# Patient Record
Sex: Male | Born: 1945 | Race: White | Hispanic: No | Smoking: Never smoker
Health system: Southern US, Community
[De-identification: ages and names within clinical notes are randomized; demographics above are authoritative.]

## PROBLEM LIST (undated history)

## (undated) DIAGNOSIS — G629 Polyneuropathy, unspecified: Secondary | ICD-10-CM

## (undated) DIAGNOSIS — G20 Parkinson's disease: Secondary | ICD-10-CM

## (undated) DIAGNOSIS — I679 Cerebrovascular disease, unspecified: Secondary | ICD-10-CM

## (undated) DIAGNOSIS — N183 Chronic kidney disease, stage 3 unspecified: Secondary | ICD-10-CM

## (undated) DIAGNOSIS — I482 Chronic atrial fibrillation, unspecified: Secondary | ICD-10-CM

## (undated) DIAGNOSIS — G250 Essential tremor: Secondary | ICD-10-CM

## (undated) DIAGNOSIS — G9341 Metabolic encephalopathy: Secondary | ICD-10-CM

## (undated) DIAGNOSIS — F319 Bipolar disorder, unspecified: Secondary | ICD-10-CM

## (undated) DIAGNOSIS — J9621 Acute and chronic respiratory failure with hypoxia: Secondary | ICD-10-CM

## (undated) DIAGNOSIS — J1282 Pneumonia due to coronavirus disease 2019: Secondary | ICD-10-CM

## (undated) DIAGNOSIS — E119 Type 2 diabetes mellitus without complications: Secondary | ICD-10-CM

## (undated) DIAGNOSIS — E785 Hyperlipidemia, unspecified: Secondary | ICD-10-CM

## (undated) DIAGNOSIS — N400 Enlarged prostate without lower urinary tract symptoms: Secondary | ICD-10-CM

## (undated) DIAGNOSIS — I471 Supraventricular tachycardia: Secondary | ICD-10-CM

## (undated) DIAGNOSIS — U071 COVID-19: Secondary | ICD-10-CM

## (undated) HISTORY — PX: OTHER SURGICAL HISTORY: SHX169

## (undated) HISTORY — PX: APPENDECTOMY: SHX54

---

## 2005-07-03 ENCOUNTER — Emergency Department: Payer: Self-pay | Admitting: Emergency Medicine

## 2010-09-27 ENCOUNTER — Emergency Department: Payer: Self-pay | Admitting: Emergency Medicine

## 2010-11-12 ENCOUNTER — Emergency Department: Payer: Self-pay | Admitting: Emergency Medicine

## 2012-04-24 ENCOUNTER — Emergency Department: Payer: Self-pay | Admitting: Emergency Medicine

## 2012-04-25 LAB — LITHIUM LEVEL: Lithium: <0.2 mmol/L — ABNORMAL LOW

## 2012-04-25 LAB — DRUG SCREEN, URINE
Amphetamines, Ur Screen: NEGATIVE (ref ?–1000)
Barbiturates, Ur Screen: NEGATIVE (ref ?–200)
Cocaine Metabolite,Ur ~~LOC~~: NEGATIVE (ref ?–300)
Opiate, Ur Screen: NEGATIVE (ref ?–300)
Tricyclic, Ur Screen: NEGATIVE (ref ?–1000)

## 2012-04-25 LAB — CBC
HCT: 39.2 % — ABNORMAL LOW (ref 40.0–52.0)
HGB: 13.2 g/dL (ref 13.0–18.0)
MCH: 32.8 pg (ref 26.0–34.0)
MCHC: 33.7 g/dL (ref 32.0–36.0)
RBC: 4.03 10*6/uL — ABNORMAL LOW (ref 4.40–5.90)
RDW: 12.6 % (ref 11.5–14.5)

## 2012-04-25 LAB — COMPREHENSIVE METABOLIC PANEL
Albumin: 3.8 g/dL (ref 3.4–5.0)
BUN: 7 mg/dL (ref 7–18)
Chloride: 103 mmol/L (ref 98–107)
Co2: 21 mmol/L (ref 21–32)
Creatinine: 0.67 mg/dL (ref 0.60–1.30)
EGFR (Non-African Amer.): >60
Glucose: 180 mg/dL — ABNORMAL HIGH (ref 65–99)

## 2012-04-25 LAB — ETHANOL: Ethanol %: 0.073 % (ref 0.000–0.080)

## 2012-04-25 LAB — ACETAMINOPHEN LEVEL: Acetaminophen: <2 ug/mL

## 2012-04-26 LAB — COMPREHENSIVE METABOLIC PANEL
Albumin: 3.5 g/dL (ref 3.4–5.0)
Chloride: 106 mmol/L (ref 98–107)
EGFR (Non-African Amer.): >60
SGOT(AST): 25 U/L (ref 15–37)
SGPT (ALT): 29 U/L

## 2012-04-26 LAB — CBC WITH DIFFERENTIAL/PLATELET
Basophil #: 0.1 10*3/uL (ref 0.0–0.1)
Eosinophil #: 0.6 10*3/uL (ref 0.0–0.7)
Eosinophil %: 5.1 %
HCT: 37.3 % — ABNORMAL LOW (ref 40.0–52.0)
HGB: 12.8 g/dL — ABNORMAL LOW (ref 13.0–18.0)
Lymphocyte #: 2.7 10*3/uL (ref 1.0–3.6)
Lymphocyte %: 25.1 %
MCH: 34.2 pg — ABNORMAL HIGH (ref 26.0–34.0)
MCHC: 34.4 g/dL (ref 32.0–36.0)
MCV: 99 fL (ref 80–100)
Monocyte #: 0.7 x10 3/mm (ref 0.2–1.0)
Monocyte %: 6.1 %
Neutrophil #: 6.9 10*3/uL — ABNORMAL HIGH (ref 1.4–6.5)
RBC: 3.76 10*6/uL — ABNORMAL LOW (ref 4.40–5.90)
WBC: 10.9 10*3/uL — ABNORMAL HIGH (ref 3.8–10.6)

## 2012-04-26 LAB — TROPONIN I
Troponin-I: <0.02 ng/mL
Troponin-I: <0.02 ng/mL

## 2012-04-28 LAB — LITHIUM LEVEL: Lithium: 0.65 mmol/L

## 2012-05-30 ENCOUNTER — Emergency Department: Payer: Self-pay | Admitting: Emergency Medicine

## 2012-05-30 LAB — COMPREHENSIVE METABOLIC PANEL
Alkaline Phosphatase: 75 U/L (ref 50–136)
BUN: 5 mg/dL — ABNORMAL LOW (ref 7–18)
Bilirubin,Total: 0.5 mg/dL (ref 0.2–1.0)
Co2: 23 mmol/L (ref 21–32)
Creatinine: 0.83 mg/dL (ref 0.60–1.30)
EGFR (African American): >60
EGFR (Non-African Amer.): >60
Glucose: 96 mg/dL (ref 65–99)
SGOT(AST): 21 U/L (ref 15–37)
SGPT (ALT): 23 U/L
Total Protein: 7.4 g/dL (ref 6.4–8.2)

## 2012-05-30 LAB — ETHANOL
Ethanol %: 0.1 % — ABNORMAL HIGH (ref 0.000–0.080)
Ethanol: 100 mg/dL

## 2012-05-30 LAB — SALICYLATE LEVEL: Salicylates, Serum: 3.4 mg/dL — ABNORMAL HIGH

## 2012-05-30 LAB — DRUG SCREEN, URINE
Barbiturates, Ur Screen: NEGATIVE (ref ?–200)
Benzodiazepine, Ur Scrn: NEGATIVE (ref ?–200)
Cannabinoid 50 Ng, Ur ~~LOC~~: NEGATIVE (ref ?–50)
Cocaine Metabolite,Ur ~~LOC~~: NEGATIVE (ref ?–300)
Methadone, Ur Screen: NEGATIVE (ref ?–300)
Phencyclidine (PCP) Ur S: NEGATIVE (ref ?–25)
Tricyclic, Ur Screen: NEGATIVE (ref ?–1000)

## 2012-05-30 LAB — CBC
MCHC: 34.3 g/dL (ref 32.0–36.0)
RDW: 12.7 % (ref 11.5–14.5)

## 2012-05-31 LAB — LITHIUM LEVEL: Lithium: 0.41 mmol/L — ABNORMAL LOW

## 2012-06-01 LAB — URINALYSIS, COMPLETE
Blood: NEGATIVE
Nitrite: NEGATIVE
Ph: 6 (ref 4.5–8.0)
Protein: NEGATIVE
WBC UR: 5 /HPF (ref 0–5)

## 2012-06-10 LAB — LITHIUM LEVEL: Lithium: 0.73 mmol/L

## 2012-08-23 ENCOUNTER — Ambulatory Visit: Payer: Self-pay | Admitting: Family Medicine

## 2012-12-24 ENCOUNTER — Emergency Department (HOSPITAL_COMMUNITY)
Admission: EM | Admit: 2012-12-24 | Discharge: 2012-12-24 | Disposition: A | Discharge status: Home / Self Care | Payer: Medicare Other | Attending: Emergency Medicine | Admitting: Emergency Medicine

## 2012-12-24 ENCOUNTER — Encounter (HOSPITAL_COMMUNITY): Payer: Self-pay | Admitting: *Deleted

## 2012-12-24 ENCOUNTER — Emergency Department (HOSPITAL_COMMUNITY): Payer: Medicare Other

## 2012-12-24 DIAGNOSIS — Y939 Activity, unspecified: Secondary | ICD-10-CM | POA: Insufficient documentation

## 2012-12-24 DIAGNOSIS — F17200 Nicotine dependence, unspecified, uncomplicated: Secondary | ICD-10-CM | POA: Insufficient documentation

## 2012-12-24 DIAGNOSIS — R05 Cough: Secondary | ICD-10-CM

## 2012-12-24 DIAGNOSIS — F319 Bipolar disorder, unspecified: Secondary | ICD-10-CM | POA: Insufficient documentation

## 2012-12-24 DIAGNOSIS — IMO0002 Reserved for concepts with insufficient information to code with codable children: Secondary | ICD-10-CM | POA: Insufficient documentation

## 2012-12-24 DIAGNOSIS — Y929 Unspecified place or not applicable: Secondary | ICD-10-CM | POA: Insufficient documentation

## 2012-12-24 DIAGNOSIS — S060X9A Concussion with loss of consciousness of unspecified duration, initial encounter: Secondary | ICD-10-CM | POA: Insufficient documentation

## 2012-12-24 DIAGNOSIS — J3489 Other specified disorders of nose and nasal sinuses: Secondary | ICD-10-CM | POA: Insufficient documentation

## 2012-12-24 DIAGNOSIS — R093 Abnormal sputum: Secondary | ICD-10-CM | POA: Insufficient documentation

## 2012-12-24 DIAGNOSIS — S298XXA Other specified injuries of thorax, initial encounter: Secondary | ICD-10-CM | POA: Insufficient documentation

## 2012-12-24 DIAGNOSIS — R0789 Other chest pain: Secondary | ICD-10-CM

## 2012-12-24 DIAGNOSIS — Z8719 Personal history of other diseases of the digestive system: Secondary | ICD-10-CM | POA: Insufficient documentation

## 2012-12-24 DIAGNOSIS — S0993XA Unspecified injury of face, initial encounter: Secondary | ICD-10-CM | POA: Insufficient documentation

## 2012-12-24 DIAGNOSIS — R059 Cough, unspecified: Secondary | ICD-10-CM | POA: Insufficient documentation

## 2012-12-24 DIAGNOSIS — Z79899 Other long term (current) drug therapy: Secondary | ICD-10-CM | POA: Insufficient documentation

## 2012-12-24 DIAGNOSIS — Z7982 Long term (current) use of aspirin: Secondary | ICD-10-CM | POA: Insufficient documentation

## 2012-12-24 HISTORY — DX: Essential tremor: G25.0

## 2012-12-24 HISTORY — DX: Bipolar disorder, unspecified: F31.9

## 2012-12-24 MED ORDER — AZITHROMYCIN 250 MG PO TABS: 250.0000 mg | ORAL_TABLET | Freq: Every day | ORAL | Status: AC

## 2012-12-24 MED ORDER — HYDROCODONE-HOMATROPINE 5-1.5 MG/5ML PO SYRP: 5.0000 mL | ORAL_SOLUTION | Freq: Four times a day (QID) | ORAL | Status: DC | PRN

## 2012-12-24 MED ORDER — AZITHROMYCIN 250 MG PO TABS
500.0000 mg | ORAL_TABLET | Freq: Once | ORAL | Status: DC
  Filled 2012-12-24 (×2): qty 2
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
## 2012-12-24 NOTE — ED Notes (Signed)
Pt states productive cough x 1 week. States pain to left rib area. ALso states he has been having headaches which is new for him.

## 2014-04-04 ENCOUNTER — Inpatient Hospital Stay: Payer: Self-pay | Admitting: Psychiatry

## 2014-04-04 LAB — ETHANOL: Ethanol %: <0.003 % (ref 0.000–0.080)

## 2014-04-04 LAB — COMPREHENSIVE METABOLIC PANEL
ALBUMIN: 3.4 g/dL (ref 3.4–5.0)
ALK PHOS: 66 U/L
ANION GAP: 5 — AB (ref 7–16)
BUN: 9 mg/dL (ref 7–18)
Bilirubin,Total: 0.2 mg/dL (ref 0.2–1.0)
CALCIUM: 8.9 mg/dL (ref 8.5–10.1)
CHLORIDE: 107 mmol/L (ref 98–107)
CO2: 26 mmol/L (ref 21–32)
CREATININE: 0.9 mg/dL (ref 0.60–1.30)
EGFR (Non-African Amer.): >60
GLUCOSE: 128 mg/dL — AB (ref 65–99)
Osmolality: 276 (ref 275–301)
POTASSIUM: 3.7 mmol/L (ref 3.5–5.1)
SGOT(AST): 32 U/L (ref 15–37)
SGPT (ALT): 28 U/L (ref 12–78)
Sodium: 138 mmol/L (ref 136–145)
Total Protein: 7.2 g/dL (ref 6.4–8.2)

## 2014-04-04 LAB — SALICYLATE LEVEL
SALICYLATES, SERUM: 5.4 mg/dL — AB
Salicylates, Serum: 10.4 mg/dL — ABNORMAL HIGH

## 2014-04-04 LAB — DRUG SCREEN, URINE
Amphetamines, Ur Screen: NEGATIVE (ref ?–1000)
BARBITURATES, UR SCREEN: NEGATIVE (ref ?–200)
BENZODIAZEPINE, UR SCRN: NEGATIVE (ref ?–200)
Cannabinoid 50 Ng, Ur ~~LOC~~: NEGATIVE (ref ?–50)
Cocaine Metabolite,Ur ~~LOC~~: NEGATIVE (ref ?–300)
MDMA (ECSTASY) UR SCREEN: NEGATIVE (ref ?–500)
Methadone, Ur Screen: NEGATIVE (ref ?–300)
Opiate, Ur Screen: NEGATIVE (ref ?–300)
Phencyclidine (PCP) Ur S: NEGATIVE (ref ?–25)
TRICYCLIC, UR SCREEN: NEGATIVE (ref ?–1000)

## 2014-04-04 LAB — ACETAMINOPHEN LEVEL

## 2014-04-04 LAB — TSH: Thyroid Stimulating Horm: 0.45 u[IU]/mL

## 2014-04-04 LAB — CBC
HCT: 37.3 % — ABNORMAL LOW (ref 40.0–52.0)
HGB: 12.4 g/dL — ABNORMAL LOW (ref 13.0–18.0)
MCH: 31.9 pg (ref 26.0–34.0)
MCHC: 33.3 g/dL (ref 32.0–36.0)
MCV: 96 fL (ref 80–100)
Platelet: 278 10*3/uL (ref 150–440)
RBC: 3.9 10*6/uL — ABNORMAL LOW (ref 4.40–5.90)
RDW: 13 % (ref 11.5–14.5)
WBC: 11.9 10*3/uL — ABNORMAL HIGH (ref 3.8–10.6)

## 2014-04-04 LAB — LITHIUM LEVEL: Lithium: 0.79 mmol/L

## 2014-04-07 LAB — LITHIUM LEVEL: Lithium: 0.54 mmol/L — ABNORMAL LOW

## 2014-04-15 LAB — LITHIUM LEVEL: LITHIUM: 1.33 mmol/L — AB

## 2014-04-16 LAB — LITHIUM LEVEL: Lithium: 0.53 mmol/L — ABNORMAL LOW
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
## 2015-03-16 NOTE — Discharge Summary (Signed)
PATIENT NAME:  Kyle [name], Kyle [name] MR#:  [name] DATE OF BIRTH:  [birth year]  DATE OF ADMISSION:  04/04/2014 DATE OF DISCHARGE:  04/18/2014  DATE OF CONSULTATION:  04/18/2014.   HOSPITAL COURSE:  See dictated history and physical for details of admission. This 69 year old man with a history of bipolar disorder was petitioned to the Emergency Room by his family because they reported he had started having more psychotic symptoms and agitation. When first seen in the Emergency Room, the patient had multiple symptoms of mania including grandiosity, hyperverbality, disorganized thinking. He was admitted to the hospital and treated with medication, as well as individual and group psychotherapy. His insight remained poor for the most part, as he did not see that he ever needed to be in the hospital. He persisted in stating that he intended to leave his wife, but gradually showed resolution of his other symptoms. He showed resolution of his grandiosity. Hypersexuality toned down and then disappeared. He is no longer hyperverbal or pressured and not making bizarre statements. He has been compliant with medication, although he was disagreeable about his hospital stay at times. He has not been violent or aggressive. I have tried my best to counsel him about what I have seen in him that suggests a diagnosis of mania. Gradually, I think his insight has improved. At this time, he is still saying that he thinks his relationship with his life is over, but he is willing to go back home to [place] for the time being and will follow up with his outpatient provider, [facility]. We have largely kept him on his lithium and Seroquel that he had been taking on admission. He briefly had an elevated lithium level at 1.3 resulting in backing down off the dose. Now, he is no longer having any side effects of toxicity.   MENTAL STATUS EXAMINATION AT DISCHARGE: Neatly dressed and groomed man, looks his stated age, cooperative  with the interview. Eye contact good. Psychomotor activity calm. Speech normal rate, tone and volume. Affect a little blunted. Mood stated as being fine. Thoughts are normal in rate; not racing, not grandiose. No bizarre thinking. Denies hallucinations. Denies suicidal or homicidal ideation. Alert and oriented x 4. Judgment and insight improved. Short and long-term memory intact.   DISCHARGE MEDICATIONS:  Propranolol 60 mg once a day, Zyrtec 10 mg once a day, lithium 600 mg at night, quetiapine 600 mg at night, docusate 200 mg twice a day, MiraLAX 17 grams once a day.   LABORATORY RESULTS:  The most recent lithium level was on the 25th and was 0.53. That was the day after we had stopped lithium. It has been restarted since then. He had a top lithium level of 1.3 on May 24th.  Admission labs included drug screen negative. Lithium level 0.79. TSH 0.45. Alcohol negative. Chemistry panel: Slightly elevated glucose of 128. Hematology panel: Elevated white count of 11.9, low hemoglobin and hematocrit.   DISPOSITION:  [place] back to his home [place]. Follow up with [facility].   DIAGNOSIS, PRINCIPAL AND PRIMARY:  AXIS I:  Bipolar disorder type I, manic.   SECONDARY DIAGNOSES: AXIS I:  No further.  AXIS II:  No diagnosis.  AXIS III:  Seasonal allergies, constipation, chronic tremor.  AXIS IV:  Moderate to severe from stress within his family.  AXIS V:  Functioning at time of discharge 56.     ____________________________ Audery AmelJohn T. Ginette Bradway, MD jtc:dmm [name]: 04/18/2014 14:59:24 ET T: 04/18/2014 22:00:41 ET JOB#: [id number]  cc: Audery Amel, MD, <Dictator> Audery Amel MD ELECTRONICALLY SIGNED 05/09/2014 13:20
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
## 2015-04-16 ENCOUNTER — Other Ambulatory Visit: Payer: Self-pay

## 2015-04-16 ENCOUNTER — Encounter: Payer: Self-pay | Admitting: Emergency Medicine

## 2015-04-16 ENCOUNTER — Emergency Department: Payer: Medicare Other

## 2015-04-16 ENCOUNTER — Emergency Department: Payer: Medicare Other | Attending: Student

## 2015-04-16 ENCOUNTER — Emergency Department
Admission: EM | Admit: 2015-04-16 | Discharge: 2015-04-18 | Disposition: A | Discharge status: Other Acute Inpatient Hospital | Payer: Self-pay | Attending: Student | Admitting: Student

## 2015-04-16 DIAGNOSIS — F3112 Bipolar disorder, current episode manic without psychotic features, moderate: Secondary | ICD-10-CM | POA: Insufficient documentation

## 2015-04-16 DIAGNOSIS — G8929 Other chronic pain: Secondary | ICD-10-CM | POA: Insufficient documentation

## 2015-04-16 DIAGNOSIS — I10 Essential (primary) hypertension: Secondary | ICD-10-CM

## 2015-04-16 DIAGNOSIS — M549 Dorsalgia, unspecified: Secondary | ICD-10-CM | POA: Insufficient documentation

## 2015-04-16 LAB — CBC WITH DIFFERENTIAL/PLATELET
BASOS ABS: 0 10*3/uL (ref 0–0.1)
Basophils Relative: 0 %
Eosinophils Absolute: 0 10*3/uL (ref 0–0.7)
Eosinophils Relative: 0 %
HCT: 39.5 % — ABNORMAL LOW (ref 40.0–52.0)
HEMOGLOBIN: 13.6 g/dL (ref 13.0–18.0)
LYMPHS ABS: 1.9 10*3/uL (ref 1.0–3.6)
LYMPHS PCT: 13 %
MCH: 32.7 pg (ref 26.0–34.0)
MCHC: 34.4 g/dL (ref 32.0–36.0)
MCV: 95 fL (ref 80.0–100.0)
Monocytes Absolute: 1.3 10*3/uL — ABNORMAL HIGH (ref 0.2–1.0)
Monocytes Relative: 9 %
Neutro Abs: 11.1 10*3/uL — ABNORMAL HIGH (ref 1.4–6.5)
Neutrophils Relative %: 78 %
Platelets: 262 10*3/uL (ref 150–440)
RBC: 4.15 MIL/uL — ABNORMAL LOW (ref 4.40–5.90)
RDW: 12.9 % (ref 11.5–14.5)
WBC: 14.4 10*3/uL — ABNORMAL HIGH (ref 3.8–10.6)

## 2015-04-16 LAB — COMPREHENSIVE METABOLIC PANEL
ALT: 50 U/L (ref 17–63)
AST: 66 U/L — ABNORMAL HIGH (ref 15–41)
Albumin: 4.3 g/dL (ref 3.5–5.0)
Alkaline Phosphatase: 66 U/L (ref 38–126)
Anion gap: 8 (ref 5–15)
BILIRUBIN TOTAL: 0.5 mg/dL (ref 0.3–1.2)
BUN: 12 mg/dL (ref 6–20)
CALCIUM: 9 mg/dL (ref 8.9–10.3)
CO2: 26 mmol/L (ref 22–32)
CREATININE: 0.71 mg/dL (ref 0.61–1.24)
Chloride: 104 mmol/L (ref 101–111)
Glucose, Bld: 119 mg/dL — ABNORMAL HIGH (ref 65–99)
Potassium: 3 mmol/L — ABNORMAL LOW (ref 3.5–5.1)
Sodium: 138 mmol/L (ref 135–145)
Total Protein: 7.5 g/dL (ref 6.5–8.1)

## 2015-04-16 LAB — URINE DRUG SCREEN, QUALITATIVE (ARMC ONLY)
Amphetamines, Ur Screen: NOT DETECTED
BENZODIAZEPINE, UR SCRN: NOT DETECTED
Barbiturates, Ur Screen: NOT DETECTED
CANNABINOID 50 NG, UR ~~LOC~~: NOT DETECTED
Cocaine Metabolite,Ur ~~LOC~~: NOT DETECTED
MDMA (Ecstasy)Ur Screen: NOT DETECTED
Methadone Scn, Ur: NOT DETECTED
OPIATE, UR SCREEN: NOT DETECTED
Phencyclidine (PCP) Ur S: NOT DETECTED
TRICYCLIC, UR SCREEN: NOT DETECTED

## 2015-04-16 LAB — TROPONIN I: Troponin I: <0.03 ng/mL (ref <0.031)

## 2015-04-16 LAB — ETHANOL: Alcohol, Ethyl (B): <5 mg/dL (ref <5)

## 2015-04-16 MED ORDER — DIPHENHYDRAMINE HCL 50 MG PO CAPS
50.0000 mg | ORAL_CAPSULE | Freq: Once | ORAL | Status: AC
  Administered 2015-04-16: 50 mg via ORAL

## 2015-04-16 MED ORDER — ACETAMINOPHEN 325 MG PO TABS
ORAL_TABLET | ORAL | Status: AC
  Administered 2015-04-16: 650 mg via ORAL
  Filled 2015-04-16: qty 2

## 2015-04-16 MED ORDER — POTASSIUM CHLORIDE 20 MEQ PO PACK
20.0000 meq | PACK | Freq: Once | ORAL | Status: AC
  Administered 2015-04-16: 20 meq via ORAL

## 2015-04-16 MED ORDER — ACETAMINOPHEN 325 MG PO TABS
650.0000 mg | ORAL_TABLET | Freq: Once | ORAL | Status: AC
  Administered 2015-04-16: 650 mg via ORAL

## 2015-04-16 MED ORDER — DIPHENHYDRAMINE HCL 50 MG PO CAPS
ORAL_CAPSULE | ORAL | Status: AC
  Administered 2015-04-16: 50 mg via ORAL
  Filled 2015-04-16: qty 1

## 2015-04-16 MED ORDER — POTASSIUM CHLORIDE 20 MEQ PO PACK
PACK | ORAL | Status: AC
  Administered 2015-04-16: 20 meq via ORAL
  Filled 2015-04-16: qty 1
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
## 2015-04-16 NOTE — ED Notes (Signed)
BEHAVIORAL HEALTH ROUNDING Patient sleeping: No. Patient alert and oriented: yes Behavior appropriate: Yes.  ;  Nutrition and fluids offered: Yes  Toileting and hygiene offered: Yes  Sitter present: yes Law enforcement present: Yes  

## 2015-04-16 NOTE — ED Notes (Signed)
BEHAVIORAL HEALTH ROUNDING Patient sleeping: No. Patient alert and oriented: yes Behavior appropriate: Yes.   Nutrition and fluids offered: Yes  Toileting and hygiene offered: Yes  Sitter present: yes Law enforcement present: Yes   ENVIRONMENTAL ASSESSMENT Potentially harmful objects out of patient reach: Yes.   Personal belongings secured: Yes.   Patient dressed in hospital provided attire only: Yes.   Plastic bags out of patient reach: Yes.   Patient care equipment (cords, cables, call bells, lines, and drains) shortened, removed, or accounted for: Yes.   Equipment and supplies removed from bottom of stretcher: Yes.   Potentially toxic materials out of patient reach: Yes.   Sharps container removed or out of patient reach: Yes.     

## 2015-04-16 NOTE — ED Notes (Signed)
Pt given food tray.

## 2015-04-16 NOTE — ED Notes (Signed)

## 2015-04-16 NOTE — ED Notes (Signed)
[facility], care assumed. Pt laying in bed requesting tylenol pm states normally takes it at home for sleep. Sitter doing q 15 min checks will continue to monitor.
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
## 2015-04-17 DIAGNOSIS — I10 Essential (primary) hypertension: Secondary | ICD-10-CM

## 2015-04-17 LAB — URINALYSIS COMPLETE WITH MICROSCOPIC (ARMC ONLY)
BILIRUBIN URINE: NEGATIVE
Glucose, UA: 50 mg/dL — AB
HGB URINE DIPSTICK: NEGATIVE
NITRITE: NEGATIVE
PH: 5 (ref 5.0–8.0)
PROTEIN: 30 mg/dL — AB
Specific Gravity, Urine: 1.028 (ref 1.005–1.030)
Squamous Epithelial / LPF: NONE SEEN

## 2015-04-17 MED ORDER — LITHIUM CARBONATE 300 MG PO CAPS
600.0000 mg | ORAL_CAPSULE | Freq: Every day | ORAL | Status: DC
  Filled 2015-04-17 (×2): qty 2

## 2015-04-17 MED ORDER — QUETIAPINE FUMARATE 25 MG PO TABS
ORAL_TABLET | ORAL | Status: AC
  Administered 2015-04-17: 100 mg via ORAL
  Filled 2015-04-17: qty 4

## 2015-04-17 MED ORDER — LITHIUM CARBONATE ER 300 MG PO TBCR
EXTENDED_RELEASE_TABLET | ORAL | Status: AC
Start: 2015-04-17 — End: 2015-04-17
  Administered 2015-04-17: 600 mg
  Filled 2015-04-17: qty 2

## 2015-04-17 MED ORDER — LORAZEPAM 1 MG PO TABS
ORAL_TABLET | ORAL | Status: AC
Start: 2015-04-17 — End: 2015-04-17
  Administered 2015-04-17: 1 mg via ORAL
  Filled 2015-04-17: qty 1

## 2015-04-17 MED ORDER — LORAZEPAM 1 MG PO TABS
1.0000 mg | ORAL_TABLET | Freq: Once | ORAL | Status: AC
  Administered 2015-04-17: 1 mg via ORAL

## 2015-04-17 MED ORDER — QUETIAPINE FUMARATE 25 MG PO TABS
100.0000 mg | ORAL_TABLET | Freq: Every day | ORAL | Status: DC
  Administered 2015-04-17: 100 mg via ORAL

## 2015-04-17 NOTE — ED Notes (Signed)
Pt in shower.  

## 2015-04-17 NOTE — ED Notes (Signed)

## 2015-04-17 NOTE — ED Notes (Signed)
Breakfast tray given. Pt sitting up eating at this time.

## 2015-04-17 NOTE — ED Notes (Signed)
Patient assigned to appropriate care area. Patient oriented to unit/care area: Informed that, for their safety, care areas are designed for safety and monitored by security cameras at all times; and visiting hours explained to patient. Patient verbalizes understanding, and verbal contract for safety obtained. 

## 2015-04-17 NOTE — ED Notes (Signed)

## 2015-04-17 NOTE — ED Notes (Signed)
Pt given meal tray.

## 2015-04-17 NOTE — ED Notes (Signed)
Pt laying in bed.  

## 2015-04-17 NOTE — ED Notes (Signed)
meds given per md order  

## 2015-04-17 NOTE — ED Notes (Signed)
Pt with Dr. Clapacs at this time  

## 2015-04-17 NOTE — ED Notes (Signed)
Pt walking around the room, states still unable to sleep, will notify MD.

## 2015-04-17 NOTE — ED Notes (Addendum)
[facility]  Pt in room watching tv, co chronic back pain rates it 10/10.  No obvious distress noted at this time pt calm and cooperative.  Rounder doing q15 min rounds.

## 2015-04-17 NOTE — ED Notes (Signed)
BEHAVIORAL HEALTH ROUNDING Patient sleeping: No. Patient alert and oriented: yes Behavior appropriate: Yes.  ; If no, describe:  Nutrition and fluids offered: Yes Toileting and hygiene offered: Yes  Sitter present: yes Law enforcement present: Yes ODS  

## 2015-04-17 NOTE — ED Notes (Signed)

## 2015-04-17 NOTE — ED Notes (Signed)

## 2015-04-17 NOTE — ED Notes (Signed)
BEHAVIORAL HEALTH ROUNDING Patient sleeping: No. Patient alert and oriented: yes Behavior appropriate: Yes.  ;  Nutrition and fluids offered: Yes  Toileting and hygiene offered: Yes  Sitter present: Q15 minute checks and rounding  Law enforcement present: Yes   

## 2015-04-17 NOTE — ED Notes (Signed)
No change in condition will continue to monitor  

## 2015-04-17 NOTE — Consult Note (Signed)
  Due to overcrowding in ER, patient is reconsidered for admission to [facility]. Despite his age, this patient is in good physical condition requiring no special nursing care or observation beyond what his mental health requires. Therefore I have placed admission orders for him rather than waiting for referral to gero.

## 2015-04-17 NOTE — ED Notes (Signed)
BEHAVIORAL HEALTH ROUNDING Patient sleeping: No. Patient alert and oriented: yes Behavior appropriate: Yes.  ;  Nutrition and fluids offered: Yes  Toileting and hygiene offered: Yes  Sitter present:Q15mins checks  Law enforcement present: Yes  

## 2015-04-17 NOTE — ED Notes (Signed)
Pt showered

## 2015-04-17 NOTE — ED Notes (Signed)
Pt  Seen  By  Dr  Toni Amendlapacs  On 04/17/15   Refer  To geriatric psych  All  ivc  Paperwork  On  chart

## 2015-04-17 NOTE — ED Notes (Signed)
Transfer to BHU  

## 2015-04-17 NOTE — ED Notes (Addendum)
BEHAVIORAL HEALTH ROUNDING Patient sleeping: No. Patient alert and oriented: yes Behavior appropriate: Yes.  ;  Nutrition and fluids offered: Yes  Toileting and hygiene offered: Yes  Sitter present:Q15mins checks  Law enforcement present: Yes  

## 2015-04-17 NOTE — ED Notes (Signed)
Pt eating lunch
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
## 2015-04-17 NOTE — ED Notes (Signed)
ED BHU PLACEMENT JUSTIFICATION Is the patient under IVC or is there intent for IVC: Yes.   Is the patient medically cleared: Yes.   Is there vacancy in the ED BHU: Yes.   Is the population mix appropriate for patient: Yes.   Is the patient awaiting placement in inpatient or outpatient setting: Yes.   Has the patient had a psychiatric consult: Yes.   Survey of unit performed for contraband, proper placement and condition of furniture, tampering with fixtures in bathroom, shower, and each patient room: Yes.  ; Findings:  APPEARANCE/BEHAVIOR calm, cooperative and adequate rapport can be established NEURO ASSESSMENT Orientation: time, place and person Hallucinations: No.None noted (Hallucinations) Speech: Normal Gait: normal RESPIRATORY ASSESSMENT Normal expansion.  Clear to auscultation.  No rales, rhonchi, or wheezing. CARDIOVASCULAR ASSESSMENT regular rate and rhythm, S1, S2 normal, no murmur, click, rub or gallop GASTROINTESTINAL ASSESSMENT soft, nontender, BS WNL, no r/g EXTREMITIES normal strength, tone, and muscle mass PLAN OF CARE Provide calm/safe environment. Vital signs assessed twice daily. ED BHU Assessment once each 12-hour shift. Collaborate with intake RN daily or as condition indicates. Assure the ED provider has rounded once each shift. Provide and encourage hygiene. Provide redirection as needed. Assess for escalating behavior; address immediately and inform ED provider.  Assess family dynamic and appropriateness for visitation as needed: Yes.  ; If necessary, describe findings:  Educate the patient/family about BHU procedures/visitation: Yes.  ; If necessary, describe findings:  
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
## 2015-04-18 ENCOUNTER — Inpatient Hospital Stay
Admission: EM | Admit: 2015-04-18 | Discharge: 2015-04-30 | DRG: 885 | Disposition: A | Discharge status: Home / Self Care | Payer: Medicare Other | Source: Intra-hospital | Attending: Psychiatry | Admitting: Psychiatry

## 2015-04-18 DIAGNOSIS — G8929 Other chronic pain: Secondary | ICD-10-CM | POA: Diagnosis present

## 2015-04-18 DIAGNOSIS — G4700 Insomnia, unspecified: Secondary | ICD-10-CM | POA: Diagnosis present

## 2015-04-18 DIAGNOSIS — T43591A Poisoning by other antipsychotics and neuroleptics, accidental (unintentional), initial encounter: Secondary | ICD-10-CM | POA: Diagnosis present

## 2015-04-18 DIAGNOSIS — F419 Anxiety disorder, unspecified: Secondary | ICD-10-CM | POA: Diagnosis present

## 2015-04-18 DIAGNOSIS — I10 Essential (primary) hypertension: Secondary | ICD-10-CM | POA: Diagnosis present

## 2015-04-18 DIAGNOSIS — R451 Restlessness and agitation: Secondary | ICD-10-CM | POA: Diagnosis present

## 2015-04-18 DIAGNOSIS — F3112 Bipolar disorder, current episode manic without psychotic features, moderate: Secondary | ICD-10-CM | POA: Diagnosis present

## 2015-04-18 DIAGNOSIS — M545 Low back pain: Secondary | ICD-10-CM | POA: Diagnosis present

## 2015-04-18 DIAGNOSIS — G250 Essential tremor: Secondary | ICD-10-CM | POA: Diagnosis present

## 2015-04-18 DIAGNOSIS — T56891A Toxic effect of other metals, accidental (unintentional), initial encounter: Secondary | ICD-10-CM

## 2015-04-18 MED ORDER — QUETIAPINE FUMARATE 100 MG PO TABS
150.0000 mg | ORAL_TABLET | Freq: Every day | ORAL | Status: DC
  Administered 2015-04-18: 150 mg via ORAL
  Filled 2015-04-18: qty 2

## 2015-04-18 MED ORDER — QUETIAPINE FUMARATE 100 MG PO TABS: 100.0000 mg | ORAL_TABLET | Freq: Every day | ORAL | Status: DC

## 2015-04-18 MED ORDER — ZOLPIDEM TARTRATE 5 MG PO TABS: 10.0000 mg | ORAL_TABLET | Freq: Every evening | ORAL | Status: DC | PRN

## 2015-04-18 MED ORDER — MAGNESIUM HYDROXIDE 400 MG/5ML PO SUSP: 30.0000 mL | Freq: Every day | ORAL | Status: DC | PRN

## 2015-04-18 MED ORDER — ALUM & MAG HYDROXIDE-SIMETH 200-200-20 MG/5ML PO SUSP: 30.0000 mL | ORAL | Status: DC | PRN

## 2015-04-18 MED ORDER — LORAZEPAM 2 MG PO TABS: 2.0000 mg | ORAL_TABLET | ORAL | Status: DC | PRN

## 2015-04-18 MED ORDER — AMLODIPINE BESYLATE 5 MG PO TABS
5.0000 mg | ORAL_TABLET | Freq: Every day | ORAL | Status: DC
  Administered 2015-04-18: 5 mg via ORAL
  Filled 2015-04-18: qty 1

## 2015-04-18 MED ORDER — LITHIUM CARBONATE 300 MG PO CAPS: 600.0000 mg | ORAL_CAPSULE | Freq: Every day | ORAL | Status: DC

## 2015-04-18 MED ORDER — LITHIUM CARBONATE ER 300 MG PO TBCR
600.0000 mg | EXTENDED_RELEASE_TABLET | Freq: Every day | ORAL | Status: DC
  Administered 2015-04-18 – 2015-04-26 (×9): 600 mg via ORAL
  Filled 2015-04-18 (×12): qty 2

## 2015-04-18 MED ORDER — LORAZEPAM 2 MG PO TABS
2.0000 mg | ORAL_TABLET | Freq: Three times a day (TID) | ORAL | Status: DC | PRN
  Administered 2015-04-21 – 2015-04-27 (×2): 2 mg via ORAL
  Filled 2015-04-18 (×2): qty 1

## 2015-04-18 MED ORDER — ZOLPIDEM TARTRATE 5 MG PO TABS
5.0000 mg | ORAL_TABLET | Freq: Every evening | ORAL | Status: DC | PRN
  Administered 2015-04-21 – 2015-04-22 (×2): 5 mg via ORAL
  Filled 2015-04-18 (×2): qty 1

## 2015-04-18 MED ORDER — ACETAMINOPHEN 325 MG PO TABS
650.0000 mg | ORAL_TABLET | Freq: Four times a day (QID) | ORAL | Status: DC | PRN
  Administered 2015-04-20 – 2015-04-27 (×4): 650 mg via ORAL
  Filled 2015-04-18 (×5): qty 2

## 2015-04-18 MED ORDER — QUETIAPINE FUMARATE 25 MG PO TABS
25.0000 mg | ORAL_TABLET | Freq: Every day | ORAL | Status: DC
  Administered 2015-04-18: 25 mg via ORAL
  Filled 2015-04-18: qty 1

## 2015-04-18 MED ORDER — HYDRALAZINE HCL 25 MG PO TABS: 25.0000 mg | ORAL_TABLET | Freq: Three times a day (TID) | ORAL | Status: DC | PRN

## 2015-04-18 NOTE — Plan of Care (Signed)
Problem: Alteration in mood Goal: STG-Patient is able to discuss feelings and issues (Patient is able to discuss feelings and issues leading to depression)  Outcome: Progressing Denies SI
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
## 2015-04-18 NOTE — ED Notes (Signed)

## 2015-04-18 NOTE — [facility] Group Notes (Signed)
[facility] Group Notes:  (Nursing/MHT/Case Management/Adjunct)  Date:  04/18/2015  Time:  3:36 PM  Type of Therapy:  Group Therapy  Participation Level:  Did Not Attend  Kyle [name] 04/18/2015, 3:36 PM 

## 2015-04-18 NOTE — Tx Team (Signed)
Initial Interdisciplinary Treatment Plan   PATIENT STRESSORS: Financial difficulties Legal issue Medication change or noncompliance   PATIENT STRENGTHS: Active sense of humor Capable of independent living Motivation for treatment/growth   PROBLEM LIST: Problem List/Patient Goals Date to be addressed Date deferred Reason deferred Estimated date of resolution  Depression 04-18-15           Mania 04-18-15                                          DISCHARGE CRITERIA:  Ability to meet basic life and health needs Improved stabilization in mood, thinking, and/or behavior Motivation to continue treatment in a less acute level of care  PRELIMINARY DISCHARGE PLAN: Attend aftercare/continuing care group Outpatient therapy  PATIENT/FAMIILY INVOLVEMENT: This treatment plan has been presented to and reviewed with the patient, Suezanne JacquetMarvin D Sergent, and/or family member.  The patient and family have been given the opportunity to ask questions and make suggestions.  Larey SeatLynda Parham Rhianna Raulerson 04/18/2015, 5:43 AM

## 2015-04-18 NOTE — Progress Notes (Signed)
Presents with sad blunted affect. Easily turned on the unit requiring frequent redirection.  Seclusive to room except for meals and medication administration.

## 2015-04-18 NOTE — ED Notes (Signed)
Patient observed with no unusual behavior or acute distress. Patient with no verbalized needs or c/o at this time.... will continue to monitor and follow up as needed. Security staff monitoring patient on [employer] system.  

## 2015-04-18 NOTE — Progress Notes (Signed)
Recreation Therapy Notes  Date: 05.26.16 Time: 3:00 pm Location: Craft Room  Group Topic: Leisure Education  Goal Area(s) Addresses:  Patient will write at least one healthy leisure activity. Patient will write his/her leisure goal for next year.  Behavioral Response: Arrived late, Attentive, Interactive  Intervention: Leisure AstronomerBucket List  Activity: Patients were given a Leisure Geneticist, molecularBucket List worksheet and instructed to fill out as many healthy leisure activities that they wanted to participate in, but had not had the chance to yet. Patients were instructed to list a leisure goal for next year.  Education: LRT educated patient on leisure and what is needed to participate in leisure.  Education Outcome: Acknowledges education/In group clarification offered   Clinical Observations/Feedback: Patient arrived to group at approximately 3:22 pm. Patient completed worksheet. Patient wrote down a leisure goal for next year. Patient contributed to group discussion by stating some activities he had on his leisure bucket list, and what he needed to participate in leisure, what emotions he got out of participating in leisure, and how he can put leisure back into his schedule.   Jacquelynn CreeGreene,Omarian Jaquith M, LRT/CTRS 04/18/2015 4:24 PM

## 2015-04-18 NOTE — Progress Notes (Signed)
Recreation Therapy Notes  INPATIENT RECREATION THERAPY ASSESSMENT  Patient Details Name: Kyle [name] MRN: [id number] DOB: 01/22/1946 Today's Date: 04/18/2015  Patient Stressors: Other (Comment) (Ex-wife)  Coping Skills:   Exercise, Art/Dance, Music, Sports  Personal Challenges: Stress Management  Leisure Interests (2+):  Individual - Other (Comment) (Laugh, "funny walk")  Awareness of Community Resources:  Yes  Community Resources:  [employer], Other (Comment) Armed forces logistics/support/administrative officer([facility])  Current Use: Yes  If no, Barriers?:    Patient Strengths:  Personality, standing up for all the bad things  Patient Identified Areas of Improvement:  Being happy again, surrogate women and children, antique dolls  Current Recreation Participation:  Talk to buddies and play music  Patient Goal for Hospitalization:  Try to make a difference in someone else's life  [place]  [place]  [place] Residence:  [place]   Current SI (including self-harm):  No  Current HI:  No  Consent to Intern Participation: N/A   Jacquelynn CreeGreene,Hayat Warbington M, LRT/CTRS 04/18/2015, 2:23 PM

## 2015-04-18 NOTE — ED Notes (Signed)
Pt laying in bed no distress noted  

## 2015-04-18 NOTE — [facility] Suicide Risk Assessment (Signed)
[facility]   Nursing information obtained from:  Patient Demographic factors:  Age 69 or older Current Mental Status:  NA Loss Factors:  NA Historical Factors:  Family history of mental illness or substance abuse Risk Reduction Factors:  NA Total Time spent with patient: 1 hour Principal Problem: Bipolar 1 disorder, manic, moderate Diagnosis:   Patient Active Problem List   Diagnosis Date Noted  . Bipolar 1 disorder, manic, moderate [F31.12] 04/17/2015  . Hypertension [I10] 04/17/2015     Continued Clinical Symptoms:  Alcohol Use Disorder Identification Test Final Score (AUDIT): 0 The "Alcohol Use Disorders Identification Test", Guidelines for Use in Primary Care, Second Edition.  World Science writerHealth Organization [facility](WHO). Score between 0-7:  no or low risk or alcohol related problems. Score between 8-15:  moderate risk of alcohol related problems. Score between 16-19:  high risk of alcohol related problems. Score 20 or above:  warrants further diagnostic evaluation for alcohol dependence and treatment.   CLINICAL FACTORS:  Bipolar disorder current episode manic     Psychiatric Specialty Exam: Physical Exam  Review of Systems  Constitutional: Negative.   HENT: Negative.   Eyes: Negative.   Respiratory: Negative.   Cardiovascular: Negative.   Gastrointestinal: Negative.   Genitourinary: Negative.   Musculoskeletal: Positive for back pain.  Skin: Negative.   Neurological: Negative.   Endo/Heme/Allergies: Negative.   Psychiatric/Behavioral: The patient has insomnia.     Blood pressure 155/75, pulse 91, temperature 97.6 F (36.4 C), temperature source Oral, resp. rate 20, height 5\' 9"  (1.753 m), weight 95.255 kg (210 lb).Body mass index is 31 kg/(m^2).                                                         COGNITIVE FEATURES THAT CONTRIBUTE TO RISK:  None    SUICIDE RISK:   Moderate:  Frequent suicidal ideation with  limited intensity, and duration, some specificity in terms of plans, no associated intent, good self-control, limited dysphoria/symptomatology, some risk factors present, and identifiable protective factors, including available and accessible social support.  PLAN OF CARE: Admit to behavioral health  Medical Decision Making:  Established Problem, Worsening (2)  I certify that inpatient services furnished can reasonably be expected to improve the patient's condition.   Jimmy FootmanHernandez-Gonzalez,  Brentley Horrell 04/18/2015, 10:27 AM

## 2015-04-18 NOTE — ED Notes (Signed)
Report called to receiving RN . Patient and family informed of room assignment, and plan for transfer. Patient in stable condition. Preparing for transfer 
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
## 2015-04-19 LAB — LIPID PANEL
Cholesterol: 153 mg/dL (ref 0–200)
HDL: 49 mg/dL
LDL Cholesterol: 93 mg/dL (ref 0–99)
Total CHOL/HDL Ratio: 3.1 ratio
Triglycerides: 55 mg/dL
VLDL: 11 mg/dL (ref 0–40)

## 2015-04-19 LAB — HEMOGLOBIN A1C: Hgb A1c MFr Bld: 5.4 % (ref 4.0–6.0)

## 2015-04-19 LAB — TSH: TSH: 1.143 u[IU]/mL (ref 0.350–4.500)

## 2015-04-19 MED ORDER — AMLODIPINE BESYLATE 10 MG PO TABS
10.0000 mg | ORAL_TABLET | Freq: Every day | ORAL | Status: DC
  Administered 2015-04-19 – 2015-04-30 (×12): 10 mg via ORAL
  Filled 2015-04-19 (×12): qty 1

## 2015-04-19 MED ORDER — QUETIAPINE FUMARATE 200 MG PO TABS
200.0000 mg | ORAL_TABLET | Freq: Every day | ORAL | Status: DC
  Administered 2015-04-19 – 2015-04-22 (×4): 200 mg via ORAL
  Filled 2015-04-19 (×4): qty 1

## 2015-04-19 MED ORDER — QUETIAPINE FUMARATE 25 MG PO TABS
25.0000 mg | ORAL_TABLET | Freq: Two times a day (BID) | ORAL | Status: DC
Start: 2015-04-19 — End: 2015-04-23
  Administered 2015-04-19 – 2015-04-22 (×8): 25 mg via ORAL
  Filled 2015-04-19 (×9): qty 1

## 2015-04-19 NOTE — Progress Notes (Signed)
Recreation Therapy Notes  Date: 05.27.16 Time: 3:00 pm Location: Craft Room  Group Topic: Communication, Problem solving, Teamwork  Goal Area(s) Addresses:  Patient will work in teams towards shared goal. Patient will verbalize skills needed to make activity successful. Patient will verbalize benefit of using skills identify to reach post d/c goals.  Behavioral Response: Attentive, Interactive  Intervention: Landing Pad  Activity: Patients were given 12 straws and approximately 2.5 feet of tape and instructed to build a lading pad to catch a golf ball from 4 feet.   Education: LRT educated patient on communication, problem solving, and teamwork and why these skills are important.   Education Outcome: Acknowledges education/In group clarification offered  Clinical Observations/Feedback: Patient worked with peers to build a landing pad. Patient contributed to group discussion by stating what skills he used in group, why communication, problem solving, and teamwork are important, and how he will use communication, teamwork, and problem solving effectively when he d/c. Patient would get off track and LRT had to bring discussion back to the topic.   Jacquelynn CreeGreene,Miyoshi Ligas M, LRT/CTRS 04/19/2015 4:27 PM
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
## 2015-04-19 NOTE — [facility] Group Notes (Signed)
[facility] Note  04/19/2015 2:51 PM  Participation Quality:  Intrusive, Monopolizing and Redirectable  Affect:  Labile  Cognitive:  Alert and Disorganized  Insight:  Lacking  Engagement in Group:  Monopolizing and Off Topic  Modes of Intervention:  Limit-setting, Socialization and Support  Summary of Progress/Problems: Patient attends group but is off topic and not able to relate to experiences of other group members. Patient at times is distracting but redirectable.   [name], Sultan Pargas T 04/19/2015, 2:51 PM
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
## 2015-04-19 NOTE — [facility] Group Notes (Signed)
[facility] Group Notes:  (Nursing/MHT/Case Management/Adjunct)  Date:  04/19/2015  Time:  12:09 AM  Type of Therapy:  Group Therapy  Participation Level:  Active  Participation Quality:  Appropriate and Attentive  Affect:  Appropriate  Cognitive:  Alert and Appropriate  Insight:  Appropriate  Engagement in Group:  Engaged  Modes of Intervention:  Discussion  Summary of Progress/Problems:  Anjelo Pullman Joy Tityana Pagan 04/19/2015, 12:09 AM

## 2015-04-19 NOTE — [facility] Group Notes (Signed)
[facility] LCSW Group Therapy  04/19/2015 3:17 PM  Type of Therapy:  Group Therapy  Participation Level:  Active  Participation Quality:  Appropriate and Attentive  Affect:  Labile  Cognitive:  Disorganized  Insight:  Lacking  Engagement in Therapy:  Distracting and Engaged  Modes of Intervention:  Limit-setting, Socialization and Support  Summary of Progress/Problems: Patient attended group and participated but was off topic and struggled relating to group discussion.  [name], Kyle [name] 04/19/2015, 3:17 PM

## 2015-04-19 NOTE — Plan of Care (Signed)
Problem: [facility] Participation in Recreation Therapeutic Interventions Goal: STG-Other Recreation Therapy Goal (Specify) STG: Stress Management - Within 7 treatment sessions, patient will demonstrate at least one stress management technique in each of 2 treatment sessions to increase stress management skills.  Outcome: Progressing Treatment Session 1; Completed 0 out of 2: At approximately 2:35 pm, LRT met with patient in hallway. LRT educated and provided patient with handouts on stress management techniques. Patient verbalized understanding. LRT encouraged patient to read over and practice the stress management techniques.  Leonette Monarch, LRT/CTRS 05.27.16 2:42 pm
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
## 2015-04-19 NOTE — [facility] Group Notes (Signed)
[facility] Group Notes:  (Nursing/MHT/Case Management/Adjunct)  Date:  04/19/2015  Time:  12:35 PM  Type of Therapy:  Psychoeducational Skills  Participation Level:  Active  Participation Quality:  Appropriate  Affect:  Appropriate  Cognitive:  Appropriate  Insight:  Appropriate  Engagement in Group:  Engaged  Modes of Intervention:  Activity  Summary of Progress/Problems:  Kyle ChafeJonathan Mark Izzah [name] 04/19/2015, 12:35 PM

## 2015-04-20 NOTE — Progress Notes (Signed)
Nursing care taken over at 0015 patient resting in bed at that time with eyes closed , no issues to report on shift thus far.  

## 2015-04-20 NOTE — Progress Notes (Signed)
Patient visible within the community. He is hypomanic, hyper verbal, pressured. Flirtatious with male staff and patients.Fair response to redirection. He is attending groups and cooperative with medication. He continues to assert that his ex wife is the cause of all his "problem" and was unable to understand some of the symptoms of his illness.
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
## 2015-04-21 NOTE — Progress Notes (Signed)
Patient rates his depression a "10"; denies any SI and contracts for safety; denies feeling anxious or having any hallucinations; has been calm and cooperative throughout the shift; continue to monitor.

## 2015-04-21 NOTE — [facility] Counselor (Signed)
Adult Comprehensive Assessment  Patient ID: Kyle JacquetMarvin D Sirico, male   DOB: [birth year], 69 y.o.   MRN: [id number]  Information Source: Information source: Patient  Current Stressors:  Family Relationships: had recent fight with Ex wife  Living/Environment/Situation:  Living Arrangements: Alone Living conditions (as described by patient or guardian): Pt reports he has 2 houses and 2 trailers. "No issue there" How long has patient lived in current situation?: Years What is atmosphere in current home: Comfortable  Family History:  Does patient have children?: Yes How many children?: 3 How is patient's relationship with their children?: He remains in touch with 2 children the third he has conflict  Childhood History:  By whom was/is the patient raised?: Both parents Additional childhood history information: [place] father completed suicide and his sister died in a car wreck Description of patient's relationship with caregiver when they were a child: good Patient's description of current relationship with people who raised him/her: there deceased now Does patient have siblings?: Yes Number of Siblings: 4 Description of patient's current relationship with siblings: 1 sister dead the other alive and 2 brothers Did patient suffer any verbal/emotional/physical/sexual abuse as a child?: No Did patient suffer from severe childhood neglect?: No Has patient ever been sexually abused/assaulted/raped as an adolescent or adult?: No Was the patient ever a victim of a crime or a disaster?: No Witnessed domestic violence?: No  Education:  Highest grade of school patient has completed: GED Currently a student?: No Learning disability?: No  Employment/Work Situation:   Employment situation: On disability Why is patient on disability: SSDI unknown He states he has enough money to get by but also gets a check How long has patient been on disability: unknown Patient's job has been  impacted by current illness: No What is the longest time patient has a held a job?: na Where was the patient employed at that time?: na Has patient ever been in the [employer] and Companymilitary?: No Has patient ever served in Buyer, retailcombat?: No  Financial Resources:   Financial resources: [employer]icare, Actoreceives SSDI Does patient have a Lawyerrepresentative payee or guardian?: No  Alcohol/Substance Abuse:   What has been your use of drugs/alcohol within the last 12 months?: no If attempted suicide, did drugs/alcohol play a role in this?: No Alcohol/Substance Abuse Treatment Hx: Denies past history Has alcohol/substance abuse ever caused legal problems?: No  Social Support System:   Conservation officer, natureatient's Community Support System: Fair Museum/gallery exhibitions officerDescribe Community Support System: he stays in touch with his daughters and has alot of friends Type of faith/religion: Ephriam KnucklesChristian How does patient's faith help to cope with current illness?: You betcha !! God loves and forgives you  Leisure/Recreation:   Leisure and Hobbies: walking in the [place],animals, I love children and God  Strengths/Needs:   What things does the patient do well?: very caring, no issues supporting or socializing In what areas does patient struggle / problems for patient: He is too friendly and made some inappropriate comments to staff ( women)  Discharge Plan:   Does patient have access to transportation?: Yes Will patient be returning to same living situation after discharge?: Yes Currently receiving community mental health services: Yes (From Whom) (he states he [place]- he hasnt seem him in 18 months though, this will need to be checked) If no, would patient like referral for services when discharged?: Yes (What county?) [place]) Does patient have financial barriers related to discharge medications?: No  Summary/Recommendations:   Summary and  Recommendations (to be completed by the evaluator): This patient is 69 year old white  male with a diagnosis of bipolar-mania and prefers to be called Kyle [name]. He recalls the story of his father taking his life after his sister was killed in a car wreck. Patient remains a little manic and disorganised and at times will go on about GOD. Patient is unable to state who his psychiatrist is but gave me a name Akintayo in [place]  ( this will need to be checked) He reports he has several homes and can live in any one of them he chooses and he is wealthy. While in hospital patient is agreeable to take his medication and attend groups.  Maycel Riffe M. 04/21/2015

## 2015-04-21 NOTE — [facility] Group Notes (Signed)
[facility] LCSW Group Therapy  04/21/2015 1:13 PM  Type of Therapy:  Group Therapy  Participation Level:  Active  Participation Quality:  Redirectable  Affect:  Appropriate  Cognitive:  Confused  Insight:  Developing/Improving  Engagement in Therapy:  Developing/Improving  Modes of Intervention:  Discussion, Education, Exploration and Support  Summary of Progress/Problems: Todays group focus was discussing community resources and ways to remain well in the community vs hospitalization. All patients were handed a Pharmacist, hospitalresource list for [facility] from the [employer]. Each group member was encouraged to discuss resources they access and the benefits. After pts were encouraged to support their peers. Patient remains a little confused and was a bit off topic, when re-asked the question the pt was able to follow and respond approprietly. Johnella MoloneyBandi, Mustapha Colson M 04/21/2015, 1:13 PM

## 2015-04-21 NOTE — [facility] Group Notes (Signed)
[facility] Group Notes:  (Nursing/MHT/Case Management/Adjunct)  Date:  04/20/2015  Time:  2020  Type of Therapy:  Group Therapy  Participation Level:  Did Not Attend  Participation Quality:  Did not attend  Affect:  Did not attend  Cognitive:  Did not attend  Insight:  None  Engagement in Group:  None  Modes of Intervention:  Did not attend  Summary of Progress/Problems:  Eivin Mascio L Ayiana Winslett 04/21/2015, 4:35 AM

## 2015-04-21 NOTE — Tx Team (Signed)
Interdisciplinary Treatment Plan Update (Adult)  Date:  04/21/2015 Time Reviewed:  9:31 AM  Progress in Treatment: Attending groups: Yes. Participating in groups:  Yes. Taking medication as prescribed:  Yes. Tolerating medication:  Yes. Family/Significant othe contact made:  No, will contact:  Patient does not wish family to be contacted Patient understands diagnosis:  Yes. Discussing patient identified problems/goals with staff:  Yes. Medical problems stabilized or resolved:  Yes. Denies suicidal/homicidal ideation: Yes. Issues/concerns per patient self-inventory:  No. Other:  New problem(s) identified: No, Describe:     Discharge Plan or Barriers: Needs to be attached lives in [place]  Reason for Continuation of Hospitalization: Mania  Comments:  Estimated length of stay:5-7 days  New goal(s): To get well, be happy and go home  Review of initial/current patient goals per problem list:   Refer to plan of care  Attendees: Patient:  Kyle JacquetMarvin D Beer  May 26,2016  Family:     Physician:  Dr Ardyth HarpsHernandez  May 26,2016  Nursing:      Case Manager:     Counselor:     Other:  Arrie Senatelaudine Kerri Kovacik LCSW May 26,2016  Other:  Beryl MeagerJason Ingle [facility] May 26,2016  Other:  Princella IonElizabeth Greene LRT May 26,2016  Other:    Other:    Other:    Other:    Other:    Other:    Other:   5/29/20169:31 AM   Scribe for Treatment Team:   Cheron SchaumannBandi, Michall Noffke M, 04/21/2015, 9:31 AM
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
## 2015-04-21 NOTE — Progress Notes (Signed)
D) Patient pleasant and cooperative upon my assessment. Patient completed Self Inventory Assessment and rates depression as  7/10, patient rates hopeless feelings as 1/10.  Patient denies SI/HI, denies A/V hallucinations.  Patient's affect is flat and mood is sad.  Reports that his appetite is good.   A) Patient offered support and encouragement, patient encouraged to discuss feelings/concerns with staff. Patient verbalized understanding. Patient monitored Q15 minutes for safety. Patient met with MD  to discuss today's goals and plan of care.  R) Patient visible in milieu, attending groups and interacting with peers in dayroom.  Patient appropriate with staff and peers.   Patient taking medications as ordered. Will continue to monitor.

## 2015-04-21 NOTE — Plan of Care (Signed)
Problem: Alteration in mood Goal: LTG-Patient's behavior demonstrates decreased manic symptoms (Patient's behavior demonstrates decreased manic symptoms to the point the patient is safe to return home and continue treatment in an outpatient setting)  Outcome: Progressing Patient has been calm and cooperative; no manic episodes observed or reported.
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
## 2015-04-22 LAB — BASIC METABOLIC PANEL
Anion gap: 9 (ref 5–15)
BUN: 10 mg/dL (ref 6–20)
CHLORIDE: 102 mmol/L (ref 101–111)
CO2: 28 mmol/L (ref 22–32)
Calcium: 9.2 mg/dL (ref 8.9–10.3)
Creatinine, Ser: 0.64 mg/dL (ref 0.61–1.24)
GFR calc Af Amer: >60 mL/min (ref >60)
GFR calc non Af Amer: >60 mL/min (ref >60)
GLUCOSE: 101 mg/dL — AB (ref 65–99)
Potassium: 3.9 mmol/L (ref 3.5–5.1)
Sodium: 139 mmol/L (ref 135–145)

## 2015-04-22 LAB — LITHIUM LEVEL: Lithium Lvl: 0.34 mmol/L — ABNORMAL LOW (ref 0.60–1.20)

## 2015-04-22 NOTE — Progress Notes (Signed)
D) Patient pleasant and cooperative upon my assessment. Patient completed Self Inventory Assessment and rates depression as 2 /10, patient rates hopeless feelings as 3 /10.  Patient denies SI/HI, denies A/V hallucinations.  Patient's affect is flat and mood is sad.  Reports that his appetite is good.   A) Patient offered support and encouragement, patient encouraged to discuss feelings/concerns with staff. Patient verbalized understanding. Patient monitored Q15 minutes for safety. Patient met with MD  to discuss today's goals and plan of care.  R) Patient visible in milieu, Patient has been very intrusive with peers and staff. Patient came to the nursing station numerous times asking for someone to shave his peer.  We reminded patient to focus on his treatment groups and allow other patients to speak for themselves.  Patient appropriate with staff and peers.   Patient taking medications as ordered. Will continue to monitor.    

## 2015-04-22 NOTE — Progress Notes (Signed)
Recreation Therapy Notes  Date: 05.30.16 Time: 3:05 pm Location: Craft Room  Group Topic: Self-expression   Goal Area(s) Addresses:  Patient will draw a bottle. Patient will write at least one emotion they are experiencing.  Behavioral Response: Attentive, Interactive, Disruptive/Inappropriate  Intervention: Bottled Up  Activity: Patients were instructed to draw a bottled of how they feel and write emotions they are feeling inside the bottle.   Education:LRT educated patients on different forms of self-expression.   Education Outcome: Acknowledges education/In group clarification offered  Clinical Observations/Feedback: Patient completed activity. Patient contributed to group discussion by stating he felt better getting his emotions out on paper. Patient was having side conversations with peers. LRT redirected patient and patient complied.  Jacquelynn CreeGreene,Madge Therrien M, LRT/CTRS 04/22/2015 4:21 PM

## 2015-04-22 NOTE — [facility] Group Notes (Signed)
[facility] Group Notes:  (Nursing/MHT/Case Management/Adjunct)  Date:  04/22/2015  Time:  1:37 PM  Type of Therapy:  Psychoeducational Skills  Participation Level:  Active  Participation Quality:  Appropriate and Redirectable  Affect:  Appropriate  Cognitive:  Appropriate  Insight:  Appropriate  Engagement in Group:  Engaged  Modes of Intervention:  Discussion and Education  Summary of Progress/Problems:  Kyle SmokeCara Travis [name] 04/22/2015, 1:37 PM
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
## 2015-04-22 NOTE — Progress Notes (Signed)
Pt is awake and active in the milieu this evening. Pt mood is labile and his affect is anxious. Pt is somewhat disruptive in the dayroom, becoming agitate without good reason. Writer redirected pt to the medication room for HS administration and encouraged pt to be more polite to his peers. Pt is receptive to staff input, and went to bed shortly afterwards.

## 2015-04-23 MED ORDER — HALOPERIDOL 0.5 MG PO TABS
1.0000 mg | ORAL_TABLET | Freq: Three times a day (TID) | ORAL | Status: DC
  Administered 2015-04-23 (×2): 1 mg via ORAL
  Filled 2015-04-23 (×2): qty 2

## 2015-04-23 MED ORDER — QUETIAPINE FUMARATE 100 MG PO TABS
100.0000 mg | ORAL_TABLET | Freq: Two times a day (BID) | ORAL | Status: DC
  Administered 2015-04-23 (×2): 100 mg via ORAL
  Filled 2015-04-23 (×2): qty 1

## 2015-04-23 MED ORDER — LITHIUM CARBONATE 300 MG PO CAPS
300.0000 mg | ORAL_CAPSULE | Freq: Every morning | ORAL | Status: DC
  Administered 2015-04-23: 300 mg via ORAL
  Filled 2015-04-23: qty 1

## 2015-04-23 MED ORDER — QUETIAPINE FUMARATE 200 MG PO TABS: 300.0000 mg | ORAL_TABLET | Freq: Every day | ORAL | Status: DC

## 2015-04-23 MED ORDER — LITHIUM CARBONATE ER 300 MG PO TBCR
300.0000 mg | EXTENDED_RELEASE_TABLET | Freq: Every day | ORAL | Status: DC
Start: 2015-04-24 — End: 2015-04-27
  Administered 2015-04-24 – 2015-04-26 (×3): 300 mg via ORAL
  Filled 2015-04-23 (×3): qty 1

## 2015-04-23 MED ORDER — QUETIAPINE FUMARATE 200 MG PO TABS
200.0000 mg | ORAL_TABLET | Freq: Every day | ORAL | Status: DC
  Administered 2015-04-23 – 2015-04-24 (×2): 200 mg via ORAL
  Filled 2015-04-23 (×2): qty 1

## 2015-04-23 NOTE — Plan of Care (Signed)
Problem: Ineffective individual coping Goal: STG: Patient will participate in after care plan Outcome: Progressing Pt participating on treatment plan.

## 2015-04-23 NOTE — Progress Notes (Signed)
Patient has been loud and argumentative today, insisting that he is going on and that someone was in parking lot to pick him up. Thoughts disorganized. He denies SI/HI/AVH. Continue to monitor.
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
## 2015-04-23 NOTE — Progress Notes (Signed)
Pt is awake and active in the milieu this PM. Pt mood is euthymic and his affect is labile. Pt continues to be intrusive and needs frequent redirection. Pt speech is tangential and pressured and content is delusional, although he is ultimately cooperative with staff and taking medications as prescribed.

## 2015-04-23 NOTE — [facility] Group Notes (Signed)
[facility] LCSW Group Therapy  04/23/2015 4:00 PM  Type of Therapy:  Group Therapy  Participation Level:  Active  Participation Quality:  Intrusive and Monopolizing  Affect:  Labile  Cognitive:  Disorganized  Insight:  Lacking  Engagement in Therapy:  Distracting  Modes of Intervention:  Confrontation, Education, Socialization and Support  Summary of Progress/Problems: Patient attended group and was pleasant but off topic in the beginning of group. Patient began giving advice to other group members and offended another group members and was not redirectable and asked to leave group. Several other group members left due to being tired of patient's behavior in group over the past weekend.   [name], Shavonte Zhao T 04/23/2015, 4:00 PM

## 2015-04-23 NOTE — Progress Notes (Signed)
Alert and oriented x 2-3 with periods of confusion to place and situation, reoriented as needed, pt's affect is flat, mood is tense and irritable, thoughts are disorganized, not interacting appropriately with peers, not attending group but compliant with medication. Denies SI/HI, 15 minutes checks maintained, will continue to monitor.

## 2015-04-23 NOTE — [facility] Group Notes (Signed)
[facility] Group Notes:  (Nursing/MHT/Case Management/Adjunct)  Date:  04/23/2015  Time:  1:52 PM  Type of Therapy:  Group Therapy  Participation Level:  Active  Participation Quality:  Intrusive  Affect:  Appropriate  Cognitive:  Appropriate  Insight:  Appropriate  Engagement in Group:  Supportive  Modes of Intervention:  Discussion  Summary of Progress/Problems:  Kyle [name] 04/23/2015, 1:52 PM

## 2015-04-23 NOTE — Progress Notes (Signed)
Recreation Therapy Notes  Date: 05.31.16 Time: 3:00 pm Location: Craft Room  Group Topic: Goal Setting  Goal Area(s) Addresses:  Patient will be able to identify one supportive statement per peer in group. Patient will verbalize benefit of setting goals. Patient will verbalize benefit of supporting peers. Patient will identify benefit of feeling supported.  Behavioral Response: Attentive, Interactive, Disruptive/Inappropriate  Intervention: Step By Step  Activity: Patients were given a worksheet of a foot and instructed to list their goal inside the foot. Patients passed their worksheets around and peers wrote encouraging advice on the outside of the foot.  Education: LRT educated patients on goal setting and why it is important.  Education Outcome: Acknowledges education/In group clarification offered  Clinical Observations/Feedback: Patient wrote goal on h worksheet and encouraging words on peer's papers. LRT had to redirect patient multiple times for calling patients "mama" and "son". Patient complied. Patient contributed to group discussion.  Jacquelynn CreeGreene,Brolin Dambrosia M, LRT/CTRS 04/23/2015 4:08 PM

## 2015-04-23 NOTE — Plan of Care (Signed)
Problem: Ineffective individual coping Goal: STG: Patient will remain free from self harm Outcome: Progressing Patient has labile mood but no evidence of self harm. On q 15 minute checks.

## 2015-04-23 NOTE — Plan of Care (Signed)
Problem: Alteration in mood Goal: STG-Patient does not harm self or others Outcome: Progressing Patient denies SI/HI, towards peers or staff.

## 2015-04-24 MED ORDER — AMANTADINE HCL 100 MG PO CAPS
100.0000 mg | ORAL_CAPSULE | Freq: Two times a day (BID) | ORAL | Status: DC
  Administered 2015-04-24 – 2015-04-30 (×13): 100 mg via ORAL
  Filled 2015-04-24 (×13): qty 1

## 2015-04-24 MED ORDER — HALOPERIDOL 2 MG PO TABS
2.0000 mg | ORAL_TABLET | Freq: Three times a day (TID) | ORAL | Status: DC
  Administered 2015-04-24 – 2015-04-29 (×15): 2 mg via ORAL
  Filled 2015-04-24 (×16): qty 1

## 2015-04-24 NOTE — Progress Notes (Signed)
Recreation Therapy Notes  Date: 06.01.16 Time: 3:00 pm Location: Craft Room  Group Topic: Self-esteem  Goal Area(s) Addresses:  Patient will write at least one positive trait about self. Patient will list one benefit of having a good self-esteem.  Behavioral Response: Attentive, Interactive, Disruptive/Inappropriate  Intervention: I Am  Activity: Patients were given a worksheet with the letter I on it and instructed to list as many positive trait about themselves inside the letter.  Education: LRT educated patient on ways they can increase their self-esteem.  Education Outcome: Acknowledges education/In group clarification offered  Clinical Observations/Feedback: Patient completed activity by filling the letter up with positive traits. LRT had to redirect patient multiple times for making comments about LRT's age and asking if she was the "daughter of a kangaroo". Patient complied.  Jacquelynn CreeGreene,Seraphim Affinito M, LRT/CTRS 04/24/2015 4:28 PM
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
## 2015-04-24 NOTE — Progress Notes (Signed)
Alert and oriented 2-3, patient was noted agitated, bullying other patients on the unit and making sexually inappropriate statement towards male peers, redirected and told patient that was inappropriate behavior on the unit, 15 minutes checks maintained will continue to monitor.

## 2015-04-24 NOTE — [facility] Group Notes (Signed)
[facility] Group Notes:  (Nursing/MHT/Case Management/Adjunct)  Date:  04/24/2015  Time:  11:32 PM  Type of Therapy:  Group Therapy  Participation Level:  Active  Participation Quality:  Appropriate  Affect:  Appropriate  Cognitive:  Appropriate  Insight:  Appropriate  Engagement in Group:  Engaged  Modes of Intervention:  N/A  Summary of Progress/Problems:  Kyle [name] 04/24/2015, 11:32 PM

## 2015-04-24 NOTE — Progress Notes (Signed)
Patient has not been as talkative today and thoughts seem clearer, though they are still disorganized. He has his room arranged in a bizarre fashion with toiletries stacked in various places. Denies SI/HI/AVH. No acute distress.

## 2015-04-24 NOTE — Plan of Care (Signed)
Problem: Ineffective individual coping Goal: STG: Patient will remain free from self harm Outcome: Progressing Patient denies SI/HI. 15 minutes checks maintained.

## 2015-04-25 MED ORDER — ZOLPIDEM TARTRATE 5 MG PO TABS
5.0000 mg | ORAL_TABLET | Freq: Every day | ORAL | Status: DC
  Administered 2015-04-25 – 2015-04-26 (×2): 5 mg via ORAL
  Filled 2015-04-25 (×2): qty 1

## 2015-04-25 NOTE — Progress Notes (Signed)
Alert and oriented  X 4, thoughts are organized, speech is logical and coherent, interacting appropriately with peers and staff, compliant with medication, 15 minutes check maintained will continue to closely monitor.

## 2015-04-25 NOTE — Plan of Care (Signed)
Problem: Ineffective individual coping Goal: STG-Increase in ability to manage activities of daily living Outcome: Progressing Patient has been showering and washing his clothes.

## 2015-04-25 NOTE — Plan of Care (Signed)
Problem: Alteration in mood Goal: LTG-Patient's behavior demonstrates decreased manic symptoms (Patient's behavior demonstrates decreased manic symptoms to the point the patient is safe to return home and continue treatment in an outpatient setting)  Outcome: Progressing Patient's thought are fairly organized no confusion noted,

## 2015-04-25 NOTE — Progress Notes (Signed)
Initial Nutrition Assessment  DOCUMENTATION CODES:     INTERVENTION:   (Meals and Snacks: Cater to patient preferences)  NUTRITION DIAGNOSIS:   (No nutrition concerns at this time)   GOAL:  Patient will meet greater than or equal to 90% of their needs  MONITOR:   (Energy Intake, Anthropometrics)  REASON FOR ASSESSMENT:   (RD Screen- LOS)    ASSESSMENT:  Reason For Admission: Bipolar PMHx:  Past Medical History  Diagnosis Date  . 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 Bipolar 1 disorder     Typical Fluid/ Food Intake: 85-100% of meals recorded per I/O Meal/ Snack Patterns: Unable to assess Supplements: None  Labs:  Electrolyte and Renal Profile:  Recent Labs Lab 04/22/15 0635  BUN 10  CREATININE 0.64  NA 139  K 3.9   Meds: reviewed  Physical Findings: n/a Weight Changes: No significant weight changes noted  Height:  Ht Readings from Last 1 Encounters:  04/18/15 5\' 9"  (1.753 m)    Weight:  Wt Readings from Last 1 Encounters:  04/18/15 210 lb (95.255 kg)    Ideal Body Weight:     Wt Readings from Last 10 Encounters:  04/18/15 210 lb (95.255 kg)  12/24/12 200 lb (90.719 kg)    BMI:  Body mass index is 31 kg/(m^2).  Skin:  Reviewed, no issues  Diet Order:  Diet 2 gram sodium Room service appropriate?: No; Fluid consistency:: Thin  EDUCATION NEEDS:  No education needs identified at this time   Intake/Output Summary (Last 24 hours) at 04/25/15 1500 Last data filed at 04/25/15 0900  Gross per 24 hour  Intake   1320 ml  Output      0 ml  Net   1320 ml    Last BM:  unknown  Joeseph Amorracey L. Gaines, RDN Pager: [phone] Office: [phone]  LOW Care Level
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
## 2015-04-25 NOTE — Progress Notes (Signed)
Recreation Therapy Notes  Date: 06.02.16 Time: 3:30 pm Location: Craft Room  Group Topic: Leisure Education/Coping Skills  Goal Area(s) Addresses: Patient will identify things they are grateful for. Patient will identify why it is important to be grateful.   Behavioral Response: Attentive  Intervention: Grateful Wheel  Activity: Patients were given a "I am Grateful For" worksheet and instructed to list 2-3 things they were grateful for under each category.  Education: LRT educated patients on why it is important to be grateful.   Education Outcome: Acknowledges education/In group clarification offered   Clinical Observations/Feedback: Patient completed worksheet. Patient did not contribute to group discussion.  Arun Herrod M, LRT/CTRS 04/25/2015 4:32 PM 

## 2015-04-25 NOTE — Progress Notes (Signed)
Patient continues to be irritable at times and is saying that he wants "the gray-haired doctor." He complains about the medication making him feel "drunk and needs much encouragement to take his meds. Today he has stayed in his room much of the day. He denies SI/HI/AVH. Continue to monitor.
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
## 2015-04-25 NOTE — [facility] Group Notes (Signed)
[facility] Group Notes:  (Nursing/MHT/Case Management/Adjunct)  Date:  04/25/2015  Time:  2:19 PM  Type of Therapy:  Group Therapy  Participation Level:  None  Participation Quality:  n/a  Affect:  n/a  Cognitive:  n/a  Insight:  None  Engagement in Group:  n/a  Modes of Intervention:  n/a  Summary of Progress/Problems:  Kyle [name] M [name] 04/25/2015, 2:19 PM

## 2015-04-26 MED ORDER — TRAMADOL HCL 50 MG PO TABS
50.0000 mg | ORAL_TABLET | Freq: Two times a day (BID) | ORAL | Status: DC | PRN
  Administered 2015-04-30: 50 mg via ORAL
  Filled 2015-04-26: qty 1

## 2015-04-26 MED ORDER — LIDOCAINE 5 % EX PTCH
1.0000 | MEDICATED_PATCH | CUTANEOUS | Status: DC
  Administered 2015-04-26 – 2015-04-30 (×5): 1 via TRANSDERMAL
  Filled 2015-04-26 (×5): qty 1

## 2015-04-26 NOTE — [facility] Group Notes (Signed)
[facility] Group Notes:  (Nursing/MHT/Case Management/Adjunct)  Date:  04/26/2015  Time:  1:35 AM  Type of Therapy:  Group Therapy  Participation Level:  Active  Participation Quality:  Appropriate  Affect:  Appropriate  Cognitive:  Appropriate  Insight:  Appropriate  Engagement in Group:  Engaged  Modes of Intervention:  n/a  Summary of Progress/Problems:  Kyle [name] 04/26/2015, 1:35 AM

## 2015-04-26 NOTE — [facility] Group Notes (Signed)
[facility]  04/26/2015 11:36 AM  Participation Quality:  Appropriate and Attentive  Affect:  Appropriate  Cognitive:  Alert and Appropriate  Insight:  Improving  Engagement in Group:  Improving  Modes of Intervention:  Socialization and Support  Summary of Progress/Problems: Patient attended group and participated appropriately which is a tremendous improvement as patient has been so intrusive he was not allowed to go to group but managed to stay throughout the entire session as a model group member. Patient reports his SMART goal is to "get out, make changes, try something different, and maybe move somewhere".   Beryl Meagerngle, Armon Orvis T 04/26/2015, 11:36 AM

## 2015-04-26 NOTE — Progress Notes (Signed)
D:  Per pt self inventory pt reports sleeping fair, appetite good, energy level normal, ability to pay attention good, rates depression at a 2 out of 10, hopelessness at a 1 out of 10, anxiety at a 1 out of 10, denies SI/HI/AVH, silly, bright during interaction.    A:  Emotional support provided, Encouraged pt to continue with treatment plan and attend all group activities, q15 min checks maintained for safety.  R:  Pt is going to groups, inappropriate with staff at times--flirtatious, pleasant with staff and other patients on the unit.
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
## 2015-04-26 NOTE — Progress Notes (Signed)
Recreation Therapy Notes  Date: 06.03.16 Time: 3:00 pm Location: Craft Room  Group Topic: Self-expression/Coping skills  Goal Area(s) Addresses:  Patient will effectively use art as a means of self-expression. Patient will recognize positive benefit of self-expression. Patient will be able to identify one emotion experienced during group session. Patient will identify use of art as a coping skill.  Behavioral Response: Attentive, Interactive  Intervention: Two Faces of Me  Activity: Patients were given a blank face worksheet and instructed to draw a line down the middle. On one side of the face, patients were instructed to draw or write how they felt when they were admitted. On the other side of the face, patient were instructed to draw or write how they want to feel when they are d/c.  Education: LRT educated patients on how art is an important coping skill.   Education Outcome: Acknowledges education/In group clarification offered  Clinical Observations/Feedback: Patient completed activity. Patient contributed to group discussion by stating what emotions he experienced while in group.  Jacquelynn CreeGreene,Gavriela Cashin M, LRT/CTRS 04/26/2015 4:26 PM

## 2015-04-26 NOTE — Plan of Care (Signed)
Problem: Alteration in mood Goal: LTG-Patient reports reduction in suicidal thoughts (Patient reports reduction in suicidal thoughts and is able to verbalize a safety plan for whenever patient is feeling suicidal)  Outcome: Progressing Reports no SI  Problem: Ineffective individual coping Goal: LTG: Patient will report a decrease in negative feelings Outcome: Progressing Reports feeling happy, watching the basketball game and looking forward to discharge.  Goal: STG: Patient will remain free from self harm Outcome: Progressing No self-harm

## 2015-04-27 ENCOUNTER — Encounter: Payer: Self-pay | Admitting: Psychiatry

## 2015-04-27 LAB — BASIC METABOLIC PANEL
Anion gap: 11 (ref 5–15)
BUN: 10 mg/dL (ref 6–20)
CO2: 25 mmol/L (ref 22–32)
Calcium: 9.8 mg/dL (ref 8.9–10.3)
Chloride: 103 mmol/L (ref 101–111)
Creatinine, Ser: 0.66 mg/dL (ref 0.61–1.24)
GFR calc Af Amer: >60 mL/min (ref >60)
GFR calc non Af Amer: >60 mL/min (ref >60)
Glucose, Bld: 114 mg/dL — ABNORMAL HIGH (ref 65–99)
Potassium: 4.4 mmol/L (ref 3.5–5.1)
Sodium: 139 mmol/L (ref 135–145)

## 2015-04-27 LAB — LITHIUM LEVEL: Lithium Lvl: 2.35 mmol/L (ref 0.60–1.20)

## 2015-04-27 MED ORDER — ZOLPIDEM TARTRATE 5 MG PO TABS: 10.0000 mg | ORAL_TABLET | Freq: Every day | ORAL | Status: DC

## 2015-04-27 MED ORDER — ZOLPIDEM TARTRATE 5 MG PO TABS
5.0000 mg | ORAL_TABLET | Freq: Every day | ORAL | Status: DC
  Administered 2015-04-27 – 2015-04-29 (×3): 5 mg via ORAL
  Filled 2015-04-27 (×3): qty 1

## 2015-04-27 NOTE — Progress Notes (Signed)
[facility] about Critical Lithium level of 2.35.

## 2015-04-27 NOTE — Progress Notes (Signed)
D) Patient pleasant and cooperative upon my assessment. Patient completed Self Inventory Assessment and rates her anxiety 2/10 and depression 2/10. Patient denies SI/HI, denies A/V hallucinations. Patient's affect is flat and mood is depressed. Reports that his appetite is good.   A) Patient offered support and encouragement, patient encouraged to discuss feelings/concerns with staff. Patient verbalized understanding. Patient monitored Q15 minutes for safety. Patient met with MD to discuss today's goals and plan of care.  R) Patient visible in milieu, Patient continues to be intrusive and demanding at times, however, he is redirectable. Patient appropriate with staff and peers. Patient taking medications as ordered. Will continue to monitor.
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
## 2015-04-27 NOTE — [facility] Group Notes (Signed)
[facility] Group Notes:  (Nursing/MHT/Case Management/Adjunct)  Date:  04/27/2015  Time:  10:45 AM  Type of Therapy:  Community Meeting   Participation Level:  Active  Participation Quality:  Appropriate  Affect:  Appropriate  Cognitive:  Appropriate  Insight:  Good  Engagement in Group:  Engaged  Modes of Intervention:  Discussion  Summary of Progress/Problems:  Kyle [name] Kyle [name] 04/27/2015, 10:45 AM

## 2015-04-27 NOTE — Plan of Care (Signed)
Problem: Alteration in mood Goal: LTG-Patient reports reduction in suicidal thoughts (Patient reports reduction in suicidal thoughts and is able to verbalize a safety plan for whenever patient is feeling suicidal)  Outcome: Progressing Reports no SI.  Problem: Ineffective individual coping Goal: LTG: Patient will report a decrease in negative feelings Outcome: Progressing Reports feeling better, decrease negative thoughts.  Goal: STG: Patient will remain free from self harm Outcome: Progressing No self harm.

## 2015-04-28 DIAGNOSIS — T56891A Toxic effect of other metals, accidental (unintentional), initial encounter: Secondary | ICD-10-CM

## 2015-04-28 LAB — LITHIUM LEVEL: Lithium Lvl: 0.2 mmol/L — ABNORMAL LOW (ref 0.60–1.20)

## 2015-04-28 MED ORDER — LITHIUM CARBONATE 300 MG PO CAPS
600.0000 mg | ORAL_CAPSULE | Freq: Every day | ORAL | Status: DC
  Administered 2015-04-28 – 2015-04-29 (×2): 600 mg via ORAL
  Filled 2015-04-28 (×2): qty 2

## 2015-04-28 NOTE — [facility] Group Notes (Signed)
[facility] LCSW Group Therapy  04/28/2015 3:37 PM  Type of Therapy:  Group Therapy  Participation Level:  Active  Participation Quality:  Attentive and Redirectable  Affect:  Labile  Cognitive:  Alert and Disorganized  Insight:  Lacking  Engagement in Therapy:  Distracting  Modes of Intervention:  Limit-setting, Socialization and Support  Summary of Progress/Problems: Patient attended group and attempted to participate but was off topic. Patient made inappropriate comments during group but was redirectable.   [name], Naviyah Schaffert T 04/28/2015, 3:37 PM

## 2015-04-28 NOTE — Progress Notes (Signed)
D) Patient pleasant and cooperative upon my assessment. Patient completed Self Inventory Assessment and rates her anxiety 2/10 and depression 2/10. Patient denies SI/HI, denies A/V hallucinations. Patient's affect is flat and mood is depressed. Reports that his appetite is good.   A) Patient offered support and encouragement, patient encouraged to discuss feelings/concerns with staff. Patient verbalized understanding. Patient monitored Q15 minutes for safety. Patient met with MD to discuss today's goals and plan of care.  R) Patient visible in milieu, Patient less intrusive this shift. Patient appropriate with staff and peers. Patient taking medications as ordered. Will continue to monitor.

## 2015-04-28 NOTE — Plan of Care (Signed)
Problem: Alteration in mood Goal: LTG-Patient reports reduction in suicidal thoughts (Patient reports reduction in suicidal thoughts and is able to verbalize a safety plan for whenever patient is feeling suicidal)  Outcome: Progressing Denies current SI.
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
## 2015-04-28 NOTE — Progress Notes (Signed)
Pt visible on the unit. In good spirits. States he feels much better and is ready to go home. Pt denies suicidal thoughts. Med compliant. Attended group.

## 2015-04-28 NOTE — Plan of Care (Signed)
Problem: Alteration in mood Goal: LTG-Patient reports reduction in suicidal thoughts (Patient reports reduction in suicidal thoughts and is able to verbalize a safety plan for whenever patient is feeling suicidal)  Outcome: Progressing Pt denies SI     

## 2015-04-29 MED ORDER — LITHIUM CARBONATE ER 300 MG PO TBCR: 600.0000 mg | EXTENDED_RELEASE_TABLET | Freq: Every day | ORAL | Status: DC

## 2015-04-29 MED ORDER — AMLODIPINE BESYLATE 10 MG PO TABS: 10.0000 mg | ORAL_TABLET | Freq: Every day | ORAL | Status: DC

## 2015-04-29 MED ORDER — ATENOLOL 25 MG PO TABS
50.0000 mg | ORAL_TABLET | Freq: Every day | ORAL | Status: DC
  Administered 2015-04-29: 50 mg via ORAL
  Filled 2015-04-29: qty 2

## 2015-04-29 MED ORDER — HALOPERIDOL 2 MG PO TABS: 2.0000 mg | ORAL_TABLET | Freq: Every day | ORAL | Status: DC

## 2015-04-29 MED ORDER — DIPHENHYDRAMINE HCL 25 MG PO CAPS: 25.0000 mg | ORAL_CAPSULE | Freq: Every day | ORAL | Status: DC

## 2015-04-29 MED ORDER — HALOPERIDOL 2 MG PO TABS
2.0000 mg | ORAL_TABLET | Freq: Two times a day (BID) | ORAL | Status: DC
  Administered 2015-04-29 – 2015-04-30 (×2): 2 mg via ORAL
  Filled 2015-04-29 (×2): qty 1

## 2015-04-29 NOTE — Progress Notes (Signed)
Recreation Therapy Notes  Date: 06.06.16 Time: 3:00 pm Location: Craft Room  Group Topic: Wellness  Goal Area(s) Addresses:  Patient will identify at least one item per dimension of health. Patient will examine areas they are deficient in.  Behavioral Response: Attentive, Interactive  Intervention: 6 Dimensions of Health  Activity: Patients were given a worksheet with the 6 dimensions of health on it. Patients were given a worksheet with the 6 dimensions of health on it and instructed to list at least 1 item under each category.   Education:LRT educated patients on each dimension of health and how they can increase each dimension.  Education Outcome: Acknowledges education/In group clarification offered  Clinical Observations/Feedback: Patient completed activity by listing at least one item in each category. Patient contributed to group discussion by stating which category he is giving enough attention to.  Jacquelynn CreeGreene,Jonell Brumbaugh M, LRT/CTRS 04/29/2015 4:23 PM

## 2015-04-29 NOTE — [facility] Group Notes (Signed)
[facility] Group Notes:  (Nursing/MHT/Case Management/Adjunct)  Date:  04/29/2015  Time:  4:02 AM  Type of Therapy:  Group Therapy  Participation Level:  Active  Participation Quality:  Appropriate  Affect:  Appropriate  Cognitive:  Appropriate  Insight:  Appropriate  Engagement in Group:  Engaged  Modes of Intervention:  n/a  Summary of Progress/Problems:  Kyle [name] 04/29/2015, 4:02 AM

## 2015-04-29 NOTE — [facility] Group Notes (Signed)
[facility] LCSW Group Therapy  04/29/2015 5:45 PM  Type of Therapy:  Group Therapy  Participation Level:  Active  Participation Quality:  Appropriate  Affect:  Appropriate  Cognitive:  Appropriate  Insight:  Developing/Improving  Engagement in Therapy:  Engaged  Modes of Intervention:  Discussion, Education, Exploration and Support  Summary of Progress/Problems:Patients group therapy topic was stress and anxiety. Pts were all encouraged to share a stressful and anxious moment and then as a collective group discuss ways to reduce stress and anxiety. This patient was kind and supportive to his peers.   Arrie SenateBandi, Aleiah Mohammed M 04/29/2015, 5:45 PM

## 2015-04-29 NOTE — [facility] Group Notes (Signed)
[facility]  04/29/2015 10:12 AM  Participation Quality:  Attentive  Affect:  Appropriate  Cognitive:  Appropriate  Insight:  Engaged  Engagement in Group:  Engaged  Modes of Intervention:  Discussion, Education and Support  Summary of Progress/Problems:patients goal is to meet with psychiatrist and to be discharged soon  BrooktrailsBandi, Kyle [name] 04/29/2015, 10:12 AM
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
## 2015-04-29 NOTE — Progress Notes (Signed)
Patient stated he slept fair last night . Stated his appetite and concentration is  good , energy level normal . Stated depression 0 hopelessness 0 and anxiety 0 ( low 0-10 high ). Attending unit programming . Thought process clearer. Seen by hospitalist today for blood  Pressure .  Elevated this afternoon at dinner 187/79. Patient aware of possibility of discharge tomorrow .

## 2015-04-29 NOTE — [facility] Group Notes (Signed)
[facility] Group Notes:  (Nursing/MHT/Case Management/Adjunct)  Date:  04/29/2015  Time:  12:18 PM  Type of Therapy:  Group Therapy  Participation Level:  Active  Participation Quality:  Appropriate and Attentive  Affect:  Appropriate  Cognitive:  Appropriate  Insight:  Appropriate  Engagement in Group:  Supportive  Modes of Intervention:  Support  Summary of Progress/Problems:  Kyle [name] 04/29/2015, 12:18 PM
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
## 2015-04-30 LAB — LITHIUM LEVEL: LITHIUM LVL: 0.32 mmol/L — AB (ref 0.60–1.20)

## 2015-04-30 MED ORDER — ATENOLOL 25 MG PO TABS
25.0000 mg | ORAL_TABLET | Freq: Every day | ORAL | Status: DC
  Administered 2015-04-30: 25 mg via ORAL
  Filled 2015-04-30: qty 1

## 2015-04-30 MED ORDER — ATENOLOL 25 MG PO TABS: 25.0000 mg | ORAL_TABLET | Freq: Every day | ORAL | Status: DC

## 2015-04-30 MED ORDER — ATENOLOL 50 MG PO TABS: 50.0000 mg | ORAL_TABLET | Freq: Every day | ORAL | Status: DC

## 2015-04-30 NOTE — Progress Notes (Signed)
  [facility] :  Will you be returning to the same living situation after discharge:  Yes,    At discharge, do you have transportation home?: No.Hospital will provide Cab to home as [facility] unable to transport. Do you have the ability to pay for your medications: Yes,     Release of information consent forms completed and in the chart;  Patient's signature needed at discharge.  Patient to Follow up at: Follow-up Information    Follow up with [facility]. Go in 1 week.   Why:  Hospital Follow up, Outpatient Medication Management, Therapy   Contact information:   [address] [place] [address] [phone]; fax-[phone]      Patient denies SI/HI: Yes,       Safety Planning and Suicide Prevention discussed: Yes,     Have you used any form of tobacco in the last 30 days? (Cigarettes, Smokeless Tobacco, Cigars, and/or Pipes): No  Has patient been referred to the Quitline?: N/A Pt not a smoker  Glennon MacLaws, Basel Defalco P, LCSW 04/30/2015, 2:38 PM
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
## 2015-04-30 NOTE — [facility] Suicide Risk Assessment (Signed)
[facility]   Demographic Factors:  Male, Age 69 or older, Divorced or widowed and Caucasian  Total Time spent with patient: 30 minutes  Musculoskeletal: Strength & Muscle Tone: within normal limits Gait & Station: normal Patient leans: N/A  Psychiatric Specialty Exam: Physical Exam  ROS                                                         Have you used any form of tobacco in the last 30 days? (Cigarettes, Smokeless Tobacco, Cigars, and/or Pipes): No  Has this patient used any form of tobacco in the last 30 days? (Cigarettes, Smokeless Tobacco, Cigars, and/or Pipes) Yes, A prescription for an FDA-approved tobacco cessation medication was offered at discharge and the patient refused  Mental Status Per Nursing Assessment::   On Admission:  NA  Current Mental Status by Physician: Patient denies suicidality, homicidality or having auditory or visual hallucinations. Patient is no longer manic. Mood is euthymic patient is calm and cooperative  Loss Factors: Loss of significant relationship  Historical Factors: Prior suicide attempts  Risk Reduction Factors:   Religious beliefs about death and Positive social support  Continued Clinical Symptoms:  Bipolar disorder most recent episode manic now resolved  Cognitive Features That Contribute To Risk:  None    Suicide Risk:  Minimal: No identifiable suicidal ideation.  Patients presenting with no risk factors but with morbid ruminations; may be classified as minimal risk based on the severity of the depressive symptoms  Principal Problem: Bipolar 1 disorder, manic, moderate Discharge Diagnoses:  Patient Active Problem List   Diagnosis Date Noted  . Bipolar 1 disorder, manic, moderate [F31.12] 04/17/2015  . Hypertension [I10] 04/17/2015      Plan Of Care/Follow-up recommendations:  Other:  Follow-up with outpatient psychiatry and primary care provider for  hypertension  Is patient on multiple antipsychotic therapies at discharge:  No   Has Patient had three or more failed trials of antipsychotic monotherapy by history:  No  Recommended Plan for Multiple Antipsychotic Therapies: NA    Jimmy FootmanHernandez-Gonzalez,  Tyneka Scafidi 04/30/2015, 9:09 AM

## 2015-04-30 NOTE — Progress Notes (Signed)
Recreation Therapy Notes  INPATIENT RECREATION TR PLAN  Patient Details Name: Kyle [name] MRN: [id number] DOB: [birth year] Today's Date: 04/30/2015  Rec Therapy Plan Is patient appropriate for Therapeutic Recreation?: Yes Treatment times per week: At least 3 times a week TR Treatment/Interventions: 1:1 session, Group participation (Comment) (Appropriate participation in daily recreation therapy tx)  Discharge Criteria Pt will be discharged from therapy if:: Discharged Treatment plan/goals/alternatives discussed and agreed upon by:: Patient/family  Discharge Summary Short term goals set: See Care Plan Short term goals met: Adequate for discharge Progress toward goals comments: One-to-one attended Which groups?: Self-esteem, Wellness, Social skills, Communication, Coping skills, Goal setting, Leisure education, Other (Comment) (Self-expression) One-to-one attended: Stress Management Reason goals not met: Patient planning to d/c before goal could be met Therapeutic equipment acquired: None Reason patient discharged from therapy: Discharge from hospital Pt/family agrees with progress & goals achieved: Yes Date patient discharged from therapy: 04/30/15   Leonette Monarch, LRT/CTRS 04/30/2015, 12:23 PM
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
## 2015-04-30 NOTE — Plan of Care (Signed)
Problem: Alteration in mood Goal: LTG-Patient reports reduction in suicidal thoughts (Patient reports reduction in suicidal thoughts and is able to verbalize a safety plan for whenever patient is feeling suicidal)  Outcome: Progressing Denies SI.States ready to be discharged.

## 2015-04-30 NOTE — Progress Notes (Signed)
D:Patient aware of discharge this am shift. Patient returning home . Patient denies suicidal and homicidal ideations . A: Patient instructed on follow up with [facility]. Writer review discharge instruction, verbalize  Understanding . Patient received  All belongings. R: Taxie called for transport to his home in [place] . Patient  Voice no concerns  Escorted to taxi

## 2015-04-30 NOTE — [facility] Suicide Risk Assessment (Signed)
[facility] INPATIENT:  Family/Significant Other Suicide Prevention Education  Suicide Prevention Education:  Patient Refusal for Family/Significant Other Suicide Prevention Education: The patient Kyle [name] has refused to provide written consent for family/significant other to be provided Family/Significant Other Suicide Prevention Education during admission and/or prior to discharge.  Physician notified.  Glennon MacLaws, Katharina Jehle P LCSW 04/30/2015, 2:41 PM

## 2015-04-30 NOTE — Progress Notes (Signed)
[facility] [place] [facility]   PATIENT NAME: Kyle LennoxMarvin [name]    MR#:  [id number]  DATE OF BIRTH:  08/27/1946  SUBJECTIVE:  CHIEF COMPLAINT:  No chief complaint on file. -patient admitted for bipolar disorder. Medical consult requested for hypertension. Blood pressure very well controlled today after adding atenolol. -Possible discharge today.  REVIEW OF SYSTEMS:  Review of Systems  Constitutional: Negative for fever and chills.  Respiratory: Negative for cough, shortness of breath and wheezing.   Cardiovascular: Negative for chest pain and palpitations.  Gastrointestinal: Negative for nausea, vomiting, abdominal pain, diarrhea and constipation.  Genitourinary: Negative for dysuria.  Neurological: Negative for dizziness, seizures and headaches.    DRUG ALLERGIES:  No Known Allergies  VITALS:  Blood pressure 131/75, pulse 64, temperature 98.2 F (36.8 C), temperature source Oral, resp. rate 20, height 5\' 9"  (1.753 m), weight 95.255 kg (210 lb).  PHYSICAL EXAMINATION:  Physical Exam  GENERAL:  69 y.o.-year-old patient lying in the bed with no acute distress.  EYES: Pupils equal, round, reactive to light and accommodation. No scleral icterus. Extraocular muscles intact.  HEENT: Head atraumatic, normocephalic. Oropharynx and nasopharynx clear.  NECK:  Supple, no jugular venous distention. No thyroid enlargement, no tenderness.  LUNGS: Normal breath sounds bilaterally, no wheezing, rales,rhonchi or crepitation. No use of accessory muscles of respiration.  CARDIOVASCULAR: S1, S2 normal. No murmurs, rubs, or gallops.  ABDOMEN: Soft, nontender, nondistended. Bowel sounds present. No organomegaly or mass.  EXTREMITIES: No pedal edema, cyanosis, or clubbing.  NEUROLOGIC: Cranial nerves II through XII are intact. Muscle strength 5/5 in all extremities. Sensation intact. Gait not checked.  PSYCHIATRIC: The patient is alert and oriented x 3. Slow to  respond SKIN: No obvious rash, lesion, or ulcer.    LABORATORY PANEL:   CBC No results for input(s): WBC, HGB, HCT, PLT in the last 168 hours. ------------------------------------------------------------------------------------------------------------------  Chemistries   Recent Labs Lab 04/27/15 0647  NA 139  K 4.4  CL 103  CO2 25  GLUCOSE 114*  BUN 10  CREATININE 0.66  CALCIUM 9.8   ------------------------------------------------------------------------------------------------------------------  Cardiac Enzymes No results for input(s): TROPONINI in the last 168 hours. ------------------------------------------------------------------------------------------------------------------  RADIOLOGY:  No results found.  EKG:   Orders placed or performed during the hospital encounter of 04/16/15  . ED EKG  . ED EKG    ASSESSMENT AND PLAN:   69 year old male with past medical history significant for bipolar disorder and tremor admitted for symptoms of bipolar disorder.  #1 hypertension-currently on Norvasc, atenolol was added too. Blood pressure very well controlled now. Continue both as outpatient.  #2 tremors-on atenolol  #3 bipolar disorder management per psychiatry. On  lithium  #4 back pain-on Lidoderm patch.  Possible discharge today. Blood pressure is stable. We'll sign off. Please call if any questions or concerns.  All the records are reviewed and case discussed with Care Management/Social Workerr. Management plans discussed with the patient, family and they are in agreement.  CODE STATUS: Full code  TOTAL TIME TAKING CARE OF THIS PATIENT: 35 minutes.    Enid BaasKALISETTI,Kishia Shackett M.D on 04/30/2015 at 10:42 AM  Between 7am to 6pm - Pager - [phone]  After 6pm go to [email or web address] - password [email or web address] [facility]  [facility]  Office  [phone]  CC: Primary care physician; No primary care provider on file.

## 2015-04-30 NOTE — [facility] Group Notes (Signed)
[facility] Group Notes:  (Nursing/MHT/Case Management/Adjunct)  Date:  04/30/2015  Time:  2:26 PM  Type of Therapy:  Group Therapy  Participation Level:  Did Not Attend   Marquette Oldmanda Lea Campbell 04/30/2015, 2:26 PM

## 2015-04-30 NOTE — Plan of Care (Signed)
Problem: [facility] Participation in Recreation Therapeutic Interventions Goal: STG-Other Recreation Therapy Goal (Specify) STG: Stress Management - Within 7 treatment sessions, patient will demonstrate at least one stress management technique in each of 2 treatment sessions to increase stress management skills.  Outcome: Adequate for Discharge Treatment Session 2; Completed 1 out of 2: At approximately 9:10 am, LRT met with patient in patient room. Patient reported he practiced some of the stress management techniques. Patient demonstrated one stress management technique. LRT encouraged patient to continue practicing the stress management techniques.  Leonette Monarch, LRT/CTRS 06.07.16 12:21 pm

## 2015-04-30 NOTE — Progress Notes (Signed)
AVS H&P Discharge Summary faxed to [facility] for hospital follow-up °

## 2015-05-04 DIAGNOSIS — F312 Bipolar disorder, current episode manic severe with psychotic features: Secondary | ICD-10-CM
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
## 2015-05-04 NOTE — Behavioral Health Treatment Team (Signed)
Dr. Rachael Darby wants the patient to be on B.H protocol without antipsychotics. He wants the CP-K to be repeated in the am.

## 2015-05-04 NOTE — Behavioral Health Treatment Team (Signed)
Late entry: Patient's skin assessment completed by RN Wyatt Haste and revealed no open wounds. Patient does have a superficial abrasion on forearm bilaterally and a bruise on right arm.

## 2015-05-05 ENCOUNTER — Inpatient Hospital Stay
Admit: 2015-05-05 | Discharge: 2015-05-31 | Disposition: A | Discharge status: Home / Self Care | Payer: MEDICARE | Source: Other Acute Inpatient Hospital | Attending: Psychiatry | Admitting: Psychiatry

## 2015-05-05 LAB — METABOLIC PANEL, COMPREHENSIVE
A-G Ratio: 0.9 — ABNORMAL LOW (ref 1.1–2.2)
ALT (SGPT): 44 U/L (ref 12–78)
AST (SGOT): 32 U/L (ref 15–37)
Albumin: 3.2 g/dL — ABNORMAL LOW (ref 3.5–5.0)
Alk. phosphatase: 78 U/L (ref 45–117)
Anion gap: 8 mmol/L (ref 5–15)
BUN/Creatinine ratio: 11 — ABNORMAL LOW (ref 12–20)
BUN: 8 MG/DL (ref 6–20)
Bilirubin, total: 0.5 MG/DL (ref 0.2–1.0)
CO2: 27 mmol/L (ref 21–32)
Calcium: 8.3 MG/DL — ABNORMAL LOW (ref 8.5–10.1)
Chloride: 106 mmol/L (ref 97–108)
Creatinine: 0.7 MG/DL (ref 0.70–1.30)
GFR est AA: >60 mL/min/{1.73_m2} (ref >60)
GFR est non-AA: >60 mL/min/{1.73_m2} (ref >60)
Globulin: 3.4 g/dL (ref 2.0–4.0)
Glucose: 130 mg/dL — ABNORMAL HIGH (ref 65–100)
Potassium: 3.5 mmol/L (ref 3.5–5.1)
Protein, total: 6.6 g/dL (ref 6.4–8.2)
Sodium: 141 mmol/L (ref 136–145)

## 2015-05-05 LAB — TROPONIN I: Troponin-I, Qt.: <0.04 ng/mL (ref <0.05)

## 2015-05-05 LAB — LIPID PANEL
CHOL/HDL Ratio: 3.8 (ref 0–5.0)
Cholesterol, total: 157 MG/DL (ref <200)
HDL Cholesterol: 41 MG/DL
LDL, calculated: 102.8 MG/DL — ABNORMAL HIGH (ref 0–100)
Triglyceride: 66 MG/DL (ref <150)
VLDL, calculated: 13.2 MG/DL

## 2015-05-05 LAB — CK W/ CKMB & INDEX
CK - MB: 9.9 NG/ML — ABNORMAL HIGH (ref 0.5–3.6)
CK-MB Index: 1.9 (ref 0–2.5)
CK: 528 U/L — ABNORMAL HIGH (ref 39–308)

## 2015-05-05 LAB — TSH 3RD GENERATION: TSH: 0.48 u[IU]/mL (ref 0.36–3.74)

## 2015-05-05 LAB — CK: CK: 535 U/L — ABNORMAL HIGH (ref 39–308)

## 2015-05-05 MED ORDER — ACETAMINOPHEN 325 MG TABLET
325 mg | ORAL | Status: DC | PRN
Start: 2015-05-05 — End: 2015-05-31
  Administered 2015-05-07 – 2015-05-28 (×12): via ORAL

## 2015-05-05 MED ORDER — QUETIAPINE 200 MG TAB
200 mg | Freq: Every evening | ORAL | Status: DC
Start: 2015-05-05 — End: 2015-05-11
  Administered 2015-05-06 – 2015-05-11 (×6): via ORAL

## 2015-05-05 MED ORDER — ZOLPIDEM 5 MG TAB
5 mg | Freq: Every evening | ORAL | Status: DC | PRN
Start: 2015-05-05 — End: 2015-05-31
  Administered 2015-05-25 – 2015-05-28 (×3): via ORAL

## 2015-05-05 MED ORDER — LORAZEPAM 2 MG/ML IJ SOLN
2 mg/mL | INTRAMUSCULAR | Status: DC | PRN
Start: 2015-05-05 — End: 2015-05-31

## 2015-05-05 MED ORDER — MAGNESIUM HYDROXIDE 400 MG/5 ML ORAL SUSP
400 mg/5 mL | Freq: Every day | ORAL | Status: DC | PRN
Start: 2015-05-05 — End: 2015-05-31

## 2015-05-05 MED ORDER — LORAZEPAM 0.5 MG TAB
0.5 mg | ORAL | Status: DC | PRN
Start: 2015-05-05 — End: 2015-05-31

## 2015-05-05 MED ORDER — BENZTROPINE 1 MG/ML IJ SOLN
1 mg/mL | Freq: Two times a day (BID) | INTRAMUSCULAR | Status: DC | PRN
Start: 2015-05-05 — End: 2015-05-31

## 2015-05-05 MED ORDER — NICOTINE 21 MG/24 HR DAILY PATCH
21 mg/24 hr | Freq: Every day | TRANSDERMAL | Status: DC | PRN
Start: 2015-05-05 — End: 2015-05-31

## 2015-05-05 MED ORDER — LITHIUM CARBONATE 300 MG CAP
300 mg | Freq: Every evening | ORAL | Status: DC
Start: 2015-05-05 — End: 2015-05-07
  Administered 2015-05-06 – 2015-05-07 (×2): via ORAL

## 2015-05-05 MED ORDER — BENZTROPINE 1 MG TAB
1 mg | Freq: Two times a day (BID) | ORAL | Status: DC | PRN
Start: 2015-05-05 — End: 2015-05-31

## 2015-05-05 MED ORDER — IBUPROFEN 400 MG TAB
400 mg | Freq: Three times a day (TID) | ORAL | Status: DC | PRN
Start: 2015-05-05 — End: 2015-05-05
  Administered 2015-05-05: 14:00:00 via ORAL

## 2015-05-05 MED FILL — IBUPROFEN 400 MG TAB: 400 mg | ORAL | Qty: 1

## 2015-05-05 NOTE — Progress Notes (Signed)
Problem: Altered Thought Process (Adult/Pediatric)  Goal: *STG: Decreased delusional thinking  Outcome: Progressing Towards Goal  Pt remains manic and delusional. His thoughts are loose and scattered. Pt reported that staff thinks that he is 75 but he is actually 69 years old. He reports that he is doing that for insurance purposes. His insight and his judgement remains poor. Reinforce reality and set firm limits on inappropriate behavior. Pt was medicated with Ibuprofen 400mg  for back pain 7/10. Will continue to monitor closely.

## 2015-05-05 NOTE — Behavioral Health Treatment Team (Signed)
INITIAL PSYCHIATRIC EVALUATION         IDENTIFICATION:    Patient Name  Corey Crawford   Date of Birth [birth year]   CSN [id number]   Medical Record Number  [id number]      Age  69 y.o.   PCP Phys Other, MD   Admit date:  05/04/2015    Room Number  310/02  @ [facility]   Date of Service  05/05/2015            HISTORY         REASON FOR HOSPITALIZATION:  CC: Pt admitted under a temporary detention order (TDO) for severe  severe psychosis and mania proving to be/posing an imminent danger to self and others and an inability to care for self.  HISTORY OF PRESENT ILLNESS:    The patient, Corey Crawford, is a 69 y.o.  WHITE OR CAUCASIAN male with a past psychiatric history significant for chronic mental illness , who presents at this time for a severe exacerbation of the principle diagnosis of Bipolar disorder with psychotic features ([place]). Patient reports/evidences the following emotional symptoms:  agitation, mania and psychosis.  Additional symptomatology include agitation, anger outbursts, difficulty sleeping, increased irritability and problem with medication.  The above symptoms have been present for . These symptoms are of severe in  severity per patient's report. The symptoms are constant  fleeting in nature.  The patient's condition has been precipitated by and psychosocial stressors( multiple losses,death in family,diagnosed with colon cancer ).  Patient's condition made worse by treatment noncompliance. UDS negative, BAL=0.   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     ALLERGIES:  No Known Allergies   MEDICATIONS PRIOR TO ADMISSION:  No prescriptions prior to admission      PAST MEDICAL HISTORY:  No past medical history on file.No past surgical history on file.   SOCIAL HISTORY:    History     Social History   ??? Marital Status: LEGALLY SEPARATED     Spouse Name: N/A   ??? Number of Children: N/A   ??? Years of Education: N/A     Occupational History   ??? Not on file.     Social History Main Topics   ??? Smoking status: Not on file   ??? Smokeless tobacco: Not on file   ??? Alcohol Use: Not on file   ??? Drug Use: Not on file   ??? Sexual Activity: Not on file     Other Topics Concern   ??? Not on file     Social History Narrative   ??? No narrative on file      FAMILY HISTORY: History reviewed. No pertinent family history.   No family history on file.    REVIEW OF SYSTEMS:   Psychological ROS: positive for - irritability and mood swings  Respiratory ROS: no cough, shortness of breath, or wheezing  Cardiovascular ROS: no chest pain or dyspnea on exertion  Pertinent items are noted in the History of Present Illness. All other Systems reviewed  and are considered negative.           MENTAL STATUS EXAM & VITALS       MENTAL STATUS EXAM (MSE):    MSE FINDINGS ARE WITHIN NORMAL LIMITS (WNL) UNLESS OTHERWISE STATED BELOW.   Orientation oriented to time, place and person   Vital Signs (BP,Pulse, Temp) see below (reviewed 05/05/2015); vital signs are WNL if not listed below.   Gait and Station stable/steady, no ataxia   Abnormal Muscular Movements, Tone, and Behavior no EPS, no Tardive Dyskinesia, no abnormal muscular movements; wnl tone   Relations cooperative   General Appearance:  age appropriate, disheveled and overweight   Language no aphasia or dysarthria   Speech:  hyperverbal   Thought Processes illogical, wnl rate of thoughts, good abstract reasoning and computation   Thought Associations circumstantial, disorganized and tangential    Thought Content free of delusions   Suicidal Ideations no plan  and no intention   Homicidal Ideations no plan  and no intention   Mood:  irritable and labile   Affect:  anxious, euphoric, irritable and labile Memory recent  adequate   Memory remote:  adequate   Concentration/Attention:  adequate   Fund of Knowledge average   Insight:  limited   Reliability fair   Judgment:  limited            VITALS:     Patient Vitals for the past 24 hrs:   Temp Pulse Resp BP   05/05/15 0600 97 ??F (36.1 ??C) 65 18 162/66 mmHg   05/04/15 2145 98.8 ??F (37.1 ??C) 61 18 161/74 mmHg     Wt Readings from Last 3 Encounters:   No data found for Wt     Temp Readings from Last 3 Encounters:   05/05/15 97 ??F (36.1 ??C)      BP Readings from Last 3 Encounters:   05/05/15 162/66     Pulse Readings from Last 3 Encounters:   05/05/15 65            DATA       LABORATORY DATA:  Labs Reviewed   METABOLIC PANEL, COMPREHENSIVE - Abnormal; Notable for the following:     Glucose 130 (*)     BUN/Creatinine ratio 11 (*)     Calcium 8.3 (*)     Albumin 3.2 (*)     A-G Ratio 0.9 (*)     All other components within normal limits   LIPID PANEL - Abnormal; Notable for the following:     LDL, calculated 102.8 (*)     All other components within normal limits   CK - Abnormal; Notable for the following:     CK 535 (*)     All other components within normal limits   CK W/ CKMB & INDEX - Abnormal; Notable for the following:     CK 528 (*)     CK - MB 9.9 (*)     All other components within normal limits   TSH 3RD GENERATION     Admission on 05/04/2015   Component Date Value Ref Range Status   ??? Sodium 05/05/2015 141  136 - 145 mmol/L Final   ??? Potassium 05/05/2015 3.5  3.5 - 5.1 mmol/L Final   ??? Chloride 05/05/2015 106  97 - 108 mmol/L Final   ??? CO2 05/05/2015 27  21 - 32 mmol/L Final   ??? Anion gap 05/05/2015 8  5 - 15 mmol/L Final   ??? Glucose 05/05/2015  130* 65 - 100 mg/dL Final   ??? BUN 05/05/2015 8  6 - 20 MG/DL Final    ??? Creatinine 05/05/2015 0.70  0.70 - 1.30 MG/DL Final   ??? BUN/Creatinine ratio 05/05/2015 11* 12 - 20   Final   ??? GFR est AA 05/05/2015 >60  >60 ml/min/1.38m Final   ??? GFR est non-AA 05/05/2015 >60  >60 ml/min/1.724mFinal   ??? Calcium 05/05/2015 8.3* 8.5 - 10.1 MG/DL Final   ??? Bilirubin, total 05/05/2015 0.5  0.2 - 1.0 MG/DL Final   ??? ALT 05/05/2015 44  12 - 78 U/L Final   ??? AST 05/05/2015 32  15 - 37 U/L Final   ??? Alk. phosphatase 05/05/2015 78  45 - 117 U/L Final   ??? Protein, total 05/05/2015 6.6  6.4 - 8.2 g/dL Final   ??? Albumin 05/05/2015 3.2* 3.5 - 5.0 g/dL Final   ??? Globulin 05/05/2015 3.4  2.0 - 4.0 g/dL Final   ??? A-G Ratio 05/05/2015 0.9* 1.1 - 2.2   Final   ??? TSH 05/05/2015 0.48  0.36 - 3.74 uIU/mL Final   ??? LIPID PROFILE 05/05/2015       Final   ??? Cholesterol, total 05/05/2015 157  <200 MG/DL Final   ??? Triglyceride 05/05/2015 66  <150 MG/DL Final   ??? HDL Cholesterol 05/05/2015 41   Final   ??? LDL, calculated 05/05/2015 102.8* 0 - 100 MG/DL Final   ??? VLDL, calculated 05/05/2015 13.2   Final   ??? CHOL/HDL Ratio 05/05/2015 3.8  0 - 5.0   Final   ??? CK 05/05/2015 535* 39 - 308 U/L Final   ??? CK 05/05/2015 528* 39 - 308 U/L Final   ??? CK - MB 05/05/2015 9.9* 0.5 - 3.6 NG/ML Final   ??? CK-MB Index 05/05/2015 1.9  0 - 2.5   Final        RADIOLOGY REPORTS:  No results found for this or any previous visit.No results found.           MEDICATIONS       ALL MEDICATIONS  Current Facility-Administered Medications   Medication Dose Route Frequency   ??? lithium carbonate capsule 600 mg  600 mg Oral QHS   ??? QUEtiapine (SEROquel) tablet 200 mg  200 mg Oral QHS   ??? benztropine (COGENTIN) tablet 1 mg  1 mg Oral BID PRN   ??? benztropine (COGENTIN) injection 1 mg  1 mg IntraMUSCular Q12H PRN   ??? LORazepam (ATIVAN) injection 0.5 mg  0.5 mg IntraMUSCular Q4H PRN   ??? LORazepam (ATIVAN) tablet 0.5 mg  0.5 mg Oral Q4H PRN   ??? zolpidem (AMBIEN) tablet 5 mg  5 mg Oral QHS PRN   ??? acetaminophen (TYLENOL) tablet 650 mg  650 mg Oral Q4H PRN    ??? magnesium hydroxide (MILK OF MAGNESIA) oral suspension 30 mL  30 mL Oral DAILY PRN   ??? nicotine (NICODERM CQ) 21 mg/24 hr patch 1 Patch  1 Patch TransDERmal DAILY PRN      SCHEDULED MEDICATIONS  Current Facility-Administered Medications   Medication Dose Route Frequency   ??? lithium carbonate capsule 600 mg  600 mg Oral QHS   ??? QUEtiapine (SEROquel) tablet 200 mg  200 mg Oral QHS                ASSESSMENT & PLAN        The patient Corey Kowalskis a [age].o.  male who presents at this time for  treatment of the following diagnoses:  Patient Active Hospital Problem List:   Bipolar disorder with psychotic features ([place]) (05/05/2015)    Assessment:   Patient presents with manic symptoms , labile affect,hyperverbal,tangential,circumtantial,psychotic,easily distracted,limited insight,poor judgement.    Plan: Start lithium and Seroquel,Monitor labs and CPK    Coarse tremors (05/05/2015)    Assessment:Patient report history of coarse tremors for many years, on proparnolol    Plan: will refer to medical doctor to restart propranolol patient says he is on 80 mg of propranolol daily    Hypertension (05/05/2015)    Assessment: Continue monitor    Plan: refer to [place]          In summary, Corey Crawford presents with a severe exacerbation of the principal diagnosis, Bipolar disorder with psychotic features ([place]).  While on the unit [facility] will be provided with individual, milieu, occupational, group, and substance abuse therapies to address target symptoms as deemed appropriate for the individual patient.    I will continue to monitor blood levels (lithium,a drug with a narrow therapeutic index= NTI) and associated labs for drug therapy implemented that require intense monitoring for toxicity as deemed appropriate base on current medication side effects and pharmacodynamically determined drug 1/2 lives.       I agree with decision to admit patient. I have spoken to [facility]  psychiatric assessor/ED staff regarding the nature of patients's admission at this time.    A coordinated, multidisplinary treatment team round was conducted with the patient; that includes the nurse, unit pharmcist, Catering manager all present.     The following regarding medications was addressed during rounds with patient:   the risks and benefits of the proposed medication. The patient was given the opportunity to ask questions. Informed consent given to the use of the above medications.     I will continue to adjust psychiatric and non-psychiatric medications (see above "medication" section and orders section for details) as deemed appropriate & based upon diagnoses and response to treatment.     I have reviewed admission (and previous/old) labs and medical tests in the EHR and or transferring hospital documents. I will continue to order blood tests/labs and diagnostic tests as deemed appropriate and review results as they become available (see orders for details).    I have reviewed old psychiatric and medical records available in the EHR. I Will order additional psychiatric records from other institutions to further elucidate the nature of patient's psychopathology and review once available.    I will gather additional collateral information from friends, family and o/p treatment team to further elucidate the nature of patient's psychopathology and baselline level of psychiatric functioning.        ESTIMATED LENGTH OF STAY:       5-7  days       STRENGTHS:  Exercising self-direction/Resourceful, Access to housing/residential stability and Knowledge of medications                      SIGNED:    Francesca Jewett, MD  05/05/2015

## 2015-05-05 NOTE — Progress Notes (Signed)
Problem: Altered Thought Process (Adult/Pediatric)  Goal: *STG: Remains safe in hospital  The patient was observed lying in bed with eyes closed; the patient didn't appear to be in any distress. The patient had unlabored breathing. The patient slept for 5 hours and was safe during the night

## 2015-05-05 NOTE — Progress Notes (Signed)
Gave verbal report of CKP lab results to Dr. Basilia Jumbo.  Assessed pt and he advised that he his back and knee is in pain.  Will administer PRN pain medication per MAR.  Will reassess.

## 2015-05-05 NOTE — Progress Notes (Signed)
Problem: Altered Thought Process (Adult/Pediatric)  Goal: *STG: Complies with medication therapy  Outcome: Progressing Towards Goal  Pt has been visible on the unit.  Mood is manic.  Pt is preoccupied with thoughts of females.  He refers to male staff as wishing they were male and wanting his feet rubbed by a male staff.  His inappropriate comments are made in a non aggressive manner. Pt is easily redirectable. His thoughts remains loose in association.  Pt is meal/med compliant and pt did not receive any prn meds for thoughts or mood.  He denies SI/HI and denies hallucinations.  Staff will continue to reinforce reality and monitor pt's safety, mood, thoughts, and behavior.

## 2015-05-05 NOTE — Behavioral Health Treatment Team (Signed)
GROUP THERAPY PROGRESS NOTE    Steave Latronica is participating in nursing education group.     Group time: 30 minutes    Personal goal for participation: medication indication    Goal orientation: personal    Group therapy participation: minimal    Therapeutic interventions reviewed and discussed: yes    Impression of participation: needs follow up teaching

## 2015-05-05 NOTE — Behavioral Health Treatment Team (Signed)
Pt approached staff to let staff know that he had a vein that was visible in his right hand after drinking fluids.  Staff obtained blood from right hand x 1 attempt and sent the labs down for observation.

## 2015-05-05 NOTE — Behavioral Health Treatment Team (Signed)
Staff was unable to draw blood x 2 attempts.

## 2015-05-05 NOTE — Behavioral Health Treatment Team (Signed)
Unable to draw blood on patient. Will advise dayshift.

## 2015-05-06 MED FILL — LITHIUM CARBONATE 300 MG CAP: 300 mg | ORAL | Qty: 2

## 2015-05-06 MED FILL — SEROQUEL 200 MG TABLET: 200 mg | ORAL | Qty: 1

## 2015-05-06 NOTE — Behavioral Health Treatment Team (Cosign Needed)
GROUP THERAPY PROGRESS NOTE    The patient Corey Crawford a 69 y.o. male is participating in Creative Expression Group.     Group time: 1 hour    Personal goal for participation: To concentrate on selected task    Goal orientation: social    Group therapy participation: active    Therapeutic interventions reviewed and discussed: Crafts, games, music    Impression of participation: The patient was attentive.    BEVERLY S BAKER  05/06/2015  5:23 PM
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
## 2015-05-06 NOTE — Behavioral Health Treatment Team (Signed)
Patient did not attend process group held from 520 to 620 pm

## 2015-05-06 NOTE — Progress Notes (Signed)
Problem: Altered Thought Process (Adult/Pediatric)  Goal: *STG: Remains safe in hospital  Outcome: Progressing Towards Goal  Pt resting quietly in bed with eyes closed,  no sign of any distress at this time. Pt will continue to be monitored Q 15 minute for safety and needs. Pt slept about 7 hours.

## 2015-05-06 NOTE — Behavioral Health Treatment Team (Cosign Needed)
GROUP THERAPY PROGRESS NOTE    The patient Corey Crawford a 69 y.o. male is participating in Coping Skills Group.     Group time: 45 minutes    Personal goal for participation: To participate in relaxation activity    Goal orientation:  relaxation    Group therapy participation: active    Therapeutic interventions reviewed and discussed: favorite ways to relax    Impression of participation:  The patient was attentive.    BEVERLY S BAKER  05/06/2015  1:44 PM

## 2015-05-06 NOTE — Behavioral Health Treatment Team (Cosign Needed)
GROUP THERAPY PROGRESS NOTE    The patient Corey Crawford is participating in Community Group.    Group time: 30 minutes    Personal goal for participation: to orient the patient to the unit.    Goal orientation: successful adoption of unit rules    Group therapy participation: minimal    Therapeutic interventions reviewed and discussed: Yes    Impression of participation:minimal    [name]  05/06/2015 11:44 AM
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
## 2015-05-06 NOTE — Progress Notes (Signed)
Problem: Altered Thought Process (Adult/Pediatric)  Goal: *STG: Remains safe in hospital  Outcome: Progressing Towards Goal  Pt has been isolative to his room most of the time during the shift. Pt was food and med compliant, Pt was appropriate with staff and other pts in the milieu when he was in dayroom to watch basketball. Pt denied any S/I and H/I at this time, and continues to be on q 15 mins safety check. Pt was encouraged to attend all group activities and to discuss any issues or concerns with staff. Staff will also continue to monitor pt with Q -15 checks for safety.

## 2015-05-06 NOTE — Behavioral Health Treatment Team (Signed)
PSYCHIATRIC PROGRESS NOTE         Patient Name  Corey Crawford   Date of Birth 12-01-1945   CSN [id number]   Medical Record Number  [id number]      Age  69 y.o.   PCP Phys Other, MD   Admit date:  05/04/2015    Room Number  310/02  @ [facility]   Date of Service  05/06/2015          PSYCHOTHERAPY SESSION NOTE:  Length of psychotherapy session: 20 minutes    Main condition/diagnosis/issues treated during session today, 05/06/2015 : compliance/ psych medications    I employed  supportive psychotherapy in regards to various ongoing psychosocial stressors, including the following: pre-admission and current problems; housing issues; medical issues; and stress of hospitalization. Interpersonal relationship issues and psychodynamic conflicts explored.  Attempts made to alleviate maladaptive patterns. We, also, worked on issues of denial & effects of substance dependency/use     Overall, patient is not progressing    Treatment Plan Update (reviewed an updated 05/06/2015) :  I will modify psychotherapy tx plan by implementing more stress management strategies, building upon cognitive behavioral techniques, increasing coping skills, as well as shoring up psychological defenses).    An extended energy and skill set was needed to engage pt in psychotherapy due to some of the following: resistiveness, complexity, negativity, confrontational nature, hostile behaviors, and/or severe abnormalities in thought processes/psychosis resulting in the loss of expressive/receptive language communication skills.                            E & M PROGRESS NOTE:         HISTORY       CC:  "I am going to face the wall"  HISTORY OF PRESENT ILLNESS/INTERVAL HISTORY:  (reviewed/updated 05/06/2015).  In summary, Corey Crawford, is a 69 y.o.  male who presents with a severe exacerbation of the principal diagnosis, Bipolar disorder with psychotic features (HCC), which is improving and not stable. Patient requires  continued inpatient hospitalization for further stabilization, safety monitoring and medication management.  I will continue to coordinate the provision of individual, milieu, occupational, group, and substance abuse therapies to address target symptoms/diagnoses as deemed appropriate for the individual patient.  A coordinated, multidisplinary treatment team round was conducted with the patient (this team consists of the nurse, psychiatric unit pharmcist, Administrator).     per initial evaluation: The patient, Corey Crawford, is a 69 y.o.?? WHITE OR CAUCASIAN male with a past psychiatric history significant for chronic mental illness , who presents at this time for a severe exacerbation of the principle diagnosis of Bipolar disorder with psychotic features (HCC). Patient reports/evidences the following emotional symptoms:?? agitation, mania and psychosis.?? Additional symptomatology include agitation, anger outbursts, difficulty sleeping, increased irritability and problem with medication.?? The above symptoms have been present for . These symptoms are of severe in?? severity per patient's report. The symptoms are constant?? fleeting in nature.?? The patient's condition has been precipitated by and psychosocial stressors( multiple losses,death in family,diagnosed with colon cancer ).?? Patient's condition made worse by treatment noncompliance. UDS negative, BAL=0. ??  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     Corey Crawford presents/reports/evidences the following emotional symptoms today, 05/06/2015:  suicidal thoughts/threats, agitation, anxiety, hypomania and psychosis.  The above symptoms have been present for few weeks. These symptoms are of moderate severity. The symptoms are constant /intermittent in nature. Additional symptomatology include difficulty sleeping, increased irritability and poor concentration. Pt was seen with the team. He sat on the chair facing the wall. He was disorganized in his thinking with FOI and LOA. Continues to keep on coming back about his wife sleeping with somebody.       SIDE EFFECTS: (reviewed/updated 05/06/2015)  None reported or admitted to.  No noted toxicity with use of lithium   ALLERGIES:(reviewed/updated 05/06/2015)  No Known Allergies   MEDICATIONS PRIOR TO ADMISSION:(reviewed/updated 05/06/2015)  No prescriptions prior to admission      PAST MEDICAL HISTORY: Past medical history from the initial psychiatric evaluation has been reviewed (reviewed/updated 05/06/2015) with no additional updates (I asked patient and no additional past medical history provided). No past medical history on file.No past surgical history on file.   SOCIAL HISTORY: Social history from the initial psychiatric evaluation has been reviewed (reviewed/updated 05/06/2015) with no additional updates (I asked patient and no additional social history provided). History     Social History   ??? Marital Status: LEGALLY SEPARATED     Spouse Name: N/A   ??? Number of Children: N/A   ??? Years of Education: N/A     Occupational History   ??? Not on file.     Social History Main Topics   ??? Smoking status: Not on file   ??? Smokeless tobacco: Not on file   ??? Alcohol Use: Not on file   ??? Drug Use: Not on file   ??? Sexual Activity: Not on file     Other Topics Concern   ??? Not on file     Social History Narrative   ??? No narrative on file      FAMILY HISTORY: Family history from the initial psychiatric evaluation has  been reviewed (reviewed/updated 05/06/2015) with no additional updates (I asked patient and no additional family history provided). No family history on file.    REVIEW OF SYSTEMS: (reviewed/updated 05/06/2015)  Appetite:good   Sleep: good   All other Review of Systems:      General ROS: positive for  - fatigue and sleep disturbance  Psychological ROS: positive for - anxiety, irritability and sleep disturbances  negative for - suicidal ideation  Respiratory ROS: no cough, shortness of breath, or wheezing  Cardiovascular ROS: no chest pain or dyspnea on exertion         MENTAL STATUS EXAM & VITALS     MENTAL STATUS EXAM (MSE):    MSE FINDINGS ARE WITHIN NORMAL LIMITS (WNL) UNLESS OTHERWISE STATED BELOW. ( ALL OF THE BELOW CATEGORIES OF THE MSE HAVE BEEN REVIEWED (reviewed 05/06/2015) AND UPDATED AS DEEMED APPROPRIATE )  General Presentation age appropriate, casually dressed and older than stated age, unreliable and vague   Orientation oriented to time, place and person   Vital Signs  See below (reviewed 05/06/2015); Vital Signs (BP, Pulse, & Temp) are within normal limits if not listed below.   Gait and Station Stable/steady, no ataxia   Musculoskeletal System No extrapyramidal symptoms (EPS); no abnormal muscular movements or Tardive  Dyskinesia (TD); muscle strength and tone are within normal limits   Language No aphasia or dysarthria   Speech:  normal pitch and normal volume   Thought Processes illogical, wnl rate of thoughts, poor abstract reasoning and computation   Thought Associations goal directed and logical   Thought Content free of delusions and free of hallucinations   Suicidal Ideations none   Homicidal Ideations none   Mood:  euthymic   Affect:  full range   Memory recent  adequate   Memory remote:  adequate   Concentration/Attention:  impaired Fund of Knowledge Below average   Insight:  limited   Reliability poor   Judgment:  limited          VITALS:     Patient Vitals for the past 24 hrs:    Temp Pulse Resp BP   05/05/15 2222 98.2 ??F (36.8 ??C) 70 16 139/63 mmHg   05/05/15 1928 98.6 ??F (37 ??C) 60 18 162/60 mmHg     Wt Readings from Last 3 Encounters:   No data found for Wt     Temp Readings from Last 3 Encounters:   No data found for Temp     BP Readings from Last 3 Encounters:   05/05/15 139/63     Pulse Readings from Last 3 Encounters:   05/05/15 70            DATA     LABORATORY DATA:(reviewed/updated 05/06/2015)  No results found for this or any previous visit (from the past 24 hour(s)).  No results found for: VALF2, VALAC, VALP, VALPR, DS6, CRBAM, CRBAMP, CARB2, XCRBAM  No results found for: LI, LIH, LITHM   RADIOLOGY REPORTS:(reviewed/updated 05/06/2015)  No results found.       MEDICATIONS     ALL MEDICATIONS:   Current Facility-Administered Medications   Medication Dose Route Frequency   ??? lithium carbonate capsule 600 mg  600 mg Oral QHS   ??? QUEtiapine (SEROquel) tablet 200 mg  200 mg Oral QHS   ??? benztropine (COGENTIN) tablet 1 mg  1 mg Oral BID PRN   ??? benztropine (COGENTIN) injection 1 mg  1 mg IntraMUSCular Q12H PRN   ??? LORazepam (ATIVAN) injection 0.5 mg  0.5 mg IntraMUSCular Q4H PRN   ??? LORazepam (ATIVAN) tablet 0.5 mg  0.5 mg Oral Q4H PRN   ??? zolpidem (AMBIEN) tablet 5 mg  5 mg Oral QHS PRN   ??? acetaminophen (TYLENOL) tablet 650 mg  650 mg Oral Q4H PRN   ??? magnesium hydroxide (MILK OF MAGNESIA) oral suspension 30 mL  30 mL Oral DAILY PRN   ??? nicotine (NICODERM CQ) 21 mg/24 hr patch 1 Patch  1 Patch TransDERmal DAILY PRN      SCHEDULED MEDICATIONS:   Current Facility-Administered Medications   Medication Dose Route Frequency   ??? lithium carbonate capsule 600 mg  600 mg Oral QHS   ??? QUEtiapine (SEROquel) tablet 200 mg  200 mg Oral QHS          ASSESSMENT & PLAN     DIAGNOSES REQUIRING ACTIVE TREATMENT AND MONITORING: (reviewed/updated 05/06/2015)  Patient Active Hospital Problem List:   Bipolar disorder with psychotic features (HCC) (05/05/2015)     Assessment: hypomanic, loose with FOI. Poor insight    Plan: continue Li and Seroquel.    Coarse tremors (05/05/2015)    Assessment: stable    Plan: continue to mnoitor   Hypertension (05/05/2015)    Assessment: stable    Plan: monitor. Get  collatera            Complete current electronic health record for patient has been reviewed today including consultant notes, ancillary staff notes, nurses and psychiatric tech notes.    I will continue to monitor blood levels ( lithium---a drug with a narrow therapeutic index= NTI) and associated labs for drug therapy implemented that require intense monitoring for toxicity as deemed appropriate base on current medication side effects and pharmacodynamically determined drug 1/2 lives.    The following regarding medications was addressed during rounds with patient:   the risks and benefits of the proposed medication. The patient was given the opportunity to ask questions. Informed consent given to the use of the above medications. Will continue to adjust psychiatric and non-psychiatric medications (see above "medication" section and orders section for details) as deemed appropriate & based upon diagnoses and response to treatment.     I will continue to order blood tests/labs and diagnostic tests as deemed appropriate and review results as they become available (see orders for details and above listed lab/test results).    I will order psychiatric records from previous psych hospitals to further elucidate the nature of patient's psychopathology and review once available.    I will gather additional collateral information from friends, family and o/p treatment team to further elucidate the nature of patient's psychopathology and baselline level of psychiatric functioning.         I certify that this patient's inpatient psychiatric hospital services furnished since the previous certification were, and continue to be,  required for treatment that could reasonably be expected to improve the patient's condition, or for diagnostic study, and that the patient continues to need, on a daily basis, active treatment furnished directly by or requiring the supervision of inpatient psychiatric facility personnel. In addition, the hospital records show that services furnished were intensive treatment services, admission or related services, or equivalent services.    EXPECTED DISCHARGE DATE/DAY: TBD     DISPOSITION: Home       Signed By:   Adela Lank, MD  05/06/2015

## 2015-05-06 NOTE — Consults (Addendum)
Medical Consult for [facility] Patient    Consult H&P   dictated, see patient [phone]    Impression:    Corey Crawford a 69 y.o. male with past medical history of bipolar with psychosis presents with behavioral health problems of severe psychosis and mania admitted for further psychiatric evaluation and treatment.    Plan:   1. Psychiatry to manage mental health issues.  2. Medically stable at this time, will follow up as needed.  3. No VTE prophylaxis indicated or necessary at this time.     Thank you  Fay Records, MD  05/06/2015, 7:23 AM

## 2015-05-07 MED ORDER — ATENOLOL 50 MG TAB
50 mg | Freq: Every day | ORAL | Status: DC
Start: 2015-05-07 — End: 2015-05-31
  Administered 2015-05-07 – 2015-05-31 (×22): via ORAL

## 2015-05-07 MED ORDER — AMLODIPINE 5 MG TAB
5 mg | Freq: Every day | ORAL | Status: DC
Start: 2015-05-07 — End: 2015-05-31
  Administered 2015-05-07 – 2015-05-31 (×25): via ORAL

## 2015-05-07 MED ORDER — LITHIUM CARBONATE 300 MG CAP
300 mg | Freq: Every evening | ORAL | Status: DC
Start: 2015-05-07 — End: 2015-05-11
  Administered 2015-05-08 – 2015-05-11 (×4): via ORAL

## 2015-05-07 MED FILL — SEROQUEL 200 MG TABLET: 200 mg | ORAL | Qty: 1

## 2015-05-07 MED FILL — LITHIUM CARBONATE 300 MG CAP: 300 mg | ORAL | Qty: 2

## 2015-05-07 MED FILL — AMLODIPINE 5 MG TAB: 5 mg | ORAL | Qty: 2

## 2015-05-07 MED FILL — MAPAP (ACETAMINOPHEN) 325 MG TABLET: 325 mg | ORAL | Qty: 2

## 2015-05-07 MED FILL — ATENOLOL 50 MG TAB: 50 mg | ORAL | Qty: 1

## 2015-05-07 NOTE — Behavioral Health Treatment Team (Signed)
Pt refused MMSE.

## 2015-05-07 NOTE — Behavioral Health Treatment Team (Cosign Needed)
GROUP THERAPY PROGRESS NOTE    Corey Crawford is participating in [place].     Group time: 30 minutes    Personal goal for participation: reality orientation    Goal orientation: social    Group therapy participation: minimal    Therapeutic interventions reviewed and discussed: yes    Impression of participation: no problem

## 2015-05-07 NOTE — Behavioral Health Treatment Team (Cosign Needed)
GROUP THERAPY PROGRESS NOTE    The patient Corey Crawford a 69 y.o. male is participating in [employer].     Group time: 1 hour    Personal goal for participation: To concentrate on selected task    Goal orientation: social    Group therapy participation: active    Therapeutic interventions reviewed and discussed: Crafts, games, music    Impression of participation: The patient was attentive.    BEVERLY S BAKER  05/07/2015  5:22 PM

## 2015-05-07 NOTE — Behavioral Health Treatment Team (Cosign Needed)
This patient continues to  have no insight @ this documentation. Patient orientated X 2 name & place. Thought blockage is evidenced,with thought process being loose in Associations with topic.Pt contracted for safety.Patient appetite has remained adequate.Patient  personal hygiene fair.Patient isolative & preoccupied.   1:1 interaction to assess mood and thought process. Staff offering assistance as needed.   Pt denies S/H ideations.Pt denies hearing voices at this times .Pt attending  most groups.   Staff plan is to assess mood & thought process.Staff will monitor for changes. Q 15 minute checks continue.

## 2015-05-07 NOTE — Behavioral Health Treatment Team (Signed)
PSYCHIATRIC PROGRESS NOTE         Patient Name  Corey Crawford   Date of Birth 07/17/1946   CSN [id number]   Medical Record Number  [id number]      Age  69 y.o.   PCP Phys Other, MD   Admit date:  05/04/2015    Room Number  310/02  @ [facility]   Date of Service  05/07/2015          PSYCHOTHERAPY SESSION NOTE:  Length of psychotherapy session: 20 minutes    Main condition/diagnosis/issues treated during session today, 05/07/2015 : compliance/ psych medications    I employed  supportive psychotherapy in regards to various ongoing psychosocial stressors, including the following: pre-admission and current problems; housing issues; medical issues; and stress of hospitalization. Interpersonal relationship issues and psychodynamic conflicts explored.  Attempts made to alleviate maladaptive patterns. We, also, worked on issues of denial & effects of substance dependency/use     Overall, patient is not progressing    Treatment Plan Update (reviewed an updated 05/07/2015) :  I will modify psychotherapy tx plan by implementing more stress management strategies, building upon cognitive behavioral techniques, increasing coping skills, as well as shoring up psychological defenses).    An extended energy and skill set was needed to engage pt in psychotherapy due to some of the following: resistiveness, complexity, negativity, confrontational nature, hostile behaviors, and/or severe abnormalities in thought processes/psychosis resulting in the loss of expressive/receptive language communication skills.                            E & M PROGRESS NOTE:         HISTORY       CC:  "I am going to face the wall"  HISTORY OF PRESENT ILLNESS/INTERVAL HISTORY:  (reviewed/updated 05/07/2015).  In summary, [name], is a 69 y.o.  male who presents with a severe exacerbation of the principal diagnosis, Bipolar disorder with psychotic features (HCC), which is improving and not stable. Patient requires  continued inpatient hospitalization for further stabilization, safety monitoring and medication management.  I will continue to coordinate the provision of individual, milieu, occupational, group, and substance abuse therapies to address target symptoms/diagnoses as deemed appropriate for the individual patient.  A coordinated, multidisplinary treatment team round was conducted with the patient (this team consists of the nurse, psychiatric unit pharmcist, Administrator).     per initial evaluation: The patient, [name], is a 69 y.o.?? WHITE OR CAUCASIAN male with a past psychiatric history significant for chronic mental illness , who presents at this time for a severe exacerbation of the principle diagnosis of Bipolar disorder with psychotic features (HCC). Patient reports/evidences the following emotional symptoms:?? agitation, mania and psychosis.?? Additional symptomatology include agitation, anger outbursts, difficulty sleeping, increased irritability and problem with medication.?? The above symptoms have been present for . These symptoms are of severe in?? severity per patient's report. The symptoms are constant?? fleeting in nature.?? The patient's condition has been precipitated by and psychosocial stressors( multiple losses,death in family,diagnosed with colon cancer ).?? Patient's condition made worse by treatment noncompliance. UDS negative, BAL=0. ??  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     [name] presents/reports/evidences the following emotional symptoms today, 05/07/2015:  suicidal thoughts/threats, agitation, anxiety, hypomania and psychosis.  The above symptoms have been present for few weeks. These symptoms are of moderate severity. The symptoms are constant /intermittent in nature. Additional symptomatology include difficulty sleeping, increased irritability and poor concentration. Pt was seen with the team. He refused to sit on chair and had variety of physical complaints.  He was disorganized in his thinking with FOI and LOA.Sister told SW that he is separated from his wife and she is suffering from melanoma. He got angry when told that he is not leaving today.        SIDE EFFECTS: (reviewed/updated 05/07/2015)  None reported or admitted to.  No noted toxicity with use of lithium   ALLERGIES:(reviewed/updated 05/07/2015)  No Known Allergies   MEDICATIONS PRIOR TO ADMISSION:(reviewed/updated 05/07/2015)  Prescriptions prior to admission   Medication Sig   ??? lithium carbonate 600 mg capsule Take 600 mg by mouth nightly. Indications: BIPOLAR DISORDER   ??? atenolol (TENORMIN) 25 mg tablet Take 25 mg by mouth daily. Indications: HYPERTENSION   ??? amLODIPine (NORVASC) 10 mg tablet Take 10 mg by mouth daily. Indications: HYPERTENSION   ??? haloperidol (HALDOL) 2 mg tablet Take 2 mg by mouth nightly. Indications: BIIPOLAR DISORDER   ??? diphenhydrAMINE (BENADRYL) 25 mg capsule Take 25 mg by mouth nightly.      PAST MEDICAL HISTORY: Past medical history from the initial psychiatric evaluation has been reviewed (reviewed/updated 05/07/2015) with no additional updates (I asked patient and no additional past medical history provided). No past medical history on file.No past surgical history on file.   SOCIAL HISTORY: Social history from the initial psychiatric evaluation has been reviewed (reviewed/updated 05/07/2015) with no additional updates (I asked patient and no additional social history provided).    History     Social History   ??? Marital Status: LEGALLY SEPARATED     Spouse Name: N/A   ??? Number of Children: N/A   ??? Years of Education: N/A     Occupational History   ??? Not on file.     Social History Main Topics   ??? Smoking status: Not on file   ??? Smokeless tobacco: Not on file   ??? Alcohol Use: Not on file   ??? Drug Use: Not on file   ??? Sexual Activity: Not on file     Other Topics Concern   ??? Not on file     Social History Narrative   ??? No narrative on file      FAMILY HISTORY: Family history from the initial psychiatric evaluation has been reviewed (reviewed/updated 05/07/2015) with no additional updates (I asked patient and no additional family history provided). No family history on file.    REVIEW OF SYSTEMS: (reviewed/updated 05/07/2015)  Appetite:good   Sleep: good   All other Review of Systems:      General ROS: positive for  - fatigue and sleep disturbance  Psychological ROS: positive for - anxiety, irritability and sleep disturbances  negative for - suicidal ideation  Respiratory ROS: no cough, shortness of breath, or wheezing  Cardiovascular ROS: no chest pain or dyspnea on exertion         MENTAL STATUS EXAM & VITALS     MENTAL STATUS  EXAM (MSE):    MSE FINDINGS ARE WITHIN NORMAL LIMITS (WNL) UNLESS OTHERWISE STATED BELOW. ( ALL OF THE BELOW CATEGORIES OF THE MSE HAVE BEEN REVIEWED (reviewed 05/07/2015) AND UPDATED AS DEEMED APPROPRIATE )  General Presentation age appropriate, casually dressed and older than stated age, unreliable and vague   Orientation oriented to time, place and person   Vital Signs  See below (reviewed 05/07/2015); Vital Signs (BP, Pulse, & Temp) are within normal limits if not listed below.   Gait and Station Stable/steady, no ataxia   Musculoskeletal System No extrapyramidal symptoms (EPS); no abnormal muscular movements or Tardive Dyskinesia (TD); muscle strength and tone are within normal limits   Language No aphasia or dysarthria   Speech:  normal pitch and normal volume    Thought Processes illogical, wnl rate of thoughts, poor abstract reasoning and computation   Thought Associations goal directed and logical   Thought Content free of delusions and free of hallucinations   Suicidal Ideations none   Homicidal Ideations none   Mood:  euthymic   Affect:  full range   Memory recent  adequate   Memory remote:  adequate   Concentration/Attention:  impaired   Fund of Knowledge Below average   Insight:  limited   Reliability poor   Judgment:  limited          VITALS:     Patient Vitals for the past 24 hrs:   Temp Pulse Resp BP   05/07/15 1000 - 70 - 146/78 mmHg   05/07/15 0550 96.2 ??F (35.7 ??C) (!) 58 18 130/68 mmHg   05/06/15 1328 - 66 18 159/69 mmHg     Wt Readings from Last 3 Encounters:   No data found for Wt     Temp Readings from Last 3 Encounters:   05/07/15 96.2 ??F (35.7 ??C)      BP Readings from Last 3 Encounters:   05/07/15 146/78     Pulse Readings from Last 3 Encounters:   05/07/15 70            DATA     LABORATORY DATA:(reviewed/updated 05/07/2015)  No results found for this or any previous visit (from the past 24 hour(s)).  No results found for: VALF2, VALAC, VALP, VALPR, DS6, CRBAM, CRBAMP, CARB2, XCRBAM  No results found for: LI, LIH, LITHM   RADIOLOGY REPORTS:(reviewed/updated 05/07/2015)  No results found.       MEDICATIONS     ALL MEDICATIONS:   Current Facility-Administered Medications   Medication Dose Route Frequency   ??? atenolol (TENORMIN) tablet 25 mg  25 mg Oral DAILY   ??? amLODIPine (NORVASC) tablet 10 mg  10 mg Oral DAILY   ??? lithium carbonate capsule 900 mg  900 mg Oral QHS   ??? QUEtiapine (SEROquel) tablet 200 mg  200 mg Oral QHS   ??? benztropine (COGENTIN) tablet 1 mg  1 mg Oral BID PRN   ??? benztropine (COGENTIN) injection 1 mg  1 mg IntraMUSCular Q12H PRN   ??? LORazepam (ATIVAN) injection 0.5 mg  0.5 mg IntraMUSCular Q4H PRN   ??? LORazepam (ATIVAN) tablet 0.5 mg  0.5 mg Oral Q4H PRN   ??? zolpidem (AMBIEN) tablet 5 mg  5 mg Oral QHS PRN    ??? acetaminophen (TYLENOL) tablet 650 mg  650 mg Oral Q4H PRN   ??? magnesium hydroxide (MILK OF MAGNESIA) oral suspension 30 mL  30 mL Oral DAILY PRN   ??? nicotine (NICODERM CQ) 21 mg/24 hr patch 1  Patch  1 Patch TransDERmal DAILY PRN      SCHEDULED MEDICATIONS:   Current Facility-Administered Medications   Medication Dose Route Frequency   ??? atenolol (TENORMIN) tablet 25 mg  25 mg Oral DAILY   ??? amLODIPine (NORVASC) tablet 10 mg  10 mg Oral DAILY   ??? lithium carbonate capsule 900 mg  900 mg Oral QHS   ??? QUEtiapine (SEROquel) tablet 200 mg  200 mg Oral QHS          ASSESSMENT & PLAN     DIAGNOSES REQUIRING ACTIVE TREATMENT AND MONITORING: (reviewed/updated 05/07/2015)  Patient Active Hospital Problem List:   Bipolar disorder with psychotic features (HCC) (05/05/2015)    Assessment: hypomanic, loose with FOI. Poor insight    Plan: continue to titrate Li and Seroquel. Li level on Friday.    Coarse tremors (05/05/2015)    Assessment: stable    Plan: continue to mnoitor   Hypertension (05/05/2015)    Assessment: stable    Plan: monitor. Get collatera            Complete current electronic health record for patient has been reviewed today including consultant notes, ancillary staff notes, nurses and psychiatric tech notes.    I will continue to monitor blood levels ( lithium---a drug with a narrow therapeutic index= NTI) and associated labs for drug therapy implemented that require intense monitoring for toxicity as deemed appropriate base on current medication side effects and pharmacodynamically determined drug 1/2 lives.    The following regarding medications was addressed during rounds with patient:   the risks and benefits of the proposed medication. The patient was given the opportunity to ask questions. Informed consent given to the use of the above medications. Will continue to adjust psychiatric and non-psychiatric medications (see above "medication" section and orders section for  details) as deemed appropriate & based upon diagnoses and response to treatment.     I will continue to order blood tests/labs and diagnostic tests as deemed appropriate and review results as they become available (see orders for details and above listed lab/test results).    I will order psychiatric records from previous psych hospitals to further elucidate the nature of patient's psychopathology and review once available.    I will gather additional collateral information from friends, family and o/p treatment team to further elucidate the nature of patient's psychopathology and baselline level of psychiatric functioning.         I certify that this patient's inpatient psychiatric hospital services furnished since the previous certification were, and continue to be, required for treatment that could reasonably be expected to improve the patient's condition, or for diagnostic study, and that the patient continues to need, on a daily basis, active treatment furnished directly by or requiring the supervision of inpatient psychiatric facility personnel. In addition, the hospital records show that services furnished were intensive treatment services, admission or related services, or equivalent services.    EXPECTED DISCHARGE DATE/DAY: TBD     DISPOSITION: Home       Signed By:   Adela Lank, MD  05/07/2015

## 2015-05-07 NOTE — Progress Notes (Signed)
Problem: Falls - Risk of  Goal: *Absence of falls  Outcome: Progressing Towards Goal  Patient has been resting in their room with their eyes closed and has showed no signs of distress through out the shift. Patient slept for 7 hours. Patient is on every 15 minute checks for safety.

## 2015-05-07 NOTE — Progress Notes (Signed)
Problem: Altered Thought Process (Adult/Pediatric)  Goal: *STG: Remains safe in hospital  Client came to the desk demanding a room change that he was not going to stay in the room with "him".  When questioned he went on to say that he has had relatives killed by them and he is not going to share the room with crack heads. When explained that he would have a roommate, that the next may be worse than who he has now, demanded a new room and no roommate at all.  Explained that all our rooms had roommates and his answer was we will see about that.  Will keep close I on pt and his interactions with others on the unit.  Also stated he felt he was locked up and does not like feeling that way.  Suggested he have that conversation with his tx team in the morning.

## 2015-05-07 NOTE — Progress Notes (Signed)
Laboratory monitoring for mood stabilizer and antipsychotics:    Recommended baseline monitoring has been completed for this patient.Labs from transferring facility; EtOH/UDS was negative, WBC 12.75, PLT 298, Na 139, K 3.4, SCr= 0.82, LFTs are ok. Lithium levels will be taken at steady state.       The patient is currently taking the following medication(s):   Current Facility-Administered Medications   Medication Dose Route Frequency   ??? lithium carbonate capsule 600 mg  600 mg Oral QHS   ??? QUEtiapine (SEROquel) tablet 200 mg  200 mg Oral QHS           Renal Function, Hepatic Function and Chemistry  CrCl cannot be calculated (Unknown ideal weight.).    Lab Results   Component Value Date/Time    SODIUM 141 05/05/2015 06:00 AM    POTASSIUM 3.5 05/05/2015 06:00 AM    CHLORIDE 106 05/05/2015 06:00 AM    CO2 27 05/05/2015 06:00 AM    ANION GAP 8 05/05/2015 06:00 AM    GLUCOSE 130 05/05/2015 06:00 AM    BUN 8 05/05/2015 06:00 AM    CREATININE 0.70 05/05/2015 06:00 AM    BUN/CREATININE RATIO 11 05/05/2015 06:00 AM    GFR EST AA >60 05/05/2015 06:00 AM    GFR EST NON-AA >60 05/05/2015 06:00 AM    CALCIUM 8.3 05/05/2015 06:00 AM    ALT 44 05/05/2015 06:00 AM    AST 32 05/05/2015 06:00 AM    ALK. PHOSPHATASE 78 05/05/2015 06:00 AM    PROTEIN, TOTAL 6.6 05/05/2015 06:00 AM    ALBUMIN 3.2 05/05/2015 06:00 AM    GLOBULIN 3.4 05/05/2015 06:00 AM    A-G RATIO 0.9 05/05/2015 06:00 AM    BILIRUBIN, TOTAL 0.5 05/05/2015 06:00 AM       Lab Results   Component Value Date/Time    GLUCOSE 130 05/05/2015 06:00 AM     Lipids  Lab Results   Component Value Date/Time    CHOLESTEROL, TOTAL 157 05/05/2015 06:00 AM    HDL CHOLESTEROL 41 05/05/2015 06:00 AM    LDL, CALCULATED 102.8 05/05/2015 06:00 AM    TRIGLYCERIDE 66 05/05/2015 06:00 AM    CHOL/HDL RATIO 3.8 05/05/2015 06:00 AM       Thyroid Function    Lab Results   Component Value Date/Time    TSH 0.48 05/05/2015 06:00 AM     Vitals  Visit Vitals   Item Reading   ??? BP 130/68 mmHg    ??? Pulse 58   ??? Temp 96.2 ??F (35.7 ??C)   ??? Resp [place], PharmD, [place] (pharmacy)

## 2015-05-07 NOTE — Behavioral Health Treatment Team (Cosign Needed)
GROUP THERAPY PROGRESS NOTE    The patient Corey Crawford a 69 y.o. male is participating in Coping Skills Group.     Group time: 45 minutes    Personal goal for participation: To participate in coping skills memory game    Goal orientation:  personal    Group therapy participation: active    Therapeutic interventions reviewed and discussed: positive coping skills a-z    Impression of participation:  The patient was attentive.    BEVERLY S BAKER  05/07/2015  2:03 PM

## 2015-05-08 MED FILL — MAPAP (ACETAMINOPHEN) 325 MG TABLET: 325 mg | ORAL | Qty: 2

## 2015-05-08 MED FILL — SEROQUEL 200 MG TABLET: 200 mg | ORAL | Qty: 1

## 2015-05-08 MED FILL — ATENOLOL 50 MG TAB: 50 mg | ORAL | Qty: 1

## 2015-05-08 MED FILL — LITHIUM CARBONATE 300 MG CAP: 300 mg | ORAL | Qty: 3

## 2015-05-08 MED FILL — AMLODIPINE 5 MG TAB: 5 mg | ORAL | Qty: 2

## 2015-05-08 NOTE — Progress Notes (Signed)
Problem: Altered Thought Process (Adult/Pediatric)  Goal: *STG: Remains safe in hospital  clilent has been much quieter this pm.  No fussing and yelling about "those people".  Spent some time in the dayroom, writing and drawing.  Stayed to himself away from other group who were laughing and joking and playing games together.  Meal and med compliant, q 15 min checks for safety continue.

## 2015-05-08 NOTE — Behavioral Health Treatment Team (Cosign Needed)
GROUP THERAPY PROGRESS NOTE    The patient Corey Crawford a 69 y.o. male is participating in Coping Skills Group.     Group time: 45 minutes    Personal goal for participation: To participate in mental health journey game    Goal orientation:  personal    Group therapy participation: active    Therapeutic interventions reviewed and discussed: choices in recovery    Impression of participation:  The patient was attentive.    BEVERLY S BAKER  05/08/2015  1:12 PM

## 2015-05-08 NOTE — Progress Notes (Signed)
Problem: Altered Thought Process (Adult/Pediatric)  Goal: *STG: Complies with medication therapy  Outcome: Resolved/Met Date Met:  05/08/15  Patient is compliant with medication therapy. Medication education done.  Goal: *STG: Demonstrates ability to understand and use improved judgment in daily activities and relationships  Outcome: Progressing Towards Goal  Patient was concerned about blood pressure being up, stating he has not been bothered with HTN before admission. We discussed his present stressors and the need to learn how to use coping skills. Patient has been visible in the milieu. Patient has been interacting with peers and staff. Patient has been actively participating in groups.

## 2015-05-08 NOTE — Behavioral Health Treatment Team (Cosign Needed)
GROUP THERAPY PROGRESS NOTE    The patient Corey Crawford a 69 y.o. male is participating in Creative Expression Group.     Group time: 1 hour    Personal goal for participation: To concentrate on selected task    Goal orientation: social    Group therapy participation: active    Therapeutic interventions reviewed and discussed: Crafts, games, music    Impression of participation: The patient was attentive.    BEVERLY S BAKER  05/08/2015  4:44 PM

## 2015-05-08 NOTE — Behavioral Health Treatment Team (Signed)
Remained in bed resting quietly x 7hrs , monitored q15 minutes

## 2015-05-08 NOTE — Progress Notes (Signed)
Corey Crawford share he like to be called Corey Crawford, he actively participated in Spirituality Group about the Presence of God on Acute Behavioral Health unit  Chaplain Coletta Memos [facility]  Chaplain Paging Service [phone] -PRAY [phone])

## 2015-05-08 NOTE — Behavioral Health Treatment Team (Signed)
PSYCHIATRIC PROGRESS NOTE         Patient Name  Corey Crawford   Date of Birth 04/17/1946   CSN [id number]   Medical Record Number  [id number]      Age  69 y.o.   PCP Phys Other, MD   Admit date:  05/04/2015    Room Number  309/02  @ [facility]   Date of Service  05/08/2015          PSYCHOTHERAPY SESSION NOTE:  Length of psychotherapy session: 20 minutes    Main condition/diagnosis/issues treated during session today, 05/08/2015 : compliance/ psych medications    I employed  supportive psychotherapy in regards to various ongoing psychosocial stressors, including the following: pre-admission and current problems; housing issues; medical issues; and stress of hospitalization. Interpersonal relationship issues and psychodynamic conflicts explored.  Attempts made to alleviate maladaptive patterns. We, also, worked on issues of denial & effects of substance dependency/use     Overall, patient is not progressing    Treatment Plan Update (reviewed an updated 05/08/2015) :  I will modify psychotherapy tx plan by implementing more stress management strategies, building upon cognitive behavioral techniques, increasing coping skills, as well as shoring up psychological defenses).    An extended energy and skill set was needed to engage pt in psychotherapy due to some of the following: resistiveness, complexity, negativity, confrontational nature, hostile behaviors, and/or severe abnormalities in thought processes/psychosis resulting in the loss of expressive/receptive language communication skills.                            E & M PROGRESS NOTE:         HISTORY       CC:  "I am going to face the wall"  HISTORY OF PRESENT ILLNESS/INTERVAL HISTORY:  (reviewed/updated 05/08/2015).  In summary, Corey Crawford, is a 69 y.o.  male who presents with a severe exacerbation of the principal diagnosis, Bipolar disorder with psychotic features (HCC), which is improving and not stable. Patient requires  continued inpatient hospitalization for further stabilization, safety monitoring and medication management.  I will continue to coordinate the provision of individual, milieu, occupational, group, and substance abuse therapies to address target symptoms/diagnoses as deemed appropriate for the individual patient.  A coordinated, multidisplinary treatment team round was conducted with the patient (this team consists of the nurse, psychiatric unit pharmcist, Administrator).     per initial evaluation: The patient, Corey Crawford, is a 69 y.o.?? WHITE OR CAUCASIAN male with a past psychiatric history significant for chronic mental illness , who presents at this time for a severe exacerbation of the principle diagnosis of Bipolar disorder with psychotic features (HCC). Patient reports/evidences the following emotional symptoms:?? agitation, mania and psychosis.?? Additional symptomatology include agitation, anger outbursts, difficulty sleeping, increased irritability and problem with medication.?? The above symptoms have been present for . These symptoms are of severe in?? severity per patient's report. The symptoms are constant?? fleeting in nature.?? The patient's condition has been precipitated by and psychosocial stressors( multiple losses,death in family,diagnosed with colon cancer ).?? Patient's condition made worse by treatment noncompliance. UDS negative, BAL=0. ??  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     Corey Crawford presents/reports/evidences the following emotional symptoms today, 05/08/2015:  suicidal thoughts/threats, agitation, anxiety, hypomania and psychosis.  The above symptoms have been present for few weeks. These symptoms are of moderate severity. The symptoms are constant /intermittent in nature. Additional symptomatology include difficulty sleeping, increased irritability and poor concentration. Pt was seen with the team. He initially refused to sit but later on did. Mildly grandiose. Agreed with d/c plan.        SIDE EFFECTS: (reviewed/updated 05/08/2015)  None reported or admitted to.  No noted toxicity with use of lithium   ALLERGIES:(reviewed/updated 05/08/2015)  No Known Allergies   MEDICATIONS PRIOR TO ADMISSION:(reviewed/updated 05/08/2015)  Prescriptions prior to admission   Medication Sig   ??? lithium carbonate 600 mg capsule Take 600 mg by mouth nightly. Indications: BIPOLAR DISORDER   ??? atenolol (TENORMIN) 25 mg tablet Take 25 mg by mouth daily. Indications: HYPERTENSION   ??? amLODIPine (NORVASC) 10 mg tablet Take 10 mg by mouth daily. Indications: HYPERTENSION   ??? haloperidol (HALDOL) 2 mg tablet Take 2 mg by mouth nightly. Indications: BIIPOLAR DISORDER   ??? diphenhydrAMINE (BENADRYL) 25 mg capsule Take 25 mg by mouth nightly.      PAST MEDICAL HISTORY: Past medical history from the initial psychiatric evaluation has been reviewed (reviewed/updated 05/08/2015) with no additional updates (I asked patient and no additional past medical history provided). No past medical history on file.No past surgical history on file.   SOCIAL HISTORY: Social history from the initial psychiatric evaluation has been reviewed (reviewed/updated 05/08/2015) with no additional updates (I asked patient and no additional social history provided).   History     Social History   ??? Marital Status: LEGALLY SEPARATED     Spouse Name: N/A   ??? Number of Children: N/A   ??? Years of Education: N/A     Occupational History    ??? Not on file.     Social History Main Topics   ??? Smoking status: Not on file   ??? Smokeless tobacco: Not on file   ??? Alcohol Use: Not on file   ??? Drug Use: Not on file   ??? Sexual Activity: Not on file     Other Topics Concern   ??? Not on file     Social History Narrative   ??? No narrative on file      FAMILY HISTORY: Family history from the initial psychiatric evaluation has been reviewed (reviewed/updated 05/08/2015) with no additional updates (I asked patient and no additional family history provided). No family history on file.    REVIEW OF SYSTEMS: (reviewed/updated 05/08/2015)  Appetite:good   Sleep: good   All other Review of Systems:      General ROS: positive for  - fatigue and sleep disturbance  Psychological ROS: positive for - anxiety, irritability and sleep disturbances  negative for - suicidal ideation  Respiratory ROS: no cough, shortness of breath, or wheezing  Cardiovascular ROS: no chest pain or dyspnea on exertion         MENTAL STATUS EXAM & VITALS     MENTAL STATUS EXAM (MSE):    MSE FINDINGS ARE WITHIN NORMAL LIMITS (WNL) UNLESS OTHERWISE STATED BELOW. ( ALL OF THE BELOW CATEGORIES OF THE MSE HAVE BEEN REVIEWED (reviewed 05/08/2015) AND UPDATED AS DEEMED  APPROPRIATE )  General Presentation age appropriate, casually dressed and older than stated age, unreliable and vague   Orientation oriented to time, place and person   Vital Signs  See below (reviewed 05/08/2015); Vital Signs (BP, Pulse, & Temp) are within normal limits if not listed below.   Gait and Station Stable/steady, no ataxia   Musculoskeletal System No extrapyramidal symptoms (EPS); no abnormal muscular movements or Tardive Dyskinesia (TD); muscle strength and tone are within normal limits   Language No aphasia or dysarthria   Speech:  normal pitch and normal volume   Thought Processes illogical, wnl rate of thoughts, poor abstract reasoning and computation   Thought Associations goal directed and logical    Thought Content free of delusions and free of hallucinations   Suicidal Ideations none   Homicidal Ideations none   Mood:  euthymic   Affect:  full range   Memory recent  adequate   Memory remote:  adequate   Concentration/Attention:  impaired   Fund of Knowledge Below average   Insight:  limited   Reliability poor   Judgment:  limited          VITALS:     Patient Vitals for the past 24 hrs:   Temp Pulse Resp BP   05/07/15 1608 98.7 ??F (37.1 ??C) (!) 52 16 121/62 mmHg   05/07/15 1222 - (!) 51 18 126/46 mmHg     Wt Readings from Last 3 Encounters:   No data found for Wt     Temp Readings from Last 3 Encounters:   05/07/15 98.7 ??F (37.1 ??C)      BP Readings from Last 3 Encounters:   05/07/15 121/62     Pulse Readings from Last 3 Encounters:   05/07/15 52            DATA     LABORATORY DATA:(reviewed/updated 05/08/2015)  No results found for this or any previous visit (from the past 24 hour(s)).  No results found for: VALF2, VALAC, VALP, VALPR, DS6, CRBAM, CRBAMP, CARB2, XCRBAM  No results found for: LI, LIH, LITHM   RADIOLOGY REPORTS:(reviewed/updated 05/08/2015)  No results found.       MEDICATIONS     ALL MEDICATIONS:   Current Facility-Administered Medications   Medication Dose Route Frequency   ??? atenolol (TENORMIN) tablet 25 mg  25 mg Oral DAILY   ??? amLODIPine (NORVASC) tablet 10 mg  10 mg Oral DAILY   ??? lithium carbonate capsule 900 mg  900 mg Oral QHS   ??? QUEtiapine (SEROquel) tablet 200 mg  200 mg Oral QHS   ??? benztropine (COGENTIN) tablet 1 mg  1 mg Oral BID PRN   ??? benztropine (COGENTIN) injection 1 mg  1 mg IntraMUSCular Q12H PRN   ??? LORazepam (ATIVAN) injection 0.5 mg  0.5 mg IntraMUSCular Q4H PRN   ??? LORazepam (ATIVAN) tablet 0.5 mg  0.5 mg Oral Q4H PRN   ??? zolpidem (AMBIEN) tablet 5 mg  5 mg Oral QHS PRN   ??? acetaminophen (TYLENOL) tablet 650 mg  650 mg Oral Q4H PRN   ??? magnesium hydroxide (MILK OF MAGNESIA) oral suspension 30 mL  30 mL Oral DAILY PRN    ??? nicotine (NICODERM CQ) 21 mg/24 hr patch 1 Patch  1 Patch TransDERmal DAILY PRN      SCHEDULED MEDICATIONS:   Current Facility-Administered Medications   Medication Dose Route Frequency   ??? atenolol (TENORMIN) tablet 25 mg  25 mg Oral DAILY   ??? amLODIPine (  NORVASC) tablet 10 mg  10 mg Oral DAILY   ??? lithium carbonate capsule 900 mg  900 mg Oral QHS   ??? QUEtiapine (SEROquel) tablet 200 mg  200 mg Oral QHS          ASSESSMENT & PLAN     DIAGNOSES REQUIRING ACTIVE TREATMENT AND MONITORING: (reviewed/updated 05/08/2015)  Patient Active Hospital Problem List:   Bipolar disorder with psychotic features (HCC) (05/05/2015)    Assessment: hypomanic, loose with FOI. Poor insight    Plan: continue to titrate Li and Seroquel. Li level on Friday.    Coarse tremors (05/05/2015)    Assessment: stable    Plan: continue to mnoitor   Hypertension (05/05/2015)    Assessment: stable    Plan: monitor. Get collatera            Complete current electronic health record for patient has been reviewed today including consultant notes, ancillary staff notes, nurses and psychiatric tech notes.    I will continue to monitor blood levels ( lithium---a drug with a narrow therapeutic index= NTI) and associated labs for drug therapy implemented that require intense monitoring for toxicity as deemed appropriate base on current medication side effects and pharmacodynamically determined drug 1/2 lives.    The following regarding medications was addressed during rounds with patient:   the risks and benefits of the proposed medication. The patient was given the opportunity to ask questions. Informed consent given to the use of the above medications. Will continue to adjust psychiatric and non-psychiatric medications (see above "medication" section and orders section for details) as deemed appropriate & based upon diagnoses and response to treatment.     I will continue to order blood tests/labs and diagnostic tests as deemed  appropriate and review results as they become available (see orders for details and above listed lab/test results).    I will order psychiatric records from previous psych hospitals to further elucidate the nature of patient's psychopathology and review once available.    I will gather additional collateral information from friends, family and o/p treatment team to further elucidate the nature of patient's psychopathology and baselline level of psychiatric functioning.         I certify that this patient's inpatient psychiatric hospital services furnished since the previous certification were, and continue to be, required for treatment that could reasonably be expected to improve the patient's condition, or for diagnostic study, and that the patient continues to need, on a daily basis, active treatment furnished directly by or requiring the supervision of inpatient psychiatric facility personnel. In addition, the hospital records show that services furnished were intensive treatment services, admission or related services, or equivalent services.    EXPECTED DISCHARGE DATE/DAY: Monday     DISPOSITION: Home  Psych FU with in [place] ?       Signed By:   Adela Lank, MD  05/08/2015

## 2015-05-09 MED FILL — LITHIUM CARBONATE 300 MG CAP: 300 mg | ORAL | Qty: 3

## 2015-05-09 MED FILL — ATENOLOL 50 MG TAB: 50 mg | ORAL | Qty: 1

## 2015-05-09 MED FILL — AMLODIPINE 5 MG TAB: 5 mg | ORAL | Qty: 2

## 2015-05-09 MED FILL — SEROQUEL 200 MG TABLET: 200 mg | ORAL | Qty: 1

## 2015-05-09 NOTE — Behavioral Health Treatment Team (Cosign Needed)
GROUP THERAPY PROGRESS NOTE    The patient Corey Crawford a 69 y.o. male is participating in Coping Skills Group.     Group time: 45 minutes    Personal goal for participation: To develop a personal plan for success    Goal orientation:  personal    Group therapy participation: active    Therapeutic interventions reviewed and discussed: positive coping strategies    Impression of participation:  The patient was attentive.    BEVERLY S BAKER  05/09/2015  1:34 PM

## 2015-05-09 NOTE — Progress Notes (Signed)
Problem: Altered Thought Process (Adult/Pediatric)  Goal: *STG: Attends activities and groups  Outcome: Progressing Towards Goal  Patient has been calmer,less agitated this evening. Sitting in dayroom coloring and interacting with his peers. Med and meal compliant,attends groups, and takes part in reading magazines and paperback books.Continue safety checks.

## 2015-05-09 NOTE — Progress Notes (Signed)
Problem: Altered Thought Process (Adult/Pediatric)  Goal: *STG: Remains safe in hospital  Outcome: Progressing Towards Goal  Pt participated in treatment team,cooperative.Alert.Pt disorganized.Compliant with meals and medications.Concerned about his discharge plans.Contracts for safety.Visible in the dayroom.Participates in groups and activities.Will continue safety checks and anticipate needs.

## 2015-05-09 NOTE — Behavioral Health Treatment Team (Signed)
PSYCHIATRIC PROGRESS NOTE         Patient Name  Corey Crawford   Date of Birth 27-Nov-1945   CSN [id number]   Medical Record Number  [id number]      Age  69 y.o.   PCP Phys Other, MD   Admit date:  05/04/2015    Room Number  309/02  @ [facility]   Date of Service  05/09/2015          PSYCHOTHERAPY SESSION NOTE:  Length of psychotherapy session: 20 minutes    Main condition/diagnosis/issues treated during session today, 05/09/2015 : compliance/ psych medications    I employed  supportive psychotherapy in regards to various ongoing psychosocial stressors, including the following: pre-admission and current problems; housing issues; medical issues; and stress of hospitalization. Interpersonal relationship issues and psychodynamic conflicts explored.  Attempts made to alleviate maladaptive patterns. We, also, worked on issues of denial & effects of substance dependency/use     Overall, patient is slowly progressing    Treatment Plan Update (reviewed an updated 05/09/2015) :  I will modify psychotherapy tx plan by implementing more stress management strategies, building upon cognitive behavioral techniques, increasing coping skills, as well as shoring up psychological defenses).    An extended energy and skill set was needed to engage pt in psychotherapy due to some of the following: resistiveness, complexity, negativity, confrontational nature, hostile behaviors, and/or severe abnormalities in thought processes/psychosis resulting in the loss of expressive/receptive language communication skills.                            E & M PROGRESS NOTE:         HISTORY       CC:  "I am going to face the wall"  HISTORY OF PRESENT ILLNESS/INTERVAL HISTORY:  (reviewed/updated 05/09/2015).  In summary, Corey Crawford, is a 69 y.o.  male who presents with a severe exacerbation of the principal diagnosis, Bipolar disorder with psychotic features (HCC), which is improving and not stable. Patient requires  continued inpatient hospitalization for further stabilization, safety monitoring and medication management.  I will continue to coordinate the provision of individual, milieu, occupational, group, and substance abuse therapies to address target symptoms/diagnoses as deemed appropriate for the individual patient.  A coordinated, multidisplinary treatment team round was conducted with the patient (this team consists of the nurse, psychiatric unit pharmcist, Administrator).     per initial evaluation: The patient, Corey Crawford, is a 69 y.o.?? WHITE OR CAUCASIAN male with a past psychiatric history significant for chronic mental illness , who presents at this time for a severe exacerbation of the principle diagnosis of Bipolar disorder with psychotic features (HCC). Patient reports/evidences the following emotional symptoms:?? agitation, mania and psychosis.?? Additional symptomatology include agitation, anger outbursts, difficulty sleeping, increased irritability and problem with medication.?? The above symptoms have been present for . These symptoms are of severe in?? severity per patient's report. The symptoms are constant?? fleeting in nature.?? The patient's condition has been precipitated by and psychosocial stressors( multiple losses,death in family,diagnosed with colon cancer ).?? Patient's condition made worse by treatment noncompliance. UDS negative, BAL=0. ??  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     Corey Crawford presents/reports/evidences the following emotional symptoms today, 05/09/2015:  suicidal thoughts/threats, agitation, anxiety, hypomania and psychosis.  The above symptoms have been present for few weeks. These symptoms are of moderate severity. The symptoms are constant /intermittent in nature. Additional symptomatology include difficulty sleeping, increased irritability and poor concentration.   Pt was seen with the team. Mildly grandiose. Mild disorganization of thoughts.  Repeatedly asking to leave earlier than Monday. Compliant with medications.       SIDE EFFECTS: (reviewed/updated 05/09/2015)  None reported or admitted to.  No noted toxicity with use of lithium   ALLERGIES:(reviewed/updated 05/09/2015)  No Known Allergies   MEDICATIONS PRIOR TO ADMISSION:(reviewed/updated 05/09/2015)  Prescriptions prior to admission   Medication Sig   ??? lithium carbonate 600 mg capsule Take 600 mg by mouth nightly. Indications: BIPOLAR DISORDER   ??? atenolol (TENORMIN) 25 mg tablet Take 25 mg by mouth daily. Indications: HYPERTENSION   ??? amLODIPine (NORVASC) 10 mg tablet Take 10 mg by mouth daily. Indications: HYPERTENSION   ??? haloperidol (HALDOL) 2 mg tablet Take 2 mg by mouth nightly. Indications: BIIPOLAR DISORDER   ??? diphenhydrAMINE (BENADRYL) 25 mg capsule Take 25 mg by mouth nightly.      PAST MEDICAL HISTORY: Past medical history from the initial psychiatric evaluation has been reviewed (reviewed/updated 05/09/2015) with no additional updates (I asked patient and no additional past medical history provided). No past medical history on file.No past surgical history on file.   SOCIAL HISTORY: Social history from the initial psychiatric evaluation has been reviewed (reviewed/updated 05/09/2015) with no additional updates (I asked patient and no additional social history provided).   History     Social History   ??? Marital Status: LEGALLY SEPARATED     Spouse Name: N/A   ??? Number of Children: N/A    ??? Years of Education: N/A     Occupational History   ??? Not on file.     Social History Main Topics   ??? Smoking status: Not on file   ??? Smokeless tobacco: Not on file   ??? Alcohol Use: Not on file   ??? Drug Use: Not on file   ??? Sexual Activity: Not on file     Other Topics Concern   ??? Not on file     Social History Narrative   ??? No narrative on file      FAMILY HISTORY: Family history from the initial psychiatric evaluation has been reviewed (reviewed/updated 05/09/2015) with no additional updates (I asked patient and no additional family history provided). No family history on file.    REVIEW OF SYSTEMS: (reviewed/updated 05/09/2015)  Appetite:good   Sleep: good   All other Review of Systems:      General ROS: positive for  - fatigue and sleep disturbance  Psychological ROS: positive for - anxiety, irritability and sleep disturbances  negative for - suicidal ideation  Respiratory ROS: no cough, shortness of breath, or wheezing  Cardiovascular ROS: no chest pain or dyspnea on exertion         MENTAL STATUS EXAM & VITALS     MENTAL STATUS EXAM (MSE):    MSE FINDINGS ARE WITHIN NORMAL LIMITS (WNL) UNLESS OTHERWISE STATED BELOW. ( ALL OF THE BELOW CATEGORIES OF THE MSE HAVE BEEN REVIEWED (reviewed 05/09/2015) AND  UPDATED AS DEEMED APPROPRIATE )  General Presentation age appropriate, casually dressed and older than stated age, unreliable and vague   Orientation oriented to time, place and person   Vital Signs  See below (reviewed 05/09/2015); Vital Signs (BP, Pulse, & Temp) are within normal limits if not listed below.   Gait and Station Stable/steady, no ataxia   Musculoskeletal System No extrapyramidal symptoms (EPS); no abnormal muscular movements or Tardive Dyskinesia (TD); muscle strength and tone are within normal limits   Language No aphasia or dysarthria   Speech:  normal pitch and normal volume   Thought Processes illogical, wnl rate of thoughts, poor abstract reasoning and computation    Thought Associations goal directed and logical   Thought Content free of delusions and free of hallucinations   Suicidal Ideations none   Homicidal Ideations none   Mood:  euthymic   Affect:  full range   Memory recent  adequate   Memory remote:  adequate   Concentration/Attention:  impaired   Fund of Knowledge Below average   Insight:  limited   Reliability poor   Judgment:  limited          VITALS:     Patient Vitals for the past 24 hrs:   Temp Pulse Resp BP SpO2   05/09/15 0659 - 63 18 129/60 mmHg -   05/09/15 0533 98 ??F (36.7 ??C) (!) 57 18 187/77 mmHg -   05/08/15 1231 98 ??F (36.7 ??C) (!) 55 18 133/60 mmHg 98 %     Wt Readings from Last 3 Encounters:   No data found for Wt     Temp Readings from Last 3 Encounters: 05/09/15 98 ??F (36.7 ??C)      BP Readings from Last 3 Encounters:   05/09/15 129/60     Pulse Readings from Last 3 Encounters:   05/09/15 63            DATA     LABORATORY DATA:(reviewed/updated 05/09/2015)  No results found for this or any previous visit (from the past 24 hour(s)).  No results found for: VALF2, VALAC, VALP, VALPR, DS6, CRBAM, CRBAMP, CARB2, XCRBAM  No results found for: LI, LIH, LITHM   RADIOLOGY REPORTS:(reviewed/updated 05/09/2015)  No results found.       MEDICATIONS     ALL MEDICATIONS:   Current Facility-Administered Medications   Medication Dose Route Frequency   ??? atenolol (TENORMIN) tablet 25 mg  25 mg Oral DAILY   ??? amLODIPine (NORVASC) tablet 10 mg  10 mg Oral DAILY   ??? lithium carbonate capsule 900 mg  900 mg Oral QHS   ??? QUEtiapine (SEROquel) tablet 200 mg  200 mg Oral QHS   ??? benztropine (COGENTIN) tablet 1 mg  1 mg Oral BID PRN   ??? benztropine (COGENTIN) injection 1 mg  1 mg IntraMUSCular Q12H PRN   ??? LORazepam (ATIVAN) injection 0.5 mg  0.5 mg IntraMUSCular Q4H PRN   ??? LORazepam (ATIVAN) tablet 0.5 mg  0.5 mg Oral Q4H PRN   ??? zolpidem (AMBIEN) tablet 5 mg  5 mg Oral QHS PRN   ??? acetaminophen (TYLENOL) tablet 650 mg  650 mg Oral Q4H PRN    ??? magnesium hydroxide (MILK OF MAGNESIA) oral suspension 30 mL  30 mL Oral DAILY PRN   ??? nicotine (NICODERM CQ) 21 mg/24 hr patch 1 Patch  1 Patch TransDERmal DAILY PRN      SCHEDULED MEDICATIONS:   Current Facility-Administered Medications   Medication Dose Route  Frequency   ??? atenolol (TENORMIN) tablet 25 mg  25 mg Oral DAILY   ??? amLODIPine (NORVASC) tablet 10 mg  10 mg Oral DAILY   ??? lithium carbonate capsule 900 mg  900 mg Oral QHS   ??? QUEtiapine (SEROquel) tablet 200 mg  200 mg Oral QHS          ASSESSMENT & PLAN     DIAGNOSES REQUIRING ACTIVE TREATMENT AND MONITORING: (reviewed/updated 05/09/2015)  Patient Active Hospital Problem List:   Bipolar disorder with psychotic features (HCC) (05/05/2015)    Assessment: hypomanic, loose with FOI. Poor insight    Plan: continue to titrate Li and Seroquel. Li level on Friday.    Coarse tremors (05/05/2015)    Assessment: stable    Plan: continue to mnoitor   Hypertension (05/05/2015)    Assessment: stable    Plan: monitor. Get collatera            Complete current electronic health record for patient has been reviewed today including consultant notes, ancillary staff notes, nurses and psychiatric tech notes.    I will continue to monitor blood levels ( lithium---a drug with a narrow therapeutic index= NTI) and associated labs for drug therapy implemented that require intense monitoring for toxicity as deemed appropriate base on current medication side effects and pharmacodynamically determined drug 1/2 lives.    The following regarding medications was addressed during rounds with patient:   the risks and benefits of the proposed medication. The patient was given the opportunity to ask questions. Informed consent given to the use of the above medications. Will continue to adjust psychiatric and non-psychiatric medications (see above "medication" section and orders section for details) as deemed appropriate & based upon diagnoses and response to treatment.      I will continue to order blood tests/labs and diagnostic tests as deemed appropriate and review results as they become available (see orders for details and above listed lab/test results).    I will order psychiatric records from previous psych hospitals to further elucidate the nature of patient's psychopathology and review once available.    I will gather additional collateral information from friends, family and o/p treatment team to further elucidate the nature of patient's psychopathology and baselline level of psychiatric functioning.         I certify that this patient's inpatient psychiatric hospital services furnished since the previous certification were, and continue to be, required for treatment that could reasonably be expected to improve the patient's condition, or for diagnostic study, and that the patient continues to need, on a daily basis, active treatment furnished directly by or requiring the supervision of inpatient psychiatric facility personnel. In addition, the hospital records show that services furnished were intensive treatment services, admission or related services, or equivalent services.    EXPECTED DISCHARGE DATE/DAY: Monday     DISPOSITION: Home  Psych FU with in [place] ?       Signed By:   Adela Lank, MD  05/09/2015

## 2015-05-09 NOTE — Behavioral Health Treatment Team (Cosign Needed)
GROUP THERAPY PROGRESS NOTE    The patient Corey Crawford a 69 y.o. male is participating in Creative Expression Group.     Group time: 1 hour    Personal goal for participation: To concentrate on selected task    Goal orientation: social    Group therapy participation: active    Therapeutic interventions reviewed and discussed: Crafts, games, music    Impression of participation: The patient was attentive.    BEVERLY S BAKER  05/09/2015  5:06 PM

## 2015-05-09 NOTE — Progress Notes (Signed)
Problem: Altered Thought Process (Adult/Pediatric)  Goal: *STG: Remains safe in hospital  Outcome: Progressing Towards Goal  Patient has been resting in their room with their eyes closed and has showed no signs of distress through out the shift. Patient slept for 7 hours. Patient is on every 15 minute checks for safety.

## 2015-05-09 NOTE — Progress Notes (Signed)
Please record pt's ht and wt in chart for nutrition assessment.  Thank you.

## 2015-05-09 NOTE — Behavioral Health Treatment Team (Cosign Needed)
GROUP THERAPY PROGRESS NOTE    Corey Crawford is participating in [employer].     Group time: 30 minutes    Personal goal for participation:  Unit orientation    Goal orientation: Community    Group therapy participation: active    Therapeutic interventions reviewed and discussed: Yes    Impression of participation:  Good

## 2015-05-10 LAB — LITHIUM: Lithium level: 0.62 MMOL/L (ref 0.60–1.20)

## 2015-05-10 MED FILL — ATENOLOL 50 MG TAB: 50 mg | ORAL | Qty: 1

## 2015-05-10 MED FILL — LITHIUM CARBONATE 300 MG CAP: 300 mg | ORAL | Qty: 3

## 2015-05-10 MED FILL — AMLODIPINE 5 MG TAB: 5 mg | ORAL | Qty: 2

## 2015-05-10 MED FILL — SEROQUEL 200 MG TABLET: 200 mg | ORAL | Qty: 1

## 2015-05-10 NOTE — Progress Notes (Signed)
Laboratory monitoring for mood stabilizer and antipsychotics:    Lithium = 0.62 mmol/L today at steady state. Same dose was continued.       The patient is currently taking the following medication(s):   Current Facility-Administered Medications   Medication Dose Route Frequency   ??? atenolol (TENORMIN) tablet 25 mg  25 mg Oral DAILY   ??? amLODIPine (NORVASC) tablet 10 mg  10 mg Oral DAILY   ??? lithium carbonate capsule 900 mg  900 mg Oral QHS   ??? QUEtiapine (SEROquel) tablet 200 mg  200 mg Oral QHS       Serum Drug Level(s)      LITHIUM  Recent Labs      05/10/15   0531   LI  0.62         Renal Function, Hepatic Function and Chemistry  CrCl cannot be calculated (Unknown ideal weight.).    Lab Results   Component Value Date/Time    SODIUM 141 05/05/2015 06:00 AM    POTASSIUM 3.5 05/05/2015 06:00 AM    CHLORIDE 106 05/05/2015 06:00 AM    CO2 27 05/05/2015 06:00 AM    ANION GAP 8 05/05/2015 06:00 AM    GLUCOSE 130 05/05/2015 06:00 AM    BUN 8 05/05/2015 06:00 AM    CREATININE 0.70 05/05/2015 06:00 AM    BUN/CREATININE RATIO 11 05/05/2015 06:00 AM    GFR EST AA >60 05/05/2015 06:00 AM    GFR EST NON-AA >60 05/05/2015 06:00 AM    CALCIUM 8.3 05/05/2015 06:00 AM    ALT 44 05/05/2015 06:00 AM    AST 32 05/05/2015 06:00 AM    ALK. PHOSPHATASE 78 05/05/2015 06:00 AM    PROTEIN, TOTAL 6.6 05/05/2015 06:00 AM    ALBUMIN 3.2 05/05/2015 06:00 AM    GLOBULIN 3.4 05/05/2015 06:00 AM    A-G RATIO 0.9 05/05/2015 06:00 AM    BILIRUBIN, TOTAL 0.5 05/05/2015 06:00 AM       Lab Results   Component Value Date/Time    GLUCOSE 130 05/05/2015 06:00 AM       Lipids  Lab Results   Component Value Date/Time    CHOLESTEROL, TOTAL 157 05/05/2015 06:00 AM    HDL CHOLESTEROL 41 05/05/2015 06:00 AM    LDL, CALCULATED 102.8 05/05/2015 06:00 AM    TRIGLYCERIDE 66 05/05/2015 06:00 AM    CHOL/HDL RATIO 3.8 05/05/2015 06:00 AM       Thyroid Function    Lab Results   Component Value Date/Time    TSH 0.48 05/05/2015 06:00 AM       Vitals  Visit Vitals    Item Reading   ??? BP 152/64 mmHg   ??? Pulse 54   ??? Temp 97.7 ??F (36.5 ??C)   ??? Resp 18   ??? SpO2 98%         Donell Beers, PharmD, BCPS  [phone] (pharmacy)

## 2015-05-10 NOTE — Behavioral Health Treatment Team (Cosign Needed)
GROUP THERAPY PROGRESS NOTE    The patient Binnie Living a 69 y.o. male is participating in Coping Skills Group.     Group time: 45 minutes    Personal goal for participation: To identify people and things most grateful for    Goal orientation:  personal    Group therapy participation: active    Therapeutic interventions reviewed and discussed: worksheet    Impression of participation:  The patient was attentive.    BEVERLY S BAKER  05/10/2015  12:52 PM

## 2015-05-10 NOTE — Progress Notes (Signed)
Problem: Altered Thought Process (Adult/Pediatric)  Goal: *STG: Remains safe in hospital  Outcome: Progressing Towards Goal  Patient has been resting in their room with their eyes closed and has showed no signs of distress through out the shift. Patient slept for 7 hours. Patient is on every 15 minute checks for safety.

## 2015-05-10 NOTE — Progress Notes (Signed)
Problem: Altered Thought Process (Adult/Pediatric)  Goal: *STG: Attends activities and groups  Outcome: Progressing Towards Goal  Pt is visible in the milieu. Affect is brighter today, mood calmer. Pt is meds/meals compliant, able to come to groups in the unit. Pt has participated in his treatment team meeting today. Will continue to monitor q 15 for safety, mood and behavior changes.

## 2015-05-10 NOTE — Behavioral Health Treatment Team (Signed)
GROUP THERAPY PROGRESS NOTE    Corey Crawford is participating in Nursing Group.     Group time: 20 minutes    Personal goal for participation: Education    Goal orientation: medication education    Group therapy participation: active    Therapeutic interventions reviewed and discussed: yes    Impression of participation: good

## 2015-05-10 NOTE — Behavioral Health Treatment Team (Signed)
PSYCHIATRIC PROGRESS NOTE         Patient Name  Corey Crawford   Date of Birth January 04, 1946   CSN [id number]   Medical Record Number  [id number]      Age  69 y.o.   PCP Phys Other, MD   Admit date:  05/04/2015    Room Number  309/02  @ [facility]   Date of Service  05/10/2015          PSYCHOTHERAPY SESSION NOTE:  Length of psychotherapy session: 20 minutes    Main condition/diagnosis/issues treated during session today, 05/10/2015 : compliance/ psych medications    I employed  supportive psychotherapy in regards to various ongoing psychosocial stressors, including the following: pre-admission and current problems; housing issues; medical issues; and stress of hospitalization. Interpersonal relationship issues and psychodynamic conflicts explored.  Attempts made to alleviate maladaptive patterns. We, also, worked on issues of denial & effects of substance dependency/use     Overall, patient is slowly progressing    Treatment Plan Update (reviewed an updated 05/10/2015) :  I will modify psychotherapy tx plan by implementing more stress management strategies, building upon cognitive behavioral techniques, increasing coping skills, as well as shoring up psychological defenses).    An extended energy and skill set was needed to engage pt in psychotherapy due to some of the following: resistiveness, complexity, negativity, confrontational nature, hostile behaviors, and/or severe abnormalities in thought processes/psychosis resulting in the loss of expressive/receptive language communication skills.                            E & M PROGRESS NOTE:         HISTORY       CC:  "I am going to face the wall"  HISTORY OF PRESENT ILLNESS/INTERVAL HISTORY:  (reviewed/updated 05/10/2015).  In summary, Corey Crawford, is a 69 y.o.  male who presents with a severe exacerbation of the principal diagnosis, Bipolar disorder with psychotic features (HCC), which is improving and not stable. Patient requires  continued inpatient hospitalization for further stabilization, safety monitoring and medication management.  I will continue to coordinate the provision of individual, milieu, occupational, group, and substance abuse therapies to address target symptoms/diagnoses as deemed appropriate for the individual patient.  A coordinated, multidisplinary treatment team round was conducted with the patient (this team consists of the nurse, psychiatric unit pharmcist, Administrator).     per initial evaluation: The patient, Corey Crawford, is a 69 y.o.?? WHITE OR CAUCASIAN male with a past psychiatric history significant for chronic mental illness , who presents at this time for a severe exacerbation of the principle diagnosis of Bipolar disorder with psychotic features (HCC). Patient reports/evidences the following emotional symptoms:?? agitation, mania and psychosis.?? Additional symptomatology include agitation, anger outbursts, difficulty sleeping, increased irritability and problem with medication.?? The above symptoms have been present for . These symptoms are of severe in?? severity per patient's report. The symptoms are constant?? fleeting in nature.?? The patient's condition has been precipitated by and psychosocial stressors( multiple losses,death in family,diagnosed with colon cancer ).?? Patient's condition made worse by treatment noncompliance. UDS negative, BAL=0. ??  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     Corey Crawford presents/reports/evidences the following emotional symptoms today, 05/10/2015:  suicidal thoughts/threats, agitation, anxiety, hypomania and psychosis.  The above symptoms have been present for few weeks. These symptoms are of moderate severity. The symptoms are constant /intermittent in nature. Additional symptomatology include difficulty sleeping, increased irritability and poor concentration.   Pt was seen with the team. Mildly grandiose. Mild disorganization of thoughts.Agreed with d/c plan for Monday. Compliant with medications.       SIDE EFFECTS: (reviewed/updated 05/10/2015)  None reported or admitted to.  No noted toxicity with use of lithium   ALLERGIES:(reviewed/updated 05/10/2015)  No Known Allergies   MEDICATIONS PRIOR TO ADMISSION:(reviewed/updated 05/10/2015)  Prescriptions prior to admission   Medication Sig   ??? lithium carbonate 600 mg capsule Take 600 mg by mouth nightly. Indications: BIPOLAR DISORDER   ??? atenolol (TENORMIN) 25 mg tablet Take 25 mg by mouth daily. Indications: HYPERTENSION   ??? amLODIPine (NORVASC) 10 mg tablet Take 10 mg by mouth daily. Indications: HYPERTENSION   ??? haloperidol (HALDOL) 2 mg tablet Take 2 mg by mouth nightly. Indications: BIIPOLAR DISORDER   ??? diphenhydrAMINE (BENADRYL) 25 mg capsule Take 25 mg by mouth nightly.      PAST MEDICAL HISTORY: Past medical history from the initial psychiatric evaluation has been reviewed (reviewed/updated 05/10/2015) with no additional updates (I asked patient and no additional past medical history provided). No past medical history on file.No past surgical history on file.   SOCIAL HISTORY: Social history from the initial psychiatric evaluation has been reviewed (reviewed/updated 05/10/2015) with no additional updates (I asked patient and no additional social history provided).   History     Social History   ??? Marital Status: LEGALLY SEPARATED     Spouse Name: N/A   ??? Number of Children: N/A   ??? Years of Education: N/A      Occupational History   ??? Not on file.     Social History Main Topics   ??? Smoking status: Not on file   ??? Smokeless tobacco: Not on file   ??? Alcohol Use: Not on file   ??? Drug Use: Not on file   ??? Sexual Activity: Not on file     Other Topics Concern   ??? Not on file     Social History Narrative   ??? No narrative on file      FAMILY HISTORY: Family history from the initial psychiatric evaluation has been reviewed (reviewed/updated 05/10/2015) with no additional updates (I asked patient and no additional family history provided). No family history on file.    REVIEW OF SYSTEMS: (reviewed/updated 05/10/2015)  Appetite:good   Sleep: good   All other Review of Systems:      General ROS: positive for  - fatigue and sleep disturbance  Psychological ROS: positive for - anxiety, irritability and sleep disturbances  negative for - suicidal ideation  Respiratory ROS: no cough, shortness of breath, or wheezing  Cardiovascular ROS: no chest pain or dyspnea on exertion         MENTAL STATUS EXAM & VITALS     MENTAL STATUS EXAM (MSE):    MSE FINDINGS ARE WITHIN NORMAL LIMITS (WNL) UNLESS OTHERWISE STATED BELOW. ( ALL OF THE BELOW CATEGORIES OF THE MSE HAVE BEEN REVIEWED (reviewed 05/10/2015) AND UPDATED AS DEEMED  APPROPRIATE )  General Presentation age appropriate, casually dressed and older than stated age, unreliable and vague   Orientation oriented to time, place and person   Vital Signs  See below (reviewed 05/10/2015); Vital Signs (BP, Pulse, & Temp) are within normal limits if not listed below.   Gait and Station Stable/steady, no ataxia   Musculoskeletal System No extrapyramidal symptoms (EPS); no abnormal muscular movements or Tardive Dyskinesia (TD); muscle strength and tone are within normal limits   Language No aphasia or dysarthria   Speech:  normal pitch and normal volume   Thought Processes illogical, wnl rate of thoughts, poor abstract reasoning and computation   Thought Associations goal directed and logical    Thought Content free of delusions and free of hallucinations   Suicidal Ideations none   Homicidal Ideations none Mood:  euthymic   Affect:  full range   Memory recent  adequate   Memory remote:  adequate   Concentration/Attention:  impaired   Fund of Knowledge Below average   Insight:  limited   Reliability poor   Judgment:  limited          VITALS:     Patient Vitals for the past 24 hrs:   Temp Pulse Resp BP   05/10/15 0700 97.7 ??F (36.5 ??C) (!) 54 18 152/64 mmHg   05/09/15 1625 99.3 ??F (37.4 ??C) (!) 44 18 124/58 mmHg   05/09/15 1506 - 63 18 129/60 mmHg     Wt Readings from Last 3 Encounters:   No data found for Wt     Temp Readings from Last 3 Encounters:   05/10/15 97.7 ??F (36.5 ??C)      BP Readings from Last 3 Encounters:   05/10/15 152/64     Pulse Readings from Last 3 Encounters:   05/10/15 54            DATA     LABORATORY DATA:(reviewed/updated 05/10/2015)  No results found for this or any previous visit (from the past 24 hour(s)).  No results found for: VALF2, VALAC, VALP, VALPR, DS6, CRBAM, CRBAMP, CARB2, XCRBAM  No results found for: LI, LIH, LITHM   RADIOLOGY REPORTS:(reviewed/updated 05/10/2015)  No results found.       MEDICATIONS     ALL MEDICATIONS:   Current Facility-Administered Medications   Medication Dose Route Frequency   ??? atenolol (TENORMIN) tablet 25 mg  25 mg Oral DAILY   ??? amLODIPine (NORVASC) tablet 10 mg  10 mg Oral DAILY   ??? lithium carbonate capsule 900 mg  900 mg Oral QHS   ??? QUEtiapine (SEROquel) tablet 200 mg  200 mg Oral QHS   ??? benztropine (COGENTIN) tablet 1 mg  1 mg Oral BID PRN   ??? benztropine (COGENTIN) injection 1 mg  1 mg IntraMUSCular Q12H PRN   ??? LORazepam (ATIVAN) injection 0.5 mg  0.5 mg IntraMUSCular Q4H PRN   ??? LORazepam (ATIVAN) tablet 0.5 mg  0.5 mg Oral Q4H PRN   ??? zolpidem (AMBIEN) tablet 5 mg  5 mg Oral QHS PRN   ??? acetaminophen (TYLENOL) tablet 650 mg  650 mg Oral Q4H PRN   ??? magnesium hydroxide (MILK OF MAGNESIA) oral suspension 30 mL  30 mL Oral DAILY PRN    ??? nicotine (NICODERM CQ) 21 mg/24 hr patch 1 Patch  1 Patch TransDERmal DAILY PRN      SCHEDULED MEDICATIONS:   Current Facility-Administered Medications   Medication Dose Route Frequency   ??? atenolol (TENORMIN) tablet 25  mg  25 mg Oral DAILY   ??? amLODIPine (NORVASC) tablet 10 mg  10 mg Oral DAILY   ??? lithium carbonate capsule 900 mg  900 mg Oral QHS   ??? QUEtiapine (SEROquel) tablet 200 mg  200 mg Oral QHS          ASSESSMENT & PLAN     DIAGNOSES REQUIRING ACTIVE TREATMENT AND MONITORING: (reviewed/updated 05/10/2015)  Patient Active Hospital Problem List:   Bipolar disorder with psychotic features (HCC) (05/05/2015)    Assessment: hypomanic, loose with FOI. Poor insight    Plan: continue to titrate Li and Seroquel. Li level on Friday.    Coarse tremors (05/05/2015)    Assessment: stable    Plan: continue to mnoitor   Hypertension (05/05/2015)    Assessment: stable    Plan: monitor. Get collatera            Complete current electronic health record for patient has been reviewed today including consultant notes, ancillary staff notes, nurses and psychiatric tech notes.    I will continue to monitor blood levels ( lithium---a drug with a narrow therapeutic index= NTI) and associated labs for drug therapy implemented that require intense monitoring for toxicity as deemed appropriate base on current medication side effects and pharmacodynamically determined drug 1/2 lives.    The following regarding medications was addressed during rounds with patient:   the risks and benefits of the proposed medication. The patient was given the opportunity to ask questions. Informed consent given to the use of the above medications. Will continue to adjust psychiatric and non-psychiatric medications (see above "medication" section and orders section for details) as deemed appropriate & based upon diagnoses and response to treatment.     I will continue to order blood tests/labs and diagnostic tests as deemed  appropriate and review results as they become available (see orders for details and above listed lab/test results).    I will order psychiatric records from previous psych hospitals to further elucidate the nature of patient's psychopathology and review once available.    I will gather additional collateral information from friends, family and o/p treatment team to further elucidate the nature of patient's psychopathology and baselline level of psychiatric functioning.         I certify that this patient's inpatient psychiatric hospital services furnished since the previous certification were, and continue to be, required for treatment that could reasonably be expected to improve the patient's condition, or for diagnostic study, and that the patient continues to need, on a daily basis, active treatment furnished directly by or requiring the supervision of inpatient psychiatric facility personnel. In addition, the hospital records show that services furnished were intensive treatment services, admission or related services, or equivalent services.    EXPECTED DISCHARGE DATE/DAY: Monday     DISPOSITION: Home  Psych FU with in [place] ?       Signed By:   Adela Lank, MD  05/10/2015

## 2015-05-10 NOTE — Behavioral Health Treatment Team (Signed)
Patient had labs drawn this morning.

## 2015-05-10 NOTE — Behavioral Health Treatment Team (Cosign Needed)
GROUP THERAPY PROGRESS NOTE    The patient Corey Crawford a 69 y.o. male is participating in Creative Expression Group.     Group time: 1 hour    Personal goal for participation: To concentrate on selected task    Goal orientation: social    Group therapy participation: active    Therapeutic interventions reviewed and discussed: Crafts, games, music    Impression of participation: The patient was attentive.    BEVERLY S BAKER  05/10/2015  4:42 PM

## 2015-05-10 NOTE — Progress Notes (Signed)
Problem: Altered Thought Process (Adult/Pediatric)  Goal: *STG: Remains safe in hospital  Outcome: Progressing Towards Goal  Patient remains visible on unit, calm, compliant with medication and meals. Patient attends groups, and reads or colors alone. No behavior issue on unit, follows rules, jokes with staff. Continue safety checks, and encourage patient to set goals for discharge.

## 2015-05-11 MED ORDER — LITHIUM CARBONATE 300 MG CAP
300 mg | Freq: Every evening | ORAL | Status: DC
Start: 2015-05-11 — End: 2015-05-13
  Administered 2015-05-12 – 2015-05-13 (×2): via ORAL

## 2015-05-11 MED ORDER — ARIPIPRAZOLE 5 MG TAB
5 mg | Freq: Every day | ORAL | Status: DC
Start: 2015-05-11 — End: 2015-05-14
  Administered 2015-05-11 – 2015-05-14 (×4): via ORAL

## 2015-05-11 MED FILL — SEROQUEL 200 MG TABLET: 200 mg | ORAL | Qty: 1

## 2015-05-11 MED FILL — MAPAP (ACETAMINOPHEN) 325 MG TABLET: 325 mg | ORAL | Qty: 2

## 2015-05-11 MED FILL — AMLODIPINE 5 MG TAB: 5 mg | ORAL | Qty: 2

## 2015-05-11 MED FILL — ABILIFY 5 MG TABLET: 5 mg | ORAL | Qty: 1

## 2015-05-11 MED FILL — LITHIUM CARBONATE 300 MG CAP: 300 mg | ORAL | Qty: 3

## 2015-05-11 MED FILL — ATENOLOL 50 MG TAB: 50 mg | ORAL | Qty: 1

## 2015-05-11 NOTE — Progress Notes (Signed)
Problem: Altered Thought Process (Adult/Pediatric)  Goal: *STG: Remains safe in hospital  Outcome: Progressing Towards Goal  Patient has been visible on the unit during this shift, reading and sitting quietly in the day room. Pt denies any S/H thought and/or A/V hallucination. Pt has been medication and meal compliant. Pt was encouraged to attend all group activities and to discuss any issues or concerns with staff. Staff will also continue to monitor pt with Q -15 checks for safety.

## 2015-05-11 NOTE — Behavioral Health Treatment Team (Signed)
PSYCHIATRIC PROGRESS NOTE         Patient Name  Corey Crawford   Date of Birth 1946/08/12   CSN [id number]   Medical Record Number  [id number]      Age  69 y.o.   PCP Phys Other, MD   Admit date:  05/04/2015    Room Number  309/02  @ [facility]   Date of Service  05/11/2015          PSYCHOTHERAPY SESSION NOTE:  Length of psychotherapy session: 20 minutes    Main condition/diagnosis/issues treated during session today, 05/11/2015 : compliance/ psych medications    I employed  supportive psychotherapy in regards to various ongoing psychosocial stressors, including the following: pre-admission and current problems; housing issues; medical issues; and stress of hospitalization. Interpersonal relationship issues and psychodynamic conflicts explored.  Attempts made to alleviate maladaptive patterns. We, also, worked on issues of denial & effects of substance dependency/use     Overall, patient is slowly progressing    Treatment Plan Update (reviewed an updated 05/11/2015) :  I will modify psychotherapy tx plan by implementing more stress management strategies, building upon cognitive behavioral techniques, increasing coping skills, as well as shoring up psychological defenses).    An extended energy and skill set was needed to engage pt in psychotherapy due to some of the following: resistiveness, complexity, negativity, confrontational nature, hostile behaviors, and/or severe abnormalities in thought processes/psychosis resulting in the loss of expressive/receptive language communication skills.                            E & M PROGRESS NOTE:         HISTORY       CC:  "I am fine"  HISTORY OF PRESENT ILLNESS/INTERVAL HISTORY:  (reviewed/updated 05/11/2015).  In summary, Corey Crawford, is a 69 y.o.  male who presents with a severe exacerbation of the principal diagnosis, Bipolar disorder with psychotic features (HCC), which is improving and not stable. Patient requires  continued inpatient hospitalization for further stabilization, safety monitoring and medication management.  I will continue to coordinate the provision of individual, milieu, occupational, group, and substance abuse therapies to address target symptoms/diagnoses as deemed appropriate for the individual patient.  A coordinated, multidisplinary treatment team round was conducted with the patient (this team consists of the nurse, psychiatric unit pharmcist, Administrator).     per initial evaluation: The patient, Corey Crawford, is a 69 y.o.?? WHITE OR CAUCASIAN male with a past psychiatric history significant for chronic mental illness , who presents at this time for a severe exacerbation of the principle diagnosis of Bipolar disorder with psychotic features (HCC). Patient reports/evidences the following emotional symptoms:?? agitation, mania and psychosis.?? Additional symptomatology include agitation, anger outbursts, difficulty sleeping, increased irritability and problem with medication.?? The above symptoms have been present for . These symptoms are of severe in?? severity per patient's report. The symptoms are constant?? fleeting in nature.?? The patient's condition has been precipitated by and psychosocial stressors( multiple losses,death in family,diagnosed with colon cancer ).?? Patient's condition made worse by treatment noncompliance. UDS negative, BAL=0. ??  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     Corey Crawford presents/reports/evidences the following emotional symptoms today, 05/11/2015:  manic.  The above symptoms have been present for few weeks. These symptoms are of severe severity. The symptoms are constant in nature. Additional symptomatology include delusions, flight of ideas, grandiose, difficulty sleeping, increased irritability and poor concentration.      SIDE EFFECTS: (reviewed/updated 05/11/2015)  None reported or admitted to.  No noted toxicity with use of lithium   ALLERGIES:(reviewed/updated 05/11/2015)  No Known Allergies   MEDICATIONS PRIOR TO ADMISSION:(reviewed/updated 05/11/2015)  Prescriptions prior to admission   Medication Sig   ??? lithium carbonate 600 mg capsule Take 600 mg by mouth nightly. Indications: BIPOLAR DISORDER   ??? atenolol (TENORMIN) 25 mg tablet Take 25 mg by mouth daily. Indications: HYPERTENSION   ??? amLODIPine (NORVASC) 10 mg tablet Take 10 mg by mouth daily. Indications: HYPERTENSION   ??? haloperidol (HALDOL) 2 mg tablet Take 2 mg by mouth nightly. Indications: BIIPOLAR DISORDER   ??? diphenhydrAMINE (BENADRYL) 25 mg capsule Take 25 mg by mouth nightly.      PAST MEDICAL HISTORY: Past medical history from the initial psychiatric evaluation has been reviewed (reviewed/updated 05/11/2015) with no additional updates (I asked patient and no additional past medical history provided). No past medical history on file.No past surgical history on file.   SOCIAL HISTORY: Social history from the initial psychiatric evaluation has been reviewed (reviewed/updated 05/11/2015) with no additional updates (I asked patient and no additional social history provided).   History     Social History   ??? Marital Status: LEGALLY SEPARATED     Spouse Name: N/A   ??? Number of Children: N/A   ??? Years of Education: N/A     Occupational History   ??? Not on file.     Social History Main Topics   ??? Smoking status: Not on file   ??? Smokeless tobacco: Not on file   ??? Alcohol Use: Not on file    ??? Drug Use: Not on file   ??? Sexual Activity: Not on file     Other Topics Concern   ??? Not on file     Social History Narrative   ??? No narrative on file      FAMILY HISTORY: Family history from the initial psychiatric evaluation has been reviewed (reviewed/updated 05/11/2015) with no additional updates (I asked patient and no additional family history provided). No family history on file.    REVIEW OF SYSTEMS: (reviewed/updated 05/11/2015)  Appetite:good   Sleep: good   All other Review of Systems:      General ROS: positive for  - fatigue and sleep disturbance  negative for - suicidal ideation  Respiratory ROS: no cough, shortness of breath, or wheezing  Cardiovascular ROS: no chest pain or dyspnea on exertion         MENTAL STATUS EXAM & VITALS     MENTAL STATUS EXAM (MSE):    MSE FINDINGS ARE WITHIN NORMAL LIMITS (WNL) UNLESS OTHERWISE STATED BELOW. ( ALL OF THE BELOW CATEGORIES OF THE MSE HAVE BEEN REVIEWED (reviewed 05/11/2015) AND UPDATED AS DEEMED APPROPRIATE )  General Presentation age appropriate, casually dressed and older than stated age, unreliable and vague   Orientation oriented to time, place and person   Vital Signs  See below (reviewed 05/11/2015); Vital Signs (BP, Pulse, &  Temp) are within normal limits if not listed below.   Gait and Station Stable/steady, no ataxia   Musculoskeletal System No extrapyramidal symptoms (EPS); no abnormal muscular movements or Tardive Dyskinesia (TD); muscle strength and tone are within normal limits   Language No aphasia or dysarthria   Speech:  normal pitch and normal volume   Thought Processes illogical, wnl rate of thoughts, poor abstract reasoning and computation   Thought Associations Flight of ideas   Thought Content delusions , no hallucinations   Suicidal Ideations none   Homicidal Ideations none   Mood:  irritable   Affect:  Constricted, labile   Memory recent  adequate   Memory remote:  adequate   Concentration/Attention:  impaired   Fund of Knowledge average    Insight:  limited   Reliability poor   Judgment:  limited          VITALS:     Patient Vitals for the past 24 hrs:   Temp Pulse Resp BP   05/11/15 0538 98 ??F (36.7 ??C) (!) 57 18 131/52 mmHg   Wt Readings from Last 3 Encounters:   No data found for Wt     Temp Readings from Last 3 Encounters: 05/11/15 98 ??F (36.7 ??C)      BP Readings from Last 3 Encounters:   05/11/15 131/52     Pulse Readings from Last 3 Encounters:   05/11/15 57            DATA     LABORATORY DATA:(reviewed/updated 05/11/2015)  No results found for this or any previous visit (from the past 24 hour(s)).  No results found for: VALF2, VALAC, VALP, VALPR, DS6, CRBAM, CRBAMP, CARB2, XCRBAM  Lab Results   Component Value Date/Time    LITHIUM 0.62 05/10/2015 05:31 AM      RADIOLOGY REPORTS:(reviewed/updated 05/11/2015)  No results found.       MEDICATIONS     ALL MEDICATIONS:   Current Facility-Administered Medications   Medication Dose Route Frequency   ??? lithium carbonate capsule 1,200 mg  1,200 mg Oral QHS   ??? ARIPiprazole (ABILIFY) tablet 20 mg  20 mg Oral DAILY   ??? atenolol (TENORMIN) tablet 25 mg  25 mg Oral DAILY   ??? amLODIPine (NORVASC) tablet 10 mg  10 mg Oral DAILY   ??? benztropine (COGENTIN) tablet 1 mg  1 mg Oral BID PRN   ??? benztropine (COGENTIN) injection 1 mg  1 mg IntraMUSCular Q12H PRN   ??? LORazepam (ATIVAN) injection 0.5 mg  0.5 mg IntraMUSCular Q4H PRN   ??? LORazepam (ATIVAN) tablet 0.5 mg  0.5 mg Oral Q4H PRN   ??? zolpidem (AMBIEN) tablet 5 mg  5 mg Oral QHS PRN   ??? acetaminophen (TYLENOL) tablet 650 mg  650 mg Oral Q4H PRN   ??? magnesium hydroxide (MILK OF MAGNESIA) oral suspension 30 mL  30 mL Oral DAILY PRN   ??? nicotine (NICODERM CQ) 21 mg/24 hr patch 1 Patch  1 Patch TransDERmal DAILY PRN      SCHEDULED MEDICATIONS:   Current Facility-Administered Medications   Medication Dose Route Frequency   ??? lithium carbonate capsule 1,200 mg  1,200 mg Oral QHS   ??? ARIPiprazole (ABILIFY) tablet 20 mg  20 mg Oral DAILY    ??? atenolol (TENORMIN) tablet 25 mg  25 mg Oral DAILY   ??? amLODIPine (NORVASC) tablet 10 mg  10 mg Oral DAILY          ASSESSMENT & PLAN  DIAGNOSES REQUIRING ACTIVE TREATMENT AND MONITORING: (reviewed/updated 05/11/2015)  Patient Active Hospital Problem List:     Bipolar disorder with psychotic features (HCC) (05/05/2015)    Assessment: still quite psychotic and manic   Plan: continue to adjust psych meds. Get collateral information.I will continue to monitor blood levels ( lithium---a drug with a narrow therapeutic index= NTI) and associated labs for drug therapy implemented that require intense monitoring for toxicity as deemed appropriate base on current medication side effects and pharmacodynamically determined drug 1/2 lives.      Coarse tremors (05/05/2015)    Assessment: stable    Plan: continue to mnoitor     Hypertension (05/05/2015)    Assessment: stable    Plan: monitor. Cont with bp meds            Complete current electronic health record for patient has been reviewed today including consultant notes, ancillary staff notes, nurses and psychiatric tech notes.      The following regarding medications was addressed during rounds with patient:   the risks and benefits of the proposed medication. The patient was given the opportunity to ask questions. Informed consent given to the use of the above medications. Will continue to adjust psychiatric and non-psychiatric medications (see above "medication" section and orders section for details) as deemed appropriate & based upon diagnoses and response to treatment.     I will continue to order blood tests/labs and diagnostic tests as deemed appropriate and review results as they become available (see orders for details and above listed lab/test results).    I will order psychiatric records from previous psych hospitals to further elucidate the nature of patient's psychopathology and review once available.     I will gather additional collateral information from friends, family and o/p treatment team to further elucidate the nature of patient's psychopathology and baselline level of psychiatric functioning.         I certify that this patient's inpatient psychiatric hospital services furnished since the previous certification were, and continue to be, required for treatment that could reasonably be expected to improve the patient's condition, or for diagnostic study, and that the patient continues to need, on a daily basis, active treatment furnished directly by or requiring the supervision of inpatient psychiatric facility personnel. In addition, the hospital records show that services furnished were intensive treatment services, admission or related services, or equivalent services.    EXPECTED DISCHARGE DATE/DAY: TBD     DISPOSITION: Home  Psych FU with in [place] ?       Signed By:   Mackie Pai, MD  05/11/2015

## 2015-05-11 NOTE — Progress Notes (Signed)
Problem: Altered Thought Process (Adult/Pediatric)  Goal: *STG: Remains safe in hospital  Outcome: Progressing Towards Goal  Pt rested quietly in bed with eyes closed.  No signs/symptoms of distress, agitation, or anxiety.  Will monitor for changes.  Q 15 minute checks continue. PT slept 8 hrs

## 2015-05-11 NOTE — Behavioral Health Treatment Team (Cosign Needed)
Patient has been calm and able to follow the direction from staff, less agitated today, sitting in dayroom coloring and interacting with his peers, med and meal compliant, participating in groups throughout the shift, reading magazines and paperback books, continue safety checks q15

## 2015-05-12 LAB — LITHIUM: Lithium level: 0.91 MMOL/L (ref 0.60–1.20)

## 2015-05-12 MED FILL — AMLODIPINE 5 MG TAB: 5 mg | ORAL | Qty: 2

## 2015-05-12 MED FILL — ABILIFY 5 MG TABLET: 5 mg | ORAL | Qty: 1

## 2015-05-12 MED FILL — ATENOLOL 50 MG TAB: 50 mg | ORAL | Qty: 1

## 2015-05-12 MED FILL — LITHIUM CARBONATE 300 MG CAP: 300 mg | ORAL | Qty: 4

## 2015-05-12 NOTE — Behavioral Health Treatment Team (Signed)
Social Work Process Group    Pt did not attend the Social Work Process Group although encouraged twice to do so.     S. Rysinski, LCSW

## 2015-05-12 NOTE — Behavioral Health Treatment Team (Signed)
PSYCHIATRIC PROGRESS NOTE         Patient Name  Corey Crawford   Date of Birth 1946-03-11   CSN [id number]   Medical Record Number  [id number]      Age  69 y.o.   PCP Phys Other, MD   Admit date:  05/04/2015    Room Number  309/02  @ [facility]   Date of Service  05/12/2015          PSYCHOTHERAPY SESSION NOTE:  Length of psychotherapy session: 20 minutes    Main condition/diagnosis/issues treated during session today, 05/12/2015 :  paranoia, denial    I employed  supportive psychotherapy in regards to various ongoing psychosocial stressors, including the following: pre-admission and current problems; housing issues; medical issues; and stress of hospitalization. Interpersonal relationship issues and psychodynamic conflicts explored.  Attempts made to alleviate maladaptive patterns. We, also, worked on issues of denial & effects of substance dependency/use     Overall, patient is slowly/minimally progressing    Treatment Plan Update (reviewed an updated 05/12/2015) :  I will modify psychotherapy tx plan by implementing more stress management strategies, building upon cognitive behavioral techniques, increasing coping skills, as well as shoring up psychological defenses).    An extended energy and skill set was needed to engage pt in psychotherapy due to some of the following: resistiveness, complexity, negativity, confrontational nature, hostile behaviors, and/or severe abnormalities in thought processes/psychosis resulting in the loss of expressive/receptive language communication skills.                            E & M PROGRESS NOTE:         HISTORY       CC:  "I am fine, i want to go home tomorrow"  HISTORY OF PRESENT ILLNESS/INTERVAL HISTORY:  (reviewed/updated 05/12/2015).  In summary, Corey Crawford, is a 69 y.o.  male who presents with a severe exacerbation of the principal diagnosis, Bipolar disorder with psychotic features (HCC), which is improving and not stable. Patient requires  continued inpatient hospitalization for further stabilization, safety monitoring and medication management.  I will continue to coordinate the provision of individual, milieu, occupational, group, and substance abuse therapies to address target symptoms/diagnoses as deemed appropriate for the individual patient.  A coordinated, multidisplinary treatment team round was conducted with the patient (this team consists of the nurse, psychiatric unit pharmcist, Administrator).     per initial evaluation: The patient, Corey Crawford, is a 69 y.o.?? WHITE OR CAUCASIAN male with a past psychiatric history significant for chronic mental illness , who presents at this time for a severe exacerbation of the principle diagnosis of Bipolar disorder with psychotic features (HCC). Patient reports/evidences the following emotional symptoms:?? agitation, mania and psychosis.?? Additional symptomatology include agitation, anger outbursts, difficulty sleeping, increased irritability and problem with medication.?? The above symptoms have been present for . These symptoms are of severe in?? severity per patient's report. The symptoms are constant?? fleeting in nature.?? The patient's condition has been precipitated by and psychosocial stressors( multiple losses,death in family,diagnosed with colon cancer ).?? Patient's condition made worse by treatment noncompliance. UDS negative, BAL=0. ??  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     Corey Crawford presents/reports/evidences the following emotional symptoms today, 05/12/2015:  manic.  The above symptoms have been present for few weeks. These symptoms are of severe severity. The symptoms are constant in nature. Additional symptomatology include delusions, flight of ideas, grandiose, difficulty sleeping, and irritability.      SIDE EFFECTS: (reviewed/updated 05/12/2015)  None reported or admitted to.  No noted toxicity with use of lithium   ALLERGIES:(reviewed/updated 05/12/2015)  No Known Allergies   MEDICATIONS PRIOR TO ADMISSION:(reviewed/updated 05/12/2015)  Prescriptions prior to admission   Medication Sig   ??? lithium carbonate 600 mg capsule Take 600 mg by mouth nightly. Indications: BIPOLAR DISORDER   ??? atenolol (TENORMIN) 25 mg tablet Take 25 mg by mouth daily. Indications: HYPERTENSION   ??? amLODIPine (NORVASC) 10 mg tablet Take 10 mg by mouth daily. Indications: HYPERTENSION   ??? haloperidol (HALDOL) 2 mg tablet Take 2 mg by mouth nightly. Indications: BIIPOLAR DISORDER   ??? diphenhydrAMINE (BENADRYL) 25 mg capsule Take 25 mg by mouth nightly.      PAST MEDICAL HISTORY: Past medical history from the initial psychiatric evaluation has been reviewed (reviewed/updated 05/12/2015) with no additional updates (I asked patient and no additional past medical history provided). No past medical history on file.No past surgical history on file.   SOCIAL HISTORY: Social history from the initial psychiatric evaluation has been reviewed (reviewed/updated 05/12/2015) with no additional updates (I asked patient and no additional social history provided).   History     Social History   ??? Marital Status: LEGALLY SEPARATED     Spouse Name: N/A   ??? Number of Children: N/A   ??? Years of Education: N/A     Occupational History   ??? Not on file.     Social History Main Topics   ??? Smoking status: Not on file   ??? Smokeless tobacco: Not on file   ??? Alcohol Use: Not on file   ??? Drug Use: Not on file    ??? Sexual Activity: Not on file     Other Topics Concern   ??? Not on file     Social History Narrative   ??? No narrative on file      FAMILY HISTORY: Family history from the initial psychiatric evaluation has been reviewed (reviewed/updated 05/12/2015) with no additional updates (I asked patient and no additional family history provided). No family history on file.    REVIEW OF SYSTEMS: (reviewed/updated 05/12/2015)  Appetite:good   Sleep: good   All other Review of Systems:   negative for - suicidal ideation  Respiratory ROS: no cough, shortness of breath, or wheezing  Cardiovascular ROS: no chest pain or dyspnea on exertion         MENTAL STATUS EXAM & VITALS     MENTAL STATUS EXAM (MSE):    MSE FINDINGS ARE WITHIN NORMAL LIMITS (WNL) UNLESS OTHERWISE STATED BELOW. ( ALL OF THE BELOW CATEGORIES OF THE MSE HAVE BEEN REVIEWED (reviewed 05/12/2015) AND UPDATED AS DEEMED APPROPRIATE )  General Presentation age appropriate, casually dressed and older than stated age, unreliable and vague   Orientation oriented to time, place and person   Vital Signs  See below (reviewed 05/12/2015); Vital Signs (BP, Pulse, & Temp) are within normal limits if not listed below.  Gait and Station Stable/steady, no ataxia   Musculoskeletal System No extrapyramidal symptoms (EPS); no abnormal muscular movements or Tardive Dyskinesia (TD); muscle strength and tone are within normal limits   Language No aphasia or dysarthria   Speech:  soft   Thought Processes illogical, wnl rate of thoughts, poor abstract reasoning and computation   Thought Associations Flight of ideas   Thought Content delusions , no hallucinations   Suicidal Ideations none   Homicidal Ideations none   Mood:  Irritable, labile   Affect:  Constricted, irritable, labile   Memory recent  adequate   Memory remote:  adequate   Concentration/Attention:  impaired   Fund of Knowledge average   Insight:  limited   Reliability poor   Judgment:  limited          VITALS:      Patient Vitals for the past 24 hrs:   Temp Pulse Resp BP   05/12/15 0831 - 70 - -   05/12/15 0547 98.1 ??F (36.7 ??C) (!) 55 16 119/58 mmHg   05/11/15 1235 98.6 ??F (37 ??C) (!) 55 18 131/66 mmHg     Wt Readings from Last 3 Encounters:   No data found for Wt     Temp Readings from Last 3 Encounters:   05/12/15 98.1 ??F (36.7 ??C)      BP Readings from Last 3 Encounters:   05/12/15 119/58     Pulse Readings from Last 3 Encounters:   05/12/15 70            DATA     LABORATORY DATA:(reviewed/updated 05/12/2015)  Recent Results (from the past 24 hour(s))   LITHIUM    Collection Time: 05/12/15  5:53 AM   Result Value Ref Range    Lithium 0.91 0.60 - 1.20 MMOL/L    Reported dose date: UNKNOWN      Reported dose time: UNKNOWN      Reported dose: UNKNOWN UNITS     No results found for: VALF2, VALAC, VALP, VALPR, DS6, CRBAM, CRBAMP, CARB2, XCRBAM  Lab Results   Component Value Date/Time    LITHIUM 0.91 05/12/2015 05:53 AM      RADIOLOGY REPORTS:(reviewed/updated 05/12/2015)  No results found.       MEDICATIONS     ALL MEDICATIONS:   Current Facility-Administered Medications   Medication Dose Route Frequency   ??? lithium carbonate capsule 1,200 mg  1,200 mg Oral QHS   ??? ARIPiprazole (ABILIFY) tablet 20 mg  20 mg Oral DAILY   ??? atenolol (TENORMIN) tablet 25 mg  25 mg Oral DAILY   ??? amLODIPine (NORVASC) tablet 10 mg  10 mg Oral DAILY   ??? benztropine (COGENTIN) tablet 1 mg  1 mg Oral BID PRN   ??? benztropine (COGENTIN) injection 1 mg  1 mg IntraMUSCular Q12H PRN   ??? LORazepam (ATIVAN) injection 0.5 mg  0.5 mg IntraMUSCular Q4H PRN   ??? LORazepam (ATIVAN) tablet 0.5 mg  0.5 mg Oral Q4H PRN   ??? zolpidem (AMBIEN) tablet 5 mg  5 mg Oral QHS PRN   ??? acetaminophen (TYLENOL) tablet 650 mg  650 mg Oral Q4H PRN   ??? magnesium hydroxide (MILK OF MAGNESIA) oral suspension 30 mL  30 mL Oral DAILY PRN   ??? nicotine (NICODERM CQ) 21 mg/24 hr patch 1 Patch  1 Patch TransDERmal DAILY PRN      SCHEDULED MEDICATIONS:    Current Facility-Administered Medications   Medication Dose Route Frequency   ??? lithium carbonate  capsule 1,200 mg  1,200 mg Oral QHS   ??? ARIPiprazole (ABILIFY) tablet 20 mg  20 mg Oral DAILY   ??? atenolol (TENORMIN) tablet 25 mg  25 mg Oral DAILY   ??? amLODIPine (NORVASC) tablet 10 mg  10 mg Oral DAILY          ASSESSMENT & PLAN     DIAGNOSES REQUIRING ACTIVE TREATMENT AND MONITORING: (reviewed/updated 05/12/2015)  Patient Active Hospital Problem List:     Bipolar disorder with psychotic features (HCC) (05/05/2015)    Assessment: still quite psychotic and manic, no sig change of recent.    Plan: continue to adjust psych meds. Get collateral information to verify his baseline.  I will continue to monitor blood levels ( lithium---a drug with a narrow therapeutic index= NTI) and associated labs for drug therapy implemented that require intense monitoring for toxicity as deemed appropriate base on current medication side effects and pharmacodynamically determined drug 1/2 lives. Li level toay =0.91, wnl.      Coarse tremors (05/05/2015)    Assessment: stable, improved    Plan: continue to mnoitor     Hypertension (05/05/2015)    Assessment: stable    Plan: monitor. Cont with bp meds            Complete current electronic health record for patient has been reviewed today including consultant notes, ancillary staff notes, nurses and psychiatric tech notes.      The following regarding medications was addressed during rounds with patient:   the risks and benefits of the proposed medication. The patient was given the opportunity to ask questions. Informed consent given to the use of the above medications. Will continue to adjust psychiatric and non-psychiatric medications (see above "medication" section and orders section for details) as deemed appropriate & based upon diagnoses and response to treatment.     I will continue to order blood tests/labs and diagnostic tests as deemed  appropriate and review results as they become available (see orders for details and above listed lab/test results).    I will order psychiatric records from previous psych hospitals to further elucidate the nature of patient's psychopathology and review once available.    I will gather additional collateral information from friends, family and o/p treatment team to further elucidate the nature of patient's psychopathology and baselline level of psychiatric functioning.         I certify that this patient's inpatient psychiatric hospital services furnished since the previous certification were, and continue to be, required for treatment that could reasonably be expected to improve the patient's condition, or for diagnostic study, and that the patient continues to need, on a daily basis, active treatment furnished directly by or requiring the supervision of inpatient psychiatric facility personnel. In addition, the hospital records show that services furnished were intensive treatment services, admission or related services, or equivalent services.    EXPECTED DISCHARGE DATE/DAY: TBD     DISPOSITION: Home  Psych FU with in [place] ?       Signed By:   Mackie Pai, MD  05/12/2015

## 2015-05-12 NOTE — Progress Notes (Signed)
Problem: Altered Thought Process (Adult/Pediatric)  Goal: *STG: Demonstrates ability to understand and use improved judgment in daily activities and relationships  Outcome: Progressing Towards Goal  Patient is visible on the unit. Calm and cooperative. Medication and meal complaint. Patient denies psychosis. Attending groups. Staff will continue to monitor patient q63m safety checks.

## 2015-05-12 NOTE — Progress Notes (Signed)
Problem: Altered Thought Process (Adult/Pediatric)  Goal: *STG: Remains safe in hospital  Outcome: Progressing Towards Goal  Pt resting quietly in bed with eyes closed,  no sign of any distress at this time. Pt will continue to be monitored Q 15 minute for safety and needs. Pt slept about 7 hours.  Labs drawn this morning.

## 2015-05-12 NOTE — Progress Notes (Signed)
GROUP THERAPY PROGRESS NOTE    Corey Crawford is participating in Coping Skills Group.     Group time: 30 minutes    Goal orientation: social    Group therapy participation: active    Therapeutic interventions reviewed and discussed:

## 2015-05-13 MED ORDER — LITHIUM CARBONATE 300 MG CAP
300 mg | Freq: Every evening | ORAL | Status: DC
Start: 2015-05-13 — End: 2015-05-31
  Administered 2015-05-14 – 2015-05-31 (×18): via ORAL

## 2015-05-13 MED ORDER — DIVALPROEX 500 MG 24 HR TAB
500 mg | Freq: Every evening | ORAL | Status: DC
Start: 2015-05-13 — End: 2015-05-17
  Administered 2015-05-14 – 2015-05-17 (×4): via ORAL

## 2015-05-13 MED ORDER — CLONAZEPAM 0.5 MG TAB
0.5 mg | Freq: Two times a day (BID) | ORAL | Status: DC
Start: 2015-05-13 — End: 2015-05-14
  Administered 2015-05-13 – 2015-05-14 (×3): via ORAL

## 2015-05-13 MED FILL — AMLODIPINE 5 MG TAB: 5 mg | ORAL | Qty: 2

## 2015-05-13 MED FILL — LITHIUM CARBONATE 300 MG CAP: 300 mg | ORAL | Qty: 4

## 2015-05-13 MED FILL — CLONAZEPAM 0.5 MG TAB: 0.5 mg | ORAL | Qty: 1

## 2015-05-13 MED FILL — ABILIFY 5 MG TABLET: 5 mg | ORAL | Qty: 1

## 2015-05-13 MED FILL — ATENOLOL 50 MG TAB: 50 mg | ORAL | Qty: 1

## 2015-05-13 NOTE — Behavioral Health Treatment Team (Signed)
GROUP THERAPY PROGRESS NOTE    Corey Crawford is participating in Process Group.     Group time: 35 minutes, from 535 to 610 pm    Personal goal for participation: identify one thing to go differently upon discharge    Goal orientation: personal    Group therapy participation: disorganized    Therapeutic interventions reviewed and discussed: discuss what patient can do differently upon discharge.    Impression of participation: disorganized

## 2015-05-13 NOTE — Behavioral Health Treatment Team (Signed)
PSYCHIATRIC PROGRESS NOTE         Patient Name  Corey Crawford   Date of Birth 12/07/1945   CSN [id number]   Medical Record Number  [id number]      Age  69 y.o.   PCP Phys Other, MD   Admit date:  05/04/2015    Room Number  324/01  @ [facility]   Date of Service  05/13/2015          PSYCHOTHERAPY SESSION NOTE:  Length of psychotherapy session: 20 minutes    Main condition/diagnosis/issues treated during session today, 05/13/2015 :  paranoia, denial, mania    I employed  supportive psychotherapy in regards to various ongoing psychosocial stressors, including the following: pre-admission and current problems; housing issues; medical issues; and stress of hospitalization. Interpersonal relationship issues and psychodynamic conflicts explored.  Attempts made to alleviate maladaptive patterns. We, also, worked on issues of denial & effects of substance dependency/use     Overall, patient is minimally progressing    Treatment Plan Update (reviewed an updated 05/13/2015) :  I will modify psychotherapy tx plan by implementing more stress management strategies, building upon cognitive behavioral techniques, increasing coping skills, as well as shoring up psychological defenses).    An extended energy and skill set was needed to engage pt in psychotherapy due to some of the following: resistiveness, complexity, negativity, confrontational nature, hostile behaviors, and/or severe abnormalities in thought processes/psychosis resulting in the loss of expressive/receptive language communication skills.                            E & M PROGRESS NOTE:         HISTORY       CC:  "I cant stay here, i want to go home"  HISTORY OF PRESENT ILLNESS/INTERVAL HISTORY:  (reviewed/updated 05/13/2015).  In summary, Corey Crawford, is a 69 y.o.  male who presents with a severe exacerbation of the principal diagnosis, Bipolar disorder with psychotic features (HCC), which is improving and not stable. Patient requires  continued inpatient hospitalization for further stabilization, safety monitoring and medication management.  I will continue to coordinate the provision of individual, milieu, occupational, group, and substance abuse therapies to address target symptoms/diagnoses as deemed appropriate for the individual patient.  A coordinated, multidisplinary treatment team round was conducted with the patient (this team consists of the nurse, psychiatric unit pharmcist, Administrator).     per initial evaluation: The patient, Corey Crawford, is a 69 y.o.?? WHITE OR CAUCASIAN male with a past psychiatric history significant for chronic mental illness , who presents at this time for a severe exacerbation of the principle diagnosis of Bipolar disorder with psychotic features (HCC). Patient reports/evidences the following emotional symptoms:?? agitation, mania and psychosis.?? Additional symptomatology include agitation, anger outbursts, difficulty sleeping, increased irritability and problem with medication.?? The above symptoms have been present for . These symptoms are of severe in?? severity per patient's report. The symptoms are constant?? fleeting in nature.?? The patient's condition has been precipitated by and psychosocial stressors( multiple losses,death in family,diagnosed with colon cancer ).?? Patient's condition made worse by treatment noncompliance. UDS negative, BAL=0. ??  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     Corey Crawford presents/reports/evidences the following emotional symptoms today, 05/13/2015:  manic.  The above symptoms have been present for few weeks. These symptoms are of severe severity. The symptoms are constant in nature. Additional symptomatology include delusions, disorganized thought processes/flight of ideas, grandiose, difficulty sleeping, and irritability.      SIDE EFFECTS: (reviewed/updated 05/13/2015)  None reported or admitted to.  No noted toxicity with use of lithium   ALLERGIES:(reviewed/updated 05/13/2015)  No Known Allergies   MEDICATIONS PRIOR TO ADMISSION:(reviewed/updated 05/13/2015)  Prescriptions prior to admission   Medication Sig   ??? lithium carbonate 600 mg capsule Take 600 mg by mouth nightly. Indications: BIPOLAR DISORDER   ??? atenolol (TENORMIN) 25 mg tablet Take 25 mg by mouth daily. Indications: HYPERTENSION   ??? amLODIPine (NORVASC) 10 mg tablet Take 10 mg by mouth daily. Indications: HYPERTENSION   ??? haloperidol (HALDOL) 2 mg tablet Take 2 mg by mouth nightly. Indications: BIIPOLAR DISORDER   ??? diphenhydrAMINE (BENADRYL) 25 mg capsule Take 25 mg by mouth nightly.      PAST MEDICAL HISTORY: Past medical history from the initial psychiatric evaluation has been reviewed (reviewed/updated 05/13/2015) with no additional updates (I asked patient and no additional past medical history provided). No past medical history on file.No past surgical history on file.   SOCIAL HISTORY: Social history from the initial psychiatric evaluation has been reviewed (reviewed/updated 05/13/2015) with no additional updates (I asked patient and no additional social history provided).   History     Social History   ??? Marital Status: LEGALLY SEPARATED     Spouse Name: N/A   ??? Number of Children: N/A   ??? Years of Education: N/A     Occupational History   ??? Not on file.     Social History Main Topics   ??? Smoking status: Not on file   ??? Smokeless tobacco: Not on file   ??? Alcohol Use: Not on file    ??? Drug Use: Not on file   ??? Sexual Activity: Not on file     Other Topics Concern   ??? Not on file     Social History Narrative   ??? No narrative on file      FAMILY HISTORY: Family history from the initial psychiatric evaluation has been reviewed (reviewed/updated 05/13/2015) with no additional updates (I asked patient and no additional family history provided). No family history on file.    REVIEW OF SYSTEMS: (reviewed/updated 05/13/2015)  Appetite:good   Sleep: good   All other Review of Systems:   negative for - suicidal ideation  Respiratory ROS: no cough, shortness of breath, or wheezing  Cardiovascular ROS: no chest pain or dyspnea on exertion         MENTAL STATUS EXAM & VITALS     MENTAL STATUS EXAM (MSE):    MSE FINDINGS ARE WITHIN NORMAL LIMITS (WNL) UNLESS OTHERWISE STATED BELOW. ( ALL OF THE BELOW CATEGORIES OF THE MSE HAVE BEEN REVIEWED (reviewed 05/13/2015) AND UPDATED AS DEEMED APPROPRIATE )  General Presentation age appropriate, casually dressed and older than stated age, unreliable and vague, very poor eye contact   Orientation oriented to time, place and person   Vital Signs  See below (reviewed 05/13/2015); Vital Signs (BP, Pulse, & Temp) are within normal  limits if not listed below.   Gait and Station Stable/steady, no ataxia   Musculoskeletal System No extrapyramidal symptoms (EPS); no abnormal muscular movements or Tardive Dyskinesia (TD); muscle strength and tone are within normal limits   Language No aphasia or dysarthria   Speech:  soft   Thought Processes illogical, wnl rate of thoughts, poor abstract reasoning and computation   Thought Associations Flight of ideas   Thought Content delusions , no hallucinations   Suicidal Ideations none   Homicidal Ideations none   Mood:  Irritable, labile   Affect:  Constricted, irritable, labile   Memory recent  adequate   Memory remote:  adequate   Concentration/Attention:  impaired   Fund of Knowledge average   Insight:  limited   Reliability poor    Judgment:  limited          VITALS:     Patient Vitals for the past 24 hrs:   Temp Pulse Resp BP   05/13/15 0615 98.1 ??F (36.7 ??C) (!) 49 16 134/59 mmHg     Wt Readings from Last 3 Encounters:   05/13/15 95.255 kg (210 lb)     Temp Readings from Last 3 Encounters:   05/13/15 98.1 ??F (36.7 ??C)      BP Readings from Last 3 Encounters:   05/13/15 134/59     Pulse Readings from Last 3 Encounters:   05/13/15 49            DATA     LABORATORY DATA:(reviewed/updated 05/13/2015)  No results found for this or any previous visit (from the past 24 hour(s)).  No results found for: VALF2, VALAC, VALP, VALPR, DS6, CRBAM, CRBAMP, CARB2, XCRBAM  Lab Results   Component Value Date/Time    LITHIUM 0.91 05/12/2015 05:53 AM      RADIOLOGY REPORTS:(reviewed/updated 05/13/2015)  No results found.       MEDICATIONS     ALL MEDICATIONS:   Current Facility-Administered Medications   Medication Dose Route Frequency   ??? lithium carbonate capsule 900 mg  900 mg Oral QHS   ??? divalproex ER (DEPAKOTE ER) 24 hour tablet 1,750 mg  1,750 mg Oral QHS   ??? clonazePAM (KlonoPIN) tablet 0.25 mg  0.25 mg Oral BID   ??? ARIPiprazole (ABILIFY) tablet 20 mg  20 mg Oral DAILY   ??? atenolol (TENORMIN) tablet 25 mg  25 mg Oral DAILY   ??? amLODIPine (NORVASC) tablet 10 mg  10 mg Oral DAILY   ??? benztropine (COGENTIN) tablet 1 mg  1 mg Oral BID PRN   ??? benztropine (COGENTIN) injection 1 mg  1 mg IntraMUSCular Q12H PRN   ??? LORazepam (ATIVAN) injection 0.5 mg  0.5 mg IntraMUSCular Q4H PRN ??? LORazepam (ATIVAN) tablet 0.5 mg  0.5 mg Oral Q4H PRN   ??? zolpidem (AMBIEN) tablet 5 mg  5 mg Oral QHS PRN   ??? acetaminophen (TYLENOL) tablet 650 mg  650 mg Oral Q4H PRN   ??? magnesium hydroxide (MILK OF MAGNESIA) oral suspension 30 mL  30 mL Oral DAILY PRN   ??? nicotine (NICODERM CQ) 21 mg/24 hr patch 1 Patch  1 Patch TransDERmal DAILY PRN      SCHEDULED MEDICATIONS:   Current Facility-Administered Medications   Medication Dose Route Frequency    ??? lithium carbonate capsule 900 mg  900 mg Oral QHS   ??? divalproex ER (DEPAKOTE ER) 24 hour tablet 1,750 mg  1,750 mg Oral QHS   ??? clonazePAM (KlonoPIN) tablet 0.25 mg  0.25 mg Oral BID   ??? ARIPiprazole (ABILIFY) tablet 20 mg  20 mg Oral DAILY   ??? atenolol (TENORMIN) tablet 25 mg  25 mg Oral DAILY   ??? amLODIPine (NORVASC) tablet 10 mg  10 mg Oral DAILY          ASSESSMENT & PLAN     DIAGNOSES REQUIRING ACTIVE TREATMENT AND MONITORING: (reviewed/updated 05/13/2015)  Patient Active Hospital Problem List:     Bipolar disorder with psychotic features (HCC) (05/05/2015)    Assessment: still quite psychotic and manic, no sig change of recent.    Plan: I will continue to adjust psych meds. Will get collateral information to verify his baseline.  I will continue to monitor blood levels ( lithium---a drug with a narrow therapeutic index= NTI) and associated labs for drug therapy implemented that require intense monitoring for toxicity as deemed appropriate base on current medication side effects and pharmacodynamically determined drug 1/2 lives. Last  Li level =0.91, not quite at steady state though. Given very limited response to tx thus far, will augment with depakote.       Coarse tremors (05/05/2015)    Assessment: stable, improved    Plan: continue to mnoitor     Hypertension (05/05/2015)    Assessment: stable    Plan: monitor. Cont with bp meds            Complete current electronic health record for patient has been reviewed today including consultant notes, ancillary staff notes, nurses and psychiatric tech notes.      The following regarding medications was addressed during rounds with patient:   the risks and benefits of the proposed medication. The patient was given the opportunity to ask questions. Informed consent given to the use of the above medications. Will continue to adjust psychiatric and non-psychiatric medications (see above "medication" section and orders section for  details) as deemed appropriate & based upon diagnoses and response to treatment.     I will continue to order blood tests/labs and diagnostic tests as deemed appropriate and review results as they become available (see orders for details and above listed lab/test results).    I will order psychiatric records from previous psych hospitals to further elucidate the nature of patient's psychopathology and review once available.    I will gather additional collateral information from friends, family and o/p treatment team to further elucidate the nature of patient's psychopathology and baselline level of psychiatric functioning.         I certify that this patient's inpatient psychiatric hospital services furnished since the previous certification were, and continue to be, required for treatment that could reasonably be expected to improve the patient's condition, or for diagnostic study, and that the patient continues to need, on a daily basis, active treatment furnished directly by or requiring the supervision of inpatient psychiatric facility personnel. In addition, the hospital records show that services furnished were intensive treatment services, admission or related services, or equivalent services.    EXPECTED DISCHARGE DATE/DAY: TBD     DISPOSITION: Home  Psych FU with in [place] ?       Signed By:   Mackie Pai, MD  05/13/2015

## 2015-05-13 NOTE — Progress Notes (Signed)
Problem: Altered Thought Process (Adult/Pediatric)  Goal: *STG: Attends activities and groups  Outcome: Progressing Towards Goal  Pt is alert.  He is compliant with mediations and meals.  He denies pain.  He is able to approach staff with wants and needs.  Pt is visible on the unit.  He is participating in groups and interacting with staff.  Pt denies SI/HI.  He also denies A/V hallucinations. Will continue to monitor pt q15 minutes for safety and support.

## 2015-05-13 NOTE — Behavioral Health Treatment Team (Signed)
Patient upset over medication changes,instructed patient to talk with the doctor tomorrow about his medicines. Patient has been on Depakote a long time ago, refused any educational material on his new medications.

## 2015-05-13 NOTE — Behavioral Health Treatment Team (Cosign Needed)
The patient Corey Crawford is participating in Relaxation Group.    Group time: 30 minutes    Personal goal for participation: personal  Goal orientation: To learn to relax    Group therapy participation: Active    Therapeutic interventions reviewed and discussed: Yes    ETHEL BARNETT-JOHNSON  05/13/2015

## 2015-05-13 NOTE — Progress Notes (Signed)
Problem: Altered Thought Process (Adult/Pediatric)  Goal: *STG: Remains safe in hospital  Outcome: Progressing Towards Goal  Patient isolates in room, became irritable when trying to give scheduled medication at 5 pm. Will take at 2100 with other medication. Patient sitting in dayroom at beginning of shift coloring. Watching TV, little verbal interaction with peers or staff. Med and meal compliant, denies A/V hallucinations. Denies S/H ideation. Continue safety checks.

## 2015-05-13 NOTE — Behavioral Health Treatment Team (Signed)
.  TRANSFER - OUT REPORT:    Verbal report given to [place] RN(name) on [employer]  being transferred to general bh(unit) for routine progression of care       Report consisted of patient???s Situation, Background, Assessment and   Recommendations(SBAR).     Information from the following report(s) SBAR was reviewed with the receiving nurse.    Lines:       Opportunity for questions and clarification was provided.      Patient transported with:   Registered Nurse

## 2015-05-13 NOTE — Progress Notes (Signed)
Laboratory monitoring for mood stabilizer and antipsychotics:    Lithium = 0.91 mmol/L @ 0553 taken after one dose of $Remov'1200mg'HCXBpv$  and steady state at $Remove'900mg'ZYtqmqT$ . Dose of lithium was decreased back to $Remov'900mg'ucsJik$  and Depakote $RemoveBefo'1750mg'KusoCUyFoEg$  (18.3 mg/kg) was initiated and levels will be taken at steady state.       The patient is currently taking the following medication(s):   Current Facility-Administered Medications   Medication Dose Route Frequency   ??? lithium carbonate capsule 900 mg  900 mg Oral QHS   ??? divalproex ER (DEPAKOTE ER) 24 hour tablet 1,750 mg  1,750 mg Oral QHS   ??? clonazePAM (KlonoPIN) tablet 0.25 mg  0.25 mg Oral BID   ??? ARIPiprazole (ABILIFY) tablet 20 mg  20 mg Oral DAILY   ??? atenolol (TENORMIN) tablet 25 mg  25 mg Oral DAILY   ??? amLODIPine (NORVASC) tablet 10 mg  10 mg Oral DAILY       Serum Drug Level(s)    LITHIUM  Recent Labs      05/12/15   0553   LI  0.91       V    Renal Function, Hepatic Function and Chemistry  Estimated Creatinine Clearance: 115 mL/min (based on Cr of 0.7).    Lab Results   Component Value Date/Time    SODIUM 141 05/05/2015 06:00 AM    POTASSIUM 3.5 05/05/2015 06:00 AM    CHLORIDE 106 05/05/2015 06:00 AM    CO2 27 05/05/2015 06:00 AM    ANION GAP 8 05/05/2015 06:00 AM    GLUCOSE 130 05/05/2015 06:00 AM    BUN 8 05/05/2015 06:00 AM    CREATININE 0.70 05/05/2015 06:00 AM    BUN/CREATININE RATIO 11 05/05/2015 06:00 AM    GFR EST AA >60 05/05/2015 06:00 AM    GFR EST NON-AA >60 05/05/2015 06:00 AM    CALCIUM 8.3 05/05/2015 06:00 AM    ALT 44 05/05/2015 06:00 AM    AST 32 05/05/2015 06:00 AM    ALK. PHOSPHATASE 78 05/05/2015 06:00 AM    PROTEIN, TOTAL 6.6 05/05/2015 06:00 AM    ALBUMIN 3.2 05/05/2015 06:00 AM    GLOBULIN 3.4 05/05/2015 06:00 AM    A-G RATIO 0.9 05/05/2015 06:00 AM    BILIRUBIN, TOTAL 0.5 05/05/2015 06:00 AM       Lab Results   Component Value Date/Time    GLUCOSE 130 05/05/2015 06:00 AM       No results found for: HBA1C, HGBE8, HBA1CEXT    Hematology   No results found for: WBC, HGBPOC, HGB, HGBP, HCTPOC, HCT, PHCT, RBCH, PLT, MCV, HGBEXT, HCTEXT, PLTEXT    Lipids  Lab Results   Component Value Date/Time    CHOLESTEROL, TOTAL 157 05/05/2015 06:00 AM    HDL CHOLESTEROL 41 05/05/2015 06:00 AM    LDL, CALCULATED 102.8 05/05/2015 06:00 AM    TRIGLYCERIDE 66 05/05/2015 06:00 AM    CHOL/HDL RATIO 3.8 05/05/2015 06:00 AM       Thyroid Function    Lab Results   Component Value Date/Time    TSH 0.48 05/05/2015 06:00 AM       Vitals  Visit Vitals   Item Reading   ??? BP 134/59 mmHg   ??? Pulse 49   ??? Temp 98.1 ??F (36.7 ??C)   ??? Resp 16   ??? Ht 175.3 cm (69")   ??? Wt 95.255 kg (210 lb)   ??? BMI 31.00 kg/m2   ??? SpO2 98%  Donell Beers, PharmD, BCPS  [phone] (pharmacy)

## 2015-05-13 NOTE — Behavioral Health Treatment Team (Cosign Needed)
GROUP THERAPY PROGRESS NOTE    The patient Corey Crawford is participating in Reflection Group.    Group time: 30 minutes    Personal goal for participation: To reflect on today's occurences    Goal orientation: Reflection    Group therapy participation: Active    Therapeutic interventions reviewed and discussed: Yes    ETHEL BARNETT-JOHNSON  05/13/2015

## 2015-05-13 NOTE — Progress Notes (Signed)
Problem: Altered Thought Process (Adult/Pediatric)  Goal: *STG: Remains safe in hospital  Outcome: Progressing Towards Goal  Patient has been resting in their room with their eyes closed and has showed no signs of distress through out the shift. Patient slept for 7 hours. Patient is on every 15 minute checks for safety.

## 2015-05-14 MED ORDER — ARIPIPRAZOLE 15 MG TAB
15 mg | Freq: Every day | ORAL | Status: DC
Start: 2015-05-14 — End: 2015-05-23
  Administered 2015-05-15 – 2015-05-23 (×9): via ORAL

## 2015-05-14 MED ORDER — CLONAZEPAM 0.5 MG TAB
0.5 mg | Freq: Two times a day (BID) | ORAL | Status: DC
Start: 2015-05-14 — End: 2015-05-17
  Administered 2015-05-15 – 2015-05-17 (×6): via ORAL

## 2015-05-14 MED FILL — DEPAKOTE ER 500 MG TABLET,EXTENDED RELEASE: 500 mg | ORAL | Qty: 3

## 2015-05-14 MED FILL — MAPAP (ACETAMINOPHEN) 325 MG TABLET: 325 mg | ORAL | Qty: 2

## 2015-05-14 MED FILL — CLONAZEPAM 0.5 MG TAB: 0.5 mg | ORAL | Qty: 1

## 2015-05-14 MED FILL — AMLODIPINE 5 MG TAB: 5 mg | ORAL | Qty: 2

## 2015-05-14 MED FILL — ABILIFY 15 MG TABLET: 15 mg | ORAL | Qty: 1

## 2015-05-14 MED FILL — ATENOLOL 25 MG TAB: 25 mg | ORAL | Qty: 1

## 2015-05-14 MED FILL — ZOLPIDEM 5 MG TAB: 5 mg | ORAL | Qty: 1

## 2015-05-14 MED FILL — LITHIUM CARBONATE 300 MG CAP: 300 mg | ORAL | Qty: 3

## 2015-05-14 NOTE — Behavioral Health Treatment Team (Cosign Needed)
GROUP THERAPY PROGRESS NOTE    The patient Corey Crawford a 69 y.o. male is participating in Creative Expression Group.     Group time: 1 hour    Personal goal for participation: To concentrate on selected task    Goal orientation: social    Group therapy participation: active    Therapeutic interventions reviewed and discussed: Crafts, games, music    Impression of participation: The patient was attentive.    BEVERLY S BAKER  05/14/2015  5:01 PM

## 2015-05-14 NOTE — Behavioral Health Treatment Team (Signed)
Patient remains isolative today, resting in bed. Came out for snacks,meals, and medications. Less irritable,states he feels better and slept well last night. Patient is happy with medication change and looking forward to going home. Continue safety checks, and encourage patient to attend groups.

## 2015-05-14 NOTE — Behavioral Health Treatment Team (Cosign Needed)
GROUP THERAPY PROGRESS NOTE    Corey Crawford is participating in Reflections.     Group time: 30 minutes    Personal goal for participation: to review daily goal    Goal orientation: personal    Group therapy participation: active    Therapeutic interventions reviewed and discussed: yes    Impression of participation: cooperative

## 2015-05-14 NOTE — Progress Notes (Signed)
Problem: Altered Thought Process (Adult/Pediatric)  Goal: *STG: Remains safe in hospital  Outcome: Resolved/Met Date Met:  05/14/15  Q15 min checks performed for safety. Patient has been visible in the milieu most of the shift.  Goal: *STG: Attends activities and groups  Outcome: Resolved/Met Date Met:  05/14/15  Patient has been attending and actively participating groups.Patient interacts with staff and on a lesser extent, with specific peers. Eye contact is poor during conversations.Patient is desirous to be discharged home. Affect blunted; mood euthymic.  Goal: *STG: Absence of lethality  Outcome: Progressing Towards Goal  Patient denied SI/HI.

## 2015-05-14 NOTE — Progress Notes (Signed)
Problem: Altered Thought Process (Adult/Pediatric)  Goal: *STG: Remains safe in hospital  Outcome: Progressing Towards Goal  Pt resting quietly in bed with eyes closed,  no sign of any distress at this time. Pt will continue to be monitored Q 15 minute for safety and needs. Pt slept about 7 hours.

## 2015-05-14 NOTE — Behavioral Health Treatment Team (Signed)
PSYCHIATRIC PROGRESS NOTE         Patient Name  Corey Crawford   Date of Birth 26-Sep-1946   CSN [id number]   Medical Record Number  [id number]      Age  69 y.o.   PCP Phys Other, MD   Admit date:  05/04/2015    Room Number  324/01  @ [facility]   Date of Service  05/14/2015          PSYCHOTHERAPY SESSION NOTE:  Length of psychotherapy session: 20 minutes    Main condition/diagnosis/issues treated during session today, 05/14/2015 :  paranoia, denial, mania    I employed  supportive psychotherapy in regards to various ongoing psychosocial stressors, including the following: pre-admission and current problems; housing issues; medical issues; and stress of hospitalization. Interpersonal relationship issues and psychodynamic conflicts explored.  Attempts made to alleviate maladaptive patterns. We, also, worked on issues of denial & effects of substance dependency/use     Overall, patient is minimally progressing    Treatment Plan Update (reviewed an updated 05/14/2015) :  I will modify psychotherapy tx plan by implementing more stress management strategies, building upon cognitive behavioral techniques, increasing coping skills, as well as shoring up psychological defenses).    An extended energy and skill set was needed to engage pt in psychotherapy due to some of the following: resistiveness, complexity, negativity, confrontational nature, hostile behaviors, and/or severe abnormalities in thought processes/psychosis resulting in the loss of expressive/receptive language communication skills.                            E & M PROGRESS NOTE:         HISTORY       CC:  "I want to go home"  HISTORY OF PRESENT ILLNESS/INTERVAL HISTORY:  (reviewed/updated 05/14/2015).  In summary, Corey Crawford, is a 69 y.o.  male who presents with a severe exacerbation of the principal diagnosis, Bipolar disorder with psychotic features (HCC), which is improving and not stable. Patient requires  continued inpatient hospitalization for further stabilization, safety monitoring and medication management.  I will continue to coordinate the provision of individual, milieu, occupational, group, and substance abuse therapies to address target symptoms/diagnoses as deemed appropriate for the individual patient.  A coordinated, multidisplinary treatment team round was conducted with the patient (this team consists of the nurse, psychiatric unit pharmcist, Administrator).     per initial evaluation: The patient, Corey Crawford, is a 69 y.o.?? WHITE OR CAUCASIAN male with a past psychiatric history significant for chronic mental illness , who presents at this time for a severe exacerbation of the principle diagnosis of Bipolar disorder with psychotic features (HCC). Patient reports/evidences the following emotional symptoms:?? agitation, mania and psychosis.?? Additional symptomatology include agitation, anger outbursts, difficulty sleeping, increased irritability and problem with medication.?? The above symptoms have been present for . These symptoms are of severe in?? severity per patient's report. The symptoms are constant?? fleeting in nature.?? The patient's condition has been precipitated by and psychosocial stressors( multiple losses,death in family,diagnosed with colon cancer ).?? Patient's condition made worse by treatment noncompliance. UDS negative, BAL=0. ??  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     Corey Crawford presents/reports/evidences the following emotional symptoms today, 05/14/2015:  manic.  The above symptoms have been present for few weeks. These symptoms are of severe severity. The symptoms are constant in nature. Additional symptomatology include delusions, disorganized thought processes/flight of ideas, grandiose, difficulty sleeping, and irritability.      SIDE EFFECTS: (reviewed/updated 05/14/2015)  None reported or admitted to.  No noted toxicity with use of lithium   ALLERGIES:(reviewed/updated 05/14/2015)  No Known Allergies   MEDICATIONS PRIOR TO ADMISSION:(reviewed/updated 05/14/2015)  Prescriptions prior to admission   Medication Sig   ??? lithium carbonate 600 mg capsule Take 600 mg by mouth nightly. Indications: BIPOLAR DISORDER   ??? atenolol (TENORMIN) 25 mg tablet Take 25 mg by mouth daily. Indications: HYPERTENSION   ??? amLODIPine (NORVASC) 10 mg tablet Take 10 mg by mouth daily. Indications: HYPERTENSION   ??? haloperidol (HALDOL) 2 mg tablet Take 2 mg by mouth nightly. Indications: BIIPOLAR DISORDER   ??? diphenhydrAMINE (BENADRYL) 25 mg capsule Take 25 mg by mouth nightly.      PAST MEDICAL HISTORY: Past medical history from the initial psychiatric evaluation has been reviewed (reviewed/updated 05/14/2015) with no additional updates (I asked patient and no additional past medical history provided). No past medical history on file.No past surgical history on file.   SOCIAL HISTORY: Social history from the initial psychiatric evaluation has been reviewed (reviewed/updated 05/14/2015) with no additional updates (I asked patient and no additional social history provided).   History     Social History   ??? Marital Status: LEGALLY SEPARATED     Spouse Name: N/A   ??? Number of Children: N/A   ??? Years of Education: N/A     Occupational History   ??? Not on file.     Social History Main Topics   ??? Smoking status: Not on file   ??? Smokeless tobacco: Not on file   ??? Alcohol Use: Not on file    ??? Drug Use: Not on file   ??? Sexual Activity: Not on file     Other Topics Concern   ??? Not on file     Social History Narrative   ??? No narrative on file      FAMILY HISTORY: Family history from the initial psychiatric evaluation has been reviewed (reviewed/updated 05/14/2015) with no additional updates (I asked patient and no additional family history provided).   Family History   Problem Relation Age of Onset   ??? Bipolar Disorder Father      committed suicide       REVIEW OF SYSTEMS: (reviewed/updated 05/14/2015)  Appetite:good   Sleep: good   All other Review of Systems:   negative for - suicidal ideation  Respiratory ROS: no cough, shortness of breath, or wheezing  Cardiovascular ROS: no chest pain or dyspnea on exertion         MENTAL STATUS EXAM & VITALS     MENTAL STATUS EXAM (MSE):    MSE FINDINGS ARE WITHIN NORMAL LIMITS (WNL) UNLESS OTHERWISE STATED BELOW. ( ALL OF THE BELOW CATEGORIES OF THE MSE HAVE BEEN REVIEWED (reviewed 05/14/2015) AND UPDATED AS DEEMED APPROPRIATE )  General Presentation age appropriate, casually dressed and older than stated age, unreliable and vague, very poor eye contact   Orientation oriented to time, place and person  Vital Signs  See below (reviewed 05/14/2015); Vital Signs (BP, Pulse, & Temp) are within normal limits if not listed below.   Gait and Station Stable/steady, no ataxia   Musculoskeletal System No extrapyramidal symptoms (EPS); no abnormal muscular movements or Tardive Dyskinesia (TD); muscle strength and tone are within normal limits   Language No aphasia or dysarthria   Speech:  soft   Thought Processes illogical, wnl rate of thoughts, poor abstract reasoning and computation   Thought Associations tangential   Thought Content Delusions---poorly circumscribed , no hallucinations   Suicidal Ideations none   Homicidal Ideations none   Mood:  Irritable, labile   Affect:  Constricted, euthymic, irritable, labile   Memory recent  adequate   Memory remote:  adequate    Concentration/Attention:  impaired   Fund of Knowledge average   Insight:  limited   Reliability poor   Judgment:  limited          VITALS:     Patient Vitals for the past 24 hrs:   Temp Pulse Resp BP   05/14/15 0557 98.4 ??F (36.9 ??C) (!) 51 16 142/65 mmHg   05/13/15 2217 - (!) 53 16 156/67 mmHg     Wt Readings from Last 3 Encounters:   05/13/15 95.255 kg (210 lb)     Temp Readings from Last 3 Encounters:   05/14/15 98.4 ??F (36.9 ??C)      BP Readings from Last 3 Encounters:   05/14/15 142/65     Pulse Readings from Last 3 Encounters:   05/14/15 51            DATA     LABORATORY DATA:(reviewed/updated 05/14/2015)  No results found for this or any previous visit (from the past 24 hour(s)).  No results found for: VALF2, VALAC, VALP, VALPR, DS6, CRBAM, CRBAMP, CARB2, XCRBAM  Lab Results   Component Value Date/Time    LITHIUM 0.91 05/12/2015 05:53 AM      RADIOLOGY REPORTS:(reviewed/updated 05/14/2015)  No results found.       MEDICATIONS     ALL MEDICATIONS:   Current Facility-Administered Medications   Medication Dose Route Frequency   ??? [START ON 05/15/2015] ARIPiprazole (ABILIFY) tablet 30 mg  30 mg Oral DAILY   ??? lithium carbonate capsule 900 mg  900 mg Oral QHS   ??? divalproex ER (DEPAKOTE ER) 24 hour tablet 1,750 mg  1,750 mg Oral QHS   ??? clonazePAM (KlonoPIN) tablet 0.25 mg  0.25 mg Oral BID   ??? atenolol (TENORMIN) tablet 25 mg  25 mg Oral DAILY   ??? amLODIPine (NORVASC) tablet 10 mg  10 mg Oral DAILY   ??? benztropine (COGENTIN) tablet 1 mg  1 mg Oral BID PRN   ??? benztropine (COGENTIN) injection 1 mg  1 mg IntraMUSCular Q12H PRN   ??? LORazepam (ATIVAN) injection 0.5 mg  0.5 mg IntraMUSCular Q4H PRN   ??? LORazepam (ATIVAN) tablet 0.5 mg  0.5 mg Oral Q4H PRN   ??? zolpidem (AMBIEN) tablet 5 mg  5 mg Oral QHS PRN   ??? acetaminophen (TYLENOL) tablet 650 mg  650 mg Oral Q4H PRN   ??? magnesium hydroxide (MILK OF MAGNESIA) oral suspension 30 mL  30 mL Oral DAILY PRN    ??? nicotine (NICODERM CQ) 21 mg/24 hr patch 1 Patch  1 Patch TransDERmal DAILY PRN      SCHEDULED MEDICATIONS:   Current Facility-Administered Medications   Medication Dose Route Frequency   ??? [START ON 05/15/2015] ARIPiprazole (ABILIFY)  tablet 30 mg  30 mg Oral DAILY   ??? lithium carbonate capsule 900 mg  900 mg Oral QHS   ??? divalproex ER (DEPAKOTE ER) 24 hour tablet 1,750 mg  1,750 mg Oral QHS   ??? clonazePAM (KlonoPIN) tablet 0.25 mg  0.25 mg Oral BID   ??? atenolol (TENORMIN) tablet 25 mg  25 mg Oral DAILY   ??? amLODIPine (NORVASC) tablet 10 mg  10 mg Oral DAILY          ASSESSMENT & PLAN     DIAGNOSES REQUIRING ACTIVE TREATMENT AND MONITORING: (reviewed/updated 05/14/2015)  Patient Active Hospital Problem List:     Bipolar disorder with psychotic features (HCC) (05/05/2015)    Assessment: still psychotic and manic. Some mild changes noted today. More coherent in nature.    Plan: I will continue to adjust psych meds. Will get collateral information to verify his baseline.  I will continue to monitor blood levels ( lithium---a drug with a narrow therapeutic index= NTI) and associated labs for drug therapy implemented that require intense monitoring for toxicity as deemed appropriate base on current medication side effects and pharmacodynamically determined drug 1/2 lives. Last  Li level =0.91, not quite at steady state though. Given very limited response to tx thus far, will augment with depakote.       Coarse tremors (05/05/2015)    Assessment: stable, improved    Plan: continue to mnoitor     Hypertension (05/05/2015)    Assessment: stable    Plan: monitor. Cont with bp meds            Complete current electronic health record for patient has been reviewed today including consultant notes, ancillary staff notes, nurses and psychiatric tech notes.      The following regarding medications was addressed during rounds with patient:   the risks and benefits of the proposed medication. The patient was given  the opportunity to ask questions. Informed consent given to the use of the above medications. Will continue to adjust psychiatric and non-psychiatric medications (see above "medication" section and orders section for details) as deemed appropriate & based upon diagnoses and response to treatment.     I will continue to order blood tests/labs and diagnostic tests as deemed appropriate and review results as they become available (see orders for details and above listed lab/test results).    I will order psychiatric records from previous psych hospitals to further elucidate the nature of patient's psychopathology and review once available.    I will gather additional collateral information from friends, family and o/p treatment team to further elucidate the nature of patient's psychopathology and baselline level of psychiatric functioning.         I certify that this patient's inpatient psychiatric hospital services furnished since the previous certification were, and continue to be, required for treatment that could reasonably be expected to improve the patient's condition, or for diagnostic study, and that the patient continues to need, on a daily basis, active treatment furnished directly by or requiring the supervision of inpatient psychiatric facility personnel. In addition, the hospital records show that services furnished were intensive treatment services, admission or related services, or equivalent services.    EXPECTED DISCHARGE DATE/DAY: TBD     DISPOSITION: Home  Psych FU with in [place] ?       Signed By:   Mackie Pai, MD  05/14/2015

## 2015-05-15 MED FILL — ABILIFY 15 MG TABLET: 15 mg | ORAL | Qty: 2

## 2015-05-15 MED FILL — MAPAP (ACETAMINOPHEN) 325 MG TABLET: 325 mg | ORAL | Qty: 2

## 2015-05-15 MED FILL — DEPAKOTE ER 500 MG TABLET,EXTENDED RELEASE: 500 mg | ORAL | Qty: 3

## 2015-05-15 MED FILL — ATENOLOL 25 MG TAB: 25 mg | ORAL | Qty: 1

## 2015-05-15 MED FILL — CLONAZEPAM 0.5 MG TAB: 0.5 mg | ORAL | Qty: 1

## 2015-05-15 MED FILL — AMLODIPINE 5 MG TAB: 5 mg | ORAL | Qty: 2

## 2015-05-15 MED FILL — LITHIUM CARBONATE 300 MG CAP: 300 mg | ORAL | Qty: 3

## 2015-05-15 NOTE — Behavioral Health Treatment Team (Cosign Needed)
GROUP THERAPY PROGRESS NOTE    The patient Corey Crawford a 69 y.o. male is participating in [employer].     Group time: 1 hour    Personal goal for participation: To concentrate on selected task    Goal orientation: social    Group therapy participation: minimal    Therapeutic interventions reviewed and discussed: Crafts, games, music    Impression of participation: The patient was present-elected to watch    BEVERLY S BAKER  05/15/2015  5:19 PM

## 2015-05-15 NOTE — Behavioral Health Treatment Team (Signed)
PSYCHIATRIC PROGRESS NOTE         Patient Name  Corey Crawford   Date of Birth 1946/07/20   CSN [id number]   Medical Record Number  [id number]      Age  69 y.o.   PCP Phys Other, MD   Admit date:  05/04/2015    Room Number  324/01  @ [facility]   Date of Service  05/15/2015          PSYCHOTHERAPY SESSION NOTE:  Length of psychotherapy session: 20 minutes    Main condition/diagnosis/issues treated during session today, 05/15/2015 :  paranoia, denial, mania    I employed  supportive psychotherapy in regards to various ongoing psychosocial stressors, including the following: pre-admission and current problems; housing issues; medical issues; and stress of hospitalization. Interpersonal relationship issues and psychodynamic conflicts explored.  Attempts made to alleviate maladaptive patterns. We, also, worked on issues of denial & effects of substance dependency/use     Overall, patient is minimally progressing    Treatment Plan Update (reviewed an updated 05/15/2015) :  I will modify psychotherapy tx plan by implementing more stress management strategies, building upon cognitive behavioral techniques, increasing coping skills, as well as shoring up psychological defenses).    An extended energy and skill set was needed to engage pt in psychotherapy due to some of the following: resistiveness, complexity, negativity, confrontational nature, hostile behaviors, and/or severe abnormalities in thought processes/psychosis resulting in the loss of expressive/receptive language communication skills.                            E & M PROGRESS NOTE:         HISTORY       CC:  "I'm better"  HISTORY OF PRESENT ILLNESS/INTERVAL HISTORY:  (reviewed/updated 05/15/2015).  In summary, Corey Crawford, is a 69 y.o.  male who presents with a severe exacerbation of the principal diagnosis, Bipolar disorder with psychotic features (HCC), which is improving and not stable. Patient requires  continued inpatient hospitalization for further stabilization, safety monitoring and medication management.  I will continue to coordinate the provision of individual, milieu, occupational, group, and substance abuse therapies to address target symptoms/diagnoses as deemed appropriate for the individual patient.  A coordinated, multidisplinary treatment team round was conducted with the patient (this team consists of the nurse, psychiatric unit pharmcist, Administrator).     per initial evaluation: The patient, Corey Crawford, is a 69 y.o.?? WHITE OR CAUCASIAN male with a past psychiatric history significant for chronic mental illness , who presents at this time for a severe exacerbation of the principle diagnosis of Bipolar disorder with psychotic features (HCC). Patient reports/evidences the following emotional symptoms:?? agitation, mania and psychosis.?? Additional symptomatology include agitation, anger outbursts, difficulty sleeping, increased irritability and problem with medication.?? The above symptoms have been present for . These symptoms are of severe in?? severity per patient's report. The symptoms are constant?? fleeting in nature.?? The patient's condition has been precipitated by and psychosocial stressors( multiple losses,death in family,diagnosed with colon cancer ).?? Patient's condition made worse by treatment noncompliance. UDS negative, BAL=0. ??  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     Corey Crawford presents/reports/evidences the following emotional symptoms today, 05/15/2015:  manic.  The above symptoms have been present for few months, was at a dif hosp for 2 weeks just prior to this admission. These symptoms are of severe severity. The symptoms are constant in nature. Additional symptomatology include decreased delusions, decreased disorganized thought processes/flight of ideas, decreased grandiose, improved sleep, and irritability.      SIDE EFFECTS: (reviewed/updated 05/15/2015)  None reported or admitted to.  No noted toxicity with use of lithium or depakote   ALLERGIES:(reviewed/updated 05/15/2015)  No Known Allergies   MEDICATIONS PRIOR TO ADMISSION:(reviewed/updated 05/15/2015)  Prescriptions prior to admission   Medication Sig   ??? lithium carbonate 600 mg capsule Take 600 mg by mouth nightly. Indications: BIPOLAR DISORDER   ??? atenolol (TENORMIN) 25 mg tablet Take 25 mg by mouth daily. Indications: HYPERTENSION   ??? amLODIPine (NORVASC) 10 mg tablet Take 10 mg by mouth daily. Indications: HYPERTENSION   ??? haloperidol (HALDOL) 2 mg tablet Take 2 mg by mouth nightly. Indications: BIIPOLAR DISORDER   ??? diphenhydrAMINE (BENADRYL) 25 mg capsule Take 25 mg by mouth nightly.      PAST MEDICAL HISTORY: Past medical history from the initial psychiatric evaluation has been reviewed (reviewed/updated 05/15/2015) with no additional updates (I asked patient and no additional past medical history provided). No past medical history on file.No past surgical history on file.   SOCIAL HISTORY: Social history from the initial psychiatric evaluation has been reviewed (reviewed/updated 05/15/2015) with no additional updates (I asked patient and no additional social history provided).   History     Social History   ??? Marital Status: LEGALLY SEPARATED     Spouse Name: N/A   ??? Number of Children: N/A ??? Years of Education: N/A     Occupational History   ??? Not on file.     Social History Main Topics    ??? Smoking status: Not on file   ??? Smokeless tobacco: Not on file   ??? Alcohol Use: Not on file   ??? Drug Use: Not on file   ??? Sexual Activity: Not on file     Other Topics Concern   ??? Not on file     Social History Narrative   ??? No narrative on file      FAMILY HISTORY: Family history from the initial psychiatric evaluation has been reviewed (reviewed/updated 05/15/2015) with no additional updates (I asked patient and no additional family history provided).   Family History   Problem Relation Age of Onset   ??? Bipolar Disorder Father      committed suicide       REVIEW OF SYSTEMS: (reviewed/updated 05/15/2015)  Appetite:good   Sleep: good   All other Review of Systems:   negative for - suicidal ideation  Respiratory ROS: no cough, shortness of breath, or wheezing  Cardiovascular ROS: no chest pain or dyspnea on exertion         MENTAL STATUS EXAM & VITALS     MENTAL STATUS EXAM (MSE):    MSE FINDINGS ARE WITHIN NORMAL LIMITS (WNL) UNLESS OTHERWISE STATED BELOW. ( ALL OF THE BELOW CATEGORIES OF THE MSE HAVE BEEN REVIEWED (reviewed 05/15/2015) AND UPDATED AS DEEMED APPROPRIATE )  General Presentation age appropriate, casually dressed and older than stated age, unreliable and vague,  very poor eye contact   Orientation oriented to time, place and person   Vital Signs  See below (reviewed 05/15/2015); Vital Signs (BP, Pulse, & Temp) are within normal limits if not listed below.   Gait and Station Stable/steady, no ataxia   Musculoskeletal System No extrapyramidal symptoms (EPS); no abnormal muscular movements or Tardive Dyskinesia (TD); muscle strength and tone are within normal limits   Language No aphasia or dysarthria   Speech:  soft   Thought Processes logical, wnl rate of thoughts, fair abstract reasoning and computation   Thought Associations Tangential, mostly goal directed today    Thought Content Delusions---poorly circumscribed, no hallucinations   Suicidal Ideations none   Homicidal Ideations none    Mood:  Irritable, labile   Affect:  euthymic, irritable, labile. Better eye contact   Memory recent  adequate   Memory remote:  adequate   Concentration/Attention:  impaired   Fund of Knowledge average   Insight:  limited   Reliability poor   Judgment:  limited          VITALS:     Patient Vitals for the past 24 hrs:   Temp Pulse Resp BP   05/15/15 1653 - 68 - -   05/15/15 0553 98 ??F (36.7 ??C) (!) 55 16 151/69 mmHg   05/14/15 1951 - (!) 49 - 137/66 mmHg     Wt Readings from Last 3 Encounters:   05/13/15 95.255 kg (210 lb)     Temp Readings from Last 3 Encounters:   05/15/15 98 ??F (36.7 ??C)      BP Readings from Last 3 Encounters:   05/15/15 151/69     Pulse Readings from Last 3 Encounters: 05/15/15 68            DATA     LABORATORY DATA:(reviewed/updated 05/15/2015)  No results found for this or any previous visit (from the past 24 hour(s)).  No results found for: VALF2, VALAC, VALP, VALPR, DS6, CRBAM, CRBAMP, CARB2, XCRBAM  Lab Results   Component Value Date/Time    LITHIUM 0.91 05/12/2015 05:53 AM      RADIOLOGY REPORTS:(reviewed/updated 05/15/2015)  No results found.       MEDICATIONS     ALL MEDICATIONS:   Current Facility-Administered Medications   Medication Dose Route Frequency   ??? ARIPiprazole (ABILIFY) tablet 30 mg  30 mg Oral DAILY   ??? clonazePAM (KlonoPIN) tablet 0.25 mg  0.25 mg Oral BID   ??? lithium carbonate capsule 900 mg  900 mg Oral QHS   ??? divalproex ER (DEPAKOTE ER) 24 hour tablet 1,750 mg  1,750 mg Oral QHS   ??? atenolol (TENORMIN) tablet 25 mg  25 mg Oral DAILY   ??? amLODIPine (NORVASC) tablet 10 mg  10 mg Oral DAILY   ??? benztropine (COGENTIN) tablet 1 mg  1 mg Oral BID PRN   ??? benztropine (COGENTIN) injection 1 mg  1 mg IntraMUSCular Q12H PRN   ??? LORazepam (ATIVAN) injection 0.5 mg  0.5 mg IntraMUSCular Q4H PRN   ??? LORazepam (ATIVAN) tablet 0.5 mg  0.5 mg Oral Q4H PRN   ??? zolpidem (AMBIEN) tablet 5 mg  5 mg Oral QHS PRN   ??? acetaminophen (TYLENOL) tablet 650 mg  650 mg Oral Q4H PRN    ??? magnesium hydroxide (MILK OF MAGNESIA) oral suspension 30 mL  30 mL Oral DAILY PRN ??? nicotine (NICODERM CQ) 21 mg/24 hr patch 1 Patch  1 Patch TransDERmal DAILY PRN  SCHEDULED MEDICATIONS:   Current Facility-Administered Medications   Medication Dose Route Frequency   ??? ARIPiprazole (ABILIFY) tablet 30 mg  30 mg Oral DAILY   ??? clonazePAM (KlonoPIN) tablet 0.25 mg  0.25 mg Oral BID   ??? lithium carbonate capsule 900 mg  900 mg Oral QHS   ??? divalproex ER (DEPAKOTE ER) 24 hour tablet 1,750 mg  1,750 mg Oral QHS   ??? atenolol (TENORMIN) tablet 25 mg  25 mg Oral DAILY   ??? amLODIPine (NORVASC) tablet 10 mg  10 mg Oral DAILY          ASSESSMENT & PLAN     DIAGNOSES REQUIRING ACTIVE TREATMENT AND MONITORING: (reviewed/updated 05/15/2015)  Patient Active Hospital Problem List:     Bipolar disorder with psychotic features (HCC) (05/05/2015)    Assessment: much less psychotic and manic over the last couple days since depakote added.     Plan: I will continue to adjust psych meds. Will get collateral information to verify his baseline.  I will continue to monitor blood levels ( lithium---a drug with a narrow therapeutic index= NTI) and associated labs for drug therapy implemented that require intense monitoring for toxicity as deemed appropriate base on current medication side effects and pharmacodynamically determined drug 1/2 lives. Last  Li level =0.91, not quite at steady state though.       Coarse tremors (05/05/2015)    Assessment: stable, improved    Plan: continue to mnoitor     Hypertension (05/05/2015)    Assessment: stable    Plan: monitor. Cont with bp meds            Complete current electronic health record for patient has been reviewed today including consultant notes, ancillary staff notes, nurses and psychiatric tech notes.      The following regarding medications was addressed during rounds with patient:   the risks and benefits of the proposed medication. The patient was given  the opportunity to ask questions. Informed consent given to the use of the above medications. Will continue to adjust psychiatric and non-psychiatric medications (see above "medication" section and orders section for details) as deemed appropriate & based upon diagnoses and response to treatment.     I will continue to order blood tests/labs and diagnostic tests as deemed appropriate and review results as they become available (see orders for details and above listed lab/test results).    I will order psychiatric records from previous psych hospitals to further elucidate the nature of patient's psychopathology and review once available.    I will gather additional collateral information from friends, family and o/p treatment team to further elucidate the nature of patient's psychopathology and baselline level of psychiatric functioning.         I certify that this patient's inpatient psychiatric hospital services furnished since the previous certification were, and continue to be, required for treatment that could reasonably be expected to improve the patient's condition, or for diagnostic study, and that the patient continues to need, on a daily basis, active treatment furnished directly by or requiring the supervision of inpatient psychiatric facility personnel. In addition, the hospital records show that services furnished were intensive treatment services, admission or related services, or equivalent services.    EXPECTED DISCHARGE DATE/DAY: TBD     DISPOSITION: Home  Psych FU with in [place] ?       Signed By:   Mackie Pai, MD  05/15/2015

## 2015-05-15 NOTE — Behavioral Health Treatment Team (Signed)
Patient has been isolative to room except during groups. Patient has made no delusional references today. Patient has been compliant with medication therapy. Patient's affect is blunt.

## 2015-05-15 NOTE — Behavioral Health Treatment Team (Addendum)
Patient has shown improvement, he no longer has pressured speech and is more stable in mood.  He has poor eye contact and little insight to his mania and what brought him into the hospital.  He noted he's sleeping better and his projected discharge is Monday.  Worker spoke with patient's sister who will talk to the patient tonight and inform worker of how she thinks he's doing.     Montel Culver, LCSW

## 2015-05-15 NOTE — Progress Notes (Signed)
Mr. Comstock attended spirituality group.    Rev. Knute Neu Doristine Counter, M.Div, [facility]  Lead Chaplain

## 2015-05-15 NOTE — Progress Notes (Signed)
Diet as tolerated.  PMH unremarkable per nutrition  Ht: 5'9"  Wt: 210 lbs  BMI 31 kg/(m^2) c/w obesity grade I  Est energy needs: 1785 kcal, 70 g protein, 1 mL/kcal fluids  Pt will consume 75% of meals at follow up

## 2015-05-15 NOTE — Behavioral Health Treatment Team (Cosign Needed)
Pt did not attend coping skills group.

## 2015-05-15 NOTE — Progress Notes (Signed)
Problem: Altered Thought Process (Adult/Pediatric)  Goal: *STG: Decreased delusional thinking  Outcome: Progressing Towards Goal  Pt resting quietly in bed with eyes closed,  no sign of any distress at this time. Pt will continue to be monitored Q 15 minute for safety and needs. Pt slept about 7 hours. Tylenol PRN given for c/o of back pain.

## 2015-05-15 NOTE — Behavioral Health Treatment Team (Cosign Needed)
GROUP THERAPY PROGRESS NOTE    Corey Crawford is participating in Reflections.     Group time: 30 minutes    Personal goal for participation: to review daily goal    Goal orientation: personal    Group therapy participation: minimal    Therapeutic interventions reviewed and discussed: yes    Impression of participation: needed prompting

## 2015-05-15 NOTE — Behavioral Health Treatment Team (Signed)
Patient is much better. No complaints, states he is sleeping well and the medication regimen is working. Talked on phone with his daughter tonight,came to day room for groups, meals and snacks. Continue safety checks, and encourage patient to set goals for discharge plans.

## 2015-05-16 MED FILL — CLONAZEPAM 0.5 MG TAB: 0.5 mg | ORAL | Qty: 1

## 2015-05-16 MED FILL — MAPAP (ACETAMINOPHEN) 325 MG TABLET: 325 mg | ORAL | Qty: 2

## 2015-05-16 MED FILL — ABILIFY 15 MG TABLET: 15 mg | ORAL | Qty: 2

## 2015-05-16 MED FILL — ATENOLOL 25 MG TAB: 25 mg | ORAL | Qty: 1

## 2015-05-16 MED FILL — DEPAKOTE ER 500 MG TABLET,EXTENDED RELEASE: 500 mg | ORAL | Qty: 3

## 2015-05-16 MED FILL — LITHIUM CARBONATE 300 MG CAP: 300 mg | ORAL | Qty: 3

## 2015-05-16 MED FILL — AMLODIPINE 5 MG TAB: 5 mg | ORAL | Qty: 2

## 2015-05-16 NOTE — Behavioral Health Treatment Team (Cosign Needed)
Pt visible on the unit for meals and meds, isolates in his room during free time.  Pt attended select evening groups, limited interaction with peers.  Pt offered no new self disclosures, denies S/I H/I A/H.  Pt affect flat, no signs of responding to internal stimuli noted.  Pt remain on  Q 15 min checks for safety, will offer support when needed.

## 2015-05-16 NOTE — Progress Notes (Signed)
Problem: Altered Thought Process (Adult/Pediatric)  Goal: *STG: Decreased hallucinations  Outcome: Progressing Towards Goal  Pt remains isolative and withdrawn to his room. Out of his room with much encouragement. He remains preoccupied and distracted. He does have minimal eye contact. Pt did receive Tylenol 650mg  for back pain. Reinforce reality and encouraged more therapeutic interactions. Pt is compliant with his meals and medications. Will continue to monitor closely.

## 2015-05-16 NOTE — Behavioral Health Treatment Team (Cosign Needed)
GROUP THERAPY PROGRESS NOTE    Corey Crawford is participating in Community.     Group time: 30 minutes    Personal goal for participation: daily orientation    Goal orientation: personal    Group therapy participation: minimal    Therapeutic interventions reviewed and discussed: yes    Impression of participation: needed prompting

## 2015-05-16 NOTE — Behavioral Health Treatment Team (Signed)
Patient has been resting in their room with their eyes closed and has showed no signs of distress through out the shift. Patient slept for 7 hours. Patient is on every 15 minute checks for safety.

## 2015-05-16 NOTE — Behavioral Health Treatment Team (Cosign Needed)
Pt did not attend creative expression group.

## 2015-05-16 NOTE — Behavioral Health Treatment Team (Cosign Needed)
GROUP THERAPY PROGRESS NOTE    The patient Corey Crawford a 69 y.o. male is participating in Coping Skills Group.     Group time: 45 minutes    Personal goal for participation: To develop a personal plan for success    Goal orientation:  personal    Group therapy participation: active    Therapeutic interventions reviewed and discussed: positive coping strategies/goals    Impression of participation:  The patient was attentive.    BEVERLY S BAKER  05/16/2015  2:17 PM

## 2015-05-16 NOTE — Behavioral Health Treatment Team (Signed)
PSYCHIATRIC PROGRESS NOTE         Patient Name  Corey Crawford   Date of Birth 1945/12/25   CSN [id number]   Medical Record Number  [id number]      Age  69 y.o.   PCP Phys Other, MD   Admit date:  05/04/2015    Room Number  324/01  @ -[facility]   Date of Service  05/16/2015          PSYCHOTHERAPY SESSION NOTE:  Length of psychotherapy session: 20 minutes    Main condition/diagnosis/issues treated during session today, 05/16/2015 :  paranoia, denial, mania, tx compliance    I employed  supportive psychotherapy in regards to various ongoing psychosocial stressors, including the following: pre-admission and current problems; housing issues; medical issues; and stress of hospitalization. Interpersonal relationship issues and psychodynamic conflicts explored.  Attempts made to alleviate maladaptive patterns. We, also, worked on issues of denial & effects of substance dependency/use     Overall, patient is progressing    Treatment Plan Update (reviewed an updated 05/16/2015) :  I will modify psychotherapy tx plan by implementing more stress management strategies, building upon cognitive behavioral techniques, increasing coping skills, as well as shoring up psychological defenses).    An extended energy and skill set was needed to engage pt in psychotherapy due to some of the following: resistiveness, complexity, negativity, confrontational nature, hostile behaviors, and/or severe abnormalities in thought processes/psychosis resulting in the loss of expressive/receptive language communication skills.                            E & M PROGRESS NOTE:         HISTORY       CC:  "I'm better"  HISTORY OF PRESENT ILLNESS/INTERVAL HISTORY:  (reviewed/updated 05/16/2015).  In summary, Corey Crawford, is a 69 y.o.  male who presents with a severe exacerbation of the principal diagnosis, Bipolar disorder with psychotic features (HCC), which is improving and not stable. Patient requires  continued inpatient hospitalization for further stabilization, safety monitoring and medication management.  I will continue to coordinate the provision of individual, milieu, occupational, group, and substance abuse therapies to address target symptoms/diagnoses as deemed appropriate for the individual patient.  A coordinated, multidisplinary treatment team round was conducted with the patient (this team consists of the nurse, psychiatric unit pharmcist, Administrator).     per initial evaluation: The patient, Corey Crawford, is a 69 y.o.?? WHITE OR CAUCASIAN male with a past psychiatric history significant for chronic mental illness , who presents at this time for a severe exacerbation of the principle diagnosis of Bipolar disorder with psychotic features (HCC). Patient reports/evidences the following emotional symptoms:?? agitation, mania and psychosis.?? Additional symptomatology include agitation, anger outbursts, difficulty sleeping, increased irritability and problem with medication.?? The above symptoms have been present for . These symptoms are of severe in?? severity per patient's report. The symptoms are constant?? fleeting in nature.?? The patient's condition has been precipitated by and psychosocial stressors( multiple losses,death in family,diagnosed with colon cancer ).?? Patient's condition made worse by treatment noncompliance. UDS negative, BAL=0. ??  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 for treatment.     [name] presents/reports/evidences the following emotional symptoms today, 05/16/2015:  hypomania.  The above symptoms have been present for few months, was at a different hosp for 2 weeks just prior to this admission. These symptoms are of severe severity. The symptoms are constant in nature. Additional symptomatology include decreased delusions, decreased disorganized thought processes/flight of ideas, decreased grandiose, improved sleep, and irritability.      SIDE EFFECTS: (reviewed/updated 05/16/2015)  None reported or admitted to.  No noted toxicity with use of lithium or depakote   ALLERGIES:(reviewed/updated 05/16/2015)  No Known Allergies   MEDICATIONS PRIOR TO ADMISSION:(reviewed/updated 05/16/2015)  Prescriptions prior to admission   Medication Sig   ??? lithium carbonate 600 mg capsule Take 600 mg by mouth nightly. Indications: BIPOLAR DISORDER   ??? atenolol (TENORMIN) 25 mg tablet Take 25 mg by mouth daily. Indications: HYPERTENSION   ??? amLODIPine (NORVASC) 10 mg tablet Take 10 mg by mouth daily. Indications: HYPERTENSION   ??? haloperidol (HALDOL) 2 mg tablet Take 2 mg by mouth nightly. Indications: BIIPOLAR DISORDER   ??? diphenhydrAMINE (BENADRYL) 25 mg capsule Take 25 mg by mouth nightly.      PAST MEDICAL HISTORY: Past medical history from the initial psychiatric evaluation has been reviewed (reviewed/updated 05/16/2015) with no additional updates (I asked patient and no additional past medical history provided). No past medical history on file.No past surgical history on file.   SOCIAL HISTORY: Social history from the initial psychiatric evaluation has been reviewed (reviewed/updated 05/16/2015) with no additional updates (I asked patient and no additional social history provided).   History     Social History   ??? Marital Status: LEGALLY SEPARATED     Spouse Name: N/A   ??? Number of Children: N/A   ??? Years of Education: N/A     Occupational History   ??? Not on file.     Social History Main Topics    ??? Smoking status: Not on file   ??? Smokeless tobacco: Not on file   ??? Alcohol Use: Not on file   ??? Drug Use: Not on file   ??? Sexual Activity: Not on file     Other Topics Concern   ??? Not on file     Social History Narrative   ??? No narrative on file      FAMILY HISTORY: Family history from the initial psychiatric evaluation has been reviewed (reviewed/updated 05/16/2015) with no additional updates (I asked patient and no additional family history provided).   Family History   Problem Relation Age of Onset   ??? Bipolar Disorder Father      committed suicide       REVIEW OF SYSTEMS: (reviewed/updated 05/16/2015)  Appetite:good   Sleep: good   All other Review of Systems:   negative for - suicidal ideation  Respiratory ROS: no cough, shortness of breath, or wheezing  Cardiovascular ROS: no chest pain or dyspnea on exertion         MENTAL STATUS EXAM & VITALS     MENTAL STATUS EXAM (MSE):    MSE FINDINGS ARE WITHIN NORMAL LIMITS (WNL) UNLESS OTHERWISE STATED BELOW. ( ALL OF THE BELOW CATEGORIES OF THE MSE HAVE BEEN REVIEWED (reviewed 05/16/2015) AND UPDATED AS DEEMED APPROPRIATE )  General Presentation age appropriate, casually dressed and older than stated age,  unreliable and vague, very poor eye contact   Orientation oriented to time, place and person   Vital Signs  See below (reviewed 05/16/2015); Vital Signs (BP, Pulse, & Temp) are within normal limits if not listed below.   Gait and Station Stable/steady, no ataxia   Musculoskeletal System No extrapyramidal symptoms (EPS); no abnormal muscular movements or Tardive Dyskinesia (TD); muscle strength and tone are within normal limits   Language No aphasia or dysarthria   Speech:  Soft, under productive   Thought Processes logical, wnl rate of thoughts, fair abstract reasoning and computation   Thought Associations Goal directed     Thought Content Delusions denied, no hallucinations   Suicidal Ideations none   Homicidal Ideations none   Mood:  euthymic, labile    Affect:  euthymic, labile   Memory recent  adequate   Memory remote:  adequate   Concentration/Attention:  impaired   Fund of Knowledge average   Insight:  limited   Reliability poor   Judgment:  limited          VITALS:     Patient Vitals for the past 24 hrs:   Temp Pulse Resp BP SpO2   05/16/15 1729 97.9 ??F (36.6 ??C) (!) 55 18 127/70 mmHg 98 %   05/16/15 1113 - (!) 51 18 141/66 mmHg -   05/16/15 0631 95.8 ??F (35.4 ??C) (!) 54 18 168/73 mmHg -     Wt Readings from Last 3 Encounters:   05/13/15 95.255 kg (210 lb)     Temp Readings from Last 3 Encounters:   05/16/15 97.9 ??F (36.6 ??C)      BP Readings from Last 3 Encounters:   05/16/15 127/70     Pulse Readings from Last 3 Encounters:   05/16/15 55            DATA     LABORATORY DATA:(reviewed/updated 05/16/2015)  No results found for this or any previous visit (from the past 24 hour(s)).  No results found for: VALF2, VALAC, VALP, VALPR, DS6, CRBAM, CRBAMP, CARB2, XCRBAM  Lab Results   Component Value Date/Time    LITHIUM 0.91 05/12/2015 05:53 AM      RADIOLOGY REPORTS:(reviewed/updated 05/16/2015)  No results found.       MEDICATIONS     ALL MEDICATIONS:   Current Facility-Administered Medications   Medication Dose Route Frequency   ??? ARIPiprazole (ABILIFY) tablet 30 mg  30 mg Oral DAILY   ??? clonazePAM (KlonoPIN) tablet 0.25 mg  0.25 mg Oral BID   ??? lithium carbonate capsule 900 mg  900 mg Oral QHS   ??? divalproex ER (DEPAKOTE ER) 24 hour tablet 1,750 mg  1,750 mg Oral QHS   ??? atenolol (TENORMIN) tablet 25 mg  25 mg Oral DAILY   ??? amLODIPine (NORVASC) tablet 10 mg  10 mg Oral DAILY   ??? benztropine (COGENTIN) tablet 1 mg  1 mg Oral BID PRN   ??? benztropine (COGENTIN) injection 1 mg  1 mg IntraMUSCular Q12H PRN   ??? LORazepam (ATIVAN) injection 0.5 mg  0.5 mg IntraMUSCular Q4H PRN   ??? LORazepam (ATIVAN) tablet 0.5 mg  0.5 mg Oral Q4H PRN   ??? zolpidem (AMBIEN) tablet 5 mg  5 mg Oral QHS PRN   ??? acetaminophen (TYLENOL) tablet 650 mg  650 mg Oral Q4H PRN    ??? magnesium hydroxide (MILK OF MAGNESIA) oral suspension 30 mL  30 mL Oral DAILY PRN   ??? nicotine (NICODERM CQ) 21 mg/24 hr  patch 1 Patch  1 Patch TransDERmal DAILY PRN      SCHEDULED MEDICATIONS:   Current Facility-Administered Medications   Medication Dose Route Frequency   ??? ARIPiprazole (ABILIFY) tablet 30 mg  30 mg Oral DAILY   ??? clonazePAM (KlonoPIN) tablet 0.25 mg  0.25 mg Oral BID   ??? lithium carbonate capsule 900 mg  900 mg Oral QHS   ??? divalproex ER (DEPAKOTE ER) 24 hour tablet 1,750 mg  1,750 mg Oral QHS   ??? atenolol (TENORMIN) tablet 25 mg  25 mg Oral DAILY   ??? amLODIPine (NORVASC) tablet 10 mg  10 mg Oral DAILY          ASSESSMENT & PLAN     DIAGNOSES REQUIRING ACTIVE TREATMENT AND MONITORING: (reviewed/updated 05/16/2015)  Patient Active Hospital Problem List:     Bipolar disorder with psychotic features (HCC) (05/05/2015)    Assessment: much less psychotic and manic over the last few days since depakote added.     Plan: I will continue to adjust psych meds.   I will continue to monitor blood levels (depakote and lithium---drugs with a narrow therapeutic index= NTI) and associated labs for drug therapy implemented that require intense monitoring for toxicity as deemed appropriate base on current medication side effects and pharmacodynamically determined drug 1/2 lives. Last  Li level =0.91, not quite at steady state though. Check levels in am.       Coarse tremors (05/05/2015)    Assessment: stable, improved    Plan: continue to mnoitor     Hypertension (05/05/2015)    Assessment: stable    Plan: monitor. Cont to adjust bp meds            Complete current electronic health record for patient has been reviewed today including consultant notes, ancillary staff notes, nurses and psychiatric tech notes.      The following regarding medications was addressed during rounds with patient:   the risks and benefits of the proposed medication. The patient was given  the opportunity to ask questions. Informed consent given to the use of the above medications. Will continue to adjust psychiatric and non-psychiatric medications (see above "medication" section and orders section for details) as deemed appropriate & based upon diagnoses and response to treatment.     I will continue to order blood tests/labs and diagnostic tests as deemed appropriate and review results as they become available (see orders for details and above listed lab/test results).    I will order psychiatric records from previous psych hospitals to further elucidate the nature of patient's psychopathology and review once available.    I will gather additional collateral information from friends, family and o/p treatment team to further elucidate the nature of patient's psychopathology and baselline level of psychiatric functioning.         I certify that this patient's inpatient psychiatric hospital services furnished since the previous certification were, and continue to be, required for treatment that could reasonably be expected to improve the patient's condition, or for diagnostic study, and that the patient continues to need, on a daily basis, active treatment furnished directly by or requiring the supervision of inpatient psychiatric facility personnel. In addition, the hospital records show that services furnished were intensive treatment services, admission or related services, or equivalent services.    EXPECTED DISCHARGE DATE/DAY: Monday      DISPOSITION: Home  Psych FU with in [place] ?       Signed By:   Mackie Pai, MD  05/16/2015

## 2015-05-17 LAB — LITHIUM: Lithium level: 0.73 MMOL/L (ref 0.60–1.20)

## 2015-05-17 LAB — VALPROIC ACID: Valproic acid: 73 ug/ml (ref 50–100)

## 2015-05-17 MED ORDER — DIVALPROEX 500 MG 24 HR TAB
500 mg | Freq: Every evening | ORAL | Status: DC
Start: 2015-05-17 — End: 2015-05-31
  Administered 2015-05-18 – 2015-05-31 (×14): via ORAL

## 2015-05-17 MED FILL — LITHIUM CARBONATE 300 MG CAP: 300 mg | ORAL | Qty: 3

## 2015-05-17 MED FILL — CLONAZEPAM 0.5 MG TAB: 0.5 mg | ORAL | Qty: 1

## 2015-05-17 MED FILL — ATENOLOL 25 MG TAB: 25 mg | ORAL | Qty: 1

## 2015-05-17 MED FILL — DEPAKOTE ER 500 MG TABLET,EXTENDED RELEASE: 500 mg | ORAL | Qty: 3

## 2015-05-17 MED FILL — ABILIFY 15 MG TABLET: 15 mg | ORAL | Qty: 2

## 2015-05-17 MED FILL — AMLODIPINE 5 MG TAB: 5 mg | ORAL | Qty: 2

## 2015-05-17 NOTE — Behavioral Health Treatment Team (Cosign Needed)
Patient has been visible on the unit for periods of time,decrease isolated to his room and only interacting with selected peers.When writer was talking with patient he has minimal eye contact,slow to process.Encouraged patient to verbalize his feelings,he voiced no new issues or concerns.At the present time behavior is appropriate,no acting out,aggressive or disruptive behavior noted.Staff will monitor patient for changes in his behavior and condition.Complaint with med's,meals and remains on Q 15 minnute checks for safety.

## 2015-05-17 NOTE — Behavioral Health Treatment Team (Cosign Needed)
GROUP THERAPY PROGRESS NOTE    The patient Corey Crawford a 69 y.o. male is participating in [employer].     Group time: 1 hour    Personal goal for participation: To concentrate on selected task    Goal orientation: social    Group therapy participation: active    Therapeutic interventions reviewed and discussed: Crafts, games, music    Impression of participation: The patient was attentive.    BEVERLY S BAKER  05/17/2015  5:00 PM

## 2015-05-17 NOTE — Behavioral Health Treatment Team (Signed)
Patient has been resting in their room with their eyes closed and has showed no signs of distress through out the shift. Patient slept for 7 hours. Patient is on every 15 minute checks for safety.

## 2015-05-17 NOTE — Behavioral Health Treatment Team (Cosign Needed)
Patient did not participate.

## 2015-05-17 NOTE — Behavioral Health Treatment Team (Signed)
PSYCHIATRIC PROGRESS NOTE         Patient Name  Corey Crawford   Date of Birth 02/03/1946   CSN [id number]   Medical Record Number  [id number]      Age  69 y.o.   PCP Phys Other, MD   Admit date:  05/04/2015    Room Number  324/01  @ [facility]   Date of Service  05/17/2015          PSYCHOTHERAPY SESSION NOTE:  Length of psychotherapy session: 20 minutes    Main condition/diagnosis/issues treated during session today, 05/17/2015 :  paranoia, denial, mania, tx compliance    I employed  supportive psychotherapy in regards to various ongoing psychosocial stressors, including the following: pre-admission and current problems; housing issues; medical issues; and stress of hospitalization. Interpersonal relationship issues and psychodynamic conflicts explored.  Attempts made to alleviate maladaptive patterns. We, also, worked on issues of denial & effects of substance dependency/use     Overall, patient is regressing today    Treatment Plan Update (reviewed an updated 05/17/2015) :  I will modify psychotherapy tx plan by implementing more stress management strategies, building upon cognitive behavioral techniques, increasing coping skills, as well as shoring up psychological defenses).    An extended energy and skill set was needed to engage pt in psychotherapy due to some of the following: resistiveness, complexity, negativity, confrontational nature, hostile behaviors, and/or severe abnormalities in thought processes/psychosis resulting in the loss of expressive/receptive language communication skills.                            E & M PROGRESS NOTE:         HISTORY       CC:  "I want to go home, been here too long"  HISTORY OF PRESENT ILLNESS/INTERVAL HISTORY:  (reviewed/updated 05/17/2015).  In summary, Corey Crawford, is a 69 y.o.  male who presents with a severe exacerbation of the principal diagnosis, Bipolar disorder with psychotic features (HCC), which is improving and not stable. Patient requires  continued inpatient hospitalization for further stabilization, safety monitoring and medication management.  I will continue to coordinate the provision of individual, milieu, occupational, group, and substance abuse therapies to address target symptoms/diagnoses as deemed appropriate for the individual patient.  A coordinated, multidisplinary treatment team round was conducted with the patient (this team consists of the nurse, psychiatric unit pharmcist, Administrator).     per initial evaluation: The patient, Corey Crawford, is a 69 y.o.?? WHITE OR CAUCASIAN male with a past psychiatric history significant for chronic mental illness , who presents at this time for a severe exacerbation of the principle diagnosis of Bipolar disorder with psychotic features (HCC). Patient reports/evidences the following emotional symptoms:?? agitation, mania and psychosis.?? Additional symptomatology include agitation, anger outbursts, difficulty sleeping, increased irritability and problem with medication.?? The above symptoms have been present for . These symptoms are of severe in?? severity per patient's report. The symptoms are constant?? fleeting in nature.?? The patient's condition has been precipitated by and psychosocial stressors( multiple losses,death in family,diagnosed with colon cancer ).?? Patient's condition made worse by treatment noncompliance. UDS negative, BAL=0. ??  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     Corey Crawford presents/reports/evidences the following emotional symptoms today, 05/17/2015:  Hypomania.  The above symptoms have been present for few months, was at a different hosp for 2 weeks just prior to this admission. These symptoms are of severe severity. The symptoms are constant in nature. Additional symptomatology include decreased delusions,  disorganized thought processes, decreased grandiose, improved sleep, and irritability.      SIDE EFFECTS: (reviewed/updated 05/17/2015)  None reported or admitted to.  No noted toxicity with use of lithium or depakote   ALLERGIES:(reviewed/updated 05/17/2015)  No Known Allergies   MEDICATIONS PRIOR TO ADMISSION:(reviewed/updated 05/17/2015)  Prescriptions prior to admission   Medication Sig   ??? lithium carbonate 600 mg capsule Take 600 mg by mouth nightly. Indications: BIPOLAR DISORDER   ??? atenolol (TENORMIN) 25 mg tablet Take 25 mg by mouth daily. Indications: HYPERTENSION   ??? amLODIPine (NORVASC) 10 mg tablet Take 10 mg by mouth daily. Indications: HYPERTENSION   ??? haloperidol (HALDOL) 2 mg tablet Take 2 mg by mouth nightly. Indications: BIIPOLAR DISORDER ??? diphenhydrAMINE (BENADRYL) 25 mg capsule Take 25 mg by mouth nightly.      PAST MEDICAL HISTORY: Past medical history from the initial psychiatric evaluation has been reviewed (reviewed/updated 05/17/2015) with no additional updates (I asked patient and no additional past medical history provided). No past medical history on file.No past surgical history on file.   SOCIAL HISTORY: Social history from the initial psychiatric evaluation has been reviewed (reviewed/updated 05/17/2015) with no additional updates (I asked patient and no additional social history provided).   History     Social History   ??? Marital Status: LEGALLY SEPARATED     Spouse Name: N/A   ??? Number of Children: N/A   ??? Years of Education: N/A     Occupational History   ??? Not on file.     Social History Main Topics   ??? Smoking status: Not on file    ??? Smokeless tobacco: Not on file   ??? Alcohol Use: Not on file   ??? Drug Use: Not on file   ??? Sexual Activity: Not on file     Other Topics Concern   ??? Not on file     Social History Narrative   ??? No narrative on file    FAMILY HISTORY: Family history from the initial psychiatric evaluation has been reviewed (reviewed/updated 05/17/2015) with no additional updates (I asked patient and no additional family history provided).   Family History   Problem Relation Age of Onset   ??? Bipolar Disorder Father      committed suicide       REVIEW OF SYSTEMS: (reviewed/updated 05/17/2015)  Appetite:good   Sleep: good   All other Review of Systems:   negative for - suicidal ideation  Respiratory ROS: no cough, shortness of breath, or wheezing  Cardiovascular ROS: no chest pain or dyspnea on exertion         MENTAL STATUS EXAM & VITALS     MENTAL STATUS EXAM (MSE):    MSE FINDINGS ARE WITHIN NORMAL LIMITS (WNL) UNLESS OTHERWISE STATED BELOW. ( ALL OF THE BELOW CATEGORIES OF THE MSE HAVE BEEN REVIEWED (reviewed 05/17/2015) AND UPDATED AS DEEMED APPROPRIATE )  General Presentation age appropriate, casually dressed and older than  stated age, unreliable and vague, very poor eye contact   Orientation oriented to time, place and person   Vital Signs  See below (reviewed 05/17/2015); Vital Signs (BP, Pulse, & Temp) are within normal limits if not listed below.   Gait and Station Stable/steady, no ataxia   Musculoskeletal System No extrapyramidal symptoms (EPS); no abnormal muscular movements or Tardive Dyskinesia (TD); muscle strength and tone are within normal limits   Language No aphasia or dysarthria   Speech:  Soft, under productive   Thought Processes logical, wnl rate of thoughts, fair abstract reasoning and computation   Thought Associations Goal directed  , mildly disorganized   Thought Content Delusions denied, no hallucinations   Suicidal Ideations none   Homicidal Ideations none   Mood:  euthymic, labile    Affect:  Euthymic, irritable, odd demeanor, poor eye contact, labile   Memory recent  adequate   Memory remote:  adequate   Concentration/Attention:  impaired   Fund of Knowledge average   Insight:  limited   Reliability poor   Judgment:  limited          VITALS:     Patient Vitals for the past 24 hrs:   Temp Pulse Resp BP SpO2   05/17/15 0655 98.1 ??F (36.7 ??C) (!) 55 16 144/70 mmHg -   05/16/15 1729 97.9 ??F (36.6 ??C) (!) 55 18 127/70 mmHg 98 %     Wt Readings from Last 3 Encounters:   05/13/15 95.255 kg (210 lb)     Temp Readings from Last 3 Encounters:   05/17/15 98.1 ??F (36.7 ??C)      BP Readings from Last 3 Encounters:   05/17/15 144/70     Pulse Readings from Last 3 Encounters:   05/17/15 55            DATA     LABORATORY DATA:(reviewed/updated 05/17/2015)  Recent Results (from the past 24 hour(s))   VALPROIC ACID    Collection Time: 05/17/15  6:09 AM   Result Value Ref Range    Valproic acid 73 50 - 100 ug/ml LITHIUM    Collection Time: 05/17/15  6:09 AM   Result Value Ref Range    Lithium 0.73 0.60 - 1.20 MMOL/L    Reported dose date: UNKNOWN      Reported dose time: UNKNOWN      Reported dose: UNKNOWN UNITS     Lab Results   Component Value Date/Time    VALPROIC ACID 73 05/17/2015 06:09 AM     Lab Results   Component Value Date/Time    LITHIUM 0.73 05/17/2015 06:09 AM      RADIOLOGY REPORTS:(reviewed/updated 05/17/2015)  No results found.       MEDICATIONS     ALL MEDICATIONS:   Current Facility-Administered Medications   Medication Dose Route Frequency   ??? divalproex ER (DEPAKOTE ER) 24 hour tablet 2,000 mg  2,000 mg Oral QHS   ??? ARIPiprazole (ABILIFY) tablet 30 mg  30 mg Oral DAILY   ??? lithium carbonate capsule 900 mg  900 mg Oral QHS   ??? atenolol (TENORMIN) tablet 25 mg  25 mg Oral DAILY   ??? amLODIPine (NORVASC) tablet 10 mg  10 mg Oral DAILY   ??? benztropine (COGENTIN) tablet 1 mg  1 mg Oral BID PRN   ??? benztropine (COGENTIN) injection 1 mg  1 mg IntraMUSCular Q12H PRN    ??? LORazepam (ATIVAN) injection 0.5 mg  0.5 mg IntraMUSCular Q4H PRN   ???  LORazepam (ATIVAN) tablet 0.5 mg  0.5 mg Oral Q4H PRN   ??? zolpidem (AMBIEN) tablet 5 mg  5 mg Oral QHS PRN   ??? acetaminophen (TYLENOL) tablet 650 mg  650 mg Oral Q4H PRN   ??? magnesium hydroxide (MILK OF MAGNESIA) oral suspension 30 mL  30 mL Oral DAILY PRN   ??? nicotine (NICODERM CQ) 21 mg/24 hr patch 1 Patch  1 Patch TransDERmal DAILY PRN      SCHEDULED MEDICATIONS:   Current Facility-Administered Medications   Medication Dose Route Frequency   ??? divalproex ER (DEPAKOTE ER) 24 hour tablet 2,000 mg  2,000 mg Oral QHS   ??? ARIPiprazole (ABILIFY) tablet 30 mg  30 mg Oral DAILY   ??? lithium carbonate capsule 900 mg  900 mg Oral QHS   ??? atenolol (TENORMIN) tablet 25 mg  25 mg Oral DAILY   ??? amLODIPine (NORVASC) tablet 10 mg  10 mg Oral DAILY          ASSESSMENT & PLAN     DIAGNOSES REQUIRING ACTIVE TREATMENT AND MONITORING: (reviewed/updated 05/17/2015)  Patient Active Hospital Problem List:     Bipolar disorder with psychotic features (HCC) (05/05/2015)    Assessment: much less psychotic and manic over the last few days until today, seems like he's regressing sl.   Plan: I will continue to adjust psych meds.   I will continue to monitor blood levels (depakote and lithium---drugs with a narrow therapeutic index= NTI) and associated labs for drug therapy implemented that require intense monitoring for toxicity as deemed appropriate base on current medication side effects and pharmacodynamically determined drug 1/2 lives. Dep and lithium levels wnl today.      Coarse tremors (05/05/2015)    Assessment: stable, improved    Plan: continue to mnoitor     Hypertension (05/05/2015)    Assessment: stable    Plan: monitor. Cont to adjust bp meds            Complete current electronic health record for patient has been reviewed today including consultant notes, ancillary staff notes, nurses and psychiatric tech notes.       The following regarding medications was addressed during rounds with patient:   the risks and benefits of the proposed medication. The patient was given the opportunity to ask questions. Informed consent given to the use of the above medications. Will continue to adjust psychiatric and non-psychiatric medications (see above "medication" section and orders section for details) as deemed appropriate & based upon diagnoses and response to treatment.     I will continue to order blood tests/labs and diagnostic tests as deemed appropriate and review results as they become available (see orders for details and above listed lab/test results).    I will order psychiatric records from previous psych hospitals to further elucidate the nature of patient's psychopathology and review once available.    I will gather additional collateral information from friends, family and o/p treatment team to further elucidate the nature of patient's psychopathology and baselline level of psychiatric functioning.         I certify that this patient's inpatient psychiatric hospital services furnished since the previous certification were, and continue to be, required for treatment that could reasonably be expected to improve the patient's condition, or for diagnostic study, and that the patient continues to need, on a daily basis, active treatment furnished directly by or requiring the supervision of inpatient psychiatric facility personnel. In addition, the hospital records show that services furnished were  intensive treatment services, admission or related services, or equivalent services.    EXPECTED DISCHARGE DATE/DAY: Monday?      DISPOSITION: Home  Psych FU with in [place] ?       Signed By:   Mackie Pai, MD  05/17/2015

## 2015-05-17 NOTE — Behavioral Health Treatment Team (Signed)
Patient has presented in a more stable mood the past few days and had good levels of his lithium and Depakote.  The patient's projected discharge is Monday and has been told this the last 3 days. However, he expressed disappointment and slight agitation with this news.  The patient will follow up with a mental health provider in [place], [place] and his sister will transport him home.     Montel Culver, LCSW

## 2015-05-17 NOTE — Behavioral Health Treatment Team (Cosign Needed)
GROUP THERAPY PROGRESS NOTE    Corey Crawford is participating in [place].     Group time: 30 minutes    Personal goal for participation: daily orientation    Goal orientation: personal    Group therapy participation: minimal    Therapeutic interventions reviewed and discussed: yes    Impression of participation: needed pronpting

## 2015-05-17 NOTE — Progress Notes (Signed)
Problem: Altered Thought Process (Adult/Pediatric)  Goal: *STG: Demonstrates ability to understand and use improved judgment in daily activities and relationships  Outcome: Progressing Towards Goal  Received him sleeping in his room He refused to attend the phone call. He was compliant with his medications. He appeared irritable and uninterested in conversating with the staff.Q67min checks maintained. No insight into his illness.

## 2015-05-17 NOTE — Progress Notes (Signed)
Problem: Altered Thought Process (Adult/Pediatric)  Goal: *STG: Absence of lethality  Outcome: Progressing Towards Goal  Patient resting in bed with eyes closed. He ate dinner and returned back to bed. Very little interaction noted with others on the unit. Patient aware of medication times. Will continue to monitor and assist as needed.

## 2015-05-17 NOTE — Progress Notes (Signed)
Laboratory monitoring for mood stabilizer and antipsychotics:    Lithium = 0.73 mmol/L and Valproic acid = 73 mcg/ml. Doses will remain the same.       The patient is currently taking the following medication(s):   Current Facility-Administered Medications   Medication Dose Route Frequency   ??? ARIPiprazole (ABILIFY) tablet 30 mg  30 mg Oral DAILY   ??? clonazePAM (KlonoPIN) tablet 0.25 mg  0.25 mg Oral BID   ??? lithium carbonate capsule 900 mg  900 mg Oral QHS   ??? divalproex ER (DEPAKOTE ER) 24 hour tablet 1,750 mg  1,750 mg Oral QHS   ??? atenolol (TENORMIN) tablet 25 mg  25 mg Oral DAILY   ??? amLODIPine (NORVASC) tablet 10 mg  10 mg Oral DAILY       Serum Drug Level(s)    LITHIUM  Recent Labs      05/17/15   0609   LI  0.73       VALPROIC ACID  Recent Labs      05/17/15   0609   VALP  73         Renal Function, Hepatic Function and Chemistry  Estimated Creatinine Clearance: 115 mL/min (based on Cr of 0.7).    Lab Results   Component Value Date/Time    SODIUM 141 05/05/2015 06:00 AM    POTASSIUM 3.5 05/05/2015 06:00 AM    CHLORIDE 106 05/05/2015 06:00 AM    CO2 27 05/05/2015 06:00 AM    ANION GAP 8 05/05/2015 06:00 AM    GLUCOSE 130 05/05/2015 06:00 AM    BUN 8 05/05/2015 06:00 AM    CREATININE 0.70 05/05/2015 06:00 AM    BUN/CREATININE RATIO 11 05/05/2015 06:00 AM    GFR EST AA >60 05/05/2015 06:00 AM    GFR EST NON-AA >60 05/05/2015 06:00 AM    CALCIUM 8.3 05/05/2015 06:00 AM    ALT 44 05/05/2015 06:00 AM    AST 32 05/05/2015 06:00 AM    ALK. PHOSPHATASE 78 05/05/2015 06:00 AM    PROTEIN, TOTAL 6.6 05/05/2015 06:00 AM    ALBUMIN 3.2 05/05/2015 06:00 AM    GLOBULIN 3.4 05/05/2015 06:00 AM    A-G RATIO 0.9 05/05/2015 06:00 AM    BILIRUBIN, TOTAL 0.5 05/05/2015 06:00 AM       Lab Results   Component Value Date/Time    GLUCOSE 130 05/05/2015 06:00 AM       Lipids  Lab Results   Component Value Date/Time    CHOLESTEROL, TOTAL 157 05/05/2015 06:00 AM    HDL CHOLESTEROL 41 05/05/2015 06:00 AM     LDL, CALCULATED 102.8 05/05/2015 06:00 AM    TRIGLYCERIDE 66 05/05/2015 06:00 AM    CHOL/HDL RATIO 3.8 05/05/2015 06:00 AM       Thyroid Function    Lab Results   Component Value Date/Time    TSH 0.48 05/05/2015 06:00 AM       Vitals  Visit Vitals   Item Reading   ??? BP 144/70 mmHg   ??? Pulse 55   ??? Temp 98.1 ??F (36.7 ??C)   ??? Resp 16   ??? Ht 175.3 cm (69")   ??? Wt 95.255 kg (210 lb)   ??? BMI 31.00 kg/m2   ??? SpO2 98%         Donell Beers, PharmD, BCPS  [phone] (pharmacy)

## 2015-05-18 MED FILL — AMLODIPINE 5 MG TAB: 5 mg | ORAL | Qty: 2

## 2015-05-18 MED FILL — DEPAKOTE ER 500 MG TABLET,EXTENDED RELEASE: 500 mg | ORAL | Qty: 4

## 2015-05-18 MED FILL — ABILIFY 15 MG TABLET: 15 mg | ORAL | Qty: 2

## 2015-05-18 MED FILL — ATENOLOL 25 MG TAB: 25 mg | ORAL | Qty: 1

## 2015-05-18 MED FILL — LITHIUM CARBONATE 300 MG CAP: 300 mg | ORAL | Qty: 3

## 2015-05-18 NOTE — Behavioral Health Treatment Team (Signed)
GROUP THERAPY PROGRESS NOTE    Corey Crawford is participating in Social Work Process Group. .     Group time: 45 minutes    Personal goal for participation: To gain personal insight into recovery and to learn coping skills.     Goal orientation: personal    Group therapy participation: active    Therapeutic interventions reviewed and discussed: Self-Reflection.     Impression of participation:  Was able to share insight with others.    S. Rysinski, LCSW

## 2015-05-18 NOTE — Behavioral Health Treatment Team (Cosign Needed)
GROUP THERAPY PROGRESS NOTE    Corey Crawford is participating in [place].     Group time: 30 minutes    Personal goal for participation: to review daily goal    Goal orientation: personal    Group therapy participation: minimal    Therapeutic interventions reviewed and discussed: yes    Impression of participation: needec prompting

## 2015-05-18 NOTE — Behavioral Health Treatment Team (Signed)
PSYCHIATRIC PROGRESS NOTE         Patient Name  Corey Crawford   Date of Birth 09-27-1946   CSN [id number]   Medical Record Number  [id number]      Age  69 y.o.   PCP Phys Other, MD   Admit date:  05/04/2015    Room Number  324/01  @ [facility]   Date of Service  05/18/2015          PSYCHOTHERAPY SESSION NOTE:  Length of psychotherapy session: 20 minutes    Main condition/diagnosis/issues treated during session today, 05/18/2015 :  paranoia, denial, mania, tx compliance    I employed  supportive psychotherapy in regards to various ongoing psychosocial stressors, including the following: pre-admission and current problems; housing issues; medical issues; and stress of hospitalization. Interpersonal relationship issues and psychodynamic conflicts explored.  Attempts made to alleviate maladaptive patterns. We, also, worked on issues of denial & effects of substance dependency/use     Overall, patient is regressing today    Treatment Plan Update (reviewed an updated 05/18/2015) :  I will modify psychotherapy tx plan by implementing more stress management strategies, building upon cognitive behavioral techniques, increasing coping skills, as well as shoring up psychological defenses).    An extended energy and skill set was needed to engage pt in psychotherapy due to some of the following: resistiveness, complexity, negativity, confrontational nature, hostile behaviors, and/or severe abnormalities in thought processes/psychosis resulting in the loss of expressive/receptive language communication skills.                            E & M PROGRESS NOTE:         HISTORY       CC:  "I want to go home, been here too long"  HISTORY OF PRESENT ILLNESS/INTERVAL HISTORY:  (reviewed/updated 05/18/2015).  In summary, Corey Crawford, is a 69 y.o.  male who presents with a severe exacerbation of the principal diagnosis, Bipolar disorder with psychotic features (HCC), which is improving and not stable. Patient requires  continued inpatient hospitalization for further stabilization, safety monitoring and medication management.  I will continue to coordinate the provision of individual, milieu, occupational, group, and substance abuse therapies to address target symptoms/diagnoses as deemed appropriate for the individual patient.  A coordinated, multidisplinary treatment team round was conducted with the patient (this team consists of the nurse, psychiatric unit pharmcist, Administrator).     per initial evaluation: The patient, Corey Crawford, is a 69 y.o.?? WHITE OR CAUCASIAN male with a past psychiatric history significant for chronic mental illness , who presents at this time for a severe exacerbation of the principle diagnosis of Bipolar disorder with psychotic features (HCC). Patient reports/evidences the following emotional symptoms:?? agitation, mania and psychosis.?? Additional symptomatology include agitation, anger outbursts, difficulty sleeping, increased irritability and problem with medication.?? The above symptoms have been present for . These symptoms are of severe in?? severity per patient's report. The symptoms are constant?? fleeting in nature.?? The patient's condition has been precipitated by and psychosocial stressors( multiple losses,death in family,diagnosed with colon cancer ).?? Patient's condition made worse by treatment noncompliance. UDS negative, BAL=0. ??  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     Corey Crawford presents/reports/evidences the following emotional symptoms today, 05/18/2015:  Irritable.   The above symptoms have been present for few months, was at a different hosp for 2 weeks just prior to this admission. These symptoms are of severe severity. The symptoms are constant in nature. Additional symptomatology include decreased delusions,  disorganized thought processes, decreased grandiose, improved sleep, and irritability.Unhappy to be in the hospital. Wants to go back to [place].      SIDE EFFECTS: (reviewed/updated 05/18/2015)  None reported or admitted to.  No noted toxicity with use of lithium or depakote   ALLERGIES:(reviewed/updated 05/18/2015)  No Known Allergies   MEDICATIONS PRIOR TO ADMISSION:(reviewed/updated 05/18/2015)  Prescriptions prior to admission   Medication Sig   ??? lithium carbonate 600 mg capsule Take 600 mg by mouth nightly. Indications: BIPOLAR DISORDER   ??? atenolol (TENORMIN) 25 mg tablet Take 25 mg by mouth daily. Indications: HYPERTENSION   ??? amLODIPine (NORVASC) 10 mg tablet Take 10 mg by mouth daily. Indications: HYPERTENSION   ??? haloperidol (HALDOL) 2 mg tablet Take 2 mg by mouth nightly. Indications: BIIPOLAR DISORDER   ??? diphenhydrAMINE (BENADRYL) 25 mg capsule Take 25 mg by mouth nightly.      PAST MEDICAL HISTORY: Past medical history from the initial psychiatric evaluation has been reviewed (reviewed/updated 05/18/2015) with no additional updates (I asked patient and no additional past medical history provided). No past medical history on file.No past surgical history on file.   SOCIAL HISTORY: Social history from the initial psychiatric evaluation has been reviewed (reviewed/updated 05/18/2015) with no additional updates (I asked patient and no additional social history provided).   History     Social History   ??? Marital Status: LEGALLY SEPARATED     Spouse Name: N/A   ??? Number of Children: N/A   ??? Years of Education: N/A     Occupational History    ??? Not on file.     Social History Main Topics   ??? Smoking status: Not on file   ??? Smokeless tobacco: Not on file   ??? Alcohol Use: Not on file   ??? Drug Use: Not on file   ??? Sexual Activity: Not on file     Other Topics Concern   ??? Not on file     Social History Narrative   ??? No narrative on file      FAMILY HISTORY: Family history from the initial psychiatric evaluation has been reviewed (reviewed/updated 05/18/2015) with no additional updates (I asked patient and no additional family history provided).   Family History   Problem Relation Age of Onset   ??? Bipolar Disorder Father      committed suicide       REVIEW OF SYSTEMS: (reviewed/updated 05/18/2015)  Appetite:good   Sleep: good   All other Review of Systems:   negative for - suicidal ideation  Respiratory ROS: no cough, shortness of breath, or wheezing  Cardiovascular ROS: no chest pain or dyspnea on exertion         MENTAL STATUS EXAM & VITALS     MENTAL STATUS EXAM (MSE):    MSE FINDINGS ARE WITHIN NORMAL LIMITS (WNL) UNLESS OTHERWISE STATED BELOW. ( ALL OF THE BELOW CATEGORIES OF THE MSE HAVE BEEN REVIEWED (reviewed  05/18/2015) AND UPDATED AS DEEMED APPROPRIATE )  General Presentation age appropriate, casually dressed and older than stated age, unreliable and vague, very poor eye contact   Orientation oriented to time, place and person   Vital Signs  See below (reviewed 05/18/2015); Vital Signs (BP, Pulse, & Temp) are within normal limits if not listed below.   Gait and Station Stable/steady, no ataxia   Musculoskeletal System No extrapyramidal symptoms (EPS); no abnormal muscular movements or Tardive Dyskinesia (TD); muscle strength and tone are within normal limits   Language No aphasia or dysarthria   Speech:  Soft, under productive   Thought Processes logical, wnl rate of thoughts, fair abstract reasoning and computation   Thought Associations Goal directed  , mildly disorganized   Thought Content Delusions denied, no hallucinations    Suicidal Ideations none   Homicidal Ideations none   Mood:  euthymic, labile   Affect:  Euthymic, irritable, odd demeanor, poor eye contact, labile   Memory recent  adequate   Memory remote:  adequate   Concentration/Attention:  impaired   Fund of Knowledge average   Insight:  limited   Reliability poor   Judgment:  limited          VITALS:     Patient Vitals for the past 24 hrs:   Temp Pulse Resp BP   05/18/15 0556 97.2 ??F (36.2 ??C) (!) 57 18 143/76 mmHg     Wt Readings from Last 3 Encounters:   05/13/15 95.255 kg (210 lb)     Temp Readings from Last 3 Encounters:   05/18/15 97.2 ??F (36.2 ??C)      BP Readings from Last 3 Encounters:   05/18/15 143/76     Pulse Readings from Last 3 Encounters:   05/18/15 57            DATA     LABORATORY DATA:(reviewed/updated 05/18/2015)  No results found for this or any previous visit (from the past 24 hour(s)).  Lab Results   Component Value Date/Time    VALPROIC ACID 73 05/17/2015 06:09 AM     Lab Results   Component Value Date/Time    LITHIUM 0.73 05/17/2015 06:09 AM      RADIOLOGY REPORTS:(reviewed/updated 05/18/2015)  No results found.       MEDICATIONS     ALL MEDICATIONS:   Current Facility-Administered Medications   Medication Dose Route Frequency   ??? divalproex ER (DEPAKOTE ER) 24 hour tablet 2,000 mg  2,000 mg Oral QHS   ??? ARIPiprazole (ABILIFY) tablet 30 mg  30 mg Oral DAILY   ??? lithium carbonate capsule 900 mg  900 mg Oral QHS   ??? atenolol (TENORMIN) tablet 25 mg  25 mg Oral DAILY   ??? amLODIPine (NORVASC) tablet 10 mg  10 mg Oral DAILY   ??? benztropine (COGENTIN) tablet 1 mg  1 mg Oral BID PRN   ??? benztropine (COGENTIN) injection 1 mg  1 mg IntraMUSCular Q12H PRN   ??? LORazepam (ATIVAN) injection 0.5 mg  0.5 mg IntraMUSCular Q4H PRN   ??? LORazepam (ATIVAN) tablet 0.5 mg  0.5 mg Oral Q4H PRN   ??? zolpidem (AMBIEN) tablet 5 mg  5 mg Oral QHS PRN   ??? acetaminophen (TYLENOL) tablet 650 mg  650 mg Oral Q4H PRN    ??? magnesium hydroxide (MILK OF MAGNESIA) oral suspension 30 mL  30 mL Oral DAILY PRN   ??? nicotine (NICODERM CQ) 21 mg/24 hr patch 1 Patch  1 Patch TransDERmal DAILY  PRN      SCHEDULED MEDICATIONS:   Current Facility-Administered Medications   Medication Dose Route Frequency   ??? divalproex ER (DEPAKOTE ER) 24 hour tablet 2,000 mg  2,000 mg Oral QHS   ??? ARIPiprazole (ABILIFY) tablet 30 mg  30 mg Oral DAILY   ??? lithium carbonate capsule 900 mg  900 mg Oral QHS   ??? atenolol (TENORMIN) tablet 25 mg  25 mg Oral DAILY   ??? amLODIPine (NORVASC) tablet 10 mg  10 mg Oral DAILY          ASSESSMENT & PLAN     DIAGNOSES REQUIRING ACTIVE TREATMENT AND MONITORING: (reviewed/updated 05/18/2015)  Patient Active Hospital Problem List:     Bipolar disorder with psychotic features (HCC) (05/05/2015)    Assessment: much less psychotic and manic over the last few days until today, seems like he's regressing sl.   Plan: I will continue to adjust psych meds.   I will continue to monitor blood levels (depakote and lithium---drugs with a narrow therapeutic index= NTI) and associated labs for drug therapy implemented that require intense monitoring for toxicity as deemed appropriate base on current medication side effects and pharmacodynamically determined drug 1/2 lives. Dep and lithium levels wnl today.      Coarse tremors (05/05/2015)    Assessment: stable, improved    Plan: continue to mnoitor     Hypertension (05/05/2015)    Assessment: stable    Plan: monitor. Cont to adjust bp meds            Complete current electronic health record for patient has been reviewed today including consultant notes, ancillary staff notes, nurses and psychiatric tech notes.      The following regarding medications was addressed during rounds with patient:   the risks and benefits of the proposed medication. The patient was given the opportunity to ask questions. Informed consent given to the use of the  above medications. Will continue to adjust psychiatric and non-psychiatric medications (see above "medication" section and orders section for details) as deemed appropriate & based upon diagnoses and response to treatment.     I will continue to order blood tests/labs and diagnostic tests as deemed appropriate and review results as they become available (see orders for details and above listed lab/test results).    I will order psychiatric records from previous psych hospitals to further elucidate the nature of patient's psychopathology and review once available.    I will gather additional collateral information from friends, family and o/p treatment team to further elucidate the nature of patient's psychopathology and baselline level of psychiatric functioning.         I certify that this patient's inpatient psychiatric hospital services furnished since the previous certification were, and continue to be, required for treatment that could reasonably be expected to improve the patient's condition, or for diagnostic study, and that the patient continues to need, on a daily basis, active treatment furnished directly by or requiring the supervision of inpatient psychiatric facility personnel. In addition, the hospital records show that services furnished were intensive treatment services, admission or related services, or equivalent services.    EXPECTED DISCHARGE DATE/DAY: Monday?      DISPOSITION: Home  Psych FU with in [place] ?       Signed By:   Elby Showers, MD  05/18/2015

## 2015-05-18 NOTE — Behavioral Health Treatment Team (Signed)
The patient came out for medication and he ate his breakfast.The patient is isolative and does not particularly like to interact with his peers.The patient has been and continues to be encouraged to interact more with his peers and attend the groups for their help in coping skills and to be able to verbalize.Assess for safety and needs Q 15 minutes.

## 2015-05-18 NOTE — Behavioral Health Treatment Team (Signed)
GROUP THERAPY PROGRESS NOTE    Corey Crawford is participating in [place].     Group time: 30 minutes    Personal goal for participation: daily orientation    Goal orientation: personal    Group therapy participation: minimal    Therapeutic interventions reviewed and discussed: yes    Impression of participation: sat quietly

## 2015-05-18 NOTE — Behavioral Health Treatment Team (Addendum)
Observed to be sleeping throughout without any distress and with even respirations. Woke up in between for drinking water. Slept for 6.5 hours. When he woke up his affect was irritable. He said that he has been here too lojng and wanted to go home. When asked about how the round went yesterday, he evaded the topic and said he wanted the [place]istani dr and did not want the other doctor. He started calling him names and balled up a fist and said he was to hit the doctor, he said all substance abusers are let go and wondered why he was still here. He was paranoid and had poor insight and judgment.She said he had not slept well.

## 2015-05-19 MED FILL — ATENOLOL 25 MG TAB: 25 mg | ORAL | Qty: 1

## 2015-05-19 MED FILL — LITHIUM CARBONATE 300 MG CAP: 300 mg | ORAL | Qty: 3

## 2015-05-19 MED FILL — AMLODIPINE 5 MG TAB: 5 mg | ORAL | Qty: 2

## 2015-05-19 MED FILL — ABILIFY 15 MG TABLET: 15 mg | ORAL | Qty: 2

## 2015-05-19 MED FILL — DEPAKOTE ER 500 MG TABLET,EXTENDED RELEASE: 500 mg | ORAL | Qty: 4

## 2015-05-19 NOTE — Behavioral Health Treatment Team (Signed)
The patient continues with isolative behaviors and he need much encouragement to come out to attend group.He has a guarded mood.The patient does not look you directly in the face when you are talking to him.Even when the patient is out he is very quiet.Q 15 minute safety rounds for needs and any changes in behavior.

## 2015-05-19 NOTE — Progress Notes (Signed)
Problem: Altered Thought Process (Adult/Pediatric)  Goal: Interventions  Outcome: Progressing Towards Goal  Patient remained in his bed the whole shift. He said that this is how the medications were making him fee but did not elaborate on it. Affect and mood was irritable. Q10min checks maintained. He has been compliant to his medications.

## 2015-05-19 NOTE — Behavioral Health Treatment Team (Cosign Needed)
GROUP THERAPY PROGRESS NOTE    Yugan Frantom is participating in [place].     Group time: 30 minutes    Personal goal for participation: daily orientation    Goal orientation: personal    Group therapy participation: minimal    Therapeutic interventions reviewed and discussed: yes    Impression of participation: needed prompting

## 2015-05-19 NOTE — Progress Notes (Signed)
Problem: Altered Thought Process (Adult/Pediatric)  Goal: *STG: Decreased hallucinations  Outcome: Progressing Towards Goal  Pt has been isolated to his room resting quietly most of the time during the shift. Pt was food and med compliant, Pt appropriate with staff and other pts in the mileu. Pt denied any S/I and H/I at this time. Pt encouraged to attend all group activities and to discuss any issues or concerns with staff. Staff will also continue to monitor pt with Q -15 checks for safety.

## 2015-05-19 NOTE — Behavioral Health Treatment Team (Signed)
PSYCHIATRIC PROGRESS NOTE         Patient Name  Corey Crawford   Date of Birth 08/07/1946   CSN [id number]   Medical Record Number  [id number]      Age  69 y.o.   PCP Phys Other, MD   Admit date:  05/04/2015    Room Number  324/01  @ [facility]   Date of Service  05/19/2015          PSYCHOTHERAPY SESSION NOTE:  Length of psychotherapy session: 20 minutes    Main condition/diagnosis/issues treated during session today, 05/19/2015 :  paranoia, denial, mania, tx compliance    I employed  supportive psychotherapy in regards to various ongoing psychosocial stressors, including the following: pre-admission and current problems; housing issues; medical issues; and stress of hospitalization. Interpersonal relationship issues and psychodynamic conflicts explored.  Attempts made to alleviate maladaptive patterns. We, also, worked on issues of denial & effects of substance dependency/use     Overall, patient is regressing today    Treatment Plan Update (reviewed an updated 05/19/2015) :  I will modify psychotherapy tx plan by implementing more stress management strategies, building upon cognitive behavioral techniques, increasing coping skills, as well as shoring up psychological defenses).    An extended energy and skill set was needed to engage pt in psychotherapy due to some of the following: resistiveness, complexity, negativity, confrontational nature, hostile behaviors, and/or severe abnormalities in thought processes/psychosis resulting in the loss of expressive/receptive language communication skills.                            E & M PROGRESS NOTE:         HISTORY       CC:  "I want to go home, been here too long"  HISTORY OF PRESENT ILLNESS/INTERVAL HISTORY:  (reviewed/updated 05/19/2015).  In summary, Corey Crawford, is a 69 y.o.  male who presents with a severe exacerbation of the principal diagnosis, Bipolar disorder with psychotic features (HCC), which is improving and not stable. Patient requires  continued inpatient hospitalization for further stabilization, safety monitoring and medication management.  I will continue to coordinate the provision of individual, milieu, occupational, group, and substance abuse therapies to address target symptoms/diagnoses as deemed appropriate for the individual patient.  A coordinated, multidisplinary treatment team round was conducted with the patient (this team consists of the nurse, psychiatric unit pharmcist, Administrator).     per initial evaluation: The patient, Corey Crawford, is a 70 y.o.?? WHITE OR CAUCASIAN male with a past psychiatric history significant for chronic mental illness , who presents at this time for a severe exacerbation of the principle diagnosis of Bipolar disorder with psychotic features (HCC). Patient reports/evidences the following emotional symptoms:?? agitation, mania and psychosis.?? Additional symptomatology include agitation, anger outbursts, difficulty sleeping, increased irritability and problem with medication.?? The above symptoms have been present for . These symptoms are of severe in?? severity per patient's report. The symptoms are constant?? fleeting in nature.?? The patient's condition has been precipitated by and psychosocial stressors( multiple losses,death in family,diagnosed with colon cancer ).?? Patient's condition made worse by treatment noncompliance. UDS negative, BAL=0. ??  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     Corey Crawford presents/reports/evidences the following emotional symptoms today, 05/19/2015:  Irritable.   The above symptoms have been present for few months, was at a different hosp for 2 weeks just prior to this admission. These symptoms are of severe severity. The symptoms are constant in nature. Additional symptomatology include decreased delusions,  disorganized thought processes, decreased grandiose, improved sleep, and irritability.Unhappy to be in the hospital. Wants to go back to [place]. Li 0.73      SIDE EFFECTS: (reviewed/updated 05/19/2015)  None reported or admitted to.  No noted toxicity with use of lithium or depakote   ALLERGIES:(reviewed/updated 05/19/2015)  No Known Allergies   MEDICATIONS PRIOR TO ADMISSION:(reviewed/updated 05/19/2015)  Prescriptions prior to admission   Medication Sig   ??? lithium carbonate 600 mg capsule Take 600 mg by mouth nightly. Indications: BIPOLAR DISORDER   ??? atenolol (TENORMIN) 25 mg tablet Take 25 mg by mouth daily. Indications: HYPERTENSION   ??? amLODIPine (NORVASC) 10 mg tablet Take 10 mg by mouth daily. Indications: HYPERTENSION   ??? haloperidol (HALDOL) 2 mg tablet Take 2 mg by mouth nightly. Indications: BIIPOLAR DISORDER   ??? diphenhydrAMINE (BENADRYL) 25 mg capsule Take 25 mg by mouth nightly.      PAST MEDICAL HISTORY: Past medical history from the initial psychiatric evaluation has been reviewed (reviewed/updated 05/19/2015) with no additional updates (I asked patient and no additional past medical history provided). No past medical history on file.No past surgical history on file.   SOCIAL HISTORY: Social history from the initial psychiatric evaluation has been reviewed (reviewed/updated 05/19/2015) with no additional updates (I asked patient and no additional social history provided).   History     Social History   ??? Marital Status: LEGALLY SEPARATED     Spouse Name: N/A   ??? Number of Children: N/A   ??? Years of Education: N/A     Occupational History    ??? Not on file.   Social History Main Topics   ??? Smoking status: Not on file   ??? Smokeless tobacco: Not on file   ??? Alcohol Use: Not on file   ??? Drug Use: Not on file   ??? Sexual Activity: Not on file     Other Topics Concern   ??? Not on file     Social History Narrative   ??? No narrative on file      FAMILY HISTORY: Family history from the initial psychiatric evaluation has been reviewed (reviewed/updated 05/19/2015) with no additional updates (I asked patient and no additional family history provided).   Family History   Problem Relation Age of Onset   ??? Bipolar Disorder Father      committed suicide       REVIEW OF SYSTEMS: (reviewed/updated 05/19/2015)  Appetite:good   Sleep: good   All other Review of Systems:   negative for - suicidal ideation  Respiratory ROS: no cough, shortness of breath, or wheezing  Cardiovascular ROS: no chest pain or dyspnea on exertion         MENTAL STATUS EXAM & VITALS     MENTAL STATUS EXAM (MSE):    MSE FINDINGS ARE WITHIN NORMAL LIMITS (WNL) UNLESS OTHERWISE STATED BELOW. ( ALL OF THE BELOW CATEGORIES OF THE MSE HAVE BEEN REVIEWED (reviewed  05/19/2015) AND UPDATED AS DEEMED APPROPRIATE )  General Presentation age appropriate, casually dressed and older than stated age, unreliable and vague, very poor eye contact   Orientation oriented to time, place and person   Vital Signs  See below (reviewed 05/19/2015); Vital Signs (BP, Pulse, & Temp) are within normal limits if not listed below.   Gait and Station Stable/steady, no ataxia   Musculoskeletal System No extrapyramidal symptoms (EPS); no abnormal muscular movements or Tardive Dyskinesia (TD); muscle strength and tone are within normal limits   Language No aphasia or dysarthria   Speech:  Soft, under productive   Thought Processes logical, wnl rate of thoughts, fair abstract reasoning and computation   Thought Associations Goal directed  , mildly disorganized   Thought Content Delusions denied, no hallucinations   Suicidal Ideations none    Homicidal Ideations none   Mood:  euthymic, labile   Affect:  Euthymic, irritable, odd demeanor, poor eye contact, labile   Memory recent  adequate   Memory remote:  adequate   Concentration/Attention:  impaired   Fund of Knowledge average   Insight:  limited   Reliability poor   Judgment:  limited          VITALS:     Patient Vitals for the past 24 hrs:   Temp Pulse Resp BP   05/19/15 0608 96.4 ??F (35.8 ??C) (!) 51 18 139/69 mmHg     Wt Readings from Last 3 Encounters:   05/13/15 95.255 kg (210 lb)     Temp Readings from Last 3 Encounters:   05/19/15 96.4 ??F (35.8 ??C)      BP Readings from Last 3 Encounters:   05/19/15 139/69     Pulse Readings from Last 3 Encounters:   05/19/15 51            DATA     LABORATORY DATA:(reviewed/updated 05/19/2015)  No results found for this or any previous visit (from the past 24 hour(s)).  Lab Results   Component Value Date/Time    VALPROIC ACID 73 05/17/2015 06:09 AM     Lab Results   Component Value Date/Time    LITHIUM 0.73 05/17/2015 06:09 AM      RADIOLOGY REPORTS:(reviewed/updated 05/19/2015)  No results found.       MEDICATIONS     ALL MEDICATIONS:   Current Facility-Administered Medications   Medication Dose Route Frequency   ??? divalproex ER (DEPAKOTE ER) 24 hour tablet 2,000 mg  2,000 mg Oral QHS   ??? ARIPiprazole (ABILIFY) tablet 30 mg  30 mg Oral DAILY   ??? lithium carbonate capsule 900 mg  900 mg Oral QHS   ??? atenolol (TENORMIN) tablet 25 mg  25 mg Oral DAILY   ??? amLODIPine (NORVASC) tablet 10 mg  10 mg Oral DAILY   ??? benztropine (COGENTIN) tablet 1 mg  1 mg Oral BID PRN   ??? benztropine (COGENTIN) injection 1 mg  1 mg IntraMUSCular Q12H PRN   ??? LORazepam (ATIVAN) injection 0.5 mg  0.5 mg IntraMUSCular Q4H PRN   ??? LORazepam (ATIVAN) tablet 0.5 mg  0.5 mg Oral Q4H PRN   ??? zolpidem (AMBIEN) tablet 5 mg  5 mg Oral QHS PRN   ??? acetaminophen (TYLENOL) tablet 650 mg  650 mg Oral Q4H PRN   ??? magnesium hydroxide (MILK OF MAGNESIA) oral suspension 30 mL  30 mL Oral DAILY PRN    ??? nicotine (NICODERM CQ) 21 mg/24 hr patch 1 Patch  1 Patch TransDERmal DAILY  PRN      SCHEDULED MEDICATIONS:   Current Facility-Administered Medications   Medication Dose Route Frequency   ??? divalproex ER (DEPAKOTE ER) 24 hour tablet 2,000 mg  2,000 mg Oral QHS   ??? ARIPiprazole (ABILIFY) tablet 30 mg  30 mg Oral DAILY   ??? lithium carbonate capsule 900 mg  900 mg Oral QHS   ??? atenolol (TENORMIN) tablet 25 mg  25 mg Oral DAILY   ??? amLODIPine (NORVASC) tablet 10 mg  10 mg Oral DAILY          ASSESSMENT & PLAN     DIAGNOSES REQUIRING ACTIVE TREATMENT AND MONITORING: (reviewed/updated 05/19/2015)  Patient Active Hospital Problem List:     Bipolar disorder with psychotic features (HCC) (05/05/2015)    Assessment: much less psychotic and manic over the last few days until today, seems like he's regressing sl.   Plan: I will continue to adjust psych meds.   I will continue to monitor blood levels (depakote and lithium---drugs with a narrow therapeutic index= NTI) and associated labs for drug therapy implemented that require intense monitoring for toxicity as deemed appropriate base on current medication side effects and pharmacodynamically determined drug 1/2 lives. Dep and lithium levels wnl today.      Coarse tremors (05/05/2015)    Assessment: stable, improved    Plan: continue to mnoitor     Hypertension (05/05/2015)    Assessment: stable    Plan: monitor. Cont to adjust bp meds            Complete current electronic health record for patient has been reviewed today including consultant notes, ancillary staff notes, nurses and psychiatric tech notes.      The following regarding medications was addressed during rounds with patient:   the risks and benefits of the proposed medication. The patient was given the opportunity to ask questions. Informed consent given to the use of the above medications. Will continue to adjust psychiatric and non-psychiatric medications (see above "medication" section and orders section for  details) as deemed appropriate & based upon diagnoses and response to treatment.     I will continue to order blood tests/labs and diagnostic tests as deemed appropriate and review results as they become available (see orders for details and above listed lab/test results).    I will order psychiatric records from previous psych hospitals to further elucidate the nature of patient's psychopathology and review once available.    I will gather additional collateral information from friends, family and o/p treatment team to further elucidate the nature of patient's psychopathology and baselline level of psychiatric functioning.         I certify that this patient's inpatient psychiatric hospital services furnished since the previous certification were, and continue to be, required for treatment that could reasonably be expected to improve the patient's condition, or for diagnostic study, and that the patient continues to need, on a daily basis, active treatment furnished directly by or requiring the supervision of inpatient psychiatric facility personnel. In addition, the hospital records show that services furnished were intensive treatment services, admission or related services, or equivalent services.    EXPECTED DISCHARGE DATE/DAY: Monday?      DISPOSITION: Home  Psych FU with in [place] ?       Signed By:   Elby Showers, MD  05/19/2015

## 2015-05-19 NOTE — Behavioral Health Treatment Team (Signed)
GROUP THERAPY PROGRESS NOTE    Corey Crawford is participating in [facility].     Group time: 30 minutes    Personal goal for participation: daily orientation    Goal orientation: personal    Group therapy participation: minimal    Therapeutic interventions reviewed and discussed: yes    Impression of participation: needed prompting

## 2015-05-19 NOTE — Behavioral Health Treatment Team (Signed)
He did not come out for medications inspite of being called 4 times. When the medications were taken to him he said," I thought I took the medications and sleep at 9.30," and he was told the time was 9.30. He was irritable and said that we were making him feel that way and his talk was paranoid. He took his medications grumbling and said, he was glad he was going home tomorrow. He said," I'd rather die that be here." When asked if he was being discharged tomorrow, He replied, " O, I'm going tomorrow dead or standing." He took his medications  Patient was observed to be sleeping for 7 hours. Did not voice any complaints.

## 2015-05-19 NOTE — Behavioral Health Treatment Team (Signed)
Slept for 7 hours without any distress and had even respirations. Q15min checks maintained.

## 2015-05-20 MED FILL — AMLODIPINE 5 MG TAB: 5 mg | ORAL | Qty: 2

## 2015-05-20 MED FILL — ATENOLOL 25 MG TAB: 25 mg | ORAL | Qty: 1

## 2015-05-20 MED FILL — LITHIUM CARBONATE 300 MG CAP: 300 mg | ORAL | Qty: 3

## 2015-05-20 MED FILL — DEPAKOTE ER 500 MG TABLET,EXTENDED RELEASE: 500 mg | ORAL | Qty: 4

## 2015-05-20 MED FILL — ABILIFY 15 MG TABLET: 15 mg | ORAL | Qty: 2

## 2015-05-20 NOTE — Behavioral Health Treatment Team (Signed)
TRANSFER - OUT REPORT:    Verbal report given to Eugenie NorrieJimmy Zhang, RN on [employer]en  being [facility] for change in patient condition(Patient is making threats of possibly harming himself if he is not discharged.)       Report consisted of patient???s Situation, Background, Assessment and   Recommendations(SBAR).     Information from the following report(s) Kardex and MAR was reviewed with the receiving nurse.    Lines:       Opportunity for questions and clarification was provided.      Patient transported with:  Patient was escorted to the acute unit by two staff members.

## 2015-05-20 NOTE — Behavioral Health Treatment Team (Cosign Needed)
GROUP THERAPY PROGRESS NOTE    Corey LennoxMarvin Crawford is participating in [employer].     Group time: 30 minutes    Personal goal for participation: daily orientation    Goal orientation: personal    Group therapy participation: active    Therapeutic interventions reviewed and discussed: yes    Impression of participation: attend    Corey MassedJanice L. Jackson  05/20/2015

## 2015-05-20 NOTE — Progress Notes (Addendum)
Problem: Altered Thought Process (Adult/Pediatric)  Goal: *STG: Absence of lethality  Outcome: Resolved/Met Date Met:  05/20/15  Patient denied SI/HI. Patient was joking with Probation officer. Stated he is feeling better. Affect brighter. Patient smiling this AM. Compliant with medication therapy. Appetite good; ate about 75% of meal. Q15 min checks performed for safety.

## 2015-05-20 NOTE — Behavioral Health Treatment Team (Signed)
PSYCHIATRIC PROGRESS NOTE         Patient Name  Corey Crawford   Date of Birth Nov 02, 1946   CSN [id number]   Medical Record Number  [id number]      Age  69 y.o.   PCP Phys Other, MD   Admit date:  05/04/2015    Room Number  312/02  @ [facility]   Date of Service  05/20/2015          PSYCHOTHERAPY SESSION NOTE:  Length of psychotherapy session: 20 minutes    Main condition/diagnosis/issues treated during session today, 05/20/2015 :  paranoia, denial, mania, tx noncompliance    I employed  supportive psychotherapy in regards to various ongoing psychosocial stressors, including the following: pre-admission and current problems; housing issues; medical issues; and stress of hospitalization. Interpersonal relationship issues and psychodynamic conflicts explored.  Attempts made to alleviate maladaptive patterns. We, also, worked on issues of denial & effects of substance dependency/use     Overall, patient is regressing today    Treatment Plan Update (reviewed an updated 05/20/2015) :  I will modify psychotherapy tx plan by implementing more stress management strategies, building upon cognitive behavioral techniques, increasing coping skills, as well as shoring up psychological defenses).    An extended energy and skill set was needed to engage pt in psychotherapy due to some of the following: resistiveness, complexity, negativity, confrontational nature, hostile behaviors, and/or severe abnormalities in thought processes/psychosis resulting in the loss of expressive/receptive language communication skills.                            E & M PROGRESS NOTE:         HISTORY       CC:  "I need to go today, is my sister on her way"  HISTORY OF PRESENT ILLNESS/INTERVAL HISTORY:  (reviewed/updated 05/20/2015).  In summary, Corey Crawford, is a 69 y.o.  male who presents with a severe exacerbation of the principal diagnosis, Bipolar disorder with psychotic  features (HCC), which is improving and not stable. Patient requires continued inpatient hospitalization for further stabilization, safety monitoring and medication management.  I will continue to coordinate the provision of individual, milieu, occupational, group, and substance abuse therapies to address target symptoms/diagnoses as deemed appropriate for the individual patient.  A coordinated, multidisplinary treatment team round was conducted with the patient (this team consists of the nurse, psychiatric unit pharmcist, Administrator).     per initial evaluation: The patient, Corey Crawford, is a 69 y.o.?? WHITE OR CAUCASIAN male with a past psychiatric history significant for chronic mental illness , who presents at this time for a severe exacerbation of the principle diagnosis of Bipolar disorder with psychotic features (HCC). Patient reports/evidences the following emotional symptoms:?? agitation, mania and psychosis.?? Additional symptomatology include agitation, anger outbursts, difficulty sleeping, increased irritability and problem with medication.?? The above symptoms have been present for . These symptoms are of severe in?? severity per patient's report. The symptoms are constant?? fleeting in nature.?? The patient's condition has been precipitated by and psychosocial stressors( multiple losses,death in family,diagnosed with colon cancer ).?? Patient's condition made worse by treatment noncompliance. UDS negative, BAL=0. ??  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    Corey Crawford presents/reports/evidences the following emotional symptoms today, 05/20/2015:  paranoia, mania.  The above symptoms have been present for few months, was at a different hosp for 2 weeks just prior to this admission.  These sxs were improving the beginning of last week, since has been regressing. These symptoms are of severe severity. The symptoms are constant in nature. Additional symptomatology include disorganized thought processes, decreased grandiose, improved sleep, and irritability.      SIDE EFFECTS: (reviewed/updated 05/20/2015)  None reported or admitted to.  No noted toxicity with use of lithium or depakote   ALLERGIES:(reviewed/updated 05/20/2015)  No Known Allergies   MEDICATIONS PRIOR TO ADMISSION:(reviewed/updated 05/20/2015)  Prescriptions prior to admission   Medication Sig   ??? lithium carbonate 600 mg capsule Take 600 mg by mouth nightly. Indications: BIPOLAR DISORDER   ??? atenolol (TENORMIN) 25 mg tablet Take 25 mg by mouth daily. Indications: HYPERTENSION   ??? amLODIPine (NORVASC) 10 mg tablet Take 10 mg by mouth daily. Indications: HYPERTENSION   ??? haloperidol (HALDOL) 2 mg tablet Take 2 mg by mouth nightly. Indications: BIIPOLAR DISORDER   ??? diphenhydrAMINE (BENADRYL) 25 mg capsule Take 25 mg by mouth nightly.      PAST MEDICAL HISTORY: Past medical history from the initial psychiatric evaluation has been reviewed (reviewed/updated 05/20/2015) with no additional updates (I asked patient and no additional past medical history provided). No past medical history on file.No past surgical history on file.   SOCIAL HISTORY: Social history from the initial psychiatric evaluation has been reviewed (reviewed/updated 05/20/2015) with no additional updates (I asked patient and no additional social history provided).   History     Social History   ??? Marital Status: LEGALLY SEPARATED     Spouse Name: N/A   ??? Number of Children: N/A    ??? Years of Education: N/A     Occupational History   ??? Not on file.     Social History Main Topics   ??? Smoking status: Not on file   ??? Smokeless tobacco: Not on file   ??? Alcohol Use: Not on file   ??? Drug Use: Not on file   ??? Sexual Activity: Not on file     Other Topics Concern   ??? Not on file     Social History Narrative   ??? No narrative on file      FAMILY HISTORY: Family history from the initial psychiatric evaluation has been reviewed (reviewed/updated 05/20/2015) with no additional updates (I asked patient and no additional family history provided).   Family History   Problem Relation Age of Onset   ??? Bipolar Disorder Father      committed suicide       REVIEW OF SYSTEMS: (reviewed/updated 05/20/2015)  Appetite:good   Sleep: good   All other Review of Systems:   negative for - suicidal ideation  Respiratory ROS: no cough, shortness of breath, or wheezing  Cardiovascular ROS: no chest pain or dyspnea on exertion         MENTAL STATUS EXAM & VITALS     MENTAL STATUS EXAM (MSE):    MSE FINDINGS ARE WITHIN NORMAL LIMITS (WNL) UNLESS OTHERWISE STATED BELOW. ( ALL OF THE BELOW CATEGORIES OF THE MSE HAVE BEEN REVIEWED (  reviewed 05/20/2015) AND UPDATED AS DEEMED APPROPRIATE )  General Presentation age appropriate, casually dressed and older than stated age, unreliable and vague, very poor eye contact   Orientation oriented to time, place and person   Vital Signs  See below (reviewed 05/20/2015); Vital Signs (BP, Pulse, & Temp) are within normal limits if not listed below.   Gait and Station Stable/steady, no ataxia   Musculoskeletal System No extrapyramidal symptoms (EPS); no abnormal muscular movements or Tardive Dyskinesia (TD); muscle strength and tone are within normal limits   Language No aphasia or dysarthria   Speech:  Soft, under productive   Thought Processes logical, wnl rate of thoughts, fair abstract reasoning and computation   Thought Associations  disorganized    Thought Content Delusions worse, no hallucinations   Suicidal Ideations none   Homicidal Ideations none   Mood:  irritable, labile   Affect:  irritable, odd demeanor, very poor eye contact even when asked to look at writer during session, labile   Memory recent  adequate   Memory remote:  adequate   Concentration/Attention:  impaired   Fund of Knowledge average   Insight:  limited   Reliability poor   Judgment:  limited          VITALS:     Patient Vitals for the past 24 hrs:   Temp Pulse Resp BP   05/20/15 0700 97 ??F (36.1 ??C) (!) 55 18 140/69 mmHg     Wt Readings from Last 3 Encounters:   05/13/15 95.255 kg (210 lb)     Temp Readings from Last 3 Encounters:   05/20/15 97 ??F (36.1 ??C)      BP Readings from Last 3 Encounters:   05/20/15 140/69     Pulse Readings from Last 3 Encounters:   05/20/15 55            DATA     LABORATORY DATA:(reviewed/updated 05/20/2015)  No results found for this or any previous visit (from the past 24 hour(s)).  Lab Results   Component Value Date/Time    VALPROIC ACID 73 05/17/2015 06:09 AM     Lab Results   Component Value Date/Time    LITHIUM 0.73 05/17/2015 06:09 AM      RADIOLOGY REPORTS:(reviewed/updated 05/20/2015)  No results found.       MEDICATIONS     ALL MEDICATIONS:   Current Facility-Administered Medications   Medication Dose Route Frequency   ??? divalproex ER (DEPAKOTE ER) 24 hour tablet 2,000 mg  2,000 mg Oral QHS   ??? ARIPiprazole (ABILIFY) tablet 30 mg  30 mg Oral DAILY   ??? lithium carbonate capsule 900 mg  900 mg Oral QHS   ??? atenolol (TENORMIN) tablet 25 mg  25 mg Oral DAILY   ??? amLODIPine (NORVASC) tablet 10 mg  10 mg Oral DAILY   ??? benztropine (COGENTIN) tablet 1 mg  1 mg Oral BID PRN   ??? benztropine (COGENTIN) injection 1 mg  1 mg IntraMUSCular Q12H PRN   ??? LORazepam (ATIVAN) injection 0.5 mg  0.5 mg IntraMUSCular Q4H PRN   ??? LORazepam (ATIVAN) tablet 0.5 mg  0.5 mg Oral Q4H PRN   ??? zolpidem (AMBIEN) tablet 5 mg  5 mg Oral QHS PRN    ??? acetaminophen (TYLENOL) tablet 650 mg  650 mg Oral Q4H PRN   ??? magnesium hydroxide (MILK OF MAGNESIA) oral suspension 30 mL  30 mL Oral DAILY PRN   ??? nicotine (NICODERM CQ) 21 mg/24 hr patch 1  Patch  1 Patch TransDERmal DAILY PRN      SCHEDULED MEDICATIONS:   Current Facility-Administered Medications   Medication Dose Route Frequency   ??? divalproex ER (DEPAKOTE ER) 24 hour tablet 2,000 mg  2,000 mg Oral QHS   ??? ARIPiprazole (ABILIFY) tablet 30 mg  30 mg Oral DAILY   ??? lithium carbonate capsule 900 mg  900 mg Oral QHS   ??? atenolol (TENORMIN) tablet 25 mg  25 mg Oral DAILY   ??? amLODIPine (NORVASC) tablet 10 mg  10 mg Oral DAILY          ASSESSMENT & PLAN     DIAGNOSES REQUIRING ACTIVE TREATMENT AND MONITORING: (reviewed/updated 05/20/2015)  Patient Active Hospital Problem List:     Bipolar disorder with psychotic features (HCC) (05/05/2015)    Assessment: still quite psychotic and manic. He still regressing.   Plan: I will continue to adjust psych meds.  Transfer to the acute side again.   I will continue to monitor blood levels (depakote and lithium---drugs with a narrow therapeutic index= NTI) and associated labs for drug therapy implemented that require intense monitoring for toxicity as deemed appropriate base on current medication side effects and pharmacodynamically determined drug 1/2 lives.      Coarse tremors (05/05/2015)    Assessment: stable, improved    Plan: continue to mnoitor     Hypertension (05/05/2015)    Assessment: stable    Plan: monitor. Cont to adjust bp meds    tx noncompliance-- a new dx. Sig decompensations over the last yr.         Complete current electronic health record for patient has been reviewed today including consultant notes, ancillary staff notes, nurses and psychiatric tech notes.      The following regarding medications was addressed during rounds with patient:   the risks and benefits of the proposed medication. The patient was given  the opportunity to ask questions. Informed consent given to the use of the above medications. Will continue to adjust psychiatric and non-psychiatric medications (see above "medication" section and orders section for details) as deemed appropriate & based upon diagnoses and response to treatment.     I will continue to order blood tests/labs and diagnostic tests as deemed appropriate and review results as they become available (see orders for details and above listed lab/test results).    I will order psychiatric records from previous psych hospitals to further elucidate the nature of patient's psychopathology and review once available.    I will gather additional collateral information from friends, family and o/p treatment team to further elucidate the nature of patient's psychopathology and baselline level of psychiatric functioning.         I certify that this patient's inpatient psychiatric hospital services furnished since the previous certification were, and continue to be, required for treatment that could reasonably be expected to improve the patient's condition, or for diagnostic study, and that the patient continues to need, on a daily basis, active treatment furnished directly by or requiring the supervision of inpatient psychiatric facility personnel. In addition, the hospital records show that services furnished were intensive treatment services, admission or related services, or equivalent services.    EXPECTED DISCHARGE DATE/DAY: TBD     DISPOSITION: Home  Psych FU with in [place] ?       Signed By:   Mackie Pai, MD  05/20/2015

## 2015-05-20 NOTE — Progress Notes (Signed)
Problem: Altered Thought Process (Adult/Pediatric)  Goal: *STG: Decreased hallucinations  Outcome: Progressing Towards Goal  Pt has been isolated to his room resting quietly most of the time during the shift. Pt was food and med compliant, Pt was appropriate with staff and other pts in the mileu. Pt denied any S/I and H/I at this time. Pt has concern about the dosage of his lithium, Pt states lithium 900 mg is too much for him, that he takes 600 mg at home. Pt encouraged to follow up with psychiatrist tomorrow morning, attend all group activities and to discuss any issues or concerns with staff. Staff will also continue to monitor pt with Q -15 checks for safety and needs.

## 2015-05-20 NOTE — Behavioral Health Treatment Team (Signed)
Patient exhibits no insight.  He stated today that he wouldn't take any medications once discharged.  He expressed illogical thinking and confusion about what brought him to the hospital.  Due to this decompensation the patient's discharge was postponed. His provider and sister were informed of the change.  Worker will contact provider to re-schedule once discharge is re-set.      Corey CulverGretchen Garber, LCSW

## 2015-05-20 NOTE — Behavioral Health Treatment Team (Cosign Needed)
GROUP THERAPY PROGRESS NOTE    The patient Corey Crawford a 69 y.o. male is participating in Coping Skills Group.     Group time: 45 minutes    Personal goal for participation: To develop a personal plan for success    Goal orientation:  personal    Group therapy participation: active    Therapeutic interventions reviewed and discussed: positive coping strategies/goals    Impression of participation:  The patient was attentive.    BEVERLY S BAKER  05/20/2015  4:44 PM

## 2015-05-21 MED FILL — ABILIFY 15 MG TABLET: 15 mg | ORAL | Qty: 2

## 2015-05-21 MED FILL — LITHIUM CARBONATE 300 MG CAP: 300 mg | ORAL | Qty: 3

## 2015-05-21 MED FILL — DEPAKOTE ER 500 MG TABLET,EXTENDED RELEASE: 500 mg | ORAL | Qty: 4

## 2015-05-21 MED FILL — ATENOLOL 50 MG TAB: 50 mg | ORAL | Qty: 1

## 2015-05-21 MED FILL — AMLODIPINE 5 MG TAB: 5 mg | ORAL | Qty: 2

## 2015-05-21 NOTE — Behavioral Health Treatment Team (Cosign Needed)
Pt did not attend group, encouraged to attend groups.

## 2015-05-21 NOTE — Behavioral Health Treatment Team (Signed)
Client acting very confused and disoriented this pm.  Came to the desk many times stating he could not find his room.  Standing across from Rm 313 and client in 312.  Redirected back to the male end of the hall.  No interaction with others, no coloring or joking as he had been doing earlier.  Meal and med compliant, q 15 min checks for safety continue.

## 2015-05-21 NOTE — Behavioral Health Treatment Team (Cosign Needed)
Pt did not attend coping skills group.

## 2015-05-21 NOTE — Behavioral Health Treatment Team (Cosign Needed)
GROUP THERAPY PROGRESS NOTE    Corey LennoxMarvin Fischl is participating in [employer] Meeting.     Group time: 30 minutes    Personal goal for participation:  Unit orientation    Goal orientation: Community    Group therapy participation: Pt did not attend    Therapeutic interventions reviewed and discussed: Yes    Impression of participation: N/A

## 2015-05-21 NOTE — Behavioral Health Treatment Team (Signed)
PSYCHIATRIC PROGRESS NOTE         Patient Name  Corey Crawford   Date of Birth 1946-03-14   CSN [id number]   Medical Record Number  [id number]      Age  69 y.o.   PCP Phys Other, MD   Admit date:  05/04/2015    Room Number  312/02  @ [facility]   Date of Service  05/21/2015          PSYCHOTHERAPY SESSION NOTE:  Length of psychotherapy session: 20 minutes    Main condition/diagnosis/issues treated during session today, 05/21/2015 :  paranoia, denial, mania, tx noncompliance    I employed  supportive psychotherapy in regards to various ongoing psychosocial stressors, including the following: pre-admission and current problems; housing issues; medical issues; and stress of hospitalization. Interpersonal relationship issues and psychodynamic conflicts explored.  Attempts made to alleviate maladaptive patterns. We, also, worked on issues of denial & effects of substance dependency/use     Overall, patient is slowly improving today    Treatment Plan Update (reviewed an updated 05/21/2015) :  I will modify psychotherapy tx plan by implementing more stress management strategies, building upon cognitive behavioral techniques, increasing coping skills, as well as shoring up psychological defenses).    An extended energy and skill set was needed to engage pt in psychotherapy due to some of the following: resistiveness, complexity, negativity, confrontational nature, hostile behaviors, and/or severe abnormalities in thought processes/psychosis resulting in the loss of expressive/receptive language communication skills.                            E & M PROGRESS NOTE:         HISTORY       CC:  "i feel better, not all squiggly "  HISTORY OF PRESENT ILLNESS/INTERVAL HISTORY:  (reviewed/updated 05/21/2015).  In summary, Corey Crawford, is a 69 y.o.  male who presents with a severe exacerbation of the principal diagnosis, Bipolar disorder with psychotic  features (HCC), which is improving and not stable. Patient requires continued inpatient hospitalization for further stabilization, safety monitoring and medication management.  I will continue to coordinate the provision of individual, milieu, occupational, group, and substance abuse therapies to address target symptoms/diagnoses as deemed appropriate for the individual patient.  A coordinated, multidisplinary treatment team round was conducted with the patient (this team consists of the nurse, psychiatric unit pharmcist, Administrator).     per initial evaluation: The patient, Corey Crawford, is a 69 y.o.?? WHITE OR CAUCASIAN male with a past psychiatric history significant for chronic mental illness , who presents at this time for a severe exacerbation of the principle diagnosis of Bipolar disorder with psychotic features (HCC). Patient reports/evidences the following emotional symptoms:?? agitation, mania and psychosis.?? Additional symptomatology include agitation, anger outbursts, difficulty sleeping, increased irritability and problem with medication.?? The above symptoms have been present for . These symptoms are of severe in?? severity per patient's report. The symptoms are constant?? fleeting in nature.?? The patient's condition has been precipitated by and psychosocial stressors( multiple losses,death in family,diagnosed with colon cancer ).?? Patient's condition made worse by treatment noncompliance. UDS negative, BAL=0. ??  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    Corey Crawford presents/reports/evidences the following emotional symptoms today, 05/21/2015:  paranoia, mania.  The above symptoms have been present for few months, was at a different hosp for 2 weeks just prior to this admission.  These sxs were improving the beginning of last week, since has been regressing. These symptoms are of severe severity. The symptoms are constant in nature. Additional symptomatology include disorganized thought processes, labile moods, odd demeanor, decreased grandiose, improved sleep, and irritability.      SIDE EFFECTS: (reviewed/updated 05/21/2015)  None reported or admitted to.  No noted toxicity with use of lithium or depakote   ALLERGIES:(reviewed/updated 05/21/2015)  No Known Allergies   MEDICATIONS PRIOR TO ADMISSION:(reviewed/updated 05/21/2015)  Prescriptions prior to admission   Medication Sig   ??? lithium carbonate 600 mg capsule Take 600 mg by mouth nightly. Indications: BIPOLAR DISORDER   ??? atenolol (TENORMIN) 25 mg tablet Take 25 mg by mouth daily. Indications: HYPERTENSION   ??? amLODIPine (NORVASC) 10 mg tablet Take 10 mg by mouth daily. Indications: HYPERTENSION   ??? haloperidol (HALDOL) 2 mg tablet Take 2 mg by mouth nightly. Indications: BIIPOLAR DISORDER   ??? diphenhydrAMINE (BENADRYL) 25 mg capsule Take 25 mg by mouth nightly.      PAST MEDICAL HISTORY: Past medical history from the initial psychiatric evaluation has been reviewed (reviewed/updated 05/21/2015) with no additional updates (I asked patient and no additional past medical history provided). No past medical history on file.No past surgical history on file.   SOCIAL HISTORY: Social history from the initial psychiatric evaluation has been reviewed (reviewed/updated 05/21/2015) with no additional updates (I asked patient and no additional social history provided).   History     Social History   ??? Marital Status: LEGALLY SEPARATED     Spouse Name: N/A    ??? Number of Children: N/A   ??? Years of Education: N/A     Occupational History   ??? Not on file.     Social History Main Topics   ??? Smoking status: Not on file   ??? Smokeless tobacco: Not on file   ??? Alcohol Use: Not on file   ??? Drug Use: Not on file   ??? Sexual Activity: Not on file     Other Topics Concern   ??? Not on file     Social History Narrative   ??? No narrative on file      FAMILY HISTORY: Family history from the initial psychiatric evaluation has been reviewed (reviewed/updated 05/21/2015) with no additional updates (I asked patient and no additional family history provided).   Family History   Problem Relation Age of Onset   ??? Bipolar Disorder Father      committed suicide       REVIEW OF SYSTEMS: (reviewed/updated 05/21/2015)  Appetite:good   Sleep: good   All other Review of Systems:   negative for - suicidal ideation  Respiratory ROS: no cough, shortness of breath, or wheezing  Cardiovascular ROS: no chest pain or dyspnea on exertion         MENTAL STATUS EXAM & VITALS     MENTAL STATUS EXAM (MSE):    MSE FINDINGS ARE WITHIN NORMAL LIMITS (WNL) UNLESS OTHERWISE STATED BELOW. ( ALL OF THE BELOW CATEGORIES OF THE MSE HAVE BEEN  REVIEWED (reviewed 05/21/2015) AND UPDATED AS DEEMED APPROPRIATE )  General Presentation age appropriate, casually dressed and older than stated age, unreliable and vague, very poor eye contact   Orientation oriented to time, place and person   Vital Signs  See below (reviewed 05/21/2015); Vital Signs (BP, Pulse, & Temp) are within normal limits if not listed below.   Gait and Station Stable/steady, no ataxia   Musculoskeletal System No extrapyramidal symptoms (EPS); no abnormal muscular movements or Tardive Dyskinesia (TD); muscle strength and tone are within normal limits   Language No aphasia or dysarthria   Speech:  monotone   Thought Processes illogical, wnl rate of thoughts, poor abstract reasoning and computation   Thought Associations disorganized--flight of ideas/tangential,  perseverative   Thought Content Delusions, no hallucinations   Suicidal Ideations none   Homicidal Ideations none   Mood:  irritable, labile   Affect:  Irritable, labile, odd demeanor, very poor eye contact , odd/inapprop smiling   Memory recent  adequate   Memory remote:  adequate   Concentration/Attention:  impaired   Fund of Knowledge average   Insight:  limited   Reliability poor   Judgment:  limited          VITALS:     Patient Vitals for the past 24 hrs:   Temp Pulse Resp BP   05/21/15 0538 98.2 ??F (36.8 ??C) (!) 52 (!) 116 108/61 mmHg     Wt Readings from Last 3 Encounters:   05/13/15 95.255 kg (210 lb)     Temp Readings from Last 3 Encounters:   05/21/15 98.2 ??F (36.8 ??C)      BP Readings from Last 3 Encounters:   05/21/15 108/61     Pulse Readings from Last 3 Encounters:   05/21/15 52            DATA     LABORATORY DATA:(reviewed/updated 05/21/2015)  No results found for this or any previous visit (from the past 24 hour(s)).  Lab Results   Component Value Date/Time    VALPROIC ACID 73 05/17/2015 06:09 AM     Lab Results   Component Value Date/Time    LITHIUM 0.73 05/17/2015 06:09 AM      RADIOLOGY REPORTS:(reviewed/updated 05/21/2015)  No results found.       MEDICATIONS     ALL MEDICATIONS:   Current Facility-Administered Medications   Medication Dose Route Frequency   ??? divalproex ER (DEPAKOTE ER) 24 hour tablet 2,000 mg  2,000 mg Oral QHS   ??? ARIPiprazole (ABILIFY) tablet 30 mg  30 mg Oral DAILY   ??? lithium carbonate capsule 900 mg  900 mg Oral QHS   ??? atenolol (TENORMIN) tablet 25 mg  25 mg Oral DAILY   ??? amLODIPine (NORVASC) tablet 10 mg  10 mg Oral DAILY   ??? benztropine (COGENTIN) tablet 1 mg  1 mg Oral BID PRN   ??? benztropine (COGENTIN) injection 1 mg  1 mg IntraMUSCular Q12H PRN   ??? LORazepam (ATIVAN) injection 0.5 mg  0.5 mg IntraMUSCular Q4H PRN   ??? LORazepam (ATIVAN) tablet 0.5 mg  0.5 mg Oral Q4H PRN   ??? zolpidem (AMBIEN) tablet 5 mg  5 mg Oral QHS PRN    ??? acetaminophen (TYLENOL) tablet 650 mg  650 mg Oral Q4H PRN   ??? magnesium hydroxide (MILK OF MAGNESIA) oral suspension 30 mL  30 mL Oral DAILY PRN   ??? nicotine (NICODERM CQ) 21 mg/24 hr patch 1 Patch  1 Patch TransDERmal  DAILY PRN      SCHEDULED MEDICATIONS:   Current Facility-Administered Medications   Medication Dose Route Frequency   ??? divalproex ER (DEPAKOTE ER) 24 hour tablet 2,000 mg  2,000 mg Oral QHS   ??? ARIPiprazole (ABILIFY) tablet 30 mg  30 mg Oral DAILY   ??? lithium carbonate capsule 900 mg  900 mg Oral QHS   ??? atenolol (TENORMIN) tablet 25 mg  25 mg Oral DAILY   ??? amLODIPine (NORVASC) tablet 10 mg  10 mg Oral DAILY          ASSESSMENT & PLAN     DIAGNOSES REQUIRING ACTIVE TREATMENT AND MONITORING: (reviewed/updated 05/21/2015)  Patient Active Hospital Problem List:     Bipolar disorder with psychotic features vs schizoaffective dis    Assessment: still quite psychotic and manic. He still regressing, no sig change in status over the last few days, still quite odd, psychotic, with labile mood issues other than irritability. Pt has been decompensated for several months PTA since tx noncompliance. Collateral info from family obtained.    Plan: I will continue to adjust psych meds.   I will continue to monitor blood levels (depakote and lithium---drugs with a narrow therapeutic index= NTI) and associated labs for drug therapy implemented that require intense monitoring for toxicity as deemed appropriate base on current medication side effects and pharmacodynamically determined drug 1/2 lives. Will repeat levels in am.      Coarse tremors (05/05/2015)    Assessment: stable, improved    Plan: continue to mnoitor     Hypertension (05/05/2015)    Assessment: stable    Plan: monitor. Cont to adjust bp meds    tx noncompliance-- a new dx. Sig decompensations over the last yr.         Complete current electronic health record for patient has been reviewed  today including consultant notes, ancillary staff notes, nurses and psychiatric tech notes.      The following regarding medications was addressed during rounds with patient:   the risks and benefits of the proposed medication. The patient was given the opportunity to ask questions. Informed consent given to the use of the above medications. Will continue to adjust psychiatric and non-psychiatric medications (see above "medication" section and orders section for details) as deemed appropriate & based upon diagnoses and response to treatment.     I will continue to order blood tests/labs and diagnostic tests as deemed appropriate and review results as they become available (see orders for details and above listed lab/test results).    I will order psychiatric records from previous psych hospitals to further elucidate the nature of patient's psychopathology and review once available.    I will gather additional collateral information from friends, family and o/p treatment team to further elucidate the nature of patient's psychopathology and baselline level of psychiatric functioning.         I certify that this patient's inpatient psychiatric hospital services furnished since the previous certification were, and continue to be, required for treatment that could reasonably be expected to improve the patient's condition, or for diagnostic study, and that the patient continues to need, on a daily basis, active treatment furnished directly by or requiring the supervision of inpatient psychiatric facility personnel. In addition, the hospital records show that services furnished were intensive treatment services, admission or related services, or equivalent services.    EXPECTED DISCHARGE DATE/DAY: TBD     DISPOSITION: Home  Psych FU with in [place] ?  Signed By:   Mackie Pai, MD  05/21/2015

## 2015-05-21 NOTE — Progress Notes (Signed)
Problem: Psychosis  Goal: *STG/LTG: Demonstrates improved social functioning by responding appropriately to staff  Outcome: Progressing Towards Goal  Patient remains isolative. Flat sad affect. Rambling and delusional. Disorganized in conversation.  Medication and meal complaint. Patient encouraged to attend groups and socialize with peers. Denies SI/HI.A/V hallucinations. Contracts for safety. Staff will continue to monitor patient [id number] safety checks.

## 2015-05-21 NOTE — Behavioral Health Treatment Team (Cosign Needed)
GROUP THERAPY PROGRESS NOTE    The patient Corey Crawford a 69 y.o. male is participating in [employer].     Group time: 1 hour    Personal goal for participation: To concentrate on selected task    Goal orientation: social    Group therapy participation: minimal    Therapeutic interventions reviewed and discussed: Crafts, games, music    Impression of participation: The patient was present-left early    BEVERLY S BAKER  05/21/2015  5:19 PM

## 2015-05-21 NOTE — Progress Notes (Signed)
Problem: Falls - Risk of  Goal: *Absence of falls  Outcome: Progressing Towards Goal  Patient has been resting in their room with their eyes closed and has showed no signs of distress through out the shift. Patient slept for 6  hours. Patient is on every 15 minute checks for safety.

## 2015-05-21 NOTE — Behavioral Health Treatment Team (Cosign Needed)
GROUP THERAPY PROGRESS NOTE    Corey LennoxMarvin Crawford is participating in [employer].     Group time: 30 minutes    Personal goal for participation: reality orientation    Goal orientation: social    Group therapy participation: active    Therapeutic interventions reviewed and discussed: yes    Impression of participation: Good

## 2015-05-22 LAB — HEPATIC FUNCTION PANEL
A-G Ratio: 0.8 — ABNORMAL LOW (ref 1.1–2.2)
ALT (SGPT): 22 U/L (ref 12–78)
AST (SGOT): 11 U/L — ABNORMAL LOW (ref 15–37)
Albumin: 3.3 g/dL — ABNORMAL LOW (ref 3.5–5.0)
Alk. phosphatase: 68 U/L (ref 45–117)
Bilirubin, direct: 0.1 MG/DL (ref 0.0–0.2)
Bilirubin, total: 0.4 MG/DL (ref 0.2–1.0)
Globulin: 4.1 g/dL — ABNORMAL HIGH (ref 2.0–4.0)
Protein, total: 7.4 g/dL (ref 6.4–8.2)

## 2015-05-22 LAB — LITHIUM: Lithium level: 0.91 MMOL/L (ref 0.60–1.20)

## 2015-05-22 LAB — VALPROIC ACID: Valproic acid: 96 ug/ml (ref 50–100)

## 2015-05-22 MED FILL — ABILIFY 15 MG TABLET: 15 mg | ORAL | Qty: 2

## 2015-05-22 MED FILL — AMLODIPINE 5 MG TAB: 5 mg | ORAL | Qty: 2

## 2015-05-22 MED FILL — DEPAKOTE ER 500 MG TABLET,EXTENDED RELEASE: 500 mg | ORAL | Qty: 4

## 2015-05-22 MED FILL — LITHIUM CARBONATE 300 MG CAP: 300 mg | ORAL | Qty: 3

## 2015-05-22 MED FILL — MAPAP (ACETAMINOPHEN) 325 MG TABLET: 325 mg | ORAL | Qty: 2

## 2015-05-22 MED FILL — ATENOLOL 50 MG TAB: 50 mg | ORAL | Qty: 1

## 2015-05-22 NOTE — Behavioral Health Treatment Team (Signed)
PSYCHIATRIC PROGRESS NOTE         Patient Name  Corey Crawford   Date of Birth 05-10-1946   CSN [id number]   Medical Record Number  [id number]      Age  69 y.o.   PCP Phys Other, MD   Admit date:  05/04/2015    Room Number  311/02  @ [facility]   Date of Service  05/22/2015          PSYCHOTHERAPY SESSION NOTE:  Length of psychotherapy session: 20 minutes    Main condition/diagnosis/issues treated during session today, 05/22/2015 :  paranoia, denial, mania, disorganization, tx noncompliance    I employed  supportive psychotherapy in regards to various ongoing psychosocial stressors, including the following: pre-admission and current problems; housing issues; medical issues; and stress of hospitalization. Interpersonal relationship issues and psychodynamic conflicts explored.  Attempts made to alleviate maladaptive patterns. We, also, worked on issues of denial & effects of substance dependency/use     Overall, patient is slowly improving today    Treatment Plan Update (reviewed an updated 05/22/2015) :  I will modify psychotherapy tx plan by implementing more stress management strategies, building upon cognitive behavioral techniques, increasing coping skills, as well as shoring up psychological defenses).    An extended energy and skill set was needed to engage pt in psychotherapy due to some of the following: resistiveness, complexity, negativity, confrontational nature, hostile behaviors, and/or severe abnormalities in thought processes/psychosis resulting in the loss of expressive/receptive language communication skills.                            E & M PROGRESS NOTE:         HISTORY       CC:  "i 'm better "  HISTORY OF PRESENT ILLNESS/INTERVAL HISTORY:  (reviewed/updated 05/22/2015).  In summary, Corey Crawford, is a 69 y.o.  male who presents with a severe exacerbation of the principal diagnosis, Bipolar disorder with psychotic features ([place]), which is improving and not stable. Patient requires  continued inpatient hospitalization for further stabilization, safety monitoring and medication management.  I will continue to coordinate the provision of individual, milieu, occupational, group, and substance abuse therapies to address target symptoms/diagnoses as deemed appropriate for the individual patient.  A coordinated, multidisplinary treatment team round was conducted with the patient (this team consists of the nurse, psychiatric unit pharmcist, Catering manager).     per initial evaluation: The patient, Corey Crawford, is a 69 y.o.?? WHITE OR CAUCASIAN male with a past psychiatric history significant for chronic mental illness , who presents at this time for a severe exacerbation of the principle diagnosis of Bipolar disorder with psychotic features ([place]). Patient reports/evidences the following emotional symptoms:?? agitation, mania and psychosis.?? Additional symptomatology include agitation, anger outbursts, difficulty sleeping, increased irritability and problem with medication.?? The above symptoms have been present for . These symptoms are of severe in?? severity per patient's report. The symptoms are constant?? fleeting in nature.?? The patient's condition has been precipitated by and psychosocial stressors( multiple losses,death in family,diagnosed with colon cancer ).?? Patient's condition made worse by treatment noncompliance. UDS negative, BAL=0. ??  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     Corey Crawford presents/reports/evidences the following emotional symptoms today, 05/22/2015:  paranoia, mania.  The above symptoms have been present for few months, was at a different hosp for 2 weeks just prior to this admission.  These sxs were improving the beginning of last week, since has been regressing. These symptoms are of severe severity. The symptoms are constant in nature. Additional symptomatology include disorganized thought processes and behaviors, labile moods, odd demeanor, decreased grandiose, improved sleep, and irritability.      SIDE EFFECTS: (reviewed/updated 05/22/2015)  None reported or admitted to.  No noted toxicity with use of lithium or depakote   ALLERGIES:(reviewed/updated 05/22/2015)  No Known Allergies   MEDICATIONS PRIOR TO ADMISSION:(reviewed/updated 05/22/2015)  Prescriptions prior to admission   Medication Sig   ??? lithium carbonate 600 mg capsule Take 600 mg by mouth nightly. Indications: BIPOLAR DISORDER   ??? atenolol (TENORMIN) 25 mg tablet Take 25 mg by mouth daily. Indications: HYPERTENSION ??? amLODIPine (NORVASC) 10 mg tablet Take 10 mg by mouth daily. Indications: HYPERTENSION   ??? haloperidol (HALDOL) 2 mg tablet Take 2 mg by mouth nightly. Indications: BIIPOLAR DISORDER   ??? diphenhydrAMINE (BENADRYL) 25 mg capsule Take 25 mg by mouth nightly.      PAST MEDICAL HISTORY: Past medical history from the initial psychiatric evaluation has been reviewed (reviewed/updated 05/22/2015) with no additional updates (I asked patient and no additional past medical history provided). No past medical history on file.No past surgical history on file.   SOCIAL HISTORY: Social history from the initial psychiatric evaluation has been reviewed (reviewed/updated 05/22/2015) with no additional updates (I asked patient and no additional social history provided).   History     Social History   ??? Marital Status: LEGALLY SEPARATED     Spouse Name: N/A   ??? Number of Children: N/A   ??? Years of Education: N/A      Occupational History   ??? Not on file.     Social History Main Topics   ??? Smoking status: Not on file   ??? Smokeless tobacco: Not on file   ??? Alcohol Use: Not on file   ??? Drug Use: Not on file   ??? Sexual Activity: Not on file     Other Topics Concern   ??? Not on file     Social History Narrative   ??? No narrative on file      FAMILY HISTORY: Family history from the initial psychiatric evaluation has been reviewed (reviewed/updated 05/22/2015) with no additional updates (I asked patient and no additional family history provided).   Family History   Problem Relation Age of Onset   ??? Bipolar Disorder Father      committed suicide       REVIEW OF SYSTEMS: (reviewed/updated 05/22/2015)  Appetite:good   Sleep: good   All other Review of Systems:   negative for - suicidal ideation  Respiratory ROS: no cough, shortness of breath, or wheezing  Cardiovascular ROS: no chest pain or dyspnea on exertion         [place] (MSE):    MSE FINDINGS ARE WITHIN NORMAL LIMITS (WNL) UNLESS OTHERWISE STATED BELOW. ( ALL OF THE BELOW CATEGORIES OF THE MSE HAVE BEEN REVIEWED (reviewed  05/22/2015) AND UPDATED AS DEEMED APPROPRIATE )  General Presentation age appropriate, casually dressed and older than stated age, unreliable and vague, very poor eye contact   Orientation oriented to time, place and person   Vital Signs  See below (reviewed 05/22/2015); Vital Signs (BP, Pulse, & Temp) are within normal limits if not listed below.   Gait and Station Stable/steady, no ataxia Musculoskeletal System No extrapyramidal symptoms (EPS); no abnormal muscular movements or Tardive Dyskinesia (TD); muscle strength and tone are within normal limits   Language No aphasia or dysarthria   Speech:  monotone   Thought Processes illogical, wnl rate of thoughts, poor abstract reasoning and computation   Thought Associations disorganized--tangential, perseverative   Thought Content Delusions, no hallucinations    Suicidal Ideations none   Homicidal Ideations none   Mood:  irritable, labile   Affect:  odd demeanor, very poor eye contact , odd/inapprop smiling   Memory recent  adequate   Memory remote:  adequate   Concentration/Attention:  impaired   Fund of Knowledge average   Insight:  limited   Reliability poor   Judgment:  limited          VITALS:     Patient Vitals for the past 24 hrs:   Temp Pulse Resp BP SpO2   05/22/15 1745 - - - 142/48 mmHg -   05/22/15 1401 97.9 ??F (36.6 ??C) (!) 57 18 131/75 mmHg 97 %   05/22/15 0600 97.8 ??F (36.6 ??C) (!) 48 18 135/71 mmHg -     Wt Readings from Last 3 Encounters:   05/13/15 95.255 kg (210 lb)     Temp Readings from Last 3 Encounters:   05/22/15 97.9 ??F (36.6 ??C)      BP Readings from Last 3 Encounters:   05/22/15 142/48     Pulse Readings from Last 3 Encounters:   05/22/15 57          DATA     LABORATORY DATA:(reviewed/updated 05/22/2015)  Recent Results (from the past 24 hour(s))   VALPROIC ACID    Collection Time: 05/22/15  5:07 AM   Result Value Ref Range    Valproic acid 96 50 - 100 ug/ml   LITHIUM    Collection Time: 05/22/15  5:07 AM   Result Value Ref Range    Lithium 0.91 0.60 - 1.20 MMOL/L    Reported dose date: UNKNOWN      Reported dose time: UNKNOWN      Reported dose: UNKNOWN UNITS   HEPATIC FUNCTION PANEL    Collection Time: 05/22/15  5:07 AM   Result Value Ref Range    Protein, total 7.4 6.4 - 8.2 g/dL    Albumin 3.3 (L) 3.5 - 5.0 g/dL    Globulin 4.1 (H) 2.0 - 4.0 g/dL    A-G Ratio 0.8 (L) 1.1 - 2.2      Bilirubin, total 0.4 0.2 - 1.0 MG/DL    Bilirubin, direct 0.1 0.0 - 0.2 MG/DL    Alk. phosphatase 68 45 - 117 U/L    AST 11 (L) 15 - 37 U/L    ALT 22 12 - 78 U/L     Lab Results   Component Value Date/Time    VALPROIC ACID 96 05/22/2015 05:07 AM     Lab Results   Component Value Date/Time    LITHIUM 0.91 05/22/2015 05:07 AM      RADIOLOGY REPORTS:(reviewed/updated 05/22/2015)  No results found.       MEDICATIONS  ALL MEDICATIONS:    Current Facility-Administered Medications   Medication Dose Route Frequency   ??? divalproex ER (DEPAKOTE ER) 24 hour tablet 2,000 mg  2,000 mg Oral QHS   ??? ARIPiprazole (ABILIFY) tablet 30 mg  30 mg Oral DAILY   ??? lithium carbonate capsule 900 mg  900 mg Oral QHS   ??? atenolol (TENORMIN) tablet 25 mg  25 mg Oral DAILY   ??? amLODIPine (NORVASC) tablet 10 mg  10 mg Oral DAILY   ??? benztropine (COGENTIN) tablet 1 mg  1 mg Oral BID PRN   ??? benztropine (COGENTIN) injection 1 mg  1 mg IntraMUSCular Q12H PRN   ??? LORazepam (ATIVAN) injection 0.5 mg  0.5 mg IntraMUSCular Q4H PRN   ??? LORazepam (ATIVAN) tablet 0.5 mg  0.5 mg Oral Q4H PRN   ??? zolpidem (AMBIEN) tablet 5 mg  5 mg Oral QHS PRN   ??? acetaminophen (TYLENOL) tablet 650 mg  650 mg Oral Q4H PRN   ??? magnesium hydroxide (MILK OF MAGNESIA) oral suspension 30 mL  30 mL Oral DAILY PRN   ??? nicotine (NICODERM CQ) 21 mg/24 hr patch 1 Patch  1 Patch TransDERmal DAILY PRN      SCHEDULED MEDICATIONS:   Current Facility-Administered Medications   Medication Dose Route Frequency   ??? divalproex ER (DEPAKOTE ER) 24 hour tablet 2,000 mg  2,000 mg Oral QHS   ??? ARIPiprazole (ABILIFY) tablet 30 mg  30 mg Oral DAILY   ??? lithium carbonate capsule 900 mg  900 mg Oral QHS   ??? atenolol (TENORMIN) tablet 25 mg  25 mg Oral DAILY   ??? 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   Pt has been decompensated for several months PTA since tx noncompliance.   Plan: I will continue to adjust psych meds. Some c/o sedation and dizziness, will decreased depakote sl.   I will continue to monitor blood levels (depakote and lithium---drugs with  a narrow therapeutic index= NTI) and associated labs for drug therapy implemented that require intense monitoring for toxicity as deemed appropriate base on current medication side effects and pharmacodynamically determined drug 1/2 lives.  repeat levels wnl.      Coarse tremors (05/05/2015)    Assessment: stable, improved    Plan: continue to mnoitor     Hypertension (05/05/2015)    Assessment: stable    Plan: monitor. Cont to adjust bp meds    tx noncompliance-- a new dx. Sig decompensations over the last yr.         Complete current electronic health record for patient has been reviewed today including consultant notes, ancillary staff notes, nurses and psychiatric tech notes.      The following regarding medications was addressed during rounds with patient:   the risks and benefits of the proposed medication. The patient was given the opportunity to ask questions. Informed consent given to the use of the above medications. Will continue to adjust psychiatric and non-psychiatric medications (see above "medication" section and orders section for details) as deemed appropriate & based upon diagnoses and response to treatment.  I will continue to order blood tests/labs and diagnostic tests as deemed appropriate and review results as they become available (see orders for details and above listed lab/test results).    I will order psychiatric records from previous psych hospitals to further elucidate the nature of patient's psychopathology and review once available.    I will gather additional collateral information from friends, family and o/p treatment team to further elucidate the nature of patient's psychopathology and baselline level of psychiatric functioning.         I certify that this patient's inpatient psychiatric hospital services furnished since the previous certification were, and continue to be, required for treatment that could reasonably be expected to improve the  patient's condition, or for diagnostic study, and that the patient continues to need, on a daily basis, active treatment furnished directly by or requiring the supervision of inpatient psychiatric facility personnel. In addition, the hospital records show that services furnished were intensive treatment services, admission or related services, or equivalent services.    EXPECTED DISCHARGE DATE/DAY: TBD     DISPOSITION: Home  Psych FU with in [place] ?       Signed By:   Leandra Kern, MD  05/22/2015

## 2015-05-22 NOTE — Behavioral Health Treatment Team (Cosign Needed)
Pt was encouraged to attend groups and spend less time in the bed.  Pt was compliant and attended groups and spent less time in the bed.  Pt will continue to be monitored for safety.

## 2015-05-22 NOTE — Behavioral Health Treatment Team (Addendum)
Patient continues to isolate in room, resting in bed most of shift. Blood pressure retaken.Corey Crawford.lying  130/60.Corey Crawford.Sitting 120/60....standing 140/62.Patient states he feels [place]", specifically states, I feel dizzy and weak. Patient mood labile, does not make eye contact. Less irritable,making a joke with medication nurse at dinner. Remains compliant with med's and meals.Continue to monitor BP, and safety.

## 2015-05-22 NOTE — Behavioral Health Treatment Team (Signed)
Blood pressure lying 142/48,  Sitting 160/70 with c/o feeling dizzy and weak, standing 182/80 with same complaints of dizziness and weakness.

## 2015-05-22 NOTE — Behavioral Health Treatment Team (Cosign Needed)
GROUP THERAPY PROGRESS NOTE    The patient Corey Crawford a 69 y.o. male is participating in Creative Expression Group.     Group time: 1 hour    Personal goal for participation: To concentrate on selected task    Goal orientation: social    Group therapy participation: minimal     Therapeutic interventions reviewed and discussed: Crafts, games, music    Impression of participation: The patient was present-elected to watch    BEVERLY S BAKER  05/22/2015  5:01 PM

## 2015-05-22 NOTE — Progress Notes (Signed)
Problem: Psychosis  Goal: *STG: Remains safe in hospital  Outcome: Progressing Towards Goal  Patient has been resting in their room with their eyes closed and has showed no signs of distress through out the shift. Patient slept for 7 hours. Patient is on every 15 minute checks for safety. Patient had labs drawn this morning.

## 2015-05-22 NOTE — Behavioral Health Treatment Team (Cosign Needed)
Pt did not attend coping skills group.

## 2015-05-22 NOTE — Progress Notes (Signed)
Mariana KaufmanMarvin participated in Spirituality Group about Compassion on Acute Behavioral Health unit  Chaplain Coletta MemosMartha J. Pittenger [facility]  Chaplain Paging Service [phone]

## 2015-05-22 NOTE — Behavioral Health Treatment Team (Cosign Needed)
Corey Crawford who was absent from [email or web address]    [name]  05/22/2015  11:53 AM

## 2015-05-23 MED ORDER — QUETIAPINE SR 200 MG 24 HR TAB
200 mg | Freq: Every evening | ORAL | Status: DC
Start: 2015-05-23 — End: 2015-05-24
  Administered 2015-05-23: 21:00:00 via ORAL

## 2015-05-23 MED FILL — ABILIFY 15 MG TABLET: 15 mg | ORAL | Qty: 2

## 2015-05-23 MED FILL — ATENOLOL 50 MG TAB: 50 mg | ORAL | Qty: 1

## 2015-05-23 MED FILL — SEROQUEL XR 200 MG TABLET,EXTENDED RELEASE: 200 mg | ORAL | Qty: 1

## 2015-05-23 MED FILL — MAPAP (ACETAMINOPHEN) 325 MG TABLET: 325 mg | ORAL | Qty: 2

## 2015-05-23 MED FILL — LITHIUM CARBONATE 300 MG CAP: 300 mg | ORAL | Qty: 3

## 2015-05-23 MED FILL — DEPAKOTE ER 500 MG TABLET,EXTENDED RELEASE: 500 mg | ORAL | Qty: 4

## 2015-05-23 MED FILL — AMLODIPINE 5 MG TAB: 5 mg | ORAL | Qty: 2

## 2015-05-23 NOTE — Progress Notes (Signed)
Diet as tolerated.?? PMH unremarkable per nutrition  Ht: 5'9"  Wt: 210 lbs  BMI 31 kg/(m^2) c/w obesity grade I  Est energy needs: 1785 kcal, 70 g protein, 1 mL/kcal fluids  Pt will consume 75% of meals at follow up   ??

## 2015-05-23 NOTE — Progress Notes (Signed)
Problem: Psychosis  Goal: *STG: Remains safe in hospital  Outcome: Progressing Towards Goal  Patient has been resting in their room with their eyes closed and has showed no signs of distress through out the shift. Patient slept for 7 hours. Patient is on every 15 minute checks for safety.

## 2015-05-23 NOTE — Behavioral Health Treatment Team (Signed)
PSYCHIATRIC PROGRESS NOTE         Patient Name  Corey LennoxMarvin Dinovo   Date of Birth 05-Nov-1946   CSN [id number]   Medical Record Number  [id number]      Age  69 y.o.   PCP Phys Other, MD   Admit date:  05/04/2015    Room Number  311/02  @ [facility]   Date of Service  05/23/2015          PSYCHOTHERAPY SESSION NOTE:  Length of psychotherapy session: 20 minutes    Main condition/diagnosis/issues treated during session today, 05/23/2015 :  paranoia, mania, disorganization, tx noncompliance    I employed  supportive psychotherapy in regards to various ongoing psychosocial stressors, including the following: pre-admission and current problems; housing issues; medical issues; and stress of hospitalization. Interpersonal relationship issues and psychodynamic conflicts explored.  Attempts made to alleviate maladaptive patterns. We, also, worked on issues of denial & effects of substance dependency/use     Overall, patient is slowly improving today    Treatment Plan Update (reviewed an updated 05/23/2015) :  I will modify psychotherapy tx plan by implementing more stress management strategies, building upon cognitive behavioral techniques, increasing coping skills, as well as shoring up psychological defenses).    An extended energy and skill set was needed to engage pt in psychotherapy due to some of the following: resistiveness, complexity, negativity, confrontational nature, hostile behaviors, and/or severe abnormalities in thought processes/psychosis resulting in the loss of expressive/receptive language communication skills.                            E & M PROGRESS NOTE:         HISTORY       CC:  "i 'want seroquel "  HISTORY OF PRESENT ILLNESS/INTERVAL HISTORY:  (reviewed/updated 05/23/2015).  In summary, Corey Crawford, is a 69 y.o.  male who presents with a severe exacerbation of the principal diagnosis, Bipolar disorder with psychotic features (HCC), which is improving and not stable. Patient requires  continued inpatient hospitalization for further stabilization, safety monitoring and medication management.  I will continue to coordinate the provision of individual, milieu, occupational, group, and substance abuse therapies to address target symptoms/diagnoses as deemed appropriate for the individual patient.  A coordinated, multidisplinary treatment team round was conducted with the patient (this team consists of the nurse, psychiatric unit pharmcist, Administratorsocial worker and writer).     per initial evaluation: The patient, Corey Crawford, is a 69 y.o.?? WHITE OR CAUCASIAN male with a past psychiatric history significant for chronic mental illness , who presents at this time for a severe exacerbation of the principle diagnosis of Bipolar disorder with psychotic features (HCC). Patient reports/evidences the following emotional symptoms:?? agitation, mania and psychosis.?? Additional symptomatology include agitation, anger outbursts, difficulty sleeping, increased irritability and problem with medication.?? The above symptoms have been present for . These symptoms are of severe in?? severity per patient's report. The symptoms are constant?? fleeting in nature.?? The patient's condition has been precipitated by and psychosocial stressors( multiple losses,death in family,diagnosed with colon cancer ).?? Patient's condition made worse by treatment noncompliance. UDS negative, BAL=0. ??  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     [name] presents/reports/evidences the following emotional symptoms today, 05/23/2015:  paranoia, mania.  The above symptoms have been present for few months, was at a different hosp for 2 weeks just prior to this admission.  These sxs were improving the beginning of last week, since has been regressing. These symptoms are of severe severity. The symptoms are constant in nature. Additional symptomatology include disorganized thought processes and behaviors, labile moods, odd demeanor, decreased grandiose, improved sleep, and less irritability.      SIDE EFFECTS: (reviewed/updated 05/23/2015)  None reported or admitted to.  No noted toxicity with use of lithium or depakote   ALLERGIES:(reviewed/updated 05/23/2015)  No Known Allergies   MEDICATIONS PRIOR TO ADMISSION:(reviewed/updated 05/23/2015)  Prescriptions prior to admission   Medication Sig   ??? lithium carbonate 600 mg capsule Take 600 mg by mouth nightly. Indications: BIPOLAR DISORDER   ??? atenolol (TENORMIN) 25 mg tablet Take 25 mg by mouth daily. Indications: HYPERTENSION ??? amLODIPine (NORVASC) 10 mg tablet Take 10 mg by mouth daily. Indications: HYPERTENSION   ??? haloperidol (HALDOL) 2 mg tablet Take 2 mg by mouth nightly. Indications: BIIPOLAR DISORDER   ??? diphenhydrAMINE (BENADRYL) 25 mg capsule Take 25 mg by mouth nightly.      PAST MEDICAL HISTORY: Past medical history from the initial psychiatric evaluation has been reviewed (reviewed/updated 05/23/2015) with no additional updates (I asked patient and no additional past medical history provided). No past medical history on file.No past surgical history on file.   SOCIAL HISTORY: Social history from the initial psychiatric evaluation has been reviewed (reviewed/updated 05/23/2015) with no additional updates (I asked patient and no additional social history provided).   History     Social History   ??? Marital Status: LEGALLY SEPARATED     Spouse Name: N/A   ??? Number of Children: N/A    ??? Years of Education: N/A     Occupational History   ??? Not on file.     Social History Main Topics   ??? Smoking status: Not on file   ??? Smokeless tobacco: Not on file   ??? Alcohol Use: Not on file   ??? Drug Use: Not on file   ??? Sexual Activity: Not on file     Other Topics Concern   ??? Not on file     Social History Narrative   ??? No narrative on file      FAMILY HISTORY: Family history from the initial psychiatric evaluation has been reviewed (reviewed/updated 05/23/2015) with no additional updates (I asked patient and no additional family history provided).   Family History   Problem Relation Age of Onset   ??? Bipolar Disorder Father      committed suicide       REVIEW OF SYSTEMS: (reviewed/updated 05/23/2015)  Appetite:good   Sleep: good   All other Review of Systems:   negative for - suicidal ideation  Respiratory ROS: no cough, shortness of breath, or wheezing  Cardiovascular ROS: no chest pain or dyspnea on exertion         MENTAL STATUS EXAM & VITALS     MENTAL STATUS EXAM (MSE):    MSE FINDINGS ARE WITHIN NORMAL LIMITS (WNL) UNLESS OTHERWISE STATED BELOW. ( ALL OF THE BELOW CATEGORIES OF THE MSE HAVE BEEN REVIEWED (reviewed  05/23/2015) AND UPDATED AS DEEMED APPROPRIATE )  General Presentation age appropriate, casually dressed and older than stated age, unreliable and vague, very poor eye contact   Orientation oriented to time, place and person   Vital Signs  See below (reviewed 05/23/2015); Vital Signs (BP, Pulse, & Temp) are within normal limits if not listed below.   Gait and Station Stable/steady, no ataxia Musculoskeletal System No extrapyramidal symptoms (EPS); no abnormal muscular movements or Tardive Dyskinesia (TD); muscle strength and tone are within normal limits   Language No aphasia or dysarthria   Speech:  monotone   Thought Processes illogical, wnl rate of thoughts, poor abstract reasoning and computation   Thought Associations disorganized--tangential   Thought Content Delusions, no hallucinations    Suicidal Ideations none   Homicidal Ideations none   Mood:  irritable, euthymic, labile   Affect:  odd demeanor, very poor eye contact , odd/inapprop smiling   Memory recent  adequate   Memory remote:  adequate   Concentration/Attention:  impaired   Fund of Knowledge average   Insight:  limited   Reliability poor   Judgment:  limited          VITALS:     Patient Vitals for the past 24 hrs:   Temp Pulse Resp BP SpO2   05/23/15 1443 - (!) 48 - 124/60 mmHg -   05/23/15 1442 - (!) 47 - 126/58 mmHg -   05/23/15 1440 97 ??F (36.1 ??C) (!) 53 18 139/64 mmHg 98 %   05/23/15 0641 - (!) 59 18 132/72 mmHg -   05/23/15 0639 96.9 ??F (36.1 ??C) (!) 46 18 136/85 mmHg - 05/22/15 1745 - - - 142/48 mmHg -     Wt Readings from Last 3 Encounters:   05/13/15 95.255 kg (210 lb)     Temp Readings from Last 3 Encounters:   05/23/15 97 ??F (36.1 ??C)      BP Readings from Last 3 Encounters:   05/23/15 124/60     Pulse Readings from Last 3 Encounters:   05/23/15 48            DATA     LABORATORY DATA:(reviewed/updated 05/23/2015)  No results found for this or any previous visit (from the past 24 hour(s)).  Lab Results   Component Value Date/Time    VALPROIC ACID 96 05/22/2015 05:07 AM     Lab Results   Component Value Date/Time    LITHIUM 0.91 05/22/2015 05:07 AM      RADIOLOGY REPORTS:(reviewed/updated 05/23/2015)  No results found.       MEDICATIONS     ALL MEDICATIONS:   Current Facility-Administered Medications   Medication Dose Route Frequency   ??? QUEtiapine SR (SEROquel XR) TABLET 200 mg  200 mg Oral QPM   ??? divalproex ER (DEPAKOTE ER) 24 hour tablet 2,000 mg  2,000 mg Oral QHS   ??? lithium carbonate capsule 900 mg  900 mg Oral QHS   ??? atenolol (TENORMIN) tablet 25 mg  25 mg Oral DAILY   ??? amLODIPine (NORVASC) tablet 10 mg  10 mg Oral DAILY   ??? benztropine (COGENTIN) tablet 1 mg  1 mg Oral BID PRN   ??? benztropine (COGENTIN) injection 1 mg  1 mg IntraMUSCular Q12H PRN   ??? LORazepam (ATIVAN) injection 0.5 mg  0.5 mg IntraMUSCular Q4H PRN    ??? LORazepam (ATIVAN) tablet 0.5 mg  0.5 mg Oral Q4H PRN   ??? zolpidem (AMBIEN) tablet 5 mg  5 mg  Oral QHS PRN   ??? acetaminophen (TYLENOL) tablet 650 mg  650 mg Oral Q4H PRN   ??? magnesium hydroxide (MILK OF MAGNESIA) oral suspension 30 mL  30 mL Oral DAILY PRN   ??? nicotine (NICODERM CQ) 21 mg/24 hr patch 1 Patch  1 Patch TransDERmal DAILY PRN      SCHEDULED MEDICATIONS:   Current Facility-Administered Medications   Medication Dose Route Frequency   ??? QUEtiapine SR (SEROquel XR) TABLET 200 mg  200 mg Oral QPM   ??? divalproex ER (DEPAKOTE ER) 24 hour tablet 2,000 mg  2,000 mg Oral QHS   ??? lithium carbonate capsule 900 mg  900 mg Oral QHS   ??? atenolol (TENORMIN) tablet 25 mg  25 mg Oral DAILY   ??? 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   Pt has been decompensated for several months PTA since tx noncompliance.   Plan: I will continue to adjust psych meds. Some c/o sedation and dizziness, will decreased depakote sl.   I will continue to monitor blood levels (depakote and lithium---drugs with a narrow therapeutic index= NTI) and associated labs for drug therapy implemented that require intense monitoring for toxicity as deemed appropriate base on current medication side effects and pharmacodynamically determined drug 1/2 lives.  repeat levels in AM.      Coarse tremors (05/05/2015)    Assessment: stable, improved    Plan: continue to mnoitor     Hypertension (05/05/2015)    Assessment: stable    Plan: monitor. Cont to adjust bp meds    tx noncompliance-- a new dx. Sig decompensations over the last yr.          Complete current electronic health record for patient has been reviewed today including consultant notes, ancillary staff notes, nurses and psychiatric tech notes.      The following regarding medications was addressed during rounds with patient:   the risks and benefits of the proposed medication. The patient was given the opportunity to ask questions. Informed consent given to the use of the above medications. Will continue to adjust psychiatric and non-psychiatric medications (see above "medication" section and orders section for details) as deemed appropriate & based upon diagnoses and response to treatment.     I will continue to order blood tests/labs and diagnostic tests as deemed appropriate and review results as they become available (see orders for details and above listed lab/test results).    I will order psychiatric records from previous psych hospitals to further elucidate the nature of patient's psychopathology and review once available.    I will gather additional collateral information from friends, family and o/p treatment team to further elucidate the nature of patient's psychopathology and baselline level of psychiatric functioning.         I certify that this patient's inpatient psychiatric hospital services furnished since the previous certification were, and continue to be, required for treatment that could reasonably be expected to improve the patient's condition, or for diagnostic study, and that the patient continues to need, on a daily basis, active  treatment furnished directly by or requiring the supervision of inpatient psychiatric facility personnel. In addition, the hospital records show that services furnished were intensive treatment services, admission or related services, or equivalent services.    EXPECTED DISCHARGE DATE/DAY: TBD     DISPOSITION: Home  Psych FU with in [place] ?       Signed By:   Mackie Pai, MD  05/23/2015

## 2015-05-23 NOTE — Progress Notes (Signed)
Spiritual Care Assessment/Progress Notes    Tristyn Demarest [id number]  [id number]    12/06/1945  69 y.o.  male    Patient Telephone Number: [phone] (home)   Religious Affiliation: Other   Language: English   Extended Emergency Contact Information  Primary Emergency Contact: [facility] [place]  Home Phone: [phone]  Mobile Phone: [phone]  Relation: None   Patient Active Problem List    Diagnosis Date Noted   ??? 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 Bipolar disorder with psychotic features (HCC) 05/05/2015   ??? Coarse tremors 05/05/2015   ??? Hypertension 05/05/2015        Date: 05/23/2015       Level of Religious/Spiritual Activity:           Involved in faith tradition/spiritual practice             Not involved in faith tradition/spiritual practice           Spiritually oriented             Claims no spiritual orientation             seeking spiritual identity           Feels alienated from religious practice/tradition           Feels angry about religious practice/tradition           Spirituality/religious tradition  a Theatre stage manager for coping at this time.           Not able to assess due to medical condition    Services Provided Today:           crisis intervention             reading Scriptures           spiritual assessment             prayer           empathic listening/emotional support           rites and rituals (cite in comments)           life review              religious support           theological development            advocacy           ethical dialog              blessing           bereavement support             support to family           anticipatory grief support            help with AMD           spiritual guidance             meditation      Spiritual Care Needs           Emotional Support           Spiritual/Religious Care           Loss/Adjustment           Advocacy/Referral                /Ethics           No needs expressed at  this time            Other: (note in               comments)  Spiritual Care Plan           Follow up visits with               pt/family           Provide materials           Schedule sacraments           Contact Community               Clergy           Follow up as needed           Other: (note in               comments)     Comments: MR. Hughlett is known to me from Spirituality groups.  He shared today his disappointment with having to be her so long.  He hope he is able to go home soon.  Provided gentle listening presence and assurance of prayer.  Visited by: Encarnacion Slateshaplain [facility]  Chaplain Paging Service [phone]

## 2015-05-23 NOTE — Behavioral Health Treatment Team (Cosign Needed)
GROUP THERAPY PROGRESS NOTE    Corey Crawford is participating in [facility].     Group time: 30 minutes    Personal goal for participation: daily orientation    Goal orientation: personal    Group therapy participation: active    Therapeutic interventions reviewed and discussed: yes    Impression of participation: attend    Corey Crawford  05/23/2015

## 2015-05-23 NOTE — Behavioral Health Treatment Team (Cosign Needed)
GROUP THERAPY PROGRESS NOTE    The patient Corey LennoxMarvin Lansberry a 69 y.o. male is participating in Coping Skills Group.     Group time: 45 minutes    Personal goal for participation: To participate in healthy relationships bingo game    Goal orientation:  personal    Group therapy participation: minimal    Therapeutic interventions reviewed and discussed: things pertaining to healthy relationships    Impression of participation:  The patient was present-arrived late    BEVERLY S BAKER  05/23/2015  1:51 PM

## 2015-05-23 NOTE — Progress Notes (Signed)
Problem: Altered Thought Process (Adult/Pediatric)  Goal: *STG: Demonstrates ability to understand and use improved judgment in daily activities and relationships  Client continues to isolate in room this shift.  Did get up for phone call and talked on the phone fairly well, laughing and joking with who ever he was talking to.  No c/o this pm of the meds, but meds were changed this am.  Out for meals and meds, hygiene remains fair, q 15 min checks for safety continue.

## 2015-05-23 NOTE — Progress Notes (Addendum)
Problem: Altered Thought Process (Adult/Pediatric)  Goal: *STG: Decreased hallucinations  Outcome: Progressing Towards Goal  Awake, alert oreinted X3.  No concerns  With B/P today.  Mild mannered/poetic.  Attended treatment team.  Concerned that medications make him "feel drunk".  Abilify discontinued  To start Seroquel lXR this evening.      Change Seroquel IXR to Seroquel XR.

## 2015-05-23 NOTE — Behavioral Health Treatment Team (Cosign Needed)
GROUP THERAPY PROGRESS NOTE    The patient Corey Crawford a 69 y.o. male is participating in Creative Expression Group.     Group time: 1 hour    Personal goal for participation: To concentrate on selected task    Goal orientation: social    Group therapy participation: minimal    Therapeutic interventions reviewed and discussed: Crafts, games, music    Impression of participation: The patient was present-left early    BEVERLY S BAKER  05/23/2015  5:00 PM

## 2015-05-24 MED ORDER — QUETIAPINE SR 300 MG 24 HR TAB
300 mg | Freq: Every evening | ORAL | Status: DC
Start: 2015-05-24 — End: 2015-05-25
  Administered 2015-05-24: 22:00:00 via ORAL

## 2015-05-24 MED FILL — ATENOLOL 50 MG TAB: 50 mg | ORAL | Qty: 1

## 2015-05-24 MED FILL — AMLODIPINE 5 MG TAB: 5 mg | ORAL | Qty: 2

## 2015-05-24 MED FILL — SEROQUEL XR 300 MG TABLET,EXTENDED RELEASE: 300 mg | ORAL | Qty: 1

## 2015-05-24 MED FILL — LITHIUM CARBONATE 300 MG CAP: 300 mg | ORAL | Qty: 3

## 2015-05-24 MED FILL — DEPAKOTE ER 500 MG TABLET,EXTENDED RELEASE: 500 mg | ORAL | Qty: 4

## 2015-05-24 NOTE — Behavioral Health Treatment Team (Cosign Needed)
REFLECTIONS GROUP THERAPY PROGRESS NOTE    The patient Corey Crawford is participating in Reflections Group Therapy.     Group time: 30 minutes    Personal goal for participation: Share with group about feelings and concerns throughout the course of the day.    Goal orientation: personal    Group therapy participation: active    Therapeutic interventions reviewed and discussed: Yes    Impression of participation:   Positive input.    PAULETTE J ALLEN  05/24/2015

## 2015-05-24 NOTE — Behavioral Health Treatment Team (Cosign Needed)
Pt did not attend coping skills group.

## 2015-05-24 NOTE — Progress Notes (Signed)
Problem: Psychosis  Goal: *STG: Remains safe in hospital  Outcome: Progressing Towards Goal  Patient has been resting in their room with their eyes closed and has showed no signs of distress through out the shift. Patient slept for 7 hours. Patient is on every 15 minute checks for safety.

## 2015-05-24 NOTE — Behavioral Health Treatment Team (Cosign Needed)
GROUP THERAPY PROGRESS NOTE    The patient Corey Crawford is participating in Community Group.    Group time: 30 minutes    Personal goal for participation: to orient the patient to the unit.    Goal orientation: successful adoption of unit rules    Group therapy participation: minimal    Therapeutic interventions reviewed and discussed: Yes    Impression of participation: minimal    [name]  05/24/2015 12:47 PM

## 2015-05-24 NOTE — Behavioral Health Treatment Team (Signed)
PSYCHIATRIC PROGRESS NOTE         Patient Name  Corey Crawford   Date of Birth 10-11-1946   CSN [id number]   Medical Record Number  [id number]      Age  69 y.o.   PCP Phys Other, MD   Admit date:  05/04/2015    Room Number  311/02  @ [facility]   Date of Service  05/24/2015          PSYCHOTHERAPY SESSION NOTE:  Length of psychotherapy session: 20 minutes    Main condition/diagnosis/issues treated during session today, 05/24/2015 :  paranoia, mania, disorganization, tx noncompliance    I employed  supportive psychotherapy in regards to various ongoing psychosocial stressors, including the following: pre-admission and current problems; housing issues; medical issues; and stress of hospitalization. Interpersonal relationship issues and psychodynamic conflicts explored.  Attempts made to alleviate maladaptive patterns. We, also, worked on issues of denial & effects of substance dependency/use     Overall, patient is slowly improving today    Treatment Plan Update (reviewed an updated 05/24/2015) :  I will modify psychotherapy tx plan by implementing more stress management strategies, building upon cognitive behavioral techniques, increasing coping skills, as well as shoring up psychological defenses).    An extended energy and skill set was needed to engage pt in psychotherapy due to some of the following: resistiveness, complexity, negativity, confrontational nature, hostile behaviors, and/or severe abnormalities in thought processes/psychosis resulting in the loss of expressive/receptive language communication skills.                            E & M PROGRESS NOTE:         HISTORY       CC:  "can i go home today"  HISTORY OF PRESENT ILLNESS/INTERVAL HISTORY:  (reviewed/updated 05/24/2015).  In summary, Corey Crawford, is a 69 y.o.  male who presents with a severe exacerbation of the principal diagnosis, Bipolar disorder with psychotic features (HCC), which is improving and not stable. Patient requires  continued inpatient hospitalization for further stabilization, safety monitoring and medication management.  I will continue to coordinate the provision of individual, milieu, occupational, group, and substance abuse therapies to address target symptoms/diagnoses as deemed appropriate for the individual patient.  A coordinated, multidisplinary treatment team round was conducted with the patient (this team consists of the nurse, psychiatric unit pharmcist, Administrator).     per initial evaluation: The patient, Corey Crawford, is a 69 y.o.?? WHITE OR CAUCASIAN male with a past psychiatric history significant for chronic mental illness , who presents at this time for a severe exacerbation of the principle diagnosis of Bipolar disorder with psychotic features (HCC). Patient reports/evidences the following emotional symptoms:?? agitation, mania and psychosis.?? Additional symptomatology include agitation, anger outbursts, difficulty sleeping, increased irritability and problem with medication.?? The above symptoms have been present for . These symptoms are of severe in?? severity per patient's report. The symptoms are constant?? fleeting in nature.?? The patient's condition has been precipitated by and psychosocial stressors( multiple losses,death in family,diagnosed with colon cancer ).?? Patient's condition made worse by treatment noncompliance. UDS negative, BAL=0. ??  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     [name] presents/reports/evidences the following emotional symptoms today, 05/24/2015:  paranoia, mania.  The above symptoms have been present for few months, was at a different hosp for 2 weeks just prior to this admission.  These sxs were improving the beginning of last week, since has been regressing. These symptoms are of severe severity. The symptoms are constant in nature. Additional symptomatology include disorganized thought processes and behaviors, labile moods, odd demeanor, decreased grandiose, improved sleep, and no irritability.      SIDE EFFECTS: (reviewed/updated 05/24/2015)  None reported or admitted to.  No noted toxicity with use of lithium or depakote   ALLERGIES:(reviewed/updated 05/24/2015)  No Known Allergies   MEDICATIONS PRIOR TO ADMISSION:(reviewed/updated 05/24/2015)  Prescriptions prior to admission   Medication Sig   ??? lithium carbonate 600 mg capsule Take 600 mg by mouth nightly. Indications: BIPOLAR DISORDER   ??? atenolol (TENORMIN) 25 mg tablet Take 25 mg by mouth daily. Indications: HYPERTENSION   ??? amLODIPine (NORVASC) 10 mg tablet Take 10 mg by mouth daily. Indications: HYPERTENSION   ??? haloperidol (HALDOL) 2 mg tablet Take 2 mg by mouth nightly. Indications: BIIPOLAR DISORDER   ??? diphenhydrAMINE (BENADRYL) 25 mg capsule Take 25 mg by mouth nightly.      PAST MEDICAL HISTORY: Past medical history from the initial psychiatric evaluation has been reviewed (reviewed/updated 05/24/2015) with no additional updates (I asked patient and no additional past medical history provided). No past medical history on file.No past surgical history on file.   SOCIAL HISTORY: Social history from the initial psychiatric evaluation has been reviewed (reviewed/updated 05/24/2015) with no additional updates (I asked patient and no additional social history provided).   History     Social History   ??? Marital Status: LEGALLY SEPARATED     Spouse Name: N/A   ??? Number of Children: N/A   ??? Years of Education: N/A      Occupational History   ??? Not on file.     Social History Main Topics   ??? Smoking status: Not on file   ??? Smokeless tobacco: Not on file   ??? Alcohol Use: Not on file   ??? Drug Use: Not on file   ??? Sexual Activity: Not on file     Other Topics Concern   ??? Not on file     Social History Narrative   ??? No narrative on file      FAMILY HISTORY: Family history from the initial psychiatric evaluation has been reviewed (reviewed/updated 05/24/2015) with no additional updates (I asked patient and no additional family history provided).   Family History   Problem Relation Age of Onset   ??? Bipolar Disorder Father      committed suicide       REVIEW OF SYSTEMS: (reviewed/updated 05/24/2015)  Appetite:good   Sleep: good   All other Review of Systems:   negative for - suicidal ideation  Respiratory ROS: no cough, shortness of breath, or wheezing  Cardiovascular ROS: no chest pain or dyspnea on exertion         MENTAL STATUS EXAM & VITALS     MENTAL STATUS EXAM (MSE):    MSE FINDINGS ARE WITHIN NORMAL LIMITS (WNL) UNLESS OTHERWISE STATED BELOW. ( ALL OF THE BELOW CATEGORIES OF THE MSE HAVE  BEEN REVIEWED (reviewed 05/24/2015) AND UPDATED AS DEEMED APPROPRIATE )  General Presentation age appropriate, casually dressed and older than stated age, unreliable and vague   Orientation oriented to time, place and person   Vital Signs  See below (reviewed 05/24/2015); Vital Signs (BP, Pulse, & Temp) are within normal limits if not listed below.   Gait and Station Stable/steady, no ataxia   Musculoskeletal System No extrapyramidal symptoms (EPS); no abnormal muscular movements or Tardive Dyskinesia (TD); muscle strength and tone are within normal limits   Language No aphasia or dysarthria   Speech:  monotone   Thought Processes illogical, wnl rate of thoughts, poor abstract reasoning and computation   Thought Associations disorganized--tangential   Thought Content Delusions, no hallucinations   Suicidal Ideations none    Homicidal Ideations none Mood:  irritable, euthymic, labile   Affect:  odd demeanor, very poor eye contact , odd/inapprop smiling, occ labile   Memory recent  adequate   Memory remote:  adequate   Concentration/Attention:  impaired   Fund of Knowledge average   Insight:  limited   Reliability poor   Judgment:  limited          VITALS:     Patient Vitals for the past 24 hrs:   Pulse Resp BP   05/24/15 1228 (!) 50 16 128/58 mmHg   05/24/15 0851 60 - 151/73 mmHg     Wt Readings from Last 3 Encounters: 05/13/15 95.255 kg (210 lb)     Temp Readings from Last 3 Encounters:   05/23/15 97 ??F (36.1 ??C)      BP Readings from Last 3 Encounters:   05/24/15 128/58     Pulse Readings from Last 3 Encounters:   05/24/15 50            DATA     LABORATORY DATA:(reviewed/updated 05/24/2015)  No results found for this or any previous visit (from the past 24 hour(s)).  Lab Results   Component Value Date/Time    VALPROIC ACID 96 05/22/2015 05:07 AM     Lab Results   Component Value Date/Time    LITHIUM 0.91 05/22/2015 05:07 AM      RADIOLOGY REPORTS:(reviewed/updated 05/24/2015)  No results found.       MEDICATIONS     ALL MEDICATIONS:   Current Facility-Administered Medications   Medication Dose Route Frequency   ??? QUEtiapine SR (SEROquel XR) tablet 300 mg  300 mg Oral QPM   ??? divalproex ER (DEPAKOTE ER) 24 hour tablet 2,000 mg  2,000 mg Oral QHS   ??? lithium carbonate capsule 900 mg  900 mg Oral QHS   ??? atenolol (TENORMIN) tablet 25 mg  25 mg Oral DAILY   ??? amLODIPine (NORVASC) tablet 10 mg  10 mg Oral DAILY   ??? benztropine (COGENTIN) tablet 1 mg  1 mg Oral BID PRN   ??? benztropine (COGENTIN) injection 1 mg  1 mg IntraMUSCular Q12H PRN   ??? LORazepam (ATIVAN) injection 0.5 mg  0.5 mg IntraMUSCular Q4H PRN   ??? LORazepam (ATIVAN) tablet 0.5 mg  0.5 mg Oral Q4H PRN   ??? zolpidem (AMBIEN) tablet 5 mg  5 mg Oral QHS PRN   ??? acetaminophen (TYLENOL) tablet 650 mg  650 mg Oral Q4H PRN    ??? magnesium hydroxide (MILK OF MAGNESIA) oral suspension 30 mL  30 mL Oral DAILY PRN   ??? nicotine (NICODERM CQ) 21 mg/24 hr patch 1 Patch  1 Patch TransDERmal DAILY PRN  SCHEDULED MEDICATIONS:   Current Facility-Administered Medications   Medication Dose Route Frequency   ??? QUEtiapine SR (SEROquel XR) tablet 300 mg  300 mg Oral QPM   ??? divalproex ER (DEPAKOTE ER) 24 hour tablet 2,000 mg  2,000 mg Oral QHS   ??? lithium carbonate capsule 900 mg  900 mg Oral QHS   ??? atenolol (TENORMIN) tablet 25 mg  25 mg Oral DAILY   ??? 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   Pt has been decompensated for several months PTA since tx noncompliance.   Plan: I will continue to adjust psych meds.   I will continue to monitor blood levels (depakote and lithium---drugs with a narrow therapeutic index= NTI) and associated labs for drug therapy implemented that require intense monitoring for toxicity as deemed appropriate base on current medication side effects and pharmacodynamically determined drug 1/2 lives.  repeat levels in AM.      Coarse tremors (05/05/2015)    Assessment: stable, improved    Plan: continue to mnoitor     Hypertension (05/05/2015)    Assessment: stable    Plan: monitor. Cont to adjust bp meds    tx noncompliance-- a new dx. Sig decompensations over the last yr.         Complete current electronic health record for patient has been reviewed today including consultant notes, ancillary staff notes, nurses and psychiatric tech notes.      The following regarding medications was addressed during rounds with patient:    the risks and benefits of the proposed medication. The patient was given the opportunity to ask questions. Informed consent given to the use of the above medications. Will continue to adjust psychiatric and non-psychiatric medications (see above "medication" section and orders section for details) as deemed appropriate & based upon diagnoses and response to treatment.     I will continue to order blood tests/labs and diagnostic tests as deemed appropriate and review results as they become available (see orders for details and above listed lab/test results).    I will order psychiatric records from previous psych hospitals to further elucidate the nature of patient's psychopathology and review once available.    I will gather additional collateral information from friends, family and o/p treatment team to further elucidate the nature of patient's psychopathology and baselline level of psychiatric functioning.         I certify that this patient's inpatient psychiatric hospital services furnished since the previous certification were, and continue to be, required for treatment that could reasonably be expected to improve the patient's condition, or for diagnostic study, and that the patient continues to need, on a daily basis, active treatment furnished directly by or requiring the supervision of inpatient psychiatric facility personnel. In addition, the hospital records show that services furnished were intensive treatment services, admission or related services, or equivalent services.    EXPECTED DISCHARGE DATE/DAY: TBD     DISPOSITION: Home  Psych FU with in [place] ?       Signed By:   Mackie Pai,  MD  05/24/2015

## 2015-05-24 NOTE — Progress Notes (Signed)
Problem: Altered Thought Process (Adult/Pediatric)  Goal: *STG: Decreased delusional thinking  Outcome: Progressing Towards Goal  Mr. Mcdonagh is  alert, oriented X4; dull affect, labile mood.  He has not expressed any A/V hallucinations; expressed no suicidal or homicidal ideations.  Attended treatment team; shaved and trimmed facial hair.  Cooperative with medication administration and treatment plan.

## 2015-05-24 NOTE — Progress Notes (Signed)
Problem: Psychosis  Goal: *STG: Remains safe in hospital  Client has been in his room most of the pm.  Wanted his meds early this pm and was fussing when he had to waite for them instead of wanting them late.  Still gets fussy at times over nothing.  Isolating in his room, not interacting with any staff or clients.  Meal and med compliant.  Q 15 min checks for safety continue.

## 2015-05-25 MED ORDER — QUETIAPINE SR 200 MG 24 HR TAB
200 mg | Freq: Every evening | ORAL | Status: DC
Start: 2015-05-25 — End: 2015-05-26
  Administered 2015-05-25: 22:00:00 via ORAL

## 2015-05-25 MED FILL — SEROQUEL XR 200 MG TABLET,EXTENDED RELEASE: 200 mg | ORAL | Qty: 2

## 2015-05-25 MED FILL — ZOLPIDEM 5 MG TAB: 5 mg | ORAL | Qty: 1

## 2015-05-25 MED FILL — LITHIUM CARBONATE 300 MG CAP: 300 mg | ORAL | Qty: 3

## 2015-05-25 MED FILL — DEPAKOTE ER 500 MG TABLET,EXTENDED RELEASE: 500 mg | ORAL | Qty: 4

## 2015-05-25 MED FILL — ATENOLOL 50 MG TAB: 50 mg | ORAL | Qty: 1

## 2015-05-25 MED FILL — MAPAP (ACETAMINOPHEN) 325 MG TABLET: 325 mg | ORAL | Qty: 2

## 2015-05-25 MED FILL — AMLODIPINE 5 MG TAB: 5 mg | ORAL | Qty: 2

## 2015-05-25 NOTE — Progress Notes (Signed)
Problem: Psychosis  Goal: *STG: Remains safe in hospital  Outcome: Progressing Towards Goal  The patient was observed lying in bed with eyes closed; the patient didn't appear to be in any distress. The patient had unlabored breathing. The patient slept for 7.5 hours and was safe during the night

## 2015-05-25 NOTE — Behavioral Health Treatment Team (Cosign Needed)
REFLECTIONS GROUP THERAPY PROGRESS NOTE    The patient Corey Crawford is participating in Reflections Group Therapy.     Group time: 30 minutes    Personal goal for participation: Share with group about feelings and concerns throughout the course of the day.    Goal orientation: personal    Group therapy participation: active    Therapeutic interventions reviewed and discussed: Yes    Impression of participation:  Positive input.    PAULETTE J ALLEN  05/25/2015

## 2015-05-25 NOTE — Behavioral Health Treatment Team (Signed)
PSYCHIATRIC PROGRESS NOTE         Patient Name  Corey Crawford   Date of Birth 1946/01/03   CSN [id number]   Medical Record Number  [id number]      Age  69 y.o.   PCP Phys Other, MD   Admit date:  05/04/2015    Room Number  311/02  @ [facility]   Date of Service  05/25/2015          PSYCHOTHERAPY SESSION NOTE:  Length of psychotherapy session: 20 minutes    Main condition/diagnosis/issues treated during session today, 05/25/2015 :  paranoia, mania, disorganization, tx noncompliance    I employed  supportive psychotherapy in regards to various ongoing psychosocial stressors, including the following: pre-admission and current problems; housing issues; medical issues; and stress of hospitalization. Interpersonal relationship issues and psychodynamic conflicts explored.  Attempts made to alleviate maladaptive patterns. We, also, worked on issues of denial & effects of substance dependency/use     Overall, patient is slowly improving     Treatment Plan Update (reviewed an updated 05/25/2015) :  I will modify psychotherapy tx plan by implementing more stress management strategies, building upon cognitive behavioral techniques, increasing coping skills, as well as shoring up psychological defenses).    An extended energy and skill set was needed to engage pt in psychotherapy due to some of the following: resistiveness, complexity, negativity, confrontational nature, hostile behaviors, and/or severe abnormalities in thought processes/psychosis resulting in the loss of expressive/receptive language communication skills.                            E & M PROGRESS NOTE:         HISTORY       CC:  "the meds are working"  HISTORY OF PRESENT ILLNESS/INTERVAL HISTORY:  (reviewed/updated 05/25/2015).  In summary, Corey Crawford, is a 69 y.o.  male who presents with a severe exacerbation of the principal diagnosis, Bipolar disorder with psychotic features (HCC), which is improving and not stable. Patient requires  continued inpatient hospitalization for further stabilization, safety monitoring and medication management.  I will continue to coordinate the provision of individual, milieu, occupational, group, and substance abuse therapies to address target symptoms/diagnoses as deemed appropriate for the individual patient.  A coordinated, multidisplinary treatment team round was conducted with the patient (this team consists of the nurse, psychiatric unit pharmcist, Administratorsocial worker and writer).     per initial evaluation: The patient, Corey Crawford, is a 69 y.o.?? WHITE OR CAUCASIAN male with a past psychiatric history significant for chronic mental illness , who presents at this time for a severe exacerbation of the principle diagnosis of Bipolar disorder with psychotic features (HCC). Patient reports/evidences the following emotional symptoms:?? agitation, mania and psychosis.?? Additional symptomatology include agitation, anger outbursts, difficulty sleeping, increased irritability and problem with medication.?? The above symptoms have been present for . These symptoms are of severe in?? severity per patient's report. The symptoms are constant?? fleeting in nature.?? The patient's condition has been precipitated by and psychosocial stressors( multiple losses,death in family,diagnosed with colon cancer ).?? Patient's condition made worse by treatment noncompliance. UDS negative, BAL=0. ??  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       Corey Crawford presents/reports/evidences the following emotional symptoms today, 05/25/2015:  paranoia, mania.  The above symptoms have been present for few months, was at a different hosp for 2 weeks just prior to this admission.  These sxs were improving the beginning of last week, since has been regressing. These symptoms are of severe severity, sl improved over the last few days. The symptoms are constant in nature. Additional symptomatology include disorganized thought processes and behaviors, labile moods, odd demeanor,  improved sleep, and no irritability.      SIDE EFFECTS: (reviewed/updated 05/25/2015)  None reported or admitted to.  No noted toxicity with use of lithium or depakote   ALLERGIES:(reviewed/updated 05/25/2015)  No Known Allergies   MEDICATIONS PRIOR TO ADMISSION:(reviewed/updated 05/25/2015)  Prescriptions prior to admission   Medication Sig   ??? lithium carbonate 600 mg capsule Take 600 mg by mouth nightly. Indications: BIPOLAR DISORDER   ??? atenolol (TENORMIN) 25 mg tablet Take 25 mg by mouth daily. Indications: HYPERTENSION   ??? amLODIPine (NORVASC) 10 mg tablet Take 10 mg by mouth daily. Indications: HYPERTENSION   ??? haloperidol (HALDOL) 2 mg tablet Take 2 mg by mouth nightly. Indications: BIIPOLAR DISORDER   ??? diphenhydrAMINE (BENADRYL) 25 mg capsule Take 25 mg by mouth nightly.      PAST MEDICAL HISTORY: Past medical history from the initial psychiatric evaluation has been reviewed (reviewed/updated 05/25/2015) with no additional updates (I asked patient and no additional past medical history provided). No past medical history on file.No past surgical history on file.   SOCIAL HISTORY: Social history from the initial psychiatric evaluation has been reviewed (reviewed/updated 05/25/2015) with no additional updates (I asked patient and no additional social history provided).   History     Social History   ??? Marital Status: LEGALLY SEPARATED     Spouse Name: N/A   ??? Number of Children: N/A    ??? Years of Education: N/A     Occupational History   ??? Not on file.     Social History Main Topics   ??? Smoking status: Not on file   ??? Smokeless tobacco: Not on file   ??? Alcohol Use: Not on file   ??? Drug Use: Not on file   ??? Sexual Activity: Not on file     Other Topics Concern   ??? Not on file     Social History Narrative   ??? No narrative on file      FAMILY HISTORY: Family history from the initial psychiatric evaluation has been reviewed (reviewed/updated 05/25/2015) with no additional updates (I asked patient and no additional family history provided).   Family History   Problem Relation Age of Onset   ??? Bipolar Disorder Father      committed suicide       REVIEW OF SYSTEMS: (reviewed/updated 05/25/2015)  Appetite:good   Sleep: good   All other Review of Systems:   Respiratory ROS: no cough, shortness of breath, or wheezing  Cardiovascular ROS: no chest pain or dyspnea on exertion         MENTAL STATUS EXAM & VITALS     MENTAL STATUS EXAM (MSE):    MSE FINDINGS ARE WITHIN NORMAL LIMITS (WNL) UNLESS OTHERWISE STATED BELOW. ( ALL OF THE BELOW CATEGORIES OF THE MSE  HAVE BEEN REVIEWED (reviewed 05/25/2015) AND UPDATED AS DEEMED APPROPRIATE )  General Presentation age appropriate, casually dressed and older than stated age, unreliable and vague   Orientation oriented to time, place and person   Vital Signs  See below (reviewed 05/25/2015); Vital Signs (BP, Pulse, & Temp) are within normal limits if not listed below.   Gait and Station Stable/steady, no ataxia   Musculoskeletal System No extrapyramidal symptoms (EPS); no abnormal muscular movements or Tardive Dyskinesia (TD); muscle strength and tone are within normal limits   Language No aphasia or dysarthria   Speech:  monotone   Thought Processes illogical, wnl rate of thoughts, poor abstract reasoning and computation   Thought Associations disorganized--tangential, very odd in nature   Thought Content Delusions, no hallucinations   Suicidal Ideations none    Homicidal Ideations none   Mood:  irritable, euthymic, labile   Affect:  odd demeanor, poor eye contact , odd/inapprop smiling, occ labile   Memory recent  adequate   Memory remote:  adequate   Concentration/Attention:  impaired   Fund of Knowledge average   Insight:  limited   Reliability poor   Judgment:  limited          VITALS:     Patient Vitals for the past 24 hrs:   Temp Pulse Resp BP   05/25/15 0517 97.8 ??F (36.6 ??C) (!) 52 16 121/44 mmHg     Wt Readings from Last 3 Encounters: 05/13/15 95.255 kg (210 lb)     Temp Readings from Last 3 Encounters:   05/25/15 97.8 ??F (36.6 ??C)      BP Readings from Last 3 Encounters:   05/25/15 121/44     Pulse Readings from Last 3 Encounters:   05/25/15 52            DATA     LABORATORY DATA:(reviewed/updated 05/25/2015)  No results found for this or any previous visit (from the past 24 hour(s)).  Lab Results   Component Value Date/Time    VALPROIC ACID 96 05/22/2015 05:07 AM     Lab Results   Component Value Date/Time    LITHIUM 0.91 05/22/2015 05:07 AM      RADIOLOGY REPORTS:(reviewed/updated 05/25/2015)  No results found.       MEDICATIONS     ALL MEDICATIONS:   Current Facility-Administered Medications   Medication Dose Route Frequency   ??? QUEtiapine SR (SEROquel XR) TABLET 400 mg  400 mg Oral QPM   ??? divalproex ER (DEPAKOTE ER) 24 hour tablet 2,000 mg  2,000 mg Oral QHS   ??? lithium carbonate capsule 900 mg  900 mg Oral QHS   ??? atenolol (TENORMIN) tablet 25 mg  25 mg Oral DAILY   ??? amLODIPine (NORVASC) tablet 10 mg  10 mg Oral DAILY   ??? benztropine (COGENTIN) tablet 1 mg  1 mg Oral BID PRN   ??? benztropine (COGENTIN) injection 1 mg  1 mg IntraMUSCular Q12H PRN   ??? LORazepam (ATIVAN) injection 0.5 mg  0.5 mg IntraMUSCular Q4H PRN   ??? LORazepam (ATIVAN) tablet 0.5 mg  0.5 mg Oral Q4H PRN   ??? zolpidem (AMBIEN) tablet 5 mg  5 mg Oral QHS PRN   ??? acetaminophen (TYLENOL) tablet 650 mg  650 mg Oral Q4H PRN   ??? magnesium hydroxide (MILK OF MAGNESIA) oral suspension 30 mL  30 mL Oral  DAILY PRN   ??? nicotine (NICODERM CQ) 21 mg/24 hr patch 1 Patch  1 Patch TransDERmal DAILY PRN  SCHEDULED MEDICATIONS:   Current Facility-Administered Medications   Medication Dose Route Frequency   ??? QUEtiapine SR (SEROquel XR) TABLET 400 mg  400 mg Oral QPM   ??? divalproex ER (DEPAKOTE ER) 24 hour tablet 2,000 mg  2,000 mg Oral QHS   ??? lithium carbonate capsule 900 mg  900 mg Oral QHS   ??? atenolol (TENORMIN) tablet 25 mg  25 mg Oral DAILY   ??? 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   Pt has been decompensated for several months PTA since tx noncompliance.   Plan: I will continue to adjust psych meds. Now on seroquel xr per pts demands.  I will continue to monitor blood levels (depakote and lithium---drugs with a narrow therapeutic index= NTI) and associated labs for drug therapy implemented that require intense monitoring for toxicity as deemed appropriate base on current medication side effects and pharmacodynamically determined drug 1/2 lives.  repeat levels in AM.      Coarse tremors (05/05/2015)    Assessment: stable, improved    Plan: continue to mnoitor     Hypertension (05/05/2015)    Assessment: stable    Plan: monitor. Cont to adjust bp meds    tx noncompliance-- Sig decompensations over the last yr. Related to such        Complete current electronic health record for patient has been reviewed today including consultant notes, ancillary staff notes, nurses and psychiatric tech notes.      The following regarding medications was addressed during rounds with patient:    the risks and benefits of the proposed medication. The patient was given the opportunity to ask questions. Informed consent given to the use of the above medications. Will continue to adjust psychiatric and non-psychiatric medications (see above "medication" section and orders section for details) as deemed appropriate & based upon diagnoses and response to treatment.     I will continue to order blood tests/labs and diagnostic tests as deemed appropriate and review results as they become available (see orders for details and above listed lab/test results).    I will order psychiatric records from previous psych hospitals to further elucidate the nature of patient's psychopathology and review once available.    I will gather additional collateral information from friends, family and o/p treatment team to further elucidate the nature of patient's psychopathology and baselline level of psychiatric functioning.         I certify that this patient's inpatient psychiatric hospital services furnished since the previous certification were, and continue to be, required for treatment that could reasonably be expected to improve the patient's condition, or for diagnostic study, and that the patient continues to need, on a daily basis, active treatment furnished directly by or requiring the supervision of inpatient psychiatric facility personnel. In addition, the hospital records show that services furnished were intensive treatment services, admission or related services, or equivalent services.    EXPECTED DISCHARGE DATE/DAY: TBD     DISPOSITION: Home  Psych FU with in [place] ?  Signed By:   Mackie Pai, MD  05/25/2015

## 2015-05-25 NOTE — Behavioral Health Treatment Team (Cosign Needed)
Pt isolative out for meals and meds, attended no schedule groups or activities today.  Pt verbalize no self disclosures, no signs of S/I H/I A/H noted or reported.  Pt affect flat and blunt, depress mood, no responding to internal stimuli noted.  Pt remain on Q 15 min checks for safety, will continue to monitor and offer support when needed.

## 2015-05-26 MED ORDER — QUETIAPINE SR 300 MG 24 HR TAB
300 mg | Freq: Every evening | ORAL | Status: DC
Start: 2015-05-26 — End: 2015-05-31
  Administered 2015-05-26 – 2015-05-30 (×5): via ORAL

## 2015-05-26 MED FILL — ZOLPIDEM 5 MG TAB: 5 mg | ORAL | Qty: 1

## 2015-05-26 MED FILL — DEPAKOTE ER 500 MG TABLET,EXTENDED RELEASE: 500 mg | ORAL | Qty: 4

## 2015-05-26 MED FILL — LITHIUM CARBONATE 300 MG CAP: 300 mg | ORAL | Qty: 3

## 2015-05-26 MED FILL — AMLODIPINE 5 MG TAB: 5 mg | ORAL | Qty: 2

## 2015-05-26 MED FILL — ATENOLOL 50 MG TAB: 50 mg | ORAL | Qty: 1

## 2015-05-26 MED FILL — SEROQUEL XR 300 MG TABLET,EXTENDED RELEASE: 300 mg | ORAL | Qty: 2

## 2015-05-26 NOTE — Behavioral Health Treatment Team (Cosign Needed)
GROUP THERAPY PROGRESS NOTE    Corey Crawford is participating in Community.     Group time: 30 minutes    Personal goal for participation: reality orientation    Goal orientation: social    Group therapy participation: passive    Therapeutic interventions reviewed and discussed: yes    Impression of participation: no problem

## 2015-05-26 NOTE — Progress Notes (Signed)
Problem: Psychosis  Goal: *STG: Remains safe in hospital  Outcome: Progressing Towards Goal  Pt rested quietly in bed with eyes closed.  No signs/symptoms of distress, agitation, or anxiety.  Will monitor for changes.  Q 15 minute checks continue. Pt slept 8 hrs

## 2015-05-26 NOTE — Behavioral Health Treatment Team (Signed)
PSYCHIATRIC PROGRESS NOTE         Patient Name  Corey Crawford   Date of Birth February 12, 1946   CSN [id number]   Medical Record Number  [id number]      Age  69 y.o.   PCP Phys Other, MD   Admit date:  05/04/2015    Room Number  311/02  @ [facility]   Date of Service  05/26/2015          PSYCHOTHERAPY SESSION NOTE:  Length of psychotherapy session: 20 minutes    Main condition/diagnosis/issues treated during session today, 05/26/2015 :  paranoia, mania, disorganization, tx noncompliance    I employed  supportive psychotherapy in regards to various ongoing psychosocial stressors, including the following: pre-admission and current problems; housing issues; medical issues; and stress of hospitalization. Interpersonal relationship issues and psychodynamic conflicts explored.  Attempts made to alleviate maladaptive patterns. We, also, worked on issues of denial & effects of substance dependency/use     Overall, patient is slowly improving     Treatment Plan Update (reviewed an updated 05/26/2015) :  I will modify psychotherapy tx plan by implementing more stress management strategies, building upon cognitive behavioral techniques, increasing coping skills, as well as shoring up psychological defenses).    An extended energy and skill set was needed to engage pt in psychotherapy due to some of the following: resistiveness, complexity, negativity, confrontational nature, hostile behaviors, and/or severe abnormalities in thought processes/psychosis resulting in the loss of expressive/receptive language communication skills.                            E & M PROGRESS NOTE:         HISTORY       CC:  "i like the seroquel, slept better"  HISTORY OF PRESENT ILLNESS/INTERVAL HISTORY:  (reviewed/updated 05/26/2015).  In summary, Corey Crawford, is a 69 y.o.  male who presents with a severe exacerbation of the principal diagnosis, Bipolar disorder with psychotic  features (HCC), which is improving and not stable. Patient requires continued inpatient hospitalization for further stabilization, safety monitoring and medication management.  I will continue to coordinate the provision of individual, milieu, occupational, group, and substance abuse therapies to address target symptoms/diagnoses as deemed appropriate for the individual patient.  A coordinated, multidisplinary treatment team round was conducted with the patient (this team consists of the nurse, psychiatric unit pharmcist, Administrator).     per initial evaluation: The patient, Corey Crawford, is a 69 y.o.?? WHITE OR CAUCASIAN male with a past psychiatric history significant for chronic mental illness , who presents at this time for a severe exacerbation of the principle diagnosis of Bipolar disorder with psychotic features (HCC). Patient reports/evidences the following emotional symptoms:?? agitation, mania and psychosis.?? Additional symptomatology include agitation, anger outbursts, difficulty sleeping, increased irritability and problem with medication.?? The above symptoms have been present for . These symptoms are of severe in?? severity per patient's report. The symptoms are constant?? fleeting in nature.?? The patient's condition has been precipitated by and psychosocial stressors( multiple losses,death in family,diagnosed with colon cancer ).?? Patient's condition made worse by treatment noncompliance. UDS negative, BAL=0. ??  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      Corey Crawford presents/reports/evidences the following emotional symptoms today, 05/26/2015:  paranoia, mania, disorganization.  The above symptoms have been present for few months, was at a different hosp for 2 weeks just prior to this admission.  These sxs were improving the beginning of last week, since has been regressing. These symptoms are of severe severity, sl improved over the last week. The symptoms are constant in nature. Additional symptomatology include improved disorganized thought processes and behaviors, labile moods, odd demeanor,  improved sleep, and no irritability.      SIDE EFFECTS: (reviewed/updated 05/26/2015)  None reported or admitted to.  No noted toxicity with use of lithium or depakote   ALLERGIES:(reviewed/updated 05/26/2015)  No Known Allergies   MEDICATIONS PRIOR TO ADMISSION:(reviewed/updated 05/26/2015)  Prescriptions prior to admission Medication Sig   ??? lithium carbonate 600 mg capsule Take 600 mg by mouth nightly. Indications: BIPOLAR DISORDER   ??? atenolol (TENORMIN) 25 mg tablet Take 25 mg by mouth daily. Indications: HYPERTENSION   ??? amLODIPine (NORVASC) 10 mg tablet Take 10 mg by mouth daily. Indications: HYPERTENSION   ??? haloperidol (HALDOL) 2 mg tablet Take 2 mg by mouth nightly. Indications: BIIPOLAR DISORDER   ??? diphenhydrAMINE (BENADRYL) 25 mg capsule Take 25 mg by mouth nightly.    PAST MEDICAL HISTORY: Past medical history from the initial psychiatric evaluation has been reviewed (reviewed/updated 05/26/2015) with no additional updates (I asked patient and no additional past medical history provided). No past medical history on file.No past surgical history on file.   SOCIAL HISTORY: Social history from the initial psychiatric evaluation has been reviewed (reviewed/updated 05/26/2015) with no additional updates (I asked patient and no additional social history provided).   History     Social History    ??? Marital Status: LEGALLY SEPARATED     Spouse Name: N/A   ??? Number of Children: N/A   ??? Years of Education: N/A     Occupational History   ??? Not on file.     Social History Main Topics   ??? Smoking status: Not on file   ??? Smokeless tobacco: Not on file   ??? Alcohol Use: Not on file   ??? Drug Use: Not on file   ??? Sexual Activity: Not on file     Other Topics Concern   ??? Not on file     Social History Narrative   ??? No narrative on file      FAMILY HISTORY: Family history from the initial psychiatric evaluation has been reviewed (reviewed/updated 05/26/2015) with no additional updates (I asked patient and no additional family history provided).   Family History   Problem Relation Age of Onset   ??? Bipolar Disorder Father      committed suicide       REVIEW OF SYSTEMS: (reviewed/updated 05/26/2015)  Appetite:good   Sleep: good   All other Review of Systems:   Respiratory ROS: no cough, shortness of breath, or wheezing  Cardiovascular ROS: no chest pain or dyspnea on exertion         MENTAL STATUS EXAM & VITALS     MENTAL STATUS EXAM (MSE):    MSE FINDINGS ARE WITHIN NORMAL LIMITS (WNL) UNLESS OTHERWISE STATED BELOW. ( ALL OF THE BELOW CATEGORIES OF THE MSE HAVE  BEEN REVIEWED (reviewed 05/26/2015) AND UPDATED AS DEEMED APPROPRIATE )  General Presentation age appropriate, casually dressed and older than stated age, unreliable and vague   Orientation oriented to time, place and person   Vital Signs  See below (reviewed 05/26/2015); Vital Signs (BP, Pulse, & Temp) are within normal limits if not listed below.   Gait and Station Stable/steady, no ataxia   Musculoskeletal System No extrapyramidal symptoms (EPS); no abnormal muscular movements or Tardive Dyskinesia (TD); muscle strength and tone are within normal limits   Language No aphasia or dysarthria   Speech:  monotone   Thought Processes illogical, wnl rate of thoughts, poor abstract reasoning and computation   Thought Associations disorganized--tangential, very odd in nature    Thought Content Delusions, no hallucinations   Suicidal Ideations none   Homicidal Ideations none   Mood:  irritable, euthymic, labile   Affect:  odd demeanor, poor eye contact , euthymic, occ labile   Memory recent  adequate   Memory remote:  adequate   Concentration/Attention:  impaired   Fund of Knowledge average   Insight:  limited   Reliability poor   Judgment:  limited          VITALS:     Patient Vitals for the past 24 hrs:   Temp Pulse Resp BP   05/26/15 0818 - (!) 50 - 145/61 mmHg   05/26/15 0636 98 ??F (36.7 ??C) (!) 52 16 106/63 mmHg     Wt Readings from Last 3 Encounters:   05/13/15 95.255 kg (210 lb)     Temp Readings from Last 3 Encounters:   05/26/15 98 ??F (36.7 ??C)      BP Readings from Last 3 Encounters:   05/26/15 145/61     Pulse Readings from Last 3 Encounters:   05/26/15 50            DATA     LABORATORY DATA:(reviewed/updated 05/26/2015)  No results found for this or any previous visit (from the past 24 hour(s)).  Lab Results   Component Value Date/Time    VALPROIC ACID 96 05/22/2015 05:07 AM     Lab Results   Component Value Date/Time    LITHIUM 0.91 05/22/2015 05:07 AM      RADIOLOGY REPORTS:(reviewed/updated 05/26/2015)  No results found.       MEDICATIONS     ALL MEDICATIONS:   Current Facility-Administered Medications   Medication Dose Route Frequency   ??? QUEtiapine SR (SEROquel XR) tablet 600 mg  600 mg Oral QPM   ??? divalproex ER (DEPAKOTE ER) 24 hour tablet 2,000 mg  2,000 mg Oral QHS   ??? lithium carbonate capsule 900 mg  900 mg Oral QHS   ??? atenolol (TENORMIN) tablet 25 mg  25 mg Oral DAILY   ??? amLODIPine (NORVASC) tablet 10 mg  10 mg Oral DAILY   ??? benztropine (COGENTIN) tablet 1 mg  1 mg Oral BID PRN   ??? benztropine (COGENTIN) injection 1 mg  1 mg IntraMUSCular Q12H PRN   ??? LORazepam (ATIVAN) injection 0.5 mg  0.5 mg IntraMUSCular Q4H PRN   ??? LORazepam (ATIVAN) tablet 0.5 mg  0.5 mg Oral Q4H PRN   ??? zolpidem (AMBIEN) tablet 5 mg  5 mg Oral QHS PRN    ??? acetaminophen (TYLENOL) tablet 650 mg  650 mg Oral Q4H PRN   ??? magnesium hydroxide (MILK OF MAGNESIA) oral suspension 30 mL  30 mL Oral DAILY PRN   ??? nicotine (NICODERM CQ) 21 mg/24  hr patch 1 Patch  1 Patch TransDERmal DAILY PRN      SCHEDULED MEDICATIONS:   Current Facility-Administered Medications   Medication Dose Route Frequency   ??? QUEtiapine SR (SEROquel XR) tablet 600 mg  600 mg Oral QPM   ??? divalproex ER (DEPAKOTE ER) 24 hour tablet 2,000 mg  2,000 mg Oral QHS   ??? lithium carbonate capsule 900 mg  900 mg Oral QHS   ??? atenolol (TENORMIN) tablet 25 mg  25 mg Oral DAILY   ??? amLODIPine (NORVASC) tablet 10 mg  10 mg Oral DAILY          ASSESSMENT & PLAN     DIAGNOSES REQUIRING ACTIVE TREATMENT AND MONITORING: (reviewed/updated 05/26/2015)  Patient Active Hospital Problem List:     Bipolar disorder with psychotic features vs schizoaffective dis, manic type    Assessment:   Mild  change in status over the last week , still quite odd, psychotic, and hypomanic.   Pt has been decompensated for several months PTA since tx noncompliance.   Plan: I will continue to adjust psych meds. Now on seroquel xr per pts demands. titrating doses.   I will continue to monitor blood levels (depakote and lithium---drugs with a narrow therapeutic index= NTI) and associated labs for drug therapy implemented that require intense monitoring for toxicity as deemed appropriate base on current medication side effects and pharmacodynamically determined drug 1/2 lives.  repeat levels in AM.      Coarse tremors (05/05/2015)    Assessment: stable, improved    Plan: continue to mnoitor     Hypertension (05/05/2015)    Assessment: stable    Plan: monitor. Cont to adjust bp meds    tx noncompliance-- Sig decompensations over the last yr. Related to such        Complete current electronic health record for patient has been reviewed today including consultant notes, ancillary staff notes, nurses and psychiatric tech notes.       The following regarding medications was addressed during rounds with patient:   the risks and benefits of the proposed medication. The patient was given the opportunity to ask questions. Informed consent given to the use of the above medications. Will continue to adjust psychiatric and non-psychiatric medications (see above "medication" section and orders section for details) as deemed appropriate & based upon diagnoses and response to treatment.     I will continue to order blood tests/labs and diagnostic tests as deemed appropriate and review results as they become available (see orders for details and above listed lab/test results).    I will order psychiatric records from previous psych hospitals to further elucidate the nature of patient's psychopathology and review once available.    I will gather additional collateral information from friends, family and o/p treatment team to further elucidate the nature of patient's psychopathology and baselline level of psychiatric functioning.         I certify that this patient's inpatient psychiatric hospital services furnished since the previous certification were, and continue to be, required for treatment that could reasonably be expected to improve the patient's condition, or for diagnostic study, and that the patient continues to need, on a daily basis, active treatment furnished directly by or requiring the supervision of inpatient psychiatric facility personnel. In addition, the hospital records show that services furnished were intensive treatment services, admission or related services, or equivalent services.    EXPECTED DISCHARGE DATE/DAY: TBD     DISPOSITION: Home  Psych FU with in  [place] ?       Signed By:   Mackie PaiBruce R Viola Kinnick, MD  05/26/2015

## 2015-05-26 NOTE — Behavioral Health Treatment Team (Cosign Needed)
Pt continue to isolate in his room, compliant with meals and meds.  Pt attended no groups or activities this shift, limited interaction with peers and staff.  Pt confused at times, required redirections and orient to certain things.  Pt affect flat and blunt, depress mood, no S/I H/I A/H noted.  Pt remain on Q 15 min checks for safety, will monitor and offer support when needed.

## 2015-05-27 MED FILL — SEROQUEL XR 300 MG TABLET,EXTENDED RELEASE: 300 mg | ORAL | Qty: 2

## 2015-05-27 MED FILL — LITHIUM CARBONATE 300 MG CAP: 300 mg | ORAL | Qty: 3

## 2015-05-27 MED FILL — MAPAP (ACETAMINOPHEN) 325 MG TABLET: 325 mg | ORAL | Qty: 2

## 2015-05-27 MED FILL — AMLODIPINE 5 MG TAB: 5 mg | ORAL | Qty: 2

## 2015-05-27 MED FILL — DEPAKOTE ER 500 MG TABLET,EXTENDED RELEASE: 500 mg | ORAL | Qty: 4

## 2015-05-27 NOTE — Behavioral Health Treatment Team (Signed)
PSYCHIATRIC PROGRESS NOTE         Patient Name  Corey Crawford   Date of Birth Jun 08, 1946   CSN [id number]   Medical Record Number  [id number]      Age  69 y.o.   PCP Phys Other, MD   Admit date:  05/04/2015    Room Number  311/02  @ [facility]   Date of Service  05/27/2015          PSYCHOTHERAPY SESSION NOTE:  Length of psychotherapy session: 20 minutes    Main condition/diagnosis/issues treated during session today, 05/27/2015 :  paranoia, mania, disorganization, tx noncompliance, home sick    I employed  supportive psychotherapy in regards to various ongoing psychosocial stressors, including the following: pre-admission and current problems; housing issues; medical issues; and stress of hospitalization. Interpersonal relationship issues and psychodynamic conflicts explored.  Attempts made to alleviate maladaptive patterns. We, also, worked on issues of denial & effects of substance dependency/use     Overall, patient is slowly improving     Treatment Plan Update (reviewed an updated 05/27/2015) :  I will modify psychotherapy tx plan by implementing more stress management strategies, building upon cognitive behavioral techniques, increasing coping skills, as well as shoring up psychological defenses).    An extended energy and skill set was needed to engage pt in psychotherapy due to some of the following: resistiveness, complexity, negativity, confrontational nature, hostile behaviors, and/or severe abnormalities in thought processes/psychosis resulting in the loss of expressive/receptive language communication skills.                            E & M PROGRESS NOTE:         HISTORY       CC:  "i like the seroquel, slept better"  HISTORY OF PRESENT ILLNESS/INTERVAL HISTORY:  (reviewed/updated 05/27/2015).  In summary, Corey Crawford, is a 69 y.o.  male who presents with a severe exacerbation of the principal diagnosis, Bipolar disorder with psychotic  features (HCC), which is minimally  improving and not stable. Patient requires continued inpatient hospitalization for further stabilization, safety monitoring and medication management.  I will continue to coordinate the provision of individual, milieu, occupational, group, and substance abuse therapies to address target symptoms/diagnoses as deemed appropriate for the individual patient.  A coordinated, multidisplinary treatment team round was conducted with the patient (this team consists of the nurse, psychiatric unit pharmcist, Administrator).     per initial evaluation: The patient, Corey Crawford, is a 69 y.o.?? WHITE OR CAUCASIAN male with a past psychiatric history significant for chronic mental illness , who presents at this time for a severe exacerbation of the principle diagnosis of Bipolar disorder with psychotic features (HCC). Patient reports/evidences the following emotional symptoms:?? agitation, mania and psychosis.?? Additional symptomatology include agitation, anger outbursts, difficulty sleeping, increased irritability and problem with medication.?? The above symptoms have been present for . These symptoms are of severe in?? severity per patient's report. The symptoms are constant?? fleeting in nature.?? The patient's condition has been precipitated by and psychosocial stressors( multiple losses,death in family,diagnosed with colon cancer ).?? Patient's condition made worse by treatment noncompliance. UDS negative, BAL=0. ??  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      Corey Crawford presents/reports/evidences the following emotional symptoms today, 05/27/2015:  paranoia, disorganization.  The above symptoms have been present for few months, was at a different hosp for 2 weeks just prior to this admission.  These sxs were minimally improving , no sig mood component at this time, mostly just disorganization of thoughts. These symptoms are of severe severity, sl improved over the last week. The symptoms are constant in nature. Additional symptomatology include improved disorganized thought processes and behaviors, labile moods, odd demeanor,  improved sleep, and no irritability.      SIDE EFFECTS: (reviewed/updated 05/27/2015)  None reported or admitted to.  No noted toxicity with use of lithium or depakote   ALLERGIES:(reviewed/updated 05/27/2015)  No Known Allergies   MEDICATIONS PRIOR TO ADMISSION:(reviewed/updated 05/27/2015)  Prescriptions prior to admission   Medication Sig   ??? lithium carbonate 600 mg capsule Take 600 mg by mouth nightly. Indications: BIPOLAR DISORDER   ??? atenolol (TENORMIN) 25 mg tablet Take 25 mg by mouth daily. Indications: HYPERTENSION   ??? amLODIPine (NORVASC) 10 mg tablet Take 10 mg by mouth daily. Indications: HYPERTENSION   ??? haloperidol (HALDOL) 2 mg tablet Take 2 mg by mouth nightly. Indications: BIIPOLAR DISORDER   ??? diphenhydrAMINE (BENADRYL) 25 mg capsule Take 25 mg by mouth nightly.      PAST MEDICAL HISTORY: Past medical history from the initial psychiatric evaluation has been reviewed (reviewed/updated 05/27/2015) with no additional updates (I asked patient and no additional past medical history provided). No past medical history on file.No past surgical history on file.   SOCIAL HISTORY: Social history from the initial psychiatric evaluation has been reviewed (reviewed/updated 05/27/2015) with no additional updates (I asked patient and no additional social history provided).   History      Social History   ??? Marital Status: LEGALLY SEPARATED     Spouse Name: N/A   ??? Number of Children: N/A   ??? Years of Education: N/A     Occupational History   ??? Not on file.     Social History Main Topics   ??? Smoking status: Not on file   ??? Smokeless tobacco: Not on file   ??? Alcohol Use: Not on file   ??? Drug Use: Not on file   ??? Sexual Activity: Not on file     Other Topics Concern   ??? Not on file     Social History Narrative   ??? No narrative on file      FAMILY HISTORY: Family history from the initial psychiatric evaluation has been reviewed (reviewed/updated 05/27/2015) with no additional updates (I asked patient and no additional family history provided).   Family History   Problem Relation Age of Onset   ??? Bipolar Disorder Father      committed suicide       REVIEW OF SYSTEMS: (reviewed/updated 05/27/2015)  Appetite:good   Sleep: good   All other Review of Systems:   Respiratory ROS: no cough, shortness of breath, or wheezing  Cardiovascular ROS: no chest pain or dyspnea on exertion         MENTAL STATUS EXAM & VITALS     MENTAL STATUS EXAM (MSE):    MSE FINDINGS ARE WITHIN NORMAL LIMITS (WNL) UNLESS OTHERWISE  STATED BELOW. ( ALL OF THE BELOW CATEGORIES OF THE MSE HAVE BEEN REVIEWED (reviewed 05/27/2015) AND UPDATED AS DEEMED APPROPRIATE )  General Presentation age appropriate, casually dressed and older than stated age, unreliable and vague   Orientation oriented to time, place and person Vital Signs  See below (reviewed 05/27/2015); Vital Signs (BP, Pulse, & Temp) are within normal limits if not listed below.   Gait and Station Stable/steady, no ataxia   Musculoskeletal System No extrapyramidal symptoms (EPS); no abnormal muscular movements or Tardive Dyskinesia (TD); muscle strength and tone are within normal limits   Language No aphasia or dysarthria   Speech:  monotone   Thought Processes illogical, wnl rate of thoughts, poor abstract reasoning and computation    Thought Associations disorganized--tangential often, very odd in nature   Thought Content Delusions---poorly circumscribed, no hallucinations   Suicidal Ideations none   Homicidal Ideations none   Mood:  irritable, euthymic, labile   Affect:  odd demeanor, poor eye contact , euthymic, occ irritable   Memory recent  adequate   Memory remote:  adequate   Concentration/Attention:  impaired   Fund of Knowledge average   Insight:  limited   Reliability poor   Judgment:  limited          VITALS:     Patient Vitals for the past 24 hrs:   Temp Pulse Resp BP   05/27/15 0606 97.3 ??F (36.3 ??C) (!) 56 16 131/67 mmHg     Wt Readings from Last 3 Encounters:   05/13/15 95.255 kg (210 lb)     Temp Readings from Last 3 Encounters:   05/27/15 97.3 ??F (36.3 ??C)      BP Readings from Last 3 Encounters:   05/27/15 131/67     Pulse Readings from Last 3 Encounters:   05/27/15 56            DATA     LABORATORY DATA:(reviewed/updated 05/27/2015)  No results found for this or any previous visit (from the past 24 hour(s)).  Lab Results   Component Value Date/Time    VALPROIC ACID 96 05/22/2015 05:07 AM     Lab Results   Component Value Date/Time    LITHIUM 0.91 05/22/2015 05:07 AM      RADIOLOGY REPORTS:(reviewed/updated 05/27/2015)  No results found.       MEDICATIONS     ALL MEDICATIONS:   Current Facility-Administered Medications   Medication Dose Route Frequency   ??? QUEtiapine SR (SEROquel XR) tablet 600 mg  600 mg Oral QPM   ??? divalproex ER (DEPAKOTE ER) 24 hour tablet 2,000 mg  2,000 mg Oral QHS   ??? lithium carbonate capsule 900 mg  900 mg Oral QHS   ??? atenolol (TENORMIN) tablet 25 mg  25 mg Oral DAILY   ??? amLODIPine (NORVASC) tablet 10 mg  10 mg Oral DAILY   ??? benztropine (COGENTIN) tablet 1 mg  1 mg Oral BID PRN   ??? benztropine (COGENTIN) injection 1 mg  1 mg IntraMUSCular Q12H PRN   ??? LORazepam (ATIVAN) injection 0.5 mg  0.5 mg IntraMUSCular Q4H PRN   ??? LORazepam (ATIVAN) tablet 0.5 mg  0.5 mg Oral Q4H PRN    ??? zolpidem (AMBIEN) tablet 5 mg  5 mg Oral QHS PRN   ??? acetaminophen (TYLENOL) tablet 650 mg  650 mg Oral Q4H PRN   ??? magnesium hydroxide (MILK OF MAGNESIA) oral suspension 30 mL  30 mL Oral DAILY PRN   ??? nicotine (NICODERM CQ)  21 mg/24 hr patch 1 Patch  1 Patch TransDERmal DAILY PRN      SCHEDULED MEDICATIONS:   Current Facility-Administered Medications   Medication Dose Route Frequency   ??? QUEtiapine SR (SEROquel XR) tablet 600 mg  600 mg Oral QPM ??? divalproex ER (DEPAKOTE ER) 24 hour tablet 2,000 mg  2,000 mg Oral QHS   ??? lithium carbonate capsule 900 mg  900 mg Oral QHS   ??? atenolol (TENORMIN) tablet 25 mg  25 mg Oral DAILY   ??? amLODIPine (NORVASC) tablet 10 mg  10 mg Oral DAILY          ASSESSMENT & PLAN     DIAGNOSES REQUIRING ACTIVE TREATMENT AND MONITORING: (reviewed/updated 05/27/2015)  Patient Active Hospital Problem List:     Bipolar disorder with psychotic features vs schizoaffective dis, manic type    Assessment:   Mild change in status over the last week , still quite odd, psychotic, and hypomanic. Most prominent feature in the last few days has been disorganized thought processes, no dramatic mood lability issues lately  Pt has been decompensated for several months PTA since tx noncompliance.   Plan: I will continue to adjust psych meds. Now on max dose seroquel xr for bipolar per pts demands. titrating doses.   I will continue to monitor blood levels (depakote and lithium---drugs with a narrow therapeutic index= NTI) and associated labs for drug therapy implemented that require intense monitoring for toxicity as deemed appropriate base on current medication side effects and pharmacodynamically determined drug 1/2 lives.  repeat levels in AM.      Coarse tremors (05/05/2015)    Assessment: resolved    Plan: continue to mnoitor     Hypertension (05/05/2015)    Assessment: stable    Plan: monitor. Cont to adjust bp meds    tx noncompliance-- Sig decompensations over the last yr. Related to such         Complete current electronic health record for patient has been reviewed today including consultant notes, ancillary staff notes, nurses and psychiatric tech notes.      The following regarding medications was addressed during rounds with patient:   the risks and benefits of the proposed medication. The patient was given the opportunity to ask questions. Informed consent given to the use of the above medications. Will continue to adjust psychiatric and non-psychiatric medications (see above "medication" section and orders section for details) as deemed appropriate & based upon diagnoses and response to treatment.     I will continue to order blood tests/labs and diagnostic tests as deemed appropriate and review results as they become available (see orders for details and above listed lab/test results).    I will order psychiatric records from previous psych hospitals to further elucidate the nature of patient's psychopathology and review once available.    I will gather additional collateral information from friends, family and o/p treatment team to further elucidate the nature of patient's psychopathology and baselline level of psychiatric functioning.         I certify that this patient's inpatient psychiatric hospital services furnished since the previous certification were, and continue to be, required for treatment that could reasonably be expected to improve the patient's condition, or for diagnostic study, and that the patient continues to need, on a daily basis, active treatment furnished directly by or requiring the supervision of inpatient psychiatric facility personnel. In addition, the hospital records show that services furnished were intensive treatment services, admission or related services, or  equivalent services.    EXPECTED DISCHARGE DATE/DAY: TBD     DISPOSITION: Home  Psych FU with in [place] ?       Signed By:   Mackie Pai, MD  05/27/2015

## 2015-05-27 NOTE — Behavioral Health Treatment Team (Signed)
Patient did not attend process group, held from 3 pm to 330 pm.

## 2015-05-27 NOTE — Progress Notes (Signed)
Problem: Altered Thought Process (Adult/Pediatric)  Goal: *STG: Decreased hallucinations  Outcome: Progressing Towards Goal  Pt resting quietly in bed with eyes closed,  no sign of any distress at this time. Pt will continue to be monitored Q 15 minute for safety and needs. Pt slept about 7 hours.

## 2015-05-27 NOTE — Progress Notes (Signed)
Problem: Psychosis  Goal: *STG: Remains safe in hospital  Outcome: Progressing Towards Goal  Patient remains disorganized. Confused. Medication and meal complaint. Patient denies psychosis. Isolative to self.  Staff will continue to monitor patient q15 saftey checks.

## 2015-05-27 NOTE — Behavioral Health Treatment Team (Cosign Needed)
GROUP THERAPY PROGRESS NOTE    Corey Crawford is participating in [employer].     Group time: 30 minutes    Personal goal for participation:  Unit orientation    Goal orientation: Community    Group therapy participation: active    Therapeutic interventions reviewed and discussed: Yes    Impression of participation: good

## 2015-05-27 NOTE — Progress Notes (Signed)
Problem: Altered Thought Process (Adult/Pediatric)  Goal: *STG: Decreased hallucinations  Outcome: Progressing Towards Goal  Patient is calm, cooperative, visible in the dayroom, interaction with peers. Patient denies AH/VH, denies SI/HI. Compliant to medications. Patient continues to be monitored for safety q 15 minutes checks.

## 2015-05-28 MED FILL — ATENOLOL 50 MG TAB: 50 mg | ORAL | Qty: 1

## 2015-05-28 MED FILL — ZOLPIDEM 5 MG TAB: 5 mg | ORAL | Qty: 1

## 2015-05-28 MED FILL — MAPAP (ACETAMINOPHEN) 325 MG TABLET: 325 mg | ORAL | Qty: 2

## 2015-05-28 MED FILL — SEROQUEL XR 300 MG TABLET,EXTENDED RELEASE: 300 mg | ORAL | Qty: 2

## 2015-05-28 MED FILL — LITHIUM CARBONATE 300 MG CAP: 300 mg | ORAL | Qty: 3

## 2015-05-28 MED FILL — AMLODIPINE 5 MG TAB: 5 mg | ORAL | Qty: 2

## 2015-05-28 MED FILL — DEPAKOTE ER 500 MG TABLET,EXTENDED RELEASE: 500 mg | ORAL | Qty: 4

## 2015-05-28 NOTE — Behavioral Health Treatment Team (Cosign Needed)
GROUP THERAPY PROGRESS NOTE    Corey Crawford is participating in [employer].     Group time: 30 minutes    Personal goal for participation: daily orientation    Goal orientation: personal    Group therapy participation: active    Therapeutic interventions reviewed and discussed: yes    Impression of participation: attend    Corey MassedJanice L. Jackson  05/28/2015

## 2015-05-28 NOTE — Behavioral Health Treatment Team (Cosign Needed)
Pt did not attend coping skills group.

## 2015-05-28 NOTE — Behavioral Health Treatment Team (Signed)
PSYCHIATRIC PROGRESS NOTE         Patient Name  Corey Crawford   Date of Birth December 27, 1945   CSN [id number]   Medical Record Number  [id number]      Age  69 y.o.   PCP Phys Other, MD   Admit date:  05/04/2015    Room Number  311/02  @ [facility]   Date of Service  05/28/2015          PSYCHOTHERAPY SESSION NOTE:  Length of psychotherapy session: 20 minutes    Main condition/diagnosis/issues treated during session today, 05/28/2015 :  paranoia, mania, disorganization, tx noncompliance,  ECT option    I employed  supportive psychotherapy in regards to various ongoing psychosocial stressors, including the following: pre-admission and current problems; housing issues; medical issues; and stress of hospitalization. Interpersonal relationship issues and psychodynamic conflicts explored.  Attempts made to alleviate maladaptive patterns. We, also, worked on issues of denial & effects of substance dependency/use     Overall, patient is slowly/minimally improving     Treatment Plan Update (reviewed an updated 05/28/2015) :  I will modify psychotherapy tx plan by implementing more stress management strategies, building upon cognitive behavioral techniques, increasing coping skills, as well as shoring up psychological defenses).    An extended energy and skill set was needed to engage pt in psychotherapy due to some of the following: resistiveness, complexity, negativity, confrontational nature, hostile behaviors, and/or severe abnormalities in thought processes/psychosis resulting in the loss of expressive/receptive language communication skills.                            E & M PROGRESS NOTE:         HISTORY       CC:  "i've been here 34 days,"  HISTORY OF PRESENT ILLNESS/INTERVAL HISTORY:  (reviewed/updated 05/28/2015).  In summary, Corey Crawford, is a 69 y.o.  male who presents with a severe exacerbation of the principal diagnosis, Bipolar disorder with psychotic  features (HCC), which is minimally  improving and not stable. Patient requires continued inpatient hospitalization for further stabilization, safety monitoring and medication management.  I will continue to coordinate the provision of individual, milieu, occupational, group, and substance abuse therapies to address target symptoms/diagnoses as deemed appropriate for the individual patient.  A coordinated, multidisplinary treatment team round was conducted with the patient (this team consists of the nurse, psychiatric unit pharmcist, Administrator).     per initial evaluation: The patient, Corey Crawford, is a 70 y.o.?? WHITE OR CAUCASIAN male with a past psychiatric history significant for chronic mental illness , who presents at this time for a severe exacerbation of the principle diagnosis of Bipolar disorder with psychotic features (HCC). Patient reports/evidences the following emotional symptoms:?? agitation, mania and psychosis.?? Additional symptomatology include agitation, anger outbursts, difficulty sleeping, increased irritability and problem with medication.?? The abo slept betterve symptoms have been present for . These symptoms are of severe in?? severity per patient's report. The symptoms are constant?? fleeting in nature.?? The patient's condition has been precipitated by and psychosocial stressors( multiple losses,death in family,diagnosed with colon cancer ).?? Patient's condition made worse by treatment noncompliance. UDS negative, BAL=0. ??  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      [name] presents/reports/evidences the following emotional symptoms today, 05/28/2015:  paranoia, disorganization.  The above symptoms have been present for few months, was at a different hosp for 2 weeks just prior to this admission.  These sxs were minimally improving , no sig mood component at this time, mostly just disorganization of thoughts. These symptoms are of severe severity, sl improved over the last week. The symptoms are constant in nature. Additional symptomatology include severely  disorganized thought processes and behaviors, no longer with labile moods, still with odd demeanor, improved sleep, and no irritability.      SIDE EFFECTS: (reviewed/updated 05/28/2015)  None reported or admitted to.  No noted toxicity with use of lithium or depakote   ALLERGIES:(reviewed/updated 05/28/2015)  No Known Allergies   MEDICATIONS PRIOR TO ADMISSION:(reviewed/updated 05/28/2015)  Prescriptions prior to admission   Medication Sig   ??? lithium carbonate 600 mg capsule Take 600 mg by mouth nightly. Indications: BIPOLAR DISORDER   ??? atenolol (TENORMIN) 25 mg tablet Take 25 mg by mouth daily. Indications: HYPERTENSION   ??? amLODIPine (NORVASC) 10 mg tablet Take 10 mg by mouth daily. Indications: HYPERTENSION   ??? haloperidol (HALDOL) 2 mg tablet Take 2 mg by mouth nightly. Indications: BIIPOLAR DISORDER   ??? diphenhydrAMINE (BENADRYL) 25 mg capsule Take 25 mg by mouth nightly.      PAST MEDICAL HISTORY: Past medical history from the initial psychiatric evaluation has been reviewed (reviewed/updated 05/28/2015) with no additional updates (I asked patient and no additional past medical history provided). No past medical history on file.No past surgical history on file.   SOCIAL HISTORY: Social history from the initial psychiatric evaluation has been reviewed (reviewed/updated 05/28/2015) with no additional updates (I  asked patient and no additional social history provided).   History     Social History   ??? Marital Status: LEGALLY SEPARATED     Spouse Name: N/A   ??? Number of Children: N/A   ??? Years of Education: N/A     Occupational History   ??? Not on file.     Social History Main Topics   ??? Smoking status: Not on file   ??? Smokeless tobacco: Not on file   ??? Alcohol Use: Not on file   ??? Drug Use: Not on file   ??? Sexual Activity: Not on file     Other Topics Concern   ??? Not on file     Social History Narrative   ??? No narrative on file      FAMILY HISTORY: Family history from the initial psychiatric evaluation has been reviewed (reviewed/updated 05/28/2015) with no additional updates (I asked patient and no additional family history provided).   Family History   Problem Relation Age of Onset   ??? Bipolar Disorder Father      committed suicide       REVIEW OF SYSTEMS: (reviewed/updated 05/28/2015)  Appetite:good   Sleep: good   All other Review of Systems:   Respiratory ROS: no cough, shortness of breath, or wheezing  Cardiovascular ROS: no chest pain or dyspnea on exertion         MENTAL STATUS EXAM & VITALS     MENTAL STATUS EXAM (MSE):    MSE FINDINGS  ARE WITHIN NORMAL LIMITS (WNL) UNLESS OTHERWISE STATED BELOW. ( ALL OF THE BELOW CATEGORIES OF THE MSE HAVE BEEN REVIEWED (reviewed 05/28/2015) AND UPDATED AS DEEMED APPROPRIATE )  General Presentation age appropriate, casually dressed and older than stated age, unreliable and vague   Orientation oriented to time, place and person   Vital Signs  See below (reviewed 05/28/2015); Vital Signs (BP, Pulse, & Temp) are within normal limits if not listed below.   Gait and Station Stable/steady, no ataxia   Musculoskeletal System No extrapyramidal symptoms (EPS); no abnormal muscular movements or Tardive Dyskinesia (TD); muscle strength and tone are within normal limits   Language No aphasia or dysarthria   Speech:  monotone   Thought Processes illogical, wnl rate of thoughts, poor abstract reasoning  and computation Thought Associations disorganized--tangential often, very odd in nature   Thought Content Delusions---poorly circumscribed, no hallucinations   Suicidal Ideations none   Homicidal Ideations none   Mood:  euthymic, labile   Affect:  odd demeanor, poor eye contact , euthymic   Memory recent  adequate   Memory remote:  adequate   Concentration/Attention:  impaired   Fund of Knowledge average   Insight:  limited   Reliability poor   Judgment:  limited          VITALS:     Patient Vitals for the past 24 hrs:   Temp Pulse Resp BP   05/28/15 0627 97.6 ??F (36.4 ??C) 61 18 129/58 mmHg     Wt Readings from Last 3 Encounters:   05/13/15 95.255 kg (210 lb)     Temp Readings from Last 3 Encounters:   05/28/15 97.6 ??F (36.4 ??C)      BP Readings from Last 3 Encounters:   05/28/15 129/58     Pulse Readings from Last 3 Encounters:   05/28/15 61            DATA     LABORATORY DATA:(reviewed/updated 05/28/2015)  No results found for this or any previous visit (from the past 24 hour(s)).  Lab Results   Component Value Date/Time    VALPROIC ACID 96 05/22/2015 05:07 AM     Lab Results   Component Value Date/Time    LITHIUM 0.91 05/22/2015 05:07 AM      RADIOLOGY REPORTS:(reviewed/updated 05/28/2015)  No results found.       MEDICATIONS     ALL MEDICATIONS:   Current Facility-Administered Medications   Medication Dose Route Frequency   ??? QUEtiapine SR (SEROquel XR) tablet 600 mg  600 mg Oral QPM   ??? divalproex ER (DEPAKOTE ER) 24 hour tablet 2,000 mg  2,000 mg Oral QHS   ??? lithium carbonate capsule 900 mg  900 mg Oral QHS   ??? atenolol (TENORMIN) tablet 25 mg  25 mg Oral DAILY   ??? amLODIPine (NORVASC) tablet 10 mg  10 mg Oral DAILY   ??? benztropine (COGENTIN) tablet 1 mg  1 mg Oral BID PRN   ??? benztropine (COGENTIN) injection 1 mg  1 mg IntraMUSCular Q12H PRN   ??? LORazepam (ATIVAN) injection 0.5 mg  0.5 mg IntraMUSCular Q4H PRN   ??? LORazepam (ATIVAN) tablet 0.5 mg  0.5 mg Oral Q4H PRN    ??? zolpidem (AMBIEN) tablet 5 mg  5 mg Oral QHS PRN   ??? acetaminophen (TYLENOL) tablet 650 mg  650 mg Oral Q4H PRN   ??? magnesium hydroxide (MILK OF MAGNESIA) oral suspension 30 mL  30 mL Oral DAILY PRN   ???  nicotine (NICODERM CQ) 21 mg/24 hr patch 1 Patch  1 Patch TransDERmal DAILY PRN      SCHEDULED MEDICATIONS:   Current Facility-Administered Medications   Medication Dose Route Frequency   ??? QUEtiapine SR (SEROquel XR) tablet 600 mg  600 mg Oral QPM   ??? divalproex ER (DEPAKOTE ER) 24 hour tablet 2,000 mg  2,000 mg Oral QHS   ??? lithium carbonate capsule 900 mg  900 mg Oral QHS   ??? atenolol (TENORMIN) tablet 25 mg  25 mg Oral DAILY   ??? amLODIPine (NORVASC) tablet 10 mg  10 mg Oral DAILY          ASSESSMENT & PLAN     DIAGNOSES REQUIRING ACTIVE TREATMENT AND MONITORING: (reviewed/updated 05/28/2015)  Patient Active Hospital Problem List:     Bipolar disorder with psychotic features vs schizoaffective dis, manic type    Assessment:   Mild change in status over the last week , still quite odd, psychotic, and hypomanic. Most prominent feature in the last few days has been disorganized thought processes, no dramatic mood lability issues lately  Pt has been decompensated for several months PTA since tx noncompliance.   Plan: I will continue to adjust psych meds. Now on max dose seroquel xr for bipolar per pts demands. titrating doses. Pt still refuses to consider ECT tx option.  I will continue to monitor blood levels (depakote and lithium---drugs with a narrow therapeutic index= NTI) and associated labs for drug therapy implemented that require intense monitoring for toxicity as deemed appropriate base on current medication side effects and pharmacodynamically determined drug 1/2 lives.  repeat levels in AM.     Hypertension (05/05/2015)    Assessment: stable    Plan: monitor. Cont to adjust bp meds    tx noncompliance-- Sig decompensations over the last yr. Related to such         Complete current electronic health record for patient has been reviewed today including consultant notes, ancillary staff notes, nurses and psychiatric tech notes.      The following regarding medications was addressed during rounds with patient:   the risks and benefits of the proposed medication. The patient was given the opportunity to ask questions. Informed consent given to the use of the above medications. Will continue to adjust psychiatric and non-psychiatric medications (see above "medication" section and orders section for details) as deemed appropriate & based upon diagnoses and response to treatment.     I will continue to order blood tests/labs and diagnostic tests as deemed appropriate and review results as they become available (see orders for details and above listed lab/test results).    I will order psychiatric records from previous psych hospitals to further elucidate the nature of patient's psychopathology and review once available.    I will gather additional collateral information from friends, family and o/p treatment team to further elucidate the nature of patient's psychopathology and baselline level of psychiatric functioning.         I certify that this patient's inpatient psychiatric hospital services furnished since the previous certification were, and continue to be, required for treatment that could reasonably be expected to improve the patient's condition, or for diagnostic study, and that the patient continues to need, on a daily basis, active treatment furnished directly by or requiring the supervision of inpatient psychiatric facility personnel. In addition, the hospital records show that services furnished were intensive treatment services, admission or related services, or equivalent services.    EXPECTED DISCHARGE DATE/DAY:  TBD     DISPOSITION: ? CSU vs day hospital program  Psych FU with in [place] ?       Signed By:   Mackie Pai, MD  05/28/2015

## 2015-05-28 NOTE — Behavioral Health Treatment Team (Cosign Needed Addendum)
Patient did not attend Community group

## 2015-05-28 NOTE — Progress Notes (Signed)
Problem: Altered Thought Process (Adult/Pediatric)  Goal: *STG: Decreased delusional thinking  Outcome: Progressing Towards Goal  Pt remains visible in the dayroom. He is pleasant upon approach. He remains preoccupied, distracted and disorganized. He continues to remain focused on discharge. His insight and judgement remains impaired. He  remains less manic. Pt did make an inappropriate flirtatious remark to the male nurse but was easily redirected. Pt has been compliant with his meals and medications. Will continue to monitor closely.

## 2015-05-28 NOTE — Behavioral Health Treatment Team (Cosign Needed)
GROUP THERAPY PROGRESS NOTE    The patient Corey LennoxMarvin Cutbirth a 69 y.o. male is participating in Reflections Group    Group time: 30 minutes    Personal goal for participation: To discuss the daily goals    Goal orientation: personal    Group therapy participation: passive    Therapeutic interventions reviewed and discussed:  Yes    Impression of participation: fair    Darrel HooverJOHN E ROBINSON  05/28/2015  10:07 PM

## 2015-05-28 NOTE — Progress Notes (Signed)
Problem: Psychosis  Goal: *STG: Remains safe in hospital  Outcome: Progressing Towards Goal  Pt resting quietly in bed with eyes closed,  no sign of any distress at this time. Pt will continue to be monitored Q 15 minute for safety and needs. Pt slept about 7 hours. Attempted to draw lab this morning but unable to obtain blood.

## 2015-05-28 NOTE — Behavioral Health Treatment Team (Signed)
Patient remains isolative to self mostly. Disorganized and confused. Medication and meal complaint. Patient encouraged to attend groups. Patient denies psychosis. Staff will continue to monitor patient q4317m safety checks.

## 2015-05-29 LAB — VALPROIC ACID: Valproic acid: 112 ug/ml — ABNORMAL HIGH (ref 50–100)

## 2015-05-29 LAB — LITHIUM: Lithium level: 1.02 MMOL/L (ref 0.60–1.20)

## 2015-05-29 MED FILL — SEROQUEL XR 300 MG TABLET,EXTENDED RELEASE: 300 mg | ORAL | Qty: 2

## 2015-05-29 MED FILL — DEPAKOTE ER 500 MG TABLET,EXTENDED RELEASE: 500 mg | ORAL | Qty: 4

## 2015-05-29 MED FILL — LITHIUM CARBONATE 300 MG CAP: 300 mg | ORAL | Qty: 3

## 2015-05-29 MED FILL — AMLODIPINE 5 MG TAB: 5 mg | ORAL | Qty: 2

## 2015-05-29 NOTE — Progress Notes (Signed)
Problem: Altered Thought Process (Adult/Pediatric)  Goal: *STG: Demonstrates ability to understand and use improved judgment in daily activities and relationships  Client continues to isolate in his room.  Would not get up and talk with people who have called, stating he was too tired.  Comes out when needs to otherwise stays to himself.  Meal and med compliant, q 15 min checks for safety continue.

## 2015-05-29 NOTE — Behavioral Health Treatment Team (Signed)
Patient had labs drawn.

## 2015-05-29 NOTE — Behavioral Health Treatment Team (Cosign Needed)
GROUP THERAPY PROGRESS NOTE    The patient Corey Crawford a 69 y.o. male is participating in Reflections Group    Group time: 30 minutes    Personal goal for participation: To discuss the daily goals    Goal orientation: personal    Group therapy participation: passive    Therapeutic interventions reviewed and discussed:  Yes    Impression of participation: poor    Darrel HooverJOHN E ROBINSON  05/29/2015  9:26 PM

## 2015-05-29 NOTE — Behavioral Health Treatment Team (Signed)
PSYCHIATRIC PROGRESS NOTE         Patient Name  Corey Crawford   Date of Birth [birth year]   CSN [id number]   Medical Record Number  [id number]      Age  69 y.o.   PCP Phys Other, MD   Admit date:  05/04/2015    Room Number  311/02  @ [facility]   Date of Service  05/29/2015          PSYCHOTHERAPY SESSION NOTE:  Length of psychotherapy session: 20 minutes    Main condition/diagnosis/issues treated during session today, 05/29/2015 :  disorganization    I employed  supportive psychotherapy in regards to various ongoing psychosocial stressors, including the following: pre-admission and current problems; housing issues; medical issues; and stress of hospitalization. Interpersonal relationship issues and psychodynamic conflicts explored.  Attempts made to alleviate maladaptive patterns. We, also, worked on issues of denial & effects of substance dependency/use     Overall, patient is slowly improving     Treatment Plan Update (reviewed an updated 05/29/2015) :  I will modify psychotherapy tx plan by implementing more stress management strategies, building upon cognitive behavioral techniques, increasing coping skills, as well as shoring up psychological defenses).    An extended energy and skill set was needed to engage pt in psychotherapy due to some of the following: resistiveness, complexity, negativity, confrontational nature, hostile behaviors, and/or severe abnormalities in thought processes/psychosis resulting in the loss of expressive/receptive language communication skills.                            E & M PROGRESS NOTE:         HISTORY       CC:  "i'm ready to go"  HISTORY OF PRESENT ILLNESS/INTERVAL HISTORY:  (reviewed/updated 05/29/2015).  In summary, Corey Crawford, is a 69 y.o.  male who presents with a severe exacerbation of the principal diagnosis, Bipolar disorder with psychotic features (HCC), which is minimally  improving and not stable. Patient  requires continued inpatient hospitalization for further stabilization, safety monitoring and medication management.  I will continue to coordinate the provision of individual, milieu, occupational, group, and substance abuse therapies to address target symptoms/diagnoses as deemed appropriate for the individual patient.  A coordinated, multidisplinary treatment team round was conducted with the patient (this team consists of the nurse, psychiatric unit pharmcist, Administrator).     per initial evaluation: The patient, Corey Crawford, is a 69 y.o.?? WHITE OR CAUCASIAN male with a past psychiatric history significant for chronic mental illness , who presents at this time for a severe exacerbation of the principle diagnosis of Bipolar disorder with psychotic features (HCC). Patient reports/evidences the following emotional symptoms:?? agitation, mania and psychosis.?? Additional symptomatology include agitation, anger outbursts, difficulty sleeping, increased irritability and problem with medication.?? The abo slept betterve symptoms have been present for . These symptoms are of severe in?? severity per patient's report. The symptoms are constant?? fleeting in nature.?? The patient's condition has been precipitated by and psychosocial stressors( multiple losses,death in family,diagnosed with colon cancer ).?? Patient's condition made worse by treatment noncompliance. UDS negative, BAL=0. ??  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       [name] presents/reports/evidences the following emotional symptoms today, 05/29/2015:  Disorganization of thought processes.  The above symptoms have been present for few months, was at a different hosp for 2 weeks just prior to this admission.  These sxs were minimally improving , no sig mood component at this time, mostly just disorganization of thoughts. These symptoms are of severe severity, sl improved over the last week. The symptoms are constant in nature. Additional symptomatology include improvement of paranoia,  labile moods, still with odd demeanor, improved sleep, and no irritability.      SIDE EFFECTS: (reviewed/updated 05/29/2015)  None reported or admitted to.  No noted toxicity with use of lithium or depakote   ALLERGIES:(reviewed/updated 05/29/2015)  No Known Allergies   MEDICATIONS PRIOR TO ADMISSION:(reviewed/updated 05/29/2015)  Prescriptions prior to admission   Medication Sig   ??? lithium carbonate 600 mg capsule Take 600 mg by mouth nightly. Indications: BIPOLAR DISORDER   ??? atenolol (TENORMIN) 25 mg tablet Take 25 mg by mouth daily. Indications: HYPERTENSION   ??? amLODIPine (NORVASC) 10 mg tablet Take 10 mg by mouth daily. Indications: HYPERTENSION   ??? haloperidol (HALDOL) 2 mg tablet Take 2 mg by mouth nightly. Indications: BIIPOLAR DISORDER   ??? diphenhydrAMINE (BENADRYL) 25 mg capsule Take 25 mg by mouth nightly.      PAST MEDICAL HISTORY: Past medical history from the initial psychiatric evaluation has been reviewed (reviewed/updated 05/29/2015) with no additional updates (I asked patient and no additional past medical history provided). No past medical history on file.No past surgical history on file.   SOCIAL HISTORY: Social history from the initial psychiatric evaluation has been reviewed (reviewed/updated 05/29/2015) with no additional updates (I asked patient and no additional social history provided).   History     Social History   ??? Marital Status: LEGALLY SEPARATED     Spouse Name: N/A    ??? Number of Children: N/A   ??? Years of Education: N/A     Occupational History   ??? Not on file.     Social History Main Topics   ??? Smoking status: Not on file   ??? Smokeless tobacco: Not on file   ??? Alcohol Use: Not on file   ??? Drug Use: Not on file   ??? Sexual Activity: Not on file     Other Topics Concern   ??? Not on file     Social History Narrative    [place] Lives by self. On SSDI. No job.      FAMILY HISTORY: Family history from the initial psychiatric evaluation has been reviewed (reviewed/updated 05/29/2015) with no additional updates (I asked patient and no additional family history provided).   Family History   Problem Relation Age of Onset   ??? Bipolar Disorder Father      committed suicide       REVIEW OF SYSTEMS: (reviewed/updated 05/29/2015)  Appetite:good   Sleep: good   All other Review of Systems:   Respiratory ROS: no cough, shortness of breath, or wheezing  Cardiovascular ROS: no chest pain or dyspnea on exertion         MENTAL STATUS EXAM & VITALS     MENTAL STATUS EXAM (MSE):    MSE FINDINGS ARE WITHIN NORMAL LIMITS (WNL) UNLESS  OTHERWISE STATED BELOW. ( ALL OF THE BELOW CATEGORIES OF THE MSE HAVE BEEN REVIEWED (reviewed 05/29/2015) AND UPDATED AS DEEMED APPROPRIATE )  General Presentation age appropriate, casually dressed and older than stated age, unreliable and vague   Orientation oriented to time, place and person   Vital Signs  See below (reviewed 05/29/2015); Vital Signs (BP, Pulse, & Temp) are within normal limits if not listed below.   Gait and Station Stable/steady, no ataxia Musculoskeletal System No extrapyramidal symptoms (EPS); no abnormal muscular movements or Tardive Dyskinesia (TD); muscle strength and tone are within normal limits   Language No aphasia or dysarthria Speech:  monotone   Thought Processes illogical, wnl rate of thoughts, poor abstract reasoning and computation   Thought Associations disorganized--tangential and occ  flight of ideas    Thought Content Delusions---poorly circumscribed, no hallucinations   Suicidal Ideations none   Homicidal Ideations none   Mood:  euthymic, labile   Affect:  odd demeanor, poor eye contact , euthymic   Memory recent  adequate   Memory remote:  adequate   Concentration/Attention:  impaired   Fund of Knowledge average   Insight:  limited   Reliability poor   Judgment:  limited          VITALS:     Patient Vitals for the past 24 hrs:   Temp Pulse Resp BP   05/29/15 0600 97.5 ??F (36.4 ??C) (!) 51 18 123/69 mmHg     Wt Readings from Last 3 Encounters:   05/13/15 95.255 kg (210 lb)     Temp Readings from Last 3 Encounters:   05/29/15 97.5 ??F (36.4 ??C)      BP Readings from Last 3 Encounters:   05/29/15 123/69     Pulse Readings from Last 3 Encounters:   05/29/15 51            DATA     LABORATORY DATA:(reviewed/updated 05/29/2015)  Recent Results (from the past 24 hour(s))   VALPROIC ACID    Collection Time: 05/29/15  5:12 AM   Result Value Ref Range    Valproic acid 112 (H) 50 - 100 ug/ml   LITHIUM    Collection Time: 05/29/15  5:12 AM   Result Value Ref Range    Lithium 1.02 0.60 - 1.20 MMOL/L    Reported dose date: UNKNOWN      Reported dose time: UNKNOWN      Reported dose: UNKNOWN UNITS     Lab Results   Component Value Date/Time    VALPROIC ACID 112 05/29/2015 05:12 AM     Lab Results   Component Value Date/Time    LITHIUM 1.02 05/29/2015 05:12 AM      RADIOLOGY REPORTS:(reviewed/updated 05/29/2015)  No results found.       MEDICATIONS     ALL MEDICATIONS:   Current Facility-Administered Medications   Medication Dose Route Frequency   ??? QUEtiapine SR (SEROquel XR) tablet 600 mg  600 mg Oral QPM   ??? divalproex ER (DEPAKOTE ER) 24 hour tablet 2,000 mg  2,000 mg Oral QHS   ??? lithium carbonate capsule 900 mg  900 mg Oral QHS   ??? atenolol (TENORMIN) tablet 25 mg  25 mg Oral DAILY   ??? amLODIPine (NORVASC) tablet 10 mg  10 mg Oral DAILY   ??? benztropine (COGENTIN) tablet 1 mg  1 mg Oral BID PRN    ??? benztropine (COGENTIN) injection 1 mg  1 mg IntraMUSCular Q12H PRN   ??? LORazepam (ATIVAN)  injection 0.5 mg  0.5 mg IntraMUSCular Q4H PRN   ??? LORazepam (ATIVAN) tablet 0.5 mg  0.5 mg Oral Q4H PRN   ??? zolpidem (AMBIEN) tablet 5 mg  5 mg Oral QHS PRN   ??? acetaminophen (TYLENOL) tablet 650 mg  650 mg Oral Q4H PRN   ??? magnesium hydroxide (MILK OF MAGNESIA) oral suspension 30 mL  30 mL Oral DAILY PRN   ??? nicotine (NICODERM CQ) 21 mg/24 hr patch 1 Patch  1 Patch TransDERmal DAILY PRN      SCHEDULED MEDICATIONS:   Current Facility-Administered Medications   Medication Dose Route Frequency   ??? QUEtiapine SR (SEROquel XR) tablet 600 mg  600 mg Oral QPM   ??? divalproex ER (DEPAKOTE ER) 24 hour tablet 2,000 mg  2,000 mg Oral QHS   ??? lithium carbonate capsule 900 mg  900 mg Oral QHS   ??? atenolol (TENORMIN) tablet 25 mg  25 mg Oral DAILY   ??? amLODIPine (NORVASC) tablet 10 mg  10 mg Oral DAILY          ASSESSMENT & PLAN     DIAGNOSES REQUIRING ACTIVE TREATMENT AND MONITORING: (reviewed/updated 05/29/2015)  Patient Active Hospital Problem List:     Bipolar disorder with psychotic features vs schizoaffective dis, manic type    Assessment:   Mild to moderate changes in status over the last week , still quite odd, psychotic thought processes, but no sig mood component.  Most prominent feature in the last few days has been disorganized thought processes.  Pt has been decompensated for several months PTA since tx noncompliance.   Plan: I will continue to adjust psych meds. Now on max dose seroquel xr for bipolar per pts demands. titrating doses. Pt still refuses to consider ECT tx option.  I will continue to monitor blood levels (depakote and lithium---drugs with a narrow therapeutic index= NTI) and associated labs for drug therapy implemented that require intense monitoring for toxicity as deemed appropriate base on current medication side effects and pharmacodynamically determined drug 1/2 lives.  repeat levels of both wnl today.      Hypertension (05/05/2015)    Assessment: stable    Plan: monitor. Cont to adjust bp meds    tx noncompliance-- Sig decompensations over the last yr. Related to such        Complete current electronic health record for patient has been reviewed today including consultant notes, ancillary staff notes, nurses and psychiatric tech notes.      The following regarding medications was addressed during rounds with patient:   the risks and benefits of the proposed medication. The patient was given the opportunity to ask questions. Informed consent given to the use of the above medications. Will continue to adjust psychiatric and non-psychiatric medications (see above "medication" section and orders section for details) as deemed appropriate & based upon diagnoses and response to treatment.     I will continue to order blood tests/labs and diagnostic tests as deemed appropriate and review results as they become available (see orders for details and above listed lab/test results).    I will order psychiatric records from previous psych hospitals to further elucidate the nature of patient's psychopathology and review once available.    I will gather additional collateral information from friends, family and o/p treatment team to further elucidate the nature of patient's psychopathology and baselline level of psychiatric functioning.         I certify that this patient's inpatient psychiatric hospital services furnished since  the previous certification were, and continue to be, required for treatment that could reasonably be expected to improve the patient's condition, or for diagnostic study, and that the patient continues to need, on a daily basis, active treatment furnished directly by or requiring the supervision of inpatient psychiatric facility personnel. In addition, the hospital records show that services furnished were intensive treatment services, admission or related services, or equivalent services.     EXPECTED DISCHARGE DATE/DAY: Friday      DISPOSITION: ? CSU vs day hospital program  Psych FU with in [place] ?       Signed By:   Mackie Pai, MD  05/29/2015

## 2015-05-29 NOTE — Behavioral Health Treatment Team (Cosign Needed)
Pt did not attend coping skills group.

## 2015-05-29 NOTE — Behavioral Health Treatment Team (Cosign Needed)
GROUP THERAPY PROGRESS NOTE    The patient Corey Crawford a 69 y.o. male is participating in Creative Expression Group.     Group time: 1 hour    Personal goal for participation: To concentrate on selected task    Goal orientation: social    Group therapy participation: active    Therapeutic interventions reviewed and discussed: Crafts, games, music    Impression of participation: The patient was attentive.    BEVERLY S BAKER  05/29/2015  4:50 PM

## 2015-05-29 NOTE — Progress Notes (Signed)
Laboratory monitoring for mood stabilizer and antipsychotics:    Valproic acid = 159mg/ml and lithium = 1.02 mmol/L today (taken at steady state). Doses will remain the same.       The patient is currently taking the following medication(s):   Current Facility-Administered Medications   Medication Dose Route Frequency   ??? QUEtiapine SR (SEROquel XR) tablet 600 mg  600 mg Oral QPM   ??? divalproex ER (DEPAKOTE ER) 24 hour tablet 2,000 mg  2,000 mg Oral QHS   ??? lithium carbonate capsule 900 mg  900 mg Oral QHS   ??? atenolol (TENORMIN) tablet 25 mg  25 mg Oral DAILY   ??? amLODIPine (NORVASC) tablet 10 mg  10 mg Oral DAILY       Serum Drug Level(s)    LITHIUM  Recent Labs      05/29/15   0512   LI  1.02       VALPROIC ACID  Recent Labs      05/29/15   0512   VALP  112*         Renal Function, Hepatic Function and Chemistry  Estimated Creatinine Clearance: 115 mL/min (based on Cr of 0.7).    Lab Results   Component Value Date/Time    SODIUM 141 05/05/2015 06:00 AM    POTASSIUM 3.5 05/05/2015 06:00 AM    CHLORIDE 106 05/05/2015 06:00 AM    CO2 27 05/05/2015 06:00 AM    ANION GAP 8 05/05/2015 06:00 AM    GLUCOSE 130 05/05/2015 06:00 AM    BUN 8 05/05/2015 06:00 AM    CREATININE 0.70 05/05/2015 06:00 AM    BUN/CREATININE RATIO 11 05/05/2015 06:00 AM    GFR EST AA >60 05/05/2015 06:00 AM    GFR EST NON-AA >60 05/05/2015 06:00 AM    CALCIUM 8.3 05/05/2015 06:00 AM    ALT 22 05/22/2015 05:07 AM    AST 11 05/22/2015 05:07 AM    ALK. PHOSPHATASE 68 05/22/2015 05:07 AM    PROTEIN, TOTAL 7.4 05/22/2015 05:07 AM    ALBUMIN 3.3 05/22/2015 05:07 AM    GLOBULIN 4.1 05/22/2015 05:07 AM    A-G RATIO 0.8 05/22/2015 05:07 AM    BILIRUBIN, TOTAL 0.4 05/22/2015 05:07 AM       Lab Results   Component Value Date/Time    GLUCOSE 130 05/05/2015 06:00 AM         Lipids  Lab Results   Component Value Date/Time    CHOLESTEROL, TOTAL 157 05/05/2015 06:00 AM    HDL CHOLESTEROL 41 05/05/2015 06:00 AM    LDL, CALCULATED 102.8 05/05/2015 06:00 AM     TRIGLYCERIDE 66 05/05/2015 06:00 AM    CHOL/HDL RATIO 3.8 05/05/2015 06:00 AM       Thyroid Function    Lab Results   Component Value Date/Time    TSH 0.48 05/05/2015 06:00 AM       Vitals  Visit Vitals   Item Reading   ??? BP 123/69 mmHg   ??? Pulse 51   ??? Temp 97.5 ??F (36.4 ??C)   ??? Resp 18   ??? Ht 175.3 cm (69")   ??? Wt 95.255 kg (210 lb)   ??? BMI 31.00 kg/m2   ??? SpO2 98%         RDonell Beers PharmD, BCPS  [phone](pharmacy)

## 2015-05-29 NOTE — Progress Notes (Signed)
Mariana KaufmanMarvin was relatively quiet during Spirituality Group about Hope on Acute Behavioral Health unit  Chaplain Coletta MemosMartha J. [facility]  Chaplain Paging Service [phone]

## 2015-05-29 NOTE — Progress Notes (Signed)
Problem: Psychosis  Goal: *STG: Remains safe in hospital  Outcome: Progressing Towards Goal  Pt isolative to room this shift.Affect flat.Mood sad.Remains disorganized.Encouraged to participate in groups and socialize.Compliant with meals and medications.No behavior issues.Will continue safety checks and anticipate needs.

## 2015-05-29 NOTE — Behavioral Health Treatment Team (Signed)
GROUP THERAPY PROGRESS NOTE    Corey Crawford is participating in Psychiatric-dealing with schizophrenia    Group time: 25 minutes    Personal goal for participation: Reality Orientation    Goal orientation: personal    Group therapy participation: minimal    Therapeutic interventions reviewed and discussed: yes    Impression of participation: minimal

## 2015-05-29 NOTE — Behavioral Health Treatment Team (Cosign Needed)
Corey Crawford who was absent from [email or web address]    [name]  05/29/2015  10:44 AM

## 2015-05-29 NOTE — Progress Notes (Signed)
Problem: Psychosis  Goal: *STG: Remains safe in hospital  Outcome: Progressing Towards Goal  Patient has been resting in their room with their eyes closed and has showed no signs of distress through out the shift. Patient slept for 7 hours. Patient is on every 15 minute checks for safety.

## 2015-05-30 MED FILL — DEPAKOTE ER 500 MG TABLET,EXTENDED RELEASE: 500 mg | ORAL | Qty: 4

## 2015-05-30 MED FILL — ATENOLOL 50 MG TAB: 50 mg | ORAL | Qty: 1

## 2015-05-30 MED FILL — AMLODIPINE 5 MG TAB: 5 mg | ORAL | Qty: 2

## 2015-05-30 MED FILL — LITHIUM CARBONATE 300 MG CAP: 300 mg | ORAL | Qty: 3

## 2015-05-30 MED FILL — SEROQUEL XR 300 MG TABLET,EXTENDED RELEASE: 300 mg | ORAL | Qty: 2

## 2015-05-30 NOTE — Progress Notes (Signed)
Problem: Altered Thought Process (Adult/Pediatric)  Goal: *STG: Decreased delusional thinking  Outcome: Resolved/Met Date Met:  05/30/15  Patient has been resting in their room with their eyes closed and has showed no signs of distress through out the shift. Patient slept for 7 hours. Patient is on every 15 minute checks for safety.

## 2015-05-30 NOTE — Behavioral Health Treatment Team (Cosign Needed)
Pt did not attend creative expression group.

## 2015-05-30 NOTE — Behavioral Health Treatment Team (Cosign Needed)
GROUP THERAPY PROGRESS NOTE    The patient Corey Crawford a 69 y.o. male is participating in Coping Skills Group.     Group time: 45 minutes    Personal goal for participation: To participate in relaxation activity    Goal orientation:  relaxation    Group therapy participation: active    Therapeutic interventions reviewed and discussed: favorite ways to relax    Impression of participation:  The patient was attentive.    BEVERLY S BAKER  05/30/2015  2:03 PM

## 2015-05-30 NOTE — Behavioral Health Treatment Team (Signed)
PSYCHIATRIC PROGRESS NOTE         Patient Name  Corey Crawford   Date of Birth 29-Aug-1946   CSN [id number]   Medical Record Number  [id number]      Age  69 y.o.   PCP Phys Other, MD   Admit date:  05/04/2015    Room Number  311/02  @ [facility]   Date of Service  05/30/2015          PSYCHOTHERAPY SESSION NOTE:  Length of psychotherapy session: 20 minutes    Main condition/diagnosis/issues treated during session today, 05/30/2015 :  Disorganization, mood    I employed  supportive psychotherapy in regards to various ongoing psychosocial stressors, including the following: pre-admission and current problems; housing issues; medical issues; and stress of hospitalization. Interpersonal relationship issues and psychodynamic conflicts explored.  Attempts made to alleviate maladaptive patterns. We, also, worked on issues of denial & effects of substance dependency/use     Overall, patient is slowly improving     Treatment Plan Update (reviewed an updated 05/30/2015) :  I will modify psychotherapy tx plan by implementing more stress management strategies, building upon cognitive behavioral techniques, increasing coping skills, as well as shoring up psychological defenses).    An extended energy and skill set was needed to engage pt in psychotherapy due to some of the following: resistiveness, complexity, negativity, confrontational nature, hostile behaviors, and/or severe abnormalities in thought processes/psychosis resulting in the loss of expressive/receptive language communication skills.                            E & M PROGRESS NOTE:         HISTORY       CC:  "the meds are fine"  HISTORY OF PRESENT ILLNESS/INTERVAL HISTORY:  (reviewed/updated 05/30/2015).  In summary, Corey Crawford, is a 69 y.o.  male who presents with a severe exacerbation of the principal diagnosis, Bipolar disorder with psychotic features (HCC), which is minimally  improving and not stable. Patient  requires continued inpatient hospitalization for further stabilization, safety monitoring and medication management.  I will continue to coordinate the provision of individual, milieu, occupational, group, and substance abuse therapies to address target symptoms/diagnoses as deemed appropriate for the individual patient.  A coordinated, multidisplinary treatment team round was conducted with the patient (this team consists of the nurse, psychiatric unit pharmcist, Administrator).     per initial evaluation: The patient, Corey Crawford, is a 69 y.o.?? WHITE OR CAUCASIAN male with a past psychiatric history significant for chronic mental illness , who presents at this time for a severe exacerbation of the principle diagnosis of Bipolar disorder with psychotic features (HCC). Patient reports/evidences the following emotional symptoms:?? agitation, mania and psychosis.?? Additional symptomatology include agitation, anger outbursts, difficulty sleeping, increased irritability and problem with medication.?? The abo slept betterve symptoms have been present for . These symptoms are of severe in?? severity per patient's report. The symptoms are constant?? fleeting in nature.?? The patient's condition has been precipitated by and psychosocial stressors( multiple losses,death in family,diagnosed with colon cancer ).?? Patient's condition made worse by treatment noncompliance. UDS negative, BAL=0. ??  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       Corey Crawford presents/reports/evidences the following emotional symptoms today, 05/30/2015:  Disorganization of thought processes.  The above symptoms have been present for few months, was at a different hosp for 2 weeks just prior to this admission.  These sxs were minimally improving , no sig mood component at this time, mostly just disorganization of thoughts. These symptoms are of severe severity, sl improved over the last week. The symptoms are constant in nature. Additional symptomatology include improvement of paranoia, improved labile moods, still with odd demeanor, improved sleep, and no irritability.      SIDE EFFECTS: (reviewed/updated 05/30/2015)  None reported or admitted to.  No noted toxicity with use of lithium or depakote   ALLERGIES:(reviewed/updated 05/30/2015)  No Known Allergies   MEDICATIONS PRIOR TO ADMISSION:(reviewed/updated 05/30/2015)  Prescriptions prior to admission   Medication Sig   ??? lithium carbonate 600 mg capsule Take 600 mg by mouth nightly. Indications: BIPOLAR DISORDER   ??? atenolol (TENORMIN) 25 mg tablet Take 25 mg by mouth daily. Indications: HYPERTENSION   ??? amLODIPine (NORVASC) 10 mg tablet Take 10 mg by mouth daily. Indications: HYPERTENSION   ??? haloperidol (HALDOL) 2 mg tablet Take 2 mg by mouth nightly. Indications: BIIPOLAR DISORDER   ??? diphenhydrAMINE (BENADRYL) 25 mg capsule Take 25 mg by mouth nightly.      PAST MEDICAL HISTORY: Past medical history from the initial psychiatric evaluation has been reviewed (reviewed/updated 05/30/2015) with no additional updates (I asked patient and no additional past medical history provided). No past medical history on file.No past surgical history on file.   SOCIAL HISTORY: Social history from the initial psychiatric evaluation has been reviewed (reviewed/updated 05/30/2015) with no additional updates (I asked patient and no additional social history provided).   History     Social History   ??? Marital Status: LEGALLY SEPARATED      Spouse Name: N/A   ??? Number of Children: N/A   ??? Years of Education: N/A     Occupational History   ??? Not on file.     Social History Main Topics   ??? Smoking status: Not on file   ??? Smokeless tobacco: Not on file   ??? Alcohol Use: Not on file   ??? Drug Use: Not on file   ??? Sexual Activity: Not on file     Other Topics Concern   ??? Not on file     Social History Narrative    [place]ives in [place] Lives by self. On SSDI. No job.      FAMILY HISTORY: Family history from the initial psychiatric evaluation has been reviewed (reviewed/updated 05/30/2015) with no additional updates (I asked patient and no additional family history provided).   Family History   Problem Relation Age of Onset   ??? Bipolar Disorder Father      committed suicide       REVIEW OF SYSTEMS: (reviewed/updated 05/30/2015)  Appetite:good   Sleep: good   All other Review of Systems:   Respiratory ROS: no cough, shortness of breath, or wheezing  Cardiovascular ROS: no chest pain or dyspnea on exertion         MENTAL STATUS EXAM & VITALS     MENTAL STATUS EXAM (MSE):    MSE FINDINGS ARE WITHIN NORMAL LIMITS (WNL)  UNLESS OTHERWISE STATED BELOW. ( ALL OF THE BELOW CATEGORIES OF THE MSE HAVE BEEN REVIEWED (reviewed 05/30/2015) AND UPDATED AS DEEMED APPROPRIATE )  General Presentation age appropriate, casually dressed and older than stated age, unreliable and vague Orientation oriented to time, place and person   Vital Signs  See below (reviewed 05/30/2015); Vital Signs (BP, Pulse, & Temp) are within normal limits if not listed below.   Gait and Station Stable/steady, no ataxia   Musculoskeletal System No extrapyramidal symptoms (EPS); no abnormal muscular movements or Tardive Dyskinesia (TD); muscle strength and tone are within normal limits   Language No aphasia or dysarthria   Speech:  monotone   Thought Processes logical, wnl rate of thoughts, poor abstract reasoning and computation   Thought Associations disorganized--tangential     Thought Content Delusions denied, none expressed,  no hallucinations   Suicidal Ideations none   Homicidal Ideations none   Mood:  Euthymic, much less  labile   Affect:  odd demeanor, poor eye contact , euthymic   Memory recent  adequate   Memory remote:  adequate   Concentration/Attention:  impaired   Fund of Knowledge average   Insight:  limited   Reliability poor   Judgment:  limited          VITALS:     Patient Vitals for the past 24 hrs:   Temp Pulse Resp BP   05/30/15 0803 - 71 - 136/74 mmHg   05/30/15 0624 97.5 ??F (36.4 ??C) 66 16 119/53 mmHg     Wt Readings from Last 3 Encounters:   05/13/15 95.255 kg (210 lb)     Temp Readings from Last 3 Encounters:   05/30/15 97.5 ??F (36.4 ??C)      BP Readings from Last 3 Encounters:   05/30/15 136/74     Pulse Readings from Last 3 Encounters:   05/30/15 71            DATA     LABORATORY DATA:(reviewed/updated 05/30/2015)  No results found for this or any previous visit (from the past 24 hour(s)).  Lab Results   Component Value Date/Time    VALPROIC ACID 112 05/29/2015 05:12 AM     Lab Results   Component Value Date/Time    LITHIUM 1.02 05/29/2015 05:12 AM      RADIOLOGY REPORTS:(reviewed/updated 05/30/2015)  No results found.       MEDICATIONS     ALL MEDICATIONS:   Current Facility-Administered Medications   Medication Dose Route Frequency   ??? QUEtiapine SR (SEROquel XR) tablet 600 mg  600 mg Oral QPM   ??? divalproex ER (DEPAKOTE ER) 24 hour tablet 2,000 mg  2,000 mg Oral QHS   ??? lithium carbonate capsule 900 mg  900 mg Oral QHS   ??? atenolol (TENORMIN) tablet 25 mg  25 mg Oral DAILY   ??? amLODIPine (NORVASC) tablet 10 mg  10 mg Oral DAILY   ??? benztropine (COGENTIN) tablet 1 mg  1 mg Oral BID PRN   ??? benztropine (COGENTIN) injection 1 mg  1 mg IntraMUSCular Q12H PRN   ??? LORazepam (ATIVAN) injection 0.5 mg  0.5 mg IntraMUSCular Q4H PRN   ??? LORazepam (ATIVAN) tablet 0.5 mg  0.5 mg Oral Q4H PRN   ??? zolpidem (AMBIEN) tablet 5 mg  5 mg Oral QHS PRN    ??? acetaminophen (TYLENOL) tablet 650 mg  650 mg Oral Q4H PRN   ??? magnesium hydroxide (MILK OF MAGNESIA) oral suspension 30 mL  30 mL  Oral DAILY PRN   ??? nicotine (NICODERM CQ) 21 mg/24 hr patch 1 Patch  1 Patch TransDERmal DAILY PRN      SCHEDULED MEDICATIONS:   Current Facility-Administered Medications   Medication Dose Route Frequency   ??? QUEtiapine SR (SEROquel XR) tablet 600 mg  600 mg Oral QPM   ??? divalproex ER (DEPAKOTE ER) 24 hour tablet 2,000 mg  2,000 mg Oral QHS   ??? lithium carbonate capsule 900 mg  900 mg Oral QHS   ??? atenolol (TENORMIN) tablet 25 mg  25 mg Oral DAILY   ??? amLODIPine (NORVASC) tablet 10 mg  10 mg Oral DAILY          ASSESSMENT & PLAN     DIAGNOSES REQUIRING ACTIVE TREATMENT AND MONITORING: (reviewed/updated 05/30/2015)  Patient Active Hospital Problem List:     Bipolar disorder with psychotic features vs schizoaffective dis, manic type    Assessment: moderate changes in status over the last few days , still quite odd, psychotic thought processes, but no sig mood component.  Most prominent feature in the last few days has been disorganized thought processes.  Pt has been decompensated for several months PTA since tx noncompliance.   Plan: I will continue to adjust psych meds. Now on max dose seroquel xr for bipolar per pts demands. titrating doses. Pt still refuses to consider ECT tx option.  I will continue to monitor blood levels (depakote and lithium---drugs with a narrow therapeutic index= NTI) and associated labs for drug therapy implemented that require intense monitoring for toxicity as deemed appropriate base on current medication side effects and pharmacodynamically determined drug 1/2 lives.  repeat levels of both wnl today.     Hypertension (05/05/2015)    Assessment: stable    Plan: monitor. Cont to adjust bp meds    tx noncompliance-- Sig decompensations over the last yr. Related to such        Complete current electronic health record for patient has been reviewed  today including consultant notes, ancillary staff notes, nurses and psychiatric tech notes.      The following regarding medications was addressed during rounds with patient:   the risks and benefits of the proposed medication. The patient was given the opportunity to ask questions. Informed consent given to the use of the above medications. Will continue to adjust psychiatric and non-psychiatric medications (see above "medication" section and orders section for details) as deemed appropriate & based upon diagnoses and response to treatment.     I will continue to order blood tests/labs and diagnostic tests as deemed appropriate and review results as they become available (see orders for details and above listed lab/test results).    I will order psychiatric records from previous psych hospitals to further elucidate the nature of patient's psychopathology and review once available.    I will gather additional collateral information from friends, family and o/p treatment team to further elucidate the nature of patient's psychopathology and baselline level of psychiatric functioning.         I certify that this patient's inpatient psychiatric hospital services furnished since the previous certification were, and continue to be, required for treatment that could reasonably be expected to improve the patient's condition, or for diagnostic study, and that the patient continues to need, on a daily basis, active treatment furnished directly by or requiring the supervision of inpatient psychiatric facility personnel. In addition, the hospital records show that services furnished were intensive treatment services, admission or related services, or equivalent  services.    EXPECTED DISCHARGE DATE/DAY: Friday      DISPOSITION: ? CSU vs day hospital program  Psych FU with in [place] ?       Signed By:   Mackie Pai, MD  05/30/2015

## 2015-05-30 NOTE — Progress Notes (Signed)
Problem: Psychosis  Goal: *STG: Decreased hallucinations  Outcome: Progressing Towards Goal  Patient remains disorganized. Mostly isolative to self but visible on the unit at different intervals. Medication and meal complaint. Staff will continue to monitor patient [id number] safety checks.

## 2015-05-30 NOTE — Behavioral Health Treatment Team (Cosign Needed)
GROUP THERAPY PROGRESS NOTE    Corey Crawford is participating in [facility].     Group time: 30 minutes    Personal goal for participation: reality orientation    Goal orientation: social    Group therapy participation: passive    Therapeutic interventions reviewed and discussed: yes    Impression of participation: no problem

## 2015-05-30 NOTE — Progress Notes (Signed)
Problem: Psychosis  Goal: *STG: Decreased hallucinations  Outcome: Progressing Towards Goal  Patient mostly  isolates in room, sleeping between beginning of shift and dinner. Compliant with medications and meals,mood sad,affect flat,little interaction with peers, but periodically comes to dayroom and watches TV. Continue to monitor safety checks, and encourage patient to attend groups.

## 2015-05-31 MED ORDER — QUETIAPINE SR 300 MG 24 HR TAB
300 mg | ORAL_TABLET | Freq: Every evening | ORAL | Status: DC
Start: 2015-05-31 — End: 2017-01-07

## 2015-05-31 MED ORDER — DIVALPROEX 500 MG 24 HR TAB
500 mg | ORAL_TABLET | Freq: Every evening | ORAL | Status: DC
Start: 2015-05-31 — End: 2017-01-07

## 2015-05-31 MED ORDER — AMLODIPINE 10 MG TAB
10 mg | ORAL_TABLET | Freq: Every day | ORAL | Status: AC
Start: 2015-05-31 — End: ?

## 2015-05-31 MED ORDER — ATENOLOL 25 MG TAB
25 mg | ORAL_TABLET | Freq: Every day | ORAL | Status: AC
Start: 2015-05-31 — End: ?

## 2015-05-31 MED ORDER — LITHIUM CARBONATE 300 MG CAP
300 mg | ORAL_CAPSULE | Freq: Every evening | ORAL | Status: DC
Start: 2015-05-31 — End: 2017-01-07

## 2015-05-31 MED FILL — LITHIUM CARBONATE 300 MG CAP: 300 mg | ORAL | Qty: 3

## 2015-05-31 MED FILL — AMLODIPINE 5 MG TAB: 5 mg | ORAL | Qty: 2

## 2015-05-31 MED FILL — ATENOLOL 50 MG TAB: 50 mg | ORAL | Qty: 1

## 2015-05-31 MED FILL — DEPAKOTE ER 500 MG TABLET,EXTENDED RELEASE: 500 mg | ORAL | Qty: 4

## 2015-05-31 NOTE — Behavioral Health Treatment Team (Signed)
Pt is alert and oriented x person, place and time. Pt denies any SI/HI or AV hallucinations. Pt denies any depression. Pt plans to return home with sister. Discharge information/Prescriptions reviewed with patient. Pt verbalizes understanding. Pt's belongings/valuables returned. Pt to be transported home by sister.

## 2015-05-31 NOTE — Behavioral Health Treatment Team (Signed)
Patient is calm, cooperative, has had no behavioral problems, is still fairly tangential in thought, but does not appear to be a harm to himself or others.  The patient exhibits no gross mania or psychosis, however, the patient may have a new baseline, which has some loose thought processes.  The patient was discharged and was transported home by his sister.  He will follow [place] [facility] [place] on Monday 06/03/15 at 9am and continuing care paperwork was faxed.     Montel CulverGretchen Garber, LCSW    [address]  [place], [place] [address]  Ph: [phone]  Fax: [phone]

## 2015-05-31 NOTE — Progress Notes (Signed)
Problem: Psychosis  Goal: *STG: Remains safe in hospital  Outcome: Progressing Towards Goal  Patient has been resting in their room with their eyes closed and has showed no signs of distress through out the shift. Patient slept for 7 hours. Patient is on every 15 minute checks for safety.

## 2015-05-31 NOTE — Discharge Summary (Signed)
PSYCHIATRIC DISCHARGE SUMMARY         IDENTIFICATION:    Patient Name  Corey Crawford   Date of Birth 16-Jan-1946   CSN [id number]   Medical Record Number  [id number]      Age  69 y.o.   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  ICD-9-CM: 298.9         * (Principal)Bipolar disorder with psychotic features ([place]) ICD-10-CM: F31.9  ICD-9-CM: 296.80         Coarse tremors ICD-10-CM: G25.2  ICD-9-CM: 781.0       Hypertension ICD-10-CM: I10  ICD-9-CM: 401.9               Active Hospital Problems    Psychotic disorder      *Bipolar disorder with psychotic features (HCC)      Coarse tremors      Hypertension        DISCHARGE DIAGNOSIS:   Axis I:  SEE ABOVE  Axis II: SEE ABOVE  Axis III: SEE ABOVE  Axis IV:  lack of structure  Axis V:  10 on admission, 55 on discharge      CC & HISTORY OF PRESENT ILLNESS:  "The patient, Corey Crawford, is a 69 y.o.  WHITE OR CAUCASIAN male with a past psychiatric history significant for chronic mental illness , who presents at this time for a severe exacerbation of the principle diagnosis of Bipolar disorder with psychotic features ([place]). Patient reports/evidences the following emotional symptoms:  agitation, mania and psychosis.  Additional symptomatology include agitation, anger outbursts, difficulty sleeping, increased irritability and problem with medication.  The abo slept betterve symptoms have been present for . These symptoms are of severe in  severity per patient's report. The symptoms are constant  fleeting in nature.  The patient's condition has been precipitated by and psychosocial stressors( multiple losses,death in family,diagnosed with  colon cancer ).  Patient's condition made worse by treatment noncompliance. UDS negative, BAL=0.    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 Marital Status: LEGALLY SEPARATED     Spouse Name: N/A   ??? Number of Children: N/A   ??? Years of Education: N/A     Occupational History   ??? Not on file.     Social History Main Topics   ??? Smoking status: Not on file   ???  Smokeless tobacco: Not on file   ??? Alcohol Use: Not on file   ??? Drug Use: Not on file   ??? Sexual Activity: Not on file     Other Topics Concern   ??? Not on file     Social History Narrative    Lives in [place]. Lives by self. On SSDI. No job.      FAMILY HISTORY:   Family History   Problem Relation Age of Onset   ??? Bipolar Disorder Father      committed suicide             HOSPITALIZATION COURSE:    Corey Crawford was admitted to the inpatient psychiatric unit [facility] for acute psychiatric stabilization in regards to symptomatology as described in the HPI above.  While on the unit [facility] was involved in individual, group, occupational and milieu therapy.  Psychiatric medications were adjusted during this hospitalization.   [facility] demonstrated a very slow, but progressive improvement in overall condition.  Please see individual progress notes for more specific details regarding patient's hospitalization course.   At time of discharge, Corey Crawford is without significant problems of  depression or mania, still with abnormal thought processes though. Patient free of suicidal and homicidal ideations (appears to be at very low risk of suicide or homicide) and reports many positive predictive factors in terms of not attempting suicide or homicide. Overall presentation at time of discharge is most consistent with the diagnosis of schizoaffective dis. Patient with request for discharge today. Patient has maximized benefit to be derived from acute inpatient psychiatric treatment.  All members of the treatment team concur with each other in regards to plans for discharge today per patient's request.  Patient and family are aware and in agreement with discharge and discharge plan.    Per my last note:   Bipolar disorder with psychotic features vs schizoaffective dis, manic type    Assessment: moderate changes in status over the last few days , still quite odd, psychotic thought processes, but no sig mood component.  Most prominent feature in the last few days has been disorganized thought processes.  Pt has been decompensated for several months PTA since tx noncompliance.   Plan: I will continue to adjust psych meds. Now on max dose seroquel xr for bipolar per pts demands. titrating doses. Pt still refuses to consider ECT tx option.  I will continue to monitor blood levels (depakote and lithium---drugs with a narrow therapeutic index= NTI) and associated labs for drug therapy implemented that require intense monitoring for toxicity as deemed appropriate base on current medication side effects and pharmacodynamically determined drug 1/2 lives.  repeat levels of both wnl today.    Hypertension (05/05/2015)    Assessment: stable    Plan: monitor. Cont to adjust bp meds    tx noncompliance-- Sig decompensations over the last yr. Related to such         LABS AND IMAGAING:    Labs Reviewed   METABOLIC PANEL, COMPREHENSIVE - Abnormal; Notable for the following:     Glucose 130 (*)      BUN/Creatinine ratio 11 (*)     Calcium 8.3 (*)     Albumin 3.2 (*)     A-G Ratio 0.9 (*)     All other components within normal limits   LIPID PANEL - Abnormal; Notable for the following:     LDL, calculated 102.8 (*)     All other components within normal  limits   CK - Abnormal; Notable for the following:     CK 535 (*)     All other components within normal limits   CK W/ CKMB & INDEX - Abnormal; Notable for the following:     CK 528 (*)     CK - MB 9.9 (*)     All other components within normal limits   HEPATIC FUNCTION PANEL - Abnormal; Notable for the following:     Albumin 3.3 (*)     Globulin 4.1 (*)     A-G Ratio 0.8 (*)     AST 11 (*)     All other components within normal limits   VALPROIC ACID - Abnormal; Notable for the following:     Valproic acid 112 (*)     All other components within normal limits   TSH 3RD GENERATION   TROPONIN I   LITHIUM   LITHIUM   VALPROIC ACID   LITHIUM   VALPROIC ACID   LITHIUM   LITHIUM     Lab Results   Component Value Date/Time    VALPROIC ACID 112 05/29/2015 05:12 AM     Admission on 05/04/2015, Discharged on 05/31/2015   Component Date Value Ref Range Status   ??? Sodium 05/05/2015 141  136 - 145 mmol/L Final   ??? Potassium 05/05/2015 3.5  3.5 - 5.1 mmol/L Final   ??? Chloride 05/05/2015 106  97 - 108 mmol/L Final   ??? CO2 05/05/2015 27  21 - 32 mmol/L Final   ??? Anion gap 05/05/2015 8  5 - 15 mmol/L Final   ??? Glucose 05/05/2015 130* 65 - 100 mg/dL Final   ??? BUN 05/05/2015 8  6 - 20 MG/DL Final   ??? Creatinine 05/05/2015 0.70  0.70 - 1.30 MG/DL Final   ??? BUN/Creatinine ratio 05/05/2015 11* 12 - 20   Final   ??? GFR est AA 05/05/2015 >60  >60 ml/min/1.72m Final   ??? GFR est non-AA 05/05/2015 >60  >60 ml/min/1.731mFinal   ??? Calcium 05/05/2015 8.3* 8.5 - 10.1 MG/DL Final   ??? Bilirubin, total 05/05/2015 0.5  0.2 - 1.0 MG/DL Final   ??? ALT 05/05/2015 44  12 - 78 U/L Final   ??? AST 05/05/2015 32  15 - 37 U/L Final   ??? Alk. phosphatase 05/05/2015 78  45 - 117 U/L Final    ??? Protein, total 05/05/2015 6.6  6.4 - 8.2 g/dL Final   ??? Albumin 05/05/2015 3.2* 3.5 - 5.0 g/dL Final   ??? Globulin 05/05/2015 3.4  2.0 - 4.0 g/dL Final   ??? A-G Ratio 05/05/2015 0.9* 1.1 - 2.2   Final   ??? TSH 05/05/2015 0.48  0.36 - 3.74 uIU/mL Final   ??? LIPID PROFILE 05/05/2015       Final   ??? Cholesterol, total 05/05/2015 157  <200 MG/DL Final   ??? Triglyceride 05/05/2015 66  <150 MG/DL Final   ??? HDL Cholesterol 05/05/2015 41   Final   ??? LDL, calculated 05/05/2015 102.8* 0 - 100 MG/DL Final   ??? VLDL, calculated 05/05/2015 13.2   Final   ??? CHOL/HDL Ratio 05/05/2015 3.8  0 - 5.0   Final   ??? CK 05/05/2015 535* 39 - 308 U/L Final   ??? CK 05/05/2015 528* 39 - 308 U/L Final   ??? CK - MB 05/05/2015 9.9* 0.5 - 3.6 NG/ML Final   ??? CK-MB Index 05/05/2015 1.9  0 - 2.5   Final   ???  Troponin-I, Qt. 05/05/2015 <0.04  <0.05 ng/mL Final   ??? Lithium 05/10/2015 0.62  0.60 - 1.20 MMOL/L Final   ??? Reported dose date: 05/10/2015 UNKNOWN   Final   ??? Reported dose time: 05/10/2015 UNKNOWN   Final   ??? Reported dose: 05/10/2015 UNKNOWN   Final   ??? Lithium 05/12/2015 0.91  0.60 - 1.20 MMOL/L Final   ??? Reported dose date: 05/12/2015 UNKNOWN   Final   ??? Reported dose time: 05/12/2015 UNKNOWN   Final   ??? Reported dose: 05/12/2015 UNKNOWN   Final ??? Valproic acid 05/17/2015 73  50 - 100 ug/ml Final   ??? Lithium 05/17/2015 0.73  0.60 - 1.20 MMOL/L Final   ??? Reported dose date: 05/17/2015 UNKNOWN   Final   ??? Reported dose time: 05/17/2015 UNKNOWN   Final   ??? Reported dose: 05/17/2015 UNKNOWN   Final   ??? Valproic acid 05/22/2015 96  50 - 100 ug/ml Final   ??? Lithium 05/22/2015 0.91  0.60 - 1.20 MMOL/L Final   ??? Reported dose date: 05/22/2015 UNKNOWN   Final   ??? Reported dose time: 05/22/2015 UNKNOWN   Final   ??? Reported dose: 05/22/2015 UNKNOWN   Final   ??? Protein, total 05/22/2015 7.4  6.4 - 8.2 g/dL Final   ??? Albumin 05/22/2015 3.3* 3.5 - 5.0 g/dL Final   ??? Globulin 05/22/2015 4.1* 2.0 - 4.0 g/dL Final   ??? A-G Ratio 05/22/2015 0.8* 1.1 - 2.2   Final    ??? Bilirubin, total 05/22/2015 0.4  0.2 - 1.0 MG/DL Final   ??? Bilirubin, direct 05/22/2015 0.1  0.0 - 0.2 MG/DL Final ??? Alk. phosphatase 05/22/2015 68  45 - 117 U/L Final   ??? AST 05/22/2015 11* 15 - 37 U/L Final   ??? ALT 05/22/2015 22  12 - 78 U/L Final   ??? Valproic acid 05/29/2015 112* 50 - 100 ug/ml Final   ??? Lithium 05/29/2015 1.02  0.60 - 1.20 MMOL/L Final   ??? Reported dose date: 05/29/2015 UNKNOWN   Final   ??? Reported dose time: 05/29/2015 UNKNOWN   Final   ??? Reported dose: 05/29/2015 UNKNOWN   Final     No results found.                DISPOSITION:    Home. Patient to f/u with psychiatric, and psychotherapy appointments. Patient is to f/u with internist as directed.  Patient should have a depakote and lithium level and associated labs checked within the next 1-2 weeks by patient's o/p psychiatrist/internist.               FOLLOW-UP CARE:    Activity as tolerated  Resume previous diet  Wound Care: none needed.  Follow-up Information     Follow up With Details Comments [place]  Please follow up with [facility] on Monday 06/03/15 at [facility] your insurance [place] [address]  Ph: [phone]  Fax: [phone]                 PROGNOSIS:   Guarded / Poor---- based on nature of patient's pathology/ies and treatment compliance issues.  Prognosis is greatly dependent upon patient's ability to follow up with psychiatric/psychotherapy appointments as well as to comply with psychiatric medications as prescribed.            DISCHARGE MEDICATIONS:     Informed consent given for the  use of following psychotropic medications:  Current Discharge Medication List      START taking these medications    Details   divalproex ER (DEPAKOTE ER) 500 mg ER tablet Take 4 Tabs by mouth nightly. Indications: scizoaffective dis  Qty: 56 Tab, Refills: 0      QUEtiapine SR (SEROQUEL XR) 300 mg sr tablet Take 2 Tabs by mouth every  evening. Indications: BIPOLAR DISORDER, SCHIZOPHRENIA  Qty: 28 Tab, Refills: 1         CONTINUE these medications which have CHANGED    Details   amLODIPine (NORVASC) 10 mg tablet Take 1 Tab by mouth daily. Indications: HYPERTENSION  Qty: 14 Tab, Refills: 1      atenolol (TENORMIN) 25 mg tablet Take 1 Tab by mouth daily. Indications: HYPERTENSION  Qty: 14 Tab, Refills: 1      lithium carbonate 300 mg capsule Take 3 Caps by mouth nightly. Indications: BIPOLAR DISORDER  Qty: 42 Cap, Refills: 1         STOP taking these medications       haloperidol (HALDOL) 2 mg tablet Comments:   Reason for Stopping:         diphenhydrAMINE (BENADRYL) 25 mg capsule Comments:   Reason for Stopping:                      A coordinated, multidisplinary treatment team round was conducted with Arna Snipe is done daily here at [facility]. This team consists of the nurse, psychiatric unit pharmcist, Education officer, museum and Probation officer.     I have spent greater than 35 minutes on discharge work.    Signed:  Leandra Kern, MD  05/31/2015

## 2015-07-31 ENCOUNTER — Encounter: Payer: Self-pay | Admitting: Emergency Medicine

## 2015-07-31 ENCOUNTER — Emergency Department: Payer: Medicare Other

## 2015-07-31 ENCOUNTER — Encounter: Payer: Self-pay | Admitting: Behavioral Health

## 2015-07-31 ENCOUNTER — Inpatient Hospital Stay
Admission: EM | Admit: 2015-07-31 | Discharge: 2015-08-19 | DRG: 885 | Disposition: A | Discharge status: Home / Self Care | Payer: Medicare Other | Source: Intra-hospital | Attending: Psychiatry | Admitting: Psychiatry

## 2015-07-31 ENCOUNTER — Emergency Department
Admission: EM | Admit: 2015-07-31 | Discharge: 2015-07-31 | Disposition: A | Discharge status: Other Acute Inpatient Hospital | Payer: Medicare Other | Attending: Emergency Medicine | Admitting: Emergency Medicine

## 2015-07-31 DIAGNOSIS — G8929 Other chronic pain: Secondary | ICD-10-CM | POA: Diagnosis present

## 2015-07-31 DIAGNOSIS — F419 Anxiety disorder, unspecified: Secondary | ICD-10-CM | POA: Diagnosis present

## 2015-07-31 DIAGNOSIS — Z79899 Other long term (current) drug therapy: Secondary | ICD-10-CM | POA: Diagnosis not present

## 2015-07-31 DIAGNOSIS — R45851 Suicidal ideations: Secondary | ICD-10-CM | POA: Diagnosis present

## 2015-07-31 DIAGNOSIS — F315 Bipolar disorder, current episode depressed, severe, with psychotic features: Principal | ICD-10-CM | POA: Diagnosis present

## 2015-07-31 DIAGNOSIS — E785 Hyperlipidemia, unspecified: Secondary | ICD-10-CM | POA: Diagnosis present

## 2015-07-31 DIAGNOSIS — R440 Auditory hallucinations: Secondary | ICD-10-CM | POA: Diagnosis present

## 2015-07-31 DIAGNOSIS — M545 Low back pain: Secondary | ICD-10-CM | POA: Diagnosis present

## 2015-07-31 DIAGNOSIS — G2581 Restless legs syndrome: Secondary | ICD-10-CM | POA: Diagnosis present

## 2015-07-31 DIAGNOSIS — I10 Essential (primary) hypertension: Secondary | ICD-10-CM | POA: Diagnosis present

## 2015-07-31 DIAGNOSIS — F3130 Bipolar disorder, current episode depressed, mild or moderate severity, unspecified: Secondary | ICD-10-CM | POA: Diagnosis present

## 2015-07-31 DIAGNOSIS — G629 Polyneuropathy, unspecified: Secondary | ICD-10-CM | POA: Diagnosis present

## 2015-07-31 DIAGNOSIS — R4182 Altered mental status, unspecified: Secondary | ICD-10-CM | POA: Insufficient documentation

## 2015-07-31 DIAGNOSIS — W19XXXA Unspecified fall, initial encounter: Secondary | ICD-10-CM | POA: Diagnosis present

## 2015-07-31 DIAGNOSIS — E8881 Metabolic syndrome: Secondary | ICD-10-CM | POA: Diagnosis present

## 2015-07-31 DIAGNOSIS — Z820 Family history of epilepsy and other diseases of the nervous system: Secondary | ICD-10-CM

## 2015-07-31 DIAGNOSIS — G20 Parkinson's disease: Secondary | ICD-10-CM | POA: Diagnosis present

## 2015-07-31 DIAGNOSIS — G250 Essential tremor: Secondary | ICD-10-CM | POA: Diagnosis present

## 2015-07-31 DIAGNOSIS — Z9119 Patient's noncompliance with other medical treatment and regimen: Secondary | ICD-10-CM | POA: Diagnosis present

## 2015-07-31 DIAGNOSIS — I639 Cerebral infarction, unspecified: Secondary | ICD-10-CM

## 2015-07-31 DIAGNOSIS — G4700 Insomnia, unspecified: Secondary | ICD-10-CM | POA: Diagnosis present

## 2015-07-31 DIAGNOSIS — I679 Cerebrovascular disease, unspecified: Secondary | ICD-10-CM | POA: Diagnosis present

## 2015-07-31 DIAGNOSIS — Z9049 Acquired absence of other specified parts of digestive tract: Secondary | ICD-10-CM | POA: Diagnosis present

## 2015-07-31 DIAGNOSIS — F319 Bipolar disorder, unspecified: Secondary | ICD-10-CM | POA: Diagnosis present

## 2015-07-31 HISTORY — DX: Polyneuropathy, unspecified: G62.9

## 2015-07-31 HISTORY — DX: Parkinson's disease: G20

## 2015-07-31 HISTORY — DX: Cerebrovascular disease, unspecified: I67.9

## 2015-07-31 LAB — COMPREHENSIVE METABOLIC PANEL
ALBUMIN: 4.2 g/dL (ref 3.5–5.0)
ALK PHOS: 73 U/L (ref 38–126)
ALT: 30 U/L (ref 17–63)
AST: 26 U/L (ref 15–41)
Anion gap: 9 (ref 5–15)
BUN: 10 mg/dL (ref 6–20)
CALCIUM: 9.5 mg/dL (ref 8.9–10.3)
CO2: 25 mmol/L (ref 22–32)
CREATININE: 0.75 mg/dL (ref 0.61–1.24)
Chloride: 107 mmol/L (ref 101–111)
GFR calc Af Amer: >60 mL/min (ref >60)
GFR calc non Af Amer: >60 mL/min (ref >60)
GLUCOSE: 116 mg/dL — AB (ref 65–99)
Potassium: 3.6 mmol/L (ref 3.5–5.1)
SODIUM: 141 mmol/L (ref 135–145)
Total Bilirubin: 0.6 mg/dL (ref 0.3–1.2)
Total Protein: 7.3 g/dL (ref 6.5–8.1)

## 2015-07-31 LAB — CBC WITH DIFFERENTIAL/PLATELET
BASOS PCT: 1 %
Basophils Absolute: 0.1 10*3/uL (ref 0–0.1)
EOS ABS: 0.1 10*3/uL (ref 0–0.7)
Eosinophils Relative: 2 %
HCT: 41.8 % (ref 40.0–52.0)
Hemoglobin: 14.2 g/dL (ref 13.0–18.0)
Lymphocytes Relative: 18 %
Lymphs Abs: 1.5 10*3/uL (ref 1.0–3.6)
MCH: 32.2 pg (ref 26.0–34.0)
MCHC: 34.1 g/dL (ref 32.0–36.0)
MCV: 94.4 fL (ref 80.0–100.0)
MONOS PCT: 8 %
Monocytes Absolute: 0.7 10*3/uL (ref 0.2–1.0)
Neutro Abs: 6.2 10*3/uL (ref 1.4–6.5)
Neutrophils Relative %: 71 %
Platelets: 244 10*3/uL (ref 150–440)
RBC: 4.42 MIL/uL (ref 4.40–5.90)
RDW: 14 % (ref 11.5–14.5)
WBC: 8.7 10*3/uL (ref 3.8–10.6)

## 2015-07-31 LAB — TSH: TSH: 1.396 u[IU]/mL (ref 0.350–4.500)

## 2015-07-31 LAB — URINE DRUG SCREEN, QUALITATIVE (ARMC ONLY)
AMPHETAMINES, UR SCREEN: NOT DETECTED
Barbiturates, Ur Screen: NOT DETECTED
Benzodiazepine, Ur Scrn: NOT DETECTED
CANNABINOID 50 NG, UR ~~LOC~~: NOT DETECTED
COCAINE METABOLITE, UR ~~LOC~~: NOT DETECTED
MDMA (ECSTASY) UR SCREEN: NOT DETECTED
Methadone Scn, Ur: NOT DETECTED
Opiate, Ur Screen: NOT DETECTED
PHENCYCLIDINE (PCP) UR S: NOT DETECTED
Tricyclic, Ur Screen: NOT DETECTED

## 2015-07-31 LAB — LITHIUM LEVEL: Lithium Lvl: <0.06 mmol/L — ABNORMAL LOW (ref 0.60–1.20)

## 2015-07-31 LAB — TROPONIN I

## 2015-07-31 LAB — ETHANOL

## 2015-07-31 MED ORDER — LORAZEPAM 2 MG/ML IJ SOLN
1.0000 mg | Freq: Once | INTRAMUSCULAR | Status: AC
  Administered 2015-07-31: 1 mg via INTRAVENOUS
  Filled 2015-07-31: qty 1

## 2015-07-31 MED ORDER — ALUM & MAG HYDROXIDE-SIMETH 200-200-20 MG/5ML PO SUSP: 30.0000 mL | ORAL | Status: DC | PRN

## 2015-07-31 MED ORDER — AMLODIPINE BESYLATE 10 MG PO TABS
10.0000 mg | ORAL_TABLET | Freq: Every day | ORAL | Status: DC
  Administered 2015-08-01 – 2015-08-19 (×19): 10 mg via ORAL
  Filled 2015-07-31 (×20): qty 1

## 2015-07-31 MED ORDER — LITHIUM CARBONATE ER 300 MG PO TBCR: 600.0000 mg | EXTENDED_RELEASE_TABLET | Freq: Every day | ORAL | Status: DC

## 2015-07-31 MED ORDER — AMLODIPINE BESYLATE 5 MG PO TABS
10.0000 mg | ORAL_TABLET | Freq: Every day | ORAL | Status: DC
  Administered 2015-07-31: 10 mg via ORAL
  Filled 2015-07-31: qty 2

## 2015-07-31 MED ORDER — MAGNESIUM HYDROXIDE 400 MG/5ML PO SUSP: 30.0000 mL | Freq: Every day | ORAL | Status: DC | PRN

## 2015-07-31 MED ORDER — ATENOLOL 25 MG PO TABS
25.0000 mg | ORAL_TABLET | Freq: Every day | ORAL | Status: DC
  Administered 2015-07-31: 25 mg via ORAL
  Filled 2015-07-31: qty 1

## 2015-07-31 MED ORDER — LITHIUM CARBONATE ER 300 MG PO TBCR
600.0000 mg | EXTENDED_RELEASE_TABLET | Freq: Every day | ORAL | Status: DC
  Administered 2015-07-31 – 2015-08-18 (×19): 600 mg via ORAL
  Filled 2015-07-31 (×20): qty 2

## 2015-07-31 MED ORDER — INFLUENZA VAC SPLIT QUAD 0.5 ML IM SUSY
0.5000 mL | PREFILLED_SYRINGE | INTRAMUSCULAR | Status: AC
  Administered 2015-08-01: 0.5 mL via INTRAMUSCULAR

## 2015-07-31 MED ORDER — SODIUM CHLORIDE 0.9 % IV BOLUS (SEPSIS)
500.0000 mL | Freq: Once | INTRAVENOUS | Status: AC
  Administered 2015-07-31: 500 mL via INTRAVENOUS

## 2015-07-31 MED ORDER — ATENOLOL 25 MG PO TABS
25.0000 mg | ORAL_TABLET | Freq: Every day | ORAL | Status: DC
  Administered 2015-08-01: 25 mg via ORAL
  Filled 2015-07-31 (×2): qty 1

## 2015-07-31 MED ORDER — HALOPERIDOL 0.5 MG PO TABS: 2.0000 mg | ORAL_TABLET | Freq: Every day | ORAL | Status: DC

## 2015-07-31 MED ORDER — IBUPROFEN 600 MG PO TABS
600.0000 mg | ORAL_TABLET | Freq: Once | ORAL | Status: AC
  Administered 2015-07-31: 600 mg via ORAL
  Filled 2015-07-31: qty 1

## 2015-07-31 MED ORDER — LORAZEPAM 2 MG PO TABS
2.0000 mg | ORAL_TABLET | ORAL | Status: DC | PRN
Start: 2015-07-31 — End: 2015-08-01
  Administered 2015-07-31: 2 mg via ORAL
  Filled 2015-07-31: qty 1

## 2015-07-31 MED ORDER — HALOPERIDOL 2 MG PO TABS
2.0000 mg | ORAL_TABLET | Freq: Every day | ORAL | Status: DC
  Administered 2015-07-31: 2 mg via ORAL
  Filled 2015-07-31: qty 1

## 2015-07-31 MED ORDER — ACETAMINOPHEN 325 MG PO TABS
650.0000 mg | ORAL_TABLET | Freq: Four times a day (QID) | ORAL | Status: DC | PRN
  Administered 2015-08-04 – 2015-08-18 (×12): 650 mg via ORAL
  Filled 2015-07-31 (×13): qty 2

## 2015-07-31 MED ORDER — QUETIAPINE FUMARATE 100 MG PO TABS
100.0000 mg | ORAL_TABLET | Freq: Once | ORAL | Status: AC
  Administered 2015-07-31: 100 mg via ORAL
  Filled 2015-07-31: qty 1

## 2015-07-31 MED ORDER — PNEUMOCOCCAL VAC POLYVALENT 25 MCG/0.5ML IJ INJ
0.5000 mL | INJECTION | INTRAMUSCULAR | Status: AC
  Administered 2015-08-01: 0.5 mL via INTRAMUSCULAR
  Filled 2015-07-31: qty 0.5
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
## 2015-07-31 NOTE — ED Notes (Signed)
1230- patient to CT scan.  AAOx3.  Skin warm and dry.  NAD

## 2015-07-31 NOTE — ED Notes (Signed)
Patient ate 100% of lunch tray independently

## 2015-07-31 NOTE — ED Notes (Signed)
BEHAVIORAL HEALTH ROUNDING Patient sleeping:  no  Patient alert and oriented: yes  Behavior appropriate:   yes  Nutrition and fluids offered:   Yes  Toileting and hygiene offered:   Yes  Sitter present: na   Law enforcement present: no

## 2015-07-31 NOTE — ED Notes (Signed)
BEHAVIORAL HEALTH ROUNDING Patient sleeping:  No   Patient alert and oriented: yes  Behavior appropriate: yes   Nutrition and fluids offered: Yes  Toileting and hygiene offered: Yes   Sitter present:  NA  Law enforcement present: No   

## 2015-07-31 NOTE — ED Notes (Signed)
Patient dressed out into purple scrubs.  Behavioral health at bedside to evaluate patient.  Patient ambulated to bathroom well independently

## 2015-07-31 NOTE — ED Notes (Signed)
AAOx3/  Skin warm and dry.  NAD. D/C to Behavioral Health I P care.

## 2015-07-31 NOTE — BHH Counselor (Signed)
Pt. is to be admitted to [facility] by Dr. Toni Amend. Attending, Physician will be Dr. Kathrene Bongo. Pt. has been assigned to room 310, by [facility] Charge Nurse [place]. Intake Paper Work has been signed and placed on pt. chart. ER staff [facility] Dr. ; Aundra Millet Patient's Nurse & Bradly Chris. Patient Access) have been made aware of the admission.   07/31/2015 Cheryl Flash, NCC, LPC

## 2015-07-31 NOTE — ED Notes (Signed)
Psychiatry consult called to [facility] x [phone].  Dr. Darlin Drop notified and will evaluate patient.

## 2015-07-31 NOTE — Tx Team (Signed)
Initial Interdisciplinary Treatment Plan   PATIENT STRESSORS: Health problems Medication change or noncompliance   PATIENT STRENGTHS: Average or above average intelligence Capable of independent living Supportive family/friends   PROBLEM LIST: Problem List/Patient Goals Date to be addressed Date deferred Reason deferred Estimated date of resolution  Suicidality 07/31/2015   08/06/2015  Depression 07/31/2015   08/06/2015  Parkinson's Disease 07/31/2015   08/06/2015                                       DISCHARGE CRITERIA:  Improved stabilization in mood, thinking, and/or behavior Medical problems require only outpatient monitoring Need for constant or close observation no longer present Reduction of life-threatening or endangering symptoms to within safe limits Verbal commitment to aftercare and medication compliance  PRELIMINARY DISCHARGE PLAN: Outpatient therapy Return to previous living arrangement  PATIENT/FAMIILY INVOLVEMENT: This treatment plan has been presented to and reviewed with the patient, Kyle [name], and/or family member.  The patient and family have been given the opportunity to ask questions and make suggestions.  [name] Shari Prows 07/31/2015, 11:16 PM

## 2015-07-31 NOTE — Progress Notes (Signed)
This is a 69 year old male admitted with suicidal ideation. Pt reports that he feels helpless and hopeless since being diagnosed with Parkinson's disease in May of this year. He has an essential tremor in both hands and struggles with ADL's at home. Pt has been non-compliant with medications and states that he wants to die, although he did contract for safety on admission. Pt mood is depressed/anxious and his affect is sad. He also complains of insomnia. Pt is dependent on wheelchair, due to generalized weakness, but he can ambulate short distances within his room. Writer instructed pt to utilize call bell for assistance, and pt verbalizes understanding. Pt pulls self to sitting position and stands independently. Writer provided food and 15 minute checks initiated for safety. MD contacted for PRN medications as well. Will continue to monitor.

## 2015-07-31 NOTE — ED Notes (Signed)
BEHAVIORAL HEALTH ROUNDING Patient sleeping:  No  Patient alert and oriented: Yes   Behavior appropriate: Yes   Nutrition and fluids offered: Yes  Toileting and hygiene offered: Yes   Sitter present: NA  Law enforcement present: No

## 2015-07-31 NOTE — ED Notes (Signed)
ENVIRONMENTAL ASSESSMENT Potentially harmful objects out of patient reach: Yes.   Personal belongings secured: Yes.   Patient dressed in hospital provided attire only: Yes.   Plastic bags out of patient reach: yes Patient care equipment (cords, cables, call bells, lines, and drains) shortened, removed, or accounted for: Yes.   Equipment and supplies removed from bottom of stretcher: Yes.   Potentially toxic materials out of patient reach: Yes.   Sharps container removed or out of patient reach: No.

## 2015-07-31 NOTE — ED Notes (Signed)
ED BHU PLACEMENT JUSTIFICATION Is the patient under IVC or is there intent for IVC: No. Is the patient medically cleared: No. Is there vacancy in the ED BHU: No. Is the population mix appropriate for patient: No. Is the patient awaiting placement in inpatient or outpatient setting: No. Has the patient had a psychiatric consult: No. Survey of unit performed for contraband, proper placement and condition of furniture, tampering with fixtures in bathroom, shower, and each patient room: Yes.  ; Findings:  APPEARANCE/BEHAVIOR calm and cooperative NEURO ASSESSMENT Orientation: time, place and person Hallucinations: No.None noted (Hallucinations) Speech: Normal Gait: normal RESPIRATORY ASSESSMENT Normal expansion.  Clear to auscultation.  No rales, rhonchi, or wheezing. CARDIOVASCULAR ASSESSMENT regular rate and rhythm, S1, S2 normal, no murmur, click, rub or gallop GASTROINTESTINAL ASSESSMENT soft, nontender, BS WNL, no r/g EXTREMITIES normal strength, tone, and muscle mass PLAN OF CARE Provide calm/safe environment. Vital signs assessed twice daily. ED BHU Assessment once each 12-hour shift. Collaborate with intake RN daily or as condition indicates. Assure the ED provider has rounded once each shift. Provide and encourage hygiene. Provide redirection as needed. Assess for escalating behavior; address immediately and inform ED provider.  Assess family dynamic and appropriateness for visitation as needed: Yes.  ; If necessary, describe findings:  Educate the patient/family about BHU procedures/visitation: Yes.  ; If necessary, describe findings:
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
## 2015-07-31 NOTE — ED Notes (Signed)
AAOx3.  Skin warm and dry.  Patient anxious but remains cooperative.  Anxious about "getting the strong medicine to help sleep tonight".  Emotional reassurance given, minimally effective.  Continue to monitor.

## 2015-07-31 NOTE — ED Notes (Signed)
BEHAVIORAL HEALTH ROUNDING Patient sleeping: no   Patient alert and oriented: yes   Behavior appropriate:  yes  Nutrition and fluids offered: yes Toileting and hygiene offered: yes   Sitter present: no   Law enforcement present: no

## 2015-07-31 NOTE — ED Notes (Addendum)
Pt to ED via EMS, EMS reports they picked pt up off the side of road, pt reported he walked from his house to find help, states he lives alone and has parkinson's and has having increased weakness and worsening parkinson's symptoms, states he also has been feeling depressed, states "I don't feel like I want to live", denies attempting to harm self at this time, states he has not eat or drink in 3 days, tremors present

## 2015-07-31 NOTE — ED Notes (Addendum)
BEHAVIORAL HEALTH ROUNDING Patient sleeping:  No   Patient alert and oriented: yes  Behavior appropriate: yes   Nutrition and fluids offered: Yes  Toileting and hygiene offered: Yes   Sitter present:  NA  Law enforcement present: No

## 2015-08-01 ENCOUNTER — Inpatient Hospital Stay: Payer: Medicare Other

## 2015-08-01 DIAGNOSIS — G250 Essential tremor: Secondary | ICD-10-CM | POA: Diagnosis present

## 2015-08-01 DIAGNOSIS — F3130 Bipolar disorder, current episode depressed, mild or moderate severity, unspecified: Secondary | ICD-10-CM | POA: Diagnosis present

## 2015-08-01 DIAGNOSIS — F315 Bipolar disorder, current episode depressed, severe, with psychotic features: Secondary | ICD-10-CM | POA: Diagnosis not present

## 2015-08-01 LAB — LIPID PANEL
CHOL/HDL RATIO: 4.1 ratio
CHOLESTEROL: 173 mg/dL (ref 0–200)
HDL: 42 mg/dL (ref >40)
LDL Cholesterol: 113 mg/dL — ABNORMAL HIGH (ref 0–99)
TRIGLYCERIDES: 90 mg/dL (ref <150)
VLDL: 18 mg/dL (ref 0–40)

## 2015-08-01 LAB — TSH: TSH: 0.818 u[IU]/mL (ref 0.350–4.500)

## 2015-08-01 LAB — HEMOGLOBIN A1C: HEMOGLOBIN A1C: 5.4 % (ref 4.0–6.0)

## 2015-08-01 MED ORDER — HYDRALAZINE HCL 25 MG PO TABS: 25.0000 mg | ORAL_TABLET | Freq: Three times a day (TID) | ORAL | Status: DC | PRN

## 2015-08-01 MED ORDER — QUETIAPINE FUMARATE 25 MG PO TABS: 25.0000 mg | ORAL_TABLET | Freq: Every day | ORAL | Status: DC

## 2015-08-01 MED ORDER — QUETIAPINE FUMARATE 200 MG PO TABS
200.0000 mg | ORAL_TABLET | Freq: Every day | ORAL | Status: DC
  Administered 2015-08-01: 200 mg via ORAL
  Filled 2015-08-01: qty 1

## 2015-08-01 MED ORDER — PROPRANOLOL HCL ER 60 MG PO CP24
60.0000 mg | ORAL_CAPSULE | Freq: Every day | ORAL | Status: DC
  Administered 2015-08-02 – 2015-08-04 (×3): 60 mg via ORAL
  Filled 2015-08-01 (×4): qty 1

## 2015-08-01 MED ORDER — QUETIAPINE FUMARATE 25 MG PO TABS
25.0000 mg | ORAL_TABLET | Freq: Every day | ORAL | Status: DC
  Administered 2015-08-01 – 2015-08-02 (×2): 25 mg via ORAL
  Filled 2015-08-01 (×2): qty 1

## 2015-08-01 MED ORDER — LIDOCAINE 5 % EX PTCH
1.0000 | MEDICATED_PATCH | CUTANEOUS | Status: DC
  Administered 2015-08-01 – 2015-08-04 (×3): 1 via TRANSDERMAL
  Filled 2015-08-01 (×5): qty 1

## 2015-08-01 MED ORDER — TRAMADOL HCL 50 MG PO TABS
50.0000 mg | ORAL_TABLET | Freq: Two times a day (BID) | ORAL | Status: DC | PRN
  Administered 2015-08-01: 50 mg via ORAL
  Filled 2015-08-01: qty 1

## 2015-08-01 NOTE — Evaluation (Signed)
Physical Therapy Evaluation Patient Details Name: Kyle [name] MRN: [id number] DOB: Jul 05, 1946 Today's Date: 08/01/2015   History of Present Illness  Kyle [name] is a 69 y.o. male presents with stated issues from a social situation of living by himself and being out of his medications for the last 2-3 weeks. He states he's had nothing to eat or drink over the last 3 days. He states mostly symptoms consistent with his history of depression that he "doesn't want to live anymore ". He states he has no transportation at home and he states a history of Parkinson's syndrome though there is some documentation of more of an essential tremor. He states he could not charges filed and then sought help by walking along the road until EMS arrived to pick the patient up. He denies any fever or head trauma. He denies any chest or abdominal pain. He denies any hallucinations or homicidal thoughts. Pt provides minimal history to therapist today. Continually repeats, "I just don't want to live like this," and "I don't know" when questioned about his living situation, baseline mobility, and equipment. Pt endorses history of multiple falls but is unwilling/unable to provide further details.   Clinical Impression  Pt demonstrates very poor motivation to participate with therapist. He appears well nourished with reasonable muscle bulk but offers poor effort with manual muscle testing. Pt constantly repeats the phrase, "I don't want to go on living like this," and "I don't know to most questions." For this reason PT evaluation is limited. Pt is independent with bed mobility and transfers without assistive device. He stands at EOB without loss of balance and is able to close his eyes without increase sway. Pt takes a few small steps forward with min guard and no assistive device but refuses to walk further or participate more with therapist. Recommend rolling walker for ambulation due to recent history of falls. HH PT may  be beneficial but difficult to assess due to poor motivation. Pt unwilling to answer when asked if he would participate with [facility] PT. Will continue to attempt to work with patient to better assess balance and gait as affect improves. Pt will benefit from skilled PT services to address deficits in strength, balance, and mobility in order to return to full function at home.     Follow Up Recommendations Home health PT (Pt unwilling to answer when asked if he would participate)    Equipment Recommendations  Rolling walker with 5" wheels (History of recent falls)    Recommendations for Other Services       Precautions / Restrictions Precautions Precautions: Fall Restrictions Weight Bearing Restrictions: No      Mobility  Bed Mobility Overal bed mobility: Independent                Transfers Overall transfer level: Independent Equipment used: None             General transfer comment: good speed and sequencing noted  Ambulation/Gait Ambulation/Gait assistance: Min guard Ambulation Distance (Feet): 2 Feet Assistive device: None Gait Pattern/deviations: Decreased step length - right;Decreased step length - left     General Gait Details: Pt takes a few small steps forward/backward at edge of bed but then refuses to go further at this time. Poor motivation noted. Pt is somewhat shaky and tremulous during ambulation but refuses to attempt with rolling walker.  Stairs            Wheelchair Mobility    Modified Rankin (Stroke Patients Only)  Balance Overall balance assessment: No apparent balance deficits (not formally assessed) (Difficult to assess due to poor effort)                                           Pertinent Vitals/Pain Pain Assessment:  (Reports "pain all over" but doesn't rate)    Home Living Family/patient expects to be discharged to:: Private residence Living Arrangements: Alone Available Help at Discharge:  Friend(s);Other (Comment) (Reports friend was helping with meals) Type of Home: Mobile home Home Access: Stairs to enter Entrance Stairs-Rails:  (Unclear, sounds like he has at least one rail) Entrance Stairs-Number of Steps: 2 Home Layout: One level Home Equipment:  (Pt unwilling/unable to answer question)      Prior Function Level of Independence: Independent               Hand Dominance        Extremity/Trunk Assessment   Upper Extremity Assessment: Difficult to assess due to impaired cognition (Poor efffort)           Lower Extremity Assessment: Difficult to assess due to impaired cognition (Poor effort)         Communication   Communication: No difficulties  Cognition Arousal/Alertness: Awake/alert Behavior During Therapy: Agitated;Anxious Overall Cognitive Status: Difficult to assess (Flat affect, limited response to questions)                      General Comments      Exercises        Assessment/Plan    PT Assessment Patient needs continued PT services  PT Diagnosis Difficulty walking   PT Problem List Decreased activity tolerance;Decreased balance;Decreased strength  PT Treatment Interventions Gait training;DME instruction;Therapeutic activities;Therapeutic exercise;Balance training;Neuromuscular re-education   PT Goals (Current goals can be found in the Care Plan section) Acute Rehab PT Goals Patient Stated Goal: "I don't want to live life like this." Unable/unwilling to provide further details    Frequency Min 2X/week   Barriers to discharge        Co-evaluation               End of Session Equipment Utilized During Treatment: Gait belt Activity Tolerance: Other (comment) (Poor motivation) Patient left: in bed Nurse Communication: Mobility status         Time: 8119-1478 PT Time Calculation (min) (ACUTE ONLY): 17 min   Charges:   PT Evaluation $Initial PT Evaluation Tier I: 1 Procedure     PT G Codes:        [name] PT, DPT   [name] 08/01/2015, 4:32 PM

## 2015-08-01 NOTE — [facility] Group Notes (Signed)
[facility] Group Notes:  (Nursing/MHT/Case Management/Adjunct)  Date:  08/01/2015  Time:  2:22 PM  Type of Therapy:  Group Therapy  Participation Level:  Did Not Attend  Participation Quality: Summary of Progress/Problems:  Kyle [name] 08/01/2015, 2:22 PM
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
## 2015-08-01 NOTE — Tx Team (Signed)
Interdisciplinary Treatment Plan Update (Adult)  Date: 08/01/2015 Time Reviewed: 4:48 PM  Progress in Treatment: Attending groups: Yes. Participating in groups: No. Taking medication as prescribed: Yes. Tolerating medication: Yes. Family/Significant othe contact made: No, not yet  Patient understands diagnosis: No. and As evidenced by: Limited insight  Discussing patient identified problems/goals with staff: Yes. Medical problems stabilized or resolved: Yes. Denies suicidal/homicidal ideation: Yes. Issues/concerns per patient self-inventory: No. Other:  New problem(s) identified: No, Describe: NA'  Discharge Plan or Barriers: Pt is unsure of d/c plan. CSW assessing.   Reason for Continuation of Hospitalization: Depression  Medication stabilization Suicidal ideation   Comments:Kyle [name] is a 69 y.o. male presents with stated issues from a social situation of living by himself and being out of his medications for the last 2-3 weeks. He states he's had nothing to eat or drink over the last 3 days. He states mostly symptoms consistent with his history of depression that he "" doesn't want to live anymore "". He states he has no transportation at home and he states a history of Parkinson's syndrome though there is some documentation of more of an essential tremor. He states he could not charges filed and then sought help by walking along the road until EMS arrived to pick the patient up.   69 y/o divorced Caucasian male with history of bipolar disorder type I. The patient was brought into our emergency department by EMS. The patient explains that since May he had developed worsening tremors and inability to care for himself. Patient says he wasn't sleeping well, was not able to feed himself because of the severe tremors and his gait has become unsteady. Patient states he was diagnosed by his primary care provider and by his psychiatrist with Parkinson's disease which  makes him feel like dying. Patient feels like a burden to his family, he does not want to ask any of them for help. He continues to have thoughts of wanting to die as his thyroid living this way, feels that no nobody understand what he is going through, but denies having a plan. Patient states he was recently discharged from a psychiatric hospital in [place] where he was hospitalized for bipolar disorder however he felt that this hospitalization was not helpful for his symptomatology. .   Estimated length of stay: 3 days   New goal(s):  Review of initial/current patient goals per problem list:  1. Goal(s): Patient will participate in aftercare plan  Met:  Target date: at discharge  As evidenced by: Patient will participate within aftercare plan AEB aftercare provider and housing plan at discharge being identified.  2. Goal (s): Patient will exhibit decreased depressive symptoms and suicidal ideations.  Met:  Target date: at discharge  As evidenced by: Patient will utilize self rating of depression at 3 or below and demonstrate decreased signs of depression or be deemed stable for discharge by MD.  3. Goal(s): Patient will demonstrate decreased signs and symptoms of anxiety.  Met:  Target date: at discharge  As evidenced by: Patient will utilize self rating of anxiety at 3 or below and demonstrated decreased signs of anxiety, or be deemed stable for discharge by MD  4. Goal(s): Patient will demonstrate decreased signs of withdrawal due to substance abuse  Met:  Target date:at discharge  As evidenced by: Patient will produce a CIWA/COWS score of 0, have stable vitals signs, and no symptoms of withdrawal.  5. Goal (s): Patient will demonstrate decreased symptoms of psychosis.  Met:No  Target date: at discharge  As evidenced by: Patient will not endorse signs of psychosis or be deemed stable for discharge by MD.    Attendees: Patient:  Kyle [name]  9/8/20164:48 PM  Family:  9/8/20164:48 PM  Physician: Dr. Jerilee Hoh  9/8/20164:48 PM  Nursing: Elige Radon, RN  9/8/20164:48 PM  Clinical Social Worker: [place], [place]  9/8/20164:48 PM  Counselor:  9/8/20164:48 PM  Other: Everitt Amber, [place]  9/8/20164:48 PM  Other:  9/8/20164:48 PM  Other:  9/8/20164:48 PM  Other:  9/8/20164:48 PM  Other:  9/8/20164:48 PM  Other:  9/8/20164:48 PM  Other:  9/8/20164:48 PM  Other:  9/8/20164:48 PM  Other:  9/8/20164:48 PM  Other:  9/8/20164:48 PM   Scribe for Treatment Team:  Wray Kearns MSW, [place] , 08/01/2015, 4:48 PM

## 2015-08-01 NOTE — [facility] Group Notes (Signed)
[facility] LCSW Group Therapy  08/01/2015 4:01 PM  Type of Therapy:  Group Therapy  Participation Level:  Did Not Attend  Modes of Intervention:  Discussion, Education, Socialization and Support  Summary of Progress/Problems:Balance in life: Patients will discuss the concept of balance and how it looks and feels to be unbalanced. Pt will identify areas in their life that is unbalanced and ways to become more balanced.    Kyle [name] L Alaze Garverick MSW, LCSWA  08/01/2015, 4:01 PM  

## 2015-08-01 NOTE — Progress Notes (Signed)
Recreation Therapy Notes  At approximately 10:30 am, LRT attempted assessment. Patient refused stating he was not feeling well.  Jacquelynn Cree, LRT/CTRS 08/01/2015 1:21 PM

## 2015-08-01 NOTE — Progress Notes (Signed)
D: Flat Affect. Grooming appropriate. Patient took shower and brushed teeth this morning. Patient stated he slept good last night with the help of sleep medication. He states he has had to make himself eat. He describes his energy level as low and his concentration as poor. He does verbalize SI. He does not verbalize HI. He stated he did not have a plan to end his life. He had generalized pain at a 10 on a scale of 0-10. MD notified about pain. Physical Therapy consulted.  A: Patient contracted for safety. Patient received education on medications received. R: Patient receptive. Patient verbalized understanding.

## 2015-08-01 NOTE — [facility] Suicide Risk Assessment (Signed)
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 Back pain [M54.9] 08/01/2015  . Essential tremor [G25.0] 08/01/2015  . Hypertension [I10] 04/17/2015     Continued Clinical Symptoms:  Alcohol Use Disorder Identification Test Final Score (AUDIT): 0 The "Alcohol Use Disorders Identification Test", Guidelines for Use in Primary Care, Second Edition.  World Science writer [facility]). Score between 0-7:  no or low risk or alcohol related problems. Score between 8-15:  moderate risk of alcohol related problems. Score between 16-19:  high risk of alcohol related problems. Score 20 or above:  warrants further diagnostic evaluation for alcohol dependence and treatment.   CLINICAL FACTORS:   Severe Anxiety and/or Agitation Bipolar Disorder:   Depressive phase Previous Psychiatric Diagnoses and Treatments    Psychiatric Specialty Exam: Physical Exam  ROS   COGNITIVE FEATURES THAT CONTRIBUTE TO RISK:  Closed-mindedness    SUICIDE RISK:   Moderate:  Frequent suicidal ideation with limited intensity, and duration, some specificity in terms of plans, no associated intent, good self-control, limited dysphoria/symptomatology, some risk factors present, and identifiable protective factors,  including available and accessible social support.  PLAN OF CARE: admit to [facility]  Medical Decision Making:  Established Problem, Worsening (2)  I certify that inpatient services furnished can reasonably be expected to improve the patient's condition.   Kyle [name] 08/01/2015, 12:31 PM

## 2015-08-01 NOTE — Progress Notes (Signed)
Patient returned from MRI around 1700. Staff there reported that he refused to have this procedure. Dr. Ardyth Harps notified.

## 2015-08-01 NOTE — Progress Notes (Signed)
Recreation Therapy Notes  Date: 09.08.16 Time: 4:40 pm Location: Craft Room  Group Topic: Leisure Education  Goal Area(s) Addresses:  Patient will identify activities for each letter of the alphabet. Patient will verbalize ability to integrate positive leisure into life post d/c. Patient will verbalize ability to use leisure as a Associate Professor.  Behavioral Response: Did not attend  Intervention: Leisure Alphabet  Activity: Patients were given a Leisure Information systems manager and instructed to write a healthy leisure activity for each letter of the alphabet.   Education: LRT educated patient on things that are needed to participate in leisure  Education Outcome: Patient did not attend group.  Clinical Observations/Feedback: Patient did not attend group.  Jacquelynn Cree, LRT/CTRS 08/01/2015 4:39 PM

## 2015-08-02 MED ORDER — LORAZEPAM 0.5 MG PO TABS
0.5000 mg | ORAL_TABLET | Freq: Two times a day (BID) | ORAL | Status: DC
  Administered 2015-08-02 – 2015-08-04 (×4): 0.5 mg via ORAL
  Filled 2015-08-02 (×4): qty 1

## 2015-08-02 MED ORDER — AMANTADINE HCL 100 MG PO CAPS
100.0000 mg | ORAL_CAPSULE | Freq: Two times a day (BID) | ORAL | Status: DC
  Administered 2015-08-02 – 2015-08-07 (×11): 100 mg via ORAL
  Filled 2015-08-02 (×11): qty 1

## 2015-08-02 MED ORDER — LORAZEPAM 2 MG PO TABS
2.0000 mg | ORAL_TABLET | Freq: Every day | ORAL | Status: DC
  Administered 2015-08-02 – 2015-08-03 (×2): 2 mg via ORAL
  Filled 2015-08-02 (×2): qty 1

## 2015-08-02 MED ORDER — OLANZAPINE 5 MG PO TBDP
10.0000 mg | ORAL_TABLET | Freq: Every day | ORAL | Status: DC
  Administered 2015-08-02 – 2015-08-05 (×4): 10 mg via ORAL
  Filled 2015-08-02 (×5): qty 2

## 2015-08-02 MED ORDER — TRAMADOL HCL 50 MG PO TABS
50.0000 mg | ORAL_TABLET | Freq: Three times a day (TID) | ORAL | Status: DC
  Administered 2015-08-02 – 2015-08-05 (×9): 50 mg via ORAL
  Filled 2015-08-02 (×9): qty 1

## 2015-08-02 MED ORDER — OLANZAPINE 5 MG PO TBDP
20.0000 mg | ORAL_TABLET | Freq: Every day | ORAL | Status: DC
  Administered 2015-08-02 – 2015-08-04 (×3): 20 mg via ORAL
  Filled 2015-08-02: qty 4
  Filled 2015-08-02: qty 2
  Filled 2015-08-02 (×2): qty 4

## 2015-08-02 NOTE — Progress Notes (Signed)
PT Attempt Note  Patient Details Name: Kyle [name] MRN: [id number] DOB: February 21, 1946   Cancelled Treatment:    Reason Eval/Treat Not Completed: Patient declined, no reason specified Chart reviewed. Attempted to see patient but he refuses to work with therapy. Encouragement provided to ambulate or at least perform bed exercises with patient but he continues to refuse. Pt reports that he is too tired and he didn't sleep well last night. Will attempt treatment on later date/time.   Sharalyn Ink Darsh Vandevoort PT, DPT   Kyle [name] 08/02/2015, 11:48 AM

## 2015-08-02 NOTE — Progress Notes (Signed)
Pt complaints of pain "everywhere". Requests morphine for his pain, reports this is the only thing that helps him. Pt isolates to room and stays in bed all day. Pt then request to get strong medications more often then the other. Educated pt on medication. Encourage and support given. Pt verbalized understanding, reinforcement needed. Will continue to assess and monitor for safety.

## 2015-08-02 NOTE — Progress Notes (Signed)
Recreation Therapy Notes  At approximately 11:20 am, LRT attempted assessment. Patient refused stating he was not feeling well.  Jacquelynn Cree, LRT/CTRS 08/02/2015 2:30 PM

## 2015-08-02 NOTE — Progress Notes (Signed)
Recreation Therapy Notes  Date: 09.09.16 Time: 3:00 pm Location: Craft Room  Group Topic: Coping Skills  Goal Area(s) Addresses:  Patient will participate in coping skill. Patient will verbalize benefit of using art as a coping skill.  Behavioral Response: Did not attend  Intervention: Coloring  Activity: Patients were given coloring sheets and instructed to color and think about the emotions they experienced.  Education: LRT educated patients on healthy coping skills.  Education Outcome: Patient did not attend group.  Clinical Observations/Feedback: Patient did not attend group.  Jacquelynn Cree, LRT/CTRS 08/02/2015 4:19 PM

## 2015-08-02 NOTE — Plan of Care (Signed)
Problem: Ineffective individual coping Goal: LTG: Patient will report a decrease in negative feelings Outcome: Not Progressing Pt continues to voice sadness with passisve SI Goal: STG:Pt. will utilize relaxation techniques to reduce stress STG: Patient will utilize relaxation techniques to reduce stress levels  Outcome: Not Progressing Pt continues to stress and not relax although in bed all day
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
## 2015-08-02 NOTE — Progress Notes (Signed)
D: Patient denies SI/HI/AVH.  Patient affect and mood are anxious and depressed.  Patient did NOT attend evening group. Patient isolative throughout the shift.   No distress noted. A: Support and encouragement offered. Scheduled medications given to pt. Q 15 min checks continued for patient safety. R: Patient receptive. Patient remains safe on the unit.

## 2015-08-02 NOTE — [facility] Group Notes (Signed)
[facility] LCSW Group Therapy  08/02/2015 2:45 PM  Type of Therapy:  Group Therapy  Participation Level:  Did Not Attend    Lulu Riding, MSW, LCSWA 08/02/2015, 2:45 PM

## 2015-08-03 DIAGNOSIS — F3130 Bipolar disorder, current episode depressed, mild or moderate severity, unspecified: Secondary | ICD-10-CM
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
## 2015-08-03 NOTE — [facility] Group Notes (Signed)
[facility] LCSW Aftercare Discharge Planning Group Note (Late note from 08/02/2015)    08/03/2015 9:30 AM  Participation Quality:  Did not attend   [name] L Joram Venson MSW, LCSWA

## 2015-08-03 NOTE — [facility] Group Notes (Signed)
[facility] LCSW Group Therapy  08/03/2015 2:05 PM  Type of Therapy:  Group Therapy  Participation Level:  Did Not Attend  Modes of Intervention:  Discussion, Education, Socialization and Support  Summary of Progress/Problems: Pt will identify unhealthy thoughts and how they impact their emotions and behavior. Pt will be encouraged to discuss these thoughts, emotions and behaviors with the group.   [name] L Katalena Malveaux MSW, LCSWA  08/03/2015, 2:05 PM

## 2015-08-03 NOTE — Progress Notes (Signed)
Patient denies SI/HI. Patient attends groups and is pleasant. Patient has some shaking and verbalizes concerns about MRI regarding Parkinson's. Patient talkative and tells jokes about his life. Continue plan and Q15 min checks for safety. Patient contracts for safety.

## 2015-08-03 NOTE — [facility] Group Notes (Signed)
[facility] Group Notes:  (Nursing/MHT/Case Management/Adjunct)  Date:  08/03/2015  Time:  12:20 PM  Type of Therapy:  Psychoeducational Skills  Participation Level:  Did Not Attend   Marquette Old 08/03/2015, 12:20 PM

## 2015-08-03 NOTE — Plan of Care (Signed)
Problem: Alteration in mood Goal: LTG-Patient reports reduction in suicidal thoughts (Patient reports reduction in suicidal thoughts and is able to verbalize a safety plan for whenever patient is feeling suicidal)  Outcome: Progressing Patient denies SI/ HI and states he does not feel depressed today. The patient states that he has been sleeping better.

## 2015-08-03 NOTE — Plan of Care (Signed)
Problem: Ineffective individual coping Goal: LTG: Patient will report a decrease in negative feelings Outcome: Not Progressing Pt notes no improvement. Goal: STG: Pt will be able to identify effective and ineffective STG: Pt will be able to identify effective and ineffective coping patterns  Outcome: Not Progressing Patient unable to process / discuss coping skills.  Goal: STG: Patient will remain free from self harm Outcome: Progressing No self harm.  Goal: STG:Pt. will utilize relaxation techniques to reduce stress STG: Patient will utilize relaxation techniques to reduce stress levels  Outcome: Progressing Patient medication compliant and willing to participate in assessment process.  Goal: STG-Increase in ability to manage activities of daily living Outcome: Progressing Patient able to complete ADL's independently.
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
## 2015-08-04 ENCOUNTER — Inpatient Hospital Stay: Payer: Medicare Other

## 2015-08-04 LAB — COMPREHENSIVE METABOLIC PANEL
ALK PHOS: 70 U/L (ref 38–126)
ALT: 28 U/L (ref 17–63)
AST: 21 U/L (ref 15–41)
Albumin: 4.3 g/dL (ref 3.5–5.0)
Anion gap: 8 (ref 5–15)
BUN: 22 mg/dL — AB (ref 6–20)
CALCIUM: 9.8 mg/dL (ref 8.9–10.3)
CHLORIDE: 105 mmol/L (ref 101–111)
CO2: 25 mmol/L (ref 22–32)
CREATININE: 0.86 mg/dL (ref 0.61–1.24)
Glucose, Bld: 126 mg/dL — ABNORMAL HIGH (ref 65–99)
Potassium: 4.4 mmol/L (ref 3.5–5.1)
SODIUM: 138 mmol/L (ref 135–145)
Total Bilirubin: 0.6 mg/dL (ref 0.3–1.2)
Total Protein: 7.6 g/dL (ref 6.5–8.1)

## 2015-08-04 LAB — CBC WITH DIFFERENTIAL/PLATELET
BASOS ABS: 0.1 10*3/uL (ref 0–0.1)
BASOS PCT: 1 %
BASOS PCT: 1 %
Basophils Absolute: 0.1 10*3/uL (ref 0–0.1)
EOS ABS: 0.7 10*3/uL (ref 0–0.7)
EOS ABS: 0.4 10*3/uL (ref 0–0.7)
EOS PCT: 6 %
Eosinophils Relative: 5 %
HCT: 39 % — ABNORMAL LOW (ref 40.0–52.0)
HCT: 39.2 % — ABNORMAL LOW (ref 40.0–52.0)
HEMOGLOBIN: 13.5 g/dL (ref 13.0–18.0)
HEMOGLOBIN: 13.3 g/dL (ref 13.0–18.0)
LYMPHS ABS: 2.6 10*3/uL (ref 1.0–3.6)
Lymphocytes Relative: 23 %
Lymphocytes Relative: 21 %
Lymphs Abs: 1.9 10*3/uL (ref 1.0–3.6)
MCH: 32.5 pg (ref 26.0–34.0)
MCH: 32.3 pg (ref 26.0–34.0)
MCHC: 34.6 g/dL (ref 32.0–36.0)
MCHC: 34 g/dL (ref 32.0–36.0)
MCV: 93.9 fL (ref 80.0–100.0)
MCV: 95 fL (ref 80.0–100.0)
MONO ABS: 1.1 10*3/uL — AB (ref 0.2–1.0)
MONOS PCT: 9 %
MONOS PCT: 8 %
Monocytes Absolute: 0.7 10*3/uL (ref 0.2–1.0)
NEUTROS PCT: 61 %
NEUTROS PCT: 65 %
Neutro Abs: 7.1 10*3/uL — ABNORMAL HIGH (ref 1.4–6.5)
Neutro Abs: 5.8 10*3/uL (ref 1.4–6.5)
PLATELETS: 274 10*3/uL (ref 150–440)
Platelets: 268 10*3/uL (ref 150–440)
RBC: 4.16 MIL/uL — ABNORMAL LOW (ref 4.40–5.90)
RBC: 4.13 MIL/uL — AB (ref 4.40–5.90)
RDW: 13.5 % (ref 11.5–14.5)
RDW: 13.6 % (ref 11.5–14.5)
WBC: 11.5 10*3/uL — ABNORMAL HIGH (ref 3.8–10.6)
WBC: 8.9 10*3/uL (ref 3.8–10.6)

## 2015-08-04 LAB — TSH: TSH: 1.107 u[IU]/mL (ref 0.350–4.500)

## 2015-08-04 LAB — LITHIUM LEVEL
LITHIUM LVL: 0.49 mmol/L — AB (ref 0.60–1.20)
Lithium Lvl: 0.3 mmol/L — ABNORMAL LOW (ref 0.60–1.20)

## 2015-08-04 NOTE — Progress Notes (Signed)
Patient alert and oriented x 4. Cooperative with meals, meds and plan of care. Showers and dresses this am with stand by assist and supervision. Patient with intentional tremor noted during med pass. Patient hand tremulous while holding cup to drink water, rt Parkinson disease.  No tremor when cup not in hand. Patient takes prn Tylenol  650 mg for discomfort to hand and feet with good result. Reports feeling really "tired" after adls this am. MD into evaluate and assess. Awaiting any new orders. Nurse to hold afternoon dose of Ativan 0.5 mg po. No SI/HI at this time. Safety maintained.

## 2015-08-04 NOTE — [facility] Group Notes (Signed)
[facility] LCSW Group Therapy  08/04/2015 2:44 PM  Type of Therapy:  Group Therapy  Participation Level:  Did Not Attend  Modes of Intervention:  Discussion, Education, Socialization and Support  Summary of Progress/Problems: Feelings around Relapse. Group members discussed the meaning of relapse and shared personal stories of relapse, how it affected them and others, and how they perceived themselves during this time. Group members were encouraged to identify triggers, warning signs and coping skills used when facing the possibility of relapse. Social supports were discussed and explored in detail.   Emanuel Dowson L Siddhanth Denk MSW, LCSWA  08/04/2015, 2:44 PM  

## 2015-08-04 NOTE — Progress Notes (Signed)
Patient remains on 1:1 with staff at side. Ambulates to dayroom for lunch late in afternoon. Dinner tray saved for patient since he woke late and ate meals late. Safety maintained.
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
## 2015-08-04 NOTE — Progress Notes (Signed)
MD orders head CT and patient escorted by 2 staff via wheelchair for scan. Patient alert and appropriate, slow and shaky during transfers. Patient answer questions appropriately then when on ct scan machine looks up to ceiling and states " I see Jeani Hawking and he is dressed in green".

## 2015-08-04 NOTE — Progress Notes (Signed)
D: Pt denies SI/HI/AVH. Pt is pleasant and cooperative. Pt remained in bed for the duration of the evening. Pt stated everything seemed to go down hill after him and his wife of 5 yrs separated in May 2016.  A: Pt was offered support and encouragement. Pt was given scheduled medications.  Q 15 minute checks were done for safety.  R: 1:1 Continues for safety  Pt is taking medication. Pt has no complaints.Pt receptive to treatment and safety maintained on unit.

## 2015-08-04 NOTE — Progress Notes (Signed)
At about 0100 began to have an acute onset of confusion. Would walk out of room and when asked where he was going would state "looking for my room". Did state he was hungry. Given snack tray with sandwich that he ate. Has worsening tremors and is unsteady on his feet. Dr Mayford Knife paged and orders received for PO Haldol, Ativan, Cogentin. Lab orders for stat Lithium, CMP, and CBC with diff. Sitter order received and sitter placed.

## 2015-08-05 ENCOUNTER — Inpatient Hospital Stay: Payer: Medicare Other

## 2015-08-05 MED ORDER — PROPRANOLOL HCL ER 80 MG PO CP24
80.0000 mg | ORAL_CAPSULE | Freq: Every day | ORAL | Status: DC
  Administered 2015-08-05 – 2015-08-19 (×15): 80 mg via ORAL
  Filled 2015-08-05 (×17): qty 1

## 2015-08-05 MED ORDER — GADOBENATE DIMEGLUMINE 529 MG/ML IV SOLN
20.0000 mL | Freq: Once | INTRAVENOUS | Status: AC | PRN
  Administered 2015-08-05: 18 mL via INTRAVENOUS

## 2015-08-05 MED ORDER — LITHIUM CARBONATE ER 300 MG PO TBCR
300.0000 mg | EXTENDED_RELEASE_TABLET | Freq: Every day | ORAL | Status: DC
  Administered 2015-08-05: 300 mg via ORAL
  Filled 2015-08-05: qty 1

## 2015-08-05 MED ORDER — OLANZAPINE 5 MG PO TBDP
20.0000 mg | ORAL_TABLET | Freq: Every day | ORAL | Status: DC
  Administered 2015-08-05: 20 mg via ORAL
  Filled 2015-08-05: qty 4

## 2015-08-05 MED ORDER — ASPIRIN 81 MG PO CHEW
81.0000 mg | CHEWABLE_TABLET | Freq: Every day | ORAL | Status: DC
Start: 2015-08-05 — End: 2015-08-19
  Administered 2015-08-05 – 2015-08-19 (×15): 81 mg via ORAL
  Filled 2015-08-05 (×15): qty 1

## 2015-08-05 MED ORDER — TRAMADOL HCL 50 MG PO TABS: 50.0000 mg | ORAL_TABLET | Freq: Four times a day (QID) | ORAL | Status: DC | PRN

## 2015-08-05 MED ORDER — ASPIRIN 325 MG PO TABS
325.0000 mg | ORAL_TABLET | Freq: Every day | ORAL | Status: DC
Start: 2015-08-05 — End: 2015-08-05

## 2015-08-05 MED ORDER — TUBERCULIN PPD 5 UNIT/0.1ML ID SOLN
5.0000 [IU] | Freq: Once | INTRADERMAL | Status: AC
  Administered 2015-08-05: 5 [IU] via INTRADERMAL
  Filled 2015-08-05: qty 0.1

## 2015-08-05 MED ORDER — OLANZAPINE 5 MG PO TBDP: 30.0000 mg | ORAL_TABLET | Freq: Every day | ORAL | Status: DC
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
## 2015-08-05 NOTE — Progress Notes (Signed)
Recreation Therapy Notes  Date: 09.12.16 Time: 3:00 pm Location: Craft Room  Group Topic: Self-expression  Goal Area(s) Addresses:  Patient will identify one color per emotion listed on wheel. Patient will verbalize benefit of using art as a means of self-expression. Patient will verbalize one emotion experienced during session. Patient will be educated on other forms of self-expression.  Behavioral Response: Did not attend  Intervention: Emotion Wheel  Activity: Patients were given a worksheet with 7 different emotions and were instructed to pick a color for each emotion.  Education: LRT educated patient on different forms of self-expression.   Education Outcome: Patient did not attend group.  Clinical Observations/Feedback: Patient did not attend group.  Jacquelynn Cree, LRT/CTRS 08/05/2015 4:30 PM

## 2015-08-05 NOTE — Plan of Care (Signed)
Problem: Ineffective individual coping Goal: STG: Patient will remain free from self harm Outcome: Progressing Pt safe on the unit at this time with 1:1 sitter  Problem: Alteration in mood Goal: LTG-Patient reports reduction in suicidal thoughts (Patient reports reduction in suicidal thoughts and is able to verbalize a safety plan for whenever patient is feeling suicidal)  Outcome: Progressing Denies SI at this time
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
## 2015-08-05 NOTE — [facility] Group Notes (Signed)
[facility] Group Notes:  (Nursing/MHT/Case Management/Adjunct)  Date:  08/05/2015  Time:  2:10 PM  Type of Therapy:  Psychoeducational Skills  Participation Level:  Did Not Attend  Marquette Old 08/05/2015, 2:10 PM

## 2015-08-05 NOTE — Plan of Care (Signed)
Problem: Ineffective individual coping Goal: STG: Pt will be able to identify effective and ineffective STG: Pt will be able to identify effective and ineffective coping patterns  Outcome: Progressing Patient states he wants help with his walking and is agreeing to participate with physical therapy.

## 2015-08-05 NOTE — Progress Notes (Signed)
D: Patient remains hopeless and helpless. He has a flat, sad affect. Rates pain as greater than 10 but cannot state where the pain is. He said he wants help with his tremors and walking. He also said he is having trouble sleeping because his room is by the patient telephones.. Denies SI/HI/AVH. A: Given meds. Discussed new medication for tremors that was started today. Encouraged patient to cooperate with treatment as he refused PT and MRI last week. Has 1:1 sitter for fall precautions. On q 15 minute checks. Offered emotional support. R: Agreed and had MRI. Taking meds. Becoming more cooperative with treatment but not attending groups.

## 2015-08-05 NOTE — [facility] Group Notes (Signed)
[facility] LCSW Group Therapy  08/05/2015 5:09 PM  Type of Therapy:  Group Therapy  Participation Level:  Did Not Attend   Lulu Riding, MSW, LCSWA 08/05/2015, 5:09 PM

## 2015-08-06 LAB — GLUCOSE, CAPILLARY: GLUCOSE-CAPILLARY: 156 mg/dL — AB (ref 65–99)

## 2015-08-06 MED ORDER — OLANZAPINE 5 MG PO TBDP
30.0000 mg | ORAL_TABLET | Freq: Every day | ORAL | Status: DC
  Administered 2015-08-06 – 2015-08-10 (×5): 30 mg via ORAL
  Filled 2015-08-06 (×5): qty 6

## 2015-08-06 MED ORDER — CLONAZEPAM 1 MG PO TABS
1.0000 mg | ORAL_TABLET | Freq: Every day | ORAL | Status: DC
  Administered 2015-08-06: 1 mg via ORAL
  Filled 2015-08-06: qty 1

## 2015-08-06 NOTE — Progress Notes (Signed)
PT Cancellation Note  Patient Details Name: Kyle [name] MRN: [id number] DOB: 06-Oct-1946   Cancelled Treatment:    Reason Eval/Treat Not Completed:  (See PT note for further details) PT attempted, however pt was busy eating dinner at the time. Will attempt at later time/date.    Benna Dunks 08/06/2015, 5:09 PM  Benna Dunks, SPT. [phone]

## 2015-08-06 NOTE — Progress Notes (Signed)
D: Patient  Remained  On 1:1 this shift . Remained to have coarse hand tremors  Interactive with peers  And staff . Affect cheerful on approach. Patient denies suicidal  homocidal and hallucinations . Attending unit programing, Appropriate ADL's and personal chores . Appetite good and voice no concerns around sleep.  A: Instructed on medication and patient  Verbalized understanding of information received. Encourage patient to come to staff  for any concerns  R: Patient receptive to information, voice no other concerns

## 2015-08-06 NOTE — Plan of Care (Signed)
Problem: Alteration in mood Goal: LTG-Patient reports reduction in suicidal thoughts (Patient reports reduction in suicidal thoughts and is able to verbalize a safety plan for whenever patient is feeling suicidal)  Outcome: Progressing Patient denies suicidal ideations      

## 2015-08-06 NOTE — Progress Notes (Signed)
Recreation Therapy Notes  Date: 09.13.16 Time: 3:00 pm Location: Craft Room  Group Topic: Goal Setting  Goal Area(s) Addresses:  Patient will write at least one goal. Patient will write at least one obstacle.  Behavioral Response: Did not attend  Intervention: Recovery Goal Chart  Activity: Patients were instructed to write goals, obstacles, the date they started working on their goals, and the date they achieved their goals.  Education: LRT educated patients on healthy ways they can celebrate reaching their goals.   Education Outcome: Patient did not attend group.   Clinical Observations/Feedback: Patient did not attend group.  Jacquelynn Cree, LRT/CTRS 08/06/2015 4:25 PM

## 2015-08-06 NOTE — Progress Notes (Addendum)
D: Patient denies SI/HI/AVH.   Patient affect is sad and his mood is depressed and anxious.  Patient did NOT attend evening group. Patient visible on the milieu. No distress noted. A: Support and encouragement offered. Scheduled medications given to pt. 1:1 safety observation continued for patient safety. R: Patient receptive. Patient remains safe on the unit.

## 2015-08-06 NOTE — [facility] Group Notes (Signed)
[facility] LCSW Group Therapy  08/06/2015 2:10 PM  Type of Therapy:  Group Therapy  Participation Level:  Did Not Attend   Lulu Riding, MSW, LCSWA 08/06/2015, 2:10 PM

## 2015-08-06 NOTE — [facility] Group Notes (Signed)
[facility] Group Notes:  (Nursing/MHT/Case Management/Adjunct)  Date:  08/06/2015  Time:  2:08 PM  Type of Therapy:  Psychoeducational Skills  Participation Level:  Active  Participation Quality:  Appropriate  Affect:  Appropriate  Cognitive:  Appropriate  Insight:  Good  Engagement in Group:  Engaged  Modes of Intervention:  Discussion and Education  Summary of Progress/Problems:  Mickey Farber 08/06/2015, 2:08 PM
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
## 2015-08-07 ENCOUNTER — Encounter: Payer: Self-pay | Admitting: Psychiatry

## 2015-08-07 DIAGNOSIS — I679 Cerebrovascular disease, unspecified: Secondary | ICD-10-CM | POA: Diagnosis present

## 2015-08-07 LAB — FERRITIN: Ferritin: 122 ng/mL (ref 24–336)

## 2015-08-07 LAB — TSH: TSH: 0.893 u[IU]/mL (ref 0.350–4.500)

## 2015-08-07 MED ORDER — ASPIRIN 81 MG PO CHEW: 81.0000 mg | CHEWABLE_TABLET | Freq: Every day | ORAL | Status: DC

## 2015-08-07 MED ORDER — SENNA 8.6 MG PO TABS: 2.0000 | ORAL_TABLET | Freq: Every day | ORAL | Status: DC

## 2015-08-07 MED ORDER — OLANZAPINE 15 MG PO TABS: 30.0000 mg | ORAL_TABLET | Freq: Every day | ORAL | Status: DC

## 2015-08-07 MED ORDER — LORAZEPAM 2 MG PO TABS: 2.0000 mg | ORAL_TABLET | Freq: Three times a day (TID) | ORAL | Status: DC | PRN

## 2015-08-07 MED ORDER — LITHIUM CARBONATE ER 300 MG PO TBCR
600.0000 mg | EXTENDED_RELEASE_TABLET | Freq: Every day | ORAL | Status: DC
Start: 2015-08-07 — End: 2015-09-20

## 2015-08-07 MED ORDER — LORAZEPAM 2 MG PO TABS: 2.0000 mg | ORAL_TABLET | Freq: Every evening | ORAL | Status: DC | PRN

## 2015-08-07 MED ORDER — POLYETHYLENE GLYCOL 3350 17 G PO PACK
17.0000 g | PACK | Freq: Every day | ORAL | Status: DC
  Administered 2015-08-07 – 2015-08-11 (×5): 17 g via ORAL
  Filled 2015-08-07 (×7): qty 1

## 2015-08-07 MED ORDER — LORAZEPAM 2 MG PO TABS
2.0000 mg | ORAL_TABLET | Freq: Every evening | ORAL | Status: DC | PRN
Start: 2015-08-07 — End: 2015-08-08

## 2015-08-07 MED ORDER — PRIMIDONE 50 MG PO TABS
25.0000 mg | ORAL_TABLET | Freq: Every day | ORAL | Status: DC
Start: 2015-08-07 — End: 2015-08-13
  Administered 2015-08-07 – 2015-08-12 (×6): 25 mg via ORAL
  Filled 2015-08-07 (×2): qty 0.5
  Filled 2015-08-07: qty 1
  Filled 2015-08-07: qty 0.5
  Filled 2015-08-07 (×2): qty 1
  Filled 2015-08-07: qty 2
  Filled 2015-08-07: qty 1

## 2015-08-07 MED ORDER — CLONAZEPAM 1 MG PO TABS: 2.0000 mg | ORAL_TABLET | Freq: Every day | ORAL | Status: DC

## 2015-08-07 MED ORDER — SIMVASTATIN 10 MG PO TABS: 10.0000 mg | ORAL_TABLET | Freq: Every day | ORAL | Status: DC

## 2015-08-07 MED ORDER — SIMVASTATIN 20 MG PO TABS
10.0000 mg | ORAL_TABLET | Freq: Every day | ORAL | Status: DC
  Administered 2015-08-07: 10 mg via ORAL
  Filled 2015-08-07: qty 1

## 2015-08-07 MED ORDER — AMLODIPINE BESYLATE 10 MG PO TABS: 10.0000 mg | ORAL_TABLET | Freq: Every day | ORAL | Status: DC

## 2015-08-07 MED ORDER — PROPRANOLOL HCL ER 80 MG PO CP24: 80.0000 mg | ORAL_CAPSULE | Freq: Every day | ORAL | Status: DC

## 2015-08-07 MED ORDER — PRIMIDONE 50 MG PO TABS: 25.0000 mg | ORAL_TABLET | Freq: Every day | ORAL | Status: DC

## 2015-08-07 MED ORDER — GABAPENTIN 300 MG PO CAPS
300.0000 mg | ORAL_CAPSULE | Freq: Two times a day (BID) | ORAL | Status: DC
  Administered 2015-08-07 – 2015-08-08 (×3): 300 mg via ORAL
  Filled 2015-08-07 (×3): qty 1

## 2015-08-07 MED ORDER — SENNA 8.6 MG PO TABS
2.0000 | ORAL_TABLET | Freq: Every day | ORAL | Status: DC
  Administered 2015-08-07 – 2015-08-11 (×5): 17.2 mg via ORAL
  Filled 2015-08-07 (×5): qty 2

## 2015-08-07 MED ORDER — GABAPENTIN 300 MG PO CAPS: 300.0000 mg | ORAL_CAPSULE | Freq: Two times a day (BID) | ORAL | Status: DC
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
## 2015-08-07 NOTE — [facility] Group Notes (Signed)
[facility] Group Notes:  (Nursing/MHT/Case Management/Adjunct)  Date:  08/07/2015  Time:  12:24 PM  Type of Therapy:  Group Therapy  Participation Level:  Active  Participation Quality:  Appropriate  Affect:  Appropriate  Cognitive:  Appropriate  Insight:  Good  Engagement in Group:  Engaged  Modes of Intervention:  Support  Summary of Progress/Problems:  Kyle [name] 08/07/2015, 12:24 PM
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
## 2015-08-07 NOTE — [facility] Group Notes (Signed)
[facility] LCSW Group Therapy  08/07/2015 4:59 PM  Type of Therapy:  Group Therapy  Participation Level:  None   Summary of Progress/Problems: Patient attended group but was called out by staff and did not return.  Lulu Riding, MSW, LCSWA 08/07/2015, 4:59 PM

## 2015-08-07 NOTE — Plan of Care (Signed)
Problem: Diagnosis: Increased Risk For Suicide Attempt Goal: LTG-Patient Will Report Improved Mood and Deny Suicidal LTG (by discharge) Patient will report improved mood and deny suicidal ideation.  Outcome: Progressing Pt states that his mood has improved since he has been here. Denies SI

## 2015-08-07 NOTE — Progress Notes (Signed)
Patient lying in bed with eyes open most of the night. The patient states that he did not sleep. Sitter in place. Q 15 checks maintained for safety during shift. Patient denies SI and states that when he gets his strength up he thinks he will be all right. Will continue to monitor.

## 2015-08-07 NOTE — Plan of Care (Signed)
Problem: Diagnosis: Increased Risk For Suicide Attempt Goal: STG-Patient Will Attend All Groups On The Unit Outcome: Progressing Pt verbalizes an increased desire to attend groups.

## 2015-08-07 NOTE — [facility] Group Notes (Signed)
[facility] LCSW Aftercare Discharge Planning Group Note   08/07/2015 11:31 AM  Participation Quality:  Good  Mood/Affect:  Appropriate  Depression Rating:    Anxiety Rating:    Thoughts of Suicide:  NA Will you contract for safety?   Yes  Current AVH:  No  Plan for Discharge/Comments:  Discharge home with home health referral to ACT or [facility]  Transportation Means: sister  Supports: Sister, home health  Kyle [name], Cleda Daub, MSW, LCSW

## 2015-08-07 NOTE — Progress Notes (Signed)
D: Pt is alert and oriented. Pt complains of lack of sleep the previous night. Pt states that "the phone outside kept ringing and woke me up". Pt notes that his energy has improved since he has been here and reports a desire to participate in group and walk around the unit some more in order to get exercise.  Pt rates depression as a 5 out of 10, hopelessness 0 out of 10, and anxiety 6 out of 10. Denis SI/HI/AVH. No complaints of pain. A tremor is noted. Pt uses a front wheel walker to ambulate. A: Pt is encouraged to go outside this afternoon and continue to walk around the unit.  R: Pt denies other physical problems.  Safety is maintained with q15 minute safety checks and with a 1:1 sitter.

## 2015-08-07 NOTE — Progress Notes (Signed)
Physical Therapy Treatment Patient Details Name: Kyle [name] MRN: [id number] DOB: 11/28/1945 Today's Date: 08/07/2015    History of Present Illness Kyle [name] is a 69 y.o. male presents with stated issues from a social situation of living by himself and being out of his medications for the last 2-3 weeks. He states he's had nothing to eat or drink over the last 3 days. He states mostly symptoms consistent with his history of depression that he "doesn't want to live anymore ". He states he has no transportation at home and he states a history of Parkinson's syndrome though there is some documentation of more of an essential tremor. He states he could not charges filed and then sought help by walking along the road until EMS arrived to pick the patient up. He denies any fever or head trauma. He denies any chest or abdominal pain. He denies any hallucinations or homicidal thoughts. Pt provides minimal history to therapist today. Continually repeats, "I just don't want to live like this," and "I don't know" when questioned about his living situation, baseline mobility, and equipment. Pt endorses history of multiple falls but is unwilling/unable to provide further details.     PT Comments    Pt appearing fairly steady ambulating from bathroom to recliner in room without AD (tremors noted) but pt requesting to use RW to ambulate in hallway d/t reporting feeling more steady with RW.  Pt appearing with improved mood today and was talkative and joking around during session.  Pt appears steady ambulating with RW.   Follow Up Recommendations  Home health PT     Equipment Recommendations  Rolling walker with 5" wheels    Recommendations for Other Services       Precautions / Restrictions Precautions Precautions: Fall Restrictions Weight Bearing Restrictions: No    Mobility  Bed Mobility               General bed mobility comments: Not assessed during session (previously  independent)  Transfers Overall transfer level: Needs assistance Equipment used: Rolling walker (2 wheeled) Transfers: Sit to/from Stand Sit to Stand: Supervision         General transfer comment: no loss of balance noted but vc's for safety required with RW  Ambulation/Gait Ambulation/Gait assistance: Supervision;Min guard Ambulation Distance (Feet): 400 Feet Assistive device: Rolling walker (2 wheeled)       General Gait Details: mild tremoring noted in general but pt steady without loss of balance; mild decreased B step length/foot clearance/heelstrike.  Pt required vc's to stay inside RW with turns.   Stairs            Wheelchair Mobility    Modified Rankin (Stroke Patients Only)       Balance Overall balance assessment: Needs assistance Sitting-balance support: No upper extremity supported;Feet supported Sitting balance-Leahy Scale: Good     Standing balance support: No upper extremity supported Standing balance-Leahy Scale: Fair Standing balance comment: WBOS eyes open and closed SBA; NBOS eyes open and closed CGA to min assist to steady                    Cognition Arousal/Alertness: Awake/alert Behavior During Therapy:  (very talkative and joking around during session) Overall Cognitive Status: No family/caregiver present to determine baseline cognitive functioning                      Exercises      General Comments   Nursing cleared pt  for participation in physical therapy.  Pt agreeable to PT session.      Pertinent Vitals/Pain Pain Assessment: No/denies pain  Vitals stable and WFL throughout treatment session (see flowsheet for details).    Home Living                      Prior Function            PT Goals (current goals can now be found in the care plan section) Acute Rehab PT Goals Patient Stated Goal: to walk more Progress towards PT goals: Progressing toward goals    Frequency  Min 2X/week    PT  Plan Current plan remains appropriate    Co-evaluation             End of Session Equipment Utilized During Treatment: Gait belt Activity Tolerance: Patient tolerated treatment well Patient left: in chair;with nursing/sitter in room     Time: 1410-1433 PT Time Calculation (min) (ACUTE ONLY): 23 min  Charges:  $Gait Training: 8-22 mins $Therapeutic Activity: 8-22 mins                    G CodesHendricks Limes August 09, 2015, 2:57 PM Hendricks Limes, PT [phone]

## 2015-08-07 NOTE — Progress Notes (Signed)
Recreation Therapy Notes  Date: 09.14.16 Time: 3:15 pm Location: Craft Room  Group Topic: Self-esteem, coping skills  Goal Area(s) Addresses:  Patient will identify at least one positive trait about self. Patient will identify at least one healthy coping skill.  Behavioral Response: Did not attend  Intervention: All About Me  Activity: Patients were instructed to make an All About Me pamphlet listing their life's motto, positive traits, healthy coping skills, and their healthy support system.  Education: LRT educated patients on ways they can increase their self-esteem.  Education Outcome: Patient did not attend group.  Clinical Observations/Feedback: Patient did not attend group.  Jacquelynn Cree, LRT/CTRS 08/07/2015 4:39 PM

## 2015-08-07 NOTE — Plan of Care (Signed)
Problem: Ineffective individual coping Goal: STG:Pt. will utilize relaxation techniques to reduce stress STG: Patient will utilize relaxation techniques to reduce stress levels  Outcome: Progressing Patient requests bed adjustment so that he can rest comfortable and relax. H.O.B raised.

## 2015-08-07 NOTE — Progress Notes (Signed)
BRIEF NUTRITION NOTE:   SHAHZAIB AZEVEDO is a 69 y.o. male with Bipolar I disorder depressed with melancholic features. Pt assess due to LOS  PMH:  Past Medical History  Diagnosis Date  . 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 Bipolar 1 disorder   . Parkinson disease   . Parkinson's disease may 2016    Diet Order: Heart Healthy  Current Nutrition: recorded po intake 80-100% of meals  Anthropometrics:  Body mass index is 28.05 kg/(m^2).  Labs and Meds: reviewed  Pt is at no nutrition risk due to diagnosis/current problem, BMI of 28, Heart Healthy diet order, 80-100% po intake. No nutrition intervention warranted at this time. Will sign off. Please re-consult RD if nutritional issues arise.   Romelle Starcher MS, RD, LDN [phone] Pager

## 2015-08-08 ENCOUNTER — Encounter: Payer: Self-pay | Admitting: Psychiatry

## 2015-08-08 DIAGNOSIS — G629 Polyneuropathy, unspecified: Secondary | ICD-10-CM

## 2015-08-08 DIAGNOSIS — G2581 Restless legs syndrome: Secondary | ICD-10-CM | POA: Diagnosis present

## 2015-08-08 LAB — LITHIUM LEVEL: Lithium Lvl: 0.4 mmol/L — ABNORMAL LOW (ref 0.60–1.20)

## 2015-08-08 MED ORDER — GABAPENTIN 300 MG PO CAPS
600.0000 mg | ORAL_CAPSULE | Freq: Every day | ORAL | Status: DC
  Administered 2015-08-09 – 2015-08-10 (×2): 600 mg via ORAL
  Filled 2015-08-08 (×2): qty 2

## 2015-08-08 MED ORDER — ROPINIROLE HCL 0.25 MG PO TABS
0.2500 mg | ORAL_TABLET | Freq: Every day | ORAL | Status: DC
  Administered 2015-08-08 – 2015-08-18 (×11): 0.25 mg via ORAL
  Filled 2015-08-08 (×12): qty 1

## 2015-08-08 MED ORDER — GABAPENTIN 300 MG PO CAPS: 600.0000 mg | ORAL_CAPSULE | Freq: Every day | ORAL | Status: DC

## 2015-08-08 MED ORDER — SIMVASTATIN 20 MG PO TABS
10.0000 mg | ORAL_TABLET | Freq: Every day | ORAL | Status: DC
  Administered 2015-08-08 – 2015-08-18 (×11): 10 mg via ORAL
  Filled 2015-08-08 (×12): qty 1

## 2015-08-08 MED ORDER — LORAZEPAM 1 MG PO TABS
1.0000 mg | ORAL_TABLET | Freq: Every day | ORAL | Status: DC
  Administered 2015-08-08 – 2015-08-10 (×3): 1 mg via ORAL
  Filled 2015-08-08 (×3): qty 1

## 2015-08-08 MED ORDER — LORAZEPAM 1 MG PO TABS: 1.0000 mg | ORAL_TABLET | Freq: Every evening | ORAL | Status: DC | PRN

## 2015-08-08 MED ORDER — ROPINIROLE HCL 0.25 MG PO TABS: 0.2500 mg | ORAL_TABLET | Freq: Every day | ORAL | Status: DC

## 2015-08-08 NOTE — [facility] Group Notes (Signed)
[facility] LCSW Group Therapy  08/08/2015 12:29 PM  Type of Therapy:  Group Therapy  Participation Level:  Did Not Attend  Participation Quality:    Affect:    Cognitive:    Insight:    Engagement in Therapy:    Modes of Intervention:    Summary of Progress/Problems:  Cheron Schaumann 08/08/2015, 12:29 PM

## 2015-08-08 NOTE — Progress Notes (Signed)
LCSW received call from sister he did not keep his appointment to [facility]- Never been seen there  He was previously connected to [facility] in [place]- [phone]   [facility] and he was last seen on April /16 by Primary Doctor: Dr Lonna Duval, new appointment made for Sept 29th at 1:20pm

## 2015-08-08 NOTE — Progress Notes (Signed)
Recreation Therapy Notes  Date: 09.15.16 Time: 3:00 pm Location: Craft Room  Group Topic: Leisure Education, Coping skills  Goal Area(s) Addresses:  Patient will identify things they are grateful. Patient will identify how being grateful can influence decision making.  Behavioral Response: Did not attend  Intervention: Grateful Wheel  Activity: Patients were given a I Am Grateful For worksheet and instructed to write at least one thing they are grateful for under each category.  Education: LRT educated patients on ways they can integrate leisure into their schedules.  Education Outcome: Patient did not attend group.  Clinical Observations/Feedback: Patient did not attend group.  Jacquelynn Cree, LRT/CTRS 08/08/2015 4:17 PM
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
## 2015-08-08 NOTE — Plan of Care (Signed)
Problem: Alteration in mood; excessive anxiety as evidenced by: Goal: STG-Patient can identify triggers for anxiety Outcome: Progressing Stated that he is getting better with his anxiety.

## 2015-08-08 NOTE — Progress Notes (Signed)
Patient went to sleep at 3 am and is currently still resting. The patient denies SI. Q15 minute checks and sitter in place for safety and fall prevention. The patient is pleasant during shift but has bouts of restlessness. Will continue to monitor.

## 2015-08-08 NOTE — Progress Notes (Signed)
Per sitter, patient was talking to self, fidgety, and resting in the nude. Will continue to monitor.

## 2015-08-08 NOTE — Progress Notes (Signed)
LCSW met with patient who has been sleeping for the most part of today. He was unable to tell me who his Primary care physician is. Ms Gwyndolyn Saxon ( sister) [phone] was called she reported he was to attend the [facility] in [place] [phone] and gave him the information

## 2015-08-08 NOTE — Progress Notes (Signed)
Neurology Note  S: Pt states tremor is better, he did not sleep good last night and feels sleepy today  ROS neg x 8 except per HPI  O: 98.9 121/66 57 18 Overweight, NAD Normocephalic, oropharynx clear Supple, no JVD CTA B, no wheezing RRR, no murmur No C/C/E  Alert and oriented x 3, nl speech and language PERRLA, EOMI, face symmetric 5/5 B, minimal tremor noted  A/P: 1. Tremor- improved today 2. Restless leg syndrome- unchanged;  This is probably complicating sleep 3. Probable peripheral neuropathy- moderate, no clear etiology 4. Mild white matter changes- Asymptomatic -  Add Requip 0.25mg  qHS for RLS -  Would also add melatonin  qhs for insomnia - Continue ASA  daily and Zocor - Continue Primidone  qHS for tremor and increase to  qHS after one week - Ok to change Neurotin to  qHS - Will need peripheral neuropathy w/u as outpatient - Will sign off, please call with questions - Needs to f/u with [facility] in 3-4 weeks

## 2015-08-08 NOTE — Tx Team (Signed)
Interdisciplinary Treatment Plan Update (Adult)  Date:  08/08/2015 Time Reviewed:  1:32 PM  Progress in Treatment: Attending groups: No. Participating in groups:  no Taking medication as prescribed:  Yes. Tolerating medication:  Yes. Family/Significant othe contact made:  Yes, individual(s) contacted:  Sister [name] Patient understands diagnosis:  Yes. Discussing patient identified problems/goals with staff:  No. Medical problems stabilized or resolved:  Yes. Denies suicidal/homicidal ideation: Yes. Issues/concerns per patient self-inventory:  No. Other:  New problem(s) identified: None  Discharge Plan or Barriers: [facility] and Home Health care for PT  Reason for Continuation of Hospitalization: Medication stabilization  Comments: Patient will have [facility] for PT and he will follow up with either [facility] or [facility].  Estimated length of stay: 2 days  New goal(s):  Review of initial/current patient goals per problem list:   Refer to plan of care  Attendees: Patient:  Kyle [name] 9/15/20161:32 PM  Family:   9/15/20161:32 PM  Physician:  Dr Ardyth Harps 9/15/20161:32 PM  Nursing:    9/15/20161:32 PM  Case Manager:  Arrie Senate LCSW 9/15/20161:32 PM  Counselor:  Susanne Greenhouse 9/15/20161:32 PM  Other:  Phylis Cobb RN 9/15/20161:32 PM  Other:   9/15/20161:32 PM  Other:   9/15/20161:32 PM  Other:  9/15/20161:32 PM  Other:  9/15/20161:32 PM  Other:  9/15/20161:32 PM  Other:  9/15/20161:32 PM  Other:  9/15/20161:32 PM  Other:  9/15/20161:32 PM  Other:   9/15/20161:32 PM   Scribe for Treatment Team:   Cheron Schaumann, 08/08/2015, 1:32 PM

## 2015-08-08 NOTE — Plan of Care (Signed)
Problem: Alteration in mood Goal: LTG-Patient reports reduction in suicidal thoughts (Patient reports reduction in suicidal thoughts and is able to verbalize a safety plan for whenever patient is feeling suicidal)  Outcome: Progressing Patient denies SI.      

## 2015-08-08 NOTE — Progress Notes (Signed)
Patient took a nap in the morning since he stated that he did not sleep good last night.Denies suicidal & homicidal ideation & A/V hallucination.Ambulated in the hallway.Attended groups.Compliant with medications.Still with sitter for safety.

## 2015-08-09 NOTE — Progress Notes (Signed)
Patient continues to have a 1:1 sitter for safety. Medication compliant, isolates to room, primarily sleeping. Patient woke to be surprised at how much time had passed with his sleeping. He continues to be difficult to understand, speaking quietly and somewhat garbled. He continues to have a tremor. He notes no needs. He denies SI, HI, and AVH.

## 2015-08-09 NOTE — [facility] Group Notes (Signed)
[facility] Group Notes:  (Nursing/MHT/Case Management/Adjunct)  Date:  08/09/2015  Time:  11:51 AM  Type of Therapy:  Psychoeducational Skills  Participation Level:  Did Not Attend  P  Kyle [name] 08/09/2015, 11:51 AM

## 2015-08-09 NOTE — Progress Notes (Signed)
Patient continues to have a 1:1 sitter for safety. Medication compliant, isolates to room, primarily sleeping although he does go to groups.   He continues to be difficult to understand, speaking quietly and somewhat garbled. He continues to have a tremor. He notes no needs. He denies SI, HI, and AVH.
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
## 2015-08-09 NOTE — Plan of Care (Signed)
Problem: Ineffective individual coping Goal: LTG: Patient will report a decrease in negative feelings Outcome: Progressing Pt reports feeling better than he did earlier in the week.  Goal: STG: Pt will be able to identify effective and ineffective STG: Pt will be able to identify effective and ineffective coping patterns  Outcome: Progressing Pt reports understanding of med compliance and attendance of groups.  Goal: STG: Patient will remain free from self harm Outcome: Progressing No self harm.   Problem: Alteration in mood Goal: LTG-Patient reports reduction in suicidal thoughts (Patient reports reduction in suicidal thoughts and is able to verbalize a safety plan for whenever patient is feeling suicidal)  Outcome: Progressing Pt reports no suicidal thoughts.  Goal: LTG-Pt's behavior demonstrates decreased signs of depression (Patient's behavior demonstrates decreased signs of depression to the point the patient is safe to return home and continue treatment in an outpatient setting)  Outcome: Progressing Pt awake and able to talk more clearly, interactive, and oriented.

## 2015-08-09 NOTE — [facility] Group Notes (Signed)
[facility] LCSW Aftercare Discharge Planning Group Note   08/09/2015 1:09 PM  Participation Quality: Did not attend.   Candace L Hyatt, MSW, LCSWA  

## 2015-08-09 NOTE — Plan of Care (Signed)
Problem: Ineffective individual coping Goal: LTG: Patient will report a decrease in negative feelings Outcome: Not Progressing Patient continues to endorse depression and confusion. Goal: STG: Patient will remain free from self harm Outcome: Progressing No self harm. Pt has a 1:1 sitter.

## 2015-08-09 NOTE — Progress Notes (Signed)
Recreation Therapy Notes  Date: 09.16.16 Time: 3:00 pm Location: Craft Room  Group Topic: Coping Skills  Goal Area(s) Addresses:  Patient will participate in healthy coping skill. Patient will verbalize at least one emotion experienced during group.  Behavioral Response: Did not attend  Intervention: Coloring  Activity: Patients were instructed to color either mandalas or other coloring sheets and focus on the emotions they were experiencing.  Education: LRT educated patients on different coping skills.  Education Outcome: Patient did not attend group.  Clinical Observations/Feedback: Patient did not attend group.  Greene,Elizabeth M, LRT/CTRS 08/09/2015 4:44 PM 

## 2015-08-10 NOTE — Progress Notes (Signed)
D: patient denies any SI/HI this shift. Denies distress and AVH.  Patient stated he is still in pain all over.  Patients still on 1:1 for safety.  Patient oriented x 4.  Patients affect is flat and depressed.  Patient is compliant with medications.  Patient went to one group this shift but remained in the bed for most of the shift. A: gave patients medications as prescribed, encouragement and support provided.  Encouraged patient to attend groups when possible.  R: patient receptive of information, speech still remains slurred and activity/energy level still low.

## 2015-08-10 NOTE — [facility] Group Notes (Signed)
[facility] Group Notes:  (Nursing/MHT/Case Management/Adjunct)  Date:  08/10/2015  Time:  9:46 PM  Type of Therapy:  Evening Wrap-up Group  Participation Level:  Did Not Attend  Participation Quality:  N/A  Affect:  N/A  Cognitive:  N/A  Insight:  None  Engagement in Group:  None  Modes of Intervention:  Activity  Summary of Progress/Problems:  Tomasita Morrow 08/10/2015, 9:46 PM

## 2015-08-10 NOTE — Plan of Care (Signed)
Problem: Ineffective individual coping Goal: LTG: Patient will report a decrease in negative feelings Outcome: Progressing Pt reports feeling better than in prior days, but now feeling tired and somewhat bored.  Goal: STG: Pt will be able to identify effective and ineffective STG: Pt will be able to identify effective and ineffective coping patterns  Outcome: Progressing Reports understanding of the need for physical activity as tolerated as well as interaction with peers and participation in the milieu.  Goal: STG: Patient will remain free from self harm Outcome: Progressing No self harm.

## 2015-08-10 NOTE — Progress Notes (Signed)
Patient is much improved, oriented x 4 and able to speak clearly. He is pleasant and med compliant, and his only complaint is that he feels tired. He continues to have a 1:1 sitter for safety. He notes no needs or distress and denies SI, HI, and AVH.
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
## 2015-08-10 NOTE — [facility] Group Notes (Signed)
[facility] LCSW Group Therapy  08/10/2015 3:40 PM  Type of Therapy:  Group Therapy  Participation Level:  Minimal  Participation Quality:  Attentive  Affect:  Depressed  Cognitive:  Alert  Insight:  Improving  Engagement in Therapy:  Improving  Modes of Intervention:  Discussion, Education, Socialization and Support  Summary of Progress/Problems: Balance in life: Patients will discuss the concept of balance and how it looks and feels to be unbalanced. Pt will identify areas in their life that is unbalanced and ways to become more balanced.  Pt attended group and stayed the entire time. Pt sat quietly and listened to other group members.   Candace L Hyatt MSW, LCSWA  08/10/2015, 3:40 PM 

## 2015-08-10 NOTE — Plan of Care (Signed)
Problem: Spiritual Needs Goal: Ability to function at adequate level Outcome: Progressing Patient is moving around in the milieu more with less assistance, however still laying in the bed throughout the day.

## 2015-08-11 MED ORDER — LORAZEPAM 0.5 MG PO TABS
0.5000 mg | ORAL_TABLET | Freq: Every day | ORAL | Status: DC
  Administered 2015-08-11: 0.5 mg via ORAL
  Filled 2015-08-11: qty 1

## 2015-08-11 MED ORDER — OLANZAPINE 5 MG PO TBDP
15.0000 mg | ORAL_TABLET | Freq: Every day | ORAL | Status: DC
  Administered 2015-08-11 – 2015-08-12 (×2): 15 mg via ORAL
  Filled 2015-08-11 (×2): qty 3

## 2015-08-11 MED ORDER — GABAPENTIN 300 MG PO CAPS
300.0000 mg | ORAL_CAPSULE | Freq: Every day | ORAL | Status: DC
  Administered 2015-08-11 – 2015-08-18 (×8): 300 mg via ORAL
  Filled 2015-08-11 (×8): qty 1

## 2015-08-11 NOTE — Plan of Care (Signed)
Problem: Spiritual Needs Goal: Ability to function at adequate level Outcome: Progressing Patient is moving outside of room and bed more.  Patient still experiencing tremors and still using a walker to ambulate.

## 2015-08-11 NOTE — Progress Notes (Signed)
D: patient went to group this afternoon and went to day room for meals.  Patient express less depression and anxiety.  Patients energy is low, patient sitting in the chair and looking out the window for the last part of the day.  Patient compliant with medications. Patient still on 1:1 A: encouragement and support given.  Encouraged patient to attend groups and move about the unit more.  R: patient receptive of information

## 2015-08-11 NOTE — [facility] Group Notes (Signed)
[facility] LCSW Group Therapy  08/11/2015 2:36 PM  Type of Therapy:  Group Therapy  Participation Level:  Did Not Attend  Modes of Intervention:  Discussion, Education, Socialization and Support  Summary of Progress/Problems:Pt will learn how to identify obstacles, self-sabotaging and enabling behaviors, what they are and why we do them. Pt will discuss unhealthy relationships and how to have positive healthy boundaries with those that sabotage and enable. Pt will identify aspects of self sabotage and enabling in themselves and how to limit these behaviors.    Candace L Hyatt MSW, LCSWA  08/11/2015, 2:36 PM 
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
## 2015-08-11 NOTE — Progress Notes (Signed)
Patient notes he is ready to get out of the room a little and will attempt to spend some time in the day room and attend groups. He denies SI, HI, and AVH. He reports the need to make changes in his life and being hopeful he will be able to make the necessary changes although he does acknowledge he does not know what all he needs to do to be more satisfied with his life. He continues to have a 1:1 sitter for safety. He was given Tylenol for back pain during the night and was then able to sleep. No other needs or concerns noted.

## 2015-08-12 MED ORDER — ZOLPIDEM TARTRATE 5 MG PO TABS: 5.0000 mg | ORAL_TABLET | Freq: Every day | ORAL | Status: DC

## 2015-08-12 MED ORDER — LORAZEPAM 0.5 MG PO TABS
0.5000 mg | ORAL_TABLET | Freq: Every day | ORAL | Status: DC
  Administered 2015-08-12: 0.5 mg via ORAL
  Filled 2015-08-12: qty 1
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
## 2015-08-12 NOTE — Progress Notes (Signed)
Recreation Therapy Notes  Date: 09.19.16 Time: 3:00 pm Location: Craft Room  Group Topic: Wellness  Goal Area(s) Addresses:  Patient will identify at least one item per each dimension of health. Patient will examine areas they are deficient in.  Behavioral Response: Did not attend  Intervention: 6 Dimensions of Health  Activity: Patients were given a sheet with the definitions of the 6 dimensions. Patients were given a worksheet with the 6 dimensions and were instructed to list at least one item in each dimension.   Education: LRT educated patients on ways to increase each dimension.  Education Outcome: Patient did not attend group.  Clinical Observations/Feedback: Patient did not attend group.        Jacquelynn Cree 08/12/2015 4:17 PM

## 2015-08-12 NOTE — Progress Notes (Signed)
He denies suicidal or homicidal ideation and A/V hallucination.Attended groups.Compliant with meds.Appropriate with staff & peers.Still with sitter for safety.

## 2015-08-12 NOTE — Plan of Care (Signed)
Problem: Ineffective individual coping Goal: STG: Patient will remain free from self harm Outcome: Progressing Pt safe on the unit at this time     

## 2015-08-12 NOTE — Progress Notes (Signed)
D: Pt denies SI/HI/AVH. Pt is pleasant and cooperative. Pt stayed in room most of the evening, pt stated he didn't feel good all day.   A: Pt was offered support and encouragement. Pt was given scheduled medications. Pt was encourage to attend groups. Q 15 minute checks were done for safety.    R: Pt has no complaints.Pt receptive to treatment and safety maintained on unit.

## 2015-08-12 NOTE — [facility] Group Notes (Signed)
[facility] LCSW Group Therapy  08/12/2015 2:56 PM  Type of Therapy:  Group Therapy  Participation Level:  Did Not Attend   Lulu Riding, MSW, LCSWA 08/12/2015, 2:56 PM

## 2015-08-12 NOTE — [facility] Group Notes (Signed)
[facility] Group Notes:  (Nursing/MHT/Case Management/Adjunct)  Date:  08/12/2015  Time:  2:25 PM  Type of Therapy:  Psychoeducational Skills  Participation Level:  Did Not Attend  [name] [facility] 08/12/2015, 2:25 PM

## 2015-08-12 NOTE — Plan of Care (Signed)
Problem: Ineffective individual coping Goal: STG: Patient will remain free from self harm Calm and cooperative. Flat affect. Speech clear. Med compliant. Denies SI/HI. No AV/H noted. Pt remains on 1:1 for safety.

## 2015-08-13 LAB — LITHIUM LEVEL: Lithium Lvl: 0.23 mmol/L — ABNORMAL LOW (ref 0.60–1.20)

## 2015-08-13 MED ORDER — PRIMIDONE 50 MG PO TABS
50.0000 mg | ORAL_TABLET | Freq: Every day | ORAL | Status: DC
  Administered 2015-08-13 – 2015-08-18 (×6): 50 mg via ORAL
  Filled 2015-08-13 (×6): qty 1

## 2015-08-13 MED ORDER — OLANZAPINE 5 MG PO TBDP
30.0000 mg | ORAL_TABLET | Freq: Every day | ORAL | Status: DC
  Administered 2015-08-13 – 2015-08-18 (×6): 30 mg via ORAL
  Filled 2015-08-13 (×7): qty 6

## 2015-08-13 MED ORDER — ZOLPIDEM TARTRATE 5 MG PO TABS
5.0000 mg | ORAL_TABLET | Freq: Every evening | ORAL | Status: DC | PRN
Start: 2015-08-13 — End: 2015-08-14

## 2015-08-13 MED ORDER — ZOLPIDEM TARTRATE 5 MG PO TABS: 5.0000 mg | ORAL_TABLET | Freq: Every day | ORAL | Status: DC
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
## 2015-08-13 NOTE — [facility] Group Notes (Signed)
[facility] Group Notes:  (Nursing/MHT/Case Management/Adjunct)  Date:  08/13/2015  Time:  1:56 PM  Type of Therapy:  Psychoeducational Skills  Participation Level:  Did Not Attend    Kyle [name] 08/13/2015, 1:56 PM

## 2015-08-13 NOTE — Plan of Care (Signed)
Problem: Alteration in mood Goal: LTG-Patient reports reduction in suicidal thoughts (Patient reports reduction in suicidal thoughts and is able to verbalize a safety plan for whenever patient is feeling suicidal)  Outcome: Progressing Denies suicidal ideation.     

## 2015-08-13 NOTE — Progress Notes (Signed)
Physical Therapy Treatment Patient Details Name: Kyle [name] MRN: [id number] DOB: [birth year] Today's Date: 08/13/2015    History of Present Illness Kyle [name] is a 69 y.o. male presents with stated issues from a social situation of living by himself and being out of his medications for the last 2-3 weeks. He states he's had nothing to eat or drink over the last 3 days. He states mostly symptoms consistent with his history of depression that he "doesn't want to live anymore ". He states he has no transportation at home and he states a history of Parkinson's syndrome though there is some documentation of more of an essential tremor. He states he could not charges filed and then sought help by walking along the road until EMS arrived to pick the patient up. He denies any fever or head trauma. He denies any chest or abdominal pain. He denies any hallucinations or homicidal thoughts. Pt provides minimal history to therapist today. Continually repeats, "I just don't want to live like this," and "I don't know" when questioned about his living situation, baseline mobility, and equipment. Pt endorses history of multiple falls but is unwilling/unable to provide further details.     PT Comments    Pt reports general malaise and aches/pains all over today. Pt is agreeable to PT. Flat affect, but carries a conversation and initiates conversation regarding PT several times. Pt progressing ambulation distance. Pt able to demonstrate shorter distance without assistive device without LOB; however, demonstrates decreased confidence staying close to wall/rail and maintains eyes on floor throughout. rolling walker adjusted greater comfort/fit. Initiated stand exercises for strength and balance. Continue PT progressing strength and higher level balance exercises to promote increased safety with functional mobility.   Follow Up Recommendations  Home health PT     Equipment Recommendations  Rolling walker with  5" wheels    Recommendations for Other Services       Precautions / Restrictions Restrictions Weight Bearing Restrictions: No    Mobility  Bed Mobility               General bed mobility comments: Not tested pt up in the bathroom  Transfers                 General transfer comment: Not tested; pt up in bathroom  Ambulation/Gait Ambulation/Gait assistance: Supervision Ambulation Distance (Feet): 520 Feet (120 ft without assistive device) Assistive device: Rolling walker (2 wheeled) Gait Pattern/deviations: WFL(Within Functional Limits) (without AD more cautious stays close to rail/wall) Gait velocity: decreased Gait velocity interpretation: Below normal speed for age/gender General Gait Details: rw level lowered for improved fit; more cautious without AD hovering close to the wall.    Stairs            Wheelchair Mobility    Modified Rankin (Stroke Patients Only)       Balance                                    Cognition Arousal/Alertness: Awake/alert Behavior During Therapy: Flat affect (carries conversation ) Overall Cognitive Status: No family/caregiver present to determine baseline cognitive functioning                      Exercises General Exercises - Lower Extremity Hip ABduction/ADduction: AROM;Strengthening;Both;15 reps;Standing Straight Leg Raises: Strengthening;AROM;Both;15 reps;Standing Hip Flexion/Marching: AROM;Strengthening;Both;20 reps;Standing Other Exercises Other Exercises: hip extension B 15 reps standing  General Comments        Pertinent Vitals/Pain      Home Living                      Prior Function            PT Goals (current goals can now be found in the care plan section) Progress towards PT goals: Progressing toward goals    Frequency  Min 2X/week    PT Plan Current plan remains appropriate    Co-evaluation             End of Session   Activity  Tolerance: Patient tolerated treatment well Patient left: with nursing/sitter in room (in room at edge of bed)     Time: 2841-3244 PT Time Calculation (min) (ACUTE ONLY): 26 min  Charges:  $Gait Training: 8-22 mins $Therapeutic Exercise: 8-22 mins                    G Codes:      Kristeen Miss 08/13/2015, 4:08 PM

## 2015-08-13 NOTE — Progress Notes (Signed)
D: Patient remains to have a 1:1  With him during shift . Appetite good , voice no no concerns around sleep. Limitedparticipating with unit programing . Continue to use walker as means of moving about on the unit . No ADLs this shift . Patient denies suicidal ideation and auditory hallucinations . Limited interaction with his peers  . Concentration  Improving  A: Instruction given on medication , verbalizing understanding. Encourage patient participation and to come to staff for any concerns . R: Receptive to information received , voice no other concerns

## 2015-08-13 NOTE — Tx Team (Signed)
Interdisciplinary Treatment Plan Update (Adult)  Date:  08/13/2015 Time Reviewed:  4:26 PM  Progress in Treatment: Attending groups: No. Participating in groups:  No. Taking medication as prescribed:  Yes. Tolerating medication:  Yes. Family/Significant othe contact made:  Yes, individual(s) contacted:   [name], Pt's sister Patient understands diagnosis:  Yes. Discussing patient identified problems/goals with staff:  Yes. Medical problems stabilized or resolved:  Yes. Denies suicidal/homicidal ideation: Yes. Issues/concerns per patient self-inventory:  Yes. Other:  New problem(s) identified: No, Describe:     Discharge Plan or Barriers: discharge home with [facility] for PT.  Refer to [facility] or [facility] in [place].  REccomended that Pt apply for Medicaid. [facility] said that she is unable to assist Pt as he lives in [place].  Reason for Continuation of Hospitalization: Depression Medication stabilization  Comments:69 y/o divorced Caucasian male with history of bipolar disorder type I. The patient was brought into our emergency department by EMS. The patient explains that since May he had developed worsening tremors and inability to care for himself. Patient says he wasn't sleeping well, was not able to feed himself because of the severe tremors and his gait has become unsteady. Patient states he was diagnosed by his primary care provider and by his psychiatrist with Parkinson's disease which makes him feel like dying. Patient feels like a burden to his family, he does not want to ask any of them for help. He continues to have thoughts of wanting to die as his thyroid living this way, feels that no nobody understand what he is going through, but denies having a plan. Patient states he was recently discharged from a psychiatric hospital in [place] where he was hospitalized for bipolar disorder however he felt that this  hospitalization was not helpful for his symptomatology.   Estimated length of stay: 3-5 days  New goal(s):  Review of initial/current patient goals per problem list:   See plan of Care Attendees: Patient:  Kyle [name] 9/20/20164:26 PM  Family:   9/20/20164:26 PM  Physician:  Radene Journey 9/20/20164:26 PM  Nursing:   Tinnie Gens RN 9/20/20164:26 PM  Case Manager:   9/20/20164:26 PM  Counselor:  Jake Shark, LCSW 9/20/20164:26 PM  Other:  Hershal Coria LRT 9/20/20164:26 PM  Other:   9/20/20164:26 PM  Other:   9/20/20164:26 PM  Other:  9/20/20164:26 PM  Other:  9/20/20164:26 PM  Other:  9/20/20164:26 PM  Other:  9/20/20164:26 PM  Other:  9/20/20164:26 PM  Other:  9/20/20164:26 PM  Other:   9/20/20164:26 PM   Scribe for Treatment Team:   Glennon Mac, 08/13/2015, 4:26 PM

## 2015-08-13 NOTE — Progress Notes (Signed)
Calm and cooperative. C/o gen' pain. Tylenol 650 mg po given for PRN for pain. Pt remains on 1:1 for safety. Med compliant. No inappropriate behavior noted. Denies SI/HI. No AV/H noted. Will continue to monitor behavior.

## 2015-08-13 NOTE — [facility] Group Notes (Signed)
[facility] Group Notes:  (Nursing/MHT/Case Management/Adjunct)  Date:  08/13/2015  Time:  10:44 PM  Type of Therapy:  Group Therapy  Participation Level:  Active  Participation Quality:  Appropriate  Affect:  Appropriate  Cognitive:  Appropriate  Insight:  Appropriate  Engagement in Group:  Engaged  Modes of Intervention:  Discussion  Summary of Progress/Problems:  Burt Ek 08/13/2015, 10:44 PM

## 2015-08-13 NOTE — Progress Notes (Signed)
Recreation Therapy Notes  Date: 09.20.16 Time: 3:00 pm Location: Craft Room  Group Topic: Self-expression  Goal Area(s) Addresses:  Patient will be able to identify a color that represents each emotion. Patient will verbalize benefit of using art as a means of self-expression.  Behavioral Response: Did not attend  Intervention: The Colors Within Me  Activity: Patients were given a blank face worksheet and instructed to pick a color for each emotion they were experiencing and show how much of that color they were feeling on the face.  Education: LRT educated patients on different forms of self-expression   Education Outcome: Patient did not attend group.   Clinical Observations/Feedback: Patient did not attend group.  Jacquelynn Cree, LRT/CTRS 08/13/2015 4:36 PM

## 2015-08-14 MED ORDER — ZOLPIDEM TARTRATE 5 MG PO TABS
5.0000 mg | ORAL_TABLET | Freq: Every day | ORAL | Status: DC
  Administered 2015-08-14: 5 mg via ORAL
  Filled 2015-08-14: qty 1

## 2015-08-14 NOTE — [facility] Suicide Risk Assessment (Signed)
[facility] INPATIENT:  Family/Significant Other Suicide Prevention Education  Suicide Prevention Education:  Education Completed; [name] [phone]  has been identified by the patient as the family member/significant other with whom the patient will be residing, and identified as the person(s) who will aid the patient in the event of a mental health crisis (suicidal ideations/suicide attempt).  With written consent from the patient, the family member/significant other has been provided the following suicide prevention education, prior to the and/or following the discharge of the patient.  The suicide prevention education provided includes the following:  Suicide risk factors  Suicide prevention and interventions  National Suicide Hotline telephone number  [facility] assessment telephone number  [facility] Emergency Assistance 911  [facility] and/or Residential Mobile Crisis Unit telephone number  Request made of family/significant other to:  Remove weapons (e.g., guns, rifles, knives), all items previously/currently identified as safety concern.    Remove drugs/medications (over-the-counter, prescriptions, illicit drugs), all items previously/currently identified as a safety concern.  The family member/significant other verbalizes understanding of the suicide prevention education information provided.  The family member/significant other agrees to remove the items of safety concern listed above.  Daisy Floro Hyatt MSW, LCSWA  08/14/2015, 4:17 PM

## 2015-08-14 NOTE — Plan of Care (Signed)
Problem: Ineffective individual coping Goal: STG: Patient will remain free from self harm Outcome: Not Progressing No injuries noted. amb with walker. Med compliant. Pt remains on 1:1 for safety. No behavior problems noted.

## 2015-08-14 NOTE — Progress Notes (Addendum)
Patient with appropriate affect and cooperative behavior with meals, meds and plan of care. Quiet speech, fair eye contact. Takes meds as ordered and good relief on chronic back ache from prn pain med. Minimal interaction with peers. Verbalizes needs appropriately when staff initiates interaction. Good appetite and good adls. Ambulates with slow and steady gait, needs reminder to use walker. Tolerates ambulation well, remains on 1:1 with staff at side. Remains on falls risk.  Patient appropriate when staff initiates interaction. Mild confusion noted. Safety maintained.

## 2015-08-14 NOTE — [facility] Group Notes (Signed)
[facility] LCSW Group Therapy  08/14/2015 4:29 PM  Type of Therapy:  Group Therapy  Participation Level:  Minimal  Participation Quality:  Attentive  Affect:  Flat  Cognitive:  Alert  Insight:  Limited  Engagement in Therapy:  Limited  Modes of Intervention:  Discussion, Education, Socialization and Support  Summary of Progress/Problems: Pt will identify unhealthy thoughts and how they impact their emotions and behavior. Pt will be encouraged to discuss these thoughts, emotions and behaviors with the group. Kyle [name] states he is experincing back pain which is causing him to feel terrible. He attended group and stayed the entire time. He sat quietly and listened to other group members.   [employer] MSW, LCSWA  08/14/2015, 4:29 PM

## 2015-08-14 NOTE — [facility] Group Notes (Signed)
[facility] LCSW Aftercare Discharge Planning Group Note   08/14/2015 12:35 PM  Participation Quality:   Patient arrived late to group but was able to introduce himself and share his SMART goal of wanting to "feel better" and will do this by taking medications and working with his psychiatrist.   Mood/Affect:  Depressed   Thoughts of Suicide:  No Will you contract for safety?   NA  Current AVH:  No  Plan for Discharge/Comments:  Patient will have mental health followup and has a place to go at discharge.     Lulu Riding, MSW, LCSWA

## 2015-08-14 NOTE — Progress Notes (Signed)
Physical Therapy Treatment Patient Details Name: Kyle [name] MRN: [id number] DOB: 22-Dec-1945 Today's Date: 08/14/2015    History of Present Illness Kyle [name] is a 69 y.o. male presents with stated issues from a social situation of living by himself and being out of his medications for the last 2-3 weeks. He states he's had nothing to eat or drink over the last 3 days. He states mostly symptoms consistent with his history of depression that he "doesn't want to live anymore ". He states he has no transportation at home and he states a history of Parkinson's syndrome though there is some documentation of more of an essential tremor. He states he could not charges filed and then sought help by walking along the road until EMS arrived to pick the patient up. He denies any fever or head trauma. He denies any chest or abdominal pain. He denies any hallucinations or homicidal thoughts. Pt provides minimal history to therapist today. Continually repeats, "I just don't want to live like this," and "I don't know" when questioned about his living situation, baseline mobility, and equipment. Pt endorses history of multiple falls but is unwilling/unable to provide further details.     PT Comments    Pt agreeable to PT; demonstrates a little greater expression and conversation during treatment today. Increased repetitions with standing exercises; continues to require cues for posture and appropriate LE height with exercises. Initiated static stand balance exercises today with moderate challenge. Continue PT for progression of higher level balance exercise/activity to allow for improved functional mobility with least restrictive assistive device. Initiate stair climbing as able on unit.  Follow Up Recommendations  Home health PT     Equipment Recommendations  Rolling walker with 5" wheels    Recommendations for Other Services       Precautions / Restrictions Restrictions Weight Bearing  Restrictions: No    Mobility  Bed Mobility               General bed mobility comments: Not tested  Transfers                 General transfer comment: Not tested  Ambulation/Gait Ambulation/Gait assistance: Supervision Ambulation Distance (Feet): 350 Feet Assistive device: Rolling walker (2 wheeled) Gait Pattern/deviations: WFL(Within Functional Limits) Gait velocity: decreased       Stairs            Wheelchair Mobility    Modified Rankin (Stroke Patients Only)       Balance           Standing balance support: No upper extremity supported Standing balance-Leahy Scale: Fair                      Cognition Arousal/Alertness: Awake/alert Behavior During Therapy:  [employer] conversation; a little more expression today) Overall Cognitive Status: No family/caregiver present to determine baseline cognitive functioning                      Exercises General Exercises - Lower Extremity Hip ABduction/ADduction: AROM;Both;20 reps;Standing;Strengthening Straight Leg Raises: AROM;Strengthening;Both;20 reps;Standing Hip Flexion/Marching: AROM;Strengthening;Both;20 reps;Standing Other Exercises Other Exercises: hip extension, B 20x standing Other Exercises: Stand balance exercises feet together with eyes open/closed, tandem stance R/L with/without UE support, moderate perturbances    General Comments        Pertinent Vitals/Pain      Home Living  Prior Function            PT Goals (current goals can now be found in the care plan section) Progress towards PT goals: Progressing toward goals    Frequency  Min 2X/week    PT Plan Current plan remains appropriate    Co-evaluation             End of Session   Activity Tolerance: Patient tolerated treatment well Patient left: with nursing/sitter in room (in dining room to eat)     Time: 1610-9604 PT Time Calculation (min) (ACUTE ONLY): 23  min  Charges:  $Gait Training: 8-22 mins $Neuromuscular Re-education: 8-22 mins                    G Codes:      Kristeen Miss 08/14/2015, 12:19 PM

## 2015-08-14 NOTE — Progress Notes (Signed)
Pt slept well throughout the night. Calm and cooperative. Attends group. Limited interaction with peers and staff. Med compliant. Denies SI/HI. No c/o pain/discomfort noted.

## 2015-08-14 NOTE — [facility] Group Notes (Signed)
[facility] Group Notes:  (Nursing/MHT/Case Management/Adjunct)  Date:  08/14/2015  Time:  5:23 PM  Type of Therapy:  Psychoeducational Skills  Participation Level:  Active  Participation Quality:  Appropriate, Attentive and Sharing  Affect:  Appropriate  Cognitive:  Appropriate  Insight:  Appropriate  Engagement in Group:  Engaged  Modes of Intervention:  Discussion, Education and Support  Summary of Progress/Problems:  [name] 08/14/2015, 5:23 PM
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
## 2015-08-14 NOTE — Progress Notes (Signed)
Recreation Therapy Notes  Date: 09.21.16 Time: 3:00 pm Location: Craft Room  Group Topic: Self-esteem  Goal Area(s) Addresses:  Patient will write at least one positive trait. Patient will verbalize benefit of having a good self-esteem.  Behavioral Response: Attentive  Intervention: I Am  Activity: Patients were given a worksheet with the letter I on it and instructed to write as many positive traits about themselves inside the I.  Education: LRT educated patients on ways they can increase their self-esteem.   Education Outcome: In group clarification offered  Clinical Observations/Feedback: Patient completed activity by filling approximately 75% of worksheet with positive traits. Patient did not contribute to group discussion.  Jacquelynn Cree, LRT/CTRS 08/14/2015 4:50 PM

## 2015-08-15 MED ORDER — LORAZEPAM 0.5 MG PO TABS
0.5000 mg | ORAL_TABLET | Freq: Every day | ORAL | Status: DC
  Administered 2015-08-15 – 2015-08-18 (×4): 0.5 mg via ORAL
  Filled 2015-08-15 (×4): qty 1

## 2015-08-15 MED ORDER — METFORMIN HCL 500 MG PO TABS
500.0000 mg | ORAL_TABLET | Freq: Two times a day (BID) | ORAL | Status: DC
  Administered 2015-08-15 – 2015-08-19 (×8): 500 mg via ORAL
  Filled 2015-08-15 (×8): qty 1

## 2015-08-15 MED ORDER — HYDROXYZINE HCL 50 MG PO TABS
50.0000 mg | ORAL_TABLET | Freq: Every evening | ORAL | Status: DC | PRN
  Administered 2015-08-15: 50 mg via ORAL
  Filled 2015-08-15: qty 1

## 2015-08-15 NOTE — Progress Notes (Signed)
Denies depression & suicidal ideation.Tylenol helped with generalized pain.Still with sitter for safety.Appropriate with staff & peers.No distress noted.Appetite good.

## 2015-08-15 NOTE — Progress Notes (Signed)
PT Cancellation Note  Patient Details Name: Kyle [name] MRN: [id number] DOB: [birth year]   Cancelled Treatment:    Reason Eval/Treat Not Completed: Patient at procedure or test/unavailable (Treatment session attempted.  Patient currently on his way to a group session; unavailable for participation with session.  Will re-attempt at later time/date as patient available.)   Kristen H. Manson Passey, PT, DPT, NCS 08/15/2015, 3:24 PM [phone]

## 2015-08-15 NOTE — [facility] Group Notes (Signed)
[facility] LCSW Group Therapy  08/15/2015 4:11 PM  Type of Therapy:  Group Therapy  Participation Level:  Active  Participation Quality:  Appropriate and Attentive  Affect:  Appropriate  Cognitive:  Alert, Appropriate and Oriented  Insight:  Improving  Engagement in Therapy:  Improving  Modes of Intervention:  Socialization and Support  Summary of Progress/Problems: Patient attended and participated in group discussion and was attentive thouughout the entire group. Patient shared that he has a strong faith background and has regrets in his life that cause him to experience depression. Patient received supportive feedback from other group members.   Lulu Riding, MSW, LCSWA 08/15/2015, 4:11 PM

## 2015-08-15 NOTE — Plan of Care (Signed)
Problem: Diagnosis: Increased Risk For Suicide Attempt Goal: LTG-Patient Will Report Improved Mood and Deny Suicidal LTG (by discharge) Patient will report improved mood and deny suicidal ideation.  Outcome: Progressing Denies suicidal ideation.     

## 2015-08-15 NOTE — Plan of Care (Signed)
Problem: Ineffective individual coping Goal: STG: Patient will remain free from self harm Outcome: Not Progressing Pt remains on 1:1 for safety. No injuries noted. Med compliant. No voiced thoughts of hurting himself. No c/o pain/discomfort noted.

## 2015-08-15 NOTE — Progress Notes (Signed)
Pt remains on 1:1 for falls risk and safety. Calm and cooperative. Flat affect. States he  can't sleep. Dr Toni Amend notified. N.O.  Vistaril 50 mg q HS PRN. tol po medication well. Denies SI/HI. No AV/H noted. No further complaints offered. No behavior problems noted.

## 2015-08-15 NOTE — Tx Team (Signed)
Interdisciplinary Treatment Plan Update (Adult)  Date:  08/15/2015 Time Reviewed:  4:42 PM  Progress in Treatment: Attending groups: Yes. Participating in groups:  Yes. Taking medication as prescribed:  Yes. Tolerating medication:  Yes. Family/Significant othe contact made:  Yes, individual(s) contacted:  [name]  Patient understands diagnosis:  No. and As evidenced by:  Limited insight  Discussing patient identified problems/goals with staff:  Yes. Medical problems stabilized or resolved:  Yes. Denies suicidal/homicidal ideation: Yes. Issues/concerns per patient self-inventory:  No. Other:  New problem(s) identified: No, Describe:  NA  Discharge Plan or Barriers: Pt will return home and follow up with outpatient and home health.   Reason for Continuation of Hospitalization: Depression Medication stabilization Suicidal ideation  Comments:Patient still reports feeling very weak and tired, he states he did not sleep well last night. He fell asleep without difficulty but woke up around 1 AM and receive a Vistaril. Even though the patient is still reports feeling bad he has been seeing more often out of his room, he has been attending groups he has been interacting better with peers and staff. He denies side effects from medications. As far as physical complaints he reports improvement on the severity of the tremors. His balance seems much improved; he continues to complain of neuropathic pain on his lower extremities. Patient denies today SI, HI or having auditory or visual hallucinations.  Team discussed with the sister and with the patient The need for placement or supervision if discharged back home. Patient is chronically noncompliant with medications which has led to a multitude of hospitalizations over the last 2-3 years. Sister is states that she has not seen him or touch base with him in several months as he will not answer her phone calls. Patient currently only  receives Medicare which will not pay for placement in a assisted living facility. Social worker will try to start the process of applying for Medicaid here in the hospital. In the meantime it is possible that the patient's ex-wife "stay in the house with him and care for him until he can be placed, hopefully in the near future. Social worker is also attempting to refer the patient for ACT team services however because he does not have Medicaid there is a waiting list for I PRS patients. At this time we have requested home health for PT, medication management and social work assistance. Per nursing: Pt remains on 1:1 for falls risk and safety. Calm and cooperative. Flat affect. States he can't sleep. Dr Weber Cooks notified. N.O. Vistaril 50 mg q HS PRN. tol po medication well. Denies SI/HI. No AV/H noted. No further complaints offered. No behavior problems noted.   Estimated length of stay: Pt will likely leave on Monday   New goal(s): NA  Review of initial/current patient goals per problem list:   1.  Goal(s): Patient will participate in aftercare plan * Met:  * Target date: at discharge * As evidenced by: Patient will participate within aftercare plan AEB aftercare provider and housing plan at discharge being identified.   2.  Goal (s): Patient will exhibit decreased depressive symptoms and suicidal ideations. * Met:  *  Target date: at discharge * As evidenced by: Patient will utilize self rating of depression at 3 or below and demonstrate decreased signs of depression or be deemed stable for discharge by MD.  Attendees: Patient:  Kyle [name] 9/22/20164:42 PM  Family:   9/22/20164:42 PM  Physician:  Dr. Jerilee Hoh  9/22/20164:42 PM  Nursing:  Junita Push, RN  9/22/20164:42 PM  Case Manager:   9/22/20164:42 PM  Counselor:   9/22/20164:42 PM  Other:  Wray Kearns,  [place]  9/22/20164:42 PM  Other:  Everitt Amber, LRT  9/22/20164:42 PM  Other:   9/22/20164:42 PM  Other:  9/22/20164:42 PM   Other:  9/22/20164:42 PM  Other:  9/22/20164:42 PM  Other:  9/22/20164:42 PM  Other:  9/22/20164:42 PM  Other:  9/22/20164:42 PM  Other:   9/22/20164:42 PM   Scribe for Treatment Team:   Wray Kearns MSW, [place] , 08/15/2015, 4:42 PM

## 2015-08-15 NOTE — Progress Notes (Signed)
Recreation Therapy Notes  Date: 09.22.16 Time: 3:00 pm Location: Craft Room  Group Topic: Leisure Education  Goal Area(s) Addresses:  Patient will identify activities for each letter of the alphabet. Patient will verbalize ability to integrate positive leisure into life post d/c. Patient will verbalize ability to use leisure as a Associate Professor.  Behavioral Response: Attentive  Intervention: Leisure Alphabet  Activity: Patients were given a leisure Information systems manager and instructed to list healthy leisure activities for each letter of the alphabet.  Education: LRT educated patients on what they needed to participate in leisure activities.   Education Outcome: In group clarification offered  Clinical Observations/Feedback: Patient completed activity by filling approximately 95% of worksheet. Patient did not contribute to group discussion.  Jacquelynn Cree, LRT/CTRS 08/15/2015 4:57 PM
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
## 2015-08-16 MED ORDER — OLANZAPINE 15 MG PO TABS: 30.0000 mg | ORAL_TABLET | Freq: Every day | ORAL | Status: DC

## 2015-08-16 MED ORDER — METFORMIN HCL 500 MG PO TABS: 500.0000 mg | ORAL_TABLET | Freq: Two times a day (BID) | ORAL | Status: DC

## 2015-08-16 MED ORDER — PRIMIDONE 50 MG PO TABS: 50.0000 mg | ORAL_TABLET | Freq: Every day | ORAL | Status: DC

## 2015-08-16 MED ORDER — LORAZEPAM 0.5 MG PO TABS: 0.5000 mg | ORAL_TABLET | Freq: Every day | ORAL | Status: DC

## 2015-08-16 MED ORDER — LIDOCAINE 5 % EX PTCH: 1.0000 | MEDICATED_PATCH | CUTANEOUS | Status: DC

## 2015-08-16 MED ORDER — LIDOCAINE 5 % EX PTCH
1.0000 | MEDICATED_PATCH | CUTANEOUS | Status: DC
  Administered 2015-08-16 – 2015-08-19 (×4): 1 via TRANSDERMAL
  Filled 2015-08-16 (×4): qty 1

## 2015-08-16 NOTE — Plan of Care (Signed)
Problem: Ineffective individual coping Goal: STG: Patient will remain free from self harm Outcome: Progressing Patient has not harmed himself

## 2015-08-16 NOTE — Progress Notes (Signed)
Recreation Therapy Notes  Date: 09.23.16 Time: 3:00 pm Location: Craft Room  Group Topic: Problem solving, communication, teamwork  Goal Area(s) Addresses:  Patient will effectively work with peer towards shared goal. Patient will identify skills used to make activity successful. Patient will identify benefit of using group skills effectively post d/c.  Behavioral Response: Attentive, Interactive  Intervention: [employer]  Activity: Patients were divided up into two groups. Patients were instructed to build a free standing tower out of 15 pipe cleaners. After about 5 minutes of patients building, patients were instructed to put their dominant hand behind their back. After another 5 minutes, patients were instructed not to talk to each other.  Education: LRT educated patients on the importance of communication, problem solving, and teamwork   Education Outcome: In group clarification offered  Clinical Observations/Feedback: Patient worked with team to build tower. Patient used effective communication, problem solving, and teamwork skills. Patient contributed to group discussion by stating what skills he used during group.  Jacquelynn Cree, LRT/CTRS 08/16/2015 4:57 PM

## 2015-08-16 NOTE — BHH Group Notes (Signed)
[facility] LCSW Aftercare Discharge Planning Group Note   08/16/2015 6:51 PM  Participation Quality:  Minimal   Mood/Affect:  Flat  Depression Rating:  0  Anxiety Rating:  0  Thoughts of Suicide:  No Will you contract for safety?   NA  Current AVH:  NA  Plan for Discharge/Comments:  Pt reports feeling terrible physically. He denies depression and anxiety. However, he is worried about being d/c on Monday. He plans to return home and follow up with outpatient and home health.   Transportation Means: Family    Supports: family   Candace Caryl Comes MSW, [address]

## 2015-08-16 NOTE — Progress Notes (Signed)
This is a 68 y.o. Male, who reports this shift of having some back pain; "my back hurts all of the time." Client remains to have 1:1 sitter for safety/falls risk; to be calm and cooperative; denies s.i./h.i.; took meds without difficulty; continue to monitor client's status; v/s: 98.6; 20; 82; 138/87.
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
## 2015-08-16 NOTE — [facility] Group Notes (Signed)
[facility] Group Notes:  (Nursing/MHT/Case Management/Adjunct)  Date:  08/16/2015  Time:  11:51 AM  Type of Therapy:  Psychoeducational Skills  Participation Level:  Did Not Attend    Mickey Farber 08/16/2015, 11:51 AM

## 2015-08-16 NOTE — Progress Notes (Signed)
Patient is alert and oriented, no signs of acute mental distress, patient endorse pain in his lower back, patient has lidocaine patch ordered. Patient is medication compliant at this time.  Patient denies HI/AVH at this time.  Patient states he does have some passive SI, but no plan and patient agrees not to harm self while he is here. Patient has attended groups to day and interacts minimally with is peers.  Patient did not complete the self assessment form today. Will continue to monitor throughout the remainder of the shift.

## 2015-08-17 MED ORDER — CYCLOBENZAPRINE HCL 10 MG PO TABS
5.0000 mg | ORAL_TABLET | Freq: Every day | ORAL | Status: DC
  Administered 2015-08-17 – 2015-08-18 (×2): 5 mg via ORAL
  Filled 2015-08-17: qty 2
  Filled 2015-08-17: qty 1

## 2015-08-17 NOTE — Progress Notes (Signed)
D: Pt is awake and active in the milieu this evening. Pt mood is depressed and his affect is sad. Pt endorses passive SI but does contract for safety. Pt is ambulating in his room and tolerating it well. Pt has remained in his room throughout the evening.   A: Writer provided emotional support and administered medications as prescribed.  R: Pt behavior is appropriate on the unit, and he went to sleep following medication administration.

## 2015-08-17 NOTE — [facility] Group Notes (Signed)
[facility] Group Notes:  (Nursing/MHT/Case Management/Adjunct)  Date:  08/17/2015  Time:  3:37 AM  Type of Therapy:  Group Therapy  Participation Level:  Did Not Attend    Veva Holes 08/17/2015, 3:37 AM

## 2015-08-17 NOTE — [facility] Group Notes (Signed)
[facility] LCSW Group Therapy  08/17/2015 3:56 PM  Type of Therapy:  Group Therapy  Participation Level:  Minimal  Participation Quality:  Attentive  Affect:  Flat   Cognitive:  Alert  Insight:  Limited  Engagement in Therapy:  Limited  Modes of Intervention:  Discussion, Education, Socialization and Support  Summary of Progress/Problems: Balance in life: Patients will discuss the concept of balance and how it looks and feels to be unbalanced. Pt will identify areas in their life that is unbalanced and ways to become more balanced. Pt attended group and stayed the entire time. He sat quietly and listened to other group members.   Kyle [name] L Zondra Lawlor MSW, LCSWA  08/17/2015, 3:56 PM  

## 2015-08-17 NOTE — Progress Notes (Signed)
D: Patient denies SI/HI/AVH. Patient affect and mood is sad and depressed Patient did attend some groups today. Patient interaction with staff and other patients is appropriate. He is medication complaint.  He has lidocaine patch on his lower back.  Patient currently endorse 5/10 pain in his back and generalized.  Patient visible on the milieu. No distress noted. A: Support and encouragement offered. He remains on 1:1 for safety.  R: Patient receptive. Patient remains safe on the unit.
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
## 2015-08-17 NOTE — [facility] Group Notes (Signed)
[facility] Group Notes:  (Nursing/MHT/Case Management/Adjunct)  Date:  08/17/2015  Time:  9:36 PM  Type of Therapy:  Evening Wrap-up Group  Participation Level:  Did Not Attend  Participation Quality:  N/A  Affect:  N/A  Cognitive:  N/A  Insight:  None  Engagement in Group:  Did Not Attend  Modes of Intervention:  Discussion  Summary of Progress/Problems:  Tomasita Morrow 08/17/2015, 9:36 PM

## 2015-08-18 NOTE — Plan of Care (Signed)
Problem: Alteration in mood; excessive anxiety as evidenced by: Goal: LTG-Patient's behavior demonstrates decreased anxiety (Patient's behavior demonstrates anxiety and he/she is utilizing learned coping skills to deal with anxiety-producing situations)  Outcome: Progressing Pt mood is less anxious. He is calm and no longer trembling.

## 2015-08-18 NOTE — Progress Notes (Signed)
Patient is alert and oriented. No signs of acute distress noted. Patient spent most of his day in bed.  He endorses 8/10 pain in his lower back. Patient has lidocaine patch applied. Patient has 1:1 sitter for safety.  He does attend group and goes to the dayroom for meals. Patient is medication complaint at this time.  He denies SI/HI/AVH at this time.  Patients affect remain flat and his mood is depressed. Patient is scheduled to be discharged tomorrow. Will continue to monitor throughout the remainder of the shift.

## 2015-08-18 NOTE — [facility] Group Notes (Signed)
[facility] LCSW Group Therapy  08/18/2015 3:06 PM  Type of Therapy:  Group Therapy  Participation Level:  Minimal  Participation Quality:  Attentive  Affect:  Flat  Cognitive:  Alert  Insight:  Limited  Engagement in Therapy:  Limited  Modes of Intervention:  Discussion, Education, Socialization and Support  Summary of Progress/Problems: Communications: Patients identify how individuals communicate with one another appropriately and inappropriately. Patients will be guided to discuss their thoughts, feelings, and behaviors related to barriers when communicating. The group will process together ways to execute positive and appropriate communications. Pt attended group and stayed the entire time. He sat quietly and listened to other group members.   Candace L Hyatt  MSW, LCSWA   08/18/2015, 3:06 PM 

## 2015-08-18 NOTE — Plan of Care (Signed)
Problem: Alteration in mood Goal: LTG-Patient reports reduction in suicidal thoughts (Patient reports reduction in suicidal thoughts and is able to verbalize a safety plan for whenever patient is feeling suicidal)  Outcome: Progressing Patient denies SI at this time.      

## 2015-08-19 NOTE — Progress Notes (Signed)
Recreation Therapy Notes  Date: 09.26.16 Time: 3:00 pm Location: Craft Room  Group Topic: Self-expression  Goal Area(s) Addresses:  Patient will identify one color per emotion listed on wheel. Patient will verbalize benefit of using art as a means of self-expression. Patient will verbalize one emotion experienced during session. Patient will be educated on other forms of self-expression.  Behavioral Response: Attentive, Interactive  Intervention: Emotion Wheel  Activity: Patients were given a worksheet with 7 different emotions and instructed to pick a color for each emotion.  Education: LRT educated patients on other forms of self-expression.  Education Outcome: In group clarification offered  Clinical Observations/Feedback: Patient completed activity by picking a color for each emotion. Patient contributed to group discussion by stating what colors he picked for certain emotions.  Jacquelynn Cree, LRT/CTRS 08/19/2015 4:51 PM
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
## 2015-08-19 NOTE — [facility] Group Notes (Signed)
[facility] Group Notes:  (Nursing/MHT/Case Management/Adjunct)  Date:  08/19/2015  Time:  2:58 PM  Type of Therapy:  Psychoeducational Skills  Participation Level:  Active  Participation Quality:  Appropriate, Attentive and Sharing  Affect:  Appropriate  Cognitive:  Appropriate  Insight:  Appropriate  Engagement in Group:  Engaged  Modes of Intervention:  Discussion, Education and Support  Summary of Progress/Problems:  Kyle [name] [facility] 08/19/2015, 2:58 PM

## 2015-08-19 NOTE — Progress Notes (Signed)
Patient discharged home with sister. Instructions reviewed and patient verbalized understanding. Denies SI/HI/AVH. Belongings returned.

## 2015-08-19 NOTE — Progress Notes (Signed)
D: Pt is awake in bed this evening. Pt mood is depressed and his affect is sad/flat. Pt denies SI/HI and AVH at this time. Pt has minimal interaction, but is pleasant and cooperative with staff.   A: Writer provided emotional support and administered medications as prescribed. Writer informed pt that 1:1 observation order for fall risk would be discontinued, but pt should request assistance if needed.   R: Provider cancelled 1:1 order as requested. Pt has remained in his room throughout the evening. Pt went to sleep following medication administration.

## 2015-08-19 NOTE — [facility] Suicide Risk Assessment (Signed)
[facility] Discharge Suicide Risk Assessment   Demographic Factors:  Male, Age 69 or older, Divorced or widowed, Caucasian, Low socioeconomic status and Living alone  Total Time spent with patient: 30 minutes    Psychiatric Specialty Exam: Physical Exam  ROS                                                         Have you used any form of tobacco in the last 30 days? (Cigarettes, Smokeless Tobacco, Cigars, and/or Pipes): No  Has this patient used any form of tobacco in the last 30 days? (Cigarettes, Smokeless Tobacco, Cigars, and/or Pipes) No  Mental Status Per Nursing Assessment::   On Admission:  Suicidal ideation indicated by patient  Current Mental Status by Physician: denies hopelessness, helplessness, and denies SI, HI, auditory or visual hallucinations, problems with sleep or anxiety. There is no evidence of agitation, or aggression. No mania, hypomania, or psychosis. Has a supportive family.  Loss Factors: Decline in physical health  Historical Factors: Prior suicide attempts and Family history of mental illness or substance abuse  Risk Reduction Factors:   Sense of responsibility to family and Positive social support  Continued Clinical Symptoms:  Bipolar Disorder:   Depressive phase Previous Psychiatric Diagnoses and Treatments Medical Diagnoses and Treatments/Surgeries  Cognitive Features That Contribute To Risk:  None    Suicide Risk:  Minimal: No identifiable suicidal ideation.  Patients presenting with no risk factors but with morbid ruminations; may be classified as minimal risk based on the severity of the depressive symptoms  Principal Problem: Bipolar I disorder depressed with melancholic features Discharge Diagnoses:  Patient Active Problem List   Diagnosis Date Noted  . RLS (restless legs syndrome) [G25.81] 08/08/2015  . Peripheral neuropathy [G62.9] 08/08/2015  . Cerebrovascular disease [I67.9] 08/07/2015  . Bipolar I disorder  depressed with melancholic features [F31.30] 08/01/2015  . Essential tremor [G25.0] 08/01/2015  . Hypertension [I10] 04/17/2015    Follow-up Information    Follow up with [facility] on 08/22/2015.   Contact information:   [address] [place] [address] [phone]       Follow up with [facility] On 08/22/2015.   Why:  1:20 pm   Contact information:   [address] [place] [address] [phone]        Is patient on multiple antipsychotic therapies at discharge:  No   Has Patient had three or more failed trials of antipsychotic monotherapy by history:  No  Recommended Plan for Multiple Antipsychotic Therapies: NA    Kyle [name] 08/19/2015, 9:18 AM
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
## 2015-08-19 NOTE — Progress Notes (Signed)
  [facility] Adult Case Management Discharge Plan :  Will you be returning to the same living situation after discharge:  Yes,    At discharge, do you have transportation home?: Yes,   sister to pick up Do you have the ability to pay for your medications: Yes,     Release of information consent forms completed and in the chart;  Patient's signature needed at discharge.  Patient to Follow up at: Follow-up Information    Follow up with [facility] On 08/22/2015.   Why:  1:20 pm   Contact information:   [address] [place] [address] [phone]       Follow up with [facility] on 09/25/2015.   Why:  9:30 am,  Neurology will see Dr. Wilnette Kales information:   [address] Physical Therapy [place] [address] [phone]       Follow up with [facility] on 08/21/2015.   Why:  Hospital Follow up, Outpatient Medication Management, Therapy, Please take your photo ID, Insurance card, and Social Security Card,    Contact information:   [phone], [place], [place] [address] Phone: [phone]      Patient denies SI/HI: Yes,       Safety Planning and Suicide Prevention discussed: Yes,     Have you used any form of tobacco in the last 30 days? (Cigarettes, Smokeless Tobacco, Cigars, and/or Pipes): No  Has patient been referred to the Quitline?: N/A patient is not a smoker  Jake Shark P,MSW, LCSW 08/19/2015, 3:09 PM

## 2015-09-05 ENCOUNTER — Emergency Department
Admission: EM | Admit: 2015-09-05 | Discharge: 2015-09-06 | Disposition: A | Discharge status: Other Acute Inpatient Hospital | Payer: Medicare Other | Attending: Emergency Medicine | Admitting: Emergency Medicine

## 2015-09-05 DIAGNOSIS — F3131 Bipolar disorder, current episode depressed, mild: Secondary | ICD-10-CM | POA: Insufficient documentation

## 2015-09-05 DIAGNOSIS — Z7982 Long term (current) use of aspirin: Secondary | ICD-10-CM | POA: Insufficient documentation

## 2015-09-05 DIAGNOSIS — Z79899 Other long term (current) drug therapy: Secondary | ICD-10-CM | POA: Insufficient documentation

## 2015-09-05 DIAGNOSIS — M549 Dorsalgia, unspecified: Secondary | ICD-10-CM | POA: Insufficient documentation

## 2015-09-05 DIAGNOSIS — I679 Cerebrovascular disease, unspecified: Secondary | ICD-10-CM | POA: Diagnosis present

## 2015-09-05 DIAGNOSIS — I10 Essential (primary) hypertension: Secondary | ICD-10-CM | POA: Diagnosis present

## 2015-09-05 DIAGNOSIS — F3130 Bipolar disorder, current episode depressed, mild or moderate severity, unspecified: Secondary | ICD-10-CM

## 2015-09-05 DIAGNOSIS — G250 Essential tremor: Secondary | ICD-10-CM | POA: Diagnosis present

## 2015-09-05 DIAGNOSIS — G8929 Other chronic pain: Secondary | ICD-10-CM | POA: Insufficient documentation

## 2015-09-05 LAB — COMPREHENSIVE METABOLIC PANEL
ALK PHOS: 65 U/L (ref 38–126)
ALT: 23 U/L (ref 17–63)
ANION GAP: 8 (ref 5–15)
AST: 26 U/L (ref 15–41)
Albumin: 3.9 g/dL (ref 3.5–5.0)
BUN: 8 mg/dL (ref 6–20)
CALCIUM: 9.1 mg/dL (ref 8.9–10.3)
CO2: 25 mmol/L (ref 22–32)
Chloride: 103 mmol/L (ref 101–111)
Creatinine, Ser: 0.67 mg/dL (ref 0.61–1.24)
Glucose, Bld: 106 mg/dL — ABNORMAL HIGH (ref 65–99)
Potassium: 3.4 mmol/L — ABNORMAL LOW (ref 3.5–5.1)
SODIUM: 136 mmol/L (ref 135–145)
TOTAL PROTEIN: 7.3 g/dL (ref 6.5–8.1)
Total Bilirubin: 0.7 mg/dL (ref 0.3–1.2)

## 2015-09-05 LAB — URINALYSIS COMPLETE WITH MICROSCOPIC (ARMC ONLY)
BACTERIA UA: NONE SEEN
BILIRUBIN URINE: NEGATIVE
GLUCOSE, UA: NEGATIVE mg/dL
Ketones, ur: NEGATIVE mg/dL
NITRITE: NEGATIVE
Protein, ur: NEGATIVE mg/dL
Specific Gravity, Urine: 1.003 — ABNORMAL LOW (ref 1.005–1.030)
Squamous Epithelial / LPF: NONE SEEN
pH: 7 (ref 5.0–8.0)

## 2015-09-05 LAB — URINE DRUG SCREEN, QUALITATIVE (ARMC ONLY)
Amphetamines, Ur Screen: NOT DETECTED
BARBITURATES, UR SCREEN: NOT DETECTED
Benzodiazepine, Ur Scrn: NOT DETECTED
CANNABINOID 50 NG, UR ~~LOC~~: NOT DETECTED
Cocaine Metabolite,Ur ~~LOC~~: NOT DETECTED
MDMA (ECSTASY) UR SCREEN: NOT DETECTED
Methadone Scn, Ur: NOT DETECTED
Opiate, Ur Screen: NOT DETECTED
PHENCYCLIDINE (PCP) UR S: NOT DETECTED
TRICYCLIC, UR SCREEN: NOT DETECTED

## 2015-09-05 LAB — LITHIUM LEVEL

## 2015-09-05 LAB — CBC WITH DIFFERENTIAL/PLATELET
BASOS PCT: 0 %
Basophils Absolute: 0 10*3/uL (ref 0–0.1)
EOS ABS: 0.2 10*3/uL (ref 0–0.7)
Eosinophils Relative: 1 %
HCT: 38 % — ABNORMAL LOW (ref 40.0–52.0)
HEMOGLOBIN: 13.2 g/dL (ref 13.0–18.0)
Lymphocytes Relative: 24 %
Lymphs Abs: 2.5 10*3/uL (ref 1.0–3.6)
MCH: 32.6 pg (ref 26.0–34.0)
MCHC: 34.8 g/dL (ref 32.0–36.0)
MCV: 93.8 fL (ref 80.0–100.0)
Monocytes Absolute: 0.9 10*3/uL (ref 0.2–1.0)
Monocytes Relative: 8 %
NEUTROS PCT: 67 %
Neutro Abs: 7 10*3/uL — ABNORMAL HIGH (ref 1.4–6.5)
PLATELETS: 263 10*3/uL (ref 150–440)
RBC: 4.06 MIL/uL — AB (ref 4.40–5.90)
RDW: 13 % (ref 11.5–14.5)
WBC: 10.6 10*3/uL (ref 3.8–10.6)

## 2015-09-05 LAB — ETHANOL: Alcohol, Ethyl (B): <5 mg/dL (ref <5)

## 2015-09-05 MED ORDER — ASPIRIN 81 MG PO CHEW
81.0000 mg | CHEWABLE_TABLET | Freq: Every day | ORAL | Status: DC
  Filled 2015-09-05: qty 1

## 2015-09-05 MED ORDER — LITHIUM CARBONATE ER 300 MG PO TBCR
600.0000 mg | EXTENDED_RELEASE_TABLET | Freq: Every day | ORAL | Status: DC
  Administered 2015-09-06: 600 mg via ORAL
  Filled 2015-09-05: qty 2

## 2015-09-05 MED ORDER — PRIMIDONE 50 MG PO TABS
50.0000 mg | ORAL_TABLET | Freq: Every day | ORAL | Status: DC
  Administered 2015-09-06: 50 mg via ORAL
  Filled 2015-09-05 (×2): qty 1

## 2015-09-05 MED ORDER — ROPINIROLE HCL 0.25 MG PO TABS
0.2500 mg | ORAL_TABLET | Freq: Every day | ORAL | Status: DC
Start: 2015-09-05 — End: 2015-09-06
  Administered 2015-09-06: 0.25 mg via ORAL
  Filled 2015-09-05 (×2): qty 1

## 2015-09-05 MED ORDER — METFORMIN HCL 500 MG PO TABS
500.0000 mg | ORAL_TABLET | Freq: Two times a day (BID) | ORAL | Status: DC
  Administered 2015-09-06: 500 mg via ORAL
  Filled 2015-09-05: qty 1

## 2015-09-05 MED ORDER — SIMVASTATIN 10 MG PO TABS: 10.0000 mg | ORAL_TABLET | Freq: Every day | ORAL | Status: DC

## 2015-09-05 MED ORDER — LORAZEPAM 0.5 MG PO TABS
0.5000 mg | ORAL_TABLET | Freq: Every day | ORAL | Status: DC
  Administered 2015-09-06: 0.5 mg via ORAL
  Filled 2015-09-05: qty 1

## 2015-09-05 MED ORDER — PROPRANOLOL HCL ER 80 MG PO CP24
80.0000 mg | ORAL_CAPSULE | Freq: Every day | ORAL | Status: DC
  Administered 2015-09-06: 80 mg via ORAL
  Filled 2015-09-05: qty 1

## 2015-09-05 MED ORDER — GABAPENTIN 300 MG PO CAPS
600.0000 mg | ORAL_CAPSULE | Freq: Every day | ORAL | Status: DC
  Administered 2015-09-06: 600 mg via ORAL
  Filled 2015-09-05: qty 2

## 2015-09-05 MED ORDER — AMLODIPINE BESYLATE 5 MG PO TABS
10.0000 mg | ORAL_TABLET | Freq: Every day | ORAL | Status: DC
  Administered 2015-09-06: 10 mg via ORAL
  Filled 2015-09-05: qty 2

## 2015-09-05 MED ORDER — OLANZAPINE 10 MG PO TABS
30.0000 mg | ORAL_TABLET | Freq: Every day | ORAL | Status: DC
  Administered 2015-09-06: 30 mg via ORAL
  Filled 2015-09-05: qty 3

## 2015-09-05 MED ORDER — LIDOCAINE 5 % EX PTCH
1.0000 | MEDICATED_PATCH | CUTANEOUS | Status: DC
  Administered 2015-09-06: 1 via TRANSDERMAL
  Filled 2015-09-05 (×3): qty 1
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
## 2015-09-05 NOTE — ED Notes (Signed)
BEHAVIORAL HEALTH ROUNDING Patient sleeping: No. Patient alert and oriented: yes Behavior appropriate: Yes.  ; If no, describe:  Nutrition and fluids offered: Yes  Toileting and hygiene offered: Yes  Sitter present: yes Law enforcement present: Yes  

## 2015-09-05 NOTE — ED Notes (Signed)
BEHAVIORAL HEALTH ROUNDING Patient sleeping: Yes.   Patient alert and oriented: no Behavior appropriate: Yes.  ; If no, describe:  Nutrition and fluids offered: No Toileting and hygiene offered: No Sitter present: yes Law enforcement present: Yes  

## 2015-09-05 NOTE — ED Notes (Signed)
Report received from Nellie RN. Patient care assumed. Patient/RN introduction complete. Will continue to monitor.  

## 2015-09-05 NOTE — ED Notes (Signed)
Pt. transfered to BHU without incident after report from. Placed in room and oriented to unit. Pt. informed that for their safety all care areas are designed for safety and monitored by security cameras at all times; and visiting hours explained to patient. Patient verbalizes understanding, and verbal contract for safety obtained.   

## 2015-09-05 NOTE — ED Notes (Signed)
Pt bib EMS via EMS w/ c/o not being able to walk.  [facility]  Pt able to ambulate to EMS truck.  Pt recently dx w/ depression.  Pt sts he has thoughts of hurting himself but no plan.

## 2015-09-05 NOTE — ED Notes (Signed)
BEHAVIORAL HEALTH ROUNDING Patient sleeping: No. Patient alert and oriented: yes Behavior appropriate: Yes.  ; If no, describe:  Nutrition and fluids offered: Yes  Toileting and hygiene offered: Yes  Sitter present: yes Law enforcement present: Yes   ENVIRONMENTAL ASSESSMENT Potentially harmful objects out of patient reach: Yes.   Personal belongings secured: Yes.   Patient dressed in hospital provided attire only: Yes.   Plastic bags out of patient reach: Yes.   Patient care equipment (cords, cables, call bells, lines, and drains) shortened, removed, or accounted for: Yes.   Equipment and supplies removed from bottom of stretcher: Yes.   Potentially toxic materials out of patient reach: Yes.     Sharps container removed or out of patient reach: Yes.   

## 2015-09-06 ENCOUNTER — Inpatient Hospital Stay
Admission: EM | Admit: 2015-09-06 | Discharge: 2015-09-20 | DRG: 885 | Disposition: A | Discharge status: Home / Self Care | Payer: Medicare Other | Source: Intra-hospital | Attending: Psychiatry | Admitting: Psychiatry

## 2015-09-06 DIAGNOSIS — E8881 Metabolic syndrome: Secondary | ICD-10-CM | POA: Diagnosis present

## 2015-09-06 DIAGNOSIS — Z820 Family history of epilepsy and other diseases of the nervous system: Secondary | ICD-10-CM | POA: Diagnosis not present

## 2015-09-06 DIAGNOSIS — F319 Bipolar disorder, unspecified: Secondary | ICD-10-CM | POA: Diagnosis present

## 2015-09-06 DIAGNOSIS — G2581 Restless legs syndrome: Secondary | ICD-10-CM | POA: Diagnosis present

## 2015-09-06 DIAGNOSIS — Z9114 Patient's other noncompliance with medication regimen: Secondary | ICD-10-CM

## 2015-09-06 DIAGNOSIS — R45851 Suicidal ideations: Secondary | ICD-10-CM | POA: Diagnosis present

## 2015-09-06 DIAGNOSIS — Z7982 Long term (current) use of aspirin: Secondary | ICD-10-CM | POA: Diagnosis not present

## 2015-09-06 DIAGNOSIS — Z9049 Acquired absence of other specified parts of digestive tract: Secondary | ICD-10-CM | POA: Diagnosis not present

## 2015-09-06 DIAGNOSIS — G629 Polyneuropathy, unspecified: Secondary | ICD-10-CM

## 2015-09-06 DIAGNOSIS — F3130 Bipolar disorder, current episode depressed, mild or moderate severity, unspecified: Secondary | ICD-10-CM

## 2015-09-06 DIAGNOSIS — E785 Hyperlipidemia, unspecified: Secondary | ICD-10-CM | POA: Diagnosis present

## 2015-09-06 DIAGNOSIS — N390 Urinary tract infection, site not specified: Secondary | ICD-10-CM | POA: Diagnosis present

## 2015-09-06 DIAGNOSIS — I10 Essential (primary) hypertension: Secondary | ICD-10-CM | POA: Diagnosis present

## 2015-09-06 DIAGNOSIS — G250 Essential tremor: Secondary | ICD-10-CM | POA: Diagnosis present

## 2015-09-06 DIAGNOSIS — Z79899 Other long term (current) drug therapy: Secondary | ICD-10-CM

## 2015-09-06 DIAGNOSIS — I679 Cerebrovascular disease, unspecified: Secondary | ICD-10-CM | POA: Diagnosis present

## 2015-09-06 DIAGNOSIS — G20 Parkinson's disease: Secondary | ICD-10-CM | POA: Diagnosis present

## 2015-09-06 DIAGNOSIS — G4700 Insomnia, unspecified: Secondary | ICD-10-CM | POA: Diagnosis present

## 2015-09-06 LAB — LIPID PANEL
Cholesterol: 163 mg/dL (ref 0–200)
HDL: 48 mg/dL (ref >40)
LDL Cholesterol: 103 mg/dL — ABNORMAL HIGH (ref 0–99)
Total CHOL/HDL Ratio: 3.4 RATIO
Triglycerides: 62 mg/dL (ref <150)
VLDL: 12 mg/dL (ref 0–40)

## 2015-09-06 LAB — TSH: TSH: 1.151 u[IU]/mL (ref 0.350–4.500)

## 2015-09-06 MED ORDER — ASPIRIN EC 81 MG PO TBEC
DELAYED_RELEASE_TABLET | ORAL | Status: AC
Start: 2015-09-06 — End: 2015-09-06
  Administered 2015-09-06: 81 mg
  Filled 2015-09-06: qty 1

## 2015-09-06 MED ORDER — ASPIRIN 81 MG PO CHEW
81.0000 mg | CHEWABLE_TABLET | Freq: Every day | ORAL | Status: DC
  Administered 2015-09-07 – 2015-09-20 (×14): 81 mg via ORAL
  Filled 2015-09-06 (×14): qty 1

## 2015-09-06 MED ORDER — ROPINIROLE HCL 0.25 MG PO TABS
0.2500 mg | ORAL_TABLET | Freq: Every day | ORAL | Status: DC
  Administered 2015-09-06 – 2015-09-19 (×14): 0.25 mg via ORAL
  Filled 2015-09-06 (×14): qty 1

## 2015-09-06 MED ORDER — GABAPENTIN 300 MG PO CAPS
600.0000 mg | ORAL_CAPSULE | Freq: Every day | ORAL | Status: DC
  Administered 2015-09-06: 600 mg via ORAL
  Filled 2015-09-06: qty 2

## 2015-09-06 MED ORDER — LITHIUM CARBONATE ER 300 MG PO TBCR
600.0000 mg | EXTENDED_RELEASE_TABLET | Freq: Once | ORAL | Status: AC
  Administered 2015-09-06: 600 mg via ORAL
  Filled 2015-09-06: qty 2

## 2015-09-06 MED ORDER — LITHIUM CARBONATE ER 300 MG PO TBCR: 600.0000 mg | EXTENDED_RELEASE_TABLET | Freq: Every day | ORAL | Status: DC

## 2015-09-06 MED ORDER — PRIMIDONE 50 MG PO TABS
50.0000 mg | ORAL_TABLET | Freq: Every day | ORAL | Status: DC
  Administered 2015-09-06 – 2015-09-19 (×14): 50 mg via ORAL
  Filled 2015-09-06 (×14): qty 1

## 2015-09-06 MED ORDER — LIDOCAINE 5 % EX PTCH
1.0000 | MEDICATED_PATCH | CUTANEOUS | Status: DC
  Administered 2015-09-07 – 2015-09-20 (×14): 1 via TRANSDERMAL
  Filled 2015-09-06 (×16): qty 1

## 2015-09-06 MED ORDER — OLANZAPINE 10 MG PO TABS
30.0000 mg | ORAL_TABLET | Freq: Every day | ORAL | Status: DC
  Administered 2015-09-06 – 2015-09-19 (×14): 30 mg via ORAL
  Filled 2015-09-06 (×14): qty 3

## 2015-09-06 MED ORDER — SIMVASTATIN 10 MG PO TABS
10.0000 mg | ORAL_TABLET | Freq: Every day | ORAL | Status: DC
  Administered 2015-09-06 – 2015-09-19 (×14): 10 mg via ORAL
  Filled 2015-09-06 (×14): qty 1

## 2015-09-06 MED ORDER — METFORMIN HCL 500 MG PO TABS
500.0000 mg | ORAL_TABLET | Freq: Two times a day (BID) | ORAL | Status: DC
  Administered 2015-09-06 – 2015-09-20 (×28): 500 mg via ORAL
  Filled 2015-09-06 (×28): qty 1

## 2015-09-06 MED ORDER — LORAZEPAM 0.5 MG PO TABS
0.5000 mg | ORAL_TABLET | Freq: Every day | ORAL | Status: DC
  Administered 2015-09-06 – 2015-09-07 (×2): 0.5 mg via ORAL
  Filled 2015-09-06 (×2): qty 1

## 2015-09-06 MED ORDER — LEVOFLOXACIN 500 MG PO TABS
500.0000 mg | ORAL_TABLET | Freq: Every day | ORAL | Status: DC
  Administered 2015-09-06 – 2015-09-20 (×15): 500 mg via ORAL
  Filled 2015-09-06 (×15): qty 1

## 2015-09-06 MED ORDER — LITHIUM CARBONATE 300 MG PO CAPS
300.0000 mg | ORAL_CAPSULE | Freq: Three times a day (TID) | ORAL | Status: DC
  Administered 2015-09-07 (×2): 300 mg via ORAL
  Filled 2015-09-06 (×2): qty 1

## 2015-09-06 MED ORDER — AMLODIPINE BESYLATE 10 MG PO TABS
10.0000 mg | ORAL_TABLET | Freq: Every day | ORAL | Status: DC
  Administered 2015-09-07 – 2015-09-20 (×13): 10 mg via ORAL
  Filled 2015-09-06 (×14): qty 1

## 2015-09-06 MED ORDER — ACETAMINOPHEN 325 MG PO TABS
650.0000 mg | ORAL_TABLET | Freq: Four times a day (QID) | ORAL | Status: DC | PRN
  Administered 2015-09-09 – 2015-09-20 (×4): 650 mg via ORAL
  Filled 2015-09-06 (×4): qty 2

## 2015-09-06 MED ORDER — MAGNESIUM HYDROXIDE 400 MG/5ML PO SUSP: 30.0000 mL | Freq: Every day | ORAL | Status: DC | PRN

## 2015-09-06 MED ORDER — PROPRANOLOL HCL ER 80 MG PO CP24
80.0000 mg | ORAL_CAPSULE | Freq: Every day | ORAL | Status: DC
  Administered 2015-09-07 – 2015-09-20 (×13): 80 mg via ORAL
  Filled 2015-09-06 (×15): qty 1

## 2015-09-06 MED ORDER — ALUM & MAG HYDROXIDE-SIMETH 200-200-20 MG/5ML PO SUSP: 30.0000 mL | ORAL | Status: DC | PRN

## 2015-09-06 NOTE — ED Notes (Signed)
Patient resting comfortably in room. No complaints or concerns voiced. No distress or abnormal behavior noted. Will continue to monitor with security cameras. Q 15 minute rounds continue. 

## 2015-09-06 NOTE — ED Notes (Signed)

## 2015-09-06 NOTE — Progress Notes (Signed)
69 yrs old male admitted with depression and feeling of hopelessness.He denies suicidal ideation.Cooperative with admission assessment.Skin assessment & contraband search done.No contraband found.

## 2015-09-06 NOTE — ED Notes (Signed)
ED BHU PLACEMENT JUSTIFICATION Is the patient under IVC or is there intent for IVC: Yes.   Is the patient medically cleared: Yes.   Is there vacancy in the ED BHU: Yes.   Is the population mix appropriate for patient: Yes.   Is the patient awaiting placement in inpatient or outpatient setting: Yes.   Has the patient had a psychiatric consult: Yes.   Survey of unit performed for contraband, proper placement and condition of furniture, tampering with fixtures in bathroom, shower, and each patient room: Yes.  ; Findings:  APPEARANCE/BEHAVIOR calm, cooperative and adequate rapport can be established NEURO ASSESSMENT Orientation: time, place and person Hallucinations: Yes.  None noted (Hallucinations) Speech: Normal Gait: normal RESPIRATORY ASSESSMENT Normal expansion.  Clear to auscultation.  No rales, rhonchi, or wheezing. CARDIOVASCULAR ASSESSMENT regular rate and rhythm, S1, S2 normal, no murmur, click, rub or gallop GASTROINTESTINAL ASSESSMENT soft, nontender, BS WNL, no r/g EXTREMITIES normal strength, tone, and muscle mass PLAN OF CARE Provide calm/safe environment. Vital signs assessed twice daily. ED BHU Assessment once each 12-hour shift. Collaborate with intake RN daily or as condition indicates. Assure the ED provider has rounded once each shift. Provide and encourage hygiene. Provide redirection as needed. Assess for escalating behavior; address immediately and inform ED provider.  Assess family dynamic and appropriateness for visitation as needed: Yes.  ; If necessary, describe findings:  Educate the patient/family about BHU procedures/visitation: Yes.  ; If necessary, describe findings:  

## 2015-09-06 NOTE — ED Notes (Signed)
Pt with co of feeling bad all over, unable to specify discomfort.  Calm and cooperative at this time, will continue to monitor.

## 2015-09-06 NOTE — ED Notes (Signed)
Meal given.  Patient in NAD. Continue all safety precautions.

## 2015-09-06 NOTE — ED Notes (Signed)

## 2015-09-06 NOTE — ED Notes (Signed)
Patient to be [facility] for inpatient treatment. Report called to floor.  All patient belongings sent with patient

## 2015-09-06 NOTE — ED Provider Notes (Signed)
-----------------------------------------   7:19 AM on 09/06/2015 -----------------------------------------   Blood pressure 155/87, pulse 61, temperature 97.1 F (36.2 C), temperature source Oral, resp. rate 14, SpO2 98 %.  The patient had no acute events since last update.  Calm and cooperative at this time.  Disposition is pending per Psychiatry/Behavioral Medicine team recommendations.     Irean HongJade J Sung, MD 09/06/15 [phone]
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
## 2015-09-06 NOTE — ED Notes (Signed)
Patient resting quietly in room. No noted distress or abnormal behaviors noted. Will continue 15 minute checks and observation by security camera for safety. 

## 2015-09-06 NOTE — ED Notes (Signed)
Patient cooperative with taking all morning medications. Mood remains depressed.  Continue with all safety precautions.

## 2015-09-06 NOTE — Plan of Care (Signed)
Problem: Ineffective individual coping Goal: STG: Patient will remain free from self harm Outcome: Progressing Denies suicidal ideation.     

## 2015-09-06 NOTE — Care Management Utilization Note (Signed)
   Per State Regulation 482.30  This chart was reviewed for necessity with respect to the patient's Admission/ Duration of stay.  Next review date: 09/10/15  Nicolasa Duckingrystal Morrison RN, BSN

## 2015-09-06 NOTE — ED Notes (Signed)

## 2015-09-06 NOTE — ED Notes (Signed)

## 2015-09-06 NOTE — [facility] Counselor (Signed)
Pt. is to be [facility] by Dr. Toni Amendlapacs. Attending Physician will be Dr. Jennet MaduroPucilowska.  Pt. has [facility] MaryhillPhyllis.  Intake Paper Work has been signed and placed on pt. chart. ER staff Valeda Malm(Luane, ER Sect.; Dr. Fanny BienQuale, ER MD; Amy H. Patient's Nurse & Christy Patient Access) have been made aware of the admission.

## 2015-09-06 NOTE — ED Notes (Addendum)
Patient quiet, cooperative. He states he needs to hospitalized for his depression. He is waiting to see the psychiatrist for an evaluation. Maintained on 15 minute checks and observation by security camera for safety.

## 2015-09-07 DIAGNOSIS — F3130 Bipolar disorder, current episode depressed, mild or moderate severity, unspecified: Secondary | ICD-10-CM

## 2015-09-07 LAB — GLUCOSE, CAPILLARY
GLUCOSE-CAPILLARY: 99 mg/dL (ref 65–99)
Glucose-Capillary: 96 mg/dL (ref 65–99)
Glucose-Capillary: 87 mg/dL (ref 65–99)
Glucose-Capillary: 79 mg/dL (ref 65–99)

## 2015-09-07 LAB — HEMOGLOBIN A1C: Hgb A1c MFr Bld: 5.3 % (ref 4.0–6.0)

## 2015-09-07 MED ORDER — GABAPENTIN 100 MG PO CAPS
400.0000 mg | ORAL_CAPSULE | Freq: Two times a day (BID) | ORAL | Status: DC
Start: 2015-09-07 — End: 2015-09-09
  Administered 2015-09-07 – 2015-09-09 (×4): 400 mg via ORAL
  Filled 2015-09-07 (×4): qty 1

## 2015-09-07 MED ORDER — LITHIUM CARBONATE ER 300 MG PO TBCR
600.0000 mg | EXTENDED_RELEASE_TABLET | Freq: Every day | ORAL | Status: DC
  Administered 2015-09-07 – 2015-09-08 (×2): 600 mg via ORAL
  Filled 2015-09-07 (×2): qty 2
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
## 2015-09-07 NOTE — Plan of Care (Signed)
Problem: Alteration in mood Goal: LTG-Pt's behavior demonstrates decreased signs of depression (Patient's behavior demonstrates decreased signs of depression to the point the patient is safe to return home and continue treatment in an outpatient setting)  Outcome: Not Progressing Presents with depressed affect.

## 2015-09-07 NOTE — [facility] Group Notes (Signed)
[facility] Group Notes:  (Nursing/MHT/Case Management/Adjunct)  Date:  09/07/2015  Time:  6:40 PM  Type of Therapy:  Group Therapy  Participation Level:  Did Not Attend  Participation Quality:  n/a  Affect:  n/a  Cognitive:  n/a  Insight:  None  Engagement in Group:  None  Modes of Intervention:  n/a  Summary of Progress/Problems:  Jocelyn LamerShakira Deona Ketty Bitton 09/07/2015, 6:40 PM

## 2015-09-07 NOTE — Evaluation (Signed)
Physical Therapy Evaluation Patient Details Name: Kyle [name] MRN: [id number] DOB: [birth year] Today's Date: 09/07/2015   History of Present Illness  Pt is a 69 y/o male that presents with major depressive episode and noted to have Bipolar Disroder. He reports he has had difficulty with ambulating recently, though he apparently was able to walk to EMS vehicle. He has had reports of SI in his chart. Noted to have peripheral neuropathy as well.   Clinical Impression  Patient demonstrates very flat affect and depressed disposition in this session ending with PT asking if he could provide anything for patient with a response of "a new life". Patient claims he cannot walk as well as he could when he was younger and states he has an achiness in his LEs and that his walking is poor. It appears that his mobility today is at his baseline recently, he does not show any loss of balance with ambulation without RW, though RW did allow for improved gait speed. Patient does demonstrate deficits in higher level balance, though it is difficult to assess if this is from poor effort or true deficit. Patient would benefit from continued balance work as he states he has fallen x 3 times in the past week.     Follow Up Recommendations  (HHPT vs. Outpatient based on transportation. [facility] may be appropriate)    Equipment Recommendations  Rolling walker with 5" wheels    Recommendations for Other Services       Precautions / Restrictions Precautions Precautions: Fall Restrictions Weight Bearing Restrictions: No      Mobility  Bed Mobility Overal bed mobility: Independent                Transfers Overall transfer level: Independent   Transfers: Sit to/from Stand           General transfer comment: Patient able to complete sit to stand with no cuing.   Ambulation/Gait Ambulation/Gait assistance: Supervision Ambulation Distance (Feet): 150 Feet (100' with RW) Assistive device:  Rolling walker (2 wheeled) Gait Pattern/deviations: WFL(Within Functional Limits)   Gait velocity interpretation: <1.8 ft/sec, indicative of risk for recurrent falls General Gait Details: Patient demonstrates decreased step length with and without RW. Unclear if this is due to weakness or lethargy.   Stairs            Wheelchair Mobility    Modified Rankin (Stroke Patients Only)       Balance Overall balance assessment: Needs assistance Sitting-balance support: Feet supported Sitting balance-Leahy Scale: Good Sitting balance - Comments: No balance deficits.    Standing balance support: No upper extremity supported Standing balance-Leahy Scale: Fair Standing balance comment: No loss of balance in standing with eyes open, mild loss of balance eyes closed. Unable to achieve tandem stance, SLS x 5 seconds bilaterally. Able to ambulate backwards with miminal step length and forwards with eyes closed and minimal drifting.                              Pertinent Vitals/Pain Pain Assessment:  (Reports some muscle "achiness" in his LEs)    Home Living Family/patient expects to be discharged to:: Private residence Living Arrangements: Alone Available Help at Discharge: Friend(s);Other (Comment) (His sister checks on him per chart?) Type of Home: Mobile home Home Access: Stairs to enter   Entrance Stairs-Number of Steps: 2 Home Layout: One level   Additional Comments: Patient did not provide any further information.  Prior Function                 Hand Dominance        Extremity/Trunk Assessment   Upper Extremity Assessment: Overall WFL for tasks assessed           Lower Extremity Assessment: Overall WFL for tasks assessed         Communication   Communication: No difficulties  Cognition Arousal/Alertness: Awake/alert Behavior During Therapy: Flat affect (Pt performs tasks asked for, poor affect throughout this session.) Overall  Cognitive Status: Difficult to assess                      General Comments General comments (skin integrity, edema, etc.): Modified DGI of 11/12, decreased gait speed     Exercises Other Exercises Other Exercises: Tandem stance x 3 repetitions with HHA from RW Other Exercises: Single leg stance x 5 repetitions with HHA from RW       Assessment/Plan    PT Assessment Patient needs continued PT services  PT Diagnosis Difficulty walking   PT Problem List Decreased activity tolerance;Decreased balance;Decreased strength  PT Treatment Interventions Gait training;DME instruction;Therapeutic activities;Therapeutic exercise;Balance training;Neuromuscular re-education   PT Goals (Current goals can be found in the Care Plan section) Acute Rehab PT Goals Patient Stated Goal: "A new life"  PT Goal Formulation: With patient Time For Goal Achievement: 09/21/15 Potential to Achieve Goals: Good Additional Goals Additional Goal #1:  (Pt will demonstrate low falls risk on balance outcome measur)    Frequency Min 2X/week   Barriers to discharge Decreased caregiver support His barriers seem more cognitive/behavioral than mobility safety.     Co-evaluation               End of Session Equipment Utilized During Treatment: Gait belt Activity Tolerance: Patient tolerated treatment well   Nurse Communication: Mobility status         Time: 1415-1436 PT Time Calculation (min) (ACUTE ONLY): 21 min   Charges:   PT Evaluation $Initial PT Evaluation Tier I: 1 Procedure     PT G Codes:       Kerin RansomPatrick A Reinette Cuneo, PT, DPT    09/07/2015, 4:31 PM

## 2015-09-07 NOTE — Progress Notes (Signed)
D: presents with sad flat affect.  Denies SI at this time but endorses SI.  Stays in his room to himself except for med pass and meals.  Reluctant to talk with staff and no interaction with peers when out of the room.  A:  Encouragement and support offered.  Encouraged group attendance.  Medication given per orders.  Safety maintained.  R:  No group attendance.  Medication compliant.

## 2015-09-07 NOTE — [facility] Suicide Risk Assessment (Signed)
[facility]   Nursing information obtained from:    Demographic factors:    Current Mental Status:    Loss Factors:    Historical Factors:    Risk Reduction Factors:    Total Time spent with patient: 1 hour Principal Problem: Bipolar I disorder depressed with melancholic features [facility](HCC) Diagnosis:   Patient Active Problem List   Diagnosis Date Noted  . Diabetes (HCC) [E11.9] 09/06/2015  . UTI (urinary tract infection) [N39.0] 09/06/2015  . RLS (restless legs syndrome) [G25.81] 08/08/2015  . Peripheral neuropathy (HCC) [G62.9] 08/08/2015  . Cerebrovascular disease [I67.9] 08/07/2015  . 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 Essential tremor [G25.0] 08/01/2015  . Hypertension [I10] 04/17/2015     Continued Clinical Symptoms:  Alcohol Use Disorder Identification Test Final Score (AUDIT): 0 The "Alcohol Use Disorders Identification Test", Guidelines for Use in Primary Care, Second Edition.  World Science [employer] [facility](WHO). Score between 0-7:  no or low risk or alcohol related problems. Score between 8-15:  moderate risk of alcohol related problems. Score between 16-19:  high risk of alcohol related problems. Score 20 or above:  warrants further diagnostic evaluation for alcohol dependence and treatment.   CLINICAL FACTORS:   Bipolar Disorder:   Depressive phase Previous Psychiatric Diagnoses and Treatments Medical Diagnoses and Treatments/Surgeries    Psychiatric Specialty Exam: Physical Exam  ROS    COGNITIVE FEATURES THAT CONTRIBUTE TO RISK:  None    SUICIDE RISK:   Moderate:  Frequent suicidal ideation with limited intensity, and duration, some specificity in terms of plans, no associated intent, good self-control, limited dysphoria/symptomatology, some risk factors present, and identifiable protective factors, including available and accessible social support.  [facility] C[facility]  Medical Decision Making:  Established Problem, Worsening (2)  I certify that inpatient services furnished can reasonably be expected to improve the patient's condition.   Jimmy FootmanHernandez-Gonzalez,  Carmeline Kowal 09/07/2015, 12:37 PM

## 2015-09-07 NOTE — [facility] Group Notes (Signed)
[facility] LCSW Group Therapy  09/07/2015 11:08 AM  Type of Therapy:  Group Therapy  Participation Level:  Did Not Attend  Modes of Intervention:  Discussion, Education, Socialization and Support  Summary of Progress/Problems: Balance in life: Patients will discuss the concept of balance and how it looks and feels to be unbalanced. Pt will identify areas in their life that is unbalanced and ways to become more balanced.    Kendalynn Wideman L Gillian Meeuwsen MSW, LCSWA  09/07/2015, 11:08 AM  

## 2015-09-08 LAB — GLUCOSE, CAPILLARY
GLUCOSE-CAPILLARY: 71 mg/dL (ref 65–99)
Glucose-Capillary: 75 mg/dL (ref 65–99)
Glucose-Capillary: 80 mg/dL (ref 65–99)
Glucose-Capillary: 89 mg/dL (ref 65–99)

## 2015-09-08 MED ORDER — LORAZEPAM 1 MG PO TABS
1.0000 mg | ORAL_TABLET | Freq: Every day | ORAL | Status: DC
  Administered 2015-09-08 – 2015-09-19 (×12): 1 mg via ORAL
  Filled 2015-09-08 (×12): qty 1

## 2015-09-08 NOTE — Progress Notes (Signed)
Client has c/o feeling weak and has stayed in his room; has accepted medications; CBG: 79; denies s.i./h.i.; will continue to monitor.
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
## 2015-09-08 NOTE — [facility] Group Notes (Signed)
[facility] LCSW Group Therapy  09/08/2015 11:44 AM  Type of Therapy:  Group Therapy  Participation Level:  Did Not Attend   Modes of Intervention:  Discussion, Education, Socialization and Support  Summary of Progress/Problems: Pt will identify unhealthy thoughts and how they impact their emotions and behavior. Pt will be encouraged to discuss these thoughts, emotions and behaviors with the group.   Shanece Cochrane L Hurshell Dino MSW, LCSWA  09/08/2015, 11:44 AM

## 2015-09-08 NOTE — Progress Notes (Signed)
D: Patient has been in bed all day except to get up for meals. States his depression is improving. Denies SI. Affect is flat. A: On q 15 minute checks. Given meds. Offered emotional support. R: Isolative. Mood improving.

## 2015-09-09 LAB — GLUCOSE, CAPILLARY
GLUCOSE-CAPILLARY: 74 mg/dL (ref 65–99)
GLUCOSE-CAPILLARY: 77 mg/dL (ref 65–99)
GLUCOSE-CAPILLARY: 90 mg/dL (ref 65–99)
GLUCOSE-CAPILLARY: 94 mg/dL (ref 65–99)

## 2015-09-09 MED ORDER — GABAPENTIN 300 MG PO CAPS
400.0000 mg | ORAL_CAPSULE | Freq: Three times a day (TID) | ORAL | Status: DC
  Administered 2015-09-09 – 2015-09-20 (×33): 400 mg via ORAL
  Filled 2015-09-09 (×33): qty 1

## 2015-09-09 MED ORDER — LITHIUM CARBONATE ER 300 MG PO TBCR
300.0000 mg | EXTENDED_RELEASE_TABLET | Freq: Two times a day (BID) | ORAL | Status: DC
  Administered 2015-09-09 – 2015-09-10 (×4): 300 mg via ORAL
  Filled 2015-09-09 (×5): qty 1

## 2015-09-09 NOTE — [facility] Group Notes (Signed)
[facility]   09/09/2015 4:21 PM  Participation Quality:  Did not attend group   Lulu RidingIngle, Mateus Rewerts T, MSW, LCSWA

## 2015-09-09 NOTE — Plan of Care (Signed)
Problem: Ineffective individual coping Goal: STG-Increase in ability to manage activities of daily living Outcome: Not Progressing Patient stated that he does not want to get up and move around that he feels unsteady to his feet even with the walker.  Patient has been up but only with staff.

## 2015-09-09 NOTE — Progress Notes (Addendum)
D: Patient denies SI/HI/AVH. Patient affect and mood are depressed and anxious.  Patient did NOT attend evening group. Patient remained in his room throughout the shift.  No distress noted. A: Support and encouragement offered. Scheduled medications given to pt. Q 15 min checks continued for patient safety. R: Patient receptive. Patient remains safe on the unit.

## 2015-09-09 NOTE — [facility] Group Notes (Signed)
[facility] Group Notes:  (Nursing/MHT/Case Management/Adjunct)  Date:  09/09/2015  Time:  2:14 PM  Type of Therapy:  Psychoeducational Skills  Participation Level:  Did Not Attend   Summary of Progress/Problems:  Darrow BussingMarly S Jathan Balling 09/09/2015, 2:14 PM

## 2015-09-09 NOTE — Plan of Care (Signed)
Problem: Ineffective individual coping Goal: STG:Pt. will utilize relaxation techniques to reduce stress STG: Patient will utilize relaxation techniques to reduce stress levels  Outcome: Progressing Pt able to identify some relaxations techniques to use for stress.

## 2015-09-09 NOTE — Progress Notes (Signed)
Recreation Therapy Notes  Date: 10.17.16 Time: 3:00 pm Location: Craft Room  Group Topic: Self-expression  Goal Area(s) Addresses:  Patient will be able to identify a color that represents each emotion. Patient will verbalize benefit of using art as a means of self-expression. Patient will verbalize one positive emotion experienced while participating in activity.  Behavioral Response: Did not attend  Intervention: The Colors Within Me  Activity: Patients were given blank face worksheets and instructed to pick a color for each emotion they were experiencing and show how much of the emotion they were experiencing on the face.  Education: LRT educated patients on different forms of self-expression.  Education Outcome: Patient did not attend group.   Clinical Observations/Feedback: Patient did not attend group.  Jacquelynn CreeGreene,Peterson Mathey M, LRT/CTRS 09/09/2015 4:22 PM
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
## 2015-09-09 NOTE — Progress Notes (Signed)
D: patient observed in room for most of the shift.  Patient had little interaction with anyone this shift.  Patient denies any SI or HI at this time.  Patient states that he is still depressed and has little energy to do anything and feel weak.  Patients affect is flat.  Compliant with medications.  Patient in no distress at this time. A: support and encouragement provided.  Encouraged patient to get up and move around.  Medications given as prescribed and Q 15 min checks done  R: patient receptive of information and care given

## 2015-09-09 NOTE — Progress Notes (Signed)
Recreation Therapy Notes  At approximately 2:15 pm, LRT attempted assessment. Patient refused.  Jacquelynn CreeGreene,Jozelynn Danielson M, LRT/CTRS 09/09/2015 5:08 PM

## 2015-09-10 LAB — GLUCOSE, CAPILLARY
GLUCOSE-CAPILLARY: 62 mg/dL — AB (ref 65–99)
Glucose-Capillary: 85 mg/dL (ref 65–99)

## 2015-09-10 NOTE — Progress Notes (Signed)
He was hypoactive and stayed in room most of the time.Did not attend groups.States " no groups today."Verbelized depression and passive suicidal ideation at times.Uses walker for ambulation.
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
## 2015-09-10 NOTE — [facility] Group Notes (Signed)
[facility] LCSW Group Therapy  09/10/2015 3:02 PM  Type of Therapy:  Group Therapy  Participation Level:  Did Not Attend   Serria Sloma T, MSW, LCSWA 09/10/2015, 3:02 PM  

## 2015-09-10 NOTE — Progress Notes (Signed)
Lab called stating that they mistakenly removed the previous Lithium Level order.  Writer entered new order for morning lab draws.

## 2015-09-10 NOTE — Progress Notes (Signed)
Inpatient Diabetes Program Recommendations  AACE/ADA: New Consensus Statement on Inpatient Glycemic Control (2015)  Target Ranges:  Prepandial:   less than 140 mg/dL      Peak postprandial:   less than 180 mg/dL (1-2 hours)      Critically ill patients:  140 - 180 mg/dL  Results for Suezanne JacquetHAMLETT, Harshith D (MRN [id number]) as of 09/10/2015 11:05  Ref. Range 09/09/2015 06:46 09/09/2015 12:19 09/09/2015 17:08 09/09/2015 21:06 09/10/2015 06:35  Glucose-Capillary Latest Ref Range: 65-99 mg/dL 74 77 90 94 62 (L)   Review of Glycemic Control  Diabetes history: DM2 Outpatient Diabetes medications: Metformin 500 mg BID Current orders for Inpatient glycemic control: Metformin 500 mg BID  Inpatient Diabetes Program Recommendations: Oral Agents: Noted fasting glucose 62 mg/dl this morning. May want to consider discontinuing Metformin at this time. If so, recommend continuing to check CBGs ACHS.  Thanks, Orlando PennerMarie Woodroe Vogan, RN, MSN, CCRN, CDE Diabetes Coordinator Inpatient Diabetes Program [phone] (Team Pager from 8am to 5pm) [phone] ([facility] office) [phone] [facility] office) [phone] [facility] office)

## 2015-09-10 NOTE — Progress Notes (Signed)
Recreation Therapy Notes  Date: 10.18.16 Time: 3:00 pm Location: Craft Room  Group Topic: Goal Setting  Goal Area(s) Addresses:  Patient will write one goal. Patient will verbalize benefit of setting goals.  Behavioral Response: Did not attend  Intervention: Step By Step  Activity: Patients were given a worksheet with a foot on it. Patients were instructed to write a goal on the inside of the foot and write positive words on the outside of the foot.  Education: LRT educated patients on ways the can keep themselves focused on their goals.  Education Outcome: Patient did not attend group.   Clinical Observations/Feedback: Patient did not attend group.  Lujean Ebright M, LRT/CTRS 09/10/2015 4:02 PM 

## 2015-09-10 NOTE — Tx Team (Signed)
Interdisciplinary Treatment Plan Update (Adult)  Date:  09/10/2015 Time Reviewed:  5:04 PM  Progress in Treatment: Attending groups: No. Participating in groups:  No. Taking medication as prescribed:  Yes. Tolerating medication:  Yes. Family/Significant othe contact made:  No, will contact:  when given permission Patient understands diagnosis:  Yes. Discussing patient identified problems/goals with staff:  Yes. Medical problems stabilized or resolved:  Yes. Denies suicidal/homicidal ideation: No. Issues/concerns per patient self-inventory:  Yes. Other:  New problem(s) identified: No  Discharge Plan or Barriers: Pt requesting  Discharge to ALF, although he does not have proper funding for this.  Pt will likely return home with referral to psych home health.  Reason for Continuation of Hospitalization: Depression Suicidal ideation  Comments: [name] reports no improvement. He still very depressed, despondent, suicidal. He hardly get out of bed. He has no energy. He is not able to participate in discharge planning. He was restarted on his medications and tolerates them well. He reports adequate sleep and appetite. There is marginal participation in programming.  Estimated length of stay:up to 5 days  New goal(s):  Review of initial/current patient goals per problem list:   See plan of care  Attendees: Patient:  Kyle [name] 10/18/20165:04 PM  Family:   10/18/20165:04 PM  Physician:  Kristine LineaJolanta Pucilowska 10/18/20165:04 PM  Nursing:   Hulan AmatoGwen Farrish, RN 10/18/20165:04 PM  Case Manager:   10/18/20165:04 PM  Counselor:  Jake SharkSara Gaylin Bulthuis, LCSW 10/18/20165:04 PM  Other:  Hershal CoriaBeth Greene, LRT 10/18/20165:04 PM  Other:  Beryl MeagerJason Ingle, LCSWA 10/18/20165:04 PM  Other:   10/18/20165:04 PM  Other:  10/18/20165:04 PM  Other:  10/18/20165:04 PM  Other:  10/18/20165:04 PM  Other:  10/18/20165:04 PM  Other:  10/18/20165:04 PM  Other:  10/18/20165:04 PM  Other:   10/18/20165:04 PM   Scribe  for Treatment Team:   Glennon MacLaws, Arianne Klinge P, 09/10/2015, 5:04 PM, MSW, LCSW

## 2015-09-10 NOTE — Progress Notes (Signed)
Pt spent all evening in his room, pt denies any SI/HI/VAH, reports of chronic generalized pain, PRN given to pt for this complain, no further concerns voiced, safety maintained.

## 2015-09-10 NOTE — Plan of Care (Signed)
Problem: Alteration in mood Goal: LTG-Pt's behavior demonstrates decreased signs of depression (Patient's behavior demonstrates decreased signs of depression to the point the patient is safe to return home and continue treatment in an outpatient setting)  Outcome: Not Progressing Verbalized depression and passive suicidal ideation.

## 2015-09-10 NOTE — [facility] Group Notes (Signed)
[facility] Group Notes:  (Nursing/MHT/Case Management/Adjunct)  Date:  09/10/2015  Time:  2:06 PM  Type of Therapy:  Psychoeducational Skills  Participation Level:  Did Not Attend    [name] 09/10/2015, 2:06 PM

## 2015-09-11 LAB — LITHIUM LEVEL: LITHIUM LVL: 0.46 mmol/L — AB (ref 0.60–1.20)

## 2015-09-11 LAB — GLUCOSE, CAPILLARY: Glucose-Capillary: 82 mg/dL (ref 65–99)

## 2015-09-11 MED ORDER — LITHIUM CARBONATE ER 300 MG PO TBCR
300.0000 mg | EXTENDED_RELEASE_TABLET | Freq: Three times a day (TID) | ORAL | Status: DC
  Administered 2015-09-11 – 2015-09-20 (×28): 300 mg via ORAL
  Filled 2015-09-11 (×28): qty 1
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
## 2015-09-11 NOTE — Progress Notes (Signed)
Pt spent most evening in his room, he neither attended groups nor socialized, presents in a depressed mood and flat affect but denied being suicidal. Pt compliant with meds and meals, will continue to monitor for safety.

## 2015-09-11 NOTE — Progress Notes (Signed)
PT Cancellation Note  Patient Details Name: Kyle JacquetMarvin D Warchol MRN: [id number] DOB: [birth year]   Cancelled Treatment:    Reason Eval/Treat Not Completed: Patient declined, no reason specified. Arrived to find pt asleep in bed. Pt easily awakened, but does not maintain eyes open for prolonged periods. Pt speaking with a very low level volume, mostly mumbling and difficult to understand. Pt not making meaningful gestures to any attempts to engage in treatment or physical activity. This is a significant change in disposition from previous session with PT. Will attempt at later date/time.      Kindle Strohmeier C 09/11/2015, 12:11 PM  12:15 PM  Rosamaria LintsAllan C Jeziel Hoffmann, PT, DPT [place] License # [id number]

## 2015-09-11 NOTE — BHH Group Notes (Signed)
BHH Group Notes:  (Nursing/MHT/Case Management/Adjunct)  Date:  09/11/2015  Time:  2:53 PM  Type of Therapy:  Psychoeducational Skills  Participation Level:  Did Not Attend   [name] Madoni 09/11/2015, 2:53 PM 

## 2015-09-11 NOTE — Plan of Care (Signed)
Problem: Ineffective individual coping Goal: LTG: Patient will report a decrease in negative feelings Outcome: Progressing Pt able to report decrease in negative feelings and identified coping skills to use when needed.

## 2015-09-11 NOTE — [facility] Group Notes (Signed)
[facility]   09/11/2015 1:18 PM  Patient did not attend.  Jenel LucksJasmine Lewis, Clinical Social Work Intern 09/11/15  Beryl MeagerJason Sumiko Ceasar, MSW, Theresia MajorsLCSWA 09/12/15

## 2015-09-11 NOTE — Progress Notes (Signed)
Recreation Therapy Notes  Date: 10.19.16 Time: 3:00 pm Location: Craft Room  Group Topic: Self-esteem  Goal Area(s) Addresses:  Patient will identify positive attributes about self. Patient will identify at least one healthy coping skill.  Behavioral Response: Did not attend  Intervention: All About Me  Activity: Patients were instructed to make an All About Me pamphlet listing their life's motto, positive traits, healthy coping skills, and their healthy support system.  Education: LRT educated patient on ways they can increase their self-esteem   Education Outcome: Patient did not attend group.   Clinical Observations/Feedback: Patient did not attend group.  Alrick Cubbage M, LRT/CTRS 09/11/2015 4:26 PM 

## 2015-09-11 NOTE — Plan of Care (Signed)
Problem: Consults Goal: BHH General Treatment Patient Education Outcome: Progressing No SI/HI at this time.      
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
## 2015-09-11 NOTE — Progress Notes (Signed)
Patient with depressed affect, withdrawn behavior. Sleepy in bed this am. Quiet speech, poor eye contact when nurse initiates interaction. No SI/HI at this time. Fair adls, good appetite. Ambulates with slow and steady gait with walker, safety maintained.

## 2015-09-12 NOTE — Plan of Care (Signed)
Problem: Ineffective individual coping Goal: LTG: Patient will report a decrease in negative feelings Outcome: Not Progressing Pt stated he felt down, bad today Goal: STG: Patient will remain free from self harm Outcome: Progressing Pt denied SI at this time

## 2015-09-12 NOTE — Plan of Care (Signed)
Problem: Ineffective individual coping Goal: STG: Patient will remain free from self harm Outcome: Progressing No self harm reported or observed     

## 2015-09-12 NOTE — BHH Group Notes (Signed)
[facility] Group Notes:  (Nursing/MHT/Case Management/Adjunct)  Date:  09/12/2015  Time:  3:51 PM  Type of Therapy:  Psychoeducational Skills  Participation Level:  Did Not Attend   Lynelle SmokeCara Travis [facility] CenterMadoni 09/12/2015, 3:51 PM
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
## 2015-09-12 NOTE — Progress Notes (Signed)
D: Pt denies SI/HI/AVH. Pt is pleasant and cooperative. Pt stated he was having a rough day, but did not have any specific reason for feeling down. Pt observed in room most of the evening, pt encouraged to get out and sit in the dayroom and try to interact with peers .   A: Pt was offered support and encouragement. Pt was given scheduled medications. Pt was encourage to attend groups. Q 15 minute checks were done for safety.   R:Pt attends groups and interacts well with peers and staff. Pt is taking medication. Pt has no complaints at this time .Pt receptive to treatment and safety maintained on unit.

## 2015-09-12 NOTE — [facility] Group Notes (Signed)
[facility] LCSW Group Therapy  09/12/2015 1:37 PM   Type of Therapy:  Group Therapy  Participation Level:  Did Not Attend  Modes of Intervention:  Discussion, Education, Socialization and Support  Summary of Progress/Problems: Balance in life: Patients will discuss the concept of balance and how it looks and feels to be unbalanced. Pt will identify areas in their life that is unbalanced and ways to become more balanced.   Corena Tilson L Virgina Deakins MSW, LCSWA  09/12/2015, 1:37 PM  

## 2015-09-12 NOTE — Progress Notes (Signed)
Recreation Therapy Notes  Date: 10.20.16 Time: 3:00 pm Location: Craft Room  Group Topic: Leisure Education  Goal Area(s) Addresses:  Patient will write at least one leisure activity. Patient will write a leisure goal.  Behavioral Response: Did not attend   Intervention: Leisure Bucket List  Activity: Patients were given a leisure bucket list worksheet and instructed to list different leisure activity they had not tried, but wanted to.  Education: LRT educated patients on what is needed to participate in leisure.  Education Outcome: Patient did not attend group.  Clinical Observations/Feedback: Patient did not attend group.  Equilla Que M, LRT/CTRS 09/12/2015 4:14 PM 

## 2015-09-12 NOTE — Progress Notes (Signed)
Pt isolates to self and room. No interaction with staff or peers. Stays in bed all of shift. Does not get up for meals or group. Encouraged patient to attend group and to go to meals. Pt refused. Educated pt on the importance of attending group and eating. Pt verbalized understanding and continues to lay in bed. Will continue to assess and monitor for safety.

## 2015-09-12 NOTE — Progress Notes (Signed)
Pt remains sad and depressed. Flat affect.  Isolative and guarded. Med compliant. Refused bedtime snacks. No voiced thoughts of hurting himself. Denies AV/H noted. Will continue to monitor for safety and behavior.

## 2015-09-13 LAB — GLUCOSE, CAPILLARY
GLUCOSE-CAPILLARY: 61 mg/dL — AB (ref 65–99)
Glucose-Capillary: 75 mg/dL (ref 65–99)
Glucose-Capillary: 100 mg/dL — ABNORMAL HIGH (ref 65–99)

## 2015-09-13 MED ORDER — VENLAFAXINE HCL ER 75 MG PO CP24
75.0000 mg | ORAL_CAPSULE | Freq: Every day | ORAL | Status: DC
Start: 2015-09-14 — End: 2015-09-19
  Administered 2015-09-14 – 2015-09-19 (×6): 75 mg via ORAL
  Filled 2015-09-13 (×6): qty 1

## 2015-09-13 NOTE — [facility] Group Notes (Signed)
[facility] Group Notes:  (Nursing/MHT/Case Management/Adjunct)  Date:  09/13/2015  Time:  2:06 PM  Type of Therapy:  Psychoeducational Skills  Participation Level:  Did Not Attend   Lynelle SmokeCara Travis [facility] Hospital[facility] 09/13/2015, 2:06 PM

## 2015-09-13 NOTE — Progress Notes (Signed)
Inpatient Diabetes Program Recommendations  AACE/ADA: New Consensus Statement on Inpatient Glycemic Control (2015)  Target Ranges:  Prepandial:   less than 140 mg/dL      Peak postprandial:   less than 180 mg/dL (1-2 hours)      Critically ill patients:  140 - 180 mg/dL   Review of Glycemic Control  Diabetes history: DM2 Outpatient Diabetes medications: Metformin 500 mg BID Current orders for Inpatient glycemic control: Metformin 500 mg BID  Inpatient Diabetes Program Recommendations: Oral Agents: Noted finger stick blood sugar 61mg /dl this am- may want to consider discontinuing Metformin at this time. Recommend checking CBGs tid and hs.  Susette RacerJulie Rika Daughdrill, RN, BA, MHA, CDE Diabetes Coordinator Inpatient Diabetes Program  [phone] (Team Pager) [phone] [facility] Office) 09/13/2015 8:19 AM

## 2015-09-13 NOTE — Progress Notes (Signed)
09/13/15 1106  PT Visit Information  Last PT Received On 09/13/15  Assistance Needed +1  History of Present Illness Pt is a 69 y/o male that presents with major depressive episode and noted to have Bipolar Disroder. He reports he has had difficulty with ambulating recently, though he apparently was able to walk to EMS vehicle. He has had reports of SI in his chart. Noted to have peripheral neuropathy as well.   PT Time Calculation  PT Start Time (ACUTE ONLY) 1545  PT Stop Time (ACUTE ONLY) 1610  PT Time Calculation (min) (ACUTE ONLY) 25 min  Subjective Data  Subjective "I guess we could do something"  Patient Stated Goal "A new life"   Precautions  Precautions Fall  Restrictions  Weight Bearing Restrictions No  Pain Assessment  Pain Assessment (Pt reports chronic back pain but does not rate)  Cognition  Arousal/Alertness Awake/alert  Behavior During Therapy Flat affect  Overall Cognitive Status Difficult to assess  Bed Mobility  Overal bed mobility Independent  Transfers  Overall transfer level Independent  Equipment used None  Transfers Sit to/from Stand  Sit to Stand Independent  General transfer comment Good speed and sequencing. Does not need UE support  Ambulation/Gait  Ambulation/Gait assistance Min guard  Ambulation Distance (Feet) 300 Feet  Assistive device Rolling walker (2 wheeled) (Progressing to no assistive device)  Gait Pattern/deviations Decreased step length - right;Decreased step length - left  General Gait Details Performed ambulation with patient progressing from rolling walker ot no assistive device. Performed horizontal and vertical head turning with patient without lateral gait deviations noted. Pt able to demonstrate appreciable change in gait speed. Modified DGI: 12/12  Gait velocity de  Gait velocity interpretation <1.8 ft/sec, indicative of risk for recurrent falls  Balance  Overall balance assessment Needs assistance  Sitting balance-Leahy Scale  Good  Standing balance-Leahy Scale Good  Single Leg Stance - Right Leg 4  Single Leg Stance - Left Leg 4  Rhomberg - Eyes Opened 30  Rhomberg - Eyes Closed 30  High Level Balance Comments Performed bouts of Rhomberg and modified tandem balance with eyes open/closed as well as horizontal and vertical head turns. Alternated LE forward. Completed BERG with patient  Standardized Balance Assessment  Standardized Balance Assessment  Berg Balance Test  Berg Balance Test  Sit to Stand 4  Standing Unsupported 4  Sitting with Back Unsupported but Feet Supported on Floor or Stool 4  Stand to Sit 4  Transfers 4  Standing Unsupported with Eyes Closed 4  Standing Ubsupported with Feet Together 4  From Standing, Reach Forward with Outstretched Arm 4  From Standing Position, Pick up Object from Floor 4  From Standing Position, Turn to Look Behind Over each Shoulder 4  Turn 360 Degrees 2  Standing Unsupported, Alternately Place Feet on Step/Stool 4  Standing Unsupported, One Foot in Front 3  Standing on One Leg 2  Total Score 51  PT - End of Session  Equipment Utilized During Treatment Gait belt  Activity Tolerance Patient tolerated treatment well  Patient left in bed  Nurse Communication Mobility status  PT - Assessment/Plan  PT Plan Discharge plan needs to be updated;Frequency needs to be updated  Follow Up Recommendations Outpatient PT  PT equipment Rolling walker with 5" wheels  PT Goal Progression  Progress towards PT goals Goals met and updated - see care plan  Acute Rehab PT Goals  PT Goal Formulation With patient  Time For Goal Achievement 09/21/15  Potential to Achieve Goals Good  PT General Charges  $$ ACUTE PT VISIT 1 Procedure  PT Treatments  $Neuromuscular Re-education 23-37 mins   Pt has no further need for PT services at this time while admitted and order will be completed. Marland Kitchen He demonstrate appropriate and safe balance to return home. Pt does have some higher level balance  deficits which are likely chronic and unrelated to this admission. Pt would benefit from OP PT if he is willing to participate for higher level balance program. Overall pt is mostly self-limiting.   This note is a late entry note for visit which occurred at 1545 on 09/13/15.  Lyndel Safe Huprich PT, DPT   11:15 AM 09/13/2015
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
## 2015-09-13 NOTE — Progress Notes (Signed)
Patient ID: Kyle [name], male   DOB: [birth year], 69 y.o.   MRN: [id number]   CSW attempted to make a referral to [facility] for a care coordinator. However, they would not accept the referral due to patient only having Medicare.   Daisy FloroCandace L Caasi Giglia MSW, LCSWA  09/13/2015 12:51 PM

## 2015-09-13 NOTE — Progress Notes (Signed)
NUTRITION NOTE:   Kyle [name] is a 69 y.o. 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 Bipolar 1 disorder (HCC)   . HTN   . Cerebrovascular disease   . Neuropathy (HCC)     Diet Order:  2 gm Na  Current Nutrition: eating on average 79% of meals  Anthropometrics:  Body mass index is 31.03 kg/(m^2).   Pt is at no nutrition risk due to diagnosis/current problem of bipolar, severe depression, 2 gm Na diet order, 79% po intake. No nutrition intervention warranted at this time. Will sign off. Please re-consult RD if nutritional issues arise.   Misael Mcgaha B. Freida BusmanAllen, RD, LDN [phone] (pager)

## 2015-09-13 NOTE — Progress Notes (Signed)
Recreation Therapy Notes  Date: 10.21.16 Time: 3:00 pm Location: Craft Room  Group Topic: Coping Skills  Goal Area(s) Addresses:  Patient will participate in coloring activity. Patient will verbalize benefit of art as a coping skills  Behavioral Response: Did not attend  Intervention: Coloring  Activity: Patients were given different coloring sheets and instructed to think about what emotions they were feeling and what they were focused on while they were coloring.  Education: LRT educated patients on healthy coping skills and why they are important.  Education Outcome: Patient did not attend group.  Clinical Observations/Feedback: Patient did not attend group.  Kyle [name] M, LRT/CTRS 09/13/2015 4:15 PM 

## 2015-09-13 NOTE — Progress Notes (Signed)
Continues to present with sad flat affect.  Verbalized that he is feeling better and rates depression as a 2.  Continues to isolate to his room.  No interaction noted with peers.  Safety maintained.

## 2015-09-13 NOTE — [facility] Group Notes (Signed)
[facility] Group Notes:  (Nursing/MHT/Case Management/Adjunct)  Date:  09/13/2015  Time:  5:54 AM  Type of Therapy:  Group Therapy  Participation Level:  Did Not Attend    Kyle [name] [name] 09/13/2015, 5:54 AM

## 2015-09-13 NOTE — Plan of Care (Signed)
Problem: Alteration in mood Goal: LTG-Patient reports reduction in suicidal thoughts (Patient reports reduction in suicidal thoughts and is able to verbalize a safety plan for whenever patient is feeling suicidal)  Outcome: Progressing Denies SI     

## 2015-09-13 NOTE — Progress Notes (Signed)
Hypoglycemic Event  CBG: 61  Treatment: 15 GM carbohydrate snack  Symptoms: None  Follow-up CBG: Time:0719 CBG Result:75  Possible Reasons for Event: Inadequate meal intake  Comments/MD notified: Called Dr , no call back.    Kyle [name], Kyle [name]

## 2015-09-13 NOTE — [facility] Group Notes (Signed)
[facility] LCSW Group Therapy  09/13/2015 3:18 PM  Patient did not attend group.  Jenel LucksJasmine Lewis, Clinical Social Work Intern 09/13/2015, 3:18 PM

## 2015-09-14 LAB — GLUCOSE, CAPILLARY
GLUCOSE-CAPILLARY: 97 mg/dL (ref 65–99)
GLUCOSE-CAPILLARY: 77 mg/dL (ref 65–99)
GLUCOSE-CAPILLARY: 106 mg/dL — AB (ref 65–99)
Glucose-Capillary: 72 mg/dL (ref 65–99)
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
## 2015-09-14 NOTE — [facility] Group Notes (Signed)
[facility] LCSW Group Therapy  09/14/2015 2:16 PM  Type of Therapy:  Group Therapy  Participation Level:  Did Not Attend  Modes of Intervention:  Discussion, Education, Socialization and Support  Summary of Progress/Problems: Pt will identify unhealthy thoughts and how they impact their emotions and behavior. Pt will be encouraged to discuss these thoughts, emotions and behaviors with the group.   Jabriel Vanduyne L Shadavia Dampier MSW, LCSWA  09/14/2015, 2:16 PM 

## 2015-09-14 NOTE — [facility] Group Notes (Signed)
[facility] Group Notes:  (Nursing/MHT/Case Management/Adjunct)  Date:  09/14/2015  Time:  9:03 AM  Type of Therapy:  Goals   Participation Level:  Did Not Attend  Kyle [name] 09/14/2015, 9:03 AM

## 2015-09-14 NOTE — Plan of Care (Signed)
Problem: Ineffective individual coping Goal: LTG: Patient will report a decrease in negative feelings Outcome: Progressing States his mood is improving.

## 2015-09-14 NOTE — Progress Notes (Signed)
D: Patient using walker to help ambulate to dayroom for meals. He said he does not go to groups because he doesn't like to hear others arguing and his voice is soft, so people have to ask him to repeat himself. Rates depression 2/10. Denies SI. Affect is flat. States he wants to get better so he can "get out of here." A: On 15 minute checks for safety. Given medications. Offered emotional support. Encouraged interaction in milieu. R: Isolative. Guarded.

## 2015-09-14 NOTE — Plan of Care (Signed)
Problem: Alteration in mood Goal: LTG-Patient reports reduction in suicidal thoughts (Patient reports reduction in suicidal thoughts and is able to verbalize a safety plan for whenever patient is feeling suicidal)  Outcome: Progressing Patient denies SI/HI.      

## 2015-09-14 NOTE — Progress Notes (Signed)
D: Pt denies SI/HI/AVH. Pt is pleasant and cooperative. Patient affect is flat and sad but brightens upon approach, no distress noted, he appears less anxious and he is interacting with peers and staff appropriately.  A: Pt was offered support and encouragement. Pt was given scheduled medications. Pt was encouraged to attend groups. Q 15 minute checks were done for safety.  R:Pt did not attend evening group.  Pt is compliant with medication. Pt has no complaints.Pt receptive to treatment and safety maintained on unit.

## 2015-09-15 LAB — GLUCOSE, CAPILLARY
GLUCOSE-CAPILLARY: 80 mg/dL (ref 65–99)
GLUCOSE-CAPILLARY: 84 mg/dL (ref 65–99)
Glucose-Capillary: 94 mg/dL (ref 65–99)
Glucose-Capillary: 91 mg/dL (ref 65–99)

## 2015-09-15 NOTE — [facility] Group Notes (Signed)
[facility] Group Notes:  (Nursing/MHT/Case Management/Adjunct)  Date:  09/15/2015  Time:  8:56 AM  Type of Therapy:  Goals   Participation Level:  Did Not Attend  Kyle [name] De'Chelle Kimi Kroft 09/15/2015, 8:56 AM

## 2015-09-15 NOTE — [facility] Group Notes (Signed)
[facility] LCSW Group Therapy  09/15/2015 2:22 PM  Type of Therapy:  Group Therapy  Participation Level:  Did Not Attend  Modes of Intervention:  Discussion, Education, Socialization and Support  Summary of Progress/Problems: Todays topic: Grudges  Patients will be encouraged to discuss their thoughts, feelings, and behaviors as to why one holds on to grudges and reasons why people have grudges. Patients will process the impact of grudges on their daily lives and identify thoughts and feelings related to holding grudges. Patients will identify feelings and thoughts related to what life would look like without grudges.   Jonh Mcqueary L Makayleigh Poliquin MSW, LCSWA  09/15/2015, 2:22 PM 

## 2015-09-15 NOTE — Progress Notes (Signed)
   D: After the introduction writer asked the pt if he had any questions or concerns. Pt stated, "they give me too many pills. Every night I get about 15 or 16, it's just too many". 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 However, Clinical research associatewriter informed pt that she would make his concerns known.   A:  Support and encouragement was offered. 15 min checks continued for safety.  R: Pt remains safe.
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
## 2015-09-15 NOTE — Plan of Care (Signed)
Problem: Ineffective individual coping Goal: STG: Patient will remain free from self harm Outcome: Progressing Patient remained free from self harm this shift.  Patient stated that he is not experiencing any SI at this time.

## 2015-09-15 NOTE — Progress Notes (Signed)
D: patient observed in room for most of the day.  Patient stayed very isolated to room.  Patient denies any SI at this time.  Patient compliant with medications.  Patient presenting with a flat affect.  Patient in no distress at this time.  A: support and encouragement given.  Medications given as prescribed.  q 15 min check completed  R: patient receptive of information and care given

## 2015-09-16 LAB — GLUCOSE, CAPILLARY
GLUCOSE-CAPILLARY: 74 mg/dL (ref 65–99)
Glucose-Capillary: 80 mg/dL (ref 65–99)
Glucose-Capillary: 92 mg/dL (ref 65–99)
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
## 2015-09-16 NOTE — Progress Notes (Signed)
D; patient observed in his room for most of the shift.  Patient remains very isolative to his room except for meal times. Patient denies SI or HI at this time.  Patient irritable and presenting with a flat affect.  Patient states that his depression has decreased.  Patient compliant with medications. Patient in no distress at this time  A support and encouragement provided.  Medications given as prescribed. q 15 min checks completed  R: patient receptive of information given

## 2015-09-16 NOTE — [facility] Group Notes (Signed)
[facility] Group Notes:  (Nursing/MHT/Case Management/Adjunct)  Date:  09/16/2015  Time:  12:58 PM  Type of Therapy:  Psychoeducational Skills  Participation Level:  Did Not Attend    Kyle [name] 09/16/2015, 12:58 PM

## 2015-09-16 NOTE — Progress Notes (Signed)
Recreation Therapy Notes  Date: 10.24.16 Time: 3:00 pm Location: Craft Room  Group Topic: Self-expression  Goal Area(s) Addresses:  Patient will effectively use art as a mean's of self-expression. Patient will recognize positive benefit of self-expression. Patient will be able to identify one emotion experienced during group session. Patient will identify use of art/self-expression as a coping skill.  Behavioral Response: Did not attend  Intervention: Two Faces of Me  Activity: Patients were given a blank face worksheet and instructed to draw or write how they felt when they were admitted to the hospital on one side and draw or write how they want to feel when they are discharged from the hospital on the other side.  Education: LRT educated patients on other forms of self-expression.  Education Outcome: Patient did not attend group.   Clinical Observations/Feedback: Patient did not attend group.  Kyle [name] M, LRT/CTRS 09/16/2015 4:39 PM 

## 2015-09-16 NOTE — Plan of Care (Signed)
Problem: Ineffective individual coping Goal: STG: Patient will remain free from self harm Outcome: Progressing Patient has remained free from self harm for this shift.  Patient denies any SI at this time.

## 2015-09-17 LAB — GLUCOSE, CAPILLARY
GLUCOSE-CAPILLARY: 78 mg/dL (ref 65–99)
GLUCOSE-CAPILLARY: 83 mg/dL (ref 65–99)

## 2015-09-17 NOTE — Plan of Care (Signed)
Problem: Alteration in mood Goal: LTG-Patient reports reduction in suicidal thoughts (Patient reports reduction in suicidal thoughts and is able to verbalize a safety plan for whenever patient is feeling suicidal)  Outcome: Progressing Patient denies any suicidal ideation during the shift. Goal: LTG-Pt's behavior demonstrates decreased signs of depression (Patient's behavior demonstrates decreased signs of depression to the point the patient is safe to return home and continue treatment in an outpatient setting)  Outcome: Progressing Patient engaged with peers in the milieu.

## 2015-09-17 NOTE — Plan of Care (Signed)
Problem: Ineffective individual coping Goal: STG: Patient will remain free from self harm Outcome: Not Met (add Reason) Calm and cooperative. amb w/ walker. No injuries noted. No voiced thoughts of hurting himself. q 15 min checks maintained for safety.

## 2015-09-17 NOTE — Progress Notes (Signed)
Patient alert oriented x3, calm and cooperative during the shift. Patient denies any SI.HI/AH during the shift. Patient was medication compliant during the shift. Patient interacted with peers and staff appropriately during the shift.

## 2015-09-17 NOTE — Progress Notes (Signed)
Calm and cooperative. Flat affect. Isolates in room except for medications and snacks. amb with walker. Med compliant. No voiced thoughts of hurting himself. No behavior problems noted. Will continue to monitor for safety and behavior.
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
## 2015-09-17 NOTE — Progress Notes (Signed)
Recreation Therapy Notes  Date: 10.25.16 Time: 3:00 pm Location: Craft Room  Group Topic: Goal Setting  Goal Area(s) Addresses:  Patient will write at least one goal. Patient will write at least one obstacle.  Behavioral Response: Did not attend  Intervention: Recovery Goal Chart  Activity: Patients were instructed to make a goal chart including goals towards their recovery, obstacles, the date they started working on their goal, and the date they achieved their goal.  Education: LRT educated patients on healthy ways they can celebrate reaching their goals.  Education Outcome: Patient did not attend group.   Clinical Observations/Feedback: Patient did not attend group.  Kanyia Heaslip M, LRT/CTRS 09/17/2015 4:27 PM 

## 2015-09-18 LAB — GLUCOSE, CAPILLARY: GLUCOSE-CAPILLARY: 93 mg/dL (ref 65–99)

## 2015-09-18 LAB — LITHIUM LEVEL: Lithium Lvl: 0.7 mmol/L (ref 0.60–1.20)

## 2015-09-18 NOTE — [facility] Group Notes (Signed)
[facility]  09/18/2015 2:47 PM  Type of Therapy:  Group Therapy  Participation Level:  Did Not Attend   Lulu Ridingngle, Mariana Wiederholt T, MSW, LCSWA 09/18/2015, 2:47 PM

## 2015-09-18 NOTE — Tx Team (Signed)
Interdisciplinary Treatment Plan Update (Adult)  Date:  09/18/2015 Time Reviewed:  5:47 PM  Progress in Treatment: Attending groups: No. Participating in groups:  No. Taking medication as prescribed:  Yes. Tolerating medication:  Yes., but complaining of side effects Family/Significant othe contact made:  Yes, individual(s) contacted:  Pts sister Kyle [name] Patient understands diagnosis:  Yes. Discussing patient identified problems/goals with staff:  Yes. Medical problems stabilized or resolved:  Yes. Denies suicidal/homicidal ideation: Yes. Issues/concerns per patient self-inventory:  No. Other:  New problem(s) identified: No, Describe:     Discharge Plan or Barriers:  Would benefit from ALF placement, but unable to do at this time unless PT able to pay privately.  Reason for Continuation of Hospitalization: Depression Medication stabilization Other; describe Coordinating after care plan  Comments:Mr. phone and still feels depressed and despondent. He hardly get out of bed. He does not participate in programming. He still complains he is on too much medication. He completed Levaquin course for urinary tract infection. He has no somatic complaints unfortunately we learned today that the patient will not be able to be placed in assisted living facility as due to lack of resources. We will apply for Medicaid for him so when he comes back to the hospital which is almost certain and will hopefully be able to place him then.   Estimated length of stay: up to 2 days  New goal(s):  Review of initial/current patient goals per problem list:   See plan of Care  Attendees: Patient:  Kyle [name] 10/26/20165:47 PM  Family:   10/26/20165:47 PM  Physician:  Kristine LineaJolanta Pucilowska 10/26/20165:47 PM  Nursing:    10/26/20165:47 PM  Case Manager:   10/26/20165:47 PM  Counselor:  Jake SharkSara Bruna Dills, LCSW 10/26/20165:47 PM  Other:  Hershal CoriaBeth Greene, LRT 10/26/20165:47 PM  Other:   10/26/20165:47 PM   Other:   10/26/20165:47 PM  Other:  10/26/20165:47 PM  Other:  10/26/20165:47 PM  Other:  10/26/20165:47 PM  Other:  10/26/20165:47 PM  Other:  10/26/20165:47 PM  Other:  10/26/20165:47 PM  Other:   10/26/20165:47 PM   Scribe for Treatment Team:   Glennon MacLaws, Peyton Rossner P, 09/18/2015, 5:47 PM, MSW, LCSW

## 2015-09-18 NOTE — [facility] Group Notes (Signed)
[facility] After[facility] Discharge Planning Group Note   09/18/2015 11:34 AM  Patient did not attend.  Jenel LucksJasmine Lewis, Clinical Social Work Intern 09/18/15  Beryl MeagerJason Terion Hedman, MSW,  Theresia MajorsLCSWA  09/18/15
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
## 2015-09-18 NOTE — Progress Notes (Signed)
Recreation Therapy Notes  Date: 10.26.16 Time: 3:00 pm Location: Craft Room  Group Topic: Self-esteem  Goal Area(s) Addresses:  Patient will write at least one positive trait. Patient will verbalize benefit of having a healthy self-esteem.  Behavioral Response: Did not attend  Intervention: I Am  Activity: Patients were given a worksheet with the letter I on it and instructed to fill the letter with as many positive traits about themselves as they could.  Education:LRT educated patients on ways they can increase their self-esteem.   Education Outcome: Patient did not attend group.   Clinical Observations/Feedback: Patient did not attend group.  Mehki Klumpp M, LRT/CTRS 09/18/2015 4:35 PM 

## 2015-09-18 NOTE — Progress Notes (Signed)
D: Patient denies SI/AVH.  Rates depression as a 2 although presents with flat, depressed affect.  Seclusive to room except for meals and medication pass.  Tremors noted to hands.  Utilizes a walker for ambulation without any difficulty.  A:  Medication given per orders.  Encouraged group attendance.  Encouragement and active listening provided.   R:  Medication compliant.  Safety maintained.

## 2015-09-18 NOTE — Progress Notes (Signed)
Patient ID: Suezanne JacquetMarvin D Ellefson, male   DOB: 1946/06/03, 69 y.o.   MRN: [id number] PER STATE REGULATIONS 482.30  THIS CHART WAS REVIEWED FOR MEDICAL NECESSITY WITH RESPECT TO THE PATIENT'S ADMISSION/ DURATION OF STAY.  NEXT REVIEW DATE: 09/22/2015  Willa RoughJENNIFER JONES Aidan Caloca, RN, BSN CASE MANAGER

## 2015-09-18 NOTE — Plan of Care (Signed)
Problem: Ineffective individual coping Goal: LTG: Patient will report a decrease in negative feelings Outcome: Progressing Rates depression as a 2 although affect does not portray decreased signs of depression.

## 2015-09-18 NOTE — Progress Notes (Signed)
Calm and cooperative. Flat affect. amb with walker. Med compliant. Isolates in room for medications. Refused po bedtime snacks. Denies SI/HI/AV/H noted. q 15 min checks maintained for safety. Slept 7.30 hours.

## 2015-09-18 NOTE — Plan of Care (Signed)
Problem: Ineffective individual coping Goal: STG: Patient will remain free from self harm Outcome: Not Met (add Reason) Calm and cooperative. Flat affect. Med compliant. No voiced thoughts of hurting himself. q 15 min checks maintained for safety.

## 2015-09-18 NOTE — [facility] Group Notes (Signed)
[facility] Group Notes:  (Nursing/MHT/Case Management/Adjunct)  Date:  09/18/2015  Time:  2:12 PM  Type of Therapy:  Psychoeducational Skills  Participation Level:  Did Not Attend   Lynelle SmokeCara Travis [facility] 09/18/2015, 2:12 PM

## 2015-09-19 LAB — GLUCOSE, CAPILLARY
GLUCOSE-CAPILLARY: 110 mg/dL — AB (ref 65–99)
Glucose-Capillary: 86 mg/dL (ref 65–99)

## 2015-09-19 MED ORDER — VENLAFAXINE HCL ER 150 MG PO CP24: 150.0000 mg | ORAL_CAPSULE | Freq: Every day | ORAL | Status: DC

## 2015-09-19 MED ORDER — VENLAFAXINE HCL ER 75 MG PO CP24
150.0000 mg | ORAL_CAPSULE | Freq: Every day | ORAL | Status: DC
  Administered 2015-09-20: 150 mg via ORAL
  Filled 2015-09-19: qty 2

## 2015-09-19 MED ORDER — OLANZAPINE 15 MG PO TABS: 30.0000 mg | ORAL_TABLET | Freq: Every day | ORAL | Status: DC

## 2015-09-19 MED ORDER — LITHIUM CARBONATE ER 300 MG PO TBCR: 300.0000 mg | EXTENDED_RELEASE_TABLET | Freq: Three times a day (TID) | ORAL | Status: DC

## 2015-09-19 MED ORDER — TUBERCULIN PPD 5 UNIT/0.1ML ID SOLN
5.0000 [IU] | Freq: Once | INTRADERMAL | Status: DC
  Filled 2015-09-19: qty 0.1

## 2015-09-19 MED ORDER — GABAPENTIN 400 MG PO CAPS: 400.0000 mg | ORAL_CAPSULE | Freq: Three times a day (TID) | ORAL | Status: DC

## 2015-09-19 MED ORDER — LORAZEPAM 1 MG PO TABS: 1.0000 mg | ORAL_TABLET | Freq: Every day | ORAL | Status: DC

## 2015-09-19 NOTE — Progress Notes (Signed)
Pt out for meals. Spends rest of day in bed.  No group attendance. Pleasant. Encouraged pt to attend group and socialize with peers. Pt verbalizes understanding but continues to stay in room. Will continue to assess and monitor for safety.

## 2015-09-19 NOTE — [facility] Group Notes (Signed)
[facility] Group Notes:  (Nursing/MHT/Case Management/Adjunct)  Date:  09/19/2015  Time:  2:18 PM  Type of Therapy:  Psychoeducational Skills  Participation Level:  Did Not Attend   Lynelle SmokeCara [facility] 09/19/2015, 2:18 PM

## 2015-09-19 NOTE — Progress Notes (Signed)
Recreation Therapy Notes  Date: 10.27.16 Time: 3:15 pm Location: Craft Room  Group Topic: Leisure Education  Goal Area(s) Addresses:  Patient will identify activities for each letter of the alphabet. Patient will verbalize ability to integrate positive leisure into life post d/c. Patient will verbalize ability to use leisure as a Associate Professorcoping skill.  Behavioral Response: Attentive, Interactive  Intervention: Leisure Alphabet  Activity: Patients were given a Leisure Information systems managerAlphabet worksheet and instructed to think of healthy leisure activities for each letter of the alphabet.  Education: LRT educated patients on what they need to participate in leisure.  Education Outcome: In group clarification offered  Clinical Observations/Feedback: Patient completed activity by writing healthy leisure activities down. Patient contributed to group discussion by stating some of the healthy leisure activities he wrote down.  Jacquelynn CreeGreene,Rylah Fukuda M, LRT/CTRS 09/19/2015 4:27 PM
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
## 2015-09-19 NOTE — Plan of Care (Signed)
Problem: Ineffective individual coping Goal: STG: Patient will remain free from self harm Outcome: Progressing No self harm reported or observed     

## 2015-09-19 NOTE — Plan of Care (Signed)
Problem: Alteration in mood Goal: LTG-Patient reports reduction in suicidal thoughts (Patient reports reduction in suicidal thoughts and is able to verbalize a safety plan for whenever patient is feeling suicidal)  Outcome: Progressing Patient denies SI/HI.      

## 2015-09-19 NOTE — Progress Notes (Signed)
D: Pt denies SI/HI/AVH. Pt is pleasant and cooperative, affect is flat and sad but brightens upon approach.Patient appears less anxious and he is interacting with peers and staff appropriately.  A: Pt was offered support and encouragement. Pt was given scheduled medications. Pt was encouraged to attend groups. Q 15 minute checks were done for safety.  R: Patient did not attend evening group, Pt is compliant with medication. Pt has no complaints.Pt receptive to treatment and safety maintained on unit.
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
## 2015-09-20 LAB — GLUCOSE, CAPILLARY: GLUCOSE-CAPILLARY: 87 mg/dL (ref 65–99)

## 2015-09-20 NOTE — Plan of Care (Signed)
Problem: Alteration in mood Goal: LTG-Patient reports reduction in suicidal thoughts (Patient reports reduction in suicidal thoughts and is able to verbalize a safety plan for whenever patient is feeling suicidal)  Outcome: Progressing Patient denies SI/HI.      

## 2015-09-20 NOTE — Progress Notes (Addendum)
Patient with discharge instructions, He is appropriate to be discharged, no suicidal ideations, states "My sister will pick me up" Patient does voice understanding of discharge instructions, prescriptions given to patient. Patient sister here and transported home. Patient escorted out per staff.

## 2015-09-20 NOTE — Progress Notes (Signed)
D: Pt denies SI/HI/AVH. Pt is pleasant and cooperative, affect flat and sad but brightens upon approach, he appears less anxious and he is interacting with peers and staff appropriately.  A: Pt was offered support and encouragement. Pt was given scheduled medications. Pt was encouraged to attend groups. Q 15 minute checks were done for safety.  R:Pt did not attend evening groups and interacts well with peers and staff. Pt is compliant with medication. Pt has no complaints.Pt receptive to treatment and safety maintained on unit.

## 2015-09-20 NOTE — Tx Team (Signed)
Interdisciplinary Treatment Plan Update (Adult)  Date:  09/20/2015 Time Reviewed:  9:33 AM  Progress in Treatment: Attending groups: No. Participating in groups:  No. Taking medication as prescribed:  Yes. Tolerating medication:  Yes. Family/Significant othe contact made:  Yes, individual(s) contacted:  Sister  Patient understands diagnosis:  No. and As evidenced by:  Limited insight Discussing patient identified problems/goals with staff:  Yes. Medical problems stabilized or resolved:  Yes. Denies suicidal/homicidal ideation: Yes. Issues/concerns per patient self-inventory:  Yes. Other:  New problem(s) identified: No, Describe:  NA  Discharge Plan or Barriers: Pt plans to return home and follow up with outpatient.    Reason for Continuation of Hospitalization: Depression Medication stabilization Suicidal ideation  Comments:Mr. Sadowski is much improved. He is out of bed and out of his room. He interacts with peers and staff appropriately. He went to a few groups. He tolerates medications well although bitterly complains about multiple pills he has to take. It is unlikely that he would be compliant with his medications at home. The patient will be discharged tomorrow to home with home health. We were unable to place him as he has no Medicaid. Medicaid application was completed and submitted yesterday. He has no somatic complaints except for tremor. He is a diagnosis of familial tremor that is addressed with medications.   Estimated length of stay: Pt will likely d/c today.   New goal(s):  NA  Review of initial/current patient goals per problem list:   1.  Goal(s): Patient will participate in aftercare plan * Met:  * Target date: at discharge * As evidenced by: Patient will participate within aftercare plan AEB aftercare provider and housing plan at discharge being identified.   2.  Goal (s): Patient will exhibit decreased depressive symptoms and suicidal ideations. * Met:   *  Target date: at discharge * As evidenced by: Patient will utilize self rating of depression at 3 or below and demonstrate decreased signs of depression or be deemed stable for discharge by MD.   3.  Goal(s): Patient will demonstrate decreased signs and symptoms of anxiety. * Met:  * Target date: at discharge * As evidenced by: Patient will utilize self rating of anxiety at 3 or below and demonstrated decreased signs of anxiety, or be deemed stable for discharge by MD  Attendees: Patient:  Kyle [name] 10/28/20169:33 AM  Family:   10/28/20169:33 AM  Physician:   Dr. Bary Leriche  10/28/20169:33 AM  Nursing:   Marcie Bal, RN  10/28/20169:33 AM  Case Manager:   10/28/20169:33 AM  Counselor:   10/28/20169:33 AM  Other:  Wray Kearns, [place]  10/28/20169:33 AM  Other:  Everitt Amber, LRT  10/28/20169:33 AM  Other:   10/28/20169:33 AM  Other:  10/28/20169:33 AM  Other:  10/28/20169:33 AM  Other:  10/28/20169:33 AM  Other:  10/28/20169:33 AM  Other:  10/28/20169:33 AM  Other:  10/28/20169:33 AM  Other:   10/28/20169:33 AM   Scribe for Treatment Team:   Wray Kearns MSW, [facility]  09/20/2015, 9:33 AM

## 2015-09-20 NOTE — [facility] Suicide Risk Assessment (Signed)
[facility] INPATIENT:  Family/Significant Other Suicide Prevention Education  Suicide Prevention Education:  Education Completed;Margaret [phone] has been identified by the patient as the family member/significant other with whom the patient will be residing, and identified as the person(s) who will aid the patient in the event of a mental health crisis (suicidal ideations/suicide attempt).  With written consent from the patient, the family member/significant other has been provided the following suicide prevention education, prior to the and/or following the discharge of the patient.  The suicide prevention education provided includes the following:  Suicide risk factors  Suicide prevention and interventions  National Suicide Hotline telephone number  [facility] assessment telephone number  [facility] Emergency Assistance 911  [facility] and/or Residential Mobile Crisis Unit telephone number  Request made of family/significant other to:  Remove weapons (e.g., guns, rifles, knives), all items previously/currently identified as safety concern.    Remove drugs/medications (over-the-counter, prescriptions, illicit drugs), all items previously/currently identified as a safety concern.  The family member/significant other verbalizes understanding of the suicide prevention education information provided.  The family member/significant other agrees to remove the items of safety concern listed above.  [employer] EnergyCandace L Luismanuel Corman MSW, LCSWA  09/20/2015, 9:33 AM

## 2015-09-25 ENCOUNTER — Encounter: Payer: Self-pay | Admitting: Emergency Medicine

## 2015-09-25 ENCOUNTER — Inpatient Hospital Stay
Admission: EM | Admit: 2015-09-25 | Discharge: 2015-10-15 | DRG: 309 | Disposition: A | Discharge status: Other Acute Inpatient Hospital | Payer: Medicare Other | Attending: Specialist | Admitting: Specialist

## 2015-09-25 ENCOUNTER — Inpatient Hospital Stay
Admit: 2015-09-25 | Discharge: 2015-09-25 | Disposition: A | Discharge status: Home / Self Care | Payer: Medicare Other | Attending: Internal Medicine | Admitting: Internal Medicine

## 2015-09-25 ENCOUNTER — Emergency Department: Payer: Medicare Other

## 2015-09-25 DIAGNOSIS — N401 Enlarged prostate with lower urinary tract symptoms: Secondary | ICD-10-CM | POA: Diagnosis present

## 2015-09-25 DIAGNOSIS — Z9119 Patient's noncompliance with other medical treatment and regimen: Secondary | ICD-10-CM | POA: Diagnosis not present

## 2015-09-25 DIAGNOSIS — E11649 Type 2 diabetes mellitus with hypoglycemia without coma: Secondary | ICD-10-CM | POA: Diagnosis present

## 2015-09-25 DIAGNOSIS — Z7984 Long term (current) use of oral hypoglycemic drugs: Secondary | ICD-10-CM | POA: Diagnosis not present

## 2015-09-25 DIAGNOSIS — Z7982 Long term (current) use of aspirin: Secondary | ICD-10-CM | POA: Diagnosis not present

## 2015-09-25 DIAGNOSIS — I471 Supraventricular tachycardia: Secondary | ICD-10-CM | POA: Diagnosis present

## 2015-09-25 DIAGNOSIS — R45851 Suicidal ideations: Secondary | ICD-10-CM | POA: Diagnosis present

## 2015-09-25 DIAGNOSIS — I10 Essential (primary) hypertension: Secondary | ICD-10-CM | POA: Diagnosis present

## 2015-09-25 DIAGNOSIS — Z794 Long term (current) use of insulin: Secondary | ICD-10-CM

## 2015-09-25 DIAGNOSIS — Z79899 Other long term (current) drug therapy: Secondary | ICD-10-CM | POA: Diagnosis not present

## 2015-09-25 DIAGNOSIS — I679 Cerebrovascular disease, unspecified: Secondary | ICD-10-CM | POA: Diagnosis present

## 2015-09-25 DIAGNOSIS — R338 Other retention of urine: Secondary | ICD-10-CM | POA: Diagnosis present

## 2015-09-25 DIAGNOSIS — I472 Ventricular tachycardia, unspecified: Secondary | ICD-10-CM

## 2015-09-25 DIAGNOSIS — G250 Essential tremor: Secondary | ICD-10-CM | POA: Diagnosis present

## 2015-09-25 DIAGNOSIS — Z8673 Personal history of transient ischemic attack (TIA), and cerebral infarction without residual deficits: Secondary | ICD-10-CM

## 2015-09-25 DIAGNOSIS — E785 Hyperlipidemia, unspecified: Secondary | ICD-10-CM | POA: Diagnosis present

## 2015-09-25 DIAGNOSIS — G2581 Restless legs syndrome: Secondary | ICD-10-CM | POA: Diagnosis present

## 2015-09-25 DIAGNOSIS — F319 Bipolar disorder, unspecified: Secondary | ICD-10-CM | POA: Diagnosis present

## 2015-09-25 DIAGNOSIS — F3130 Bipolar disorder, current episode depressed, mild or moderate severity, unspecified: Secondary | ICD-10-CM | POA: Diagnosis present

## 2015-09-25 DIAGNOSIS — Z91199 Patient's noncompliance with other medical treatment and regimen due to unspecified reason: Secondary | ICD-10-CM

## 2015-09-25 HISTORY — DX: Supraventricular tachycardia: I47.1

## 2015-09-25 LAB — URINE DRUG SCREEN, QUALITATIVE (ARMC ONLY)
Amphetamines, Ur Screen: NOT DETECTED
BENZODIAZEPINE, UR SCRN: NOT DETECTED
Barbiturates, Ur Screen: NOT DETECTED
CANNABINOID 50 NG, UR ~~LOC~~: NOT DETECTED
Cocaine Metabolite,Ur ~~LOC~~: NOT DETECTED
MDMA (Ecstasy)Ur Screen: NOT DETECTED
Methadone Scn, Ur: NOT DETECTED
OPIATE, UR SCREEN: NOT DETECTED
PHENCYCLIDINE (PCP) UR S: NOT DETECTED
Tricyclic, Ur Screen: NOT DETECTED

## 2015-09-25 LAB — CBC WITH DIFFERENTIAL/PLATELET
BASOS ABS: 0.1 10*3/uL (ref 0–0.1)
BASOS PCT: 0 %
EOS ABS: 0 10*3/uL (ref 0–0.7)
Eosinophils Relative: 0 %
HCT: 44 % (ref 40.0–52.0)
HEMOGLOBIN: 14.9 g/dL (ref 13.0–18.0)
Lymphocytes Relative: 9 %
Lymphs Abs: 2.2 10*3/uL (ref 1.0–3.6)
MCH: 31.7 pg (ref 26.0–34.0)
MCHC: 33.9 g/dL (ref 32.0–36.0)
MCV: 93.7 fL (ref 80.0–100.0)
MONOS PCT: 8 %
Monocytes Absolute: 2 10*3/uL — ABNORMAL HIGH (ref 0.2–1.0)
NEUTROS PCT: 83 %
Neutro Abs: 20.1 10*3/uL — ABNORMAL HIGH (ref 1.4–6.5)
Platelets: 325 10*3/uL (ref 150–440)
RBC: 4.7 MIL/uL (ref 4.40–5.90)
RDW: 13 % (ref 11.5–14.5)
WBC: 24.4 10*3/uL — ABNORMAL HIGH (ref 3.8–10.6)

## 2015-09-25 LAB — URINALYSIS COMPLETE WITH MICROSCOPIC (ARMC ONLY)
BACTERIA UA: NONE SEEN
Bilirubin Urine: NEGATIVE
Glucose, UA: NEGATIVE mg/dL
HGB URINE DIPSTICK: NEGATIVE
Leukocytes, UA: NEGATIVE
NITRITE: NEGATIVE
PH: 6 (ref 5.0–8.0)
PROTEIN: 100 mg/dL — AB
SPECIFIC GRAVITY, URINE: 1.021 (ref 1.005–1.030)

## 2015-09-25 LAB — COMPREHENSIVE METABOLIC PANEL
ALBUMIN: 4.1 g/dL (ref 3.5–5.0)
ALK PHOS: 80 U/L (ref 38–126)
ALT: 27 U/L (ref 17–63)
ANION GAP: 10 (ref 5–15)
AST: 51 U/L — ABNORMAL HIGH (ref 15–41)
BILIRUBIN TOTAL: 2.3 mg/dL — AB (ref 0.3–1.2)
BUN: 19 mg/dL (ref 6–20)
CALCIUM: 9.3 mg/dL (ref 8.9–10.3)
CO2: 23 mmol/L (ref 22–32)
Chloride: 104 mmol/L (ref 101–111)
Creatinine, Ser: 0.89 mg/dL (ref 0.61–1.24)
GLUCOSE: 133 mg/dL — AB (ref 65–99)
POTASSIUM: 3.6 mmol/L (ref 3.5–5.1)
Sodium: 137 mmol/L (ref 135–145)
TOTAL PROTEIN: 7.5 g/dL (ref 6.5–8.1)

## 2015-09-25 LAB — TROPONIN I
TROPONIN I: 0.07 ng/mL — AB (ref <0.031)
TROPONIN I: 0.08 ng/mL — AB (ref <0.031)

## 2015-09-25 LAB — ACETAMINOPHEN LEVEL

## 2015-09-25 LAB — MAGNESIUM: MAGNESIUM: 2.5 mg/dL — AB (ref 1.7–2.4)

## 2015-09-25 LAB — ETHANOL

## 2015-09-25 LAB — TSH: TSH: 0.512 u[IU]/mL (ref 0.350–4.500)

## 2015-09-25 LAB — SALICYLATE LEVEL

## 2015-09-25 LAB — LITHIUM LEVEL

## 2015-09-25 MED ORDER — MORPHINE SULFATE (PF) 2 MG/ML IV SOLN
1.0000 mg | INTRAVENOUS | Status: DC | PRN
  Administered 2015-10-08: 1 mg via INTRAVENOUS
  Filled 2015-09-25: qty 1

## 2015-09-25 MED ORDER — SIMVASTATIN 20 MG PO TABS
10.0000 mg | ORAL_TABLET | Freq: Every day | ORAL | Status: DC
  Administered 2015-09-25 – 2015-10-04 (×10): 10 mg via ORAL
  Filled 2015-09-25 (×5): qty 1
  Filled 2015-09-25: qty 2
  Filled 2015-09-25 (×5): qty 1

## 2015-09-25 MED ORDER — OLANZAPINE 10 MG PO TABS
20.0000 mg | ORAL_TABLET | Freq: Every day | ORAL | Status: DC
  Administered 2015-09-25 – 2015-10-14 (×20): 20 mg via ORAL
  Filled 2015-09-25 (×21): qty 2

## 2015-09-25 MED ORDER — ADENOSINE 6 MG/2ML IV SOLN
6.0000 mg | Freq: Once | INTRAVENOUS | Status: AC
  Administered 2015-09-25: 6 mg via INTRAVENOUS

## 2015-09-25 MED ORDER — ONDANSETRON HCL 4 MG/2ML IJ SOLN: 4.0000 mg | Freq: Four times a day (QID) | INTRAMUSCULAR | Status: DC | PRN

## 2015-09-25 MED ORDER — OLANZAPINE 10 MG PO TABS
10.0000 mg | ORAL_TABLET | Freq: Every day | ORAL | Status: DC
  Administered 2015-09-25 – 2015-10-06 (×12): 10 mg via ORAL
  Filled 2015-09-25 (×12): qty 1

## 2015-09-25 MED ORDER — ADENOSINE 6 MG/2ML IV SOLN
INTRAVENOUS | Status: AC
  Administered 2015-09-25: 6 mg via INTRAVENOUS
  Filled 2015-09-25: qty 2

## 2015-09-25 MED ORDER — METFORMIN HCL 500 MG PO TABS
500.0000 mg | ORAL_TABLET | Freq: Two times a day (BID) | ORAL | Status: DC
  Administered 2015-09-25 – 2015-10-13 (×31): 500 mg via ORAL
  Filled 2015-09-25 (×32): qty 1

## 2015-09-25 MED ORDER — AMLODIPINE BESYLATE 10 MG PO TABS
10.0000 mg | ORAL_TABLET | Freq: Every day | ORAL | Status: DC
  Administered 2015-09-25: 10 mg via ORAL
  Filled 2015-09-25: qty 1

## 2015-09-25 MED ORDER — AMIODARONE HCL IN DEXTROSE 360-4.14 MG/200ML-% IV SOLN
60.0000 mg/h | INTRAVENOUS | Status: DC
  Administered 2015-09-25: 60 mg/h via INTRAVENOUS
  Filled 2015-09-25: qty 200

## 2015-09-25 MED ORDER — VENLAFAXINE HCL ER 75 MG PO CP24
150.0000 mg | ORAL_CAPSULE | Freq: Every day | ORAL | Status: DC
  Administered 2015-09-26 – 2015-10-08 (×13): 150 mg via ORAL
  Filled 2015-09-25: qty 1
  Filled 2015-09-25 (×7): qty 2
  Filled 2015-09-25: qty 1
  Filled 2015-09-25 (×3): qty 2
  Filled 2015-09-25: qty 1
  Filled 2015-09-25 (×2): qty 2

## 2015-09-25 MED ORDER — SODIUM CHLORIDE 0.9 % IV SOLN
INTRAVENOUS | Status: DC
  Administered 2015-09-25 – 2015-09-26 (×3): via INTRAVENOUS

## 2015-09-25 MED ORDER — LORAZEPAM 1 MG PO TABS
1.0000 mg | ORAL_TABLET | Freq: Every day | ORAL | Status: DC
Start: 2015-09-25 — End: 2015-10-07
  Administered 2015-09-25 – 2015-10-06 (×12): 1 mg via ORAL
  Filled 2015-09-25 (×12): qty 1

## 2015-09-25 MED ORDER — LITHIUM CARBONATE ER 300 MG PO TBCR
600.0000 mg | EXTENDED_RELEASE_TABLET | Freq: Every day | ORAL | Status: DC
  Administered 2015-09-25 – 2015-09-29 (×5): 600 mg via ORAL
  Filled 2015-09-25 (×7): qty 2

## 2015-09-25 MED ORDER — ADENOSINE 12 MG/4ML IV SOLN
INTRAVENOUS | Status: AC
  Filled 2015-09-25: qty 4

## 2015-09-25 MED ORDER — PRIMIDONE 50 MG PO TABS
50.0000 mg | ORAL_TABLET | Freq: Every day | ORAL | Status: DC
  Administered 2015-09-25 – 2015-10-10 (×15): 50 mg via ORAL
  Filled 2015-09-25 (×15): qty 1

## 2015-09-25 MED ORDER — GABAPENTIN 300 MG PO CAPS
300.0000 mg | ORAL_CAPSULE | Freq: Two times a day (BID) | ORAL | Status: DC
  Administered 2015-09-25 – 2015-10-14 (×38): 300 mg via ORAL
  Filled 2015-09-25 (×38): qty 1

## 2015-09-25 MED ORDER — AMIODARONE HCL IN DEXTROSE 360-4.14 MG/200ML-% IV SOLN
30.0000 mg/h | INTRAVENOUS | Status: DC
Start: 2015-09-25 — End: 2015-09-27
  Administered 2015-09-25 – 2015-09-26 (×3): 30 mg/h via INTRAVENOUS
  Filled 2015-09-25 (×7): qty 200

## 2015-09-25 MED ORDER — AMIODARONE LOAD VIA INFUSION
150.0000 mg | Freq: Once | INTRAVENOUS | Status: AC
  Administered 2015-09-25: 150 mg via INTRAVENOUS
  Filled 2015-09-25: qty 83.34

## 2015-09-25 MED ORDER — ACETAMINOPHEN 325 MG PO TABS
650.0000 mg | ORAL_TABLET | Freq: Four times a day (QID) | ORAL | Status: DC | PRN
  Administered 2015-09-29: 650 mg via ORAL
  Filled 2015-09-25: qty 2

## 2015-09-25 MED ORDER — DIVALPROEX SODIUM ER 500 MG PO TB24
2000.0000 mg | ORAL_TABLET | Freq: Every day | ORAL | Status: DC
  Administered 2015-09-25: 2000 mg via ORAL
  Filled 2015-09-25 (×2): qty 4

## 2015-09-25 MED ORDER — SODIUM CHLORIDE 0.9 % IJ SOLN
3.0000 mL | Freq: Two times a day (BID) | INTRAMUSCULAR | Status: DC
  Administered 2015-09-27 – 2015-10-13 (×27): 3 mL via INTRAVENOUS

## 2015-09-25 MED ORDER — HYDROCODONE-ACETAMINOPHEN 5-325 MG PO TABS
1.0000 | ORAL_TABLET | ORAL | Status: DC | PRN
  Administered 2015-10-01 – 2015-10-04 (×6): 1 via ORAL
  Administered 2015-10-05: 2 via ORAL
  Administered 2015-10-05 – 2015-10-11 (×7): 1 via ORAL
  Filled 2015-09-25: qty 1
  Filled 2015-09-25: qty 2
  Filled 2015-09-25 (×13): qty 1

## 2015-09-25 MED ORDER — ACETAMINOPHEN 650 MG RE SUPP: 650.0000 mg | Freq: Four times a day (QID) | RECTAL | Status: DC | PRN

## 2015-09-25 MED ORDER — ASPIRIN EC 325 MG PO TBEC
325.0000 mg | DELAYED_RELEASE_TABLET | Freq: Every day | ORAL | Status: DC
  Administered 2015-09-25 – 2015-09-26 (×2): 325 mg via ORAL
  Filled 2015-09-25 (×2): qty 1

## 2015-09-25 MED ORDER — SODIUM CHLORIDE 0.9 % IV BOLUS (SEPSIS)
1000.0000 mL | Freq: Once | INTRAVENOUS | Status: AC
  Administered 2015-09-25: 1000 mL via INTRAVENOUS

## 2015-09-25 MED ORDER — METOPROLOL TARTRATE 25 MG PO TABS
25.0000 mg | ORAL_TABLET | Freq: Two times a day (BID) | ORAL | Status: DC
  Administered 2015-09-25 – 2015-10-14 (×29): 25 mg via ORAL
  Filled 2015-09-25 (×36): qty 1

## 2015-09-25 MED ORDER — ONDANSETRON HCL 4 MG PO TABS: 4.0000 mg | ORAL_TABLET | Freq: Four times a day (QID) | ORAL | Status: DC | PRN

## 2015-09-25 NOTE — ED Notes (Addendum)
Pt brought in via EMS; original call placed by sister because pt stated he wants to die.  While in route via EMS, pt complaining of chest pain and goes in SVT on monitor.  Pt in SVT upon arrival, alert to self and location with delayed responses.  MD at bedside; pt connected to zoll.  Pt eyes blood shot; denies taking any pills, alcohol intake.  Pt with dried emesis (dark brown/green) around mouth; denies getting sick.

## 2015-09-25 NOTE — ED Notes (Signed)
BEHAVIORAL HEALTH ROUNDING Patient sleeping: No. Patient alert and oriented: yes Behavior appropriate: Yes.   Nutrition and fluids offered: Yes  Toileting and hygiene offered: Yes  Sitter present: yes Law enforcement present: Yes   ENVIRONMENTAL ASSESSMENT Potentially harmful objects out of patient reach: Yes.   Personal belongings secured: Yes.   Patient dressed in hospital provided attire only: Yes.   Plastic bags out of patient reach: Yes.   Patient care equipment (cords, cables, call bells, lines, and drains) shortened, removed, or accounted for: Yes.   Equipment and supplies removed from bottom of stretcher: Yes.   Potentially toxic materials out of patient reach: Yes.   Sharps container removed or out of patient reach: Yes.     
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
## 2015-09-25 NOTE — ED Notes (Signed)
Critical troponin 0.07 called via lab. MD made aware.

## 2015-09-25 NOTE — ED Notes (Signed)
Attempted to call report; RN with another pt at this time.
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
## 2015-09-25 NOTE — ED Notes (Signed)
BEHAVIORAL HEALTH ROUNDING Patient sleeping: No. Patient alert and oriented: yes Behavior appropriate: Yes.  ;  Nutrition and fluids offered: Yes  Toileting and hygiene offered: Yes  Sitter present: yes Law enforcement present: Yes  

## 2015-09-25 NOTE — ED Notes (Signed)
Pharmacy given bag of home medications to stored.

## 2015-09-25 NOTE — ED Notes (Signed)
15 beat run Vtach at 1442. Pt resting at this time, vital signs WDL, no signs of distress.  Strip printed and MD made aware.

## 2015-09-26 DIAGNOSIS — Z9119 Patient's noncompliance with other medical treatment and regimen: Secondary | ICD-10-CM

## 2015-09-26 DIAGNOSIS — F3130 Bipolar disorder, current episode depressed, mild or moderate severity, unspecified: Secondary | ICD-10-CM

## 2015-09-26 DIAGNOSIS — Z91199 Patient's noncompliance with other medical treatment and regimen due to unspecified reason: Secondary | ICD-10-CM

## 2015-09-26 LAB — BASIC METABOLIC PANEL
Anion gap: 5 (ref 5–15)
BUN: 13 mg/dL (ref 6–20)
CHLORIDE: 106 mmol/L (ref 101–111)
CO2: 28 mmol/L (ref 22–32)
CREATININE: 0.79 mg/dL (ref 0.61–1.24)
Calcium: 8.4 mg/dL — ABNORMAL LOW (ref 8.9–10.3)
GFR calc non Af Amer: >60 mL/min (ref >60)
Glucose, Bld: 103 mg/dL — ABNORMAL HIGH (ref 65–99)
Potassium: 3.1 mmol/L — ABNORMAL LOW (ref 3.5–5.1)
Sodium: 139 mmol/L (ref 135–145)

## 2015-09-26 LAB — GLUCOSE, CAPILLARY
Glucose-Capillary: 105 mg/dL — ABNORMAL HIGH (ref 65–99)
Glucose-Capillary: 96 mg/dL (ref 65–99)

## 2015-09-26 LAB — URINALYSIS COMPLETE WITH MICROSCOPIC (ARMC ONLY)
BILIRUBIN URINE: NEGATIVE
Bacteria, UA: NONE SEEN
GLUCOSE, UA: NEGATIVE mg/dL
HGB URINE DIPSTICK: NEGATIVE
KETONES UR: NEGATIVE mg/dL
Leukocytes, UA: NEGATIVE
NITRITE: NEGATIVE
Protein, ur: NEGATIVE mg/dL
SPECIFIC GRAVITY, URINE: 1.008 (ref 1.005–1.030)
Squamous Epithelial / LPF: NONE SEEN
pH: 6 (ref 5.0–8.0)

## 2015-09-26 LAB — CBC
HEMATOCRIT: 36.3 % — AB (ref 40.0–52.0)
HEMOGLOBIN: 12.3 g/dL — AB (ref 13.0–18.0)
MCH: 31.8 pg (ref 26.0–34.0)
MCHC: 33.9 g/dL (ref 32.0–36.0)
MCV: 94 fL (ref 80.0–100.0)
Platelets: 247 10*3/uL (ref 150–440)
RBC: 3.86 MIL/uL — ABNORMAL LOW (ref 4.40–5.90)
RDW: 13 % (ref 11.5–14.5)
WBC: 12.9 10*3/uL — ABNORMAL HIGH (ref 3.8–10.6)

## 2015-09-26 LAB — TROPONIN I: Troponin I: <0.03 ng/mL (ref <0.031)

## 2015-09-26 LAB — HEMOGLOBIN A1C: Hgb A1c MFr Bld: 5.4 % (ref 4.0–6.0)

## 2015-09-26 MED ORDER — INSULIN ASPART 100 UNIT/ML ~~LOC~~ SOLN
0.0000 [IU] | Freq: Three times a day (TID) | SUBCUTANEOUS | Status: DC
  Administered 2015-09-27 – 2015-09-29 (×2): 1 [IU] via SUBCUTANEOUS
  Administered 2015-09-30 (×2): 2 [IU] via SUBCUTANEOUS
  Administered 2015-10-07 – 2015-10-14 (×3): 1 [IU] via SUBCUTANEOUS
  Filled 2015-09-26 (×4): qty 1
  Filled 2015-09-26: qty 2
  Filled 2015-09-26: qty 1

## 2015-09-26 MED ORDER — ENOXAPARIN SODIUM 40 MG/0.4ML ~~LOC~~ SOLN
40.0000 mg | SUBCUTANEOUS | Status: DC
  Administered 2015-09-26 – 2015-10-14 (×18): 40 mg via SUBCUTANEOUS
  Filled 2015-09-26 (×18): qty 0.4

## 2015-09-26 MED ORDER — ASPIRIN 81 MG PO CHEW
81.0000 mg | CHEWABLE_TABLET | Freq: Every day | ORAL | Status: DC
Start: 2015-09-26 — End: 2015-10-15
  Administered 2015-09-26 – 2015-10-13 (×17): 81 mg via ORAL
  Filled 2015-09-26 (×17): qty 1

## 2015-09-26 MED ORDER — POTASSIUM CHLORIDE 20 MEQ PO PACK
20.0000 meq | PACK | Freq: Two times a day (BID) | ORAL | Status: DC
  Administered 2015-09-26 – 2015-10-01 (×11): 20 meq via ORAL
  Filled 2015-09-26 (×11): qty 1

## 2015-09-26 MED ORDER — INSULIN ASPART 100 UNIT/ML ~~LOC~~ SOLN: 0.0000 [IU] | Freq: Every day | SUBCUTANEOUS | Status: DC
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
## 2015-09-26 NOTE — Progress Notes (Signed)
09/26/15 0955  Clinical Encounter Type  Visited With Patient  Visit Type Initial;Spiritual support;Psychological support;Behavioral Health  Referral From Nurse  Consult/Referral To Chaplain  Spiritual Encounters  Spiritual Needs Emotional  Stress Factors  Patient Stress Factors Exhausted;Loss of control  Met w/patient who was unresponsive. Patient was eating breakfast w/aid of medical staff. Advised patient & staff sitter that he can contact chaplain any time and I will follow up with him in the interim during rounds. Chap. Melisa Donofrio G. [place]
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
## 2015-09-26 NOTE — Progress Notes (Signed)
Patient still having 1:1 sitter at bedside , with amiodarone drip infusing , in no acute distress, care ongoing.
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
## 2015-09-26 NOTE — Care Management (Signed)
[facility].  He is under IVC for suicide ideations.  There are multiple CM and CSW referrals entered.  Patient stated to CM this day does not want to talk to anyone right now.  he currently has 1:1 sitter.  Will reattempt assessment.  UPdated CSW

## 2015-09-26 NOTE — Care Management (Signed)
Spoke with Dr Toni Amendlapacs.  He states that a long range plan for this patient includes recognition that he will not be ab le to live on his own.   His sister is his primary contact.  He does not have medicaid that might cover living in a supervised environment.  Unsure at present if this payor source has been investigated.  Short range [facility] when he is medically stable

## 2015-09-26 NOTE — Progress Notes (Signed)
Pt had not void on shift, IVF infusing at 100 ml/hr. Patient holding 578 ml of urine per bladder scan. MD, Diamon notified, order received to put in a foley catheter. Patient tolerated well. Will continue to monitor.

## 2015-09-27 LAB — BASIC METABOLIC PANEL
ANION GAP: 4 — AB (ref 5–15)
BUN: 6 mg/dL (ref 6–20)
CALCIUM: 8.6 mg/dL — AB (ref 8.9–10.3)
CO2: 28 mmol/L (ref 22–32)
Chloride: 112 mmol/L — ABNORMAL HIGH (ref 101–111)
Creatinine, Ser: 0.66 mg/dL (ref 0.61–1.24)
GFR calc non Af Amer: >60 mL/min (ref >60)
Glucose, Bld: 91 mg/dL (ref 65–99)
Potassium: 3.7 mmol/L (ref 3.5–5.1)
SODIUM: 144 mmol/L (ref 135–145)

## 2015-09-27 LAB — GLUCOSE, CAPILLARY
GLUCOSE-CAPILLARY: 90 mg/dL (ref 65–99)
GLUCOSE-CAPILLARY: 124 mg/dL — AB (ref 65–99)
Glucose-Capillary: 74 mg/dL (ref 65–99)
Glucose-Capillary: 82 mg/dL (ref 65–99)

## 2015-09-27 MED ORDER — AMIODARONE HCL 200 MG PO TABS
400.0000 mg | ORAL_TABLET | Freq: Every day | ORAL | Status: DC
  Administered 2015-09-27 – 2015-10-14 (×18): 400 mg via ORAL
  Filled 2015-09-27 (×18): qty 2

## 2015-09-27 MED ORDER — AMIODARONE HCL 400 MG PO TABS: 400.0000 mg | ORAL_TABLET | Freq: Every day | ORAL | Status: DC

## 2015-09-27 MED ORDER — METOPROLOL TARTRATE 25 MG PO TABS: 25.0000 mg | ORAL_TABLET | Freq: Two times a day (BID) | ORAL | Status: DC

## 2015-09-27 MED ORDER — TAMSULOSIN HCL 0.4 MG PO CAPS
0.4000 mg | ORAL_CAPSULE | Freq: Every day | ORAL | Status: DC
  Administered 2015-09-27 – 2015-10-14 (×18): 0.4 mg via ORAL
  Filled 2015-09-27 (×19): qty 1

## 2015-09-27 NOTE — Progress Notes (Signed)
Patient still has not voided, bladder scan showed 749ml. Dr. Juliene PinaMody notified, foley and flomax ordered.  Trudee KusterBrandi R [place]

## 2015-09-27 NOTE — Progress Notes (Signed)
Patient family member called to notify that patient's wife passed away last night. Dr. Toni Amendlapacs, Dr. Juliene PinaMody, and Chaplain notified to be prepared when family member comes later today to talk with patient about passing. Currently patient is up in chair, flat affect. Ate breakfast this morning with assistance. Patient very weak when trying to get out of bed to chair, Dr. Juliene PinaMody notified and PT consulted. Fluids and foley d/c'd. Oxygen removed and sats stable at 100% on RA. Trudee KusterBrandi R [place]
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
## 2015-09-27 NOTE — Progress Notes (Signed)
   09/27/15 1300  Clinical Encounter Type  Visited With Family;Health care provider  Visit Type Spiritual support;Social support  Referral From Nurse  Consult/Referral To Chaplain  Spiritual Encounters  Spiritual Needs Emotional;Other (Comment)  Stress Factors  Family Stress Factors Loss;Other (Comment)  Chaplain was paged to support family as they discussed the passing of the patient's wife with patient. Met with family prior to their discussion with patient. Tried to provide support and consolation. I stood outside the room along with security and and police as we served as a non anxious and compassionate presence. Patient appeared to take the news considerably well. Nurse came out and retrieved the family member's purse that I was holding and said that they felt comfortable with the way he handled the news and that they would contact us if they needed additional spiritual support. Chaplain Gean Larose A. Male Iafrate Ext. [phone]

## 2015-09-27 NOTE — Evaluation (Signed)
Physical Therapy Evaluation Patient Details Name: Kyle JacquetMarvin D Tokar MRN: [id number] DOB: 05/23/1946 Today's Date: 09/27/2015   History of Present Illness  Patient admitted with a depressive episode secondary to Bipolar disorder. He reports feeling his legs feeling weak and heavy "for three weeks". He has been seen by PT department several times in the last month and ambulated >200 feet each occassion.   Clinical Impression  This evaluation was significantly limited given patient's disinterest in mobility and therapy session in general. Patient displays much different demeanor than previous encounters between this therapist and patient. Patient had been ambulating 300 feet as of 2 weeks ago independently with PT staff at this facility, today however he displays gross generalized weakness. Unclear etiology if this is due to demeanor/cognitive status or truly a neuromuscular impairment. Patient is able to sit at the edge of the bed independently but requires max A x 2 assistance for all transfers. Given his significant decline further evaluation of these impairments advised. Patient cannot return home in this state and would benefit from short term rehab to increase his mobility independence.     Follow Up Recommendations SNF    Equipment Recommendations  Rolling walker with 5" wheels    Recommendations for Other Services       Precautions / Restrictions Precautions Precautions: Fall Precaution Comments: Sitter in room.  Restrictions Weight Bearing Restrictions: No      Mobility  Bed Mobility Overal bed mobility: Needs Assistance;+2 for physical assistance Bed Mobility: Sit to Supine       Sit to supine: +2 for physical assistance;Max assist   General bed mobility comments: Patient provided little effort with transfer.   Transfers Overall transfer level: Needs assistance Equipment used: Rolling walker (2 wheeled) Transfers: Sit to/from Stand Sit to Stand: +2 physical  assistance;Max assist         General transfer comment: Patient displays poor sequencing, unable to find RW rails even with significant cuing.   Ambulation/Gait Ambulation/Gait assistance: Mod assist;Max assist;+2 physical assistance Ambulation Distance (Feet): 3 Feet Assistive device: Rolling walker (2 wheeled)     Gait velocity interpretation: <1.8 ft/sec, indicative of risk for recurrent falls General Gait Details: Patient has near buckling episodes in gait, very minimal step lengths and requires significant cuing for directions and foot placement.   Stairs            Wheelchair Mobility    Modified Rankin (Stroke Patients Only)       Balance Overall balance assessment: Needs assistance Sitting-balance support: Feet supported Sitting balance-Leahy Scale: Good Sitting balance - Comments: No balance deficits.    Standing balance support: Bilateral upper extremity supported Standing balance-Leahy Scale: Poor Standing balance comment: Patient requires heavy assistance with standing and ambulation secondary to LE weakness.                              Pertinent Vitals/Pain Pain Assessment:  (Does not mention pain, just leg heaviness)    Home Living Family/patient expects to be discharged to:: Private residence Living Arrangements: Alone Available Help at Discharge: Friend(s);Other (Comment) Type of Home: Mobile home Home Access: Stairs to enter Entrance Stairs-Rails:  (Unclear?) Entrance Stairs-Number of Steps: 2 Home Layout: One level   Additional Comments: All information gathered from previous encounters. Patient did not respond to PT consistently during this session.     Prior Function  Hand Dominance        Extremity/Trunk Assessment   Upper Extremity Assessment: Generalized weakness;Difficult to assess due to impaired cognition           Lower Extremity Assessment: Generalized weakness;Difficult to assess due  to impaired cognition         Communication   Communication:  (Very soft spoken, did not reply to many questions or introduction)  Cognition Arousal/Alertness: Awake/alert Behavior During Therapy: Flat affect Overall Cognitive Status: Difficult to assess                      General Comments      Exercises        Assessment/Plan    PT Assessment Patient needs continued PT services  PT Diagnosis Difficulty walking   PT Problem List Decreased activity tolerance;Decreased balance;Decreased strength;Decreased mobility;Decreased cognition;Decreased knowledge of use of DME;Decreased safety awareness  PT Treatment Interventions Gait training;DME instruction;Therapeutic activities;Therapeutic exercise;Balance training;Neuromuscular re-education   PT Goals (Current goals can be found in the Care Plan section) Acute Rehab PT Goals PT Goal Formulation: Patient unable to participate in goal setting Time For Goal Achievement: 10/11/15 Potential to Achieve Goals: Good (Based on recent history)    Frequency Min 2X/week   Barriers to discharge Decreased caregiver support Given his current status cognitively and physically patient cannot return to independent living.     Co-evaluation               End of Session Equipment Utilized During Treatment: Gait belt Activity Tolerance: Patient limited by lethargy Patient left: in bed;with bed alarm set;with nursing/sitter in room Nurse Communication: Mobility status         Time: 4098-1191 PT Time Calculation (min) (ACUTE ONLY): 13 min   Charges:   PT Evaluation $Initial PT Evaluation Tier I: 1 Procedure     PT G Codes:       Kerin Ransom, PT, DPT    09/27/2015, 12:07 PM

## 2015-09-27 NOTE — Progress Notes (Signed)
CSW will continue to follow patient for assessment of disposition needs.  PT is recommending STR at this time however he was able to ambulate 300 feet a week ago.  Psych is also following patient.  CSW will return to complete an assessment of ongoing and disposition needs.  Sammuel Hineseborah Recardo Linn. Theresia MajorsLCSWA, MSW Clinical Social Work Department [phone] 5:46 PM

## 2015-09-27 NOTE — Progress Notes (Signed)
   SUBJECTIVE: Pt sleeping comfortably with sitter at bedside.    Filed Vitals:   09/26/15 1203 09/26/15 1602 09/26/15 2048 09/27/15 0448  BP: 112/45 128/52 137/50 124/44  Pulse: 58 64 66 57  Temp: 99.1 F (37.3 C) 99.5 F (37.5 C) 97.5 F (36.4 C) 98.1 F (36.7 C)  TempSrc: Axillary Oral Oral   Resp: 23 23 18 20   Height:      Weight:      SpO2: 100% 100% 98% 98%    Intake/Output Summary (Last 24 hours) at 09/27/15 0906 Last data filed at 09/27/15 0743  Gross per 24 hour  Intake 1902.07 ml  Output   3100 ml  Net -1197.93 ml    LABS: Basic Metabolic Panel:  Recent Labs  16/08/9610/02/16 1317 09/26/15 0524 09/27/15 0624  NA 137 139 144  K 3.6 3.1* 3.7  CL 104 106 112*  CO2 23 28 28   GLUCOSE 133* 103* 91  BUN 19 13 6   CREATININE 0.89 0.79 0.66  CALCIUM 9.3 8.4* 8.6*  MG 2.5*  --   --    Liver Function Tests:  Recent Labs  09/25/15 1317  AST 51*  ALT 27  ALKPHOS 80  BILITOT 2.3*  PROT 7.5  ALBUMIN 4.1   No results for input(s): LIPASE, AMYLASE in the last 72 hours. CBC:  Recent Labs  09/25/15 1317 09/26/15 0524  WBC 24.4* 12.9*  NEUTROABS 20.1*  --   HGB 14.9 12.3*  HCT 44.0 36.3*  MCV 93.7 94.0  PLT 325 247   Cardiac Enzymes:  Recent Labs  09/25/15 1806 09/25/15 2313 09/26/15 0524  TROPONINI 0.08* <0.03 <0.03   BNP: Invalid input(s): POCBNP D-Dimer: No results for input(s): DDIMER in the last 72 hours. Hemoglobin A1C:  Recent Labs  09/26/15 0524  HGBA1C 5.4   Fasting Lipid Panel: No results for input(s): CHOL, HDL, LDLCALC, TRIG, CHOLHDL, LDLDIRECT in the last 72 hours. Thyroid Function Tests:  Recent Labs  09/25/15 1806  TSH 0.512   Anemia Panel: No results for input(s): VITAMINB12, FOLATE, FERRITIN, TIBC, IRON, RETICCTPCT in the last 72 hours.   PHYSICAL EXAM General: Well developed, well nourished, in no acute distress HEENT:  Normocephalic and atramatic Neck:  No JVD.  Lungs: Clear bilaterally to auscultation and  percussion. Heart: HRRR . Normal S1 and S2 without gallops or murmurs.  Abdomen: Bowel sounds are positive, abdomen soft and non-tender  Msk:  Back normal, normal gait. Normal strength and tone for age. Extremities: No clubbing, cyanosis or edema.    TELEMETRY: Reviewed telemetry pt in NSR, no further episodes of SVT or VT  ASSESSMENT AND PLAN:  1. NS VT: advise changing amio gtt to PO 400mg  daily. Pt will likely need NST as outpatient to evaluate cause of VT, although it is likely metabolic.    Patient and plan discussed with supervising provider, Dr. Adrian BlackwaterShaukat Khan, who agrees with above findings.   Alinda SierrasEileen A. Margarito CourserRomano, PA-C [facility]  09/27/2015 9:06 AM

## 2015-09-27 NOTE — Progress Notes (Signed)
Patient calm and cooperative at this time, does not express any suicidal ideation at this time. No complaints. Patient still has not voided since cath removal. Will bladder scan at 1800 if no output. SB on telemetry.  Trudee KusterBrandi R Mansfield

## 2015-09-28 LAB — GLUCOSE, CAPILLARY
GLUCOSE-CAPILLARY: 104 mg/dL — AB (ref 65–99)
GLUCOSE-CAPILLARY: 116 mg/dL — AB (ref 65–99)
Glucose-Capillary: 118 mg/dL — ABNORMAL HIGH (ref 65–99)
Glucose-Capillary: 99 mg/dL (ref 65–99)

## 2015-09-28 NOTE — Progress Notes (Signed)
   SUBJECTIVE: Pt sitting comfortably in chair. When asked about his heart, he states "it's broken, and I need a new one". Denies CP, SOB or palpitations. No further SVT or VT on tele.    Filed Vitals:   09/27/15 1200 09/27/15 2005 09/27/15 2147 09/28/15 0451  BP:  128/52  141/51  Pulse: 62 58 112 58  Temp:  98.8 F (37.1 C)  97.8 F (36.6 C)  TempSrc:  Oral    Resp:  18  18  Height:      Weight:      SpO2: 90% 98%  97%    Intake/Output Summary (Last 24 hours) at 09/28/15 1000 Last data filed at 09/28/15 0834  Gross per 24 hour  Intake   1960 ml  Output   4534 ml  Net  -2574 ml    LABS: Basic Metabolic Panel:  Recent Labs  95/28/4110/01/08 1317 09/26/15 0524 09/27/15 0624  NA 137 139 144  K 3.6 3.1* 3.7  CL 104 106 112*  CO2 23 28 28   GLUCOSE 133* 103* 91  BUN 19 13 6   CREATININE 0.89 0.79 0.66  CALCIUM 9.3 8.4* 8.6*  MG 2.5*  --   --    Liver Function Tests:  Recent Labs  09/25/15 1317  AST 51*  ALT 27  ALKPHOS 80  BILITOT 2.3*  PROT 7.5  ALBUMIN 4.1   No results for input(s): LIPASE, AMYLASE in the last 72 hours. CBC:  Recent Labs  09/25/15 1317 09/26/15 0524  WBC 24.4* 12.9*  NEUTROABS 20.1*  --   HGB 14.9 12.3*  HCT 44.0 36.3*  MCV 93.7 94.0  PLT 325 247   Cardiac Enzymes:  Recent Labs  09/25/15 1806 09/25/15 2313 09/26/15 0524  TROPONINI 0.08* <0.03 <0.03   BNP: Invalid input(s): POCBNP D-Dimer: No results for input(s): DDIMER in the last 72 hours. Hemoglobin A1C:  Recent Labs  09/26/15 0524  HGBA1C 5.4   Fasting Lipid Panel: No results for input(s): CHOL, HDL, LDLCALC, TRIG, CHOLHDL, LDLDIRECT in the last 72 hours. Thyroid Function Tests:  Recent Labs  09/25/15 1806  TSH 0.512   Anemia Panel: No results for input(s): VITAMINB12, FOLATE, FERRITIN, TIBC, IRON, RETICCTPCT in the last 72 hours.   PHYSICAL EXAM General: Well developed, well nourished, in no acute distress HEENT: Normocephalic and atramatic Neck: No  JVD.  Lungs: Clear bilaterally to auscultation and percussion. Heart: HRRR . Normal S1 and S2 without gallops or murmurs.  Abdomen: Bowel sounds are positive, abdomen soft and non-tender  Msk: Back normal, normal gait. Normal strength and tone for age. Extremities: No clubbing, cyanosis or edema.   TELEMETRY: Reviewed telemetry pt in NSR, no further episodes of SVT or VT  ASSESSMENT AND PLAN:  1. NS VT: Continue amiodarone PO 400mg  daily. Pt will likely need NST as outpatient to evaluate cause of VT, although it is likely metabolic. Pt given outpatient f/u 11/22 at 2pm.    Patient and plan discussed with supervising provider, Dr. Adrian BlackwaterShaukat Khan, who agrees with above findings.   Alinda SierrasEileen A. Margarito CourserRomano, PA-C [facility]  09/28/2015 10:00 AM

## 2015-09-28 NOTE — Progress Notes (Signed)
[facility] [place] at [facility]   PATIENT NAME: Kyle [name]    MR#:  [id number]  DATE OF BIRTH:  1946-10-23  SUBJECTIVE:  Patient sitting in chair not want to talk appears depressed   REVIEW OF SYSTEMS:    Review of Systems  Constitutional: Negative for fever, chills and malaise/fatigue.  HENT: Negative for sore throat.   Eyes: Negative for blurred vision.  Respiratory: Negative for cough, hemoptysis, shortness of breath and wheezing.   Cardiovascular: Negative for chest pain, palpitations and leg swelling.  Gastrointestinal: Negative for nausea, vomiting, abdominal pain, diarrhea and blood in stool.  Genitourinary: Negative for dysuria.  Musculoskeletal: Negative for back pain.  Neurological: Negative for dizziness, tremors and headaches.  Endo/Heme/Allergies: Does not bruise/bleed easily.  Psychiatric/Behavioral: Positive for depression. Negative for suicidal ideas.    Tolerating Diet: Yes      DRUG ALLERGIES:  No Known Allergies  VITALS:  Blood pressure 150/62, pulse 63, temperature 98.6 F (37 C), temperature source Oral, resp. rate 18, height  (1.727 m), weight 99.791 kg (220 lb), SpO2 99 %.  PHYSICAL EXAMINATION:   Physical Exam  Constitutional: [name], place, and time and well-developed, well-nourished, and in no distress. No distress.  HENT:  Head: Normocephalic.  Eyes: No scleral icterus.  Neck: Normal range of motion. Neck supple. No JVD present. No tracheal deviation present.  Cardiovascular: Normal rate, regular rhythm and normal heart sounds.  Exam reveals no gallop and no friction rub.   No murmur heard. Pulmonary/Chest: Effort normal and breath sounds normal. No respiratory distress. He has no wheezes. He has no rales. He exhibits no tenderness.  Abdominal: Soft. Bowel sounds are normal. He exhibits no distension and no mass. There is no tenderness. There is no rebound and no guarding.   Musculoskeletal: Normal range of motion. He exhibits no edema.  Neurological: He is alert and oriented to person, place, and time.  Skin: Skin is warm. No rash noted. No erythema.  Psychiatric:  depressed      LABORATORY PANEL:   CBC  Recent Labs Lab 09/26/15 0524  WBC 12.9*  HGB 12.3*  HCT 36.3*  PLT 247   ------------------------------------------------------------------------------------------------------------------  Chemistries   Recent Labs Lab 09/25/15 1317  09/27/15 0624  NA 137  < > 144  K 3.6  < > 3.7  CL 104  < > 112*  CO2 23  < > 28  GLUCOSE 133*  < > 91  BUN 19  < > 6  CREATININE 0.89  < > 0.66  CALCIUM 9.3  < > 8.6*  MG 2.5*  --   --   AST 51*  --   --   ALT 27  --   --   ALKPHOS 80  --   --   BILITOT 2.3*  --   --   < > = values in this interval not displayed. ------------------------------------------------------------------------------------------------------------------  Cardiac Enzymes  Recent Labs Lab 09/25/15 1806 09/25/15 2313 09/26/15 0524  TROPONINI 0.08* <0.03 <0.03   ------------------------------------------------------------------------------------------------------------------  RADIOLOGY:  No results found.   ASSESSMENT AND PLAN:   69 year old male who presented with suicidal ideation and found to have SVT with nonsustained V. tach.   1. Nonsustained V. tach after 2 episodes of SVT: Patient is currently in normal sinus rhythm. This may be due to metabolic etiology. His troponin on admission was very slightly elevated however next 2 are normal. Appreciate cardiology consultation. Patient has been transitioned from  IV amiodarone to POamiodarone.  2. Suicidal ideation: Patient needs ongoing psychiatric evaluation for his extreme depression and suicidal ideation. Physical therapy is recommending skilled nursing facility and therefore patient cannot be transferred to behavioral health.  Continue sitter  3. Hyperlipidemia:  Continue Zocor  4. Essential hypertension: Continue metoprolol  5. Depression/psych issues: Continue Effexor, Zyprexa and lithium. Lithium level was within normal limits.  6. Diabetes: Continue metformin and sliding scale insulin  7. Urinary retention: Flomax started. Try voiding trial to see if patient is able to urinate on his own.   CODE STATUS: FULL  TOTAL TIME TAKING CARE OF THIS PATIENT: 20 minutes.  Physical therapy is recommended skilled nursing facility, however patient needs inpatient psychiatric care.  Patient is medically stable for discharge whenever bed available in psychiatry unit and meets criteria for inpatient psychiatry unit.   Marland Kitchen.   Luann Aspinwall M.D on 09/28/2015 at 12:00 PM  Between 7am to 6pm - Pager - [phone] After 6pm go to [email or web address] - password [email or web address] [facility]  [facility]  Office  [phone]  CC: Primary care physician; No PCP Per Patient  Note: This dictation was prepared with Dragon dictation along with smaller phrase technology. Any transcriptional errors that result from this process are unintentional.

## 2015-09-28 NOTE — Progress Notes (Signed)
Foley removed at this time per order. Trial voided started, sitter updated and instructed to let RN know when patient voids. Patient sat up in chair at this time. Patient still weak, but showing improvement from yesterday, still requires assistance from staff, gait belt, and walker to stand. Patient more alert and verbal today, oriented x4. Appetite increased, will cont to assess.  Trudee KusterBrandi R Mansfield

## 2015-09-28 NOTE — Clinical Social Work Note (Signed)
Clinical Social Work Assessment  Patient Details  Name: Kyle JacquetMarvin D Mucci MRN: [id number] Date of Birth: 1946-06-01  Date of referral:  09/28/15               Reason for consult:  Facility Placement, Suicide Risk/Attempt                Permission sought to share information with:  Family Supports Permission granted to share information::  Yes, Verbal Permission Granted  Name::      (Sister Clide CliffMargaret Williams [phone]  or [phone])  Agency::     Relationship::     Contact Information:     Housing/Transportation Living arrangements for the past 2 months:  Mobile Home Source of Information:  Patient, Medical Team, Psychiatric Consultation Patient Interpreter Needed:  None Criminal Activity/Legal Involvement Pertinent to Current Situation/Hospitalization:   (none reported) Significant Relationships:  Adult Children, Siblings Lives with:  Self Do you feel safe going back to the place where you live?    Need for family participation in patient care:  Yes (Comment) (patient wants his sister involved)  Care giving concerns:  Patient unable to walk at this time.     Social Worker assessment / plan:  Visual merchandiserClinical Social Worker (CSW) consult for SNF placement, PT is recommending STR to assist with getting patient back to previous level of functioning.   Patient was minimally engaged in conversation with CSW.   CSW introduced self and explained role of CSW department  Patient currently lives alone in a trailer.  He is separated from his wife who recently passed away this week.  Patient's support system is his sister, he also has three children.   Patient seen by psych MD with recommendation for inpatient treatment however patient is unable to walk so he cannot go to the behavioral healht unit.   CSW did not explained that PT is recommending rehab. Due to patient;s current state.   Patient remains under IVC with a sitter in his room.  Patient presented as flat and depressed. "I feel lousy".  Patient states he uses a walker at home and ok for CSW to talk with his sister if needed.  Per patient's sitter he is not eating very much and unable to void.   CSW will continue to follow patient to assist with the disposition to SNF or to inpatient psych.  Employment status:  Disabled (Comment on whether or not currently receiving Disability) Insurance information:  Managed Medicare PT Recommendations:  Skilled Nursing Facility Information / Referral to community resources:  Inpatient Psychiatric Care (Comment Required) (recommendation from psych MD)  Patient/Family's Response to care:  Patient provided limited response, but thanked CSW for coming in.   Patient/Family's Understanding of and Emotional Response to Diagnosis, Current Treatment, and Prognosis:  Patient is lethargic will follow up with patient understanding.   Emotional Assessment Appearance:  Appears stated age Attitude/Demeanor/Rapport:  Lethargic Affect (typically observed):  Depressed, Flat, Quiet Orientation:  Oriented to Self, Oriented to Place, Oriented to Situation Alcohol / Substance use:    Psych involvement (Current and /or in the community):  Yes (Comment)  Discharge Needs  Concerns to be addressed:  Discharge Planning Concerns, Coping/Stress Concerns, Mental Health Concerns Readmission within the last 30 days:  Yes Current discharge risk:  Chronically ill, Psychiatric Illness, Lives alone, Dependent with Mobility Barriers to Discharge:  Continued Medical Work up, Requiring sitter/restraints   Soundra PilonMoore, Ephram Kornegay H, LCSW 09/28/2015, 5:24 PM

## 2015-09-28 NOTE — Progress Notes (Signed)
Notified Dr. Juliene PinaMody that patient still has not voided since this morning when foley removed. Bladder scan showed 440 mL in bladder. Ordered for in and out cath q8hr. Trudee KusterBrandi R Mansfield

## 2015-09-28 NOTE — Progress Notes (Signed)
In and out cath done, patient tolerated well. Denied pain. No acute distress noted. Sitter at bedside.

## 2015-09-29 LAB — GLUCOSE, CAPILLARY
GLUCOSE-CAPILLARY: 132 mg/dL — AB (ref 65–99)
GLUCOSE-CAPILLARY: 92 mg/dL (ref 65–99)
Glucose-Capillary: 93 mg/dL (ref 65–99)

## 2015-09-29 MED ORDER — BISACODYL 10 MG RE SUPP
10.0000 mg | Freq: Every day | RECTAL | Status: DC
  Administered 2015-09-29 – 2015-10-01 (×3): 10 mg via RECTAL
  Filled 2015-09-29 (×4): qty 1

## 2015-09-29 MED ORDER — POLYETHYLENE GLYCOL 3350 17 G PO PACK
17.0000 g | PACK | Freq: Every day | ORAL | Status: DC
  Administered 2015-09-29 – 2015-09-30 (×2): 17 g via ORAL
  Filled 2015-09-29 (×3): qty 1

## 2015-09-29 NOTE — Progress Notes (Signed)
Foley reinserted at this time per order. Spoke with Dr. Juliene PinaMody about 3 in and out caths having to be done. Patient tolerated well, urine return noted. No complaints at this time.  Trudee KusterBrandi R Mansfield

## 2015-09-29 NOTE — Progress Notes (Signed)
[facility] [place] [facility]   PATIENT NAME: Kyle [name]    MR#:  [id number]  DATE OF BIRTH:  [birth year]  SUBJECTIVE:  Patient is lying in his bed without any acute issues.   REVIEW OF SYSTEMS:    Review of Systems  Constitutional: Negative for fever, chills and malaise/fatigue.  HENT: Negative for sore throat.   Eyes: Negative for blurred vision.  Respiratory: Negative for cough, hemoptysis, shortness of breath and wheezing.   Cardiovascular: Negative for chest pain, palpitations and leg swelling.  Gastrointestinal: Negative for nausea, vomiting, abdominal pain, diarrhea and blood in stool.  Genitourinary: Negative for dysuria.  Musculoskeletal: Negative for back pain.  Neurological: Negative for dizziness, tremors and headaches.  Endo/Heme/Allergies: Does not bruise/bleed easily.  Psychiatric/Behavioral: Positive for depression. Negative for suicidal ideas.    Tolerating Diet: Yes      DRUG ALLERGIES:  No Known Allergies  VITALS:  Blood pressure 138/60, pulse 73, temperature 98.8 F (37.1 C), temperature source Oral, resp. rate 18, height  (1.727 m), weight 99.791 kg (220 lb), SpO2 95 %.  PHYSICAL EXAMINATION:   Physical Exam  Constitutional: [name], place, and time and well-developed, well-nourished, and in no distress. No distress.  HENT:  Head: Normocephalic.  Eyes: No scleral icterus.  Neck: Normal range of motion. Neck supple. No JVD present. No tracheal deviation present.  Cardiovascular: Normal rate, regular rhythm and normal heart sounds.  Exam reveals no gallop and no friction rub.   No murmur heard. Pulmonary/Chest: Effort normal and breath sounds normal. No respiratory distress. He has no wheezes. He has no rales. He exhibits no tenderness.  Abdominal: Soft. Bowel sounds are normal. He exhibits no distension and no mass. There is no tenderness. There is no rebound and no guarding.   Musculoskeletal: Normal range of motion. He exhibits no edema.  Neurological: He is alert and oriented to person, place, and time.  Skin: Skin is warm. No rash noted. No erythema.  Psychiatric:  depressed      LABORATORY PANEL:   CBC  Recent Labs Lab 09/26/15 0524  WBC 12.9*  HGB 12.3*  HCT 36.3*  PLT 247   ------------------------------------------------------------------------------------------------------------------  Chemistries   Recent Labs Lab 09/25/15 1317  09/27/15 0624  NA 137  < > 144  K 3.6  < > 3.7  CL 104  < > 112*  CO2 23  < > 28  GLUCOSE 133*  < > 91  BUN 19  < > 6  CREATININE 0.89  < > 0.66  CALCIUM 9.3  < > 8.6*  MG 2.5*  --   --   AST 51*  --   --   ALT 27  --   --   ALKPHOS 80  --   --   BILITOT 2.3*  --   --   < > = values in this interval not displayed. ------------------------------------------------------------------------------------------------------------------  Cardiac Enzymes  Recent Labs Lab 09/25/15 1806 09/25/15 2313 09/26/15 0524  TROPONINI 0.08* <0.03 <0.03   ------------------------------------------------------------------------------------------------------------------  RADIOLOGY:  No results found.   ASSESSMENT AND PLAN:   69 year old male who presented with suicidal ideation and found to have SVT with nonsustained V. tach.   1. Nonsustained V. tach after 2 episodes of SVT: Patient is currently in normal sinus rhythm. This may be due to metabolic etiology. His troponin on admission was very slightly elevated however next 2 are normal. Appreciate cardiology consultation. Patient has been transitioned from  IV amiodarone to PO amiodarone. He has a follow-up appointment with cardiology on November 22 at 2 PM. He will need outpatient stress test.  2. Suicidal ideation: Patient needs ongoing psychiatric evaluation for his extreme depression and suicidal ideation. Physical therapy is recommending skilled nursing  facility and therefore patient cannot be transferred to behavioral health.  Continue sitter  3. Hyperlipidemia: Continue Zocor  4. Essential hypertension: Continue metoprolol  5. Depression/psych issues: Continue Effexor, Zyprexa and lithium. Lithium level was within normal limits.  6. Diabetes: Continue metformin and sliding scale insulin  7. Urinary retention: Flomax started. Patient still has large amount of residual and therefore Foley was replaced.   CODE STATUS: FULL  TOTAL TIME TAKING CARE OF THIS PATIENT: 20 minutes.  Physical therapy is recommended skilled nursing facility, however patient needs inpatient psychiatric care, but is too weak to go to inpatient psychiatric unit. His psychiatric needs need to be managed while on the medicine floor and once stable by psychiatry for discharge she will need skilled nursing facility.  Patient is medically stable for discharge but not psychiatrically. Marland Kitchen.   Elbridge Magowan M.D on 09/29/2015 at 11:55 AM  Between 7am to 6pm - Pager - [phone] After 6pm go to [email or web address] - password [email or web address] [facility]  [facility]  Office  [phone]  CC: Primary care physician; No PCP Per Patient  Note: This dictation was prepared with Dragon dictation along with smaller phrase technology. Any transcriptional errors that result from this process are unintentional.

## 2015-09-29 NOTE — Progress Notes (Signed)
   SUBJECTIVE: Pt is in bed this morning, speech barely coherent. But does deny CP or SOB.    Filed Vitals:   09/28/15 1055 09/28/15 2047 09/29/15 0644 09/29/15 1019  BP: 150/62 131/50 132/57 138/60  Pulse: 63 60 71 73  Temp: 98.6 F (37 C) 98.4 F (36.9 C) 98.8 F (37.1 C)   TempSrc: Oral Oral Oral   Resp:  18 18   Height:      Weight:      SpO2: 99% 95% 95%     Intake/Output Summary (Last 24 hours) at 09/29/15 1111 Last data filed at 09/29/15 0700  Gross per 24 hour  Intake    705 ml  Output   2940 ml  Net  -2235 ml    LABS: Basic Metabolic Panel:  Recent Labs  14/78/2910/03/08 0624  NA 144  K 3.7  CL 112*  CO2 28  GLUCOSE 91  BUN 6  CREATININE 0.66  CALCIUM 8.6*   Liver Function Tests: No results for input(s): AST, ALT, ALKPHOS, BILITOT, PROT, ALBUMIN in the last 72 hours. No results for input(s): LIPASE, AMYLASE in the last 72 hours. CBC: No results for input(s): WBC, NEUTROABS, HGB, HCT, MCV, PLT in the last 72 hours. Cardiac Enzymes: No results for input(s): CKTOTAL, CKMB, CKMBINDEX, TROPONINI in the last 72 hours. BNP: Invalid input(s): POCBNP D-Dimer: No results for input(s): DDIMER in the last 72 hours. Hemoglobin A1C: No results for input(s): HGBA1C in the last 72 hours. Fasting Lipid Panel: No results for input(s): CHOL, HDL, LDLCALC, TRIG, CHOLHDL, LDLDIRECT in the last 72 hours. Thyroid Function Tests: No results for input(s): TSH, T4TOTAL, T3FREE, THYROIDAB in the last 72 hours.  Invalid input(s): FREET3 Anemia Panel: No results for input(s): VITAMINB12, FOLATE, FERRITIN, TIBC, IRON, RETICCTPCT in the last 72 hours.   PHYSICAL EXAM General: Well developed, well nourished, in no acute distress HEENT: Normocephalic and atramatic Neck: No JVD.  Lungs: Clear bilaterally to auscultation and percussion. Heart: HRRR . Normal S1 and S2 without gallops or murmurs.  Abdomen: Bowel sounds are positive, abdomen soft and non-tender  Extremities:  No clubbing, cyanosis or edema.   TELEMETRY: Reviewed telemetry pt in NSR, no further episodes of SVT or VT  ASSESSMENT AND PLAN:  1. NS VT: Continue amiodarone PO 400mg  daily. Pt will likely need NST as outpatient to evaluate cause of VT, although it is likely metabolic. Pt given outpatient f/u 11/22 at 2pm.    Patient and plan discussed with supervising provider, Dr. Adrian BlackwaterShaukat Khan, who agrees with above findings.   Alinda SierrasEileen A. Margarito CourserRomano, PA-C [facility]  09/29/2015 11:11 AM

## 2015-09-29 NOTE — Plan of Care (Signed)
Problem: Spiritual Needs Goal: Ability to function at adequate level Outcome: Not Progressing Patient is weak and depend on staff for ADL.

## 2015-09-30 LAB — CULTURE, BLOOD (ROUTINE X 2)
CULTURE: NO GROWTH
Culture: NO GROWTH

## 2015-09-30 LAB — AMMONIA: AMMONIA: 21 umol/L (ref 9–35)

## 2015-09-30 LAB — GLUCOSE, CAPILLARY
Glucose-Capillary: 113 mg/dL — ABNORMAL HIGH (ref 65–99)
Glucose-Capillary: 169 mg/dL — ABNORMAL HIGH (ref 65–99)
Glucose-Capillary: 154 mg/dL — ABNORMAL HIGH (ref 65–99)
Glucose-Capillary: 139 mg/dL — ABNORMAL HIGH (ref 65–99)

## 2015-09-30 LAB — LITHIUM LEVEL: LITHIUM LVL: 0.5 mmol/L — AB (ref 0.60–1.20)

## 2015-09-30 LAB — CREATININE, SERUM: Creatinine, Ser: 0.66 mg/dL (ref 0.61–1.24)

## 2015-09-30 LAB — TSH: TSH: 1.906 u[IU]/mL (ref 0.350–4.500)

## 2015-09-30 MED ORDER — LITHIUM CARBONATE ER 300 MG PO TBCR
900.0000 mg | EXTENDED_RELEASE_TABLET | Freq: Every day | ORAL | Status: DC
  Administered 2015-09-30 – 2015-10-08 (×9): 900 mg via ORAL
  Filled 2015-09-30 (×9): qty 3

## 2015-09-30 NOTE — Care Management (Signed)
Patient remains under IVC and does not engage with CM.  Does not respond verbally and does not make eye contact.  He keeps his eyes closed most of the time.  Physical therapy recommends skilled nursing.  wonder if this patient would be more appropriate for geri psych unit.  He is severely depressed and had suicide ideations.  His cardiac status is stable.    Entered CSW referral

## 2015-09-30 NOTE — Progress Notes (Signed)
   SUBJECTIVE: Pt in bed, states he feels terrible. But denies CP or SOB.    Filed Vitals:   09/29/15 1019 09/29/15 1159 09/29/15 1954 09/30/15 0429  BP: 138/60 125/93 137/48 142/49  Pulse: 73 76 66 65  Temp:  98.6 F (37 C) 98 F (36.7 C) 99.1 F (37.3 C)  TempSrc:  Oral Oral Oral  Resp:   20 18  Height:      Weight:      SpO2:  95% 93% 94%    Intake/Output Summary (Last 24 hours) at 09/30/15 0856 Last data filed at 09/30/15 0617  Gross per 24 hour  Intake    240 ml  Output   3450 ml  Net  -3210 ml    LABS: Basic Metabolic Panel:  Recent Labs  09/81/1910/06/07 0235  CREATININE 0.66   Liver Function Tests: No results for input(s): AST, ALT, ALKPHOS, BILITOT, PROT, ALBUMIN in the last 72 hours. No results for input(s): LIPASE, AMYLASE in the last 72 hours. CBC: No results for input(s): WBC, NEUTROABS, HGB, HCT, MCV, PLT in the last 72 hours. Cardiac Enzymes: No results for input(s): CKTOTAL, CKMB, CKMBINDEX, TROPONINI in the last 72 hours. BNP: Invalid input(s): POCBNP D-Dimer: No results for input(s): DDIMER in the last 72 hours. Hemoglobin A1C: No results for input(s): HGBA1C in the last 72 hours. Fasting Lipid Panel: No results for input(s): CHOL, HDL, LDLCALC, TRIG, CHOLHDL, LDLDIRECT in the last 72 hours. Thyroid Function Tests: No results for input(s): TSH, T4TOTAL, T3FREE, THYROIDAB in the last 72 hours.  Invalid input(s): FREET3 Anemia Panel: No results for input(s): VITAMINB12, FOLATE, FERRITIN, TIBC, IRON, RETICCTPCT in the last 72 hours.   PHYSICAL EXAM General: Well developed, well nourished, in no acute distress HEENT: Normocephalic and atramatic Neck: No JVD.  Lungs: Clear bilaterally to auscultation and percussion. Heart: HRRR . Normal S1 and S2 without gallops or murmurs.  Abdomen: Bowel sounds are positive, abdomen soft and non-tender  Extremities: No clubbing, cyanosis or edema.   TELEMETRY: Reviewed telemetry pt in NSR, no further  episodes of SVT or VT  ASSESSMENT AND PLAN:  1. NS VT: Continue amiodarone PO 400mg  daily. Pt will likely need NST as outpatient to evaluate cause of VT, although it is likely metabolic. Pt given outpatient f/u 11/22 at 2pm.   Patient and plan discussed with supervising provider, Dr. Adrian BlackwaterShaukat Khan, who agrees with above findings.   Alinda SierrasEileen A. Margarito CourserRomano, PA-C [facility]  09/30/2015 8:56 AM

## 2015-09-30 NOTE — Progress Notes (Signed)
Report called to Dava, RN on 1A

## 2015-09-30 NOTE — Progress Notes (Signed)
Pt lying in bed, very flat affect, hard to get verbal interaction with pt.  Opens eyes minimally, states "dont feel good", pt will not elaborate on this.  Sitter at bedside.  Pt denies need at this time.  CB in reach, SR up x 2.

## 2015-09-30 NOTE — Progress Notes (Signed)
Spoke with Dr. Juliene PinaMody, orders received to discontinue cardiac monitor

## 2015-09-30 NOTE — Progress Notes (Signed)
Pt received earlier from 2A.  IV in left hand swollen-discontinued/removed

## 2015-09-30 NOTE — Progress Notes (Signed)
Physical Therapy Treatment Patient Details Name: Kyle [name] MRN: [id number] DOB: 02-Nov-1946 Today's Date: 09/30/2015    History of Present Illness Patient admitted with a depressive episode secondary to Bipolar disorder. He reports feeling his legs feeling weak and heavy "for three weeks". He has been seen by PT department several times in the last month and ambulated >200 feet each occassion.     PT Comments    Patient continues to demonstrate significant weakness compared to recent weeks and noted to have tremors in UEs with finger-nose-finger testing. Patient would not relax his LEs to allow testing for clonus or Babinski. Patient noted to have improved sit to stand performance today, however poor sitting balance noted as he "hurt too much to sit up". Patient with very withdrawn affect and provides less than optimal effort throughout this session.   Follow Up Recommendations  SNF     Equipment Recommendations  Rolling walker with 5" wheels    Recommendations for Other Services       Precautions / Restrictions Precautions Precautions: Fall Precaution Comments: Sitter in room.  Restrictions Weight Bearing Restrictions: No    Mobility  Bed Mobility Overal bed mobility: Needs Assistance Bed Mobility: Sit to Supine;Supine to Sit     Supine to sit: Max assist Sit to supine: Min assist;+2 for physical assistance;Mod assist   General bed mobility comments: Patient provided little effort with supine to sit transfer, he did however provide improved performance with sit to supine as he wanted to lay down throughout session.   Transfers Overall transfer level: Needs assistance Equipment used: Rolling walker (2 wheeled) Transfers: Sit to/from Stand Sit to Stand: +2 physical assistance;Mod assist;Min assist         General transfer comment: patient continues to display poor technique and hand placement, improved effort through LEs during transfer.   Ambulation/Gait              General Gait Details: Patient declined any ambulation   Stairs            Wheelchair Mobility    Modified Rankin (Stroke Patients Only)       Balance Overall balance assessment: Needs assistance Sitting-balance support: Feet supported Sitting balance-Leahy Scale: Fair Sitting balance - Comments: Patient leaned left throughout this session, seemingly because he did not want to participate and wanted to return to bed.  Postural control: Left lateral lean Standing balance support: Bilateral upper extremity supported Standing balance-Leahy Scale: Poor Standing balance comment: No buckling noted during standing, maintained flexed neck.                     Cognition Arousal/Alertness: Lethargic Behavior During Therapy: Flat affect Overall Cognitive Status: Difficult to assess                      Exercises Other Exercises Other Exercises: Finger nose finger assessed, noted intention tremors in UEs.     General Comments        Pertinent Vitals/Pain Pain Assessment:  (Patient reports he is in pain everywhere while sitting up.)    Home Living                      Prior Function            PT Goals (current goals can now be found in the care plan section) Acute Rehab PT Goals PT Goal Formulation: Patient unable to participate in goal setting Time For Goal Achievement:  10/11/15 Potential to Achieve Goals: Fair Progress towards PT goals: Not progressing toward goals - comment (Patient continues to self limit and provide little effort)    Frequency  Min 2X/week    PT Plan Current plan remains appropriate    Co-evaluation             End of Session Equipment Utilized During Treatment: Gait belt Activity Tolerance: Patient limited by lethargy Patient left: in bed;with bed alarm set;with nursing/sitter in room     Time: 1610-9604 PT Time Calculation (min) (ACUTE ONLY): 20 min  Charges:  $Therapeutic Activity: 8-22  mins                     G Codes:      Kerin Ransom, PT, DPT    09/30/2015, 12:45 PM
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
## 2015-09-30 NOTE — Plan of Care (Signed)
Problem: Education: Goal: Knowledge of [facility] Education information/materials will improve Outcome: Not Progressing Pt continues to be lethargic

## 2015-09-30 NOTE — Plan of Care (Signed)
Problem: Spiritual Needs Goal: Ability to function at adequate level Outcome: Not Progressing Pt remains weak at this time, Physical therapy involved with pt.

## 2015-09-30 NOTE — Progress Notes (Addendum)
[facility] - [place] at [facility]   PATIENT NAME: Kyle [name]    MR#:  [id number]  DATE OF BIRTH:  February 08, 1946  SUBJECTIVE:  Patient is sitting in his bed. He opens his eyes but is not really talking. Sitter reports that he did eat his breakfast this morning. His hospitalization here has been unchanged as far as his affect.  REVIEW OF SYSTEMS:    Review of Systems  Unable to perform ROS   Tolerating Diet: Yes      DRUG ALLERGIES:  No Known Allergies  VITALS:  Blood pressure 124/52, pulse 67, temperature 99.1 F (37.3 C), temperature source Oral, resp. rate 20, height  (1.727 m), weight 99.791 kg (220 lb), SpO2 95 %.  PHYSICAL EXAMINATION:   Physical Exam  Constitutional: He is well-developed, well-nourished, and in no distress. No distress.  HENT:  Head: Normocephalic.  Eyes: No scleral icterus.  Neck: No JVD present. No tracheal deviation present.  Cardiovascular: Normal rate, regular rhythm and normal heart sounds.  Exam reveals no gallop and no friction rub.   No murmur heard. Pulmonary/Chest: Effort normal and breath sounds normal. No respiratory distress. He has no wheezes. He has no rales. He exhibits no tenderness.  Abdominal: Soft. Bowel sounds are normal. He exhibits no distension and no mass. There is no tenderness. There is no rebound and no guarding.  Musculoskeletal: Normal range of motion. He exhibits no edema.  Neurological:  Lethargic depressed  Skin: Skin is warm. No rash noted. No erythema.  Psychiatric:  depressed      LABORATORY PANEL:   CBC  Recent Labs Lab 09/26/15 0524  WBC 12.9*  HGB 12.3*  HCT 36.3*  PLT 247   ------------------------------------------------------------------------------------------------------------------  Chemistries   Recent Labs Lab 09/25/15 1317  09/27/15 0624 09/30/15 0235  NA 137  < > 144  --   K 3.6  < > 3.7  --   CL 104  < > 112*  --   CO2 23  < > 28  --    GLUCOSE 133*  < > 91  --   BUN 19  < > 6  --   CREATININE 0.89  < > 0.66 0.66  CALCIUM 9.3  < > 8.6*  --   MG 2.5*  --   --   --   AST 51*  --   --   --   ALT 27  --   --   --   ALKPHOS 80  --   --   --   BILITOT 2.3*  --   --   --   < > = values in this interval not displayed. ------------------------------------------------------------------------------------------------------------------  Cardiac Enzymes  Recent Labs Lab 09/25/15 1806 09/25/15 2313 09/26/15 0524  TROPONINI 0.08* <0.03 <0.03   ------------------------------------------------------------------------------------------------------------------  RADIOLOGY:  No results found.   ASSESSMENT AND PLAN:   69 year old male who presented with suicidal ideation and found to have SVT with nonsustained V. tach.   1. Nonsustained V. tach after 2 episodes of SVT: Patient is currently in normal sinus rhythm. This may be due to metabolic etiology. His troponin on admission was very slightly elevated however next 2 are normal. Appreciate cardiology consultation. Patient has been transitioned from IV amiodarone to PO amiodarone. He has a follow-up appointment with cardiology on November 22 at 2 PM. He will need outpatient stress test.  2. Suicidal ideation: [facility] and suicidal ideation. Physical therapy  is recommending skilled nursing facility and therefore patient cannot be transferred to behavioral health.  Corporate investment bankerContinue sitter. Follow-up on psychiatry evaluation today.  3. Hyperlipidemia: Continue Zocor  4. Essential hypertension: Continue metoprolol  5. Depression/psych issues: Continue Effexor, Zyprexa and lithium. Lithium level was within normal limits. I'm going to recheck B12, TSH and ammonia level. 6. Diabetes: Continue metformin and sliding scale insulin  7. Urinary retention: Flomax started. Patient still has large amount of residual and therefore Foley  was replaced.   CODE STATUS: FULL  TOTAL TIME TAKING CARE OF THIS PATIENT: 20 minutes.  Physical therapy is recommended skilled nursing facility, however patient needs inpatient psychiatric care, but is too weak to go to inpatient psychiatric unit. His psychiatric needs need to be managed while on the medicine floor and once stable by psychiatry for discharge she will need skilled nursing facility.  Patient is medically stable for discharge but not psychiatrically. Okay to discontinue telemetry monitoring. Marland Kitchen.   Mead Slane M.D on 09/30/2015 at 12:32 PM  Between 7am to 6pm - Pager - [phone] After 6pm go to [email or web address] - password [email or web address] [facility]  [facility]  Office  [phone]  CC: Primary care physician; No PCP Per Patient  Note: This dictation was prepared with Dragon dictation along with smaller phrase technology. Any transcriptional errors that result from this process are unintentional.

## 2015-10-01 LAB — BASIC METABOLIC PANEL WITH GFR
Anion gap: 5 (ref 5–15)
BUN: 8 mg/dL (ref 6–20)
CO2: 29 mmol/L (ref 22–32)
Calcium: 8.9 mg/dL (ref 8.9–10.3)
Chloride: 102 mmol/L (ref 101–111)
Creatinine, Ser: 0.66 mg/dL (ref 0.61–1.24)
GFR calc Af Amer: >60 mL/min
GFR calc non Af Amer: >60 mL/min
Glucose, Bld: 98 mg/dL (ref 65–99)
Potassium: 4.3 mmol/L (ref 3.5–5.1)
Sodium: 136 mmol/L (ref 135–145)

## 2015-10-01 LAB — GLUCOSE, CAPILLARY
GLUCOSE-CAPILLARY: 108 mg/dL — AB (ref 65–99)
GLUCOSE-CAPILLARY: 124 mg/dL — AB (ref 65–99)
Glucose-Capillary: 95 mg/dL (ref 65–99)
Glucose-Capillary: 119 mg/dL — ABNORMAL HIGH (ref 65–99)
Glucose-Capillary: 92 mg/dL (ref 65–99)

## 2015-10-01 LAB — VITAMIN B12: Vitamin B-12: 388 pg/mL (ref 180–914)

## 2015-10-01 MED ORDER — BISACODYL 10 MG RE SUPP
10.0000 mg | Freq: Every day | RECTAL | Status: DC | PRN
  Administered 2015-10-07 – 2015-10-10 (×2): 10 mg via RECTAL
  Filled 2015-10-01 (×2): qty 1

## 2015-10-01 MED ORDER — POLYETHYLENE GLYCOL 3350 17 G PO PACK
17.0000 g | PACK | Freq: Every day | ORAL | Status: DC | PRN
  Administered 2015-10-05 – 2015-10-10 (×5): 17 g via ORAL
  Filled 2015-10-01 (×6): qty 1

## 2015-10-01 NOTE — Progress Notes (Signed)
[facility] [place] [facility]   PATIENT NAME: Kyle LennoxMarvin [name]    MR#:  [id number]  DATE OF BIRTH:  1945/12/28  SUBJECTIVE:  Patient lying in chair. Sitter at bedside c/o hurting all over denies SI REVIEW OF SYSTEMS:    Review of Systems  Constitutional: Positive for malaise/fatigue. Negative for fever and chills.  HENT: Negative for sore throat.   Eyes: Negative for blurred vision and pain.  Respiratory: Negative for cough, hemoptysis, shortness of breath and wheezing.   Cardiovascular: Negative for chest pain, palpitations and leg swelling.  Gastrointestinal: Negative for nausea, vomiting, abdominal pain, diarrhea and blood in stool.  Genitourinary: Negative for dysuria.  Musculoskeletal: Positive for myalgias, back pain, joint pain and neck pain.  Neurological: Positive for weakness. Negative for dizziness, tingling, tremors, sensory change, speech change and headaches.  Endo/Heme/Allergies: Does not bruise/bleed easily.  Psychiatric/Behavioral: Positive for depression. Negative for suicidal ideas and hallucinations. The patient is not nervous/anxious and does not have insomnia.     Tolerating Diet: Yes      DRUG ALLERGIES:  No Known Allergies  VITALS:  Blood pressure 132/57, pulse 53, temperature 97.4 F (36.3 C), temperature source Axillary, resp. rate 16, height 5\' 8"  (1.727 m), weight 99.791 kg (220 lb), SpO2 99 %.  PHYSICAL EXAMINATION:   Physical Exam  Constitutional: He is well-developed, well-nourished, and in no distress. No distress.  HENT:  Head: Normocephalic.  Eyes: No scleral icterus.  Neck: No JVD present. No tracheal deviation present.  Cardiovascular: Normal rate, regular rhythm and normal heart sounds.  Exam reveals no gallop and no friction rub.   No murmur heard. Pulmonary/Chest: Effort normal and breath sounds normal. No respiratory distress. He has no wheezes. He has no rales. He exhibits no tenderness.  Abdominal:  Soft. Bowel sounds are normal. He exhibits no distension and no mass. There is no tenderness. There is no rebound and no guarding.  Musculoskeletal: He exhibits no edema.  Skin: Skin is warm. No rash noted. No erythema.  Psychiatric:  depressed      LABORATORY PANEL:   CBC  Recent Labs Lab 09/26/15 0524  WBC 12.9*  HGB 12.3*  HCT 36.3*  PLT 247   ------------------------------------------------------------------------------------------------------------------  Chemistries   Recent Labs Lab 09/25/15 1317  10/01/15 0504  NA 137  < > 136  K 3.6  < > 4.3  CL 104  < > 102  CO2 23  < > 29  GLUCOSE 133*  < > 98  BUN 19  < > 8  CREATININE 0.89  < > 0.66  CALCIUM 9.3  < > 8.9  MG 2.5*  --   --   AST 51*  --   --   ALT 27  --   --   ALKPHOS 80  --   --   BILITOT 2.3*  --   --   < > = values in this interval not displayed. ------------------------------------------------------------------------------------------------------------------  Cardiac Enzymes  Recent Labs Lab 09/25/15 1806 09/25/15 2313 09/26/15 0524  TROPONINI 0.08* <0.03 <0.03   ------------------------------------------------------------------------------------------------------------------  RADIOLOGY:  No results found.   ASSESSMENT AND PLAN:   69 year old male who presented with suicidal ideation and found to have SVT with nonsustained V. tach.   1. Nonsustained V. tach after 2 episodes of SVT: Patient is currently in normal sinus rhythm. This may be due to metabolic etiology. His troponin on admission was very slightly elevated however next 2 are normal. Appreciate cardiology consultation. Patient has  been transitioned from IV amiodarone to PO amiodarone. He has a follow-up appointment with cardiology on November 22 at 2 PM. He will need outpatient stress test.  2. Suicidal ideation: Patient needs ongoing psychiatric evaluation for his extreme depression and suicidal ideation. Physical therapy  is recommending skilled nursing facility and therefore patient cannot be transferred to behavioral health. Patient would  benefit from ECT, this will be discussed today by Dr Toni Amend. Corporate investment banker. Follow-up on psychiatry evaluation today.  3. Hyperlipidemia: Continue Zocor  4. Essential hypertension: Continue metoprolol  5. Depression/psych issues: Continue Effexor, Zyprexa and lithium.TSH wnl Needs ECT. His all over body pain is somatization from depression.   6. Diabetes: Continue metformin and sliding scale insulin  7. Urinary retention: Flomax started. Patient still has large amount of residual and therefore Foley was replaced.   CODE STATUS: FULL  TOTAL TIME TAKING CARE OF THIS PATIENT: 25 minutes .  Physical therapy is recommended skilled nursing facility. Dr Toni Amend will discuss with patient need for ECT. If he refuses then he Dr Toni Amend may d/c IVC and patient may go to SNF.    Chrystie Hagwood M.D on 10/01/2015 at 2:45 PM  Between 7am to 6pm - Pager - [phone] After 6pm go to [email or web address] - password [email or web address] [facility]  [place]  Office  [phone]  CC: Primary care physician; No PCP Per Patient  Note: This dictation was prepared with Dragon dictation along with smaller phrase technology. Any transcriptional errors that result from this process are unintentional.

## 2015-10-01 NOTE — Progress Notes (Signed)
Physical Therapy Treatment Patient Details Name: Kyle [name] MRN: [id number] DOB: 12/02/1945 Today's Date: 10/01/2015    History of Present Illness Patient admitted with a depressive episode secondary to Bipolar disorder. He reports feeling his legs feeling weak and heavy "for three weeks". He has been seen by PT department several times in the last month and ambulated >200 feet each occassion.     PT Comments    Pt demonstrates poor motivation and effort during therapy on this date. He requires heavy encouragement as well as stern direction about completing exercises and transfers. Pt is very withdrawn making poor eye contact with therapist throughout session. He continues to request return to bed but eventually agrees to transfer to recliner with encouragement from therapist and sitter. Pt demonstrates poor standing balance and pt appears to be able to self correct with stern correction by therapist. Pt is known to therapist from prior hospital admissions and presents much in the same fashion as prior. During previous admissions pt would alternate between days stating he couldn't more at all and other days where he would ambulate >200' with supervision only and no assistance. Very difficulty to make discharge plans as it is unclear how much of patient's presentation currently is related to true physiological impairment vs motivation/effort. Pt will benefit from skilled PT services to address deficits in strength, balance, and mobility in order to return to full function at home.    Follow Up Recommendations  SNF (May be more appropriate for geri-psych unit per CSW input)     Equipment Recommendations  Rolling walker with 5" wheels    Recommendations for Other Services       Precautions / Restrictions Precautions Precautions: Fall Precaution Comments: Sitter in room.  Restrictions Weight Bearing Restrictions: No    Mobility  Bed Mobility Overal bed mobility: Needs  Assistance Bed Mobility: Supine to Sit     Supine to sit: Min assist     General bed mobility comments: Pt provides poor effort and requires considerable encouragement and some restraint from therapist to provide additional assistance. Eventually pt is able to come up to sitting. He continues to lean to the R and rests his R foream on the bed rail. Pt is able to self correct sitting posture intermittently when he desires.  Transfers Overall transfer level: Needs assistance Equipment used: Rolling walker (2 wheeled) Transfers: Sit to/from Stand Sit to Stand: Min assist;+2 physical assistance         General transfer comment: Pt requires heavy cues for hand placement and anterior weight shifting during transfer. Pt with poor sequencing and poor effort. Once upright pt with intermittent posterior leaning but with refusal by therapist to provide additional assistance pt self corrects. Pt does exhibit an uncontrolled descent onto bed. Pt refuses to stand up any more and states he just wants to lie down because "everything is terrible." However eventually agrees to transfer to recliner  Ambulation/Gait Ambulation/Gait assistance: Mod assist;+2 physical assistance Ambulation Distance (Feet): 3 Feet Assistive device: Rolling walker (2 wheeled) Gait Pattern/deviations: Decreased step length - right;Decreased step length - left;Shuffle     General Gait Details: Pt transfers from bed to recliner. Requires cues and assist for sequencing. Pt with heavy leaning posterior and to the R. Therapist provides stern encouragement to correct and he initially corrects his posture and balance and then falls to the R and posterior onto recliner. Poor effort provided by patient.    Stairs  Wheelchair Mobility    Modified Rankin (Stroke Patients Only)       Balance Overall balance assessment: Needs assistance Sitting-balance support: Feet supported Sitting balance-Leahy Scale:  Fair Sitting balance - Comments: Pt leaning to the R during session complaining of wanting to lay down and not participate with therapy. Postural control: Right lateral lean Standing balance support: Bilateral upper extremity supported Standing balance-Leahy Scale: Poor                      Cognition Arousal/Alertness: Lethargic Behavior During Therapy: Flat affect;Agitated;Anxious;Impulsive Overall Cognitive Status: No family/caregiver present to determine baseline cognitive functioning                      Exercises General Exercises - Lower Extremity Long Arc Quad: Strengthening;Both;5 reps;Seated (Refuses further exercises)    General Comments        Pertinent Vitals/Pain Pain Assessment: No/denies pain    Home Living                      Prior Function            PT Goals (current goals can now be found in the care plan section) Acute Rehab PT Goals Patient Stated Goal: "A new life"  PT Goal Formulation: Patient unable to participate in goal setting Time For Goal Achievement: 10/11/15 Potential to Achieve Goals: Fair Progress towards PT goals: Not progressing toward goals - comment (Poor effort and motivation by patient)    Frequency  Min 2X/week    PT Plan Current plan remains appropriate    Co-evaluation             End of Session Equipment Utilized During Treatment: Gait belt Activity Tolerance: Treatment limited secondary to agitation Patient left: in chair;with call bell/phone within reach;with nursing/sitter in room     Time: 1110-1135 PT Time Calculation (min) (ACUTE ONLY): 25 min  Charges:  $Therapeutic Activity: 23-37 mins                    G Codes:      Sharalyn InkJason D Huprich PT, DPT   Huprich,Jason 10/01/2015, 12:01 PM

## 2015-10-01 NOTE — Clinical Social Work Placement (Signed)
   CLINICAL SOCIAL WORK PLACEMENT  NOTE  Date:  10/01/2015  Patient Details  Name: Kyle [name] MRN: [id number] Date of Birth: [birth year]  Clinical Social Work is seeking post-discharge placement for this patient at the Skilled  Nursing Facility level of care (*CSW will initial, date and re-position this form in  chart as items are completed):  Yes   Patient/family provided with [facility] Clinical Social Work Department's list of facilities offering this level of care within the geographic area requested by the patient (or if unable, by the patient's family).  Yes   Patient/family informed of their freedom to choose among providers that offer the needed level of care, that participate in Medicare, Medicaid or managed care program needed by the patient, have an available bed and are willing to accept the patient.  Yes   Patient/family informed of [facility] ownership interest [facility] and [facility], as well as of the fact that they are under no obligation to receive care at these facilities.  PASRR submitted to EDS on 10/01/15     PASRR number received on       Existing PASRR number confirmed on       FL2 transmitted to all facilities in geographic area requested by pt/family on 10/01/15     FL2 transmitted to all facilities within larger geographic area on       Patient informed that his/her managed care company has contracts with or will negotiate with certain facilities, including the following:            Patient/family informed of bed offers received.  Patient chooses bed at       Physician recommends and patient chooses bed at      Patient to be transferred to   on  .  Patient to be transferred to facility by       Patient family notified on   of transfer.  Name of family member notified:        PHYSICIAN Please sign FL2     Additional Comment:    _______________________________________________ Haig ProphetMorgan, Madden Piazza G, LCSW 10/01/2015, 4:58  PM

## 2015-10-01 NOTE — [place] FL2 (Signed)
[place] MEDICAID FL2 LEVEL OF CARE SCREENING TOOL     IDENTIFICATION  Patient Name: Kyle [name] Birthdate: 02-25-1946 Sex: male Admission Date (Current Location): 09/25/2015  [place]  [facility] [facility]   Facility and Address:  [facility], [address], [place], [place] [address]      Provider Number: [id number] [phone]  Attending Physician Name and Address:  Delfino LovettVipul Shah, MD  Relative Name and Phone Number:       Current Level of Care: Hospital Recommended Level of Care: Skilled Nursing Facility Prior Approval Number:    Date Approved/Denied:   PASRR Number:    Discharge Plan: SNF    Current Diagnoses: Patient Active Problem List   Diagnosis Date Noted  . Noncompliance 09/26/2015  . Ventricular tachycardia (HCC) 09/25/2015  . UTI (urinary tract infection) 09/06/2015  . RLS (restless legs syndrome) 08/08/2015  . Peripheral neuropathy (HCC) 08/08/2015  . Cerebrovascular disease 08/07/2015  . Bipolar I disorder depressed with melancholic features (HCC) 08/01/2015  . Essential tremor 08/01/2015  . Hypertension 04/17/2015    Orientation ACTIVITIES/SOCIAL BLADDER RESPIRATION    Self, Time, Situation, Place  Active Continent Normal  BEHAVIORAL SYMPTOMS/MOOD NEUROLOGICAL BOWEL NUTRITION STATUS   (none )  (none ) Continent Diet (Diet: 2 Grams Sodium. )  PHYSICIAN VISITS COMMUNICATION OF NEEDS Height & Weight Skin  30 days Verbally 5\' 8"  (172.7 cm) 220 lbs. Skin abrasions (Skin Tear on chest and back. )          AMBULATORY STATUS RESPIRATION    Assist extensive Normal      Personal Care Assistance Level of Assistance  Bathing, Feeding, Dressing Bathing Assistance: Limited assistance Feeding assistance: Independent Dressing Assistance: Limited assistance      Functional Limitations Info  Sight, Hearing, Speech Sight Info: Adequate Hearing Info: Adequate Speech Info: Adequate       SPECIAL CARE  FACTORS FREQUENCY  PT (By licensed PT)     PT Frequency:  (5)             Additional Factors Info  Code Status, Psychotropic, Insulin Sliding Scale Code Status Info:  (Full Code. )   Psychotropic Info:  (Lithium ) Insulin Sliding Scale Info:  (at bed time. )       Current Medications (10/01/2015): Current Facility-Administered Medications  Medication Dose Route Frequency Provider Last Rate Last Dose  . acetaminophen (TYLENOL) tablet 650 mg  650 mg Oral Q6H PRN Auburn BilberryShreyang Patel, MD   650 mg at 09/29/15 1859   Or  . acetaminophen (TYLENOL) suppository 650 mg  650 mg Rectal Q6H PRN Auburn BilberryShreyang Patel, MD      . amiodarone (PACERONE) tablet 400 mg  400 mg Oral Daily Rise MuEileen A Romano, PA-C   400 mg at 10/01/15 0845  . aspirin chewable tablet 81 mg  81 mg Oral Daily Adrian SaranSital Mody, MD   81 mg at 10/01/15 0846  . bisacodyl (DULCOLAX) suppository 10 mg  10 mg Rectal Daily Adrian SaranSital Mody, MD   10 mg at 10/01/15 1448  . enoxaparin (LOVENOX) injection 40 mg  40 mg Subcutaneous Q24H Adrian SaranSital Mody, MD   40 mg at 09/30/15 2233  . gabapentin (NEURONTIN) capsule 300 mg  300 mg Oral BID Auburn BilberryShreyang Patel, MD   300 mg at 10/01/15 0845  . HYDROcodone-acetaminophen (NORCO/VICODIN) 5-325 MG per tablet 1-2 tablet  1-2 tablet Oral Q4H PRN Auburn BilberryShreyang Patel, MD   1 tablet at 10/01/15 1449  . insulin aspart (novoLOG) injection 0-5 Units  0-5 Units Subcutaneous QHS Adrian Saran, MD   0 Units at 09/26/15 2138  . insulin aspart (novoLOG) injection 0-9 Units  0-9 Units Subcutaneous TID WC Adrian Saran, MD   2 Units at 09/30/15 1841  . lithium carbonate (LITHOBID) CR tablet 900 mg  900 mg Oral QHS Audery Amel, MD   900 mg at 09/30/15 2235  . LORazepam (ATIVAN) tablet 1 mg  1 mg Oral QHS Auburn Bilberry, MD   1 mg at 09/30/15 2235  . metFORMIN (GLUCOPHAGE) tablet 500 mg  500 mg Oral BID WC Auburn Bilberry, MD   500 mg at 10/01/15 0846  . metoprolol tartrate (LOPRESSOR) tablet 25 mg  25 mg Oral BID Auburn Bilberry, MD   25 mg at 10/01/15  0846  . morphine 2 MG/ML injection 1 mg  1 mg Intravenous Q3H PRN Auburn Bilberry, MD      . OLANZapine (ZYPREXA) tablet 10 mg  10 mg Oral QHS Auburn Bilberry, MD   10 mg at 09/30/15 2234  . OLANZapine (ZYPREXA) tablet 20 mg  20 mg Oral QHS Auburn Bilberry, MD   20 mg at 09/30/15 2235  . ondansetron (ZOFRAN) tablet 4 mg  4 mg Oral Q6H PRN Auburn Bilberry, MD       Or  . ondansetron (ZOFRAN) injection 4 mg  4 mg Intravenous Q6H PRN Auburn Bilberry, MD      . polyethylene glycol (MIRALAX / GLYCOLAX) packet 17 g  17 g Oral Daily Adrian Saran, MD   17 g at 09/30/15 0918  . potassium chloride (KLOR-CON) packet 20 mEq  20 mEq Oral BID Adrian Saran, MD   20 mEq at 10/01/15 0846  . primidone (MYSOLINE) tablet 50 mg  50 mg Oral QHS Auburn Bilberry, MD   50 mg at 09/30/15 2235  . simvastatin (ZOCOR) tablet 10 mg  10 mg Oral q1800 Auburn Bilberry, MD   10 mg at 09/30/15 1842  . sodium chloride 0.9 % injection 3 mL  3 mL Intravenous Q12H Auburn Bilberry, MD   3 mL at 09/30/15 2236  . tamsulosin (FLOMAX) capsule 0.4 mg  0.4 mg Oral Daily Adrian Saran, MD   0.4 mg at 09/30/15 1842  . venlafaxine XR (EFFEXOR-XR) 24 hr capsule 150 mg  150 mg Oral Q breakfast Auburn Bilberry, MD   150 mg at 10/01/15 0845   Do not use this list as official medication orders. Please verify with discharge summary.  Discharge Medications:   Medication List    STOP taking these medications        divalproex 500 MG 24 hr tablet  Commonly known as:  DEPAKOTE ER     propranolol ER 80 MG 24 hr capsule  Commonly known as:  INDERAL LA      TAKE these medications        amiodarone 400 MG tablet  Commonly known as:  PACERONE  Take 1 tablet (400 mg total) by mouth daily.     amLODipine 10 MG tablet  Commonly known as:  NORVASC  Take 1 tablet (10 mg total) by mouth daily.     aspirin 81 MG chewable tablet  Chew 1 tablet (81 mg total) by mouth daily.     gabapentin 400 MG capsule  Commonly known as:  NEURONTIN  Take 1 capsule (400 mg  total) by mouth 3 (three) times daily.     lidocaine 5 %  Commonly known as:  LIDODERM  Place 1 patch onto the skin daily. Remove &  Discard patch within 12 hours or as directed by MD     lithium carbonate 300 MG CR tablet  Commonly known as:  LITHOBID  Take 600 mg by mouth at bedtime.     LORazepam 1 MG tablet  Commonly known as:  ATIVAN  Take 1 tablet (1 mg total) by mouth at bedtime.     metFORMIN 500 MG tablet  Commonly known as:  GLUCOPHAGE  Take 1 tablet (500 mg total) by mouth 2 (two) times daily with a meal.     metoprolol tartrate 25 MG tablet  Commonly known as:  LOPRESSOR  Take 1 tablet (25 mg total) by mouth 2 (two) times daily.     OLANZapine 20 MG tablet  Commonly known as:  ZYPREXA  Take 20 mg by mouth at bedtime. Pt takes with a  tablet.     OLANZapine 10 MG tablet  Commonly known as:  ZYPREXA  Take 10 mg by mouth at bedtime. Pt takes with a  tablet.     primidone 50 MG tablet  Commonly known as:  MYSOLINE  Take 1 tablet (50 mg total) by mouth at bedtime.     rOPINIRole 0.25 MG tablet  Commonly known as:  REQUIP  Take 1 tablet (0.25 mg total) by mouth at bedtime.     simvastatin 10 MG tablet  Commonly known as:  ZOCOR  Take 1 tablet (10 mg total) by mouth daily at 6 PM.     venlafaxine XR 150 MG 24 hr capsule  Commonly known as:  EFFEXOR-XR  Take 1 capsule (150 mg total) by mouth daily with breakfast.        Relevant Imaging Results:  Relevant Lab Results:  Recent Labs    Additional Information  (SSN: [id number])  Haig Prophet, LCSW

## 2015-10-01 NOTE — Progress Notes (Signed)
Pt refuses scheduled laxatives. rn spoke with dr mody,dulolax supp qd prn ,miralax qd prn . Pt no longer needs potassium . Med d/c

## 2015-10-01 NOTE — Progress Notes (Signed)
   SUBJECTIVE: Pt in bed.    Filed Vitals:   09/30/15 1536 10/01/15 0141 10/01/15 0514 10/01/15 0723  BP:  105/89 120/54 132/57  Pulse:  53 50 53  Temp: 98 F (36.7 C) 97.3 F (36.3 C) 97.4 F (36.3 C) 97.4 F (36.3 C)  TempSrc: Oral Oral Oral Axillary  Resp:  20 18 16   Height:      Weight:      SpO2:  97% 96% 99%    Intake/Output Summary (Last 24 hours) at 10/01/15 0903 Last data filed at 10/01/15 0155  Gross per 24 hour  Intake   1163 ml  Output   3525 ml  Net  -2362 ml    LABS: Basic Metabolic Panel:  Recent Labs  16/08/9610/07/16 0235 10/01/15 0504  NA  --  136  K  --  4.3  CL  --  102  CO2  --  29  GLUCOSE  --  98  BUN  --  8  CREATININE 0.66 0.66  CALCIUM  --  8.9   Liver Function Tests: No results for input(s): AST, ALT, ALKPHOS, BILITOT, PROT, ALBUMIN in the last 72 hours. No results for input(s): LIPASE, AMYLASE in the last 72 hours. CBC: No results for input(s): WBC, NEUTROABS, HGB, HCT, MCV, PLT in the last 72 hours. Cardiac Enzymes: No results for input(s): CKTOTAL, CKMB, CKMBINDEX, TROPONINI in the last 72 hours. BNP: Invalid input(s): POCBNP D-Dimer: No results for input(s): DDIMER in the last 72 hours. Hemoglobin A1C: No results for input(s): HGBA1C in the last 72 hours. Fasting Lipid Panel: No results for input(s): CHOL, HDL, LDLCALC, TRIG, CHOLHDL, LDLDIRECT in the last 72 hours. Thyroid Function Tests:  Recent Labs  09/30/15 1310  TSH 1.906   Anemia Panel: No results for input(s): VITAMINB12, FOLATE, FERRITIN, TIBC, IRON, RETICCTPCT in the last 72 hours.   PHYSICAL EXAM General: Well developed, well nourished, in no acute distress HEENT: Normocephalic and atramatic Neck: No JVD.  Lungs: Clear bilaterally to auscultation and percussion. Heart: HRRR . Normal S1 and S2 without gallops or murmurs.  Abdomen: Bowel sounds are positive, abdomen soft and non-tender  Extremities: No clubbing, cyanosis or edema.   TELEMETRY:  Reviewed telemetry pt in NSR, no further episodes of SVT or VT  ASSESSMENT AND PLAN:  1. NS VT: Continue amiodarone PO 400mg  daily. Pt will likely need NST as outpatient to evaluate cause of VT, although it is likely metabolic. Pt given outpatient f/u 11/22 at 2pm.    Patient and plan discussed with supervising provider, Dr. Adrian BlackwaterShaukat Khan, who agrees with above findings.   Alinda SierrasEileen A. Margarito CourserRomano, PA-C [facility]  10/01/2015 9:03 AM

## 2015-10-01 NOTE — Progress Notes (Signed)
oob to chair for 2.5 hours . Required 1+ assistance. Experiencing  Generalized pain relieved with norco. Ivc. Sitter at bedside. Verbalizes when questions. Calm.ate large breakfast
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
## 2015-10-01 NOTE — Care Management Important Message (Signed)
Important Message  Patient Details  Name: Kyle JacquetMarvin D Jann MRN: [id number] Date of Birth: 01-Aug-1946   Medicare Important Message Given:  Yes-second notification given    Collie Siadngela Johnson, RN 10/01/2015, 8:35 AM

## 2015-10-01 NOTE — Progress Notes (Signed)
Clinical Social Worker (CSW) met with patient to discuss D/C plan. Patient reported that she lives in [place] alone and his sister is his only support. Patient reported that he prefers to go home but is open to SNF search. Patient prefers [facility] in [place]. FL2 complete and bed search started. PASARR is pending.  Blima Rich, [place] [phone]

## 2015-10-02 ENCOUNTER — Other Ambulatory Visit: Payer: Self-pay

## 2015-10-02 LAB — CBC
HCT: 34.6 % — ABNORMAL LOW (ref 40.0–52.0)
Hemoglobin: 11.7 g/dL — ABNORMAL LOW (ref 13.0–18.0)
MCH: 32.1 pg (ref 26.0–34.0)
MCHC: 33.8 g/dL (ref 32.0–36.0)
MCV: 94.9 fL (ref 80.0–100.0)
Platelets: 307 10*3/uL (ref 150–440)
RBC: 3.65 MIL/uL — ABNORMAL LOW (ref 4.40–5.90)
RDW: 12.3 % (ref 11.5–14.5)
WBC: 11 10*3/uL — ABNORMAL HIGH (ref 3.8–10.6)

## 2015-10-02 LAB — GLUCOSE, CAPILLARY
GLUCOSE-CAPILLARY: 102 mg/dL — AB (ref 65–99)
GLUCOSE-CAPILLARY: 78 mg/dL (ref 65–99)
Glucose-Capillary: 103 mg/dL — ABNORMAL HIGH (ref 65–99)

## 2015-10-02 NOTE — Progress Notes (Signed)
   SUBJECTIVE: Pt in bed, states he is feeling sad today. No CP or palpitations.    Filed Vitals:   10/01/15 1545 10/01/15 1940 10/02/15 0342 10/02/15 0737  BP: 126/59 140/62 134/58 134/55  Pulse: 57 58 59 61  Temp: 98.3 F (36.8 C) 97.6 F (36.4 C) 97.3 F (36.3 C) 98 F (36.7 C)  TempSrc: Oral Oral Oral Oral  Resp: 18 18 18 18   Height:      Weight:      SpO2: 99% 97% 96% 97%    Intake/Output Summary (Last 24 hours) at 10/02/15 1119 Last data filed at 10/02/15 1100  Gross per 24 hour  Intake   3850 ml  Output   6401 ml  Net  -2551 ml    LABS: Basic Metabolic Panel:  Recent Labs  56/21/3010/06/07 0235 10/01/15 0504  NA  --  136  K  --  4.3  CL  --  102  CO2  --  29  GLUCOSE  --  98  BUN  --  8  CREATININE 0.66 0.66  CALCIUM  --  8.9   Liver Function Tests: No results for input(s): AST, ALT, ALKPHOS, BILITOT, PROT, ALBUMIN in the last 72 hours. No results for input(s): LIPASE, AMYLASE in the last 72 hours. CBC: No results for input(s): WBC, NEUTROABS, HGB, HCT, MCV, PLT in the last 72 hours. Cardiac Enzymes: No results for input(s): CKTOTAL, CKMB, CKMBINDEX, TROPONINI in the last 72 hours. BNP: Invalid input(s): POCBNP D-Dimer: No results for input(s): DDIMER in the last 72 hours. Hemoglobin A1C: No results for input(s): HGBA1C in the last 72 hours. Fasting Lipid Panel: No results for input(s): CHOL, HDL, LDLCALC, TRIG, CHOLHDL, LDLDIRECT in the last 72 hours. Thyroid Function Tests:  Recent Labs  09/30/15 1310  TSH 1.906   Anemia Panel:  Recent Labs  09/30/15 1310  VITAMINB12 388     PHYSICAL EXAM General: Well developed, well nourished, in no acute distress HEENT: Normocephalic and atramatic Neck: No JVD.  Lungs: Clear bilaterally to auscultation and percussion. Heart: HRRR . Normal S1 and S2 without gallops or murmurs.  Abdomen: Bowel sounds are positive, abdomen soft and non-tender  Extremities: No clubbing, cyanosis or edema.    ASSESSMENT AND PLAN:  1. NS VT: Continue amiodarone PO 400mg  daily. Pt will likely need NST as outpatient to evaluate cause of VT, although it is likely metabolic. Pt given outpatient f/u 11/22 at 2pm.   Patient and plan discussed with supervising provider, Dr. Adrian BlackwaterShaukat Khan, who agrees with above findings.   Alinda SierrasEileen A. Margarito CourserRomano, PA-C [facility]  10/02/2015 11:19 AM

## 2015-10-02 NOTE — Consult Note (Signed)
  Psychiatry: Follow-up on this 69 year old man with bipolar disorder. He says today that he feels "about the same". Still very limited in his activity. Affect flat. Speech slow. Not making much effort.  Bipolar disorder with severe depression with melancholic features. I have recommended to him that we use ECT. This is based on the severity of his illness as well as his limited response to prior medicine. Patient is agreeable. Risks and benefits discussed. ECT team is instructed to come by and talk with him and get him to sign the consent. I will get an EKG and a CBC done today. Follow-up tomorrow. Probably start ECT on Friday. No change to medicine.

## 2015-10-02 NOTE — Progress Notes (Signed)
[facility] - [place] at [facility]   PATIENT NAME: Kyle [name]    MR#:  [id number]  DATE OF BIRTH:  01-08-1946  SUBJECTIVE:  Patient lying in bed. Sitter at bedside. No new c/o REVIEW OF SYSTEMS:    Review of Systems  Constitutional: Positive for malaise/fatigue. Negative for fever and chills.  HENT: Negative for sore throat.   Eyes: Negative for blurred vision and pain.  Respiratory: Negative for cough, hemoptysis, shortness of breath and wheezing.   Cardiovascular: Negative for chest pain, palpitations and leg swelling.  Gastrointestinal: Negative for nausea, vomiting, abdominal pain, diarrhea and blood in stool.  Genitourinary: Negative for dysuria.  Musculoskeletal: Positive for myalgias, back pain, joint pain and neck pain.  Neurological: Positive for weakness. Negative for dizziness, tingling, tremors, sensory change, speech change and headaches.  Endo/Heme/Allergies: Does not bruise/bleed easily.  Psychiatric/Behavioral: Positive for depression. Negative for suicidal ideas and hallucinations. The patient is not nervous/anxious and does not have insomnia.     Tolerating Diet: Yes DRUG ALLERGIES:  No Known Allergies VITALS:  Blood pressure 142/54, pulse 56, temperature 98.3 F (36.8 C), temperature source Oral, resp. rate 16, height  (1.727 m), weight 99.791 kg (220 lb), SpO2 93 %. PHYSICAL EXAMINATION:   Physical Exam  Constitutional: He is well-developed, well-nourished, and in no distress. No distress.  HENT:  Head: Normocephalic.  Eyes: No scleral icterus.  Neck: No JVD present. No tracheal deviation present.  Cardiovascular: Normal rate, regular rhythm and normal heart sounds.  Exam reveals no gallop and no friction rub.   No murmur heard. Pulmonary/Chest: Effort normal and breath sounds normal. No respiratory distress. He has no wheezes. He has no rales. He exhibits no tenderness.  Abdominal: Soft. Bowel sounds are normal. He  exhibits no distension and no mass. There is no tenderness. There is no rebound and no guarding.  Musculoskeletal: He exhibits no edema.  Skin: Skin is warm. No rash noted. No erythema.  Psychiatric:  depressed   LABORATORY PANEL:   CBC  Recent Labs Lab 10/02/15 1314  WBC 11.0*  HGB 11.7*  HCT 34.6*  PLT 307   ------------------------------------------------------------------------------------------------------------------  Chemistries   Recent Labs Lab 10/01/15 0504  NA 136  K 4.3  CL 102  CO2 29  GLUCOSE 98  BUN 8  CREATININE 0.66  CALCIUM 8.9   ------------------------------------------------------------------------------------------------------------------  Cardiac Enzymes  Recent Labs Lab 09/25/15 1806 09/25/15 2313 09/26/15 0524  TROPONINI 0.08* <0.03 <0.03   ------------------------------------------------------------------------------------------------------------------  RADIOLOGY:  No results found.   ASSESSMENT AND PLAN:   69 year old male who presented with suicidal ideation and found to have SVT with nonsustained V. tach.   1. Nonsustained V. tach after 2 episodes of SVT: Patient is currently in normal sinus rhythm. This may be due to metabolic etiology. His troponin on admission was very slightly elevated however next 2 are normal. Appreciate cardiology consultation. Patient has been transitioned from IV amiodarone to PO amiodarone. He has a follow-up appointment with cardiology on November 22 at 2 PM. He will need outpatient stress test.  2. Suicidal ideation: Patient needs ongoing psychiatric evaluation for his extreme depression and suicidal ideation. Physical therapy is recommending skilled nursing facility and therefore patient cannot be transferred to behavioral health. Patient would  benefit from ECT, which is planned for this Friday. D/w dr clapacs  3. Hyperlipidemia: Continue Zocor  4. Essential hypertension: Continue  metoprolol  5. Depression/psych issues: Continue Effexor, Zyprexa and lithium.TSH wnl Needs ECT. His all over  body pain is somatization from depression.  6. Diabetes: Continue metformin and sliding scale insulin  7. Urinary retention: Flomax started. Patient still has large amount of residual and therefore Foley was inserted y'day. Will remove and have voiding trial.  CODE STATUS: FULL  TOTAL TIME TAKING CARE OF THIS PATIENT: 25 minutes .  Physical therapy is recommended skilled nursing facility. Dr Toni Amendlapacs will discuss with patient need for ECT. D/w dr clapacs who is planning for ECT but for that he needs foley out (which I have ordered) and him to be able to walk to the bathroom (per RN he is 2 person assist rightnow). - will await PT eval tomorrow before psych transfer   [facility], Thanya Cegielski M.D on 10/02/2015 at 5:43 PM  Between 7am to 6pm - Pager - [phone] After 6pm go to [email or web address] - password [email or web address] [facility]  [facility]  Office  [phone]  CC: Primary care physician; No PCP Per Patient  Note: This dictation was prepared with Dragon dictation along with smaller phrase technology. Any transcriptional errors that result from this process are unintentional.

## 2015-10-02 NOTE — Progress Notes (Signed)
Physical Therapy Treatment Patient Details Name: Kyle JacquetMarvin D [name] MRN: [id number] DOB: Aug 03, 1946 Today's Date: 10/02/2015    History of Present Illness Patient admitted with a depressive episode secondary to Bipolar disorder. He reports feeling his legs feeling weak and heavy "for three weeks". He has been seen by PT department several times in the last month and ambulated >200 feet each occassion.     PT Comments    Pt demonstrates improved interaction with therapist on this date. Spent the majority of session engaging pt to offer distraction during functional mobility. 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 Pt able to perform sit to stand and able to improve stability with cues and feedback. Pt still with posterior leaning in standing and poor balance/safety awareness. Attempted forward/backward stepping with patient but he is only able to take one small step and declines further ambulation. Unclear if this is motivation/effort or true weakness on the part of the patient. Nevertheless participation is improved on this date. Pt will benefit from skilled PT services to address deficits in strength, balance, and mobility in order to return to full function at home.    Follow Up Recommendations  SNF (May be more appropriate for geri-psych unit per CSW input)     Equipment Recommendations  Rolling walker with 5" wheels    Recommendations for Other Services       Precautions / Restrictions Precautions Precautions: Fall Precaution Comments: Sitter in room.  Restrictions Weight Bearing Restrictions: No    Mobility  Bed Mobility Overal bed mobility: Needs Assistance Bed Mobility: Supine to Sit     Supine to sit: Min guard Sit to supine: Min guard   General bed mobility comments: Pt demonstrates improved sequencing today. Distracted pt with conversation and pt able to come upright to sitting. Pt stable in sitting and no further leaning to the R observed  Transfers Overall  transfer level: Needs assistance Equipment used: Rolling walker (2 wheeled) Transfers: Sit to/from Stand Sit to Stand: Min assist         General transfer comment: Pt provided heavy cues and instruction for sit to stand. Performed x 4 with patient. Pt has poor anterior weight shifting but is able to correct with cues. In standing pt reports he has pain on the bottom of his feet and has to sit down. With distraction pt agrees to continue attempts. Pt performs standing marching and one small step forward/backward. Poor safety awareness and poor balance. Unable/unwilling to ambulate further at this time.  Ambulation/Gait                 Stairs            Wheelchair Mobility    Modified Rankin (Stroke Patients Only)       Balance Overall balance assessment: Needs assistance   Sitting balance-Leahy Scale: Fair       Standing balance-Leahy Scale: Poor                      Cognition Arousal/Alertness: Lethargic Behavior During Therapy: Flat affect;Agitated;Anxious;Impulsive Overall Cognitive Status: No family/caregiver present to determine baseline cognitive functioning                      Exercises General Exercises - Lower Extremity Ankle Circles/Pumps: Strengthening;15 reps;Supine;Both Long Arc Quad: Strengthening;Both;Seated;10 reps Hip Flexion/Marching: Strengthening;Both;10 reps;Seated    General Comments        Pertinent Vitals/Pain Pain Assessment: 0-10 Pain Score:  (Unable to  rate) Pain Location: Pt states that he "hurts all over" Pain Intervention(s): Monitored during session    Home Living                      Prior Function            PT Goals (current goals can now be found in the care plan section) Acute Rehab PT Goals Patient Stated Goal: "A new life"  PT Goal Formulation: Patient unable to participate in goal setting Time For Goal Achievement: 10/11/15 Potential to Achieve Goals: Fair Progress towards PT  goals: Progressing toward goals    Frequency  Min 2X/week    PT Plan Current plan remains appropriate    Co-evaluation             End of Session Equipment Utilized During Treatment: Gait belt Activity Tolerance: Treatment limited secondary to agitation Patient left: with call bell/phone within reach;with nursing/sitter in room;in bed;with bed alarm set     Time: 1478-2956 PT Time Calculation (min) (ACUTE ONLY): 16 min  Charges:  $Therapeutic Activity: 8-22 mins                    G Codes:      Sharalyn Ink Tearah Saulsbury PT, DPT   Campbell Agramonte 10/02/2015, 1:45 PM

## 2015-10-02 NOTE — Progress Notes (Signed)
Chris admissions coordinator [facility] the [facility] came to visit patient today. Patient did give permission for Thayer OhmChris to visit. [facility] offered patient a bed. Patient accepted bed offer. [facility] is pending. CSW faxed 30 day note to [facility] today. Case discussed with Dr. Toni Amendlapacs. Per Dr. Toni Amendlapacs if patient gives consent ECT treatment will start and patient can [facility]. Clinical Social Worker (CSW) will continue to follow and assist as needed.   Jetta LoutBailey Morgan, LCSWA [phone]

## 2015-10-02 NOTE — Progress Notes (Signed)
Spoke with Dr. Sherryll BurgerShah about potential discharge to behavioral medicine. Patient with foley catheter in place. Order received to discontinue foley.

## 2015-10-02 NOTE — Care Management Note (Signed)
Case Management Note  Patient Details  Name: Kyle [name] MRN: [id number] Date of Birth: Jan 29, 1946  Subjective/Objective:                  Met with patient who is now sitting in chair and states it is very uncomfortable. He originally refused to get up with PT (today and yesterday). I encouraged him to try to sit for as long as he could- he agreed. I handed him a home health agency list and he seemed to struggle with taking the paper from me- I laid the list in his lap. Dr. Algie Coffer suggests that this is "psycosomatic" as patient was able to walk and manage himself- plan for transfer to behavioral unit when Foley is removed. Patient is still under IVC for suicide ideation and 1:1. I understand that his ex-wife recently died last Jan 09, 2023 which was not brought up during my assessment. ECT has been delayed until Friday.  Action/Plan: List of home health agencies left with patient. He is not ready for discharge to home based on his inability to do ADLs per this RN's assessment- related to his safety.  RNCM will continue to follow.   Expected Discharge Date:                  Expected Discharge Plan:     In-House Referral:  Clinical Social Work  Discharge planning Services  CM Consult  Post Acute Care Choice:  Home Health, Durable Medical Equipment Choice offered to:  Patient  DME Arranged:    DME Agency:     HH Arranged:    [place] Agency:     Status of Service:  In process, will continue to follow  Medicare Important Message Given:  Yes-second notification given Date Medicare IM Given:    Medicare IM give by:    Date Additional Medicare IM Given:    Additional Medicare Important Message give by:     If discussed at [place] of Stay Meetings, dates discussed:    Additional Comments:  Marshell Garfinkel, RN 10/02/2015, 12:19 PM

## 2015-10-03 ENCOUNTER — Other Ambulatory Visit: Payer: Self-pay

## 2015-10-03 LAB — GLUCOSE, CAPILLARY
GLUCOSE-CAPILLARY: 83 mg/dL (ref 65–99)
GLUCOSE-CAPILLARY: 83 mg/dL (ref 65–99)
GLUCOSE-CAPILLARY: 76 mg/dL (ref 65–99)
Glucose-Capillary: 78 mg/dL (ref 65–99)

## 2015-10-03 MED ORDER — ENSURE ENLIVE PO LIQD
237.0000 mL | Freq: Two times a day (BID) | ORAL | Status: DC
  Administered 2015-10-03 – 2015-10-04 (×2): 237 mL via ORAL

## 2015-10-03 NOTE — Plan of Care (Signed)
Problem: Spiritual Needs Goal: Ability to function at adequate level Outcome: Progressing Patient more alert and able to express discomfort

## 2015-10-03 NOTE — Progress Notes (Signed)
Initial Nutrition Assessment      INTERVENTION:  Meals and snacks: Recommend liberalizing diet to regular with chopped meats.   Nutrition Supplement Therapy: Recommend Ensure Enlive po BID, each supplement provides 350 kcal and 20 grams of protein    NUTRITION DIAGNOSIS:   Inadequate oral intake related to acute illness as evidenced by per patient/family report, meal completion < 50%.    GOAL:   Patient will meet greater than or equal to 90% of their needs    MONITOR:    (Energy intake)  REASON FOR ASSESSMENT:   LOS    ASSESSMENT:      Pt admitted with v tach, suicidal ideations, planning ECT on Friday  Past Medical History  Diagnosis Date  . 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 Bipolar 1 disorder (HCC)   . HTN   . Cerebrovascular disease   . Neuropathy (HCC)   . SVT (supraventricular tachycardia) (HCC)     Current Nutrition: pt eating 37% of meals since admission for the past 8 days.  Pt reports food is bland and no real appetite  Food/Nutrition-Related History: pt reports intake has been decreased prior to admission   Scheduled Medications:  . amiodarone  400 mg Oral Daily  . aspirin  81 mg Oral Daily  . enoxaparin (LOVENOX) injection  40 mg Subcutaneous Q24H  . feeding supplement (ENSURE ENLIVE)  237 mL Oral BID BM  . gabapentin  300 mg Oral BID  . insulin aspart  0-5 Units Subcutaneous QHS  . insulin aspart  0-9 Units Subcutaneous TID WC  . lithium carbonate  900 mg Oral QHS  . LORazepam  1 mg Oral QHS  . metFORMIN  500 mg Oral BID WC  . metoprolol tartrate  25 mg Oral BID  . OLANZapine  10 mg Oral QHS  . OLANZapine  20 mg Oral QHS  . primidone  50 mg Oral QHS  . simvastatin  10 mg Oral q1800  . sodium chloride  3 mL Intravenous Q12H  . tamsulosin  0.4 mg Oral Daily  . venlafaxine XR  150 mg Oral Q breakfast       Electrolyte/Renal Profile and Glucose Profile:   Recent Labs Lab 09/27/15 0624 09/30/15 0235 10/01/15 0504  NA  144  --  136  K 3.7  --  4.3  CL 112*  --  102  CO2 28  --  29  BUN 6  --  8  CREATININE 0.66 0.66 0.66  CALCIUM 8.6*  --  8.9  GLUCOSE 91  --  98    Gastrointestinal Profile: Last BM:11/8   Nutrition-Focused Physical Exam Findings: Nutrition-Focused physical exam completed. Findings are WDL for fat depletion, muscle depletion, and edema.     Weight Change: noted wt gain prior to admission, pt was reporting weight loss    Diet Order:  Diet NPO time specified Diet NPO time specified Diet regular Room service appropriate?: Yes; Fluid consistency:: Thin  Skin:   reviewed   Height:   Ht Readings from Last 1 Encounters:  09/25/15 5\' 8"  (1.727 m)    Weight:   Wt Readings from Last 1 Encounters:  09/25/15 220 lb (99.791 kg)     BMI:  Body mass index is 33.46 kg/(m^2).  EDUCATION NEEDS:   No education needs identified at this time  LOW Care Level  Edie Vallandingham B. Freida BusmanAllen, RD, LDN [phone] (pager)

## 2015-10-03 NOTE — Progress Notes (Signed)
   SUBJECTIVE: Pt in bed eating breakfast, sitter at bedside, no CP or SOB.    Filed Vitals:   10/02/15 1541 10/02/15 1940 10/03/15 0426 10/03/15 0726  BP: 142/54 137/58 139/53 140/55  Pulse: 56 53 51 54  Temp: 98.3 F (36.8 C) 97.5 F (36.4 C) 98.9 F (37.2 C) 98.1 F (36.7 C)  TempSrc: Oral Oral Axillary Oral  Resp: 16 18 18 18   Height:      Weight:      SpO2: 93% 97% 95% 92%    Intake/Output Summary (Last 24 hours) at 10/03/15 0858 Last data filed at 10/03/15 0704  Gross per 24 hour  Intake   3120 ml  Output   4900 ml  Net  -1780 ml    LABS: Basic Metabolic Panel:  Recent Labs  78/29/5610/07/08 0504  NA 136  K 4.3  CL 102  CO2 29  GLUCOSE 98  BUN 8  CREATININE 0.66  CALCIUM 8.9   Liver Function Tests: No results for input(s): AST, ALT, ALKPHOS, BILITOT, PROT, ALBUMIN in the last 72 hours. No results for input(s): LIPASE, AMYLASE in the last 72 hours. CBC:  Recent Labs  10/02/15 1314  WBC 11.0*  HGB 11.7*  HCT 34.6*  MCV 94.9  PLT 307   Cardiac Enzymes: No results for input(s): CKTOTAL, CKMB, CKMBINDEX, TROPONINI in the last 72 hours. BNP: Invalid input(s): POCBNP D-Dimer: No results for input(s): DDIMER in the last 72 hours. Hemoglobin A1C: No results for input(s): HGBA1C in the last 72 hours. Fasting Lipid Panel: No results for input(s): CHOL, HDL, LDLCALC, TRIG, CHOLHDL, LDLDIRECT in the last 72 hours. Thyroid Function Tests:  Recent Labs  09/30/15 1310  TSH 1.906   Anemia Panel:  Recent Labs  09/30/15 1310  VITAMINB12 388     PHYSICAL EXAM General: Well developed, well nourished, in no acute distress HEENT: Normocephalic and atramatic Neck: No JVD.  Lungs: Clear bilaterally to auscultation and percussion. Heart: HRRR . Normal S1 and S2 without gallops or murmurs.  Abdomen: Bowel sounds are positive, abdomen soft and non-tender  Extremities: No clubbing, cyanosis or edema.   ASSESSMENT AND PLAN:  1. NS VT: Continue  amiodarone PO 400mg  daily. Pt will likely need NST as outpatient to evaluate cause of VT, although it is likely metabolic. Pt given outpatient f/u 11/22 at 2pm.   Patient and plan discussed with supervising provider, Dr. Adrian BlackwaterShaukat Khan, who agrees with above findings.   Alinda SierrasEileen A. Margarito CourserRomano, PA-C [facility]  10/03/2015 8:58 AM

## 2015-10-03 NOTE — Progress Notes (Signed)
[facility] [place] [facility]   PATIENT NAME: Kyle [name]    MR#:  [id number]  DATE OF BIRTH:  Oct 29, 1946  SUBJECTIVE:  Patient lying in bed. In no acute distress. Sitter at bedside.  Complains of pain all over.  Condom catheter placed due to incontinence. Urinary retention has resolved.  REVIEW OF SYSTEMS:    Review of Systems  Constitutional: Positive for malaise/fatigue. Negative for fever and chills.  HENT: Negative for sore throat.   Eyes: Negative for blurred vision and pain.  Respiratory: Negative for cough, hemoptysis, shortness of breath and wheezing.   Cardiovascular: Negative for chest pain, palpitations and leg swelling.  Gastrointestinal: Negative for nausea, vomiting, abdominal pain, diarrhea and blood in stool.  Genitourinary: Negative for dysuria.  Musculoskeletal: Positive for myalgias, back pain, joint pain and neck pain.  Neurological: Positive for weakness. Negative for dizziness, tingling, tremors, sensory change, speech change and headaches.  Endo/Heme/Allergies: Does not bruise/bleed easily.  Psychiatric/Behavioral: Positive for depression. Negative for suicidal ideas and hallucinations. The patient is not nervous/anxious and does not have insomnia.     Tolerating Diet: Yes DRUG ALLERGIES:  No Known Allergies VITALS:  Blood pressure 140/55, pulse 54, temperature 98.1 F (36.7 C), temperature source Oral, resp. rate 18, height 5\' 8"  (1.727 m), weight 99.791 kg (220 lb), SpO2 92 %. PHYSICAL EXAMINATION:   Physical Exam  Constitutional: He is well-developed, well-nourished, and in no distress. No distress.  HENT:  Head: Normocephalic.  Eyes: No scleral icterus.  Neck: No JVD present. No tracheal deviation present.  Cardiovascular: Normal rate, regular rhythm and normal heart sounds.  Exam reveals no gallop and no friction rub.   No murmur heard. Pulmonary/Chest: Effort normal and breath sounds normal. No respiratory  distress. He has no wheezes. He has no rales. He exhibits no tenderness.  Abdominal: Soft. Bowel sounds are normal. He exhibits no distension and no mass. There is no tenderness. There is no rebound and no guarding.  Musculoskeletal: He exhibits no edema.  Skin: Skin is warm. No rash noted. No erythema.  Psychiatric:  depressed   LABORATORY PANEL:   CBC  Recent Labs Lab 10/02/15 1314  WBC 11.0*  HGB 11.7*  HCT 34.6*  PLT 307   ------------------------------------------------------------------------------------------------------------------  Chemistries   Recent Labs Lab 10/01/15 0504  NA 136  K 4.3  CL 102  CO2 29  GLUCOSE 98  BUN 8  CREATININE 0.66  CALCIUM 8.9   ------------------------------------------------------------------------------------------------------------------  Cardiac Enzymes No results for input(s): TROPONINI in the last 168 hours. ------------------------------------------------------------------------------------------------------------------  RADIOLOGY:  No results found.   ASSESSMENT AND PLAN:   69 year old male who presented with suicidal ideation and found to have SVT with nonsustained V. tach.   1. Nonsustained V. tach after 2 episodes of SVT: Patient is currently in normal sinus rhythm. This may be due to metabolic etiology.  No significant elevation in troponin. Now on oral amiodarone. Outpatient follow-up with cardiology Dr. Welton FlakesKhan. Appreciate cardiology input.  2. Suicidal ideation ECT scheduled for Friday. Will need transfer to behavioral health unit.  3. Hyperlipidemia: Continue Zocor  4. Essential hypertension: Continue metoprolol  5. Depression/psych issues: Continue Effexor, Zyprexa and lithium.TSH wnl Needs ECT. His all over body pain is somatization from depression.  6. Diabetes: Continue metformin and sliding scale insulin  7. Urinary retention: Flomax started.  Resolved  CODE STATUS: FULL  TOTAL TIME TAKING  CARE OF THIS PATIENT: 25 minutes .  Physical therapy is recommended skilled nursing facility.  But patient needs transfer to behavioral health unit.   Milagros Loll R M.D on 10/03/2015 at 10:15 AM  Between 7am to 6pm - Pager - [phone] After 6pm go to [email or web address] - password [email or web address] [facility]  [facility]  Office  [phone]  CC: Primary care physician; No PCP Per Patient  Note: This dictation was prepared with Dragon dictation along with smaller phrase technology. Any transcriptional errors that result from this process are unintentional.
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
## 2015-10-03 NOTE — Progress Notes (Signed)
Patient complaining of pain and pressure in lower abdomen. Patient bladder scanned. Found over 600cc's in bladder. MD notified. Orders received to do in and out catheter.

## 2015-10-04 ENCOUNTER — Inpatient Hospital Stay: Payer: Medicare Other

## 2015-10-04 ENCOUNTER — Inpatient Hospital Stay: Payer: Medicare Other | Admitting: Certified Registered Nurse Anesthetist

## 2015-10-04 ENCOUNTER — Encounter: Payer: Self-pay | Admitting: Anesthesiology

## 2015-10-04 ENCOUNTER — Other Ambulatory Visit: Payer: Self-pay | Admitting: *Deleted

## 2015-10-04 LAB — GLUCOSE, CAPILLARY
GLUCOSE-CAPILLARY: 103 mg/dL — AB (ref 65–99)
GLUCOSE-CAPILLARY: 96 mg/dL (ref 65–99)
Glucose-Capillary: 93 mg/dL (ref 65–99)
Glucose-Capillary: 114 mg/dL — ABNORMAL HIGH (ref 65–99)

## 2015-10-04 MED ORDER — SUCCINYLCHOLINE CHLORIDE 20 MG/ML IJ SOLN
120.0000 mg | Freq: Once | INTRAMUSCULAR | Status: AC
  Administered 2015-10-04: 120 mg via INTRAVENOUS
  Filled 2015-10-04: qty 6

## 2015-10-04 MED ORDER — SODIUM CHLORIDE 0.9 % IV SOLN
INTRAVENOUS | Status: AC
Start: 2015-10-04 — End: 2015-10-04

## 2015-10-04 MED ORDER — METHOHEXITAL SODIUM 100 MG/10ML IV SOSY
100.0000 mg | PREFILLED_SYRINGE | Freq: Once | INTRAVENOUS | Status: AC
  Administered 2015-10-04: 100 mg via INTRAVENOUS

## 2015-10-04 MED ORDER — ONDANSETRON HCL 4 MG/2ML IJ SOLN: 4.0000 mg | Freq: Once | INTRAMUSCULAR | Status: DC | PRN

## 2015-10-04 MED ORDER — FENTANYL CITRATE (PF) 100 MCG/2ML IJ SOLN: 25.0000 ug | INTRAMUSCULAR | Status: DC | PRN

## 2015-10-04 MED ORDER — DEXTROSE 5 % IV SOLN: INTRAVENOUS | Status: DC | PRN

## 2015-10-04 MED ORDER — SODIUM CHLORIDE 0.9 % IV SOLN
250.0000 mL | INTRAVENOUS | Status: DC
  Administered 2015-10-04: 10:00:00 via INTRAVENOUS
  Administered 2015-10-04: 250 mL via INTRAVENOUS

## 2015-10-04 MED ORDER — LIDOCAINE HCL (CARDIAC) 20 MG/ML IV SOLN
4.0000 mg | Freq: Once | INTRAVENOUS | Status: AC
  Administered 2015-10-04: 4 mg via INTRAVENOUS
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
## 2015-10-04 NOTE — Progress Notes (Signed)
Patient continues ECT with psych , next treatment is on Monday.   [facility]  PASRR # received [id number]  will expire 11/01/2015.  CSW will continue to follow patient for ongoing and discharge needs.  Sammuel Hineseborah Moore. Theresia MajorsLCSWA, MSW Clinical Social Work Department [phone] 1:41 PM

## 2015-10-04 NOTE — Anesthesia Preprocedure Evaluation (Addendum)
Anesthesia Evaluation  Patient identified by MRN, date of birth, ID band Patient awake    Reviewed: Allergy & Precautions, NPO status , Patient's Chart, lab work & pertinent test results  Airway Mallampati: III      Comment: beard Dental  (+) Poor Dentition   Pulmonary neg pulmonary ROS,    Pulmonary exam normal breath sounds clear to auscultation       Cardiovascular hypertension, Pt. on medications Normal cardiovascular exam+ dysrhythmias Supra Ventricular Tachycardia      Neuro/Psych Depression Bipolar Disorder Patient states that he cannot walk.Kyle [name].Kyle [name].peripheral neuropathy  Neuromuscular disease    GI/Hepatic negative GI ROS, Neg liver ROS,   Endo/Other    Renal/GU negative Renal ROS     Musculoskeletal negative musculoskeletal ROS (+)   Abdominal Normal abdominal exam  (+)   Peds  Hematology negative hematology ROS (+)   Anesthesia Other Findings Nonsustained Vtach after two episodes of SVT.Kyle [name]. Patient in NSR now.. Patient on amiodarone  Reproductive/Obstetrics                           Anesthesia Physical Anesthesia Plan  ASA: III  Anesthesia Plan: General   Post-op Pain Management:    Induction: Intravenous  Airway Management Planned: Mask  Additional Equipment:   Intra-op Plan:   Post-operative Plan:   Informed Consent: I have reviewed the patients History and Physical, chart, labs and discussed the procedure including the risks, benefits and alternatives for the proposed anesthesia with the patient or authorized representative who has indicated his/her understanding and acceptance.   Dental advisory given  Plan Discussed with: CRNA and Surgeon  Anesthesia Plan Comments:         Anesthesia Quick Evaluation

## 2015-10-04 NOTE — Anesthesia Procedure Notes (Signed)
Performed by: Malva CoganBEANE, Rayonna Heldman Pre-anesthesia Checklist: Patient identified, Patient being monitored, Timeout performed, Emergency Drugs available and Suction available Patient Re-evaluated:Patient Re-evaluated prior to inductionOxygen Delivery Method: Circle system utilized Preoxygenation: Pre-oxygenation with 100% oxygen Ventilation: Mask ventilation with difficulty, Oral airway inserted - appropriate to patient size and Two handed mask ventilation required Airway Equipment and Method: Bite block Dental Injury: Teeth and Oropharynx as per pre-operative assessment

## 2015-10-04 NOTE — Progress Notes (Signed)
   SUBJECTIVE: Pt in bed, ready to go for ECT.    Filed Vitals:   10/03/15 1601 10/03/15 1959 10/04/15 0410 10/04/15 0723  BP: 141/59 138/57 148/59 138/57  Pulse: 59 51 50 54  Temp: 98.1 F (36.7 C) 98.5 F (36.9 C) 97.6 F (36.4 C) 97.6 F (36.4 C)  TempSrc: Oral Oral Oral Oral  Resp:  18 18 16   Height:      Weight:      SpO2: 97% 98% 98% 97%    Intake/Output Summary (Last 24 hours) at 10/04/15 0853 Last data filed at 10/04/15 0646  Gross per 24 hour  Intake   5113 ml  Output   4400 ml  Net    713 ml    LABS: Basic Metabolic Panel: No results for input(s): NA, K, CL, CO2, GLUCOSE, BUN, CREATININE, CALCIUM, MG, PHOS in the last 72 hours. Liver Function Tests: No results for input(s): AST, ALT, ALKPHOS, BILITOT, PROT, ALBUMIN in the last 72 hours. No results for input(s): LIPASE, AMYLASE in the last 72 hours. CBC:  Recent Labs  10/02/15 1314  WBC 11.0*  HGB 11.7*  HCT 34.6*  MCV 94.9  PLT 307   Cardiac Enzymes: No results for input(s): CKTOTAL, CKMB, CKMBINDEX, TROPONINI in the last 72 hours. BNP: Invalid input(s): POCBNP D-Dimer: No results for input(s): DDIMER in the last 72 hours. Hemoglobin A1C: No results for input(s): HGBA1C in the last 72 hours. Fasting Lipid Panel: No results for input(s): CHOL, HDL, LDLCALC, TRIG, CHOLHDL, LDLDIRECT in the last 72 hours. Thyroid Function Tests: No results for input(s): TSH, T4TOTAL, T3FREE, THYROIDAB in the last 72 hours.  Invalid input(s): FREET3 Anemia Panel: No results for input(s): VITAMINB12, FOLATE, FERRITIN, TIBC, IRON, RETICCTPCT in the last 72 hours.   PHYSICAL EXAM General: Well developed, well nourished, in no acute distress HEENT: Normocephalic and atramatic Neck: No JVD.  Lungs: Clear bilaterally to auscultation and percussion. Heart: HRRR . Normal S1 and S2 without gallops or murmurs.  Abdomen: Bowel sounds are positive, abdomen soft and non-tender  Extremities: No clubbing, cyanosis or  edema.   ASSESSMENT AND PLAN:  1. NS VT: Continue amiodarone PO 400mg  daily. Pt will likely need NST as outpatient to evaluate cause of VT, although it is likely metabolic. Pt given outpatient f/u 11/22 at 2pm.   Patient and plan discussed with supervising provider, Dr. Adrian BlackwaterShaukat Khan, who agrees with above findings.   Alinda SierrasEileen A. Margarito CourserRomano, PA-C [facility]  10/04/2015 8:53 AM

## 2015-10-04 NOTE — Progress Notes (Signed)
Spoke with Dr. Toni Amendlapacs again informing him about that the IVC papers expired on 10/02/15. Dr. Toni Amendlapacs to address. Order received to discontinue order for involuntary commitment and Dr. Toni Amendlapacs states that he will handle the papers.

## 2015-10-04 NOTE — Procedures (Signed)
ECT SERVICES Physician's Interval Evaluation & Treatment Note  Patient Identification: Kyle [name] MRN:  [id number] Date of Evaluation:  10/04/2015 TX #: 1  MADRS: 30  MMSE: 26  P.E. Findings:  Patient is currently unable to walk and is using his hands poorly for what is presumed to be psychiatric reasons. Eating minimally only when encouraged. Lungs clear heart regular rate and rhythm no other specific findings.  Psychiatric Interval Note:  Patient is severely depressed with passive suicidal ideation and extreme lethargy and mental withdrawal but is oriented and able to give consent  Subjective:  Patient is a 69 y.o. male seen for evaluation for Electroconvulsive Therapy. No specific complaint  Treatment Summary:   []   Right Unilateral             [x]  Bilateral   % Energy : 1.0 ms, 60%   Impedance: 590 ohms  Seizure Energy Index: 2709 V squared  Postictal Suppression Index: 92%  Seizure Concordance Index: 71%  Medications  Pre Shock: Xylocaine 4 mg, Brevital 100 mg, succinylcholine 120 mg  Post Shock: None  Seizure Duration: 14 seconds by EMG, 18 seconds by EEG   Comments: Seizure length was inadequate. Next treatment anticipate decreasing Brevital dose to 80 mg while increasing energy load to 100%. Next treatment Monday   Lungs:  [x]   Clear to auscultation               []  Other:   Heart:    [x]   Regular rhythm             []  irregular rhythm    [x]   Previous H&P reviewed, patient examined and there are NO CHANGES                 []   Previous H&P reviewed, patient examined and there are changes noted.   Kyle RasmussenJohn Wyett Narine, MD 11/11/201610:48 AM

## 2015-10-04 NOTE — Progress Notes (Signed)
[facility] [place] [facility]   PATIENT NAME: Kyle LennoxMarvin [name]    MR#:  [id number]  DATE OF BIRTH:  02/19/1946  SUBJECTIVE:  Patient lying in bed. In no acute distress. Sitter at bedside.  Complains of pain all over.  Condom catheter placed due to incontinence. Urinary retention has resolved.  Had ECT earlier today.  REVIEW OF SYSTEMS:    Review of Systems  Constitutional: Positive for malaise/fatigue. Negative for fever and chills.  HENT: Negative for sore throat.   Eyes: Negative for blurred vision and pain.  Respiratory: Negative for cough, hemoptysis, shortness of breath and wheezing.   Cardiovascular: Negative for chest pain, palpitations and leg swelling.  Gastrointestinal: Negative for nausea, vomiting, abdominal pain, diarrhea and blood in stool.  Genitourinary: Negative for dysuria.  Musculoskeletal: Positive for myalgias, back pain, joint pain and neck pain.  Neurological: Positive for weakness. Negative for dizziness, tingling, tremors, sensory change, speech change and headaches.  Endo/Heme/Allergies: Does not bruise/bleed easily.  Psychiatric/Behavioral: Positive for depression. Negative for suicidal ideas and hallucinations. The patient is not nervous/anxious and does not have insomnia.     Tolerating Diet: Yes DRUG ALLERGIES:  No Known Allergies VITALS:  Blood pressure 135/62, pulse 58, temperature 97.4 F (36.3 C), temperature source Oral, resp. rate 18, height 5\' 8"  (1.727 m), weight 99.791 kg (220 lb), SpO2 95 %. PHYSICAL EXAMINATION:   Physical Exam  Constitutional: He is well-developed, well-nourished, and in no distress. No distress.  HENT:  Head: Normocephalic.  Eyes: No scleral icterus.  Neck: No JVD present. No tracheal deviation present.  Cardiovascular: Normal rate, regular rhythm and normal heart sounds.  Exam reveals no gallop and no friction rub.   No murmur heard. Pulmonary/Chest: Effort normal and breath  sounds normal. No respiratory distress. He has no wheezes. He has no rales. He exhibits no tenderness.  Abdominal: Soft. Bowel sounds are normal. He exhibits no distension and no mass. There is no tenderness. There is no rebound and no guarding.  Musculoskeletal: He exhibits no edema.  Skin: Skin is warm. No rash noted. No erythema.  Psychiatric:  depressed   LABORATORY PANEL:   CBC  Recent Labs Lab 10/02/15 1314  WBC 11.0*  HGB 11.7*  HCT 34.6*  PLT 307   ------------------------------------------------------------------------------------------------------------------  Chemistries   Recent Labs Lab 10/01/15 0504  NA 136  K 4.3  CL 102  CO2 29  GLUCOSE 98  BUN 8  CREATININE 0.66  CALCIUM 8.9   ------------------------------------------------------------------------------------------------------------------  Cardiac Enzymes No results for input(s): TROPONINI in the last 168 hours. ------------------------------------------------------------------------------------------------------------------  RADIOLOGY:  No results found.   ASSESSMENT AND PLAN:   69 year old male who presented with suicidal ideation and found to have SVT with nonsustained V. tach.   # Nonsustained V. tach after 2 episodes of SVT: Patient is currently in normal sinus rhythm. This may be due to metabolic etiology.  No significant elevation in troponin. Now on oral amiodarone. Outpatient follow-up with cardiology Dr. Welton FlakesKhan. Appreciate cardiology input.  # Suicidal ideation with severe depression ECT done today. Will need transfer to behavioral health unit eventually. Continue Effexor, Zyprexa and lithium.TSH wnl  # Hyperlipidemia: Continue Zocor  # Essential hypertension: Continue metoprolol  # Diabetes: Continue metformin and sliding scale insulin  # Urinary retention: Flomax started.  Resolved  CODE STATUS: FULL  TOTAL TIME TAKING CARE OF THIS PATIENT: 25 minutes .  Physical  therapy is recommended skilled nursing facility. But patient needs transfer to behavioral health  unit which is difficult if he cannot walk.   Milagros Loll R M.D on 10/04/2015 at 1:48 PM  Between 7am to 6pm - Pager - [phone] After 6pm go to [email or web address] - password [email or web address] [facility]  [place]  [facility]  Office  [phone]  CC: Primary care physician; No PCP Per Patient  Note: This dictation was prepared with Dragon dictation along with smaller phrase technology. Any transcriptional errors that result from this process are unintentional.

## 2015-10-04 NOTE — Progress Notes (Signed)
PT TO BE NPO AFTER MIDNIGHT FOR ECT ON MONDAY

## 2015-10-04 NOTE — Transfer of Care (Signed)
Immediate Anesthesia Transfer of Care Note  Patient: Kyle [name]  Procedure(s) Performed: * No procedures listed *  Patient Location: PACU  Anesthesia Type:General  Level of Consciousness: awake, alert  and oriented  Airway & Oxygen Therapy: Patient Spontanous Breathing and Patient connected to face mask oxygen  Post-op Assessment: Report given to RN and Post -op Vital signs reviewed and stable  Post vital signs: Reviewed and stable  Last Vitals:  Filed Vitals:   10/04/15 0849  BP: 131/61  Pulse: 57  Temp: 35.3 C  Resp: 8    Complications: No apparent anesthesia complications

## 2015-10-05 LAB — GLUCOSE, CAPILLARY
GLUCOSE-CAPILLARY: 103 mg/dL — AB (ref 65–99)
Glucose-Capillary: 98 mg/dL (ref 65–99)
Glucose-Capillary: 103 mg/dL — ABNORMAL HIGH (ref 65–99)
Glucose-Capillary: 103 mg/dL — ABNORMAL HIGH (ref 65–99)
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
## 2015-10-05 NOTE — Progress Notes (Signed)
Patient ID: Kyle [name], male   DOB: 1946/04/08, 69 y.o.   MRN: [id number] [facility] PROGRESS NOTE  PCP: No PCP Per Patient  HPI/Subjective: Patient complains of weakness and joint pains all over. He states his appetite is little bit better. He states his mood is a little bit better.  Objective: Filed Vitals:   10/05/15 0736  BP: 148/65  Pulse: 51  Temp: 98.5 F (36.9 C)  Resp: 18    Filed Weights   09/25/15 1334 10/04/15 0849  Weight: 99.791 kg (220 lb) 99.791 kg (220 lb)    ROS: ROS Exam: Physical Exam  HENT:  Nose: No mucosal edema.  Mouth/Throat: No oropharyngeal exudate or posterior oropharyngeal edema.  Eyes: Conjunctivae, EOM and lids are normal. Pupils are equal, round, and reactive to light.  Neck: No JVD present. Carotid bruit is not present. No edema present. No thyroid mass and no thyromegaly present.  Cardiovascular: S1 normal and S2 normal.  Exam reveals no gallop.   No murmur heard. Pulses:      Dorsalis pedis pulses are 2+ on the right side, and 2+ on the left side.  Respiratory: No respiratory distress. He has no wheezes. He has no rhonchi. He has no rales.  GI: Soft. Bowel sounds are normal. There is no tenderness.  Musculoskeletal:       Right knee: He exhibits decreased range of motion.       Left knee: He exhibits decreased range of motion.       Right ankle: He exhibits decreased range of motion and swelling.       Left ankle: He exhibits decreased range of motion and swelling.  When moving around his joints on his lower extremity he is very stiff.  Lymphadenopathy:    He has no cervical adenopathy.  Neurological: He is alert. No cranial nerve deficit.  Patient able to straight leg raise.  Skin: Skin is warm. No rash noted. Nails show no clubbing.  Psychiatric: He has a normal mood and affect.    Data Reviewed: Basic Metabolic Panel:  Recent Labs Lab 09/30/15 0235 10/01/15 0504  NA  --  136  K  --  4.3  CL  --  102   CO2  --  29  GLUCOSE  --  98  BUN  --  8  CREATININE 0.66 0.66  CALCIUM  --  8.9    Recent Labs Lab 09/30/15 1310  AMMONIA 21   CBC:  Recent Labs Lab 10/02/15 1314  WBC 11.0*  HGB 11.7*  HCT 34.6*  MCV 94.9  PLT 307    CBG:  Recent Labs Lab 10/04/15 1140 10/04/15 1612 10/04/15 1955 10/05/15 0738 10/05/15 1112  GLUCAP 103* 96 114* 98 103*    Recent Results (from the past 240 hour(s))  Culture, blood (routine x 2)     Status: None   Collection Time: 09/25/15  4:21 PM  Result Value Ref Range Status   Specimen Description BLOOD RIGHT ARM  Final   Special Requests BOTTLES DRAWN AEROBIC AND ANAEROBIC 5CC  Final   Culture NO GROWTH 5 DAYS  Final   Report Status 09/30/2015 FINAL  Final  Culture, blood (routine x 2)     Status: None   Collection Time: 09/25/15  4:25 PM  Result Value Ref Range Status   Specimen Description BLOOD LEFT HAND  Final   Special Requests BOTTLES DRAWN AEROBIC AND ANAEROBIC 5CC  Final   Culture NO GROWTH 5 DAYS  Final   Report Status 09/30/2015 FINAL  Final      Scheduled Meds: . amiodarone  400 mg Oral Daily  . aspirin  81 mg Oral Daily  . enoxaparin (LOVENOX) injection  40 mg Subcutaneous Q24H  . feeding supplement (ENSURE ENLIVE)  237 mL Oral BID BM  . gabapentin  300 mg Oral BID  . insulin aspart  0-5 Units Subcutaneous QHS  . insulin aspart  0-9 Units Subcutaneous TID WC  . lithium carbonate  900 mg Oral QHS  . LORazepam  1 mg Oral QHS  . metFORMIN  500 mg Oral BID WC  . metoprolol tartrate  25 mg Oral BID  . OLANZapine  10 mg Oral QHS  . OLANZapine  20 mg Oral QHS  . primidone  50 mg Oral QHS  . sodium chloride  3 mL Intravenous Q12H  . tamsulosin  0.4 mg Oral Daily  . venlafaxine XR  150 mg Oral Q breakfast   Continuous Infusions: . sodium chloride 250 mL (10/04/15 0945)    Assessment/Plan:  1. Weakness and joint pains all over- I will discontinue simvastatin. If it secondary to statin myopathy this should get  better in a few days. 2. Severe depression with suicidal ideation- psychiatry doing ECT treatments first treatment was Friday. Continue psychiatric medications as per psychiatry team. 3. Ventricular tachycardia- on amiodarone by cardiology 4. Essential hypertension on metoprolol 5. BPH on Flomax 6. Essential tremor on primidone  Code Status:     Code Status Orders        Start     Ordered   09/25/15 1729  Full code   Continuous     09/25/15 1729     Disposition Plan: To be determined  Consultants:  Cardiology  Psychiatry  Procedures:  ECT treatments  Time spent: 20 minutes  Alford HighlandWIETING, Ashawnti Tangen  [facility]

## 2015-10-05 NOTE — Progress Notes (Signed)
Physical Therapy Treatment Patient Details Name: Kyle [name] MRN: [id number] DOB: 11/06/1946 Today's Date: 10/05/2015    History of Present Illness Patient admitted with a depressive episode secondary to Bipolar disorder. He reports feeling his legs feeling weak and heavy "for three weeks". He has been seen by PT department several times in the last month and ambulated >200 feet each occassion.     PT Comments    Pt is able to try 3 separate standing efforts and on 2 of them was able to take a few very unsafe short steps with heavily leaning backwards and inability to follow instructions on top of already being frustrated and feeling weak.  Pt is not at all safe to walk (aka go down to behavioral med unit).  Follow Up Recommendations  SNF (vs geri-psych?)     Equipment Recommendations  Rolling walker with 5" wheels    Recommendations for Other Services       Precautions / Restrictions Precautions Precautions: Fall Precaution Comments: Sitter in room.  Restrictions Weight Bearing Restrictions: No    Mobility  Bed Mobility Overal bed mobility: Needs Assistance Bed Mobility: Supine to Sit     Supine to sit: Min assist Sit to supine: Mod assist   General bed mobility comments: VCs for rails & assist to shift LEs off bed & raise torso and to get LEs back into bed after standing  Transfers Overall transfer level: Needs assistance Equipment used: Rolling walker (2 wheeled) Transfers: Sit to/from Stand Sit to Stand: Min assist;Mod assist         General transfer comment: Pt leaning backwards and generally showing weakness and lack of motivation.  Pt able to rise on 3 seperate attempts, but is not steady or safe with any of them despite heavy cuing.   Ambulation/Gait Ambulation/Gait assistance: Max assist Ambulation Distance (Feet): 4 Feet Assistive device: Rolling walker (2 wheeled)       General Gait Details: 2 different ambulation efforts, on both pt was  unable to keep his weight forward w/o max assist.  He cannot get his hips forward over his feet and pulls up on the walker despite heavy assist to try to get him to lean weight on UEs.   Stairs            Wheelchair Mobility    Modified Rankin (Stroke Patients Only)       Balance                                    Cognition Arousal/Alertness: Lethargic Behavior During Therapy: Agitated;Impulsive;Restless                        Exercises General Exercises - Lower Extremity Ankle Circles/Pumps: AROM;10 reps Heel Slides: Strengthening;10 reps Hip ABduction/ADduction: Strengthening;10 reps Straight Leg Raises: AROM;10 reps    General Comments        Pertinent Vitals/Pain Pain Score:  (c/o vague back pain during some bed exercsies)    Home Living                      Prior Function            PT Goals (current goals can now be found in the care plan section) Progress towards PT goals: Progressing toward goals    Frequency  Min 2X/week    PT Plan Current plan remains appropriate  Co-evaluation             End of Session Equipment Utilized During Treatment: Gait belt Activity Tolerance:  (pt shows some effort but generally is frustrated) Patient left: with nursing/sitter in room;in bed     Time: 1204-1228 PT Time Calculation (min) (ACUTE ONLY): 24 min  Charges:  $Gait Training: 8-22 mins $Therapeutic Exercise: 8-22 mins                    G Codes:     Loran SentersGalen Harce Volden, PT, DPT [phone]  Malachi ProGalen R Abdurrahman Petersheim 10/05/2015, 1:28 PM

## 2015-10-05 NOTE — Anesthesia Postprocedure Evaluation (Signed)
  Anesthesia Post-op Note  Patient: Kyle [name]  Procedure(s) Performed: * No procedures listed *  Anesthesia type:General  Patient location: PACU  Post pain: Pain level controlled  Post assessment: Post-op Vital signs reviewed, Patient's Cardiovascular Status Stable, Respiratory Function Stable, Patent Airway and No signs of Nausea or vomiting  Post vital signs: Reviewed and stable  Last Vitals:  Filed Vitals:   10/05/15 0736  BP: 148/65  Pulse: 51  Temp: 36.9 C  Resp: 18    Level of consciousness: awake, alert  and patient cooperative  Complications: No apparent anesthesia complications

## 2015-10-05 NOTE — Care Management Important Message (Signed)
Important Message  Patient Details  Name: Kyle [name] MRN: [id number] Date of Birth: October 06, 1946   Medicare Important Message Given:  Yes    Naseer Hearn A, RN 10/05/2015, 1:06 PM

## 2015-10-05 NOTE — Progress Notes (Signed)
   SUBJECTIVE: Pt in bed, states whole body hurts.   Filed Vitals:   10/04/15 1613 10/04/15 2015 10/05/15 0414 10/05/15 0736  BP: 137/60 135/59 125/57 148/65  Pulse: 50 50 51 51  Temp: 98.4 F (36.9 C) 97.9 F (36.6 C) 98.4 F (36.9 C) 98.5 F (36.9 C)  TempSrc: Oral Oral Oral Oral  Resp: 18  18 18   Height:      Weight:      SpO2: 97% 99% 95% 95%    Intake/Output Summary (Last 24 hours) at 10/05/15 1028 Last data filed at 10/05/15 0800  Gross per 24 hour  Intake   1583 ml  Output   3600 ml  Net  -2017 ml    LABS: Basic Metabolic Panel: No results for input(s): NA, K, CL, CO2, GLUCOSE, BUN, CREATININE, CALCIUM, MG, PHOS in the last 72 hours. Liver Function Tests: No results for input(s): AST, ALT, ALKPHOS, BILITOT, PROT, ALBUMIN in the last 72 hours. No results for input(s): LIPASE, AMYLASE in the last 72 hours. CBC:  Recent Labs  10/02/15 1314  WBC 11.0*  HGB 11.7*  HCT 34.6*  MCV 94.9  PLT 307   Cardiac Enzymes: No results for input(s): CKTOTAL, CKMB, CKMBINDEX, TROPONINI in the last 72 hours. BNP: Invalid input(s): POCBNP D-Dimer: No results for input(s): DDIMER in the last 72 hours. Hemoglobin A1C: No results for input(s): HGBA1C in the last 72 hours. Fasting Lipid Panel: No results for input(s): CHOL, HDL, LDLCALC, TRIG, CHOLHDL, LDLDIRECT in the last 72 hours. Thyroid Function Tests: No results for input(s): TSH, T4TOTAL, T3FREE, THYROIDAB in the last 72 hours.  Invalid input(s): FREET3 Anemia Panel: No results for input(s): VITAMINB12, FOLATE, FERRITIN, TIBC, IRON, RETICCTPCT in the last 72 hours.   PHYSICAL EXAM General: Well developed, well nourished, in no acute distress HEENT: Normocephalic and atramatic Neck: No JVD.  Lungs: Clear bilaterally to auscultation and percussion. Heart: HRRR . Normal S1 and S2 without gallops or murmurs.  Abdomen: Bowel sounds are positive, abdomen soft and non-tender  Extremities: No clubbing, cyanosis  or edema.   ASSESSMENT AND PLAN:  1. NS VT: Continue amiodarone PO 400mg  daily. Pt will likely need NST as outpatient to evaluate cause of VT, although it is likely metabolic. Pt given outpatient f/u 11/22 at 2pm.    Patient and plan discussed with supervising provider, Dr. Adrian BlackwaterShaukat Khan, who agrees with above findings.   Alinda SierrasEileen A. Margarito CourserRomano, PA-C [facility]  10/05/2015 10:28 AM

## 2015-10-06 LAB — GLUCOSE, CAPILLARY
GLUCOSE-CAPILLARY: 108 mg/dL — AB (ref 65–99)
GLUCOSE-CAPILLARY: 95 mg/dL (ref 65–99)
Glucose-Capillary: 115 mg/dL — ABNORMAL HIGH (ref 65–99)

## 2015-10-06 NOTE — Progress Notes (Signed)
Patient ID: Kyle [name], male   DOB: 07/24/1946, 69 y.o.   MRN: [id number] [facility] PRO[facility]  PCP: No PCP Per Patient  HPI/Subjective: Patient feels okay. Still complains of weakness in his legs and joint pains.  Objective: Filed Vitals:   10/06/15 1129  BP: 129/53  Pulse: 58  Temp: 98.2 F (36.8 C)  Resp: 16    Filed Weights   09/25/15 1334 10/04/15 0849  Weight: 99.791 kg (220 lb) 99.791 kg (220 lb)    ROS: Review of Systems  Constitutional: Positive for malaise/fatigue. Negative for fever and chills.  Eyes: Negative for blurred vision.  Respiratory: Negative for cough and shortness of breath.   Cardiovascular: Negative for chest pain.  Gastrointestinal: Negative for nausea, vomiting, abdominal pain, diarrhea and constipation.  Genitourinary: Negative for dysuria.  Musculoskeletal: Positive for joint pain.  Neurological: Negative for dizziness and headaches.   Exam: Physical Exam  HENT:  Nose: No mucosal edema.  Mouth/Throat: No oropharyngeal exudate or posterior oropharyngeal edema.  Eyes: Conjunctivae, EOM and lids are normal. Pupils are equal, round, and reactive to light.  Neck: No JVD present. Carotid bruit is not present. No edema present. No thyroid mass and no thyromegaly present.  Cardiovascular: S1 normal and S2 normal.  Exam reveals no gallop.   No murmur heard. Pulses:      Dorsalis pedis pulses are 2+ on the right side, and 2+ on the left side.  Respiratory: No respiratory distress. He has no wheezes. He has no rhonchi. He has no rales.  GI: Soft. Bowel sounds are normal. There is no tenderness.  Musculoskeletal:       Right knee: He exhibits decreased range of motion.       Left knee: He exhibits decreased range of motion.       Right ankle: He exhibits decreased range of motion and swelling.       Left ankle: He exhibits decreased range of motion and swelling.  I was able to move around his joints a little bit easier  today. He is a little less stiff.  Lymphadenopathy:    He has no cervical adenopathy.  Neurological: He is alert. No cranial nerve deficit.  Patient able to straight leg raise.  Skin: Skin is warm. No rash noted. Nails show no clubbing.  Psychiatric: He has a normal mood and affect.    Data Reviewed: Basic Metabolic Panel:  Recent Labs Lab 09/30/15 0235 10/01/15 0504  NA  --  136  K  --  4.3  CL  --  102  CO2  --  29  GLUCOSE  --  98  BUN  --  8  CREATININE 0.66 0.66  CALCIUM  --  8.9    Recent Labs Lab 09/30/15 1310  AMMONIA 21   CBC:  Recent Labs Lab 10/02/15 1314  WBC 11.0*  HGB 11.7*  HCT 34.6*  MCV 94.9  PLT 307    CBG:  Recent Labs Lab 10/05/15 1112 10/05/15 1603 10/05/15 2118 10/06/15 0729 10/06/15 1131  GLUCAP 103* 103* 103* 108* 115*   Scheduled Meds: . amiodarone  400 mg Oral Daily  . aspirin  81 mg Oral Daily  . enoxaparin (LOVENOX) injection  40 mg Subcutaneous Q24H  . feeding supplement (ENSURE ENLIVE)  237 mL Oral BID BM  . gabapentin  300 mg Oral BID  . insulin aspart  0-5 Units Subcutaneous QHS  . insulin aspart  0-9 Units Subcutaneous TID WC  . lithium  carbonate  900 mg Oral QHS  . LORazepam  1 mg Oral QHS  . metFORMIN  500 mg Oral BID WC  . metoprolol tartrate  25 mg Oral BID  . OLANZapine  10 mg Oral QHS  . OLANZapine  20 mg Oral QHS  . primidone  50 mg Oral QHS  . sodium chloride  3 mL Intravenous Q12H  . tamsulosin  0.4 mg Oral Daily  . venlafaxine XR  150 mg Oral Q breakfast   Continuous Infusions: . sodium chloride 250 mL (10/04/15 0945)    Assessment/Plan:  1. Weakness and joint pains all over- I stopped simvastatin yesterday. 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 2. Severe depression with suicidal ideation- psychiatry doing ECT treatments first treatment was Friday. Continue psychiatric medications as per psychiatry team. 3. Ventricular tachycardia- on amiodarone by  cardiology 4. Essential hypertension on metoprolol 5. BPH on Flomax 6. Essential tremor on primidone  Code Status:     Code Status Orders        Start     Ordered   09/25/15 1729  Full code   Continuous     09/25/15 1729     Disposition Plan: To be determined  Consultants:  Cardiology  Psychiatry  Procedures:  ECT treatments  Time spent: 20 minutes  Alford Highland  [facility]

## 2015-10-06 NOTE — Progress Notes (Signed)
   SUBJECTIVE: Pt states he is "pretty good" today and states he thinks he is getting stronger with therapy.    Filed Vitals:   10/05/15 1602 10/05/15 2004 10/06/15 0450 10/06/15 0735  BP: 137/56 139/53 143/55 154/72  Pulse: 55 54 56 68  Temp: 97.8 F (36.6 C) 97.5 F (36.4 C) 98.4 F (36.9 C) 98.7 F (37.1 C)  TempSrc: Oral Oral Oral Oral  Resp: 18 18 18 18   Height:      Weight:      SpO2: 97% 100% 95% 96%    Intake/Output Summary (Last 24 hours) at 10/06/15 0946 Last data filed at 10/06/15 0905  Gross per 24 hour  Intake   1200 ml  Output   6025 ml  Net  -4825 ml    LABS: Basic Metabolic Panel: No results for input(s): NA, K, CL, CO2, GLUCOSE, BUN, CREATININE, CALCIUM, MG, PHOS in the last 72 hours. Liver Function Tests: No results for input(s): AST, ALT, ALKPHOS, BILITOT, PROT, ALBUMIN in the last 72 hours. No results for input(s): LIPASE, AMYLASE in the last 72 hours. CBC: No results for input(s): WBC, NEUTROABS, HGB, HCT, MCV, PLT in the last 72 hours. Cardiac Enzymes: No results for input(s): CKTOTAL, CKMB, CKMBINDEX, TROPONINI in the last 72 hours. BNP: Invalid input(s): POCBNP D-Dimer: No results for input(s): DDIMER in the last 72 hours. Hemoglobin A1C: No results for input(s): HGBA1C in the last 72 hours. Fasting Lipid Panel: No results for input(s): CHOL, HDL, LDLCALC, TRIG, CHOLHDL, LDLDIRECT in the last 72 hours. Thyroid Function Tests: No results for input(s): TSH, T4TOTAL, T3FREE, THYROIDAB in the last 72 hours.  Invalid input(s): FREET3 Anemia Panel: No results for input(s): VITAMINB12, FOLATE, FERRITIN, TIBC, IRON, RETICCTPCT in the last 72 hours.   PHYSICAL EXAM General: Well developed, well nourished, in no acute distress HEENT: Normocephalic and atramatic Neck: No JVD.  Lungs: Clear bilaterally to auscultation and percussion. Heart: HRRR . Normal S1 and S2 without gallops or murmurs.  Abdomen: Bowel sounds are positive, abdomen soft  and non-tender  Extremities: No clubbing, cyanosis or edema.   ASSESSMENT AND PLAN:  1. NS VT: Continue amiodarone PO 400mg  daily. Pt will likely need NST as outpatient to evaluate cause of VT, although it is likely metabolic. Pt given outpatient f/u 11/22 at 2pm.   Patient and plan discussed with supervising provider, Dr. Adrian BlackwaterShaukat Khan, who agrees with above findings.   Alinda SierrasEileen A. Margarito CourserRomano, PA-C [facility]  10/06/2015 9:46 AM

## 2015-10-06 NOTE — Progress Notes (Signed)
Problems with dining services not delivering patients Ensure. [place] Patient did get his 10am Ensure. It was delivered around 1pm and he still has not received his 3pm.

## 2015-10-06 NOTE — Consult Note (Signed)
  Psychiatry: Follow-up for this 69 year old man with bipolar disorder depressed. Receiving ECT. On interview today the patient says that he feels he is coming along a little bit. He has been able to stand up briefly. His affect and mood are still blunted. He seems slightly optimistic. Denies acute suicidal ideation.  Tolerating ECT well. He expresses optimism. Plan from my standpoint is to continue with ECT with next treatment scheduled for tomorrow morning. No other change to medicine for the time being. Physical therapy to continue working with him as soon as we can get him stable on his feet we can consider transfer downstairs. 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

## 2015-10-07 ENCOUNTER — Other Ambulatory Visit: Payer: Self-pay

## 2015-10-07 ENCOUNTER — Inpatient Hospital Stay: Payer: Medicare Other | Admitting: Anesthesiology

## 2015-10-07 ENCOUNTER — Inpatient Hospital Stay: Payer: Medicare Other

## 2015-10-07 LAB — GLUCOSE, CAPILLARY
GLUCOSE-CAPILLARY: 135 mg/dL — AB (ref 65–99)
GLUCOSE-CAPILLARY: 108 mg/dL — AB (ref 65–99)
Glucose-Capillary: 108 mg/dL — ABNORMAL HIGH (ref 65–99)
Glucose-Capillary: 102 mg/dL — ABNORMAL HIGH (ref 65–99)

## 2015-10-07 MED ORDER — SUCCINYLCHOLINE CHLORIDE 20 MG/ML IJ SOLN
120.0000 mg | Freq: Once | INTRAMUSCULAR | Status: AC
  Administered 2015-10-07: 120 mg via INTRAVENOUS

## 2015-10-07 MED ORDER — METHOHEXITAL SODIUM 100 MG/10ML IV SOSY
80.0000 mg | PREFILLED_SYRINGE | Freq: Once | INTRAVENOUS | Status: AC
  Administered 2015-10-07: 80 mg via INTRAVENOUS

## 2015-10-07 MED ORDER — FENTANYL CITRATE (PF) 100 MCG/2ML IJ SOLN: 25.0000 ug | INTRAMUSCULAR | Status: DC | PRN

## 2015-10-07 MED ORDER — LIDOCAINE HCL (CARDIAC) 20 MG/ML IV SOLN
4.0000 mg | Freq: Once | INTRAVENOUS | Status: AC
  Administered 2015-10-07: 4 mg via INTRAVENOUS

## 2015-10-07 MED ORDER — FLEET ENEMA 7-19 GM/118ML RE ENEM
1.0000 | ENEMA | Freq: Once | RECTAL | Status: AC
  Administered 2015-10-07: 1 via RECTAL

## 2015-10-07 MED ORDER — ONDANSETRON HCL 4 MG/2ML IJ SOLN: 4.0000 mg | Freq: Once | INTRAMUSCULAR | Status: DC | PRN

## 2015-10-07 MED ORDER — SODIUM CHLORIDE 0.9 % IV SOLN
250.0000 mL | Freq: Once | INTRAVENOUS | Status: AC
  Administered 2015-10-07: 250 mL via INTRAVENOUS

## 2015-10-07 MED ORDER — DOCUSATE SODIUM 100 MG PO CAPS
100.0000 mg | ORAL_CAPSULE | Freq: Two times a day (BID) | ORAL | Status: DC
  Administered 2015-10-07 – 2015-10-14 (×14): 100 mg via ORAL
  Filled 2015-10-07 (×15): qty 1

## 2015-10-07 NOTE — Progress Notes (Signed)
Patient ID: Kyle [name], male   DOB: 22-Oct-1946, 69 y.o.   MRN: [id number] [facility] PROGRESS NOTE  PCP: No PCP Per Patient  HPI/Subjective: Patient still feeling weakness in his legs and some joint pain. He answers all questions appropriately. States he has not had a bowel movement in a few days but the nurse just told him that.  Objective: Filed Vitals:   10/07/15 1216  BP: 139/72  Pulse: 74  Temp: 98.2 F (36.8 C)  Resp: 18    Filed Weights   09/25/15 1334 10/04/15 0849 10/07/15 0855  Weight: 99.791 kg (220 lb) 99.791 kg (220 lb) 93.804 kg (206 lb 12.8 oz)    ROS: Review of Systems  Constitutional: Positive for malaise/fatigue. Negative for fever and chills.  Eyes: Negative for blurred vision.  Respiratory: Negative for cough and shortness of breath.   Cardiovascular: Negative for chest pain.  Gastrointestinal: Positive for constipation. Negative for nausea, vomiting, abdominal pain and diarrhea.  Genitourinary: Negative for dysuria.  Musculoskeletal: Positive for joint pain.  Neurological: Negative for dizziness and headaches.   Exam: Physical Exam  HENT:  Nose: No mucosal edema.  Mouth/Throat: No oropharyngeal exudate or posterior oropharyngeal edema.  Eyes: Conjunctivae, EOM and lids are normal. Pupils are equal, round, and reactive to light.  Neck: No JVD present. Carotid bruit is not present. No edema present. No thyroid mass and no thyromegaly present.  Cardiovascular: S1 normal and S2 normal.  Exam reveals no gallop.   No murmur heard. Pulses:      Dorsalis pedis pulses are 2+ on the right side, and 2+ on the left side.  Respiratory: No respiratory distress. He has no wheezes. He has no rhonchi. He has no rales.  GI: Soft. Bowel sounds are normal. There is no tenderness.  Musculoskeletal:       Right knee: He exhibits decreased range of motion.       Left knee: He exhibits decreased range of motion.       Right ankle: He exhibits  decreased range of motion and swelling.       Left ankle: He exhibits decreased range of motion and swelling.  I was able to move around his joints a little bit easier today. He is a little less stiff.  Lymphadenopathy:    He has no cervical adenopathy.  Neurological: He is alert. No cranial nerve deficit.  Patient able to straight leg raise.  Skin: Skin is warm. No rash noted. Nails show no clubbing.  Psychiatric: He has a normal mood and affect.    Data Reviewed: Basic Metabolic Panel:  Recent Labs Lab 10/01/15 0504  NA 136  K 4.3  CL 102  CO2 29  GLUCOSE 98  BUN 8  CREATININE 0.66  CALCIUM 8.9   CBC:  Recent Labs Lab 10/02/15 1314  WBC 11.0*  HGB 11.7*  HCT 34.6*  MCV 94.9  PLT 307    CBG:  Recent Labs Lab 10/06/15 0729 10/06/15 1131 10/06/15 2100 10/07/15 0749 10/07/15 1226  GLUCAP 108* 115* 95 108* 135*   Scheduled Meds: . amiodarone  400 mg Oral Daily  . aspirin  81 mg Oral Daily  . docusate sodium  100 mg Oral BID  . enoxaparin (LOVENOX) injection  40 mg Subcutaneous Q24H  . gabapentin  300 mg Oral BID  . insulin aspart  0-5 Units Subcutaneous QHS  . insulin aspart  0-9 Units Subcutaneous TID WC  . lithium carbonate  900 mg Oral QHS  .  metFORMIN  500 mg Oral BID WC  . metoprolol tartrate  25 mg Oral BID  . OLANZapine  20 mg Oral QHS  . primidone  50 mg Oral QHS  . sodium chloride  3 mL Intravenous Q12H  . tamsulosin  0.4 mg Oral Daily  . venlafaxine XR  150 mg Oral Q breakfast    Assessment/Plan:  1. Weakness and joint pains all over- I stopped simvastatin on Saturday. 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 2. Severe depression with suicidal ideation- psychiatry doing ECT treatments. Second treatment was today. Continue psychiatric medications as per psychiatry team. 3. Ventricular tachycardia- on amiodarone by cardiology 4. Essential hypertension on metoprolol 5. BPH on Flomax 6. Essential tremor on  primidone  Code Status:     Code Status Orders        Start     Ordered   09/25/15 1729  Full code   Continuous     09/25/15 1729     Disposition Plan: To be determined  Consultants:  Cardiology  Psychiatry  Procedures:  ECT treatments  Time spent: 20 minutes  Kyle [name], Kyle [name]  [facility]

## 2015-10-07 NOTE — Progress Notes (Signed)
Pt did have ECT today.  He is moving his leg and arms.  Tremors when raising arms or holding food and dring. Sitter at bedside
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
## 2015-10-07 NOTE — Progress Notes (Signed)
lovenox sq 

## 2015-10-07 NOTE — Procedures (Signed)
ECT SERVICES Physician's Interval Evaluation & Treatment Note  Patient Identification: Kyle JacquetMarvin D Styron MRN:  [id number] Date of Evaluation:  10/07/2015 TX #: 2  MADRS:   MMSE:   P.E. Findings:  Physically starting to get more ability back in hands and feet  Psychiatric Interval Note:  No real interval change  Subjective:  Patient is a 69 y.o. male seen for evaluation for Electroconvulsive Therapy. No specific new complaints  Treatment Summary:   []   Right Unilateral             [x]  Bilateral   % Energy : 1.0 ms, 100%   Impedance: 610 ohms  Seizure Energy Index: 3430 V squared  Postictal Suppression Index: Less than 10%  Seizure Concordance Index: 96%  Medications  Pre Shock: Xylocaine 4 mg, Brevital 80 mg, and neck teen 120 mg  Post Shock: None  Seizure Duration: 12 seconds by EMG, 20 seconds by EEG   Comments: Patient will be switched to ketamine 100 mg his primary and aesthetic for next treatment.   Lungs:  [x]   Clear to auscultation               []  Other:   Heart:    [x]   Regular rhythm             []  irregular rhythm    [x]   Previous H&P reviewed, patient examined and there are NO CHANGES                 []   Previous H&P reviewed, patient examined and there are changes noted.   Mordecai RasmussenJohn Clapacs, MD 11/14/201610:59 AM

## 2015-10-07 NOTE — Progress Notes (Signed)
Pt still has not had a BM.  He is requesting something to get his bowels moving. MD Eliane DecreeS Patel ordered a fleets enema

## 2015-10-07 NOTE — Progress Notes (Signed)
Pt just left for ECT.  Transported by orderly and sitter

## 2015-10-07 NOTE — Progress Notes (Signed)
Physical Therapy Treatment Patient Details Name: Kyle [name] MRN: [id number] DOB: 11-05-1946 Today's Date: 10/07/2015    History of Present Illness Patient admitted with a depressive episode secondary to Bipolar disorder. He reports feeling his legs feeling weak and heavy "for three weeks". He has been seen by PT department several times in the last month and ambulated >200 feet each occassion.     PT Comments    Patient demonstrates significant improvement in independence with bed mobility and sit to stand transfers during this session. Once in standing he is self limiting secondary to pain in his "back, legs, and feet". Of note he has bilateral UE tremors in standing leading him to use the RW poorly and minimal safety noted. Patient continues to limit himself with ambulation and is unable to demonstrate safe ambulation at this time.   Follow Up Recommendations  SNF (vs. geri-psych?)     Equipment Recommendations  Rolling walker with 5" wheels    Recommendations for Other Services       Precautions / Restrictions Precautions Precautions: Fall Precaution Comments: Sitter in room.  Restrictions Weight Bearing Restrictions: No    Mobility  Bed Mobility Overal bed mobility: Needs Assistance Bed Mobility: Supine to Sit;Sit to Supine     Supine to sit: Min assist Sit to supine: Min guard   General bed mobility comments: Patient demonstrates improvement in bed mobility requiring assistance to bring his trunk upright, he is now able to transfer sit to supine without assistance.   Transfers Overall transfer level: Needs assistance Equipment used: Rolling walker (2 wheeled) Transfers: Sit to/from Stand Sit to Stand: Min assist         General transfer comment: Sit to stand attempted 2x, patien tbegan complaining of pain and weakness in his back and legs and expressed desire to return to supine. He stated he would complete 2 attempts and no more.    Ambulation/Gait Ambulation/Gait assistance: Min assist Ambulation Distance (Feet): 2 Feet Assistive device: Rolling walker (2 wheeled)     Gait velocity interpretation: Below normal speed for age/gender General Gait Details: 2 lateral steps to his right were performed with heavy encouragement from PT. He begins to have tremors in his UEs and is unable to provide support through them during ambulation through RW. He is very unsteady on his feet currently.    Stairs            Wheelchair Mobility    Modified Rankin (Stroke Patients Only)       Balance Overall balance assessment: Needs assistance Sitting-balance support: Feet supported;Bilateral upper extremity supported Sitting balance-Leahy Scale: Fair Sitting balance - Comments: Pt leaning to the R during session complaining of wanting to lay down and not participate with therapy. Able to sit upright after being convinced by PT this would help in his return home.  Postural control: Right lateral lean Standing balance support: Bilateral upper extremity supported Standing balance-Leahy Scale: Poor Standing balance comment: No buckling noted, though tremors through his UEs noted and therefore was unable to adequately provide support through RW.                     Cognition Arousal/Alertness: Awake/alert Behavior During Therapy: Flat affect;Agitated Overall Cognitive Status: No family/caregiver present to determine baseline cognitive functioning                      Exercises General Exercises - Lower Extremity Gluteal Sets: AROM;Both;10 reps Heel Slides: AROM;5 reps;Both Hip  ABduction/ADduction: AROM;Both;5 reps Straight Leg Raises: AROM;Both;5 reps    General Comments        Pertinent Vitals/Pain Pain Assessment:  (Patient begins complaining of pain from his back through his hips down to his legs during sitting and standing, only relieved when laying in bed. ) Pain Intervention(s): Limited  activity within patient's tolerance;Monitored during session    Home Living                      Prior Function            PT Goals (current goals can now be found in the care plan section) Acute Rehab PT Goals PT Goal Formulation: Patient unable to participate in goal setting Time For Goal Achievement: 10/11/15 Potential to Achieve Goals: Fair Progress towards PT goals: Progressing toward goals    Frequency  Min 2X/week    PT Plan Current plan remains appropriate    Co-evaluation             End of Session Equipment Utilized During Treatment: Gait belt Activity Tolerance:  (Patient appears to be self limiting secondary to pain/decreased motivation to work with PT) Patient left: with nursing/sitter in room;in bed     Time: 1556-1610 PT Time Calculation (min) (ACUTE ONLY): 14 min  Charges:  $Therapeutic Exercise: 8-22 mins                    G Codes:      Kerin RansomPatrick A Maddelyn Rocca, PT, DPT    10/07/2015, 4:31 PM

## 2015-10-07 NOTE — Transfer of Care (Signed)
Immediate Anesthesia Transfer of Care Note  Patient: Kyle [name]  Procedure(s) Performed: * No procedures listed *  Patient Location: PACU  Anesthesia Type:General  Level of Consciousness: awake and patient cooperative  Airway & Oxygen Therapy: Patient Spontanous Breathing and Patient connected to face mask oxygen  Post-op Assessment: Report given to RN, Post -op Vital signs reviewed and stable and Patient moving all extremities X 4  Post vital signs: Reviewed and stable  Last Vitals:  Filed Vitals:   10/07/15 1118  BP: 143/75  Pulse: 75  Temp: 36.9 C  Resp: 16    Complications: No apparent anesthesia complications

## 2015-10-07 NOTE — OR Nursing (Signed)
Incontinent large amount of urine, cleansed.  Returned to floor via bed.

## 2015-10-07 NOTE — Consult Note (Signed)
  Psychiatry: Follow-up for this 69 year old man with bipolar disorder depressed. Patient says he is feeling a little better today. He seems a little bit more energetic although still pretty lethargic and still not getting out of bed. He had ECT today which went fine without complications although he has very short seizures.  Anticipate continuing his current medicine and continuing ECT with next treatment scheduled for Wednesday. I am optimistic that we may be only get his energy better so that he can come downstairs. Encourage his work with physical therapy. No change to diagnosis.

## 2015-10-07 NOTE — Care Management Important Message (Signed)
Important Message  Patient Details  Name: Kyle [name] MRN: [id number] Date of Birth: 10-11-1946   Medicare Important Message Given:  Yes    Haelee Bolen A, RN 10/07/2015, 10:04 AM

## 2015-10-07 NOTE — Care Management Note (Signed)
Case Management Note  Patient Details  Name: Suezanne JacquetMarvin D Lessig MRN: [id number] Date of Birth: 1946-08-27  Subjective/Objective:      Received ECT today. Tentative discharge [facility] after [name] is able to resume walking. PT consult. Dr Toni Amendlapacs is on the unit seeing Mr Jenson post-ECT.               Action/Plan:   Expected Discharge Date:                  Expected Discharge Plan:     In-House Referral:  Clinical Social Work  Discharge planning Services  CM Consult  Post Acute Care Choice:  Home Health, Durable Medical Equipment Choice offered to:  Patient  DME Arranged:    DME Agency:     HH Arranged:    HH Agency:     Status of Service:  In process, will continue to follow  Medicare Important Message Given:  Yes Date Medicare IM Given:    Medicare IM give by:    Date Additional Medicare IM Given:    Additional Medicare Important Message give by:     If discussed at Long Length of Stay Meetings, dates discussed:    Additional Comments:  Keyontae Huckeby A, RN 10/07/2015, 3:34 PM

## 2015-10-07 NOTE — Anesthesia Postprocedure Evaluation (Signed)
  Anesthesia Post-op Note  Patient: Kyle [name]  Procedure(s) Performed: * No procedures listed *  Anesthesia type:General  Patient location: PACU  Post pain: Pain level controlled  Post assessment: Post-op Vital signs reviewed, Patient's Cardiovascular Status Stable, Respiratory Function Stable, Patent Airway and No signs of Nausea or vomiting  Post vital signs: Reviewed and stable  Last Vitals:  Filed Vitals:   10/07/15 1216  BP: 139/72  Pulse: 74  Temp: 36.8 C  Resp: 18    Level of consciousness: awake, alert  and patient cooperative  Complications: No apparent anesthesia complications

## 2015-10-07 NOTE — Anesthesia Preprocedure Evaluation (Addendum)
Anesthesia Evaluation  Patient identified by MRN, date of birth, ID band Patient awake    Reviewed: Allergy & Precautions, NPO status , Patient's Chart, lab work & pertinent test results  Airway Mallampati: III      Comment: beard Dental  (+) Poor Dentition   Pulmonary neg pulmonary ROS,    Pulmonary exam normal breath sounds clear to auscultation       Cardiovascular hypertension, Pt. on medications Normal cardiovascular exam+ dysrhythmias Supra Ventricular Tachycardia      Neuro/Psych PSYCHIATRIC DISORDERS Depression Bipolar Disorder Patient states that he cannot walk.Marland Kitchen.Marland Kitchen.peripheral neuropathy  Neuromuscular disease    GI/Hepatic negative GI ROS, Neg liver ROS,   Endo/Other    Renal/GU negative Renal ROS     Musculoskeletal negative musculoskeletal ROS (+)   Abdominal Normal abdominal exam  (+)   Peds  Hematology negative hematology ROS (+)   Anesthesia Other Findings Nonsustained Vtach after two episodes of SVT.Marland Kitchen. Patient in NSR now.. Patient on amiodarone  Reproductive/Obstetrics                            Anesthesia Physical Anesthesia Plan  ASA: III  Anesthesia Plan: General   Post-op Pain Management:    Induction: Intravenous  Airway Management Planned: Mask  Additional Equipment:   Intra-op Plan:   Post-operative Plan:   Informed Consent: I have reviewed the patients History and Physical, chart, labs and discussed the procedure including the risks, benefits and alternatives for the proposed anesthesia with the patient or authorized representative who has indicated his/her understanding and acceptance.   Dental advisory given  Plan Discussed with: CRNA and Surgeon  Anesthesia Plan Comments:         Anesthesia Quick Evaluation

## 2015-10-07 NOTE — Progress Notes (Signed)
   SUBJECTIVE: Pt feeling okay, no chest pain   Filed Vitals:   10/07/15 1147 10/07/15 1216 10/07/15 1546 10/07/15 1550  BP: 134/74 139/72 143/58   Pulse: 68 74 57 59  Temp:  98.2 F (36.8 C) 98.8 F (37.1 C)   TempSrc:  Oral Oral   Resp: 16 18 20    Height:      Weight:      SpO2:  93%      Intake/Output Summary (Last 24 hours) at 10/07/15 1637 Last data filed at 10/07/15 1543  Gross per 24 hour  Intake   2130 ml  Output   5625 ml  Net  -3495 ml    LABS: Basic Metabolic Panel: No results for input(s): NA, K, CL, CO2, GLUCOSE, BUN, CREATININE, CALCIUM, MG, PHOS in the last 72 hours. Liver Function Tests: No results for input(s): AST, ALT, ALKPHOS, BILITOT, PROT, ALBUMIN in the last 72 hours. No results for input(s): LIPASE, AMYLASE in the last 72 hours. CBC: No results for input(s): WBC, NEUTROABS, HGB, HCT, MCV, PLT in the last 72 hours. Cardiac Enzymes: No results for input(s): CKTOTAL, CKMB, CKMBINDEX, TROPONINI in the last 72 hours. BNP: Invalid input(s): POCBNP D-Dimer: No results for input(s): DDIMER in the last 72 hours. Hemoglobin A1C: No results for input(s): HGBA1C in the last 72 hours. Fasting Lipid Panel: No results for input(s): CHOL, HDL, LDLCALC, TRIG, CHOLHDL, LDLDIRECT in the last 72 hours. Thyroid Function Tests: No results for input(s): TSH, T4TOTAL, T3FREE, THYROIDAB in the last 72 hours.  Invalid input(s): FREET3 Anemia Panel: No results for input(s): VITAMINB12, FOLATE, FERRITIN, TIBC, IRON, RETICCTPCT in the last 72 hours.  PHYSICAL EXAM General: Well developed, well nourished, in no acute distress HEENT: Normocephalic and atramatic Neck: No JVD.  Lungs: Clear bilaterally to auscultation and percussion. Heart: HRRR . Normal S1 and S2 without gallops or murmurs.  Abdomen: Bowel sounds are positive, abdomen soft and non-tender  Extremities: No clubbing, cyanosis or edema.   ASSESSMENT AND PLAN:  1. NS VT: Continue amiodarone PO  400mg  daily. Pt will likely need NST as outpatient to evaluate cause of VT, although it is likely metabolic. Pt given outpatient f/u 11/22 at 2pm.    Patient and plan discussed with supervising provider, Dr. Adrian BlackwaterShaukat Khan, who agrees with above findings.   Alinda SierrasEileen A. Margarito CourserRomano, PA-C [facility]  10/07/2015 4:37 PM

## 2015-10-07 NOTE — Progress Notes (Signed)
Case discussed with Dr. Toni Amendlapacs. ECT treatment will continue. [facility] [place]. Clinical Child psychotherapistocial Worker (CSW) contacted [employer] admissions coordinator at [facility] and updated him on patient's status. CSW will continue to follow and assist as needed.   Jetta LoutBailey Morgan, LCSWA [phone]

## 2015-10-08 LAB — GLUCOSE, CAPILLARY
GLUCOSE-CAPILLARY: 103 mg/dL — AB (ref 65–99)
Glucose-Capillary: 91 mg/dL (ref 65–99)
Glucose-Capillary: 139 mg/dL — ABNORMAL HIGH (ref 65–99)
Glucose-Capillary: 101 mg/dL — ABNORMAL HIGH (ref 65–99)

## 2015-10-08 MED ORDER — VENLAFAXINE HCL ER 75 MG PO CP24
300.0000 mg | ORAL_CAPSULE | Freq: Every day | ORAL | Status: DC
  Administered 2015-10-09 – 2015-10-13 (×5): 300 mg via ORAL
  Filled 2015-10-08 (×5): qty 4

## 2015-10-08 MED ORDER — CYANOCOBALAMIN 1000 MCG/ML IJ SOLN
1000.0000 ug | Freq: Once | INTRAMUSCULAR | Status: AC
  Administered 2015-10-08: 1000 ug via INTRAMUSCULAR
  Filled 2015-10-08: qty 1
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
## 2015-10-08 NOTE — Progress Notes (Signed)
Patient ID: Kyle JacquetMarvin D [name], male   DOB: February 27, 1946, 69 y.o.   MRN: [id number] [facility] PRO[facility]ESS [facility]  PCP: No PCP Per Patient  HPI/Subjective: Patient feeling about the same. His legs are still weak and joint still hurt.  Objective: Filed Vitals:   10/08/15 1210  BP: 136/54  Pulse: 52  Temp: 97.7 F (36.5 C)  Resp: 17    Filed Weights   09/25/15 1334 10/04/15 0849 10/07/15 0855  Weight: 99.791 kg (220 lb) 99.791 kg (220 lb) 93.804 kg (206 lb 12.8 oz)    ROS: Review of Systems  Constitutional: Positive for malaise/fatigue. Negative for fever and chills.  Eyes: Negative for blurred vision.  Respiratory: Negative for cough and shortness of breath.   Cardiovascular: Negative for chest pain.  Gastrointestinal: Negative for nausea, vomiting, abdominal pain, diarrhea and constipation.  Genitourinary: Negative for dysuria.  Musculoskeletal: Positive for joint pain.  Neurological: Negative for dizziness and headaches.   Exam: Physical Exam  HENT:  Nose: No mucosal edema.  Mouth/Throat: No oropharyngeal exudate or posterior oropharyngeal edema.  Eyes: Conjunctivae, EOM and lids are normal. Pupils are equal, round, and reactive to light.  Neck: No JVD present. Carotid bruit is not present. No edema present. No thyroid mass and no thyromegaly present.  Cardiovascular: S1 normal and S2 normal.  Exam reveals no gallop.   No murmur heard. Pulses:      Dorsalis pedis pulses are 2+ on the right side, and 2+ on the left side.  Respiratory: No respiratory distress. He has no wheezes. He has no rhonchi. He has no rales.  GI: Soft. Bowel sounds are normal. There is no tenderness.  Musculoskeletal:       Right knee: He exhibits decreased range of motion.       Left knee: He exhibits decreased range of motion.       Right ankle: He exhibits decreased range of motion and swelling.       Left ankle: He exhibits decreased range of motion and swelling.  I was able to move  around his lower extremity joints with better range of motion today. He still has resistance in his legs and does not allow me to move it fully.  Lymphadenopathy:    He has no cervical adenopathy.  Neurological: He is alert. No cranial nerve deficit.  Patient able to straight leg raise.  Skin: Skin is warm. No rash noted. Nails show no clubbing.  Psychiatric: He has a normal mood and affect.    Data Reviewed: CBC:  Recent Labs Lab 10/02/15 1314  WBC 11.0*  HGB 11.7*  HCT 34.6*  MCV 94.9  PLT 307    CBG:  Recent Labs Lab 10/07/15 1226 10/07/15 1611 10/07/15 2045 10/08/15 0715 10/08/15 1155  GLUCAP 135* 102* 108* 103* 91   Scheduled Meds: . amiodarone  400 mg Oral Daily  . aspirin  81 mg Oral Daily  . cyanocobalamin  1,000 mcg Intramuscular Once  . docusate sodium  100 mg Oral BID  . enoxaparin (LOVENOX) injection  40 mg Subcutaneous Q24H  . gabapentin  300 mg Oral BID  . insulin aspart  0-5 Units Subcutaneous QHS  . insulin aspart  0-9 Units Subcutaneous TID WC  . lithium carbonate  900 mg Oral QHS  . metFORMIN  500 mg Oral BID WC  . metoprolol tartrate  25 mg Oral BID  . OLANZapine  20 mg Oral QHS  . primidone  50 mg Oral QHS  . sodium  chloride  3 mL Intravenous Q12H  . tamsulosin  0.4 mg Oral Daily  . venlafaxine XR  150 mg Oral Q breakfast    Assessment/Plan:  1. Weakness and joint pains all over- I stopped simvastatin on Saturday. This could also be psychosomatic with his mood. With him not being able to walk he cannot go to the psychiatric floor. 2. Severe depression with suicidal ideation- psychiatry doing ECT treatments. Second treatment was on Monday. Continue psychiatric medications as per psychiatry team. 3. Ventricular tachycardia- on amiodarone by cardiology. As per cardiology, stress test as outpatient. 4. Essential hypertension on metoprolol 5. BPH on Flomax 6. Essential tremor on primidone  Code Status:     Code Status Orders         Start     Ordered   09/25/15 1729  Full code   Continuous     09/25/15 1729     Disposition Plan: To be determined  Consultants:  Cardiology  Psychiatry  Procedures:  ECT treatments  Time spent: 20 minutes  Alford Highland  [facility]

## 2015-10-08 NOTE — Care Management (Signed)
Met briefly with patient who remains 1:1 followed by Dr. Weber Cooks. I have encouraged him to ambulate- he again says "in a few days". He seemed to become irritated as I encouraged him to try saying "I can't". Staff said he is able to ambulate to chair. Patient is currently in bed. RNCM will continue to follow.

## 2015-10-08 NOTE — Progress Notes (Signed)
SUBJECTIVE: Patient had a restless night and is sleeping right now   Filed Vitals:   10/07/15 1941 10/07/15 1958 10/08/15 0549 10/08/15 0752  BP: 138/56 116/78 135/58 134/49  Pulse: 58 88 51 50  Temp: 98.5 F (36.9 C) 98.4 F (36.9 C) 98.4 F (36.9 C) 98.4 F (36.9 C)  TempSrc: Oral Oral Oral Oral  Resp: 18 20 18 17   Height:      Weight:      SpO2: 96% 100% 95% 95%    Intake/Output Summary (Last 24 hours) at 10/08/15 0914 Last data filed at 10/08/15 [id number]  Gross per 24 hour  Intake   1630 ml  Output   4715 ml  Net  -3085 ml    LABS: Basic Metabolic Panel: No results for input(s): NA, K, CL, CO2, GLUCOSE, BUN, CREATININE, CALCIUM, MG, PHOS in the last 72 hours. Liver Function Tests: No results for input(s): AST, ALT, ALKPHOS, BILITOT, PROT, ALBUMIN in the last 72 hours. No results for input(s): LIPASE, AMYLASE in the last 72 hours. CBC: No results for input(s): WBC, NEUTROABS, HGB, HCT, MCV, PLT in the last 72 hours. Cardiac Enzymes: No results for input(s): CKTOTAL, CKMB, CKMBINDEX, TROPONINI in the last 72 hours. BNP: Invalid input(s): POCBNP D-Dimer: No results for input(s): DDIMER in the last 72 hours. Hemoglobin A1C: No results for input(s): HGBA1C in the last 72 hours. Fasting Lipid Panel: No results for input(s): CHOL, HDL, LDLCALC, TRIG, CHOLHDL, LDLDIRECT in the last 72 hours. Thyroid Function Tests: No results for input(s): TSH, T4TOTAL, T3FREE, THYROIDAB in the last 72 hours.  Invalid input(s): FREET3 Anemia Panel: No results for input(s): VITAMINB12, FOLATE, FERRITIN, TIBC, IRON, RETICCTPCT in the last 72 hours.   PHYSICAL EXAM General: Well developed, well nourished, in no acute distress HEENT:  Normocephalic and atramatic Neck:  No JVD.  Lungs: Clear bilaterally to auscultation and percussion. Heart: HRRR . Normal S1 and S2 without gallops or murmurs.  Abdomen: Bowel sounds are positive, abdomen soft and non-tender  Msk:  Back normal, normal  gait. Normal strength and tone for age. Extremities: No clubbing, cyanosis or edema.   Neuro: Alert and oriented X 3. Psych:  Good affect, responds appropriately  TELEMETRY: Sinus rhythm  ASSESSMENT AND PLAN: Nonsustained ventricular tachycardia, on 400 by mouth amiodarone. No further episode of nonsustained ventricular tachycardia. [facility].  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

## 2015-10-08 NOTE — Progress Notes (Signed)
Per RN patient has VRE in urine. Clinical Child psychotherapistocial Worker (CSW) contacted [facility] and made him aware of above. Per Thayer Ohmhris they can put patient in isolation room if needed. CSW will continue to follow and assist as needed.   Jetta LoutBailey Morgan, LCSWA [phone]

## 2015-10-09 ENCOUNTER — Inpatient Hospital Stay: Payer: Medicare Other | Admitting: Anesthesiology

## 2015-10-09 ENCOUNTER — Other Ambulatory Visit: Payer: Self-pay

## 2015-10-09 ENCOUNTER — Inpatient Hospital Stay: Payer: Medicare Other

## 2015-10-09 LAB — CBC
HEMATOCRIT: 33.9 % — AB (ref 40.0–52.0)
HEMOGLOBIN: 11.8 g/dL — AB (ref 13.0–18.0)
MCH: 33.2 pg (ref 26.0–34.0)
MCHC: 34.7 g/dL (ref 32.0–36.0)
MCV: 95.6 fL (ref 80.0–100.0)
Platelets: 335 10*3/uL (ref 150–440)
RBC: 3.55 MIL/uL — AB (ref 4.40–5.90)
RDW: 12.7 % (ref 11.5–14.5)
WBC: 15.8 10*3/uL — ABNORMAL HIGH (ref 3.8–10.6)

## 2015-10-09 LAB — BASIC METABOLIC PANEL
ANION GAP: 6 (ref 5–15)
BUN: 10 mg/dL (ref 6–20)
CHLORIDE: 104 mmol/L (ref 101–111)
CO2: 28 mmol/L (ref 22–32)
Calcium: 9.4 mg/dL (ref 8.9–10.3)
Creatinine, Ser: 0.63 mg/dL (ref 0.61–1.24)
GFR calc non Af Amer: >60 mL/min (ref >60)
Glucose, Bld: 107 mg/dL — ABNORMAL HIGH (ref 65–99)
POTASSIUM: 3.9 mmol/L (ref 3.5–5.1)
SODIUM: 138 mmol/L (ref 135–145)

## 2015-10-09 LAB — GLUCOSE, CAPILLARY
GLUCOSE-CAPILLARY: 99 mg/dL (ref 65–99)
Glucose-Capillary: 105 mg/dL — ABNORMAL HIGH (ref 65–99)
Glucose-Capillary: 119 mg/dL — ABNORMAL HIGH (ref 65–99)
Glucose-Capillary: 81 mg/dL (ref 65–99)

## 2015-10-09 LAB — LITHIUM LEVEL: LITHIUM LVL: 0.68 mmol/L (ref 0.60–1.20)

## 2015-10-09 MED ORDER — SUCCINYLCHOLINE CHLORIDE 20 MG/ML IJ SOLN
120.0000 mg | Freq: Once | INTRAMUSCULAR | Status: AC
  Administered 2015-10-09: 120 mg via INTRAVENOUS

## 2015-10-09 MED ORDER — ONDANSETRON HCL 4 MG/2ML IJ SOLN: 4.0000 mg | Freq: Once | INTRAMUSCULAR | Status: DC | PRN

## 2015-10-09 MED ORDER — KETAMINE HCL 10 MG/ML IJ SOLN
100.0000 mg | Freq: Once | INTRAMUSCULAR | Status: AC
  Administered 2015-10-09: 100 mg via INTRAVENOUS

## 2015-10-09 MED ORDER — SODIUM CHLORIDE 0.9 % IV SOLN: 250.0000 mL | Freq: Once | INTRAVENOUS | Status: DC

## 2015-10-09 MED ORDER — FENTANYL CITRATE (PF) 100 MCG/2ML IJ SOLN: 25.0000 ug | INTRAMUSCULAR | Status: DC | PRN

## 2015-10-09 MED ORDER — LIDOCAINE HCL (CARDIAC) 20 MG/ML IV SOLN
4.0000 mg | Freq: Once | INTRAVENOUS | Status: AC
  Administered 2015-10-09: 4 mg via INTRAVENOUS

## 2015-10-09 MED ORDER — LITHIUM CARBONATE ER 300 MG PO TBCR
1200.0000 mg | EXTENDED_RELEASE_TABLET | Freq: Every day | ORAL | Status: DC
  Administered 2015-10-09 – 2015-10-14 (×6): 1200 mg via ORAL
  Filled 2015-10-09 (×7): qty 4

## 2015-10-09 NOTE — Progress Notes (Signed)
PT Cancellation Note  Patient Details Name: Kyle [name] MRN: [id number] DOB: [birth year]   Cancelled Treatment:    Reason Eval/Treat Not Completed: Patient at procedure or test/unavailable  Pt was not in room, will try back at a later date.   Malachi ProGalen R Anadelia Kintz 10/09/2015, 5:12 PM

## 2015-10-09 NOTE — Transfer of Care (Signed)
Immediate Anesthesia Transfer of Care Note  Patient: Kyle [name]  Procedure(s) Performed: * No procedures listed *  Patient Location: PACU  Anesthesia Type:General  Level of Consciousness: sedated  Airway & Oxygen Therapy: Patient Spontanous Breathing and Patient connected to face mask oxygen  Post-op Assessment: Report given to RN and Post -op Vital signs reviewed and stable  Post vital signs: Reviewed and stable  Last Vitals:  Filed Vitals:   10/09/15 1234  BP: 164/73  Pulse: 69  Temp: 36.6 C  Resp: 19    Complications: No apparent anesthesia complications

## 2015-10-09 NOTE — Progress Notes (Signed)
   SUBJECTIVE: pt just returned from ECT. States he feels ok, no CP or SOB.    Filed Vitals:   10/09/15 1234 10/09/15 1245 10/09/15 1255 10/09/15 1303  BP: 164/73 130/63 138/53 115/68  Pulse: 69 62 62 62  Temp: 97.8 F (36.6 C)   99.2 F (37.3 C)  TempSrc: Tympanic   Tympanic  Resp: 19 16 16 16   Height:      Weight:      SpO2: 98% 98% 92% 92%    Intake/Output Summary (Last 24 hours) at 10/09/15 1319 Last data filed at 10/09/15 1306  Gross per 24 hour  Intake   5443 ml  Output   5340 ml  Net    103 ml    LABS: Basic Metabolic Panel:  Recent Labs  04/54/0911/16/16 0648  NA 138  K 3.9  CL 104  CO2 28  GLUCOSE 107*  BUN 10  CREATININE 0.63  CALCIUM 9.4   Liver Function Tests: No results for input(s): AST, ALT, ALKPHOS, BILITOT, PROT, ALBUMIN in the last 72 hours. No results for input(s): LIPASE, AMYLASE in the last 72 hours. CBC:  Recent Labs  10/09/15 0648  WBC 15.8*  HGB 11.8*  HCT 33.9*  MCV 95.6  PLT 335   Cardiac Enzymes: No results for input(s): CKTOTAL, CKMB, CKMBINDEX, TROPONINI in the last 72 hours. BNP: Invalid input(s): POCBNP D-Dimer: No results for input(s): DDIMER in the last 72 hours. Hemoglobin A1C: No results for input(s): HGBA1C in the last 72 hours. Fasting Lipid Panel: No results for input(s): CHOL, HDL, LDLCALC, TRIG, CHOLHDL, LDLDIRECT in the last 72 hours. Thyroid Function Tests: No results for input(s): TSH, T4TOTAL, T3FREE, THYROIDAB in the last 72 hours.  Invalid input(s): FREET3 Anemia Panel: No results for input(s): VITAMINB12, FOLATE, FERRITIN, TIBC, IRON, RETICCTPCT in the last 72 hours.   PHYSICAL EXAM General: Well developed, well nourished, in no acute distress HEENT: Normocephalic and atramatic Neck: No JVD.  Lungs: Clear bilaterally to auscultation and percussion. Heart: HRRR . Normal S1 and S2 without gallops or murmurs.  Abdomen: Bowel sounds are positive, abdomen soft and non-tender  Extremities: No  clubbing, cyanosis or edema.   ASSESSMENT AND PLAN:  1. NS VT: Continue amiodarone PO 400mg  daily. Pt will likely need NST as outpatient to evaluate cause of VT, although it is likely metabolic. Pt given outpatient f/u 11/22 at 2pm.   Patient and plan discussed with supervising provider, Dr. Adrian BlackwaterShaukat Khan, who agrees with above findings.   Alinda SierrasEileen A. Margarito CourserRomano, PA-C [facility]  10/09/2015 1:19 PM

## 2015-10-09 NOTE — Anesthesia Preprocedure Evaluation (Signed)
Anesthesia Evaluation  Patient identified by MRN, date of birth, ID band Patient awake    Reviewed: Allergy & Precautions, NPO status , Patient's Chart, lab work & pertinent test results  Airway Mallampati: III      Comment: beard Dental  (+) Poor Dentition   Pulmonary neg pulmonary ROS,    Pulmonary exam normal breath sounds clear to auscultation       Cardiovascular hypertension, Pt. on medications Normal cardiovascular exam+ dysrhythmias Supra Ventricular Tachycardia      Neuro/Psych PSYCHIATRIC DISORDERS Depression Bipolar Disorder Patient states that he cannot walk...peripheral neuropathy  Neuromuscular disease    GI/Hepatic negative GI ROS, Neg liver ROS,   Endo/Other    Renal/GU negative Renal ROS     Musculoskeletal negative musculoskeletal ROS (+)   Abdominal Normal abdominal exam  (+)   Peds  Hematology negative hematology ROS (+)   Anesthesia Other Findings Nonsustained Vtach after two episodes of SVT.. Patient in NSR now.. Patient on amiodarone  Reproductive/Obstetrics                            Anesthesia Physical Anesthesia Plan  ASA: III  Anesthesia Plan: General   Post-op Pain Management:    Induction: Intravenous  Airway Management Planned: Mask  Additional Equipment:   Intra-op Plan:   Post-operative Plan:   Informed Consent: I have reviewed the patients History and Physical, chart, labs and discussed the procedure including the risks, benefits and alternatives for the proposed anesthesia with the patient or authorized representative who has indicated his/her understanding and acceptance.   Dental advisory given  Plan Discussed with: CRNA and Surgeon  Anesthesia Plan Comments:         Anesthesia Quick Evaluation  

## 2015-10-09 NOTE — Progress Notes (Signed)
Per RN patient does not have VRE in urine and is not on isolation. [facility] [place]. Clinical Social Worker (CSW) will continue to follow and assist as needed.   Jetta LoutBailey Morgan, LCSWA [phone]
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
## 2015-10-09 NOTE — Anesthesia Postprocedure Evaluation (Signed)
  Anesthesia Post-op Note  Patient: Kyle [name]  Procedure(s) Performed: * No procedures listed *  Anesthesia type:General  Patient location: PACU  Post pain: Pain level controlled  Post assessment: Post-op Vital signs reviewed, Patient's Cardiovascular Status Stable, Respiratory Function Stable, Patent Airway and No signs of Nausea or vomiting  Post vital signs: Reviewed and stable  Last Vitals:  Filed Vitals:   10/09/15 1557  BP: 153/54  Pulse: 57  Temp: 36.4 C  Resp: 18    Level of consciousness: awake, alert  and patient cooperative  Complications: No apparent anesthesia complications

## 2015-10-09 NOTE — Progress Notes (Signed)
Patient ID: Suezanne JacquetMarvin D Spiller, male   DOB: November 27, 1945, 69 y.o.   MRN: [id number] [facility] PROGRESS NOTE  PCP: No PCP Per Patient  HPI/Subjective: Patient had his third ECT treatment today. Patient feels okay and answers all questions appropriately. He thinks his legs feel a little bit better. Not complaining of pain there.  Objective: Filed Vitals:   10/09/15 1557  BP: 153/54  Pulse: 57  Temp: 97.6 F (36.4 C)  Resp: 18    Filed Weights   09/25/15 1334 10/04/15 0849 10/07/15 0855  Weight: 99.791 kg (220 lb) 99.791 kg (220 lb) 93.804 kg (206 lb 12.8 oz)    ROS: Review of Systems  Constitutional: Positive for malaise/fatigue. Negative for fever and chills.  Eyes: Negative for blurred vision.  Respiratory: Negative for cough and shortness of breath.   Cardiovascular: Negative for chest pain.  Gastrointestinal: Negative for nausea, vomiting, abdominal pain, diarrhea and constipation.  Genitourinary: Negative for dysuria.  Musculoskeletal: Negative for joint pain.  Neurological: Negative for dizziness and headaches.   Exam: Physical Exam  HENT:  Nose: No mucosal edema.  Mouth/Throat: No oropharyngeal exudate or posterior oropharyngeal edema.  Eyes: Conjunctivae, EOM and lids are normal. Pupils are equal, round, and reactive to light.  Neck: No JVD present. Carotid bruit is not present. No edema present. No thyroid mass and no thyromegaly present.  Cardiovascular: S1 normal and S2 normal.  Exam reveals no gallop.   No murmur heard. Pulses:      Dorsalis pedis pulses are 2+ on the right side, and 2+ on the left side.  Respiratory: No respiratory distress. He has no wheezes. He has no rhonchi. He has no rales.  GI: Soft. Bowel sounds are normal. There is no tenderness.  Musculoskeletal:       Right ankle: He exhibits swelling.       Left ankle: He exhibits swelling.  Better range of motion in his joints on his lower extremity with passive range of motion   Lymphadenopathy:    He has no cervical adenopathy.  Neurological: He is alert. No cranial nerve deficit.  Patient able to straight leg raise.  Skin: Skin is warm. No rash noted. Nails show no clubbing.  Psychiatric: He has a normal mood and affect.    Data Reviewed: CBC:  Recent Labs Lab 10/09/15 0648  WBC 15.8*  HGB 11.8*  HCT 33.9*  MCV 95.6  PLT 335    CBG:  Recent Labs Lab 10/08/15 1621 10/08/15 2050 10/09/15 0730 10/09/15 1325 10/09/15 1618  GLUCAP 139* 101* 99 105* 119*   Scheduled Meds: . sodium chloride  250 mL Intravenous Once  . amiodarone  400 mg Oral Daily  . aspirin  81 mg Oral Daily  . docusate sodium  100 mg Oral BID  . enoxaparin (LOVENOX) injection  40 mg Subcutaneous Q24H  . gabapentin  300 mg Oral BID  . insulin aspart  0-5 Units Subcutaneous QHS  . insulin aspart  0-9 Units Subcutaneous TID WC  . lithium carbonate  900 mg Oral QHS  . metFORMIN  500 mg Oral BID WC  . metoprolol tartrate  25 mg Oral BID  . OLANZapine  20 mg Oral QHS  . primidone  50 mg Oral QHS  . sodium chloride  3 mL Intravenous Q12H  . tamsulosin  0.4 mg Oral Daily  . venlafaxine XR  300 mg Oral Q breakfast    Assessment/Plan:  1. Weakness and joint pains all over- I stopped  simvastatin on Saturday. This could also be psychosomatic with his mood. With him not being able to walk he cannot go to the psychiatric floor. I have not seen the physical therapy note in a couple days I asked the social worker and care manager to have physical therapy see the patient. 2. Severe depression with suicidal ideation- psychiatry doing ECT treatments. Third treatment today of ECT. Continue psychiatric medications as per psychiatry team. 3. Ventricular tachycardia- on amiodarone by cardiology. As per cardiology, stress test as outpatient. 4. Essential hypertension on metoprolol 5. BPH on Flomax 6. Essential tremor on primidone  Code Status:     Code Status Orders        Start      Ordered   09/25/15 1729  Full code   Continuous     09/25/15 1729     Disposition Plan: To be determined  Consultants:  Cardiology  Psychiatry  Procedures:  ECT treatments  Time spent: 20 minutes  Alford Highland  [facility]
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
## 2015-10-09 NOTE — Care Management Important Message (Signed)
Important Message  Patient Details  Name: Kyle [name] MRN: [id number] Date of Birth: 03/09/1946   Medicare Important Message Given:  Yes    [name], RN 10/09/2015, 8:32 AM

## 2015-10-09 NOTE — Procedures (Signed)
ECT SERVICES Physician's Interval Evaluation & Treatment Note  Patient Identification: Kyle [name] MRN:  [id number] Date of Evaluation:  10/09/2015 TX #: 3  MADRS:   MMSE:   P.E. Findings:  No change to physical findings except that he may be getting a little strong  Psychiatric Interval Note:  Continue down and withdrawn on interactive dysphoric  Subjective:  Patient is a 69 y.o. male seen for evaluation for Electroconvulsive Therapy. No specific new complaints  Treatment Summary:   []   Right Unilateral             [x]  Bilateral   % Energy : 1.0 ms 100%   Impedance: 620 ohms  Seizure Energy Index: 14,857 V squared  Postictal Suppression Index: 94%  Seizure Concordance Index: 17%  Medications  Pre Shock: Xylocaine 4 mg ketamine 100 mg succinylcholine 80 mg  Post Shock: None  Seizure Duration: 10 seconds by EMG, 17 seconds by EEG   Comments: Unclear why he is still having such poor quality and length of seizures. Will review medication. May need to abandon ECT if it can't be improved.   Lungs:  [x]   Clear to auscultation               []  Other:   Heart:    [x]   Regular rhythm             []  irregular rhythm    [x]   Previous H&P reviewed, patient examined and there are NO CHANGES                 []   Previous H&P reviewed, patient examined and there are changes noted.   Kyle RasmussenJohn Keven Soucy, MD 11/16/201612:15 PM

## 2015-10-10 LAB — GLUCOSE, CAPILLARY
GLUCOSE-CAPILLARY: 91 mg/dL (ref 65–99)
GLUCOSE-CAPILLARY: 97 mg/dL (ref 65–99)
Glucose-Capillary: 98 mg/dL (ref 65–99)
Glucose-Capillary: 72 mg/dL (ref 65–99)

## 2015-10-10 NOTE — Progress Notes (Signed)
Regular diet per Dr.Clapacs.

## 2015-10-10 NOTE — Progress Notes (Signed)
[facility] [place] at [facility]   PATIENT NAME: Kyle LennoxMarvin [name]    MR#:  [id number]  DATE OF BIRTH:  [birth year]  SUBJECTIVE:  CHIEF COMPLAINT:   Chief Complaint  Patient presents with  . Suicidal  . Chest Pain   -Patient being treated for depression with ECT treatments at this time. On medical service as he is unable to ambulate due to weakness. -Appears more alert and interactive today. Appetite has improved. Sister at bedside. -Also has a Comptrollersitter at bedside  REVIEW OF SYSTEMS:  Review of Systems  Constitutional: Positive for malaise/fatigue. Negative for fever and chills.  HENT: Negative for ear discharge and nosebleeds.   Eyes: Negative for blurred vision and double vision.  Respiratory: Negative for cough, shortness of breath and wheezing.   Cardiovascular: Negative for chest pain, palpitations and leg swelling.  Gastrointestinal: Negative for nausea, vomiting, abdominal pain, diarrhea and constipation.  Genitourinary: Negative for dysuria.  Musculoskeletal: Positive for myalgias and joint pain.  Neurological: Negative for dizziness, speech change, focal weakness, seizures and headaches.  Psychiatric/Behavioral: Positive for depression and suicidal ideas.    DRUG ALLERGIES:  No Known Allergies  VITALS:  Blood pressure 122/48, pulse 54, temperature 98.7 F (37.1 C), temperature source Oral, resp. rate 18, height 5\' 8"  (1.727 m), weight 93.804 kg (206 lb 12.8 oz), SpO2 97 %.  PHYSICAL EXAMINATION:  Physical Exam  GENERAL:  69 y.o.-year-old patient sitting in the bed with no acute distress.  EYES: Pupils equal, round, reactive to light and accommodation. No scleral icterus. Extraocular muscles intact.  HEENT: Head atraumatic, normocephalic. Oropharynx and nasopharynx clear.  NECK:  Supple, no jugular venous distention. No thyroid enlargement, no tenderness.  LUNGS: Normal breath sounds bilaterally, no wheezing, rales,rhonchi or crepitation. No  use of accessory muscles of respiration.  CARDIOVASCULAR: S1, S2 normal. No murmurs, rubs, or gallops.  ABDOMEN: Soft, nontender, nondistended. Bowel sounds present. No organomegaly or mass.  EXTREMITIES: No pedal edema, cyanosis, or clubbing.  NEUROLOGIC: Cranial nerves II through XII are intact. Muscle strength 5/5 in all extremities, his lower extremity strength is good when he is in the bed.. Sensation intact. Gait not checked.  Bilateral upper extremity tremors noted at rest. PSYCHIATRIC: The patient is alert and oriented x 3.  SKIN: No obvious rash, lesion, or ulcer.    LABORATORY PANEL:   CBC  Recent Labs Lab 10/09/15 0648  WBC 15.8*  HGB 11.8*  HCT 33.9*  PLT 335   ------------------------------------------------------------------------------------------------------------------  Chemistries   Recent Labs Lab 10/09/15 0648  NA 138  K 3.9  CL 104  CO2 28  GLUCOSE 107*  BUN 10  CREATININE 0.63  CALCIUM 9.4   ------------------------------------------------------------------------------------------------------------------  Cardiac Enzymes No results for input(s): TROPONINI in the last 168 hours. ------------------------------------------------------------------------------------------------------------------  RADIOLOGY:  No results found.  EKG:   Orders placed or performed during the hospital encounter of 09/25/15  . EKG 12-Lead  . EKG 12-Lead    ASSESSMENT AND PLAN:   69 year old male with past medical history significant for major depression with prior behavioral medicine admissions and also admission [facility], hypertension and BPH admitted to the hospital secondary to weakness and noted to be depressed.  #1 generalized weakness-statin has been stopped. Likely reason being his depression. -Continue physical therapy. They have recommended rehabilitation at this time. -Encourage ambulation. -MRI of the brain done with and without  contrast does not show any acute findings  #2 nonsustained ventricular tachycardia-appreciate cardiology consult. No further arrhythmias  noted. -Recent echocardiogram with normal ejection fraction EF of 60%, diastolic dysfunction noted. No valvular abnormalities noted. -Patient further cardiac workup for ventricular tachycardia device. -Patient is on amiodarone orally now -Also on oral metoprolol.  #3 major depression with suicidal ideation-multiple admissions to behavioral medicine unit recently. -Triggered by wife's recent death. -Continue sitter at bedside. Appreciate psych consult. -Continue ECT treatments as clinically showing some improvement. -Continue Effexor, Zyprexa, lithium.  #4 benign essential tremors. On primidone  Continue physical therapy   All the records are reviewed and case discussed with Care Management/Social Workerr. Management plans discussed with the patient, family and they are in agreement.  CODE STATUS: Full code  TOTAL TIME TAKING CARE OF THIS PATIENT: 30 minutes.   POSSIBLE D/C IN 5 DAYS, DEPENDING ON CLINICAL CONDITION.   Enid Baas M.D on 10/10/2015 at 3:40 PM  Between 7am to 6pm - Pager - [phone]  After 6pm go to [email or web address] - password [email or web address] [facility]  [place] [facility]  Office  [phone]  CC: Primary care physician; No PCP Per Patient

## 2015-10-10 NOTE — Progress Notes (Signed)
   SUBJECTIVE: Pt states he is feeling "so-so" today. No CP or palpitations.    Filed Vitals:   10/09/15 1557 10/09/15 2152 10/10/15 0608 10/10/15 0731  BP: 153/54 138/64 138/56 148/65  Pulse: 57 56 57 60  Temp: 97.6 F (36.4 C) 98.8 F (37.1 C)  98.6 F (37 C)  TempSrc: Oral Oral  Oral  Resp: 18   18  Height:      Weight:      SpO2: 96% 90% 96% 96%    Intake/Output Summary (Last 24 hours) at 10/10/15 0920 Last data filed at 10/09/15 2202  Gross per 24 hour  Intake   1763 ml  Output   2325 ml  Net   -562 ml    LABS: Basic Metabolic Panel:  Recent Labs  21/30/8611/16/16 0648  NA 138  K 3.9  CL 104  CO2 28  GLUCOSE 107*  BUN 10  CREATININE 0.63  CALCIUM 9.4   Liver Function Tests: No results for input(s): AST, ALT, ALKPHOS, BILITOT, PROT, ALBUMIN in the last 72 hours. No results for input(s): LIPASE, AMYLASE in the last 72 hours. CBC:  Recent Labs  10/09/15 0648  WBC 15.8*  HGB 11.8*  HCT 33.9*  MCV 95.6  PLT 335   Cardiac Enzymes: No results for input(s): CKTOTAL, CKMB, CKMBINDEX, TROPONINI in the last 72 hours. BNP: Invalid input(s): POCBNP D-Dimer: No results for input(s): DDIMER in the last 72 hours. Hemoglobin A1C: No results for input(s): HGBA1C in the last 72 hours. Fasting Lipid Panel: No results for input(s): CHOL, HDL, LDLCALC, TRIG, CHOLHDL, LDLDIRECT in the last 72 hours. Thyroid Function Tests: No results for input(s): TSH, T4TOTAL, T3FREE, THYROIDAB in the last 72 hours.  Invalid input(s): FREET3 Anemia Panel: No results for input(s): VITAMINB12, FOLATE, FERRITIN, TIBC, IRON, RETICCTPCT in the last 72 hours.   PHYSICAL EXAM General: Well developed, well nourished, in no acute distress HEENT: Normocephalic and atramatic Neck: No JVD.  Lungs: Clear bilaterally to auscultation and percussion. Heart: HRRR . Normal S1 and S2 without gallops or murmurs.  Abdomen: Bowel sounds are positive, abdomen soft and non-tender  Extremities: No  clubbing, cyanosis or edema.   ASSESSMENT AND PLAN:  1. NS VT: Continue amiodarone PO 400mg  daily. Pt will likely need NST as outpatient to evaluate cause of VT, although it is likely metabolic. Pt given outpatient f/u 11/22 at 2pm.    Patient and plan discussed with supervising provider, Dr. Adrian BlackwaterShaukat Khan, who agrees with above findings.   Alinda SierrasEileen A. Margarito CourserRomano, PA-C [facility]  10/10/2015 9:20 AM

## 2015-10-10 NOTE — Progress Notes (Signed)
Nutrition Follow-up   INTERVENTION:   Meals and Snacks: Cater to patient preferences; sitter reports pt requiring assistance at meals secondary to hand tremors Medical Food Supplement Therapy: RD notes Ensure has been d/c. Can recommend an alternate supplement if intake poor. Coordination of Care: RD will signoff as pt eating >75% of meals as recorded meeting nutritional needs at this time. Please reconsult RD if change in nutritional status.    NUTRITION DIAGNOSIS:   Inadequate oral intake related to acute illness as evidenced by per patient/family report, meal completion < 50%, improving  GOAL:   Patient will meet greater than or equal to 90% of their needs;ongoing  MONITOR:    (Energy intake)  REASON FOR ASSESSMENT:   LOS    ASSESSMENT:    Pt continues on ECT treatments. Pt with sitter at bedside.   Diet Order:  Diet regular Room service appropriate?: Yes; Fluid consistency:: Thin    Current Nutrition: Pt eating well per sitter today. Pt reports eating 75-100% of meals with a good appetite. Recorded po intake >60% of meals including some outside foods. Pt does continue to be NPO after midnight on days of ECT treatments. Sitter reports pt drinking fluids/water very well.   Gastrointestinal Profile: Last BM: 10/07/2015   Scheduled Medications:  . sodium chloride  250 mL Intravenous Once  . amiodarone  400 mg Oral Daily  . aspirin  81 mg Oral Daily  . docusate sodium  100 mg Oral BID  . enoxaparin (LOVENOX) injection  40 mg Subcutaneous Q24H  . gabapentin  300 mg Oral BID  . insulin aspart  0-5 Units Subcutaneous QHS  . insulin aspart  0-9 Units Subcutaneous TID WC  . lithium carbonate  1,200 mg Oral QHS  . metFORMIN  500 mg Oral BID WC  . metoprolol tartrate  25 mg Oral BID  . OLANZapine  20 mg Oral QHS  . primidone  50 mg Oral QHS  . sodium chloride  3 mL Intravenous Q12H  . tamsulosin  0.4 mg Oral Daily  . venlafaxine XR  300 mg Oral Q breakfast     Electrolyte/Renal Profile and Glucose Profile:   Recent Labs Lab 10/09/15 0648  NA 138  K 3.9  CL 104  CO2 28  BUN 10  CREATININE 0.63  CALCIUM 9.4  GLUCOSE 107*   Protein Profile: No results for input(s): ALBUMIN in the last 168 hours.    Weight Trend since Admission: Filed Weights   09/25/15 1334 10/04/15 0849 10/07/15 0855  Weight: 220 lb (99.791 kg) 220 lb (99.791 kg) 206 lb 12.8 oz (93.804 kg)     BMI:  Body mass index is 31.45 kg/(m^2).   EDUCATION NEEDS:   No education needs identified at this time    Leda QuailAllyson Kaelei Wheeler, RD, LDN Pager [phone]

## 2015-10-10 NOTE — Progress Notes (Signed)
Clinical Education officer, museum (CSW) met with patient to provide support. Patient gave CSW permission to call his sister Joycelyn Schmid. CSW contacted patient's sister and gave her an update. Sister is in agreement with patient going to [facility] in [place] or going to [facility]. Sister reported that patient can walk,"he just doesn't want to". CSW will continue to follow and assist as needed.   Blima Rich, [place] [phone]

## 2015-10-10 NOTE — Progress Notes (Signed)
Physical Therapy Treatment Patient Details Name: Kyle [name] MRN: [id number] DOB: [birth year] Today's Date: 10/10/2015    History of Present Illness Patient admitted with a depressive episode secondary to Bipolar disorder. He reports feeling his legs feeling weak and heavy "for three weeks". He has been seen by PT department several times in the last month and ambulated >200 feet each occassion.     PT Comments    Pt with noted tremors in UE and LE, pt able to perform supine to sit trf with minA for sequencing.  Pt performed sit to stand trf  And took several small steps to [facility].  He had no c/o dizziness but fatigued.  Pt able to perform seated LE therex then requrested return to supine.  He performed supine therex as noted with good tolerance.  He would con't to benefit from skilled PT to increase LE strength and improve functional activity tolerance.    Follow Up Recommendations  SNF     Equipment Recommendations  Rolling walker with 5" wheels    Recommendations for Other Services       Precautions / Restrictions Restrictions Weight Bearing Restrictions: No    Mobility  Bed Mobility Overal bed mobility: Needs Assistance Bed Mobility: Supine to Sit;Sit to Supine     Supine to sit: Min assist Sit to supine: Min assist   General bed mobility comments: VC's for sequencing  Transfers Overall transfer level: Needs assistance Equipment used: Rolling walker (2 wheeled) Transfers: Sit to/from Stand Sit to Stand: Min assist         General transfer comment: Pt with cues for hand placement and safety with RW  Ambulation/Gait Ambulation/Gait assistance: Min assist Ambulation Distance (Feet): 3 Feet Assistive device: Rolling walker (2 wheeled)       General Gait Details: Transfer to [facility] with RW   Stairs            Wheelchair Mobility    Modified Rankin (Stroke Patients Only)       Balance Overall balance assessment: Needs assistance Sitting-balance  support: Feet supported Sitting balance-Leahy Scale: Fair     Standing balance support: Bilateral upper extremity supported Standing balance-Leahy Scale: Poor Standing balance comment: Pt with UE and LE tremors increased upon standing                    Cognition Arousal/Alertness: Awake/alert Behavior During Therapy: Agitated Overall Cognitive Status: No family/caregiver present to determine baseline cognitive functioning                      Exercises Other Exercises Other Exercises: Pt performed seated therex, LAQ, hip flexion, hip abd.  Supine therex, SLR, heel slides, QS    General Comments        Pertinent Vitals/Pain Pain Assessment: No/denies pain    Home Living                      Prior Function            PT Goals (current goals can now be found in the care plan section) Acute Rehab PT Goals PT Goal Formulation: Patient unable to participate in goal setting Time For Goal Achievement: 10/11/15 Potential to Achieve Goals: Fair Progress towards PT goals: Progressing toward goals    Frequency  Min 2X/week    PT Plan Current plan remains appropriate    Co-evaluation             End of  Session Equipment Utilized During Treatment: Gait belt Activity Tolerance: Patient tolerated treatment well Patient left: in bed;with nursing/sitter in room     Time: 1130-1155 PT Time Calculation (min) (ACUTE ONLY): 25 min  Charges:  $Therapeutic Exercise: 8-22 mins $Therapeutic Activity: 8-22 mins                    G Codes:      Salem Mastrogiovanni 10/10/2015, 1:19 PM Daffney Greenly, PTA

## 2015-10-11 ENCOUNTER — Inpatient Hospital Stay: Payer: Medicare Other

## 2015-10-11 ENCOUNTER — Other Ambulatory Visit: Payer: Self-pay

## 2015-10-11 ENCOUNTER — Encounter: Payer: Self-pay | Admitting: *Deleted

## 2015-10-11 ENCOUNTER — Inpatient Hospital Stay: Payer: Medicare Other | Admitting: Certified Registered"

## 2015-10-11 LAB — BASIC METABOLIC PANEL
ANION GAP: 7 (ref 5–15)
BUN: 9 mg/dL (ref 6–20)
CHLORIDE: 106 mmol/L (ref 101–111)
CO2: 26 mmol/L (ref 22–32)
Calcium: 9.2 mg/dL (ref 8.9–10.3)
Creatinine, Ser: 0.6 mg/dL — ABNORMAL LOW (ref 0.61–1.24)
Glucose, Bld: 99 mg/dL (ref 65–99)
POTASSIUM: 3.7 mmol/L (ref 3.5–5.1)
SODIUM: 139 mmol/L (ref 135–145)

## 2015-10-11 LAB — CBC
HEMATOCRIT: 34 % — AB (ref 40.0–52.0)
HEMOGLOBIN: 11.3 g/dL — AB (ref 13.0–18.0)
MCH: 31.7 pg (ref 26.0–34.0)
MCHC: 33.2 g/dL (ref 32.0–36.0)
MCV: 95.5 fL (ref 80.0–100.0)
Platelets: 368 10*3/uL (ref 150–440)
RBC: 3.56 MIL/uL — AB (ref 4.40–5.90)
RDW: 13 % (ref 11.5–14.5)
WBC: 12.2 10*3/uL — AB (ref 3.8–10.6)

## 2015-10-11 LAB — GLUCOSE, CAPILLARY
GLUCOSE-CAPILLARY: 91 mg/dL (ref 65–99)
GLUCOSE-CAPILLARY: 114 mg/dL — AB (ref 65–99)
Glucose-Capillary: 93 mg/dL (ref 65–99)

## 2015-10-11 MED ORDER — SODIUM CHLORIDE 0.9 % IV SOLN
INTRAVENOUS | Status: DC | PRN
Start: 2015-10-11 — End: 2015-10-11
  Administered 2015-10-11: 12:00:00 via INTRAVENOUS

## 2015-10-11 MED ORDER — SODIUM CHLORIDE 0.9 % IV SOLN
250.0000 mL | Freq: Once | INTRAVENOUS | Status: AC
  Administered 2015-10-11: 250 mL via INTRAVENOUS

## 2015-10-11 MED ORDER — LIDOCAINE HCL (CARDIAC) 20 MG/ML IV SOLN
4.0000 mg | Freq: Once | INTRAVENOUS | Status: AC
  Administered 2015-10-11: 4 mg via INTRAVENOUS

## 2015-10-11 MED ORDER — SUCCINYLCHOLINE CHLORIDE 20 MG/ML IJ SOLN
120.0000 mg | Freq: Once | INTRAMUSCULAR | Status: AC
  Administered 2015-10-11: 120 mg via INTRAVENOUS

## 2015-10-11 MED ORDER — KETAMINE HCL 10 MG/ML IJ SOLN
100.0000 mg | Freq: Once | INTRAMUSCULAR | Status: AC
  Administered 2015-10-11: 100 mg via INTRAVENOUS
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
## 2015-10-11 NOTE — Plan of Care (Signed)
Problem: Spiritual Needs Goal: Ability to function at adequate level Outcome: Progressing Pt more responsive today  Problem: Education: Goal: Knowledge of [facility] Education information/materials will improve Outcome: Completed/Met Date Met:  10/11/15 Pt alert and oriented. Verbalizes understanding of unit procedure  Problem: Activity: Goal: Risk for activity intolerance will decrease Outcome: Progressing Up with assist. No s/s of injury  Problem: Nutrition: Goal: Adequate nutrition will be maintained Outcome: Progressing Tolerating po today. At 50% of meals

## 2015-10-11 NOTE — Anesthesia Procedure Notes (Signed)
Performed by: Mathews ArgyleLOGAN, Aijah Lattner Pre-anesthesia Checklist: Patient identified, Timeout performed, Emergency Drugs available, Suction available and Patient being monitored Patient Re-evaluated:Patient Re-evaluated prior to inductionOxygen Delivery Method: Circle system utilized Preoxygenation: Pre-oxygenation with 100% oxygen Intubation Type: IV induction Ventilation: Two handed mask ventilation required, Mask ventilation with difficulty, Oral airway inserted - appropriate to patient size and Nasal airway inserted- appropriate to patient size Difficulty Due To: Difficulty was anticipated

## 2015-10-11 NOTE — Progress Notes (Signed)
   SUBJECTIVE: No CP or palpitations   Filed Vitals:   10/10/15 1536 10/10/15 2031 10/11/15 0356 10/11/15 0754  BP: 122/48 134/62 136/55 146/62  Pulse: 54 53 58 59  Temp: 98.7 F (37.1 C) 97.7 F (36.5 C) 98.5 F (36.9 C) 98.1 F (36.7 C)  TempSrc: Oral Oral Oral Oral  Resp:  20 18 18   Height:      Weight:      SpO2: 97% 98% 95% 96%    Intake/Output Summary (Last 24 hours) at 10/11/15 0844 Last data filed at 10/11/15 0352  Gross per 24 hour  Intake   1800 ml  Output   8452 ml  Net  -6652 ml    LABS: Basic Metabolic Panel:  Recent Labs  16/08/9610/16/16 0648 10/11/15 0512  NA 138 139  K 3.9 3.7  CL 104 106  CO2 28 26  GLUCOSE 107* 99  BUN 10 9  CREATININE 0.63 0.60*  CALCIUM 9.4 9.2   Liver Function Tests: No results for input(s): AST, ALT, ALKPHOS, BILITOT, PROT, ALBUMIN in the last 72 hours. No results for input(s): LIPASE, AMYLASE in the last 72 hours. CBC:  Recent Labs  10/09/15 0648 10/11/15 0512  WBC 15.8* 12.2*  HGB 11.8* 11.3*  HCT 33.9* 34.0*  MCV 95.6 95.5  PLT 335 368   Cardiac Enzymes: No results for input(s): CKTOTAL, CKMB, CKMBINDEX, TROPONINI in the last 72 hours. BNP: Invalid input(s): POCBNP D-Dimer: No results for input(s): DDIMER in the last 72 hours. Hemoglobin A1C: No results for input(s): HGBA1C in the last 72 hours. Fasting Lipid Panel: No results for input(s): CHOL, HDL, LDLCALC, TRIG, CHOLHDL, LDLDIRECT in the last 72 hours. Thyroid Function Tests: No results for input(s): TSH, T4TOTAL, T3FREE, THYROIDAB in the last 72 hours.  Invalid input(s): FREET3 Anemia Panel: No results for input(s): VITAMINB12, FOLATE, FERRITIN, TIBC, IRON, RETICCTPCT in the last 72 hours.   PHYSICAL EXAM General: Well developed, well nourished, in no acute distress HEENT: Normocephalic and atramatic Neck: No JVD.  Lungs: Clear bilaterally to auscultation and percussion. Heart: HRRR . Normal S1 and S2 without gallops or murmurs.  Abdomen:  Bowel sounds are positive, abdomen soft and non-tender  Extremities: No clubbing, cyanosis or edema.   ASSESSMENT AND PLAN:  1. NS VT: Continue amiodarone PO 400mg  daily. Pt will likely need NST as outpatient to evaluate cause of VT, although it is likely metabolic. Pt given outpatient f/u 11/22 at 2pm.    Patient and plan discussed with supervising provider, Dr. Adrian BlackwaterShaukat Khan, who agrees with above findings.   Alinda SierrasEileen A. Margarito CourserRomano, PA-C [facility]  10/11/2015 8:44 AM

## 2015-10-11 NOTE — Anesthesia Preprocedure Evaluation (Signed)
Anesthesia Evaluation  Patient identified by MRN, date of birth, ID band Patient awake    Reviewed: Allergy & Precautions, NPO status , Patient's Chart, lab work & pertinent test results  Airway Mallampati: III      Comment: beard Dental  (+) Poor Dentition   Pulmonary neg pulmonary ROS,    Pulmonary exam normal breath sounds clear to auscultation       Cardiovascular hypertension, Pt. on medications Normal cardiovascular exam+ dysrhythmias Supra Ventricular Tachycardia      Neuro/Psych PSYCHIATRIC DISORDERS Depression Bipolar Disorder Patient states that he cannot walk.Marland Kitchen.Marland Kitchen.peripheral neuropathy...cerebrovascular disease  Neuromuscular disease    GI/Hepatic negative GI ROS, Neg liver ROS,   Endo/Other  negative endocrine ROS  Renal/GU negative Renal ROS     Musculoskeletal negative musculoskeletal ROS (+)   Abdominal Normal abdominal exam  (+)   Peds  Hematology negative hematology ROS (+)   Anesthesia Other Findings Nonsustained Vtach after two episodes of SVT.Marland Kitchen. Patient in NSR now.. Patient on amiodarone  Reproductive/Obstetrics                             Anesthesia Physical Anesthesia Plan  ASA: III  Anesthesia Plan: General   Post-op Pain Management:    Induction: Intravenous  Airway Management Planned: Mask  Additional Equipment:   Intra-op Plan:   Post-operative Plan:   Informed Consent:   Dental advisory given  Plan Discussed with: CRNA and Surgeon  Anesthesia Plan Comments:         Anesthesia Quick Evaluation                                  Anesthesia Evaluation  Patient identified by MRN, date of birth, ID band Patient awake    Reviewed: Allergy & Precautions, NPO status , Patient's Chart, lab work & pertinent test results  Airway Mallampati: III      Comment: beard Dental  (+) Poor Dentition   Pulmonary neg pulmonary ROS,    Pulmonary  exam normal breath sounds clear to auscultation       Cardiovascular hypertension, Pt. on medications Normal cardiovascular exam+ dysrhythmias Supra Ventricular Tachycardia      Neuro/Psych PSYCHIATRIC DISORDERS Depression Bipolar Disorder Patient states that he cannot walk.Marland Kitchen.Marland Kitchen.peripheral neuropathy  Neuromuscular disease    GI/Hepatic negative GI ROS, Neg liver ROS,   Endo/Other    Renal/GU negative Renal ROS     Musculoskeletal negative musculoskeletal ROS (+)   Abdominal Normal abdominal exam  (+)   Peds  Hematology negative hematology ROS (+)   Anesthesia Other Findings Nonsustained Vtach after two episodes of SVT.Marland Kitchen. Patient in NSR now.. Patient on amiodarone  Reproductive/Obstetrics                             Anesthesia Physical Anesthesia Plan  ASA: III  Anesthesia Plan: General   Post-op Pain Management:    Induction: Intravenous  Airway Management Planned: Mask  Additional Equipment:   Intra-op Plan:   Post-operative Plan:   Informed Consent: I have reviewed the patients History and Physical, chart, labs and discussed the procedure including the risks, benefits and alternatives for the proposed anesthesia with the patient or authorized representative who has indicated his/her understanding and acceptance.   Dental advisory given  Plan Discussed with: CRNA and Surgeon  Anesthesia Plan Comments:  Anesthesia Quick Evaluation

## 2015-10-11 NOTE — Progress Notes (Signed)
[facility] [place] [facility] if medically stable. Patient has to be 24 hours without a sitter before going to SNF. Clinical Social Worker (CSW) will continue to follow and assist as needed.   Jetta LoutBailey Morgan, LCSWA [phone]

## 2015-10-11 NOTE — Progress Notes (Signed)
[facility] [place] [facility]   PATIENT NAME: Kyle LennoxMarvin [name]    MR#:  [id number]  DATE OF BIRTH:  05-24-1946  SUBJECTIVE:   -Patient being treated for depression with ECT treatments at this time. On medical service as he is unable to ambulate due to weakness.  Improving and was able to walk with on person assist today.  No other complaints.   REVIEW OF SYSTEMS:  Review of Systems  Constitutional: Negative for fever and chills.  HENT: Negative for ear discharge and nosebleeds.   Eyes: Negative for blurred vision and double vision.  Respiratory: Negative for cough, shortness of breath and wheezing.   Cardiovascular: Negative for chest pain, palpitations and leg swelling.  Gastrointestinal: Negative for nausea, vomiting, abdominal pain, diarrhea and constipation.  Genitourinary: Negative for dysuria.  Neurological: Positive for weakness (Generalized). Negative for dizziness, speech change, focal weakness, seizures and headaches.  Psychiatric/Behavioral: Positive for depression. Negative for suicidal ideas.    DRUG ALLERGIES:  No Known Allergies  VITALS:  Blood pressure 123/65, pulse 61, temperature 98.7 F (37.1 C), temperature source Oral, resp. rate 13, height 5\' 8"  (1.727 m), weight 93.441 kg (206 lb), SpO2 93 %.  PHYSICAL EXAMINATION:  Physical Exam  GENERAL:  69 y.o.-year-old patient sitting up in chair in no acute distress.  EYES: Pupils equal, round, reactive to light and accommodation. No scleral icterus. Extraocular muscles intact.  HEENT: Head atraumatic, normocephalic. Oropharynx and nasopharynx clear.  NECK:  Supple, no jugular venous distention. No thyroid enlargement, no tenderness.  LUNGS: Normal breath sounds bilaterally, no wheezing, rales,rhonchi or crepitation. No use of accessory muscles of respiration.  CARDIOVASCULAR: S1, S2 normal. No murmurs, rubs, or gallops.  ABDOMEN: Soft, nontender, nondistended. Bowel sounds present.  No organomegaly or mass.  EXTREMITIES: No pedal edema, cyanosis, or clubbing.  NEUROLOGIC: Cranial nerves II through XII are intact. No focal motor or sensory deficits appreciated bilaterally.  Able To ambulate with 1 person assist  Bilateral upper extremity tremors noted at rest. PSYCHIATRIC: The patient is alert and oriented x 3. Flat affect.  SKIN: No obvious rash, lesion, or ulcer.    LABORATORY PANEL:   CBC  Recent Labs Lab 10/11/15 0512  WBC 12.2*  HGB 11.3*  HCT 34.0*  PLT 368   ------------------------------------------------------------------------------------------------------------------  Chemistries   Recent Labs Lab 10/11/15 0512  NA 139  K 3.7  CL 106  CO2 26  GLUCOSE 99  BUN 9  CREATININE 0.60*  CALCIUM 9.2   ------------------------------------------------------------------------------------------------------------------  Cardiac Enzymes No results for input(s): TROPONINI in the last 168 hours. ------------------------------------------------------------------------------------------------------------------  RADIOLOGY:  No results found.   ASSESSMENT AND PLAN:   69 year old male with past medical history significant for major depression with prior behavioral medicine admissions and [facility], hypertension and BPH admitted to the hospital secondary to weakness and noted to be depressed.  #1 generalized weakness-Likely reason being his depression. -Patient was able to ambulate with the help of one person assist today. She does not need skilled nursing/services as this is not a rehabilitation need an likely psychosomatic in nature. -Continue to Encourage ambulation. -MRI of the brain done with and without contrast does not show any acute findings  #2 nonsustained ventricular tachycardia-appreciate cardiology consult. No further arrhythmias noted. -Recent echocardiogram with normal ejection fraction EF of 60%,  diastolic dysfunction noted. No valvular abnormalities noted. -cont. Amiodarone, Metoprolol.   #3 major depression with suicidal ideation-multiple admissions to behavioral medicine in the past.  -  Triggered by wife's recent death. -Continue sitter at bedside. Appreciate psych consult. -Continue ECT treatments as per psych -Continue Effexor, Zyprexa, lithium.  #4 benign essential tremors - cont. Primidone  #5 BPH - cont. Flomax.   #6 DM - cont. Metformin.    Discussed with Dr. Toni Amend over the phone and notified him that the patient was able to ambulate with 1 person assist and likely is stable from medical standpoint to be discharged to behavioral medicine.  He will come evaluate patient for possible transfer.    All the records are reviewed and case discussed with Care Management/Social Workerr. Management plans discussed with the patient, family and they are in agreement.  CODE STATUS: Full code  TOTAL TIME TAKING CARE OF THIS PATIENT: 35 minutes.   POSSIBLE D/C IN 1-2 DAYS to behavioral medicine,  DEPENDING ON CLINICAL CONDITION.  Greater than 50% time spent in coordination of care and discussion with Psychiatry.    Houston Siren M.D on 10/11/2015 at 3:02 PM  Between 7am to 6pm - Pager - [phone]  After 6pm go to [email or web address] - password [email or web address] [facility]  [facility]  Office  [phone]  CC: Primary care physician; No PCP Per Patient

## 2015-10-11 NOTE — Progress Notes (Signed)
Patient walked out to nurses station with nurse aide. Plan is for patient to transfer to BMU. Clinical Social Worker (CSW) will continue to follow and assist as needed.   Jetta LoutBailey Morgan, LCSWA [phone]

## 2015-10-11 NOTE — Transfer of Care (Signed)
Immediate Anesthesia Transfer of Care Note  Patient: Kyle [name]  Procedure(s) Performed: ect  Patient Location: PACU  Anesthesia Type:General  Level of Consciousness: awake  Airway & Oxygen Therapy: Patient Spontanous Breathing and Patient connected to face mask oxygen  Post-op Assessment: Report given to RN  Post vital signs: Reviewed  Last Vitals:  Filed Vitals:   10/11/15 1207  BP: 139/79  Pulse: 60  Temp: 37.1 C  Resp: 16    Complications: No apparent anesthesia complications

## 2015-10-11 NOTE — Procedures (Signed)
ECT SERVICES Physician's Interval Evaluation & Treatment Note  Patient Identification: Suezanne JacquetMarvin D Glidden MRN:  [id number] Date of Evaluation:  10/11/2015 TX #: 4  MADRS: 21  MMSE: 28  P.E. Findings:  Still not walking but gradually getting more strength back  Psychiatric Interval Note:  Mood is stated as being okay affect still flat  Subjective:  Patient is a 69 y.o. male seen for evaluation for Electroconvulsive Therapy. No specific new complaints  Treatment Summary:   []   Right Unilateral             [x]  Bilateral   % Energy : 1.0 ms 100%   Impedance: 560 ohms  Seizure Energy Index: 7599 V squared  Postictal Suppression Index: 92%  Seizure Concordance Index: 87%  Medications  Pre Shock: Xylocaine 4 mg ketamine 100 mg succinylcholine 120 mg  Post Shock: None  Seizure Duration: 12 seconds by EMG 17 seconds by EEG   Comments: Inadequate length again despite hyperventilating. We will try discontinuing his primidone. If she can't have a more adequate seizure I will probably recommend that he be discharged to rehabilitation instead.   Lungs:  [x]   Clear to auscultation               []  Other:   Heart:    [x]   Regular rhythm             []  irregular rhythm    [x]   Previous H&P reviewed, patient examined and there are NO CHANGES                 []   Previous H&P reviewed, patient examined and there are changes noted.   Mordecai RasmussenJohn Clapacs, MD 11/18/201611:47 AM
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
## 2015-10-12 LAB — GLUCOSE, CAPILLARY
GLUCOSE-CAPILLARY: 72 mg/dL (ref 65–99)
GLUCOSE-CAPILLARY: 86 mg/dL (ref 65–99)
Glucose-Capillary: 102 mg/dL — ABNORMAL HIGH (ref 65–99)
Glucose-Capillary: 75 mg/dL (ref 65–99)

## 2015-10-12 NOTE — Progress Notes (Signed)
SUBJECTIVE: Feeling much better no new complaints   Filed Vitals:   10/11/15 1226 10/11/15 2041 10/12/15 0414 10/12/15 0729  BP: 123/65 146/78 141/80 133/59  Pulse: 61 58 60 54  Temp:  98.2 F (36.8 C) 98.3 F (36.8 C) 97.5 F (36.4 C)  TempSrc:  Oral  Oral  Resp: 13 19 20 18   Height:      Weight:      SpO2: 93% 97% 99% 99%    Intake/Output Summary (Last 24 hours) at 10/12/15 0957 Last data filed at 10/12/15 0915  Gross per 24 hour  Intake   3140 ml  Output    450 ml  Net   2690 ml    LABS: Basic Metabolic Panel:  Recent Labs  16/08/9610/18/16 0512  NA 139  K 3.7  CL 106  CO2 26  GLUCOSE 99  BUN 9  CREATININE 0.60*  CALCIUM 9.2   Liver Function Tests: No results for input(s): AST, ALT, ALKPHOS, BILITOT, PROT, ALBUMIN in the last 72 hours. No results for input(s): LIPASE, AMYLASE in the last 72 hours. CBC:  Recent Labs  10/11/15 0512  WBC 12.2*  HGB 11.3*  HCT 34.0*  MCV 95.5  PLT 368   Cardiac Enzymes: No results for input(s): CKTOTAL, CKMB, CKMBINDEX, TROPONINI in the last 72 hours. BNP: Invalid input(s): POCBNP D-Dimer: No results for input(s): DDIMER in the last 72 hours. Hemoglobin A1C: No results for input(s): HGBA1C in the last 72 hours. Fasting Lipid Panel: No results for input(s): CHOL, HDL, LDLCALC, TRIG, CHOLHDL, LDLDIRECT in the last 72 hours. Thyroid Function Tests: No results for input(s): TSH, T4TOTAL, T3FREE, THYROIDAB in the last 72 hours.  Invalid input(s): FREET3 Anemia Panel: No results for input(s): VITAMINB12, FOLATE, FERRITIN, TIBC, IRON, RETICCTPCT in the last 72 hours.   PHYSICAL EXAM General: Well developed, well nourished, in no acute distress HEENT:  Normocephalic and atramatic Neck:  No JVD.  Lungs: Clear bilaterally to auscultation and percussion. Heart: HRRR . Normal S1 and S2 without gallops or murmurs.  Abdomen: Bowel sounds are positive, abdomen soft and non-tender  Msk:  Back normal, normal gait. Normal strength  and tone for age. Extremities: No clubbing, cyanosis or edema.   Neuro: Alert and oriented X 3. 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

## 2015-10-12 NOTE — Progress Notes (Signed)
[facility]  at [facility]   PATIENT NAME: Kyle LennoxMarvin [name]    MR#:  [id number]  DATE OF BIRTH:  1946/09/26  SUBJECTIVE:   Patient being treated for depression with ECT treatments at this time. On medical service as he is unable to ambulate due to weakness.  Weakness improving and he is able to ambulate with 1 person assist.  Taking PO well.   REVIEW OF SYSTEMS:  Review of Systems  Constitutional: Negative for fever and chills.  HENT: Negative for ear discharge and nosebleeds.   Eyes: Negative for blurred vision and double vision.  Respiratory: Negative for cough, shortness of breath and wheezing.   Cardiovascular: Negative for chest pain, palpitations and leg swelling.  Gastrointestinal: Negative for nausea, vomiting, abdominal pain, diarrhea and constipation.  Genitourinary: Negative for dysuria.  Neurological: Positive for weakness (Generalized). Negative for dizziness, speech change, focal weakness, seizures and headaches.  Psychiatric/Behavioral: Positive for depression. Negative for suicidal ideas.    DRUG ALLERGIES:  No Known Allergies  VITALS:  Blood pressure 133/59, pulse 54, temperature 97.5 F (36.4 C), temperature source Oral, resp. rate 18, height 5\' 8"  (1.727 m), weight 93.441 kg (206 lb), SpO2 99 %.  PHYSICAL EXAMINATION:  Physical Exam  GENERAL:  69 y.o.-year-old patient sitting up in chair in no acute distress.  EYES: Pupils equal, round, reactive to light and accommodation. No scleral icterus. Extraocular muscles intact.  HEENT: Head atraumatic, normocephalic. Oropharynx and nasopharynx clear.  NECK:  Supple, no jugular venous distention. No thyroid enlargement, no tenderness.  LUNGS: Normal breath sounds bilaterally, no wheezing, rales, rhonchi. No use of accessory muscles of respiration.  CARDIOVASCULAR: S1, S2 normal. No murmurs, rubs, or gallops.  ABDOMEN: Soft, nontender, nondistended. Bowel sounds present. No  organomegaly or mass.  EXTREMITIES: No pedal edema, cyanosis, or clubbing.  NEUROLOGIC: Cranial nerves II through XII are intact. No focal motor or sensory deficits appreciated bilaterally.  Able To ambulate with 1 person assist  Bilateral upper extremity tremors noted at rest. PSYCHIATRIC: The patient is alert and oriented x 3. Flat affect.  SKIN: No obvious rash, lesion, or ulcer.    LABORATORY PANEL:   CBC  Recent Labs Lab 10/11/15 0512  WBC 12.2*  HGB 11.3*  HCT 34.0*  PLT 368   ------------------------------------------------------------------------------------------------------------------  Chemistries   Recent Labs Lab 10/11/15 0512  NA 139  K 3.7  CL 106  CO2 26  GLUCOSE 99  BUN 9  CREATININE 0.60*  CALCIUM 9.2   ------------------------------------------------------------------------------------------------------------------  Cardiac Enzymes No results for input(s): TROPONINI in the last 168 hours. ------------------------------------------------------------------------------------------------------------------  RADIOLOGY:  No results found.   ASSESSMENT AND PLAN:   69 year old male with past medical history significant for major depression with prior behavioral medicine admissions and [facility], hypertension and BPH admitted to the hospital secondary to weakness and noted to be depressed.  #1 generalized weakness-Likely reason being his depression. -Patient was able to ambulate with the help of one person assist yesterday. She does not need skilled nursing/services as this is not a rehabilitation need and likely psychosomatic in nature. -Continue to Encourage ambulation.  -MRI of the brain done with and without contrast does not show any acute findings  #2 nonsustained ventricular tachycardia-appreciate cardiology consult. No further arrhythmias noted. -Recent echocardiogram with normal ejection fraction EF of 60%,  diastolic dysfunction noted. No valvular abnormalities noted. -cont. Amiodarone, Metoprolol.   #3 major depression with suicidal ideation-multiple admissions to behavioral medicine in the past.  -  Triggered by wife's recent death. -Continue sitter at bedside. Appreciate psych consult. -Continue ECT treatments as per psych -Continue Effexor, Zyprexa, lithium.  #4 benign essential tremors - cont. Primidone  #5 BPH - cont. Flomax.   #6 DM - cont. Metformin.    Apparently cannot be discharged to behavioral medicine with 1 person assist and they don't have staffing for that in behavioral medicine.  Will discuss with psych and if they can d/c ECT treatments and d/c sitter then likely can be discharged to SNF.    All the records are reviewed and case discussed with Care Management/Social Workerr. Management plans discussed with the patient, family and they are in agreement.  CODE STATUS: Full code  TOTAL TIME TAKING CARE OF THIS PATIENT: 25 minutes.   POSSIBLE D/C IN 2-3 DAYS to behavioral medicine,  DEPENDING ON CLINICAL CONDITION.     Houston Siren M.D on 10/12/2015 at 2:06 PM  Between 7am to 6pm - Pager - [phone]  After 6pm go to [email or web address] - password [email or web address] [facility]  [facility]  Office  [phone]  CC: Primary care physician; No PCP Per Patient

## 2015-10-13 LAB — GLUCOSE, CAPILLARY
Glucose-Capillary: 61 mg/dL — ABNORMAL LOW (ref 65–99)
Glucose-Capillary: 139 mg/dL — ABNORMAL HIGH (ref 65–99)
Glucose-Capillary: 64 mg/dL — ABNORMAL LOW (ref 65–99)
Glucose-Capillary: 138 mg/dL — ABNORMAL HIGH (ref 65–99)
Glucose-Capillary: 99 mg/dL (ref 65–99)

## 2015-10-13 LAB — BASIC METABOLIC PANEL
Anion gap: 6 (ref 5–15)
BUN: 8 mg/dL (ref 6–20)
CHLORIDE: 106 mmol/L (ref 101–111)
CO2: 27 mmol/L (ref 22–32)
CREATININE: 0.61 mg/dL (ref 0.61–1.24)
Calcium: 9.4 mg/dL (ref 8.9–10.3)
GFR calc Af Amer: >60 mL/min (ref >60)
GFR calc non Af Amer: >60 mL/min (ref >60)
GLUCOSE: 90 mg/dL (ref 65–99)
POTASSIUM: 3.5 mmol/L (ref 3.5–5.1)
Sodium: 139 mmol/L (ref 135–145)

## 2015-10-13 NOTE — Care Management Important Message (Signed)
Important Message  Patient Details  Name: Suezanne JacquetMarvin D Porrata MRN: [id number] Date of Birth: Jul 09, 1946   Medicare Important Message Given:  Yes    Saahas Hidrogo A, RN 10/13/2015, 9:44 AM

## 2015-10-13 NOTE — Progress Notes (Signed)
SUBJECTIVE: Feeling much better   Filed Vitals:   10/12/15 1521 10/12/15 1946 10/13/15 0356 10/13/15 0908  BP: 130/65 139/63 109/45 132/56  Pulse: 55 53 45 56  Temp: 97.9 F (36.6 C) 98.2 F (36.8 C) 98 F (36.7 C) 98.1 F (36.7 C)  TempSrc: Oral Oral Axillary Axillary  Resp: 18 20 18 20   Height:      Weight:      SpO2: 98% 98% 94% 98%    Intake/Output Summary (Last 24 hours) at 10/13/15 1219 Last data filed at 10/13/15 1206  Gross per 24 hour  Intake   4571 ml  Output      0 ml  Net   4571 ml    LABS: Basic Metabolic Panel:  Recent Labs  16/08/9610/18/16 0512 10/13/15 0346  NA 139 139  K 3.7 3.5  CL 106 106  CO2 26 27  GLUCOSE 99 90  BUN 9 8  CREATININE 0.60* 0.61  CALCIUM 9.2 9.4   Liver Function Tests: No results for input(s): AST, ALT, ALKPHOS, BILITOT, PROT, ALBUMIN in the last 72 hours. No results for input(s): LIPASE, AMYLASE in the last 72 hours. CBC:  Recent Labs  10/11/15 0512  WBC 12.2*  HGB 11.3*  HCT 34.0*  MCV 95.5  PLT 368   Cardiac Enzymes: No results for input(s): CKTOTAL, CKMB, CKMBINDEX, TROPONINI in the last 72 hours. BNP: Invalid input(s): POCBNP D-Dimer: No results for input(s): DDIMER in the last 72 hours. Hemoglobin A1C: No results for input(s): HGBA1C in the last 72 hours. Fasting Lipid Panel: No results for input(s): CHOL, HDL, LDLCALC, TRIG, CHOLHDL, LDLDIRECT in the last 72 hours. Thyroid Function Tests: No results for input(s): TSH, T4TOTAL, T3FREE, THYROIDAB in the last 72 hours.  Invalid input(s): FREET3 Anemia Panel: No results for input(s): VITAMINB12, FOLATE, FERRITIN, TIBC, IRON, RETICCTPCT in the last 72 hours.   PHYSICAL EXAM General: Well developed, well nourished, in no acute distress HEENT:  Normocephalic and atramatic Neck:  No JVD.  Lungs: Clear bilaterally to auscultation and percussion. Heart: HRRR . Normal S1 and S2 without gallops or murmurs.  Abdomen: Bowel sounds are positive, abdomen soft and  non-tender  Msk:  Back normal, normal gait. Normal strength and tone for age. Extremities: No clubbing, cyanosis or edema.   Neuro: Alert and oriented X 3. Psych:  Good affect, responds appropriately  TELEMETRY: Sinus rhythm  ASSESSMENT AND PLAN: Nonsustained ventricular tachycardia, continue amiodarone may go home with follow-up in the office and will do outpatient Lexiscan Myoview to rule out coronary artery disease. Will see the patient this Friday at 10 AM.  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

## 2015-10-13 NOTE — Progress Notes (Signed)
MD Sainani notified that pt walked without walker around nurses station with the sitter standby.

## 2015-10-13 NOTE — Progress Notes (Signed)
[facility] [place] [facility]   PATIENT NAME: Kyle LennoxMarvin [name]    MR#:  [id number]  DATE OF BIRTH:  12/13/1945  SUBJECTIVE:   Patient being treated for depression with ECT treatments at this time. On medical service as he was  unable to ambulate due to weakness.  Much improved now and ambulating without assistance. Taking PO well. No acute issues overnight.   REVIEW OF SYSTEMS:  Review of Systems  Constitutional: Negative for fever and chills.  HENT: Negative for ear discharge and nosebleeds.   Eyes: Negative for blurred vision and double vision.  Respiratory: Negative for cough, shortness of breath and wheezing.   Cardiovascular: Negative for chest pain, palpitations and leg swelling.  Gastrointestinal: Negative for nausea, vomiting, abdominal pain, diarrhea and constipation.  Genitourinary: Negative for dysuria.  Neurological: Positive for weakness (Generalized). Negative for dizziness, speech change, focal weakness, seizures and headaches.  Psychiatric/Behavioral: Positive for depression. Negative for suicidal ideas.    DRUG ALLERGIES:  No Known Allergies  VITALS:  Blood pressure 132/56, pulse 56, temperature 98.1 F (36.7 C), temperature source Axillary, resp. rate 20, height 5\' 8"  (1.727 m), weight 93.441 kg (206 lb), SpO2 98 %.  PHYSICAL EXAMINATION:  Physical Exam  GENERAL:  69 y.o.-year-old patient sitting up in bed in no acute distress.  EYES: Pupils equal, round, reactive to light and accommodation. No scleral icterus. Extraocular muscles intact.  HEENT: Head atraumatic, normocephalic. Oropharynx and nasopharynx clear.  NECK:  Supple, no jugular venous distention. No thyroid enlargement, no tenderness.  LUNGS: Normal breath sounds bilaterally, no wheezing, rales, rhonchi. No use of accessory muscles of respiration.  CARDIOVASCULAR: S1, S2 normal. No murmurs, rubs, or gallops.  ABDOMEN: Soft, nontender, nondistended. Bowel sounds  present. No organomegaly or mass.  EXTREMITIES: No pedal edema, cyanosis, or clubbing.  NEUROLOGIC: Cranial nerves II through XII are intact. No focal motor or sensory deficits appreciated bilaterally.  Able To ambulate independently now.  Bilateral upper extremity tremors noted at rest. PSYCHIATRIC: The patient is alert and oriented x 3. Flat affect.  SKIN: No obvious rash, lesion, or ulcer.    LABORATORY PANEL:   CBC  Recent Labs Lab 10/11/15 0512  WBC 12.2*  HGB 11.3*  HCT 34.0*  PLT 368   ------------------------------------------------------------------------------------------------------------------  Chemistries   Recent Labs Lab 10/13/15 0346  NA 139  K 3.5  CL 106  CO2 27  GLUCOSE 90  BUN 8  CREATININE 0.61  CALCIUM 9.4   ------------------------------------------------------------------------------------------------------------------  Cardiac Enzymes No results for input(s): TROPONINI in the last 168 hours. ------------------------------------------------------------------------------------------------------------------  RADIOLOGY:  No results found.   ASSESSMENT AND PLAN:   69 year old male with past medical history significant for major depression with prior behavioral medicine admissions and [facility], hypertension and BPH admitted to the hospital secondary to weakness and noted to be depressed.  #1 generalized weakness- due to his depression. - now able to ambulate without assistance and independently.   -MRI of the brain done with and without contrast does not show any acute findings  #2 nonsustained ventricular tachycardia-appreciate cardiology consult. No further arrhythmias noted. -Recent echocardiogram with normal ejection fraction EF of 60%, diastolic dysfunction noted. No valvular abnormalities noted. -cont. Amiodarone, Metoprolol.   #3 major depression with suicidal ideation-multiple admissions to  behavioral medicine in the past.  -Triggered by wife's recent death. -Continue sitter at bedside. Appreciate psych consult. -Continue ECT treatments as per psych -Continue Effexor, Zyprexa, lithium.  #4 benign essential tremors -  cont. Primidone  #5 BPH - cont. Flomax.   #6 DM - cont. Metformin.    Will discuss w/ Psych tomorrow and can likely be discharged to behavioral medicine tomorrow since ambulating independently now.    All the records are reviewed and case discussed with Care Management/Social Workerr. Management plans discussed with the patient, family and they are in agreement.  CODE STATUS: Full code  TOTAL TIME TAKING CARE OF THIS PATIENT: 25 minutes.   POSSIBLE D/C IN 1-2 DAYS to behavioral medicine,  DEPENDING ON CLINICAL CONDITION.     Houston Siren M.D on 10/13/2015 at 1:17 PM  Between 7am to 6pm - Pager - [phone]  After 6pm go to [email or web address] - password [email or web address] [facility]  [place] [facility]  Office  [phone]  CC: Primary care physician; No PCP Per Patient

## 2015-10-13 NOTE — Progress Notes (Addendum)
Pt medically stable  To transfer to behavoral med. rn spoke with chg nurse cliff who stated they have beds but not appropriate staffing to transfer pt down to unit today

## 2015-10-13 NOTE — Progress Notes (Signed)
Pt walked without walker with stand by assist with the sitter around the nurses station. Will continue to monitor.

## 2015-10-13 NOTE — Care Management Note (Signed)
Case Management Note  Patient Details  Name: Kyle [name] MRN: [id number] Date of Birth: Apr 09, 1946  Subjective/Objective:     Now ambulating well without assistance. Next ECT scheduled for tomorrow. [facility]                Action/Plan:   Expected Discharge Date:                  Expected Discharge Plan:     In-House Referral:  Clinical Social Work  Discharge planning Services  CM Consult  Post Acute Care Choice:  Home Health, Durable Medical Equipment Choice offered to:  Patient  DME Arranged:    DME Agency:     HH Arranged:    HH Agency:     Status of Service:  In process, will continue to follow  Medicare Important Message Given:  Yes Date Medicare IM Given:    Medicare IM give by:    Date Additional Medicare IM Given:    Additional Medicare Important Message give by:     If discussed at Long Length of Stay Meetings, dates discussed:    Additional Comments:  Shaneal Barasch A, RN 10/13/2015, 4:48 PM

## 2015-10-13 NOTE — Progress Notes (Signed)
Pts BG 64 this evening, MD Sainani notified, per MD d/c Metformin. Will continue to monitor.

## 2015-10-14 ENCOUNTER — Inpatient Hospital Stay: Payer: Medicare Other

## 2015-10-14 ENCOUNTER — Encounter: Payer: Self-pay | Admitting: *Deleted

## 2015-10-14 ENCOUNTER — Inpatient Hospital Stay: Payer: Medicare Other | Admitting: *Deleted

## 2015-10-14 LAB — GLUCOSE, CAPILLARY
GLUCOSE-CAPILLARY: 101 mg/dL — AB (ref 65–99)
GLUCOSE-CAPILLARY: 114 mg/dL — AB (ref 65–99)
Glucose-Capillary: 145 mg/dL — ABNORMAL HIGH (ref 65–99)
Glucose-Capillary: 87 mg/dL (ref 65–99)

## 2015-10-14 MED ORDER — KETAMINE HCL 10 MG/ML IJ SOLN
100.0000 mg | Freq: Once | INTRAMUSCULAR | Status: AC
Start: 2015-10-14 — End: 2015-10-14
  Administered 2015-10-14: 100 mg via INTRAVENOUS

## 2015-10-14 MED ORDER — SUCCINYLCHOLINE CHLORIDE 20 MG/ML IJ SOLN
120.0000 mg | Freq: Once | INTRAMUSCULAR | Status: AC
  Administered 2015-10-14: 120 mg via INTRAVENOUS
  Filled 2015-10-14: qty 6

## 2015-10-14 MED ORDER — SODIUM CHLORIDE 0.9 % IV SOLN
250.0000 mL | Freq: Once | INTRAVENOUS | Status: AC
  Administered 2015-10-14: 250 mL via INTRAVENOUS

## 2015-10-14 MED ORDER — LIDOCAINE HCL (CARDIAC) 20 MG/ML IV SOLN
4.0000 mg | Freq: Once | INTRAVENOUS | Status: AC
  Administered 2015-10-14: 4 mg via INTRAVENOUS
  Filled 2015-10-14: qty 0.2

## 2015-10-14 MED ORDER — DEXTROSE 5 % IV SOLN
INTRAVENOUS | Status: DC | PRN
  Administered 2015-10-14: 11:00:00 via INTRAVENOUS

## 2015-10-14 MED ORDER — TAMSULOSIN HCL 0.4 MG PO CAPS
0.4000 mg | ORAL_CAPSULE | Freq: Every day | ORAL | Status: DC
Start: 2015-10-14 — End: 2015-10-22

## 2015-10-14 NOTE — Anesthesia Preprocedure Evaluation (Signed)
Anesthesia Evaluation  Patient identified by MRN, date of birth, ID band Patient awake    Reviewed: Allergy & Precautions, NPO status , Patient's Chart, lab work & pertinent test results  Airway Mallampati: III  TM Distance: <3 FB Neck ROM: Limited  Mouth opening: Limited Mouth Opening  Dental  (+) Teeth Intact   Pulmonary    Pulmonary exam normal        Cardiovascular Exercise Tolerance: Poor hypertension, Pt. on medications and Pt. on home beta blockers Normal cardiovascular exam     Neuro/Psych Depression Bipolar Disorder    GI/Hepatic   Endo/Other  diabetes, Type 2BG 101.  Renal/GU      Musculoskeletal   Abdominal (+) + obese,   Peds  Hematology   Anesthesia Other Findings   Reproductive/Obstetrics                             Anesthesia Physical Anesthesia Plan  ASA: III  Anesthesia Plan: General   Post-op Pain Management:    Induction: Intravenous  Airway Management Planned: Mask  Additional Equipment:   Intra-op Plan:   Post-operative Plan:   Informed Consent: I have reviewed the patients History and Physical, chart, labs and discussed the procedure including the risks, benefits and alternatives for the proposed anesthesia with the patient or authorized representative who has indicated his/her understanding and acceptance.     Plan Discussed with: CRNA  Anesthesia Plan Comments: (Looks like he could be difficult to mask.)        Anesthesia Quick Evaluation

## 2015-10-14 NOTE — Progress Notes (Signed)
   SUBJECTIVE: Pt states he is "feeling well" today, no CP or SOB.   Filed Vitals:   10/13/15 1913 10/14/15 0608 10/14/15 0752 10/14/15 0810  BP: 147/59 140/61 133/48 138/50  Pulse: 53 58 62 60  Temp: 97.7 F (36.5 C) 98.4 F (36.9 C) 98.2 F (36.8 C)   TempSrc: Oral Oral Oral   Resp: 18 18 18    Height:      Weight:      SpO2: 98% 97% 94%     Intake/Output Summary (Last 24 hours) at 10/14/15 0829 Last data filed at 10/14/15 0108  Gross per 24 hour  Intake   4000 ml  Output    700 ml  Net   3300 ml    LABS: Basic Metabolic Panel:  Recent Labs  16/08/9610/20/16 0346  NA 139  K 3.5  CL 106  CO2 27  GLUCOSE 90  BUN 8  CREATININE 0.61  CALCIUM 9.4   Liver Function Tests: No results for input(s): AST, ALT, ALKPHOS, BILITOT, PROT, ALBUMIN in the last 72 hours. No results for input(s): LIPASE, AMYLASE in the last 72 hours. CBC: No results for input(s): WBC, NEUTROABS, HGB, HCT, MCV, PLT in the last 72 hours. Cardiac Enzymes: No results for input(s): CKTOTAL, CKMB, CKMBINDEX, TROPONINI in the last 72 hours. BNP: Invalid input(s): POCBNP D-Dimer: No results for input(s): DDIMER in the last 72 hours. Hemoglobin A1C: No results for input(s): HGBA1C in the last 72 hours. Fasting Lipid Panel: No results for input(s): CHOL, HDL, LDLCALC, TRIG, CHOLHDL, LDLDIRECT in the last 72 hours. Thyroid Function Tests: No results for input(s): TSH, T4TOTAL, T3FREE, THYROIDAB in the last 72 hours.  Invalid input(s): FREET3 Anemia Panel: No results for input(s): VITAMINB12, FOLATE, FERRITIN, TIBC, IRON, RETICCTPCT in the last 72 hours.   PHYSICAL EXAM General: Well developed, well nourished, in no acute distress HEENT: Normocephalic and atramatic Neck: No JVD.  Lungs: Clear bilaterally to auscultation and percussion. Heart: HRRR . Normal S1 and S2 without gallops or murmurs.  Abdomen: Bowel sounds are positive, abdomen soft and non-tender  Extremities: No clubbing, cyanosis or  edema.   ASSESSMENT AND PLAN:  1. NS VT: Continue amiodarone PO 400mg  daily. Pt will likely need NST as outpatient to evaluate cause of VT, although it is likely metabolic. Pt given outpatient f/u 11/22 at 2pm.    Patient and plan discussed with supervising provider, Dr. Adrian BlackwaterShaukat Khan, who agrees with above findings.   Alinda SierrasEileen A. Margarito CourserRomano, PA-C [facility]  10/14/2015 8:29 AM

## 2015-10-14 NOTE — Progress Notes (Signed)
Dr Toni Amendlapacs aware of pt garbled speech and c/o chest pain. Dr Toni Amendlapacs will see pt this afternoon.

## 2015-10-14 NOTE — Progress Notes (Signed)
PT Cancellation Note  Patient Details Name: [name] MRN: [id number] DOB: 01-Feb-1946   Cancelled Treatment:    Reason Eval/Treat Not Completed: Patient at procedure or test/unavailable. Chart reviewed and RN consulted. Attempted to see patient but he is currently off unit for ECT. Will attempt at later time/date as patient is appropriate.  Sharalyn InkJason D Loyda Costin PT, DPT   Narciso Stoutenburg 10/14/2015, 11:46 AM

## 2015-10-14 NOTE — Progress Notes (Signed)
Bilateral hand/arm tremors noted.

## 2015-10-14 NOTE — Anesthesia Postprocedure Evaluation (Signed)
Anesthesia Post Note  Patient: Kyle [name]  Procedure(s) Performed: * No procedures listed *  Patient location during evaluation: PACU Anesthesia Type: General Level of consciousness: awake, awake and alert and oriented Pain management: pain level controlled Vital Signs Assessment: post-procedure vital signs reviewed and stable Respiratory status: spontaneous breathing Cardiovascular status: blood pressure returned to baseline Anesthetic complications: no    Last Vitals:  Filed Vitals:   10/14/15 0810 10/14/15 0922  BP: 138/50 140/63  Pulse: 60 61  Temp:  37.1 C  Resp:      Last Pain:  Filed Vitals:   10/14/15 0926  PainSc: 0-No pain                 Lameisha Schuenemann

## 2015-10-14 NOTE — [facility] Suicide Risk Assessment (Signed)
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 Noncompliance [Z91.19] 09/26/2015  . Ventricular tachycardia (HCC) [I47.2] 09/25/2015  . UTI (urinary tract infection) [N39.0] 09/06/2015  . RLS (restless legs syndrome) [G25.81] 08/08/2015  . Peripheral neuropathy (HCC) [G62.9] 08/08/2015  . Cerebrovascular disease [I67.9] 08/07/2015  . 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 Essential tremor [G25.0] 08/01/2015  . Hypertension [I10] 04/17/2015     Continued Clinical Symptoms:    The "Alcohol Use Disorders Identification Test", Guidelines for Use in Primary Care, Second Edition.  World Science writerHealth Organization [facility](WHO). Score between 0-7:  no or low risk or alcohol related problems. Score between 8-15:  moderate risk of alcohol related problems. Score between 16-19:  high risk of alcohol related problems. Score 20 or above:  warrants further diagnostic evaluation for alcohol dependence and treatment.   CLINICAL FACTORS:   Bipolar Disorder:   Depressive phase Depression:   Anhedonia Hopelessness   Musculoskeletal: Strength & Muscle Tone: decreased Gait & Station: broad based Patient leans: N/A  Psychiatric Specialty Exam: Physical Exam  ROS  Blood pressure 148/54, pulse 72, temperature 98.4 F (36.9 C), temperature source Oral, resp. rate 18, height 5\' 8"  (1.727 m), weight 93.441 kg (206 lb), SpO2 96 %.Body mass index is 31.33 kg/(m^2).  General Appearance: Disheveled  Eye SolicitorContact::  Fair  Speech:  Slow  Volume:  Decreased  Mood:  Depressed  Affect:  Flat  Thought Process:  Linear  Orientation:   Full (Time, Place, and Person)  Thought Content:  Negative  Suicidal Thoughts:  No  Homicidal Thoughts:  No  Memory:  Immediate;   Good Recent;   Fair Remote;   Fair  Judgement:  Fair  Insight:  Fair  Psychomotor Activity:  Decreased  Concentration:  Fair  Recall:  FiservFair  Fund of Knowledge:Fair  Language: Fair  Akathisia:  No  Handed:  Right  AIMS (if indicated):     Assets:  Desire for Improvement Housing Resilience  Sleep:     Cognition: Impaired,  Mild  ADL's:  Intact     COGNITIVE FEATURES THAT CONTRIBUTE TO RISK:  None    SUICIDE RISK:   Mild:  Suicidal ideation of limited frequency, intensity, duration, and specificity.  There are no identifiable plans, no associated intent, mild dysphoria and related symptoms, good self-control (both objective and subjective assessment), few other risk factors, and identifiable protective factors, including available and accessible social support.  PLAN OF CARE: Patient is being transferred from the medical service to the psychiatry service. Plan is to continue current ECT treatment and involve him in group and individual therapy. Continue current medicine. We need to start working aggressively on appropriate discharge planning.  Medical Decision Making:  Established Problem, Stable/Improving (1), Review and summation of old records (2), Review of Last Therapy Session (1), Review or order medicine tests (1) and Review of Medication Regimen & Side Effects (2)  I certify that inpatient services furnished can reasonably be expected to improve the patient's condition.   Kyle [name] 10/14/2015, 4:10 PM

## 2015-10-14 NOTE — Progress Notes (Signed)
Dr Dimple Caseyice aware of pt c/o chest pain. Orders for O2 [place].
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
## 2015-10-14 NOTE — Progress Notes (Signed)
Dr. Cherlynn KaiserSainani notified of BP 138/50 HR 60 and patient due 400 mg oral Amiodorone. Ok to give per Dr. Cherlynn KaiserSainani

## 2015-10-14 NOTE — Anesthesia Postprocedure Evaluation (Signed)
Anesthesia Post Note  Patient: Kyle [name]  Procedure(s) Performed: * No procedures listed *  Patient location during evaluation: PACU Anesthesia Type: General Level of consciousness: awake and alert Pain management: pain level controlled Vital Signs Assessment: post-procedure vital signs reviewed and stable Respiratory status: spontaneous breathing Cardiovascular status: blood pressure returned to baseline Postop Assessment: No headache Anesthetic complications: no    Last Vitals:  Filed Vitals:   10/14/15 1217 10/14/15 1228  BP: 134/89 143/74  Pulse: 86 80  Temp: 37.4 C   Resp: 18 18    Last Pain:  Filed Vitals:   10/14/15 1231  PainSc: 0-No pain                 Raylene Carmickle M

## 2015-10-14 NOTE — Transfer of Care (Signed)
Immediate Anesthesia Transfer of Care Note  Patient: Kyle [name]  Procedure(s) Performed: * No procedures listed *  Patient Location: PACU  Anesthesia Type:General  Level of Consciousness: sedated  Airway & Oxygen Therapy: Patient Spontanous Breathing and Patient connected to face mask oxygen  Post-op Assessment: Report given to RN and Post -op Vital signs reviewed and stable  Post vital signs: Reviewed and stable  Last Vitals: 98% sat 86 hr 21 resp 134/89 Filed Vitals:   10/14/15 0810 10/14/15 0922  BP: 138/50 140/63  Pulse: 60 61  Temp:  37.1 C  Resp:      Complications: No apparent anesthesia complications

## 2015-10-14 NOTE — Progress Notes (Signed)
Pts BP 138/50. PA Marjean DonnaEileen at nurses station, per PA ok to give Amiodarone. Will continue to monitor.

## 2015-10-14 NOTE — Discharge Summary (Signed)
[facility] [place] at [facility]   PATIENT NAME: Kyle LennoxMarvin [name]    MR#:  [id number]  DATE OF BIRTH:  12-30-1945  DATE OF ADMISSION:  09/25/2015 ADMITTING PHYSICIAN: Auburn BilberryShreyang Patel, MD  DATE OF DISCHARGE: 10/14/2015  PRIMARY CARE PHYSICIAN: No PCP Per Patient    ADMISSION DIAGNOSIS:  SVT  DISCHARGE DIAGNOSIS:  Principal Problem:   Bipolar I disorder depressed with melancholic features (HCC) Active Problems:   Ventricular tachycardia (HCC)   Noncompliance   SECONDARY DIAGNOSIS:   Past Medical History  Diagnosis Date  . 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 Bipolar 1 disorder (HCC)   . HTN   . Cerebrovascular disease   . Neuropathy (HCC)   . SVT (supraventricular tachycardia) [facility](HCC)     HOSPITAL COURSE:   69 year old male with past medical history significant for major depression with prior behavioral medicine admissions and also admission [facility], hypertension and BPH admitted to the hospital secondary to weakness and noted to be depressed.  #1 generalized weakness- this was likely secondary to depression. Patient had no evidence of acute focal medical reason for his worsening weakness. -Since he could not ambulate he was admitted to the medical floor. He was receiving care from psychiatry and getting ECT treatments. - Patient had MRI of the brain done with and without contrast did not show any acute findings -Patient's clinical symptoms have significantly improved and pt. Is ambulating now without any assistance and therefore is being discharged to behavioral medicine.  #2 nonsustained ventricular tachycardia- patient was seen by cardiology who did not recommend any acute intervention. They would do a outpatient functional study once his psychiatric issues are resolved. -Clinically presently patient is asymptomatic with no acute chest pain, palpitations. -He will continue Amiodarone, Metoprolol.  -patient had  echocardiogram which showed normal ejection fraction at 60%.  #3 major depression with suicidal ideation- patient has had multiple admissions to behavioral medicine in the past.  - this was Triggered by wife's recent death. -Patient was seen by psychiatry and placed on involuntary commitment. He had a bedside sitter while in the hospital. He received ECT treatments as per psychiatry. He has clinically improved and is ambulating without assistance now and therefore being discharged to behavioral medicine.  - patient will Continue Effexor, Zyprexa, lithium.  #4 benign essential tremors - this is chronic for the patient and continue follow-up as an outpatient.   #5 BPH -  no evidence of urinary retention and patient will continue Flomax.    #6 DM -  patient had one episode of hypoglycemia but he was asymptomatic. Blood sugars have since then remained stable and he will continue his metformin.   #7 restless leg syndrome-patient will continue his Requip   Patient is now being discharged to behavioral medicine for further care for his underlying depression.      CONSULTS OBTAINED:  Treatment Team:  Laurier NancyShaukat A Khan, MD Audery AmelJohn T Clapacs, MD  DRUG ALLERGIES:  No Known Allergies  DISCHARGE MEDICATIONS:   Current Discharge Medication List    START taking these medications   Details  amiodarone (PACERONE) 400 MG tablet Take 1 tablet (400 mg total) by mouth daily. Qty: 30 tablet, Refills: 0    metoprolol tartrate (LOPRESSOR) 25 MG tablet Take 1 tablet (25 mg total) by mouth 2 (two) times daily. Qty: 60 tablet, Refills: 0    tamsulosin (FLOMAX) 0.4 MG CAPS capsule Take 1 capsule (0.4 mg total) by mouth  daily. Qty: 30 capsule      CONTINUE these medications which have NOT CHANGED   Details  amLODipine (NORVASC) 10 MG tablet Take 1 tablet (10 mg total) by mouth daily. Qty: 30 tablet, Refills: 0    aspirin 81 MG chewable tablet Chew 1 tablet (81 mg total) by mouth daily. Qty: 30  tablet, Refills: 0    gabapentin (NEURONTIN) 400 MG capsule Take 1 capsule (400 mg total) by mouth 3 (three) times daily. Qty: 90 capsule, Refills: 0    lithium carbonate (LITHOBID) 300 MG CR tablet Take 600 mg by mouth at bedtime.    LORazepam (ATIVAN) 1 MG tablet Take 1 tablet (1 mg total) by mouth at bedtime. Qty: 30 tablet, Refills: 0    metFORMIN (GLUCOPHAGE) 500 MG tablet Take 1 tablet (500 mg total) by mouth 2 (two) times daily with a meal. Qty: 60 tablet, Refills: 0    !! OLANZapine (ZYPREXA) 10 MG tablet Take 10 mg by mouth at bedtime. Pt takes with a  tablet.    !! OLANZapine (ZYPREXA) 20 MG tablet Take 20 mg by mouth at bedtime. Pt takes with a  tablet.    primidone (MYSOLINE) 50 MG tablet Take 1 tablet (50 mg total) by mouth at bedtime. Qty: 30 tablet, Refills: 0    simvastatin (ZOCOR) 10 MG tablet Take 1 tablet (10 mg total) by mouth daily at 6 PM. Qty: 30 tablet, Refills: 0    venlafaxine XR (EFFEXOR-XR) 150 MG 24 hr capsule Take 1 capsule (150 mg total) by mouth daily with breakfast. Qty: 30 capsule, Refills: 0    lidocaine (LIDODERM) 5 % Place 1 patch onto the skin daily. Remove & Discard patch within 12 hours or as directed by MD Qty: 30 patch, Refills: 0    rOPINIRole (REQUIP) 0.25 MG tablet Take 1 tablet (0.25 mg total) by mouth at bedtime. Qty: 30 tablet, Refills: 0     !! - Potential duplicate medications found. Please discuss with provider.    STOP taking these medications     divalproex (DEPAKOTE ER) 500 MG 24 hr tablet      propranolol ER (INDERAL LA) 80 MG 24 hr capsule          DISCHARGE INSTRUCTIONS:   DIET:  Cardiac diet and Diabetic diet  DISCHARGE CONDITION:  Stable  ACTIVITY:  Activity as tolerated  OXYGEN:  Home Oxygen: No.   Oxygen Delivery: room air  DISCHARGE LOCATION:  Behavioral medicine    If you experience worsening of your admission symptoms, develop shortness of breath, life threatening emergency, suicidal  or homicidal thoughts you must seek medical attention immediately by calling 911 or calling your MD immediately  if symptoms less severe.  You Must read complete instructions/literature along with all the possible adverse reactions/side effects for all the Medicines you take and that have been prescribed to you. Take any new Medicines after you have completely understood and accpet all the possible adverse reactions/side effects.   Please note  You were cared for by a hospitalist during your hospital stay. If you have any questions about your discharge medications or the care you received while you were in the hospital after you are discharged, you can call the unit and asked to speak with the hospitalist on call if the hospitalist that took care of you is not available. Once you are discharged, your primary care physician will handle any further medical issues. Please note that NO REFILLS for any discharge medications will be  authorized once you are discharged, as it is imperative that you return to your primary care physician (or establish a relationship with a primary care physician if you do not have one) for your aftercare needs so that they can reassess your need for medications and monitor your lab values.     Today    walking independently without assistance. No acute events overnight. No chest pain, shortness of breath.  VITAL SIGNS:  Blood pressure 137/65, pulse 65, temperature 98.6 F (37 C), temperature source Oral, resp. rate 18, height  (1.727 m), weight 93.441 kg (206 lb), SpO2 96 %.  I/O:   Intake/Output Summary (Last 24 hours) at 10/14/15 1507 Last data filed at 10/14/15 1419  Gross per 24 hour  Intake   2751 ml  Output   1100 ml  Net   1651 ml    PHYSICAL EXAMINATION:   GENERAL: 69 y.o.-year-old patient sitting up in chair in no acute distress.  EYES: Pupils equal, round, reactive to light and accommodation. No scleral icterus. Extraocular muscles intact.   HEENT: Head atraumatic, normocephalic. Oropharynx and nasopharynx clear.  NECK: Supple, no jugular venous distention. No thyroid enlargement, no tenderness.  LUNGS: Normal breath sounds bilaterally, no wheezing, rales, rhonchi. No use of accessory muscles of respiration.  CARDIOVASCULAR: S1, S2 normal. No murmurs, rubs, or gallops.  ABDOMEN: Soft, nontender, nondistended. Bowel sounds present. No organomegaly or mass.  EXTREMITIES: No pedal edema, cyanosis, or clubbing.  NEUROLOGIC: Cranial nerves II through XII are intact. No focal motor or sensory deficits appreciated bilaterally. Able To ambulate independently.   Bilateral upper extremity tremors noted at rest. PSYCHIATRIC: The patient is alert and oriented x 3. Flat affect.  SKIN: No obvious rash, lesion, or ulcer.   DATA REVIEW:   CBC  Recent Labs Lab 10/11/15 0512  WBC 12.2*  HGB 11.3*  HCT 34.0*  PLT 368    Chemistries   Recent Labs Lab 10/13/15 0346  NA 139  K 3.5  CL 106  CO2 27  GLUCOSE 90  BUN 8  CREATININE 0.61  CALCIUM 9.4    Cardiac Enzymes No results for input(s): TROPONINI in the last 168 hours.   RADIOLOGY:  No results found.    Management plans discussed with the patient, family and they are in agreement.  CODE STATUS:     Code Status Orders        Start     Ordered   09/25/15 1729  Full code   Continuous     09/25/15 1729      TOTAL TIME TAKING CARE OF THIS PATIENT: 40 minutes.    Houston Siren M.D on 10/14/2015 at 3:07 PM  Between 7am to 6pm - Pager - [phone]  After 6pm go to [email or web address] - password [email or web address] [facility]  [facility]  Office  [phone]  CC: Primary care physician; No PCP Per Patient

## 2015-10-14 NOTE — BHH Counselor (Addendum)
Spoke with Dede (1A) to communicate that Pt will be admitted to inpatient unit at midnight.  Pt. [facility] by Dr. Toni Amendlapacs. Attending Physician will be Dr. Toni Amendlapacs.  [facility]. [facility] Victorino DikeJennifer.

## 2015-10-14 NOTE — Procedures (Signed)
ECT SERVICES Physician's Interval Evaluation & Treatment Note  Patient Identification: Kyle [name] MRN:  [id number] Date of Evaluation:  10/14/2015 TX #: 5  MADRS:   MMSE:   P.E. Findings:  Continues to be weak but his ambulation is improving  Psychiatric Interval Note:  Mood and interaction improving  Subjective:  Patient is a 69 y.o. male seen for evaluation for Electroconvulsive Therapy. Tremor no other specific complaint  Treatment Summary:   []   Right Unilateral             [x]  Bilateral   % Energy : 1.0 ms 100%   Impedance: 1010 ohms  Seizure Energy Index: 5002 V squared  Postictal Suppression Index: 84%  Seizure Concordance Index: 97%  Medications  Pre Shock: Xylocaine 4 mg, ketamine 100 mg, succinylcholine 120 mg  Post Shock: None  Seizure Duration: 17 seconds by EMG, 32 seconds by EEG   Comments: Continue treatment Wednesday possible transfer to behavioral health unit before then   Lungs:  [x]   Clear to auscultation               []  Other:   Heart:    [x]   Regular rhythm             []  irregular rhythm    [x]   Previous H&P reviewed, patient examined and there are NO CHANGES                 []   Previous H&P reviewed, patient examined and there are changes noted.   Mordecai RasmussenJohn Twanda Stakes, MD 11/21/201611:56 AM

## 2015-10-15 ENCOUNTER — Inpatient Hospital Stay
Admit: 2015-10-15 | Discharge: 2015-10-22 | DRG: 885 | Disposition: A | Discharge status: Home / Self Care | Payer: Medicare Other | Source: Intra-hospital | Attending: Psychiatry | Admitting: Psychiatry

## 2015-10-15 DIAGNOSIS — F319 Bipolar disorder, unspecified: Principal | ICD-10-CM | POA: Diagnosis present

## 2015-10-15 DIAGNOSIS — E119 Type 2 diabetes mellitus without complications: Secondary | ICD-10-CM | POA: Diagnosis present

## 2015-10-15 DIAGNOSIS — I10 Essential (primary) hypertension: Secondary | ICD-10-CM | POA: Diagnosis present

## 2015-10-15 DIAGNOSIS — Z818 Family history of other mental and behavioral disorders: Secondary | ICD-10-CM

## 2015-10-15 DIAGNOSIS — Z888 Allergy status to other drugs, medicaments and biological substances status: Secondary | ICD-10-CM | POA: Diagnosis not present

## 2015-10-15 DIAGNOSIS — Z9049 Acquired absence of other specified parts of digestive tract: Secondary | ICD-10-CM

## 2015-10-15 DIAGNOSIS — Z79899 Other long term (current) drug therapy: Secondary | ICD-10-CM | POA: Diagnosis not present

## 2015-10-15 DIAGNOSIS — F3130 Bipolar disorder, current episode depressed, mild or moderate severity, unspecified: Secondary | ICD-10-CM

## 2015-10-15 DIAGNOSIS — Z7982 Long term (current) use of aspirin: Secondary | ICD-10-CM | POA: Diagnosis not present

## 2015-10-15 DIAGNOSIS — G250 Essential tremor: Secondary | ICD-10-CM | POA: Diagnosis present

## 2015-10-15 DIAGNOSIS — G629 Polyneuropathy, unspecified: Secondary | ICD-10-CM

## 2015-10-15 LAB — HEMOGLOBIN A1C: Hgb A1c MFr Bld: 5.2 % (ref 4.0–6.0)

## 2015-10-15 LAB — GLUCOSE, CAPILLARY
GLUCOSE-CAPILLARY: 87 mg/dL (ref 65–99)
GLUCOSE-CAPILLARY: 76 mg/dL (ref 65–99)
Glucose-Capillary: 89 mg/dL (ref 65–99)
Glucose-Capillary: 94 mg/dL (ref 65–99)

## 2015-10-15 LAB — LIPID PANEL
CHOL/HDL RATIO: 4.4 ratio
CHOLESTEROL: 194 mg/dL (ref 0–200)
HDL: 44 mg/dL (ref >40)
LDL CALC: 130 mg/dL — AB (ref 0–99)
Triglycerides: 98 mg/dL (ref <150)
VLDL: 20 mg/dL (ref 0–40)

## 2015-10-15 MED ORDER — DOCUSATE SODIUM 100 MG PO CAPS
100.0000 mg | ORAL_CAPSULE | Freq: Two times a day (BID) | ORAL | Status: DC
  Administered 2015-10-15 – 2015-10-22 (×15): 100 mg via ORAL
  Filled 2015-10-15 (×15): qty 1

## 2015-10-15 MED ORDER — ASPIRIN 81 MG PO CHEW
81.0000 mg | CHEWABLE_TABLET | Freq: Every day | ORAL | Status: DC
  Administered 2015-10-15 – 2015-10-22 (×8): 81 mg via ORAL
  Filled 2015-10-15 (×8): qty 1

## 2015-10-15 MED ORDER — OLANZAPINE 10 MG PO TABS
20.0000 mg | ORAL_TABLET | Freq: Every day | ORAL | Status: DC
  Administered 2015-10-15 – 2015-10-21 (×7): 20 mg via ORAL
  Filled 2015-10-15 (×8): qty 2

## 2015-10-15 MED ORDER — ALUM & MAG HYDROXIDE-SIMETH 200-200-20 MG/5ML PO SUSP: 30.0000 mL | ORAL | Status: DC | PRN

## 2015-10-15 MED ORDER — HYDROCODONE-ACETAMINOPHEN 5-325 MG PO TABS
1.0000 | ORAL_TABLET | ORAL | Status: DC | PRN
  Administered 2015-10-17: 1 via ORAL
  Filled 2015-10-15: qty 1

## 2015-10-15 MED ORDER — METOPROLOL TARTRATE 25 MG PO TABS
25.0000 mg | ORAL_TABLET | Freq: Two times a day (BID) | ORAL | Status: DC
  Administered 2015-10-15 – 2015-10-18 (×5): 25 mg via ORAL
  Filled 2015-10-15 (×7): qty 1

## 2015-10-15 MED ORDER — INSULIN ASPART 100 UNIT/ML ~~LOC~~ SOLN
0.0000 [IU] | Freq: Three times a day (TID) | SUBCUTANEOUS | Status: DC
  Administered 2015-10-17: 1 [IU] via SUBCUTANEOUS
  Administered 2015-10-17: 2 [IU] via SUBCUTANEOUS
  Filled 2015-10-15: qty 2
  Filled 2015-10-15: qty 1

## 2015-10-15 MED ORDER — AMIODARONE HCL 200 MG PO TABS
400.0000 mg | ORAL_TABLET | Freq: Every day | ORAL | Status: DC
  Administered 2015-10-15 – 2015-10-21 (×7): 400 mg via ORAL
  Filled 2015-10-15 (×7): qty 2

## 2015-10-15 MED ORDER — GABAPENTIN 300 MG PO CAPS
300.0000 mg | ORAL_CAPSULE | Freq: Two times a day (BID) | ORAL | Status: DC
  Administered 2015-10-15 – 2015-10-22 (×15): 300 mg via ORAL
  Filled 2015-10-15 (×15): qty 1

## 2015-10-15 MED ORDER — ACETAMINOPHEN 325 MG PO TABS
650.0000 mg | ORAL_TABLET | Freq: Four times a day (QID) | ORAL | Status: DC | PRN
  Administered 2015-10-15: 650 mg via ORAL
  Filled 2015-10-15: qty 2

## 2015-10-15 MED ORDER — MAGNESIUM HYDROXIDE 400 MG/5ML PO SUSP: 30.0000 mL | Freq: Every day | ORAL | Status: DC | PRN

## 2015-10-15 MED ORDER — TAMSULOSIN HCL 0.4 MG PO CAPS
0.4000 mg | ORAL_CAPSULE | Freq: Every day | ORAL | Status: DC
  Administered 2015-10-15 – 2015-10-21 (×7): 0.4 mg via ORAL
  Filled 2015-10-15 (×7): qty 1

## 2015-10-15 MED ORDER — POLYETHYLENE GLYCOL 3350 17 G PO PACK: 17.0000 g | PACK | Freq: Every day | ORAL | Status: DC | PRN

## 2015-10-15 MED ORDER — INSULIN ASPART 100 UNIT/ML ~~LOC~~ SOLN: 0.0000 [IU] | Freq: Every day | SUBCUTANEOUS | Status: DC

## 2015-10-15 MED ORDER — VENLAFAXINE HCL ER 75 MG PO CP24
300.0000 mg | ORAL_CAPSULE | Freq: Every day | ORAL | Status: DC
  Administered 2015-10-15 – 2015-10-22 (×8): 300 mg via ORAL
  Filled 2015-10-15 (×8): qty 4

## 2015-10-15 MED ORDER — LITHIUM CARBONATE ER 450 MG PO TBCR
1200.0000 mg | EXTENDED_RELEASE_TABLET | Freq: Every day | ORAL | Status: DC
  Administered 2015-10-15 – 2015-10-17 (×3): 1200 mg via ORAL
  Filled 2015-10-15: qty 2
  Filled 2015-10-15 (×3): qty 1

## 2015-10-15 NOTE — Progress Notes (Signed)
[place] [facility] [place].

## 2015-10-15 NOTE — Progress Notes (Signed)
[facility] to correlate SpO2, RN stated patient is alert and is ambulatory. Patient resting comfortably during spot check.

## 2015-10-15 NOTE — Progress Notes (Signed)
Patient transferred from the medical unit via wheelchair with one person assist. Per BHT, states she was unable to obtain vital signs dt patients b/l tremors to both arms. Pt is alert and oriented states he's tired and ready to go to bed. Pt reports lower ext weakness but was able to amb to bathroom. Void freely. Skin checked with another nurse, skin tear to midline lower back and covered with dry dressing. B/p 130/72, HR 48 (lying). Pulse ox 81% RA @0300 .  Respiratory notified Pulse ox 93% RA, HR 48 @0310 . Dr Toni Amendlapacs notified. Recheck blood pressure at 0600. Pt resting comfortable in bed. No s/s of respiratory distress noted. Skin warm and dry to touch. Pt searched for contraband, none found. Denies SI/HI/AVH. No c/o pain/discomfort noted. Slept 4.0 hours

## 2015-10-15 NOTE — Plan of Care (Signed)
Problem: Alteration in mood Goal: LTG-Patient reports reduction in suicidal thoughts (Patient reports reduction in suicidal thoughts and is able to verbalize a safety plan for whenever patient is feeling suicidal)  Outcome: Not Progressing Patient seems very depressed this shift and is reporting fleeting thoughts of SI without a plan this shift

## 2015-10-15 NOTE — BHH Group Notes (Signed)
BHH Group Notes:  (Nursing/MHT/Case Management/Adjunct)  Date:  10/15/2015  Time:  2:29 PM  Type of Therapy:  Psychoeducational Skills  Participation Level:  Did Not Attend   Kyle CourseWhitney R Rachel [name] 10/15/2015, 2:29 PM
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
## 2015-10-15 NOTE — Progress Notes (Signed)
Recreation Therapy Notes  Date: 11.22.16 Time: 3:00 pm Location: Craft room  Group Topic: Goal Setting  Goal Area(s) Addresses:  Patient will write at least one goal. Patient will write at least one obstacle.  Behavioral Response: Did not attend  Intervention: Recovery Goal Chart  Activity: Patients were instructed to make a recovery goal chart including goals, obstacles, the date they started working on their goals, and the date they achieved their goals.  Education: LRT educated patients on healthy ways they can celebrate reaching their goals.  Education Outcome: Patient did not attend group.  Clinical Observations/Feedback: Patient did not attend group.  Jacquelynn CreeGreene,Dynver Clemson M, LRT/CTRS 10/15/2015 4:28 PM

## 2015-10-15 NOTE — Progress Notes (Signed)
Spontaneous eyes opening to prompt, CBG=89, Orthostatic @ 121/62, HR=59 (Lying); 163/68, HR=59 (Sitting).

## 2015-10-15 NOTE — Tx Team (Signed)
Initial Interdisciplinary Treatment Plan   PATIENT STRESSORS: Health problems Loss of spouse   PATIENT STRENGTHS: Manufacturing systems engineerCommunication skills Motivation for treatment/growth   PROBLEM LIST: Problem List/Patient Goals Date to be addressed Date deferred Reason deferred Estimated date of resolution  depression 10/15/2015     Suicidal ideation 10/15/2015                                                DISCHARGE CRITERIA:  Adequate post-discharge living arrangements Verbal commitment to aftercare and medication compliance  PRELIMINARY DISCHARGE PLAN: Return to previous living arrangement  PATIENT/FAMIILY INVOLVEMENT: This treatment plan has been presented to and reviewed with the patient, Kyle [name], and/or family member.  The patient and family have been given the opportunity to ask questions and make suggestions.  [name] 10/15/2015, 5:08 AM

## 2015-10-15 NOTE — Progress Notes (Signed)
D: patient remained in the room for most of the shift.  Patient denies any SI at this time but states that depression has remained the same.  Patient still experiencing uncontrolled tremors.  Patient is medication compliant.  Patient in no distress at this time A: support and encouragement provided.  Medications given as prescribed.  q 15 min checks done  R: patient is receptive of information given

## 2015-10-16 ENCOUNTER — Inpatient Hospital Stay: Admit: 2015-10-16 | Discharge: 2015-10-16 | Disposition: A | Discharge status: Home / Self Care | Payer: Medicare Other

## 2015-10-16 ENCOUNTER — Inpatient Hospital Stay: Payer: Medicare Other | Admitting: Certified Registered Nurse Anesthetist

## 2015-10-16 LAB — GLUCOSE, CAPILLARY
GLUCOSE-CAPILLARY: 97 mg/dL (ref 65–99)
GLUCOSE-CAPILLARY: 91 mg/dL (ref 65–99)
GLUCOSE-CAPILLARY: 80 mg/dL (ref 65–99)
Glucose-Capillary: 80 mg/dL (ref 65–99)

## 2015-10-16 MED ORDER — LIDOCAINE HCL (CARDIAC) 20 MG/ML IV SOLN
4.0000 mg | Freq: Once | INTRAVENOUS | Status: AC
  Administered 2015-10-16: 4 mg via INTRAVENOUS

## 2015-10-16 MED ORDER — KETAMINE HCL 10 MG/ML IJ SOLN
100.0000 mg | Freq: Once | INTRAMUSCULAR | Status: AC
  Administered 2015-10-16: 100 mg via INTRAVENOUS

## 2015-10-16 MED ORDER — SUCCINYLCHOLINE CHLORIDE 20 MG/ML IJ SOLN
120.0000 mg | Freq: Once | INTRAMUSCULAR | Status: AC
  Administered 2015-10-16: 120 mg via INTRAVENOUS

## 2015-10-16 MED ORDER — SODIUM CHLORIDE 0.9 % IV SOLN
250.0000 mL | Freq: Once | INTRAVENOUS | Status: AC
Start: 2015-10-16 — End: 2015-10-16
  Administered 2015-10-16: 250 mL via INTRAVENOUS

## 2015-10-16 NOTE — [facility] Group Notes (Signed)
[facility] Group Notes:  (Nursing/MHT/Case Management/Adjunct)  Date:  10/16/2015  Time:  12:00 PM  Type of Therapy:  Psychoeducational Skills  Participation Level:  Did Not Attend    Kyle [name] 10/16/2015, 12:00 PM

## 2015-10-16 NOTE — Anesthesia Preprocedure Evaluation (Addendum)
Anesthesia Evaluation  Patient identified by MRN, date of birth, ID band Patient awake    Reviewed: Allergy & Precautions, NPO status , Patient's Chart, lab work & pertinent test results  Airway Mallampati: III  TM Distance: <3 FB Neck ROM: Limited  Mouth opening: Limited Mouth Opening  Dental  (+) Teeth Intact   Pulmonary    Pulmonary exam normal        Cardiovascular Exercise Tolerance: Poor hypertension, Normal cardiovascular exam     Neuro/Psych PSYCHIATRIC DISORDERS Depression Bipolar Disorder  Neuromuscular disease    GI/Hepatic   Endo/Other  diabetes, Type 2BG 101.  Renal/GU      Musculoskeletal   Abdominal (+) + obese,   Peds  Hematology   Anesthesia Other Findings Pt did fall in Nov. Has had a hx of V tach fir a long time.  Reproductive/Obstetrics                             Anesthesia Physical Anesthesia Plan  ASA: III  Anesthesia Plan: General   Post-op Pain Management:    Induction:   Airway Management Planned: Simple Face Mask  Additional Equipment:   Intra-op Plan:   Post-operative Plan:   Informed Consent:   Plan Discussed with: CRNA  Anesthesia Plan Comments:         Anesthesia Quick Evaluation

## 2015-10-16 NOTE — Procedures (Signed)
ECT SERVICES Physician's Interval Evaluation & Treatment Note  Patient Identification: Kyle [name] MRN:  [id number] Date of Evaluation:  10/16/2015 TX #: 6  MADRS:   MMSE:   P.E. Findings:  Patient continues to have significant amounts of tremor. Still improving on his ambulation.  Psychiatric Interval Note:  Mood is stated as improved  Subjective:  Patient is a 69 y.o. male seen for evaluation for Electroconvulsive Therapy. No specific complaints other than his tremor  Treatment Summary:   []   Right Unilateral             [x]  Bilateral   % Energy : 1.0 ms 100%   Impedance: 840 ohms  Seizure Energy Index: 2344 V squared  Postictal Suppression Index: 88%  Seizure Concordance Index: 85%  Medications  Pre Shock: Xylocaine 4 mg, ketamine 100 mg, succinylcholine 120 mg  Post Shock: None  Seizure Duration: 10 seconds by EMG, 15 seconds by EEG   Comments: Patient is back to having an inadequate length of seizure. Not clear what else we could do to improve the situation. We will reevaluate over the long weekend. He may be at the point of considering discharge.   Lungs:  [x]   Clear to auscultation               []  Other:   Heart:    [x]   Regular rhythm             []  irregular rhythm    [x]   Previous H&P reviewed, patient examined and there are NO CHANGES                 []   Previous H&P reviewed, patient examined and there are changes noted.   Kyle RasmussenJohn Lorean Ekstrand, MD 11/23/201610:57 AM
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
## 2015-10-16 NOTE — Plan of Care (Signed)
Problem: Diagnosis: Increased Risk For Suicide Attempt Goal: STG-Patient Will Comply With Medication Regime Outcome: Progressing Pt takes medications as prescribed

## 2015-10-16 NOTE — Plan of Care (Signed)
Problem: Alteration in mood Goal: LTG-Patient reports reduction in suicidal thoughts (Patient reports reduction in suicidal thoughts and is able to verbalize a safety plan for whenever patient is feeling suicidal)  Outcome: Progressing Denies suicidal ideation.     
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
## 2015-10-16 NOTE — Transfer of Care (Signed)
Immediate Anesthesia Transfer of Care Note  Patient: Kyle [name]  Procedure(s) Performed: * No procedures listed *  Patient Location: PACU  Anesthesia Type:General  Level of Consciousness: sedated  Airway & Oxygen Therapy: Patient Spontanous Breathing and Patient connected to face mask oxygen  Post-op Assessment: Report given to RN and Post -op Vital signs reviewed and stable  Post vital signs: Reviewed and stable  Last Vitals:  Filed Vitals:   10/16/15 0917 10/16/15 1115  BP: 145/53 160/71  Pulse: 55 61  Temp: 36.1 C 37.3 C  Resp:  19    Complications: No apparent anesthesia complications

## 2015-10-16 NOTE — Progress Notes (Signed)
D: Pt in room upon approach. Denies SI/HI/AVH at this time. Pt rates his pain as a 2 out of 10. Pt is experiencing tremors bilaterally in his hands. Pt takes medications as prescribed. No concerns or complaints verbalized. A: Support and encouragement given. Medications given as prescribed. Pt NPO after midnight due to ECT tomorrow. q15 minute safety checks maintained. R: Pt remains free from harm

## 2015-10-16 NOTE — Anesthesia Procedure Notes (Signed)
Procedure Name: LMA Insertion Date/Time: 10/16/2015 11:01 AM Performed by: Ginger CarneMICHELET, Marciana Uplinger Pre-anesthesia Checklist: Patient identified, Timeout performed, Emergency Drugs available, Patient being monitored and Suction available Patient Re-evaluated:Patient Re-evaluated prior to inductionOxygen Delivery Method: Circle system utilized Preoxygenation: Pre-oxygenation with 100% oxygen Intubation Type: IV induction Ventilation: Oral airway inserted - appropriate to patient size LMA: LMA inserted LMA Size: 4.5 Tube type: Oral Number of attempts: 1 Placement Confirmation: positive ETCO2 Dental Injury: Teeth and Oropharynx as per pre-operative assessment

## 2015-10-16 NOTE — Anesthesia Postprocedure Evaluation (Signed)
Anesthesia Post Note  Patient: Kyle [name]  Procedure(s) Performed: * No procedures listed *  Patient location during evaluation: PACU Anesthesia Type: General Pain management: pain level controlled Vital Signs Assessment: post-procedure vital signs reviewed and stable Respiratory status: spontaneous breathing Cardiovascular status: stable Postop Assessment: No headache Anesthetic complications: no    Last Vitals:  Filed Vitals:   10/16/15 1115 10/16/15 1139  BP: 160/71   Pulse: 61 59  Temp: 37.3 C 36.8 C  Resp: 19 22    Last Pain:  Filed Vitals:   10/16/15 1141  PainSc: 0-No pain                 Zebulin Siegel S

## 2015-10-16 NOTE — Progress Notes (Signed)
Patient tolerated ECT.Noticed some memory loss after treatment.Denies depression & suicidal ideation.Minimal interactions with peers.Noticeable tremors on both hands.Compliant with medications.Appetite good.

## 2015-10-17 LAB — GLUCOSE, CAPILLARY
GLUCOSE-CAPILLARY: 78 mg/dL (ref 65–99)
GLUCOSE-CAPILLARY: 129 mg/dL — AB (ref 65–99)
GLUCOSE-CAPILLARY: 168 mg/dL — AB (ref 65–99)
GLUCOSE-CAPILLARY: 108 mg/dL — AB (ref 65–99)

## 2015-10-17 NOTE — Progress Notes (Signed)
Seclusive to room. Denies SI.  Affect flat.  Medication compliant.

## 2015-10-17 NOTE — Plan of Care (Signed)
Problem: Spiritual Needs Goal: Ability to function at adequate level Outcome: Progressing Performs ADL's appropriately.  Up to day room for meals and snacks.  Ambulates to medication room to receive
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
## 2015-10-17 NOTE — Plan of Care (Signed)
Problem: Diagnosis: Increased Risk For Suicide Attempt Goal: LTG-Patient Will Show Positive Response to Medication LTG (by discharge) : Patient will show positive response to medication and will participate in the development of the discharge plan.  Outcome: Progressing Medication compliant.  Denies any thoughts of SI     

## 2015-10-17 NOTE — Progress Notes (Signed)
D: Pt denies SI/HI/AVH. Pt is pleasant and cooperative, affect flat ad sad. Patient noted to have bilateral hand tremors , he appears anxious and forgetfull at times, minimal interaction with peers and staff .  A: Pt was offered support and encouragement. Pt was given scheduled medications. Pt was encouraged to attend groups. Q 15 minute checks were done for safety.  R:Pt does not attend groups.Pt is taking medication.Pt receptive to treatment and safety maintained on unit.

## 2015-10-17 NOTE — Plan of Care (Signed)
Problem: Alteration in mood Goal: LTG-Patient reports reduction in suicidal thoughts (Patient reports reduction in suicidal thoughts and is able to verbalize a safety plan for whenever patient is feeling suicidal)  Outcome: Progressing Patient denies SI/HI.      

## 2015-10-18 LAB — GLUCOSE, CAPILLARY
GLUCOSE-CAPILLARY: 92 mg/dL (ref 65–99)
Glucose-Capillary: 84 mg/dL (ref 65–99)
Glucose-Capillary: 91 mg/dL (ref 65–99)
Glucose-Capillary: 68 mg/dL (ref 65–99)

## 2015-10-18 MED ORDER — LITHIUM CARBONATE ER 300 MG PO TBCR
600.0000 mg | EXTENDED_RELEASE_TABLET | Freq: Every day | ORAL | Status: DC
  Administered 2015-10-18 – 2015-10-21 (×4): 600 mg via ORAL
  Filled 2015-10-18 (×4): qty 2

## 2015-10-18 MED ORDER — PRIMIDONE 50 MG PO TABS
25.0000 mg | ORAL_TABLET | Freq: Every day | ORAL | Status: DC
  Administered 2015-10-18 – 2015-10-19 (×2): 25 mg via ORAL
  Filled 2015-10-18: qty 0.5
  Filled 2015-10-18: qty 1
  Filled 2015-10-18: qty 0.5

## 2015-10-18 MED ORDER — PROPRANOLOL HCL ER 60 MG PO CP24
60.0000 mg | ORAL_CAPSULE | Freq: Every day | ORAL | Status: DC
  Administered 2015-10-19 – 2015-10-22 (×4): 60 mg via ORAL
  Filled 2015-10-18 (×5): qty 1

## 2015-10-18 NOTE — [facility] Group Notes (Signed)
[facility] Group Notes:  (Nursing/MHT/Case Management/Adjunct)  Date:  10/18/2015  Time:  6:11 PM  Type of Therapy:  Psychoeducational Skills  Participation Level:  Active  Participation Quality:  Attentive  Affect:  Appropriate  Cognitive:  Alert  Insight:  Appropriate  Engagement in Group:  Engaged  Modes of Intervention:  Education and Support  Summary of Progress/Problems:  Kyle [name] 10/18/2015, 6:11 PM

## 2015-10-18 NOTE — Plan of Care (Signed)
Problem: Alteration in mood Goal: LTG-Patient reports reduction in suicidal thoughts (Patient reports reduction in suicidal thoughts and is able to verbalize a safety plan for whenever patient is feeling suicidal)  Outcome: Progressing Patient denies SI/HI.      

## 2015-10-18 NOTE — Progress Notes (Signed)
Patient verbalizes that he needs something for his tremors, he can't hold a cup of water because he shakes so bad.  Dr. Ardyth HarpsHernandez made aware.  Denies SI/HI/AVH.  Affect flat and intense. Medication compliant, no group attendance. Isolates to his room except meals and medication pass.
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
## 2015-10-18 NOTE — Progress Notes (Signed)
D: Pt denies SI/HI/AVH. Pt is pleasant and cooperative, affect flat and sad, appears less anxious and he is interacting with peers and staff appropriately.  A: Pt was offered support and encouragement. Pt was given scheduled medications. Pt was encouraged to attend groups. Q 15 minute checks were done for safety.  R:Pt does not attend group. Pt is taking medication. Pt has no complaints.Pt receptive to treatment and safety maintained on unit.

## 2015-10-18 NOTE — [facility] Group Notes (Signed)
[facility] LCSW Group Therapy  10/18/2015 2:54 PM  Type of Therapy:  Group Therapy  Participation Level:  Did Not Attend   Sebastion Jun T, MSW, LCSWA 10/18/2015, 2:54 PM  

## 2015-10-18 NOTE — Progress Notes (Signed)
D: Patient affect is flat and he appears depressed. When I appeared to patient he said he didn't get up for supper but this was not true. Chart stated he ate 20%. He did get up for snack. A: Medication was given with education. Encouragement was provided. Patient was told of snack time. Patient was given water and complained he was very thirsty. R: Patient was compliant with medication. Expressed his needs.

## 2015-10-18 NOTE — Plan of Care (Signed)
Problem: Alteration in mood Goal: LTG-Patient reports reduction in suicidal thoughts (Patient reports reduction in suicidal thoughts and is able to verbalize a safety plan for whenever patient is feeling suicidal)  Outcome: Progressing Denies SI     
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
## 2015-10-19 LAB — GLUCOSE, CAPILLARY
GLUCOSE-CAPILLARY: 98 mg/dL (ref 65–99)
GLUCOSE-CAPILLARY: 98 mg/dL (ref 65–99)
Glucose-Capillary: 75 mg/dL (ref 65–99)
Glucose-Capillary: 76 mg/dL (ref 65–99)

## 2015-10-19 NOTE — Plan of Care (Signed)
Problem: Ineffective individual coping Goal: STG: Patient will remain free from self harm Patient has remained free from harm since admission.      
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
## 2015-10-19 NOTE — [facility] Group Notes (Signed)
[facility] LCSW Group Therapy  10/19/2015 1:09 PM  Type of Therapy:  Group Therapy  Participation Level:  Minimal  Participation Quality:  Attentive  Affect:  Flat  Cognitive:  Alert  Insight:  Limited  Engagement in Therapy:  Limited  Modes of Intervention:  Discussion, Education, Socialization and Support  Summary of Progress/Problems: Todays topic: Grudges  Patients will be encouraged to discuss their thoughts, feelings, and behaviors as to why one holds on to grudges and reasons why people have grudges. Patients will process the impact of grudges on their daily lives and identify thoughts and feelings related to holding grudges. Patients will identify feelings and thoughts related to what life would look like without grudges. Kyle KaufmanMarvin attended group and stayed the entire time. He was only concerned about going home.   [name] L [name] MSW, LCSWA  10/19/2015, 1:09 PM

## 2015-10-19 NOTE — Plan of Care (Signed)
Problem: Ineffective individual coping Goal: STG: Patient will remain free from self harm Outcome: Progressing Patient medication compliant, cooperative with groups and observed in the dayroom interacting briefly with his peers.

## 2015-10-19 NOTE — Progress Notes (Signed)
Pt. noted to be mildly confused, he was however cooperative with medications, meals and groups. Pt. c/o of hand tremors and requested for medication. MD, Dr. Mayford KnifeWilliams notified and he spoke with the pt.(Medication already in place for bedtime administration). No Falls reported.

## 2015-10-20 LAB — GLUCOSE, CAPILLARY
GLUCOSE-CAPILLARY: 79 mg/dL (ref 65–99)
Glucose-Capillary: 90 mg/dL (ref 65–99)
Glucose-Capillary: 77 mg/dL (ref 65–99)
Glucose-Capillary: 123 mg/dL — ABNORMAL HIGH (ref 65–99)

## 2015-10-20 MED ORDER — PRIMIDONE 50 MG PO TABS
50.0000 mg | ORAL_TABLET | Freq: Every day | ORAL | Status: DC
  Administered 2015-10-20 – 2015-10-21 (×2): 50 mg via ORAL
  Filled 2015-10-20 (×2): qty 1

## 2015-10-20 NOTE — Plan of Care (Signed)
Problem: Ineffective individual coping Goal: STG-Increase in ability to manage activities of daily living Outcome: Progressing Pt is independent with ADL's and walks unassisted on the unit.

## 2015-10-20 NOTE — Progress Notes (Addendum)
D: patient has been assessed and observed in his room for most of the shift.  Patient has been med compliant.  Patient is reporting no SI or HI at this time.  Patient in no distress at this time  A: support and encouragement provided.  Medications given as prescribed.  q 15 min checks done  R: patient receptive of information given
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
## 2015-10-20 NOTE — Progress Notes (Signed)
D: Pt is awake and active in the milieu this evening. Pt mood is anxious and his affect is sad. Pt denies SI/HI and AVH at this time.  A: Writer provided emotional support and administered medications as prescribed.   R: Pt spent time watching TV with peers and went to bed shortly after snack and medication administration.

## 2015-10-20 NOTE — Plan of Care (Signed)
Problem: Diagnosis: Increased Risk For Suicide Attempt Goal: STG-Patient Will Comply With Medication Regime Outcome: Progressing Patient has been med compliant this shift      
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
## 2015-10-21 ENCOUNTER — Inpatient Hospital Stay: Payer: Medicare Other

## 2015-10-21 ENCOUNTER — Encounter: Payer: Self-pay | Admitting: Psychiatry

## 2015-10-21 ENCOUNTER — Inpatient Hospital Stay: Payer: Medicare Other | Admitting: Anesthesiology

## 2015-10-21 DIAGNOSIS — R001 Bradycardia, unspecified: Secondary | ICD-10-CM

## 2015-10-21 LAB — GLUCOSE, CAPILLARY
GLUCOSE-CAPILLARY: 96 mg/dL (ref 65–99)
GLUCOSE-CAPILLARY: 90 mg/dL (ref 65–99)
Glucose-Capillary: 91 mg/dL (ref 65–99)

## 2015-10-21 LAB — LITHIUM LEVEL: LITHIUM LVL: 0.6 mmol/L (ref 0.60–1.20)

## 2015-10-21 MED ORDER — SUCCINYLCHOLINE CHLORIDE 20 MG/ML IJ SOLN
120.0000 mg | Freq: Once | INTRAMUSCULAR | Status: AC
  Administered 2015-10-21: 120 mg via INTRAVENOUS

## 2015-10-21 MED ORDER — ACETAMINOPHEN 650 MG RE SUPP
650.0000 mg | Freq: Four times a day (QID) | RECTAL | Status: DC | PRN
  Filled 2015-10-21: qty 1

## 2015-10-21 MED ORDER — ACETAMINOPHEN 325 MG PO TABS: 650.0000 mg | ORAL_TABLET | Freq: Four times a day (QID) | ORAL | Status: DC | PRN

## 2015-10-21 MED ORDER — LIDOCAINE HCL (CARDIAC) 20 MG/ML IV SOLN
4.0000 mg | Freq: Once | INTRAVENOUS | Status: AC
  Administered 2015-10-21: 4 mg via INTRAVENOUS

## 2015-10-21 MED ORDER — KETAMINE HCL 10 MG/ML IJ SOLN
100.0000 mg | Freq: Once | INTRAMUSCULAR | Status: AC
  Administered 2015-10-21: 100 mg via INTRAVENOUS

## 2015-10-21 MED ORDER — METFORMIN HCL 500 MG PO TABS
500.0000 mg | ORAL_TABLET | Freq: Two times a day (BID) | ORAL | Status: DC
  Administered 2015-10-21 – 2015-10-22 (×3): 500 mg via ORAL
  Filled 2015-10-21 (×3): qty 1

## 2015-10-21 MED ORDER — SODIUM CHLORIDE 0.9 % IV SOLN
250.0000 mL | Freq: Once | INTRAVENOUS | Status: AC
  Administered 2015-10-21: 250 mL via INTRAVENOUS

## 2015-10-21 MED ORDER — AMIODARONE HCL 200 MG PO TABS
200.0000 mg | ORAL_TABLET | Freq: Every day | ORAL | Status: DC
  Administered 2015-10-21 – 2015-10-22 (×2): 200 mg via ORAL
  Filled 2015-10-21 (×2): qty 1

## 2015-10-21 NOTE — [facility] Group Notes (Signed)
[facility]  10/21/2015 4:14 PM  Type of Therapy:  Group Therapy  Participation Level:  Did Not Attend   Lulu Ridingngle, Elmon Shader T , MSW, LCSWA 10/21/2015, 4:14 PM

## 2015-10-21 NOTE — Anesthesia Procedure Notes (Signed)
Procedure Name: LMA Insertion Date/Time: 10/21/2015 11:49 AM Performed by: Lily KocherPERALTA, Kyle Chizmar Pre-anesthesia Checklist: Patient identified, Patient being monitored, Timeout performed, Emergency Drugs available and Suction available Patient Re-evaluated:Patient Re-evaluated prior to inductionOxygen Delivery Method: Circle system utilized Preoxygenation: Pre-oxygenation with 100% oxygen Intubation Type: IV induction Ventilation: Mask ventilation with difficulty and Oral airway inserted - appropriate to patient size LMA: LMA inserted LMA Size: 5.0 Tube type: Oral Number of attempts: 1 Placement Confirmation: positive ETCO2 and breath sounds checked- equal and bilateral Tube secured with: Tape Dental Injury: Teeth and Oropharynx as per pre-operative assessment

## 2015-10-21 NOTE — Progress Notes (Signed)
Recreation Therapy Notes  Date: 11.28.16 Time: 3:20 pm Location: Craft Room  Group Topic: Self-expression  Goal Area(s) Addresses:  Patient will effectively use art as a means of self-expression. Patient will recognize positive benefit of self-expression. Patient will be able to identify one emotion experienced during group session. Patient will identify use of art as a coping skill.  Behavioral Response: Attentive  Intervention: Two Faces of Me  Activity: Patients were given a blank face worksheet and instructed to draw a line down the middle. On one side of the face, patients were instructed to draw or write how they felt when they were admitted to the hospital. On the other side of the face, patients were instructed to draw or write how they want to feel when they are d/c.  Education: LRT educated patients on different forms of self-expression.  Education Outcome: In group clarification offered  Clinical Observations/Feedback: Patient completed activity by writing how he felt when he was admitted and how he wants to feel when he is d/c. Patient did not contribute to group discussion.  Jacquelynn CreeGreene,Rebekha Diveley M, LRT/CTRS 10/21/2015 4:51 PM

## 2015-10-21 NOTE — [facility] Group Notes (Signed)
[facility] Group Notes:  (Nursing/MHT/Case Management/Adjunct)  Date:  10/21/2015  Time:  2:22 PM  Type of Therapy:  Psychoeducational Skills  Participation Level:  Did Not Attend   Kyle [name] 10/21/2015, 2:22 PM

## 2015-10-21 NOTE — Plan of Care (Signed)
Problem: Alteration in mood Goal: LTG-Patient reports reduction in suicidal thoughts (Patient reports reduction in suicidal thoughts and is able to verbalize a safety plan for whenever patient is feeling suicidal)  Outcome: Progressing Denies depression & suicidal ideation.     
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
## 2015-10-21 NOTE — [facility] Group Notes (Signed)
[facility] Group Notes:  (Nursing/MHT/Case Management/Adjunct)  Date:  10/21/2015  Time:  2:52 AM  Type of Therapy:  Group Therapy  Participation Level:  Active  Participation Quality:  Appropriate  Affect:  Appropriate  Cognitive:  Appropriate  Insight:  Good and Improving  Engagement in Group:  Developing/Improving and Engaged  Modes of Intervention:  n/a  Summary of Progress/Problems:  Veva Holesshley Imani Viki Carrera 10/21/2015, 2:52 AM

## 2015-10-21 NOTE — Progress Notes (Signed)
D: Pt is awake and active in the milieu this evening. Pt mood is anxious and his affect is sad. Pt denies SI/HI and AVH at this time.  A: Writer provided emotional support and administered medications as prescribed.   R: Pt is pleasant and cooperative with staff. Pt states that he feels medications are helping with his tremors.

## 2015-10-21 NOTE — Anesthesia Postprocedure Evaluation (Signed)
Anesthesia Post Note  Patient: Kyle [name]  Procedure(s) Performed: * No procedures listed *  Anesthesia Post Evaluation  Last Vitals:  Filed Vitals:   10/21/15 1230 10/21/15 1234  BP: 114/59 117/59  Pulse: 44 47  Temp:  37.4 C  Resp: 14 15    Last Pain:  Filed Vitals:   10/21/15 1240  PainSc: 0-No pain                 Roxsana Riding M

## 2015-10-21 NOTE — Anesthesia Preprocedure Evaluation (Signed)
Anesthesia Evaluation  Patient identified by MRN, date of birth, ID band Patient awake    Reviewed: Allergy & Precautions, NPO status , Patient's Chart, lab work & pertinent test results  Airway Mallampati: III  TM Distance: >3 FB Neck ROM: Limited    Dental  (+) Teeth Intact   Pulmonary    Pulmonary exam normal        Cardiovascular Exercise Tolerance: Poor hypertension, Pt. on medications and Pt. on home beta blockers Normal cardiovascular exam     Neuro/Psych Depression Bipolar Disorder    GI/Hepatic   Endo/Other    Renal/GU      Musculoskeletal   Abdominal (+) + obese,  Abdomen: soft.    Peds  Hematology   Anesthesia Other Findings   Reproductive/Obstetrics                             Anesthesia Physical Anesthesia Plan  ASA: III  Anesthesia Plan: General   Post-op Pain Management:    Induction: Intravenous  Airway Management Planned: Mask  Additional Equipment:   Intra-op Plan:   Post-operative Plan:   Informed Consent: I have reviewed the patients History and Physical, chart, labs and discussed the procedure including the risks, benefits and alternatives for the proposed anesthesia with the patient or authorized representative who has indicated his/her understanding and acceptance.     Plan Discussed with: CRNA  Anesthesia Plan Comments:         Anesthesia Quick Evaluation

## 2015-10-21 NOTE — Procedures (Signed)
ECT SERVICES Physician's Interval Evaluation & Treatment Note  Patient Identification: Kyle [name]  MRN:  [id number] Date of Evaluation:  10/21/2015 TX #: 7  MADRS: 21  MMSE: 26  P.E. Findings:  Ambulating better. Vital signs stable. No new finding  Psychiatric Interval Note:  Mood is reported as being better energy level still appears low  Subjective:  Patient is a 69 y.o. male seen for evaluation for Electroconvulsive Therapy. Patient states he is feeling much better and is requesting discharge  Treatment Summary:   []   Right Unilateral             [x]  Bilateral   % Energy : 1.0 ms 100%   Impedance: 1000 ohms  Seizure Energy Index: 1910 V squared  Postictal Suppression Index: 80%  Seizure Concordance Index: 94%  Medications  Pre Shock: Xylocaine 4 mg ketamine 100 mg succinylcholine 120 mg  Post Shock: None  Seizure Duration: 13 seconds by EMG, 22 seconds by EEG   Comments: Patient is having ever more inadequate seizures and is requesting discharge and more lucid now. Anticipate likely plan for discharge and I know that he will have no transportation for outpatient treatment.   Lungs:  [x]   Clear to auscultation               []  Other:   Heart:    [x]   Regular rhythm             []  irregular rhythm    [x]   Previous H&P reviewed, patient examined and there are NO CHANGES                 []   Previous H&P reviewed, patient examined and there are changes noted.   Kyle RasmussenJohn Bracha Frankowski, MD 11/28/201611:43 AM

## 2015-10-21 NOTE — Transfer of Care (Signed)
Immediate Anesthesia Transfer of Care Note  Patient: Kyle [name]  Procedure(s) Performed: ECT Patient Location: PACU  Anesthesia Type:General  Level of Consciousness: sedated  Airway & Oxygen Therapy: Patient Spontanous Breathing and Patient connected to face mask oxygen  Post-op Assessment: Report given to RN  Post vital signs: Reviewed and stable  Last Vitals:  Filed Vitals:   10/21/15 0933 10/21/15 1204  BP: 135/65 123/61  Pulse: 47 48  Temp: 35.9 C 36.8 C  Resp: 20 18    Complications: No apparent anesthesia complications

## 2015-10-21 NOTE — Progress Notes (Signed)
Tolerated ECT.Denies depression & suicidal ideation.Appropriate with staff & peers.Compliant with medicines.Attended groups.Appetite good.
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
## 2015-10-21 NOTE — Progress Notes (Signed)
HR has been in the 50's since propranolol 60 mg was added on Friday (maily to target tremors).  I will decrease amiodarone from 400 mg to 200 mg and check EKG today.  Pt does not suffer from DM. HBA1c was 5.2 on 11/22.  Metformin has been prescribed to him in the past in order to prevent metabolic syndrome (as he is prescribed with olanzapine).  Will d/c CBS checks and restart metformin.

## 2015-10-22 LAB — GLUCOSE, CAPILLARY
GLUCOSE-CAPILLARY: 103 mg/dL — AB (ref 65–99)
Glucose-Capillary: 87 mg/dL (ref 65–99)
Glucose-Capillary: 72 mg/dL (ref 65–99)

## 2015-10-22 MED ORDER — DOCUSATE SODIUM 100 MG PO CAPS: 100.0000 mg | ORAL_CAPSULE | Freq: Two times a day (BID) | ORAL | Status: DC

## 2015-10-22 MED ORDER — OLANZAPINE 20 MG PO TABS: 20.0000 mg | ORAL_TABLET | Freq: Every day | ORAL | Status: DC

## 2015-10-22 MED ORDER — VENLAFAXINE HCL ER 150 MG PO CP24: 300.0000 mg | ORAL_CAPSULE | Freq: Every day | ORAL | Status: DC

## 2015-10-22 MED ORDER — PRIMIDONE 50 MG PO TABS: 50.0000 mg | ORAL_TABLET | Freq: Every day | ORAL | Status: DC

## 2015-10-22 MED ORDER — METFORMIN HCL 500 MG PO TABS: 500.0000 mg | ORAL_TABLET | Freq: Two times a day (BID) | ORAL | Status: DC

## 2015-10-22 MED ORDER — ASPIRIN 81 MG PO CHEW: 81.0000 mg | CHEWABLE_TABLET | Freq: Every day | ORAL | Status: DC

## 2015-10-22 MED ORDER — TAMSULOSIN HCL 0.4 MG PO CAPS: 0.4000 mg | ORAL_CAPSULE | Freq: Every day | ORAL | Status: DC

## 2015-10-22 MED ORDER — PROPRANOLOL HCL ER 60 MG PO CP24: 60.0000 mg | ORAL_CAPSULE | Freq: Every day | ORAL | Status: DC

## 2015-10-22 MED ORDER — LITHIUM CARBONATE ER 300 MG PO TBCR: 600.0000 mg | EXTENDED_RELEASE_TABLET | Freq: Every day | ORAL | Status: DC

## 2015-10-22 MED ORDER — AMIODARONE HCL 200 MG PO TABS: 200.0000 mg | ORAL_TABLET | Freq: Every day | ORAL | Status: DC

## 2015-10-22 NOTE — Progress Notes (Signed)
D: Pt is awake and active in the milieu this evening. Pt mood is and his affect is . Pt denies SI/HI and AVH at this time.   A: Writer provided emotional support and administered medications as prescribed. Pt denies side effects related to ECT this morning.   R: Pt is appropriate on the unit and is pleasant and cooperative with staff. Pt went to bed following evening snack.

## 2015-10-22 NOTE — Progress Notes (Signed)
Recreation Therapy Notes  Date: 11.29.16 Time: 3:00 pm Location: Craft Room  Group Topic: Goal Setting  Goal Area(s) Addresses:  Patient will write down a goal. Patient will write down at least one positive reinforcement statement. Patient will verbalize benefit of setting goals.  Behavioral Response: Did not attend  Intervention: Step By Step  Activity: Patients were given a foot worksheet and instructed to write a goal on the inside of the foot. Patients were instructed to write positive reinforcement statements on the outside of the foot to help motivate themselves to keep working on their goals.  Education: LRT educated patients on healthy ways to celebrate achieving their goals.  Education Outcome: Patient did not attend group.   Clinical Observations/Feedback: Patient did not attend group.  Jacquelynn CreeGreene,Jhene Westmoreland M, LRT/CTRS 10/22/2015 4:22 PM

## 2015-10-22 NOTE — [facility] Suicide Risk Assessment (Signed)
[facility] Discharge Suicide Risk Assessment   Demographic Factors:  Male, Age 69 or older, Divorced or widowed, Caucasian, Living alone and Unemployed  Total Time spent with patient: 35 minutes  Musculoskeletal: Strength & Muscle Tone: decreased Gait & Station: unsteady Patient leans: N/A  Psychiatric Specialty Exam: Physical Exam  Nursing note and vitals reviewed. Constitutional: He appears well-developed and well-nourished.  HENT:  Head: Normocephalic and atraumatic.  Eyes: Conjunctivae are normal. Pupils are equal, round, and reactive to light.  Neck: Normal range of motion.  Cardiovascular: Normal rate, regular rhythm and normal heart sounds.   Respiratory: Effort normal and breath sounds normal. No respiratory distress.  GI: Soft.  Musculoskeletal: Normal range of motion.  Neurological: He is alert.  Skin: Skin is warm and dry.  Psychiatric: He has a normal mood and affect. His behavior is normal. Judgment and thought content normal.    Review of Systems  Constitutional: Negative.   HENT: Negative.   Eyes: Negative.   Respiratory: Negative.   Cardiovascular: Negative.   Gastrointestinal: Negative.   Musculoskeletal: Positive for joint pain.  Skin: Negative.   Neurological: Positive for tremors.  Psychiatric/Behavioral: Negative for depression, suicidal ideas, hallucinations, memory loss and substance abuse. The patient is not nervous/anxious and does not have insomnia.     Blood pressure 133/74, pulse 53, temperature 98.4 F (36.9 C), temperature source Oral, resp. rate 18, weight 87.998 kg (194 lb), SpO2 97 %.Body mass index is 29.5 kg/(m^2).  General Appearance: Casual  Eye Contact::  Fair  Speech:  Slow409  Volume:  Decreased  Mood:  Euthymic  Affect:  Constricted  Thought Process:  Goal Directed  Orientation:  Full (Time, Place, and Person)  Thought Content:  Negative  Suicidal Thoughts:  No  Homicidal Thoughts:  No  Memory:  Immediate;   Fair Recent;    Fair Remote;   Fair  Judgement:  Fair  Insight:  Fair  Psychomotor Activity:  Decreased  Concentration:  Fair  Recall:  [employer] of Knowledge:Fair  Language: Fair  Akathisia:  No  Handed:  Right  AIMS (if indicated):     Assets:  Communication Skills Desire for Improvement Housing Resilience Social Support  Sleep:  Number of Hours: 6.75  Cognition: Impaired,  Mild  ADL's:  Intact   Have you used any form of tobacco in the last 30 days? (Cigarettes, Smokeless Tobacco, Cigars, and/or Pipes): No  Has this patient used any form of tobacco in the last 30 days? (Cigarettes, Smokeless Tobacco, Cigars, and/or Pipes) No  Mental Status Per Nursing Assessment::   On Admission:     Current Mental Status by Physician: patient currently denies any suicidal ideation at all. He states that his mood is feeling good. Does not report psychotic symptoms.  Loss Factors: Loss of significant relationship and Decline in physical health  Historical Factors: Prior suicide attempts  Risk Reduction Factors:   Religious beliefs about death  Continued Clinical Symptoms:  Bipolar Disorder:   Depressive phase Depression:   Anhedonia Insomnia  Cognitive Features That Contribute To Risk:  None    Suicide Risk:  Mild:  Suicidal ideation of limited frequency, intensity, duration, and specificity.  There are no identifiable plans, no associated intent, mild dysphoria and related symptoms, good self-control (both objective and subjective assessment), few other risk factors, and identifiable protective factors, including available and accessible social support.  Principal Problem: Bipolar I disorder depressed with melancholic features [facility]) Discharge Diagnoses:  Patient Active Problem List   Diagnosis  Date Noted  . Bipolar disorder, now depressed (HCC) [F31.30] 10/15/2015  . Noncompliance [Z91.19] 09/26/2015  . Ventricular tachycardia (HCC) [I47.2] 09/25/2015  . UTI (urinary tract infection)  [N39.0] 09/06/2015  . RLS (restless legs syndrome) [G25.81] 08/08/2015  . Peripheral neuropathy (HCC) [G62.9] 08/08/2015  . Cerebrovascular disease [I67.9] 08/07/2015  . 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 Essential tremor [G25.0] 08/01/2015  . Hypertension [I10] 04/17/2015    Follow-up Information    [facility] Go on 10/24/2015.   Why:  For follow-up care appt Thursday 10/24/15 at 9:00am. Patient may be seen in the [place] office for future appointments.   Contact information:   [address] [place], [place] [phone] Fax [phone]      Plan Of Care/Follow-up recommendations:  Activity:  patient is to gradually increase his activity. He is limited by his arthritis his tremor and his medical problems but he needs to gradually increase his physical activity or risk becoming bed bound again Diet:  patient is to stay on a heart healthy low carbohydrate low-salt diet Tests:  patient will need lithium level checked periodically in the future by outpatient provider Other:  patient has been counseled about the risks of noncompliance including return of severe depression suicidal ideation and death. Patient needs to be sure that he makes follow-up appointments. At this point he is stating an understanding and agreement to all of that  Is patient on multiple antipsychotic therapies at discharge:  No   Has Patient had three or more failed trials of antipsychotic monotherapy by history:  No  Recommended Plan for Multiple Antipsychotic Therapies: NA    [name] 10/22/2015, 1:30 PM

## 2015-10-22 NOTE — Tx Team (Signed)
Interdisciplinary Treatment Plan Update (Adult)  Date:  10/22/2015 Time Reviewed:  1:50 PM  Progress in Treatment: Attending groups: Yes. Participating in groups:  No. Taking medication as prescribed:  Yes. Tolerating medication:  Yes. Family/Significant othe contact made:  Yes, individual(s) contacted:  Raynelle Dick (sister) [phone] Patient understands diagnosis:  Yes. Discussing patient identified problems/goals with staff:  Yes. Medical problems stabilized or resolved:  Yes. Denies suicidal/homicidal ideation: Yes. Issues/concerns per patient self-inventory:  No. Other:  New problem(s) identified: No, Describe:  NA  Discharge Plan or Barriers: Pt plans to return home and follow up with outpatient provider Sharon.    Reason for Continuation of Hospitalization: Depression Medication stabilization Suicidal ideation  Comments:Kyle [name] is much improved. He is out of bed and out of his room. He interacts with peers and staff appropriately. He went to a few groups. He tolerates medications well although bitterly complains about multiple pills he has to take. It is unlikely that he would be compliant with his medications at home. The patient will be discharged tomorrow to home with home health. We were unable to place him as he has no Medicaid. Medicaid application was completed and submitted yesterday. He has no somatic complaints except for tremor. He is a diagnosis of familial tremor that is addressed with medications. Patient received final ECT yesterday 10/21/15 and will discharge today 10/22/15.  Estimated length of stay: 0 days, patient will discharge today    New goal(s):  NA  Review of initial/current patient goals per problem list:   1.  Goal(s): Patient will participate in aftercare plan * Met: Yes * Target date: at discharge * As evidenced by: Patient will participate within aftercare plan AEB aftercare provider and housing plan at discharge being  identified.   2.  Goal (s): Patient will exhibit decreased depressive symptoms and suicidal ideations. * Met: Yes *  Target date: at discharge * As evidenced by: Patient will utilize self rating of depression at 3 or below and demonstrate decreased signs of depression or be deemed stable for discharge by MD.   3.  Goal(s): Patient will demonstrate decreased signs and symptoms of anxiety. * Met: Yes * Target date: at discharge * As evidenced by: Patient will utilize self rating of anxiety at 3 or below and demonstrated decreased signs of anxiety, or be deemed stable for discharge by MD  Attendees: Physician:  Alethia Berthold, MD  11/29/20161:50 PM  Nursing:   Gardiner Barefoot, RN  11/29/20161:50 PM  Other:  Keene Breath, [place]  11/29/20161:50 PM  Other:  11/29/20161:50 PM  Other:  11/29/20161:50 PM  Other:  11/29/20161:50 PM  Other:  11/29/20161:50 PM  Other:  11/29/20161:50 PM  Other:  11/29/20161:50 PM  Other:   11/29/20161:50 PM   Scribe for Treatment Team:   Carmell Austria T MSW, [place]  10/22/2015, 1:50 PM

## 2015-10-22 NOTE — Progress Notes (Signed)
  [facility] :  Will you be returning to the same living situation after discharge:  Yes,  home At discharge, do you have transportation home?: No. Cab voucher provider Do you have the ability to pay for your medications: Yes,  patient has Medicaid and Medicare  Release of information consent forms completed and in the chart;  Patient's signature needed at discharge.  Patient to Follow up at: Follow-up Information    [facility]. Go on 10/24/2015.   Why:  For follow-up care appt Thursday 10/24/15 at 9:00am. Patient may be seen in the [place] office for future appointments.   Contact information:   [address] [place], [place] [phone] Fax [phone]      Next level of care provider [facility] to [facility] Link:no  Patient denies SI/HI: Yes,  patient denies SI/HI    Safety Planning and Suicide Prevention discussed: Yes,  SPE discussed with patient and with Clide CliffMargaret Williams (sister) [phone]  Have you used any form of tobacco in the last 30 days? (Cigarettes, Smokeless Tobacco, Cigars, and/or Pipes): No  Has patient been referred to the Quitline?: N/A patient is not a smoker  Lulu Ridingngle, Zack Crager T, MSW, LCSWA 10/22/2015, 4:58 PM

## 2015-10-22 NOTE — [facility] Group Notes (Signed)
[facility] Group Notes:  (Nursing/MHT/Case Management/Adjunct)  Date:  10/22/2015  Time:  1:56 PM  Type of Therapy:  Psychoeducational Skills  Participation Level:  Did Not Attend  Lynelle SmokeCara Travis [facility] 10/22/2015, 1:56 PM

## 2015-10-22 NOTE — [facility] Suicide Risk Assessment (Signed)
[facility] INPATIENT:  Family/Significant Other Suicide Prevention Education  Suicide Prevention Education:  Education Completed; Kyle [name] (sister) [phone] has been identified by the patient as the family member/significant other with whom the patient will be residing, and identified as the person(s) who will aid the patient in the event of a mental health crisis (suicidal ideations/suicide attempt).  With written consent from the patient, the family member/significant other has been provided the following suicide prevention education, prior to the and/or following the discharge of the patient.  The suicide prevention education provided includes the following:  Suicide risk factors  Suicide prevention and interventions  National Suicide Hotline telephone number  [facility] assessment telephone number  [facility] Emergency Assistance 911  [facility] and/or [facility]idential [facility] Crisis [facility] telephone number  Request made of family/significant other to:  Remove weapons (e.g., guns, rifles, knives), all items previously/currently identified as safety concern.    Remove drugs/medications (over-the-counter, prescriptions, illicit drugs), all items previously/currently identified as a safety concern.  The family member/significant other verbalizes understanding of the suicide prevention education information provided.  The family member/significant other agrees to remove the items of safety concern listed above.  Lulu RidingIngle, Nusaiba Guallpa T, MSW, LCSWA 10/22/2015, 4:58 PM
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
## 2016-10-01 ENCOUNTER — Encounter: Payer: Self-pay | Admitting: Emergency Medicine

## 2016-10-01 ENCOUNTER — Emergency Department
Admission: EM | Admit: 2016-10-01 | Discharge: 2016-10-02 | Payer: Medicare Other | Attending: Student in an Organized Health Care Education/Training Program | Admitting: Student in an Organized Health Care Education/Training Program

## 2016-10-01 DIAGNOSIS — Z7984 Long term (current) use of oral hypoglycemic drugs: Secondary | ICD-10-CM | POA: Insufficient documentation

## 2016-10-01 DIAGNOSIS — F329 Major depressive disorder, single episode, unspecified: Secondary | ICD-10-CM | POA: Diagnosis present

## 2016-10-01 DIAGNOSIS — Z9119 Patient's noncompliance with other medical treatment and regimen: Secondary | ICD-10-CM

## 2016-10-01 DIAGNOSIS — G250 Essential tremor: Secondary | ICD-10-CM | POA: Diagnosis present

## 2016-10-01 DIAGNOSIS — Z91199 Patient's noncompliance with other medical treatment and regimen due to unspecified reason: Secondary | ICD-10-CM

## 2016-10-01 DIAGNOSIS — F3130 Bipolar disorder, current episode depressed, mild or moderate severity, unspecified: Secondary | ICD-10-CM

## 2016-10-01 DIAGNOSIS — I10 Essential (primary) hypertension: Secondary | ICD-10-CM | POA: Diagnosis not present

## 2016-10-01 DIAGNOSIS — G2581 Restless legs syndrome: Secondary | ICD-10-CM | POA: Diagnosis present

## 2016-10-01 DIAGNOSIS — Z7982 Long term (current) use of aspirin: Secondary | ICD-10-CM | POA: Diagnosis not present

## 2016-10-01 DIAGNOSIS — F32A Depression, unspecified: Secondary | ICD-10-CM

## 2016-10-01 DIAGNOSIS — Z79899 Other long term (current) drug therapy: Secondary | ICD-10-CM | POA: Diagnosis not present

## 2016-10-01 LAB — COMPREHENSIVE METABOLIC PANEL
ALK PHOS: 69 U/L (ref 38–126)
ALT: 43 U/L (ref 17–63)
ANION GAP: 9 (ref 5–15)
AST: 34 U/L (ref 15–41)
Albumin: 4.3 g/dL (ref 3.5–5.0)
BUN: 12 mg/dL (ref 6–20)
CALCIUM: 10 mg/dL (ref 8.9–10.3)
CO2: 22 mmol/L (ref 22–32)
CREATININE: 0.64 mg/dL (ref 0.61–1.24)
Chloride: 104 mmol/L (ref 101–111)
GFR calc non Af Amer: >60 mL/min (ref >60)
GLUCOSE: 209 mg/dL — AB (ref 65–99)
Potassium: 4.1 mmol/L (ref 3.5–5.1)
Sodium: 135 mmol/L (ref 135–145)
TOTAL PROTEIN: 7.5 g/dL (ref 6.5–8.1)

## 2016-10-01 LAB — CBC
HEMATOCRIT: 44.2 % (ref 40.0–52.0)
Hemoglobin: 15 g/dL (ref 13.0–18.0)
MCH: 31.4 pg (ref 26.0–34.0)
MCHC: 33.9 g/dL (ref 32.0–36.0)
MCV: 92.6 fL (ref 80.0–100.0)
Platelets: 283 10*3/uL (ref 150–440)
RBC: 4.77 MIL/uL (ref 4.40–5.90)
RDW: 12.9 % (ref 11.5–14.5)
WBC: 11.7 10*3/uL — ABNORMAL HIGH (ref 3.8–10.6)

## 2016-10-01 LAB — ETHANOL: Alcohol, Ethyl (B): <5 mg/dL (ref <5)

## 2016-10-01 LAB — URINE DRUG SCREEN, QUALITATIVE (ARMC ONLY)
Amphetamines, Ur Screen: NOT DETECTED
BARBITURATES, UR SCREEN: NOT DETECTED
BENZODIAZEPINE, UR SCRN: NOT DETECTED
CANNABINOID 50 NG, UR ~~LOC~~: NOT DETECTED
Cocaine Metabolite,Ur ~~LOC~~: NOT DETECTED
MDMA (Ecstasy)Ur Screen: NOT DETECTED
METHADONE SCREEN, URINE: NOT DETECTED
OPIATE, UR SCREEN: NOT DETECTED
Phencyclidine (PCP) Ur S: NOT DETECTED
Tricyclic, Ur Screen: NOT DETECTED

## 2016-10-01 LAB — ACETAMINOPHEN LEVEL

## 2016-10-01 LAB — SALICYLATE LEVEL

## 2016-10-01 MED ORDER — VENLAFAXINE HCL ER 75 MG PO CP24
75.0000 mg | ORAL_CAPSULE | Freq: Every day | ORAL | Status: DC
  Administered 2016-10-02: 75 mg via ORAL
  Filled 2016-10-01: qty 1

## 2016-10-01 MED ORDER — AMIODARONE HCL 200 MG PO TABS
200.0000 mg | ORAL_TABLET | Freq: Every day | ORAL | Status: DC
  Administered 2016-10-01 – 2016-10-02 (×2): 200 mg via ORAL
  Filled 2016-10-01 (×3): qty 1

## 2016-10-01 MED ORDER — TAMSULOSIN HCL 0.4 MG PO CAPS
0.4000 mg | ORAL_CAPSULE | Freq: Every day | ORAL | Status: DC
  Administered 2016-10-01: 0.4 mg via ORAL
  Filled 2016-10-01: qty 1

## 2016-10-01 MED ORDER — ASPIRIN EC 81 MG PO TBEC
81.0000 mg | DELAYED_RELEASE_TABLET | Freq: Every day | ORAL | Status: DC
  Administered 2016-10-01 – 2016-10-02 (×2): 81 mg via ORAL
  Filled 2016-10-01 (×2): qty 1

## 2016-10-01 MED ORDER — CYCLOBENZAPRINE HCL 10 MG PO TABS
ORAL_TABLET | ORAL | Status: AC
  Filled 2016-10-01: qty 1

## 2016-10-01 MED ORDER — TAMSULOSIN HCL 0.4 MG PO CAPS
ORAL_CAPSULE | ORAL | Status: AC
  Administered 2016-10-01: 0.4 mg via ORAL
  Filled 2016-10-01: qty 1

## 2016-10-01 MED ORDER — DOCUSATE SODIUM 100 MG PO CAPS
100.0000 mg | ORAL_CAPSULE | Freq: Two times a day (BID) | ORAL | Status: DC
  Administered 2016-10-01 – 2016-10-02 (×2): 100 mg via ORAL
  Filled 2016-10-01 (×2): qty 1

## 2016-10-01 MED ORDER — CYCLOBENZAPRINE HCL 10 MG PO TABS
5.0000 mg | ORAL_TABLET | Freq: Once | ORAL | Status: AC
  Administered 2016-10-01: 5 mg via ORAL

## 2016-10-01 MED ORDER — SIMVASTATIN 10 MG PO TABS
10.0000 mg | ORAL_TABLET | Freq: Every day | ORAL | Status: DC
  Administered 2016-10-01: 10 mg via ORAL
  Filled 2016-10-01 (×3): qty 1

## 2016-10-01 MED ORDER — IBUPROFEN 600 MG PO TABS
ORAL_TABLET | ORAL | Status: AC
  Administered 2016-10-01: 600 mg
  Filled 2016-10-01: qty 1

## 2016-10-01 MED ORDER — ASPIRIN EC 81 MG PO TBEC
DELAYED_RELEASE_TABLET | ORAL | Status: AC
  Administered 2016-10-01: 81 mg via ORAL
  Filled 2016-10-01: qty 1

## 2016-10-01 MED ORDER — IBUPROFEN 600 MG PO TABS: 600.0000 mg | ORAL_TABLET | Freq: Once | ORAL | Status: DC

## 2016-10-01 MED ORDER — OLANZAPINE 10 MG PO TABS
20.0000 mg | ORAL_TABLET | Freq: Every day | ORAL | Status: DC
  Administered 2016-10-01: 20 mg via ORAL
  Filled 2016-10-01: qty 2

## 2016-10-01 NOTE — ED Notes (Signed)
Patient noted in room. No complaints, stable, in no acute distress. Q15 minute rounds and monitoring via Security Cameras to continue.  
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
## 2016-10-01 NOTE — [facility] Counselor (Signed)
Per Dr. Toni Amendlapacs, patient meets criteria for Geriatric placement.  Referral information has been faxed to:  [facility] [phone])  [facility] [phone] ex. [phone])  [facility] [phone])  [facility] [phone])  [facility] [phone])  [facility] [phone])  [facility] [phone])

## 2016-10-01 NOTE — ED Notes (Signed)
PT  IVC  PENDING  PLACEMENT/PT  SEEN BY  DR  CLAPACS 
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
## 2016-10-01 NOTE — ED Notes (Signed)
Pt dressed out in triage , brown colored shoes, blue shorts, light blue shirt, black colored socks, with colored underwear, brown colored wallet with a drivers licence, and $08.65$75.00 cash ( 3-$20's, 1-$10, and 1- $5, pt wishes the cash be put in the hospital safe). Key ring with 2 keys , a pocket watch clear/white face with a color of gold as the frame

## 2016-10-01 NOTE — ED Notes (Signed)
Dr. Clapacs at bedside at this time.  

## 2016-10-01 NOTE — ED Notes (Signed)
Pt. Transferred to [facility] from ED to room 4 after Report to include Situation, Background, Assessment and Recommendations from Sarah RN. Pt. Oriented to unit including Q15 minute rounds as well as the security cameras for their protection. Patient is alert and oriented, warm and dry in no acute distress. Patient denies SI, HI, and AVH. Pt. Encouraged to let me know if needs arise.

## 2016-10-01 NOTE — ED Notes (Signed)
Snack and beverage given. 

## 2016-10-01 NOTE — ED Notes (Signed)
Cash locked in hospital safe, KEY # [id number], placed in main Pyxis in "Key" slot, yellow envelope copy placed on pt chart

## 2016-10-01 NOTE — ED Notes (Signed)
Second mattress placed on bed.

## 2016-10-01 NOTE — ED Triage Notes (Signed)
Says he has been depressed for more than one month.  Says he does not want to hurt self or others, just feels bad.

## 2016-10-01 NOTE — ED Notes (Signed)
Pt given dinner tray and drink 
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
## 2016-10-02 LAB — URINALYSIS COMPLETE WITH MICROSCOPIC (ARMC ONLY)
BACTERIA UA: NONE SEEN
Bilirubin Urine: NEGATIVE
Glucose, UA: NEGATIVE mg/dL
HGB URINE DIPSTICK: NEGATIVE
Ketones, ur: NEGATIVE mg/dL
LEUKOCYTES UA: NEGATIVE
NITRITE: NEGATIVE
PROTEIN: NEGATIVE mg/dL
SPECIFIC GRAVITY, URINE: 1.002 — AB (ref 1.005–1.030)
Squamous Epithelial / LPF: NONE SEEN
pH: 6 (ref 5.0–8.0)

## 2016-10-02 LAB — TSH: TSH: 0.31 u[IU]/mL — AB (ref 0.350–4.500)

## 2016-10-02 NOTE — ED Notes (Signed)
Patient resting quietly in room. No noted distress or abnormal behaviors noted. Will continue 15 minute checks and observation by security camera for safety. 

## 2016-10-02 NOTE — ED Notes (Signed)
Patient noted in room. Stable, in no acute distress. Q15 minute rounds and monitoring via [employer] Cam[employer] to continue. Pt. Given a urine cup for specimen.

## 2016-10-02 NOTE — Progress Notes (Signed)
Inpatient Diabetes Program Recommendations  AACE/ADA: New Consensus Statement on Inpatient Glycemic Control (2015)  Target Ranges:  Prepandial:   less than 140 mg/dL      Peak postprandial:   less than 180 mg/dL (1-2 hours)      Critically ill patients:  140 - 180 mg/dL   Results for Kyle [name], Kyle [name] (MRN [id number]) as of 10/02/2016 08:32  Ref. Range 10/01/2016 13:43  Glucose Latest Ref Range: 65 - 99 mg/dL 308209 (H)   Review of Glycemic Control  Diabetes history: Hyperglycemia (per Dr. Toni Amendlapacs note; no actual dx of DM) Outpatient Diabetes medications: Metformin 500 mg BID Current orders for Inpatient glycemic control: None; in ED  Inpatient Diabetes Program Recommendations: Correction (SSI): Please use Glycemic Control order set to order CBGs with Novolog 0-9 units ACHS. A1C: Please consider ordering an A1C to evaluate glycemic control over the past 2-3 months.  Thanks, Orlando PennerMarie Iolanda Folson, RN, MSN, CDE Diabetes Coordinator Inpatient Diabetes Program [phone] (Team Pager from 8am to 5pm)

## 2016-10-02 NOTE — ED Notes (Signed)
Called to verify acceptance at 0850, Jasmine DecemberSharon, RN said the patient was accepted and making transport arrangements

## 2016-10-02 NOTE — ED Notes (Signed)
RN notified ER MD of note from diabetes coordinator regarding initiating the glycemic control order set.  Since pt is pending transfer MD will hold off on this order. Will re evaluate if [facility] not [facility].

## 2016-10-02 NOTE — ED Notes (Signed)
Patient noted sleeping in room. No complaints, stable, in no acute distress. Q15 minute rounds and monitoring via Security Cameras to continue.  

## 2016-10-02 NOTE — ED Notes (Signed)
Report to oncoming nurses. 

## 2016-10-02 NOTE — ED Notes (Signed)
Patient resting quietly in room. Pt remains calm and pleasant. Accepting of transfer. No noted distress or abnormal behaviors noted. Will continue 15 minute checks and observation by security camera for safety.

## 2016-10-02 NOTE — ED Notes (Signed)
Pt transferred [facility] by [employer]. All belongings returned to pt, pt signature obtained for valuables. Pt denies SI/HI and AVH.

## 2016-10-02 NOTE — BHH Counselor (Signed)
Patient has been accepted at [facility], [address], [place], [place] [id number].  Accepting Physician: Dr. Quintin AltoVeser. Call report #[phone] Patient can arrive anytime after 8 am.

## 2016-10-02 NOTE — ED Notes (Signed)
Called for transport by [employer] [phone]

## 2016-10-02 NOTE — ED Notes (Signed)
Patient noted in room. No complaints, stable, in no acute distress. Q15 minute rounds and monitoring via Security Cameras to continue.  

## 2016-10-02 NOTE — ED Notes (Signed)
Pt belongings returned, pt signature obtained.  Pt dressed for transfer. Belongings given to officer.  Pt denies SI/HI and AVH. Denies pain.   Maintained on 15 minute checks and observation by security camera for safety.

## 2016-10-02 NOTE — ED Notes (Signed)
Pt denies SI,HI and AVH.  Pt denies pain. Pt stated he is in the hospital because of depression. Pt still having trouble dealing with the loss of his wife (she passed a year ago). Pt accepting of pending [facility].   Maintained on 15 minute checks and observation by security camera for safety.

## 2016-12-30 ENCOUNTER — Inpatient Hospital Stay
Admit: 2016-12-30 | Discharge: 2017-01-07 | Disposition: A | Discharge status: Home / Self Care | Payer: MEDICARE | Source: Other Acute Inpatient Hospital | Attending: Psychiatry | Admitting: Psychiatry

## 2016-12-30 DIAGNOSIS — F259 Schizoaffective disorder, unspecified: Secondary | ICD-10-CM

## 2016-12-30 MED ORDER — IBUPROFEN 400 MG TAB
400 mg | Freq: Three times a day (TID) | ORAL | Status: DC | PRN
Start: 2016-12-30 — End: 2017-01-07
  Administered 2017-01-03 (×2): via ORAL

## 2016-12-30 MED ORDER — BENZTROPINE 1 MG/ML IJ SOLN
1 mg/mL | Freq: Two times a day (BID) | INTRAMUSCULAR | Status: DC | PRN
Start: 2016-12-30 — End: 2017-01-07

## 2016-12-30 MED ORDER — ACETAMINOPHEN 325 MG TABLET
325 mg | ORAL | Status: DC | PRN
Start: 2016-12-30 — End: 2017-01-07
  Administered 2017-01-02: 20:00:00 via ORAL

## 2016-12-30 MED ORDER — MAGNESIUM HYDROXIDE 400 MG/5 ML ORAL SUSP
400 mg/5 mL | Freq: Every day | ORAL | Status: DC | PRN
Start: 2016-12-30 — End: 2017-01-07

## 2016-12-30 MED ORDER — NICOTINE 21 MG/24 HR DAILY PATCH
21 mg/24 hr | Freq: Every day | TRANSDERMAL | Status: DC | PRN
Start: 2016-12-30 — End: 2017-01-07

## 2016-12-30 MED ORDER — OLANZAPINE 2.5 MG TAB
2.5 mg | Freq: Four times a day (QID) | ORAL | Status: DC | PRN
Start: 2016-12-30 — End: 2017-01-07
  Administered 2016-12-31 – 2017-01-04 (×4): via ORAL

## 2016-12-30 MED ORDER — BENZTROPINE 1 MG TAB
1 mg | Freq: Two times a day (BID) | ORAL | Status: DC | PRN
Start: 2016-12-30 — End: 2017-01-07

## 2016-12-30 MED ORDER — ZIPRASIDONE MESYLATE 20 MG IM SOLR
20. mg/mL (final conc.) | Freq: Two times a day (BID) | INTRAMUSCULAR | Status: DC | PRN
Start: 2016-12-30 — End: 2017-01-07

## 2016-12-30 MED ORDER — ZOLPIDEM 5 MG TAB
5 mg | Freq: Every evening | ORAL | Status: DC | PRN
Start: 2016-12-30 — End: 2017-01-07
  Administered 2016-12-31 – 2017-01-06 (×3): via ORAL

## 2016-12-30 NOTE — Progress Notes (Signed)
Problem: Dementia-BEHAVIORAL HEALTH (Adult/Pediatric)  Goal: *STG: Participates in treatment plan  [facility]  Master Treatment Plan for Corey LennoxMarvin Crawford    Date Treatment Plan Initiated: 12/30/2016    Treatment Plan Modalities:  Type of Modality Amount  (x minutes) Frequency (x/week) Duration (x days) Name of Responsible Staff   Community & wrap-up meetings to encourage peer interactions 20 5 5    A. Nedra HaiLee, BHT   Group psychotherapy to assist in building coping skills and internal controls       Therapeutic activity groups to build coping skills 60 5 5 Johnnette LitterBeverly Baker, [facility]   Psychoeducation in group setting to address:   Medication education   60 1 5 [place], [place]   Coping skills   20 3 5  Melford AaseBenita Chalmers, RN   Relaxation techniques         Symptom management         Discharge planning         Spirituality          NAMI         Recovery/AA/NA         Physician medication management         Family meeting/discharge planning

## 2016-12-30 NOTE — Behavioral Health Treatment Team (Cosign Needed)
GROUP THERAPY PROGRESS NOTE    The patient Ellsworth LennoxMarvin Hollerbach a 71 y.o. male is participating in Creative Expression Group.     Group time: 1 hour    Personal goal for participation: To concentrate on selected task    Goal orientation: social    Group therapy participation: active    Therapeutic interventions reviewed and discussed: Crafts, games, music    Impression of participation: The patient was attentive.    Audelia HivesBeverly S Baker  12/30/2016  5:16 PM

## 2016-12-30 NOTE — Progress Notes (Addendum)
Problem: Falls - Risk of  Goal: *Absence of Falls  Document Schmid Fall Risk and appropriate interventions in the flowsheet.   Outcome: Progressing Towards Goal  Fall Risk Interventions:            Medication Interventions: Teach patient to arise slowly                  Pt observed resting comfortably in bed with eyes closed, breathing even and unlabored. No s/s of distress noted, no complaints voiced. Pt slept 7 hours, will continue to monitor for safety, Q15. Labs collected.

## 2016-12-30 NOTE — H&P (Signed)
History and Physical    Subjective:     Corey Crawford is a 71 y.o. with past medical hx significant for HTN, CVA, and Depression.    [facility] with principal diagnosis of schizoaffective disorder. Patient is under the impression is on the psych ward  because he was complaining of a headache, and has inability to eat/sleep. Patient denied being depressed and having SI/HI. Patient is  pleasant and cooperative. Patient is making jokes.     Pt denies any acute medical issues. Pt is not showing any signs of distress.    Past Medical History:   Diagnosis Date   ??? Depression    ??? Stroke [facility](HCC)     >HTN    PSHx: appendectomy, tonsillectomy    Family History   Problem Relation Age of Onset   ??? Bipolar Disorder Father      committed suicide      Social History   Substance Use Topics   ??? Smoking status: Never Smoker   ??? Smokeless tobacco: Never Used   ??? Alcohol use No       Prior to Admission medications    Medication Sig Start Date End Date Taking? Authorizing Provider   amLODIPine (NORVASC) 10 mg tablet Take 1 Tab by mouth daily. Indications: HYPERTENSION 05/31/15   Mackie PaiBruce R Stevens, MD   atenolol (TENORMIN) 25 mg tablet Take 1 Tab by mouth daily. Indications: HYPERTENSION 05/31/15   Mackie PaiBruce R Stevens, MD   lithium carbonate 300 mg capsule Take 3 Caps by mouth nightly. Indications: BIPOLAR DISORDER 05/31/15   Mackie PaiBruce R Stevens, MD   divalproex ER (DEPAKOTE ER) 500 mg ER tablet Take 4 Tabs by mouth nightly. Indications: scizoaffective dis 05/31/15   Mackie PaiBruce R Stevens, MD   QUEtiapine SR (SEROQUEL XR) 300 mg sr tablet Take 2 Tabs by mouth every evening. Indications: BIPOLAR DISORDER, SCHIZOPHRENIA 05/31/15   Mackie PaiBruce R Stevens, MD     No Known Allergies     Review of Systems:  Constitutional: negative  Eyes: negative  Ears, Nose, Mouth, Throat, and Face: negative  Respiratory: negative  Cardiovascular: negative  Gastrointestinal: negative  Genitourinary:negative  Integument/Breast: negative  Hematologic/Lymphatic: negative   Musculoskeletal:negative  Neurological: negative  Behavioral/Psychiatric: schizoaffective disorder  Endocrine: negative  Allergic/Immunologic: negative     Objective:     Intake and Output:            Physical Exam:   Visit Vitals   ??? BP 146/88   ??? Pulse (!) 132   ??? Temp 98.8 ??F (37.1 ??C)   ??? Resp 18   ??? Ht 5\' 8"  (1.727 m)   ??? Wt 94.5 kg (208 lb 4.8 oz)   ??? BMI 31.67 kg/m2     General:  Alert, cooperative, no distress, appears stated age.   Head:  Normocephalic, without obvious abnormality, atraumatic.   Eyes:  Conjunctivae/corneas clear. PERRL, EOMs intact.   Ears:  Normal external ear canals both ears.   Nose: Nares normal. Septum midline. Mucosa normal. No drainage or sinus tenderness.   Throat: Lips, mucosa, and tongue normal. Teeth and gums normal.   Neck: Supple, symmetrical, trachea midline, no adenopathy, thyroid: no enlargement/tenderness/nodules, no carotid bruit and no JVD.   Back:   Symmetric, no curvature. ROM normal. No CVA tenderness.   Lungs:   Clear to auscultation bilaterally.   Chest wall:  No tenderness or deformity.   Heart:  Regular rate and rhythm, S1, S2 normal, no murmur, click, rub or gallop.  Abdomen:   Soft, non-tender. Bowel sounds normal. No masses,  No organomegaly.   Extremities: Extremities normal, atraumatic, no cyanosis or edema.   Pulses: 2+ and symmetric all extremities.   Skin: Skin color, texture, turgor normal. No rashes or lesions   Lymph nodes: Cervical, supraclavicular, and axillary nodes normal.   Neurologic: CNII-XII intact. Normal strength, sensation and reflexes throughout.       Data Review:   No results found for this or any previous visit (from the past 24 hour(s)).      Assessment:     Active Problems:    Schizoaffective disorder (HCC) (12/30/2016)      Depression (12/30/2016)    HTN    Hx CVA    Plan:     Start Aspirin    Restart antihypertensives.  No VTE prophylaxis indicated or necessary at this time.   Signed By: Chelsea Primus, MD     December 30, 2016

## 2016-12-30 NOTE — Progress Notes (Signed)
Problem: Dementia-BEHAVIORAL HEALTH (Adult/Pediatric)  Goal: *STG: Remains safe in hospital  Pt is alert and oriented. Denies SI/HI and contracts for safety. Pt denies psychotic symptoms. Pt is able to reality test. Pt is compliant with medication and meals. Pt attends groups and activities. Pt is self care with ADLS. Continue to assess mood an behavior. Assist to reality test. Monitor on Q 15 checks.

## 2016-12-30 NOTE — Behavioral Health Treatment Team (Signed)
Patient was admitted to unit per wheelchair. Patient stated he was here because he was complaining of a headache, and the inability to eat/sleep. Patient denied being depressed and having SI/HI. Patient was pleasant and cooperative. Patient was making jokes. Lunch was obtained for patient and he ate 100%. A body search was performed by two staff. No tattoos noted. No wounds, cuts, or abrasions noted. Patient was oriented to unit. Dr. Julieta GuttingKarn notified of patient's admission and orders were obtained.

## 2016-12-31 ENCOUNTER — Inpatient Hospital Stay: Admit: 2016-12-31 | Payer: MEDICARE | Attending: Psychiatry | Primary: Family Medicine

## 2016-12-31 LAB — GLUCOSE, POC: Glucose (POC): 82 mg/dL (ref 65–100)

## 2016-12-31 LAB — LIPID PANEL
CHOL/HDL Ratio: 3.7 (ref 0–5.0)
Cholesterol, total: 176 MG/DL (ref <200)
HDL Cholesterol: 47 MG/DL
LDL, calculated: 115.2 MG/DL — ABNORMAL HIGH (ref 0–100)
Triglyceride: 69 MG/DL (ref <150)
VLDL, calculated: 13.8 MG/DL

## 2016-12-31 LAB — TSH 3RD GENERATION: TSH: 0.72 u[IU]/mL (ref 0.36–3.74)

## 2016-12-31 LAB — GLUCOSE, FASTING: Glucose: 118 MG/DL — ABNORMAL HIGH (ref 65–100)

## 2016-12-31 MED ORDER — AMLODIPINE 5 MG TAB
5 mg | Freq: Every day | ORAL | Status: DC
Start: 2016-12-31 — End: 2017-01-07
  Administered 2016-12-31 – 2017-01-07 (×7): via ORAL

## 2016-12-31 MED ORDER — HYDROXYZINE PAMOATE 25 MG CAP
25 mg | Freq: Three times a day (TID) | ORAL | Status: DC | PRN
Start: 2016-12-31 — End: 2017-01-07
  Administered 2016-12-31 – 2017-01-07 (×5): via ORAL

## 2016-12-31 MED ORDER — QUETIAPINE 25 MG TAB
25 mg | Freq: Every evening | ORAL | Status: DC
Start: 2016-12-31 — End: 2017-01-01
  Administered 2017-01-01: 02:00:00 via ORAL

## 2016-12-31 MED ORDER — SERTRALINE 50 MG TAB
50 mg | Freq: Every day | ORAL | Status: DC
Start: 2016-12-31 — End: 2017-01-04
  Administered 2016-12-31 – 2017-01-04 (×5): via ORAL

## 2016-12-31 MED ORDER — ASPIRIN 81 MG CHEWABLE TAB
81 mg | Freq: Every day | ORAL | Status: DC
Start: 2016-12-31 — End: 2017-01-07
  Administered 2016-12-31 – 2017-01-07 (×8): via ORAL

## 2016-12-31 MED ORDER — ATENOLOL 25 MG TAB
25 mg | Freq: Every day | ORAL | Status: DC
Start: 2016-12-31 — End: 2017-01-07
  Administered 2016-12-31 – 2017-01-07 (×7): via ORAL

## 2016-12-31 MED FILL — CHILDREN'S ASPIRIN 81 MG CHEWABLE TABLET: 81 mg | ORAL | Qty: 1

## 2016-12-31 MED FILL — SERTRALINE 50 MG TAB: 50 mg | ORAL | Qty: 1

## 2016-12-31 MED FILL — ZYPREXA 2.5 MG TABLET: 2.5 mg | ORAL | Qty: 1

## 2016-12-31 MED FILL — HYDROXYZINE PAMOATE 25 MG CAP: 25 mg | ORAL | Qty: 1

## 2016-12-31 MED FILL — ZOLPIDEM 5 MG TAB: 5 mg | ORAL | Qty: 1

## 2016-12-31 MED FILL — ATENOLOL 25 MG TAB: 25 mg | ORAL | Qty: 1

## 2016-12-31 MED FILL — AMLODIPINE 5 MG TAB: 5 mg | ORAL | Qty: 2

## 2016-12-31 NOTE — Consults (Signed)
Consult was called for Pt c/o SOB in setting of unremarkable vitals, 100% sats on RA and negative CE yesterday. CXR done today is negative. Records reviewed and findings discussed with nurse. No medical intervention needed at this point. Please call if have any questions.

## 2016-12-31 NOTE — Progress Notes (Signed)
Problem: Dementia-BEHAVIORAL HEALTH (Adult/Pediatric)  Goal: *STG: Remains safe in hospital  Pt is alert and oriented. Pt is endorsing passive SI. Pt contracts for safety. Pt is isolating to room. Pt is extremely anxious. Pt is compliant with medication but is eating only small amounts. Pt is not attending groups. Pt is self care with ADLS. Continue to assess mood an behavior. Assist to reality test. Monitor on Q 15 checks.

## 2016-12-31 NOTE — Consults (Signed)
Consult was called for Pt c/o SOB in setting of unremarkable vitals, 100% sats on RA and negative CE yesterday. CXR done today is negative. Records reviewed and findings discussed with nurse. No medical intervention needed at this point. Please call if have any questions.

## 2016-12-31 NOTE — Behavioral Health Treatment Team (Addendum)
PSYCHOSOCIAL ASSESSMENT  :Patient identifying info:  Corey Crawford is a 71 y.o., male admitted 12/30/2016 12:23 PM     Presenting problem and precipitating factors: Pt stated he was suicidal and would rather die that go on living in the pain he is in.  Pt states that he is lonely.  Per sister's report, patient asked [facility] [place] staff for a shot that would end his life.  Sister reports patient has been off medication since November 2017.  [facility] [place] from November 10th to December 10th.   And pt has remained inside of his house since December 10th refusing to leave to get groceries, medication etc.  Pt states he does not need the medication.  Pt's sister reports that patient owns his trailer but cannot care for himself and she does not want to continue enabling him.  Sister would like for patient to agree to a nursing facility.     Mental status assessment:  Pt is alert and oriented.  Pt denies SI/HI.  Pt's mood is depressed, affect is depressed. Pt's thought process is logical.  Pt's insight and judgment are fair, reliability is fair.      Current psychiatric providers and contact info: Pt used to receive services through [facility], [place]y [facility], [place], [place] [facility]    Previous psychiatric services/providers and response to treatment: Patient [place] a [facility] [place] [facility] over the past 14 years.     Family history of mental illness: Patient's father committed suicide by gun (1973) and grandfather committed suicide by gun.     Substance abuse history:    Social History   Substance Use Topics   ??? Smoking status: Never Smoker   ??? Smokeless tobacco: Never Used   ??? Alcohol use No       Family constellation:  Pt has a close relationship with his sister, Corey Crawford  [phone].  Pt has 2 brothers and 2 daughters.     Is significant other involved? N/A       Describe support system:  Pt's greatest form of support comes from his sister.     Describe living arrangements and home environment: [place]   Health issues:   Crawford Problems  Never Reviewed          Codes Class Noted POA    Schizoaffective disorder (HCC) ICD-10-CM: F25.9  ICD-9-CM: 295.70  12/30/2016 Unknown        Depression ICD-10-CM: F32.9  ICD-9-CM: 311  12/30/2016 Unknown              Trauma history: None reported.     Legal issues: None reported.     History of military service: None reported.     Financial status: SSDI    Religious/cultural factors: None expressed at this time.     Education/work history: Patient graduated [employer].     Have you been licensed as a Designer, jewelleryheath care professional (current or expired): No.  Leisure and recreation preferences: Unknown.  Describe coping skills: Limited.     Corey Crawford  12/31/2016

## 2016-12-31 NOTE — Progress Notes (Signed)
Initial Neurology Evaluation - Preliminary Note    Patient seen, chart reviewed and neurology consultation report dictated.      Impression:  Frequent fall  Gait disorder  Tremors  Rule out parkinson's disease  Dizziness  Dysautonomia.  Rule out CVA  Plan:  MRI Brain  EEG  ASA 325 mg p.o.qd  Blood for homocysteine, RF,ANA,TSH, CRP,Vit D,B12 CEA    Will follow patient's course along with you as necessary. Thank you for the opportunity to participate in the care of your patient.    Kaileia Flow C Jahnyla Parrillo, MD  01/01/2017

## 2016-12-31 NOTE — Progress Notes (Signed)
GROUP THERAPY PROGRESS NOTE      Corey Crawford was not present for medication group.      Elita Booneose I Salzberg, PHARMD, BCPS

## 2016-12-31 NOTE — Behavioral Health Treatment Team (Cosign Needed)
Pt did not attend coping skills group.

## 2016-12-31 NOTE — Behavioral Health Treatment Team (Signed)
CXR was performed. Neurology consult was called in. Vistaril was given to patient for anxiety.

## 2016-12-31 NOTE — Progress Notes (Addendum)
Laboratory monitoring for mood stabilizer and antipsychotics:    Recommended baseline monitoring has been completed based on this patient's current medication regimen.    The following labs were completed at transferring facility: WBC 11.07, Hgb 13.7, Hct 39.6, Plts 285, sodium 139, potassium 4.6, BUN 9, creatinine 0.7, glucose 112, calcium 9.6, , UDS (-), BAL 0, UA completed, QTc 432 ms     The patient is currently taking the following medication(s):   Current Facility-Administered Medications   Medication Dose Route Frequency   ??? QUEtiapine (SEROquel) tablet 25 mg  25 mg Oral QHS   ??? aspirin chewable tablet 81 mg  81 mg Oral DAILY   ??? amLODIPine (NORVASC) tablet 10 mg  10 mg Oral DAILY   ??? atenolol (TENORMIN) tablet 25 mg  25 mg Oral DAILY   ??? sertraline (ZOLOFT) tablet 25 mg  25 mg Oral DAILY       Height, Weight, BMI Estimation  Estimated body mass index is 31.67 kg/(m^2) as calculated from the following:    Height as of this encounter: 172.7 cm (68").    Weight as of this encounter: 94.5 kg (208 lb 4.8 oz).     Renal Function, Hepatic Function and Chemistry  CrCl cannot be calculated (Patient's most recent sCr result is older than the maximum 180 days allowed.).    Lab Results   Component Value Date/Time    Sodium 141 05/05/2015 06:00 AM    Potassium 3.5 05/05/2015 06:00 AM    Chloride 106 05/05/2015 06:00 AM    CO2 27 05/05/2015 06:00 AM    Anion gap 8 05/05/2015 06:00 AM    Glucose 118 (H) 12/31/2016 04:55 AM    BUN 8 05/05/2015 06:00 AM    Creatinine 0.70 05/05/2015 06:00 AM    BUN/Creatinine ratio 11 (L) 05/05/2015 06:00 AM    GFR est AA >60 05/05/2015 06:00 AM    GFR est non-AA >60 05/05/2015 06:00 AM    Calcium 8.3 (L) 05/05/2015 06:00 AM    ALT (SGPT) 22 01/01/2017 05:23 AM    AST (SGOT) 14 (L) 01/01/2017 05:23 AM    Alk. phosphatase 66 01/01/2017 05:23 AM    Protein, total 6.5 01/01/2017 05:23 AM    Albumin 3.2 (L) 01/01/2017 05:23 AM    Globulin 3.3 01/01/2017 05:23 AM     A-G Ratio 1.0 (L) 01/01/2017 05:23 AM    Bilirubin, total 0.5 01/01/2017 05:23 AM       Lab Results   Component Value Date/Time    Glucose 118 (H) 12/31/2016 04:55 AM    Glucose (POC) 82 12/31/2016 12:11 PM       Lipids  Lab Results   Component Value Date/Time    Cholesterol, total 176 12/31/2016 04:55 AM    HDL Cholesterol 47 12/31/2016 04:55 AM    LDL, calculated 115.2 (H) 12/31/2016 04:55 AM    Triglyceride 69 12/31/2016 04:55 AM    CHOL/HDL Ratio 3.7 12/31/2016 04:55 AM       Thyroid Function    Lab Results   Component Value Date/Time    TSH 0.72 12/31/2016 04:55 AM     Vitals  Visit Vitals   ??? BP 127/63   ??? Pulse (!) 57   ??? Temp 96.3 ??F (35.7 ??C)   ??? Resp 16   ??? Ht 172.7 cm (68")   ??? Wt 94.5 kg (208 lb 4.8 oz)   ??? SpO2 100%   ??? BMI 31.67 kg/m2     Louis Matte,  PharmD, BCPS  [phone] (pharmacy)

## 2016-12-31 NOTE — Behavioral Health Treatment Team (Signed)
Patient complained of SOB. Vital signs were obtained along with pulse ox, which was 100% on room air. Dr. Lajean SaverFawaz was notified. Ekg and CXR were ordered. EKG was performed.

## 2016-12-31 NOTE — Behavioral Health Treatment Team (Signed)
Patient stated he was not able to take care of himself and wanted to know where he would go after discharge. Patient's affect is anxious. Patient stated that he couldn't walk yesterday. Writer reminded patient that he was doing quite a bit of walking yesterday and today without assistance. Patient was encouraged to eat lunch. He ate about 30% of his meal.

## 2016-12-31 NOTE — Progress Notes (Signed)
This rn and rvt Spivey to third floor for Ekg on this pt  Reason SOB. Explanation to the patient for test, pt agreeable, staff  present with this rn. Ekg completed placed on chart and ekg transmitted for reading. Rn states his sat on room air 99 %. Other tests ordered.  Benita Staff Rn in room with patient.

## 2016-12-31 NOTE — H&P (Signed)
INITIAL PSYCHIATRIC EVALUATION            IDENTIFICATION:    Patient Name  Corey Crawford   Date of Birth May 26, 1946   CSN [id number]   Medical Record Number  [id number]      Age  71 y.o.   PCP Phys Other, MD   Admit date:  12/30/2016    Room Number  319/01  @ [facility]   Date of Service  12/31/2016            HISTORY         REASON FOR HOSPITALIZATION:  CC: ". Pt admitted under a voluntary basis for severe depression with suicidal ideations  proving to be an imminent danger to self and an inability to care for self.    HISTORY OF PRESENT ILLNESS:    The patient, Corey Crawford, is a 71 y.o.  WHITE OR CAUCASIAN male with a past psychiatric history significant for depression and self harm thoughts , who presents at this time with complaints of (and/or evidence of) the following emotional symptoms: depression, suicidal thoughts/threats and anxiety.  Additional symptomatology include anxiety, difficulty sleeping, feeling depressed, feeling suicidal and poor concentration.  The above symptoms have been present for months . These symptoms are of moderate severity. These symptoms are constant  in nature.  The patient's condition has been precipitated by death of his wife and psychosocial stressors (lives alone  ).  He denied drinkng or using any drugs and he has no guns at home    ALLERGIES: No Known Allergies   MEDICATIONS PRIOR TO ADMISSION:   Prescriptions Prior to Admission   Medication Sig   ??? amLODIPine (NORVASC) 10 mg tablet Take 1 Tab by mouth daily. Indications: HYPERTENSION   ??? atenolol (TENORMIN) 25 mg tablet Take 1 Tab by mouth daily. Indications: HYPERTENSION   ??? lithium carbonate 300 mg capsule Take 3 Caps by mouth nightly. Indications: BIPOLAR DISORDER   ??? divalproex ER (DEPAKOTE ER) 500 mg ER tablet Take 4 Tabs by mouth nightly. Indications: scizoaffective dis   ??? QUEtiapine SR (SEROQUEL XR) 300 mg sr tablet Take 2 Tabs by mouth every evening. Indications: BIPOLAR DISORDER, SCHIZOPHRENIA       PAST MEDICAL HISTORY:   Past Medical History:   Diagnosis Date   ??? Depression    ??? Stroke [facility])    History reviewed. No pertinent surgical history.   SOCIAL HISTORY: widow    Social History     Social History   ??? Marital status: LEGALLY SEPARATED     Spouse name: N/A   ??? Number of children: N/A   ??? Years of education: N/A     Occupational History   ??? Not on file.     Social History Main Topics   ??? Smoking status: Never Smoker   ??? Smokeless tobacco: Never Used   ??? Alcohol use No   ??? Drug use: No   ??? Sexual activity: No     Other Topics Concern   ??? Not on file     Social History Narrative    Lives in [place]. Lives by self. On SSDI. No job.      FAMILY HISTORY: History reviewed. No pertinent family history.   Family History   Problem Relation Age of Onset   ??? Bipolar Disorder Father      committed suicide       REVIEW OF SYSTEMS:   History obtained from the patient  Pertinent items are noted in  the History of Present Illness.  All other Systems reviewed and are considered negative.           MENTAL STATUS EXAM & VITALS     MENTAL STATUS EXAM (MSE):    MSE FINDINGS ARE WITHIN NORMAL LIMITS (WNL) UNLESS OTHERWISE STATED BELOW. ( ALL OF THE BELOW CATEGORIES OF THE MSE HAVE BEEN REVIEWED (reviewed 12/31/2016) AND UPDATED AS DEEMED APPROPRIATE )  General Presentation age appropriate, cooperative   Orientation oriented to time, place and person   Vital Signs  See below (reviewed 12/31/2016); Vital Signs (BP, Pulse, & Temp) are within normal limits if not listed below.   Gait and Station Stable/steady, no ataxia   Musculoskeletal System No extrapyramidal symptoms (EPS); no abnormal muscular movements or Tardive Dyskinesia (TD); muscle strength and tone are within normal limits   Language No aphasia or dysarthria   Speech:  normal pitch and normal volume   Thought Processes logical; normal rate of thoughts; fair abstract reasoning/computation   Thought Associations goal directed   Thought Content free of delusions    Suicidal Ideations no plan    Homicidal Ideations no plan    Mood:  depressed   Affect:  depressed   Memory recent  fair   Memory remote:  fair   Concentration/Attention:  fair   Fund of Knowledge average   Insight:  good   Reliability fair   Judgment:  good          VITALS:     Patient Vitals for the past 24 hrs:   Temp Pulse Resp BP   12/31/16 0615 98.2 ??F (36.8 ??C) (!) 58 16 118/50   12/30/16 1630 98.8 ??F (37.1 ??C) (!) 132 18 146/88   12/30/16 1509 98.9 ??F (37.2 ??C) 81 18 142/68     Wt Readings from Last 3 Encounters:   12/30/16 94.5 kg (208 lb 4.8 oz)   05/13/15 95.3 kg (210 lb)     Temp Readings from Last 3 Encounters:   12/31/16 98.2 ??F (36.8 ??C)   05/31/15 97.3 ??F (36.3 ??C)     BP Readings from Last 3 Encounters:   12/31/16 118/50   05/31/15 140/73     Pulse Readings from Last 3 Encounters:   12/31/16 (!) 58   05/31/15 66            DATA     LABORATORY DATA:  Labs Reviewed   LIPID PANEL - Abnormal; Notable for the following:        Result Value    LDL, calculated 115.2 (*)     All other components within normal limits   GLUCOSE, FASTING - Abnormal; Notable for the following:     Glucose 118 (*)     All other components within normal limits   TSH 3RD GENERATION     Admission on 12/30/2016   Component Date Value Ref Range Status   ??? TSH 12/31/2016 0.72  0.36 - 3.74 uIU/mL Final   ??? LIPID PROFILE 12/31/2016        Final   ??? Cholesterol, total 12/31/2016 176  <200 MG/DL Final   ??? Triglyceride 12/31/2016 69  <150 MG/DL Final   ??? HDL Cholesterol 12/31/2016 47  MG/DL Final   ??? LDL, calculated 12/31/2016 115.2* 0 - 100 MG/DL Final   ??? VLDL, calculated 12/31/2016 13.8  MG/DL Final   ??? CHOL/HDL Ratio 12/31/2016 3.7  0 - 5.0   Final   ??? Glucose 12/31/2016 118* 65 - 100 MG/DL  Final        RADIOLOGY REPORTS:  No results found for this or any previous visit.No results found.           MEDICATIONS       ALL MEDICATIONS  Current Facility-Administered Medications   Medication Dose Route Frequency    ??? aspirin chewable tablet 81 mg  81 mg Oral DAILY   ??? amLODIPine (NORVASC) tablet 10 mg  10 mg Oral DAILY   ??? atenolol (TENORMIN) tablet 25 mg  25 mg Oral DAILY   ??? OLANZapine (ZyPREXA) tablet 2.5 mg  2.5 mg Oral Q6H PRN   ??? ziprasidone (GEODON) 10 mg in sterile water (preservative free) 0.5 mL injection  10 mg IntraMUSCular BID PRN   ??? benztropine (COGENTIN) tablet 1 mg  1 mg Oral BID PRN   ??? benztropine (COGENTIN) injection 1 mg  1 mg IntraMUSCular BID PRN   ??? zolpidem (AMBIEN) tablet 5 mg  5 mg Oral QHS PRN   ??? acetaminophen (TYLENOL) tablet 650 mg  650 mg Oral Q4H PRN   ??? ibuprofen (MOTRIN) tablet 400 mg  400 mg Oral Q8H PRN   ??? magnesium hydroxide (MILK OF MAGNESIA) 400 mg/5 mL oral suspension 30 mL  30 mL Oral DAILY PRN   ??? nicotine (NICODERM CQ) 21 mg/24 hr patch 1 Patch  1 Patch TransDERmal DAILY PRN      SCHEDULED MEDICATIONS  Current Facility-Administered Medications   Medication Dose Route Frequency   ??? aspirin chewable tablet 81 mg  81 mg Oral DAILY   ??? amLODIPine (NORVASC) tablet 10 mg  10 mg Oral DAILY   ??? atenolol (TENORMIN) tablet 25 mg  25 mg Oral DAILY                ASSESSMENT & PLAN        The patient, Corey Crawford, is a 71 y.o.  male who presents at this time for treatment of the following diagnoses:  Patient Active Hospital Problem List:   Schizoaffective disorder (HCC) (12/30/2016)    Assessment: depression and insomnia and not happy     Plan: zoloft 25 mg po daily and Seroquel 12.5 mg po hs     Depression (12/30/2016)    Assessment: depression     Plan: Zoloft 25 po daily           I will continue to monitor blood levels (Depakote, Tegretol, lithium, clozapine---a drug with a narrow therapeutic index= NTI) and associated labs for drug therapy implemented that require intense monitoring for toxicity as deemed appropriate based on current medication side effects and pharmacodynamically determined drug 1/2 lives.         A coordinated, multidisplinary treatment team (includes the nurse, unit  pharmcist, Administrator) round was conducted for this initial evaluation with the patient present.     The following regarding medications was addressed during rounds with patient:   the risks and benefits of the proposed medication. The patient was given the opportunity to ask questions. Informed consent given to the use of the above medications.     I will continue to adjust psychiatric and non-psychiatric medications (see above "medication" section and orders section for details) as deemed appropriate & based upon diagnoses and response to treatment.     I have reviewed admission (and previous/old) labs and medical tests in the EHR and or transferring hospital documents. I will continue to order blood tests/labs and diagnostic tests as deemed appropriate and review results as  they become available (see orders for details).    I have reviewed old psychiatric and medical records available in the EHR. I Will order additional psychiatric records from other institutions to further elucidate the nature of patient's psychopathology and review once available.    I will gather additional collateral information from friends, family and o/p treatment team to further elucidate the nature of patient's psychopathology and baselline level of psychiatric functioning.      ESTIMATED LENGTH OF STAY:    3       STRENGTHS:  Exercising self-direction/Resourceful, Access to housing/residential stability and Interpersonal/supportive relationships (family, friends, peers)                                        SIGNED:    [place], MD  12/31/2016

## 2017-01-01 LAB — HEPATIC FUNCTION PANEL
A-G Ratio: 1 — ABNORMAL LOW (ref 1.1–2.2)
ALT (SGPT): 22 U/L (ref 12–78)
AST (SGOT): 14 U/L — ABNORMAL LOW (ref 15–37)
Albumin: 3.2 g/dL — ABNORMAL LOW (ref 3.5–5.0)
Alk. phosphatase: 66 U/L (ref 45–117)
Bilirubin, direct: 0.1 MG/DL (ref 0.0–0.2)
Bilirubin, total: 0.5 MG/DL (ref 0.2–1.0)
Globulin: 3.3 g/dL (ref 2.0–4.0)
Protein, total: 6.5 g/dL (ref 6.4–8.2)

## 2017-01-01 LAB — SED RATE (ESR): Sed rate, automated: 107 mm/hr — ABNORMAL HIGH (ref 0–20)

## 2017-01-01 LAB — CK: CK: 42 U/L (ref 39–308)

## 2017-01-01 LAB — HOMOCYSTEINE, PLASMA: Homocysteine, plasma: 10.9 umol/L (ref 3.7–13.9)

## 2017-01-01 LAB — RHEUMATOID FACTOR, QT: Rheumatoid factor: <10 IU/mL (ref <15)

## 2017-01-01 MED ORDER — QUETIAPINE 25 MG TAB
25 mg | Freq: Every evening | ORAL | Status: DC
Start: 2017-01-01 — End: 2017-01-04
  Administered 2017-01-02 – 2017-01-04 (×3): via ORAL

## 2017-01-01 MED FILL — ATENOLOL 25 MG TAB: 25 mg | ORAL | Qty: 1

## 2017-01-01 MED FILL — CHILDREN'S ASPIRIN 81 MG CHEWABLE TABLET: 81 mg | ORAL | Qty: 1

## 2017-01-01 MED FILL — ZOLPIDEM 5 MG TAB: 5 mg | ORAL | Qty: 1

## 2017-01-01 MED FILL — SERTRALINE 50 MG TAB: 50 mg | ORAL | Qty: 1

## 2017-01-01 MED FILL — SEROQUEL 25 MG TABLET: 25 mg | ORAL | Qty: 1

## 2017-01-01 MED FILL — HYDROXYZINE PAMOATE 25 MG CAP: 25 mg | ORAL | Qty: 1

## 2017-01-01 MED FILL — AMLODIPINE 5 MG TAB: 5 mg | ORAL | Qty: 2

## 2017-01-01 NOTE — Progress Notes (Signed)
Problem: Dementia-BEHAVIORAL HEALTH (Adult/Pediatric)  Goal: *STG/LTG: Complies with medication therapy  Outcome: Progressing Towards Goal  Pt is alert/oriented x4. Calm and Cooperative. Pt isolated to room majority of shift. Pt appears a little paranoid. Mood is labile. Meds/meal compliant. No PRNs given this shift. No behavioral issues. Pt denies SI/HI and denies A/V hallucinations. Will continue to monitor pt with q15 min rounds for safety.

## 2017-01-01 NOTE — Behavioral Health Treatment Team (Signed)
Pt observed resting comfortably in bed with eyes closed, breathing even and unlabored. No s/s of distress noted, no complaints voiced. Pt slept 7 hours, will continue to monitor for safety, Q15.

## 2017-01-01 NOTE — Behavioral Health Treatment Team (Cosign Needed)
Pt did not attend coping skills group.

## 2017-01-01 NOTE — Behavioral Health Treatment Team (Signed)
PSYCHIATRIC PROGRESS NOTE         Patient Name  Corey Crawford   Date of Birth 10-Dec-1945   CSN [id number]   Medical Record Number  [id number]      Age  71 y.o.   PCP Phys Other, MD   Admit date:  12/30/2016    Room Number  371/69  @ [facility]   Date of Service  01/01/2017          PSYCHOTHERAPY SESSION NOTE:  Length of psychotherapy session: 15 minutes    Main condition/diagnosis/issues treated during session today, 01/01/2017 : depression     I employed Cognitive Behavioral therapy techniques, Reality-Oriented psychotherapy, as well as supportive psychotherapy in regards to various ongoing psychosocial stressors, including the following: pre-admission and current problems; housing issues; occupational issues; academic issues; legal issues; medical issues; and stress of hospitalization. Interpersonal relationship issues and psychodynamic conflicts explored.  Attempts made to alleviate maladaptive patterns. We, also, worked on issues of denial & effects of substance dependency/use     Overall, patient is not progressing    Treatment Plan Update (reviewed an updated 01/01/2017) :  I will modify psychotherapy tx plan by implementing more stress management strategies, building upon cognitive behavioral techniques, increasing coping skills, as well as shoring up psychological defenses).    An extended energy and skill set was needed to engage pt in psychotherapy due to some of the following: resistiveness, complexity, negativity, confrontational nature, hostile behaviors, and/or severe abnormalities in thought processes/psychosis resulting in the loss of expressive/receptive language communication skills.                            E & M PROGRESS NOTE:         HISTORY       CC: ". Pt admitted under a voluntary basis for severe depression with suicidal ideations  proving to be an imminent danger to self and an inability to care for self.    HISTORY OF PRESENT ILLNESS:     The patient, Corey Crawford, is a 71 y.o.  WHITE OR CAUCASIAN male with a past psychiatric history significant for depression and self harm thoughts , who presents at this time with complaints of (and/or evidence of) the following emotional symptoms: depression, suicidal thoughts/threats and anxiety.  Additional symptomatology include anxiety, difficulty sleeping, feeling depressed, feeling suicidal and poor concentration.  The above symptoms have been present for months . These symptoms are of moderate severity. These symptoms are constant  in nature.  The patient's condition has been precipitated by death of his wife and psychosocial stressors (lives alone  ).  He denied drinkng or using any drugs and he has no guns at home   01/01/17 he is feeling down and he is compliant with medications and no anger issues and no self harm behavior and no acting out behavior and no paranoid feeling         SIDE EFFECTS: (reviewed/updated 01/01/2017)  None reported or admitted to.  No noted toxicity with use of Depakote/Tegretol/lithium/Clozaril/TCAs   ALLERGIES:(reviewed/updated 01/01/2017)  No Known Allergies   MEDICATIONS PRIOR TO ADMISSION:(reviewed/updated 01/01/2017)  Prescriptions Prior to Admission   Medication Sig   ??? amLODIPine (NORVASC) 10 mg tablet Take 1 Tab by mouth daily. Indications: HYPERTENSION   ??? atenolol (TENORMIN) 25 mg tablet Take 1 Tab by mouth daily. Indications: HYPERTENSION   ??? lithium carbonate 300 mg capsule Take 3 Caps by mouth nightly. Indications:  BIPOLAR DISORDER   ??? divalproex ER (DEPAKOTE ER) 500 mg ER tablet Take 4 Tabs by mouth nightly. Indications: scizoaffective dis   ??? QUEtiapine SR (SEROQUEL XR) 300 mg sr tablet Take 2 Tabs by mouth every evening. Indications: BIPOLAR DISORDER, SCHIZOPHRENIA      PAST MEDICAL HISTORY: Past medical history from the initial psychiatric evaluation has been reviewed (reviewed/updated 01/01/2017) with no  additional updates (I asked patient and no additional past medical history provided). Past Medical History:   Diagnosis Date   ??? Depression    ??? Stroke [facility])    History reviewed. No pertinent surgical history.   SOCIAL HISTORY: Social history from the initial psychiatric evaluation has been reviewed (reviewed/updated 01/01/2017) with no additional updates (I asked patient and no additional social history provided). Social History     Social History   ??? Marital status: LEGALLY SEPARATED     Spouse name: N/A   ??? Number of children: N/A   ??? Years of education: N/A     Occupational History   ??? Not on file.     Social History Main Topics   ??? Smoking status: Never Smoker   ??? Smokeless tobacco: Never Used   ??? Alcohol use No   ??? Drug use: No   ??? Sexual activity: No     Other Topics Concern   ??? Not on file     Social History Narrative    Lives in [place]. Lives by self. On SSDI. No job.      FAMILY HISTORY: Family history from the initial psychiatric evaluation has been reviewed (reviewed/updated 01/01/2017) with no additional updates (I asked patient and no additional family history provided). Family History   Problem Relation Age of Onset   ??? Bipolar Disorder Father      committed suicide       REVIEW OF SYSTEMS: (reviewed/updated 01/01/2017)  Appetite:improved   Sleep: improved   All other Review of Systems: History obtained from the patient         Julian (MSE):    MSE FINDINGS ARE WITHIN NORMAL LIMITS (WNL) UNLESS OTHERWISE STATED BELOW. ( ALL OF THE BELOW CATEGORIES OF THE MSE HAVE BEEN REVIEWED (reviewed 01/01/2017) AND UPDATED AS DEEMED APPROPRIATE )  General Presentation age appropriate, cooperative   Orientation oriented to time, place and person   Vital Signs  See below (reviewed 01/01/2017); Vital Signs (BP, Pulse, & Temp) are within normal limits if not listed below.   Gait and Station Stable/steady, no ataxia   Musculoskeletal System No extrapyramidal symptoms (EPS); no abnormal  muscular movements or Tardive Dyskinesia (TD); muscle strength and tone are within normal limits   Language No aphasia or dysarthria   Speech:  normal pitch and normal volume   Thought Processes logical; normal rate of thoughts; fair abstract reasoning/computation   Thought Associations goal directed   Thought Content free of delusions   Suicidal Ideations no plan    Homicidal Ideations no plan    Mood:  depressed   Affect:  depressed   Memory recent  fair   Memory remote:  fair   Concentration/Attention:  good   Fund of Knowledge average   Insight:  good   Reliability fair   Judgment:  fair          VITALS:     Patient Vitals for the past 24 hrs:   Temp Pulse Resp BP SpO2   01/01/17 0639 97.7 ??  F (36.5 ??C) (!) 53 16 127/52 -   12/31/16 1637 97.7 ??F (36.5 ??C) (!) 55 16 119/59 -   12/31/16 1100 98 ??F (36.7 ??C) (!) 55 18 126/66 100 %     Wt Readings from Last 3 Encounters:   12/30/16 94.5 kg (208 lb 4.8 oz)   05/13/15 95.3 kg (210 lb)     Temp Readings from Last 3 Encounters:   01/01/17 97.7 ??F (36.5 ??C)   05/31/15 97.3 ??F (36.3 ??C)     BP Readings from Last 3 Encounters:   01/01/17 127/52   05/31/15 140/73     Pulse Readings from Last 3 Encounters:   01/01/17 (!) 53   05/31/15 66            DATA     LABORATORY DATA:(reviewed/updated 01/01/2017)  Recent Results (from the past 24 hour(s))   EKG, 12 LEAD, INITIAL    Collection Time: 12/31/16 11:46 AM   Result Value Ref Range    Ventricular Rate 51 BPM    Atrial Rate 51 BPM    P-R Interval 144 ms    QRS Duration 92 ms    Q-T Interval 450 ms    QTC Calculation (Bezet) 414 ms    Calculated P Axis 26 degrees    Calculated R Axis 10 degrees    Calculated T Axis 55 degrees    Diagnosis       Sinus bradycardia  Incomplete right bundle branch block  Borderline ECG  No previous ECGs available     GLUCOSE, POC    Collection Time: 12/31/16 12:11 PM   Result Value Ref Range    Glucose (POC) 82 65 - 100 mg/dL    Performed by Wynona Dove    HEPATIC FUNCTION PANEL     Collection Time: 01/01/17  5:23 AM   Result Value Ref Range    Protein, total 6.5 6.4 - 8.2 g/dL    Albumin 3.2 (L) 3.5 - 5.0 g/dL    Globulin 3.3 2.0 - 4.0 g/dL    A-G Ratio 1.0 (L) 1.1 - 2.2      Bilirubin, total 0.5 0.2 - 1.0 MG/DL    Bilirubin, direct 0.1 0.0 - 0.2 MG/DL    Alk. phosphatase 66 45 - 117 U/L    AST (SGOT) 14 (L) 15 - 37 U/L    ALT (SGPT) 22 12 - 78 U/L     Lab Results   Component Value Date/Time    Valproic acid 112 (H) 05/29/2015 05:12 AM     Lab Results   Component Value Date/Time    Lithium level 1.02 05/29/2015 05:12 AM      RADIOLOGY REPORTS:(reviewed/updated 01/01/2017)  Xr Chest Port    Result Date: 12/31/2016  EXAM:  XR CHEST PORT INDICATION:  Shortness of breath. COMPARISON:  No old study. FINDINGS:  An AP portable view was obtained of the chest.  An old left chest injury with metallic residuals, with left thoracotomy/rib deformities and with an old left pleural reaction are seen.  The right lung is clear.  The heart is within normal size.      IMPRESSION: Old left chest injury. Clear right lung          MEDICATIONS     ALL MEDICATIONS:   Current Facility-Administered Medications   Medication Dose Route Frequency   ??? QUEtiapine (SEROquel) tablet 25 mg  25 mg Oral QHS   ??? aspirin chewable tablet 81 mg  81 mg Oral DAILY   ???  amLODIPine (NORVASC) tablet 10 mg  10 mg Oral DAILY   ??? atenolol (TENORMIN) tablet 25 mg  25 mg Oral DAILY   ??? sertraline (ZOLOFT) tablet 25 mg  25 mg Oral DAILY   ??? hydrOXYzine pamoate (VISTARIL) capsule 25 mg  25 mg Oral TID PRN   ??? OLANZapine (ZyPREXA) tablet 2.5 mg  2.5 mg Oral Q6H PRN   ??? ziprasidone (GEODON) 10 mg in sterile water (preservative free) 0.5 mL injection  10 mg IntraMUSCular BID PRN   ??? benztropine (COGENTIN) tablet 1 mg  1 mg Oral BID PRN   ??? benztropine (COGENTIN) injection 1 mg  1 mg IntraMUSCular BID PRN   ??? zolpidem (AMBIEN) tablet 5 mg  5 mg Oral QHS PRN   ??? acetaminophen (TYLENOL) tablet 650 mg  650 mg Oral Q4H PRN    ??? ibuprofen (MOTRIN) tablet 400 mg  400 mg Oral Q8H PRN   ??? magnesium hydroxide (MILK OF MAGNESIA) 400 mg/5 mL oral suspension 30 mL  30 mL Oral DAILY PRN   ??? nicotine (NICODERM CQ) 21 mg/24 hr patch 1 Patch  1 Patch TransDERmal DAILY PRN      SCHEDULED MEDICATIONS:   Current Facility-Administered Medications   Medication Dose Route Frequency   ??? QUEtiapine (SEROquel) tablet 25 mg  25 mg Oral QHS   ??? aspirin chewable tablet 81 mg  81 mg Oral DAILY   ??? amLODIPine (NORVASC) tablet 10 mg  10 mg Oral DAILY   ??? atenolol (TENORMIN) tablet 25 mg  25 mg Oral DAILY   ??? sertraline (ZOLOFT) tablet 25 mg  25 mg Oral DAILY          ASSESSMENT & PLAN     DIAGNOSES REQUIRING ACTIVE TREATMENT AND MONITORING: (reviewed/updated 01/01/2017)  Patient Active Hospital Problem List:   Schizoaffective disorder ([place]) (12/30/2016)    Assessment: depression and irritability     Plan: continue same medications              I will continue to monitor blood levels (Depakote, Tegretol, lithium, clozapine---a drug with a narrow therapeutic index= NTI) and associated labs for drug therapy implemented that require intense monitoring for toxicity as deemed appropriate based on current medication side effects and pharmacodynamically determined drug 1/2 lives.    In summary, Andrw Mcguirt, is a 71 y.o.  male who presents with a severe exacerbation of the principal diagnosis of <principal problem not specified>  Patient's condition is worsening/not improving/not stable improving.  Patient requires continued inpatient hospitalization for further stabilization, safety monitoring and medication management.  I will continue to coordinate the provision of individual, milieu, occupational, group, and substance abuse therapies to address target symptoms/diagnoses as deemed appropriate for the individual patient.  A coordinated, multidisplinary treatment team round was conducted with the patient (this  team consists of the nurse, psychiatric unit pharmcist, Catering manager).     Complete current electronic health record for patient has been reviewed today including consultant notes, ancillary staff notes, nurses and psychiatric tech notes.    Suicide risk assessment completed and patient deemed to be of low risk for suicide at this time.     The following regarding medications was addressed during rounds with patient:   the risks and benefits of the proposed medication. The patient was given the opportunity to ask questions. Informed consent given to the use of the above medications. Will continue to adjust psychiatric and non-psychiatric medications (see above "medication" section and orders section for details) as  deemed appropriate & based upon diagnoses and response to treatment.     I will continue to order blood tests/labs and diagnostic tests as deemed appropriate and review results as they become available (see orders for details and above listed lab/test results).    I will order psychiatric records from previous psych hospitals to further elucidate the nature of patient's psychopathology and review once available.    I will gather additional collateral information from friends, family and o/p treatment team to further elucidate the nature of patient's psychopathology and baselline level of psychiatric functioning.         I certify that this patient's inpatient psychiatric hospital services furnished since the previous certification were, and continue to be, required for treatment that could reasonably be expected to improve the patient's condition, or for diagnostic study, and that the patient continues to need, on a daily basis, active treatment furnished directly by or requiring the supervision of inpatient psychiatric facility personnel. In addition, the hospital records show that services furnished were intensive treatment services, admission or related services, or equivalent services.     EXPECTED DISCHARGE DATE/DAY: TBD     DISPOSITION: Home       Signed By:   Randa Spike, MD  01/01/2017

## 2017-01-01 NOTE — Behavioral Health Treatment Team (Signed)
Blood lab work was collected and sent to the lab for observation.

## 2017-01-01 NOTE — Behavioral Health Treatment Team (Cosign Needed)
GROUP THERAPY PROGRESS NOTE    Corey LennoxMarvin Crawford is participating in [employer].     Group time: 15 minutes    Personal goal for participation:  No response    Goal orientation: community    Group therapy participation: minimal    Therapeutic interventions reviewed and discussed: yes    Impression of participation: non engagaed

## 2017-01-01 NOTE — Behavioral Health Treatment Team (Signed)
Patient isolated to room most of shift. Patient came out for meals; ate about 40-50%. Patient has not offered any somatic complaints today. Patient was joking a little when blood was being drawn for labs.

## 2017-01-02 LAB — LIPID PANEL
CHOL/HDL Ratio: 4 (ref 0–5.0)
Cholesterol, total: 185 MG/DL (ref <200)
HDL Cholesterol: 46 MG/DL
LDL, calculated: 121.6 MG/DL — ABNORMAL HIGH (ref 0–100)
Triglyceride: 87 MG/DL (ref <150)
VLDL, calculated: 17.4 MG/DL

## 2017-01-02 LAB — VITAMIN B12: Vitamin B12: 392 pg/mL (ref 211–911)

## 2017-01-02 LAB — CEA: CEA: 1.3 ng/mL

## 2017-01-02 MED FILL — HYDROXYZINE PAMOATE 25 MG CAP: 25 mg | ORAL | Qty: 1

## 2017-01-02 MED FILL — ATENOLOL 25 MG TAB: 25 mg | ORAL | Qty: 1

## 2017-01-02 MED FILL — AMLODIPINE 5 MG TAB: 5 mg | ORAL | Qty: 2

## 2017-01-02 MED FILL — SERTRALINE 50 MG TAB: 50 mg | ORAL | Qty: 1

## 2017-01-02 MED FILL — MAPAP (ACETAMINOPHEN) 325 MG TABLET: 325 mg | ORAL | Qty: 2

## 2017-01-02 MED FILL — CHILDREN'S ASPIRIN 81 MG CHEWABLE TABLET: 81 mg | ORAL | Qty: 1

## 2017-01-02 MED FILL — ZYPREXA 2.5 MG TABLET: 2.5 mg | ORAL | Qty: 1

## 2017-01-02 MED FILL — SEROQUEL 25 MG TABLET: 25 mg | ORAL | Qty: 1

## 2017-01-02 NOTE — Behavioral Health Treatment Team (Signed)
PSYCHIATRIC PROGRESS NOTE         Patient Name  Corey Crawford   Date of Birth June 23, 1946   CSN [id number]   Medical Record Number  [id number]      Age  71 y.o.   PCP Phys Other, MD   Admit date:  12/30/2016    [facility]   Date of Service  01/02/2017          PSYCHOTHERAPY SESSION NOTE:  Length of psychotherapy session: 15 minutes    Main condition/diagnosis/issues treated during session today, 01/02/2017 : depression     I employed Cognitive Behavioral therapy techniques, Reality-Oriented psychotherapy, as well as supportive psychotherapy in regards to various ongoing psychosocial stressors, including the following: pre-admission and current problems; housing issues; occupational issues; academic issues; legal issues; medical issues; and stress of hospitalization. Interpersonal relationship issues and psychodynamic conflicts explored.  Attempts made to alleviate maladaptive patterns. We, also, worked on issues of denial & effects of substance dependency/use     Overall, patient is not progressing    Treatment Plan Update (reviewed an updated 01/02/2017) :  I will modify psychotherapy tx plan by implementing more stress management strategies, building upon cognitive behavioral techniques, increasing coping skills, as well as shoring up psychological defenses).    An extended energy and skill set was needed to engage pt in psychotherapy due to some of the following: resistiveness, complexity, negativity, confrontational nature, hostile behaviors, and/or severe abnormalities in thought processes/psychosis resulting in the loss of expressive/receptive language communication skills.                            E & M PROGRESS NOTE:         HISTORY       CC: ". Pt admitted under a voluntary basis for severe depression with suicidal ideations  proving to be an imminent danger to self and an inability to care for self.    HISTORY OF PRESENT ILLNESS:     The patient, Corey Crawford, is a 71 y.o.  WHITE OR CAUCASIAN male with a past psychiatric history significant for depression and self harm thoughts , who presents at this time with complaints of (and/or evidence of) the following emotional symptoms: depression, suicidal thoughts/threats and anxiety.  Additional symptomatology include anxiety, difficulty sleeping, feeling depressed, feeling suicidal and poor concentration.  The above symptoms have been present for months . These symptoms are of moderate severity. These symptoms are constant  in nature.  The patient's condition has been precipitated by death of his wife and psychosocial stressors (lives alone  ).  He denied drinkng or using any drugs and he has no guns at home   01/01/17 he is feeling down and he is compliant with medications and no anger issues and no self harm behavior and no acting out behavior and no paranoid feeling   2/10- anxious, labile, preoccupation and poor attention. Lying in bed and states I am going to die"        SIDE EFFECTS: (reviewed/updated 01/02/2017)  None reported or admitted to.  No noted toxicity with use of Depakote/Tegretol/lithium/Clozaril/TCAs   ALLERGIES:(reviewed/updated 01/02/2017)  No Known Allergies   MEDICATIONS PRIOR TO ADMISSION:(reviewed/updated 01/02/2017)  Prescriptions Prior to Admission   Medication Sig   ??? amLODIPine (NORVASC) 10 mg tablet Take 1 Tab by mouth daily. Indications: HYPERTENSION   ??? atenolol (TENORMIN) 25 mg tablet Take 1 Tab by mouth  daily. Indications: HYPERTENSION   ??? lithium carbonate 300 mg capsule Take 3 Caps by mouth nightly. Indications: BIPOLAR DISORDER   ??? divalproex ER (DEPAKOTE ER) 500 mg ER tablet Take 4 Tabs by mouth nightly. Indications: scizoaffective dis   ??? QUEtiapine SR (SEROQUEL XR) 300 mg sr tablet Take 2 Tabs by mouth every evening. Indications: BIPOLAR DISORDER, SCHIZOPHRENIA      PAST MEDICAL HISTORY: Past medical history from the initial psychiatric  evaluation has been reviewed (reviewed/updated 01/02/2017) with no additional updates (I asked patient and no additional past medical history provided).   Past Medical History:   Diagnosis Date   ??? Depression    ??? Stroke [facility])    History reviewed. No pertinent surgical history.   SOCIAL HISTORY: Social history from the initial psychiatric evaluation has been reviewed (reviewed/updated 01/02/2017) with no additional updates (I asked patient and no additional social history provided).   Social History     Social History   ??? Marital status: LEGALLY SEPARATED     Spouse name: N/A   ??? Number of children: N/A   ??? Years of education: N/A     Occupational History   ??? Not on file.     Social History Main Topics   ??? Smoking status: Never Smoker   ??? Smokeless tobacco: Never Used   ??? Alcohol use No   ??? Drug use: No   ??? Sexual activity: No     Other Topics Concern   ??? Not on file     Social History Narrative    Lives in [place]. Lives by self. On SSDI. No job.      FAMILY HISTORY: Family history from the initial psychiatric evaluation has been reviewed (reviewed/updated 01/02/2017) with no additional updates (I asked patient and no additional family history provided).   Family History   Problem Relation Age of Onset   ??? Bipolar Disorder Father      committed suicide       REVIEW OF SYSTEMS: (reviewed/updated 01/02/2017)  Appetite:improved   Sleep: improved   All other Review of Systems: History obtained from the patient         MENTAL STATUS EXAM & VITALS     MENTAL STATUS EXAM (MSE):    MSE FINDINGS ARE WITHIN NORMAL LIMITS (WNL) UNLESS OTHERWISE STATED BELOW. ( ALL OF THE BELOW CATEGORIES OF THE MSE HAVE BEEN REVIEWED (reviewed 01/02/2017) AND UPDATED AS DEEMED APPROPRIATE )  General Presentation age appropriate, cooperative   Orientation oriented to time, place and person   Vital Signs  See below (reviewed 01/02/2017); Vital Signs (BP, Pulse, & Temp) are within normal limits if not listed below.    Gait and Station Stable/steady, no ataxia   Musculoskeletal System No extrapyramidal symptoms (EPS); no abnormal muscular movements or Tardive Dyskinesia (TD); muscle strength and tone are within normal limits   Language No aphasia or dysarthria   Speech:  normal pitch and normal volume   Thought Processes logical; normal rate of thoughts; fair abstract reasoning/computation   Thought Associations goal directed   Thought Content free of delusions   Suicidal Ideations no plan    Homicidal Ideations no plan    Mood:  depressed   Affect:  depressed   Memory recent  fair   Memory remote:  fair   Concentration/Attention:  good   Fund of Knowledge average   Insight:  good   Reliability fair   Judgment:  fair  VITALS:     Patient Vitals for the past 24 hrs:   Temp Pulse Resp BP   01/02/17 0546 97.6 ??F (36.4 ??C) (!) 50 16 125/60     Wt Readings from Last 3 Encounters:   12/30/16 94.5 kg (208 lb 4.8 oz)   05/13/15 95.3 kg (210 lb)     Temp Readings from Last 3 Encounters:   01/02/17 97.6 ??F (36.4 ??C)   05/31/15 97.3 ??F (36.3 ??C)     BP Readings from Last 3 Encounters:   01/02/17 125/60   05/31/15 140/73     Pulse Readings from Last 3 Encounters:   01/02/17 (!) 50   05/31/15 66            DATA     LABORATORY DATA:(reviewed/updated 01/02/2017)  Recent Results (from the past 24 hour(s))   VITAMIN B12    Collection Time: 01/02/17  5:12 AM   Result Value Ref Range    Vitamin B12 392 211 - 911 pg/mL   CEA    Collection Time: 01/02/17  5:12 AM   Result Value Ref Range    CEA 1.3 ng/mL   LIPID PANEL    Collection Time: 01/02/17  5:12 AM   Result Value Ref Range    LIPID PROFILE          Cholesterol, total 185 <200 MG/DL    Triglyceride 87 <161 MG/DL    HDL Cholesterol 46 MG/DL    LDL, calculated 096.0 (H) 0 - 100 MG/DL    VLDL, calculated 45.4 MG/DL    CHOL/HDL Ratio 4.0 0 - 5.0       Lab Results   Component Value Date/Time    Valproic acid 112 (H) 05/29/2015 05:12 AM     Lab Results   Component Value Date/Time     Lithium level 1.02 05/29/2015 05:12 AM      RADIOLOGY REPORTS:(reviewed/updated 01/02/2017)  Xr Chest Port    Result Date: 12/31/2016  EXAM:  XR CHEST PORT INDICATION:  Shortness of breath. COMPARISON:  No old study. FINDINGS:  An AP portable view was obtained of the chest.  An old left chest injury with metallic residuals, with left thoracotomy/rib deformities and with an old left pleural reaction are seen.  The right lung is clear.  The heart is within normal size.      IMPRESSION: Old left chest injury. Clear right lung          MEDICATIONS     ALL MEDICATIONS:   Current Facility-Administered Medications   Medication Dose Route Frequency   ??? QUEtiapine (SEROquel) tablet 25 mg  25 mg Oral QHS   ??? aspirin chewable tablet 81 mg  81 mg Oral DAILY   ??? amLODIPine (NORVASC) tablet 10 mg  10 mg Oral DAILY   ??? atenolol (TENORMIN) tablet 25 mg  25 mg Oral DAILY   ??? sertraline (ZOLOFT) tablet 25 mg  25 mg Oral DAILY   ??? hydrOXYzine pamoate (VISTARIL) capsule 25 mg  25 mg Oral TID PRN   ??? OLANZapine (ZyPREXA) tablet 2.5 mg  2.5 mg Oral Q6H PRN   ??? ziprasidone (GEODON) 10 mg in sterile water (preservative free) 0.5 mL injection  10 mg IntraMUSCular BID PRN   ??? benztropine (COGENTIN) tablet 1 mg  1 mg Oral BID PRN   ??? benztropine (COGENTIN) injection 1 mg  1 mg IntraMUSCular BID PRN   ??? zolpidem (AMBIEN) tablet 5 mg  5 mg Oral QHS PRN   ???  acetaminophen (TYLENOL) tablet 650 mg  650 mg Oral Q4H PRN   ??? ibuprofen (MOTRIN) tablet 400 mg  400 mg Oral Q8H PRN   ??? magnesium hydroxide (MILK OF MAGNESIA) 400 mg/5 mL oral suspension 30 mL  30 mL Oral DAILY PRN   ??? nicotine (NICODERM CQ) 21 mg/24 hr patch 1 Patch  1 Patch TransDERmal DAILY PRN      SCHEDULED MEDICATIONS:   Current Facility-Administered Medications   Medication Dose Route Frequency   ??? QUEtiapine (SEROquel) tablet 25 mg  25 mg Oral QHS   ??? aspirin chewable tablet 81 mg  81 mg Oral DAILY   ??? amLODIPine (NORVASC) tablet 10 mg  10 mg Oral DAILY    ??? atenolol (TENORMIN) tablet 25 mg  25 mg Oral DAILY   ??? sertraline (ZOLOFT) tablet 25 mg  25 mg Oral DAILY          ASSESSMENT & PLAN     DIAGNOSES REQUIRING ACTIVE TREATMENT AND MONITORING: (reviewed/updated 01/02/2017)  Patient Active Hospital Problem List:   Schizoaffective disorder (HCC) (12/30/2016)    Assessment: depression and irritability     Plan: continue same medications      2/10- labs reviewed, neurology eval. Ct with current med        I will continue to monitor blood levels (Depakote, Tegretol, lithium, clozapine---a drug with a narrow therapeutic index= NTI) and associated labs for drug therapy implemented that require intense monitoring for toxicity as deemed appropriate based on current medication side effects and pharmacodynamically determined drug 1/2 lives.    In summary, Jahdiel Krol, is a 71 y.o.  male who presents with a severe exacerbation of the principal diagnosis of Schizoaffective disorder (HCC)  Patient's condition is worsening/not improving/not stable improving.  Patient requires continued inpatient hospitalization for further stabilization, safety monitoring and medication management.  I will continue to coordinate the provision of individual, milieu, occupational, group, and substance abuse therapies to address target symptoms/diagnoses as deemed appropriate for the individual patient.  A coordinated, multidisplinary treatment team round was conducted with the patient (this team consists of the nurse, psychiatric unit pharmcist, Administrator).     Complete current electronic health record for patient has been reviewed today including consultant notes, ancillary staff notes, nurses and psychiatric tech notes.    Suicide risk assessment completed and patient deemed to be of low risk for suicide at this time.     The following regarding medications was addressed during rounds with patient:   the risks and benefits of the proposed medication. The patient was given  the opportunity to ask questions. Informed consent given to the use of the above medications. Will continue to adjust psychiatric and non-psychiatric medications (see above "medication" section and orders section for details) as deemed appropriate & based upon diagnoses and response to treatment.     I will continue to order blood tests/labs and diagnostic tests as deemed appropriate and review results as they become available (see orders for details and above listed lab/test results).    I will order psychiatric records from previous psych hospitals to further elucidate the nature of patient's psychopathology and review once available.    I will gather additional collateral information from friends, family and o/p treatment team to further elucidate the nature of patient's psychopathology and baselline level of psychiatric functioning.         I certify that this patient's inpatient psychiatric hospital services furnished since the previous certification were, and continue to be,  required for treatment that could reasonably be expected to improve the patient's condition, or for diagnostic study, and that the patient continues to need, on a daily basis, active treatment furnished directly by or requiring the supervision of inpatient psychiatric facility personnel. In addition, the hospital records show that services furnished were intensive treatment services, admission or related services, or equivalent services.    EXPECTED DISCHARGE DATE/DAY: TBD     DISPOSITION: Home       Signed By:   Onnie Graham, MD  01/02/2017

## 2017-01-02 NOTE — Behavioral Health Treatment Team (Signed)
Problem: Suicide/Homicide (Adult/Pediatric)  Goal: *STG/LTG: Complies with medication therapy  Outcome: Progressing Towards Goal  Pt is mostly isolative from the milieu. Pt presents as labile and anxious. Pt appeared having somatic delusions and paranoid about his inability of his body functions of not able to raise his arms and move his legs. Pt has been observed of having no problems of moving his arms and legs. Pt is meds/meals compliant.  Will continue to offer reality orientation, monitor q 15 for safety, mood and behavior changes.  ??

## 2017-01-02 NOTE — Behavioral Health Treatment Team (Signed)
Pt refused to accept meals and snacks, C/o body aches and received Ibuprofen 400 mg po at 2005 and was moderately effective. Accepted 120 ml of orange juice with medication. Staff continued  To encourage and redirect to eat meals and move around the unit. Pt walked up to the medication window x 2 this evening.

## 2017-01-02 NOTE — Progress Notes (Signed)
Problem: Dementia-BEHAVIORAL HEALTH (Adult/Pediatric)  Goal: *STG: Remains safe in hospital  Outcome: Progressing Towards Goal  Pt slept 7 hours. Pt calm and cooperative during lab draw this morning. Pt had uneventful night and required no PRN's. Pt had no complaints or signs of distress throughout night. Respirations were unlabored. Continuing to monitor with q15 min rounds for safety.

## 2017-01-03 LAB — EKG 12-LEAD
Atrial Rate: 51 {beats}/min
P Axis: 26 degrees
P-R Interval: 144 ms
Q-T Interval: 450 ms
QRS Duration: 92 ms
QTc Calculation (Bazett): 414 ms
R Axis: 10 degrees
T Axis: 55 degrees
Ventricular Rate: 51 {beats}/min

## 2017-01-03 LAB — EKG, 12 LEAD, INITIAL
Atrial Rate: 51 {beats}/min
Calculated P Axis: 26 degrees
Calculated R Axis: 10 degrees
Calculated T Axis: 55 degrees
P-R Interval: 144 ms
Q-T Interval: 450 ms
QRS Duration: 92 ms
QTC Calculation (Bezet): 414 ms
Ventricular Rate: 51 {beats}/min

## 2017-01-03 MED FILL — HYDROXYZINE PAMOATE 25 MG CAP: 25 mg | ORAL | Qty: 1

## 2017-01-03 MED FILL — AMLODIPINE 5 MG TAB: 5 mg | ORAL | Qty: 2

## 2017-01-03 MED FILL — ZYPREXA 2.5 MG TABLET: 2.5 mg | ORAL | Qty: 1

## 2017-01-03 MED FILL — CHILDREN'S ASPIRIN 81 MG CHEWABLE TABLET: 81 mg | ORAL | Qty: 1

## 2017-01-03 MED FILL — SERTRALINE 50 MG TAB: 50 mg | ORAL | Qty: 1

## 2017-01-03 MED FILL — IBUPROFEN 400 MG TAB: 400 mg | ORAL | Qty: 1

## 2017-01-03 MED FILL — ATENOLOL 25 MG TAB: 25 mg | ORAL | Qty: 1

## 2017-01-03 MED FILL — SEROQUEL 25 MG TABLET: 25 mg | ORAL | Qty: 1

## 2017-01-03 NOTE — Progress Notes (Signed)
Problem: Dementia-BEHAVIORAL HEALTH (Adult/Pediatric)  Goal: *STG: Seeks staff when feelings of anxiety and fear arise  Outcome: Progressing Towards Goal  Pt is alert/oriented x4. Calm and Cooperative. Pt gives little interaction. Pt denies SI/HI and denies A/V hallucinations. Attending groups. Mood is good. Meds/meal compliant. No PRNs given this shift. No behavioral issues. Will continue to monitor pt with q15 min rounds for safety.

## 2017-01-03 NOTE — Progress Notes (Signed)
Problem: Dementia-BEHAVIORAL HEALTH (Adult/Pediatric)  Goal: *STG: Remains safe in hospital  Outcome: Progressing Towards Goal  Pt slept 5 hours. Pt had uneventful night and required no PRN's. Pt had no complaints or signs of distress throughout night. Respirations were unlabored. Continuing to monitor with q15 min rounds for safety.

## 2017-01-03 NOTE — Behavioral Health Treatment Team (Signed)
Problem: Suicide/Homicide (Adult/Pediatric)  Goal: *STG/LTG: Complies with medication therapy  Outcome: Progressing Towards Goal  Pt is isolative from the milieu this shift. Pt presents as labile and paranoid. Pt appeared having somatic delusions and paranoid about his inability of his body functions of not able to raise his arms and move his legs. Pt has been observed of having no problems of moving his arms and walking. Pt refused to come out to ear. Lunch was brought in to him. Pt is meds/meals compliant.  Will continue to offer reality orientation, monitor q 15 for safety, mood and behavior changes.

## 2017-01-03 NOTE — Behavioral Health Treatment Team (Signed)
PSYCHIATRIC PROGRESS NOTE         Patient Name  Corey Crawford   Date of Birth 25-Mar-1946   CSN [id number]   Medical Record Number  [id number]      Age  71 y.o.   PCP Phys Other, MD   Admit date:  12/30/2016    Room Number  319/01  @ [facility]   Date of Service  01/03/2017          PSYCHOTHERAPY SESSION NOTE:  Length of psychotherapy session: 15 minutes    Main condition/diagnosis/issues treated during session today, 01/03/2017 : depression     I employed Cognitive Behavioral therapy techniques, Reality-Oriented psychotherapy, as well as supportive psychotherapy in regards to various ongoing psychosocial stressors, including the following: pre-admission and current problems; housing issues; occupational issues; academic issues; legal issues; medical issues; and stress of hospitalization. Interpersonal relationship issues and psychodynamic conflicts explored.  Attempts made to alleviate maladaptive patterns. We, also, worked on issues of denial & effects of substance dependency/use     Overall, patient is not progressing    Treatment Plan Update (reviewed an updated 01/03/2017) :  I will modify psychotherapy tx plan by implementing more stress management strategies, building upon cognitive behavioral techniques, increasing coping skills, as well as shoring up psychological defenses).    An extended energy and skill set was needed to engage pt in psychotherapy due to some of the following: resistiveness, complexity, negativity, confrontational nature, hostile behaviors, and/or severe abnormalities in thought processes/psychosis resulting in the loss of expressive/receptive language communication skills.                            E & M PROGRESS NOTE:         HISTORY       CC: ". Pt admitted under a voluntary basis for severe depression with suicidal ideations  proving to be an imminent danger to self and an inability to care for self.    HISTORY OF PRESENT ILLNESS:     The patient, Corey Crawford, is a 71 y.o.  WHITE OR CAUCASIAN male with a past psychiatric history significant for depression and self harm thoughts , who presents at this time with complaints of (and/or evidence of) the following emotional symptoms: depression, suicidal thoughts/threats and anxiety.  Additional symptomatology include anxiety, difficulty sleeping, feeling depressed, feeling suicidal and poor concentration.  The above symptoms have been present for months . These symptoms are of moderate severity. These symptoms are constant  in nature.  The patient's condition has been precipitated by death of his wife and psychosocial stressors (lives alone  ).  He denied drinkng or using any drugs and he has no guns at home   01/01/17 he is feeling down and he is compliant with medications and no anger issues and no self harm behavior and no acting out behavior and no paranoid feeling   2/10- anxious, labile, preoccupation and poor attention. Lying in bed and states I am going to die"  2/11- less anxious and lot calmer today. Remains preoccupied with health and believe "I am going to die" paranoid ideation+. Evaluated by Neurology        SIDE EFFECTS: (reviewed/updated 01/03/2017)  None reported or admitted to.  No noted toxicity with use of Depakote/Tegretol/lithium/Clozaril/TCAs   ALLERGIES:(reviewed/updated 01/03/2017)  No Known Allergies   MEDICATIONS PRIOR TO ADMISSION:(reviewed/updated 01/03/2017)  Prescriptions Prior to Admission   Medication Sig   ??? amLODIPine (NORVASC)  10 mg tablet Take 1 Tab by mouth daily. Indications: HYPERTENSION   ??? atenolol (TENORMIN) 25 mg tablet Take 1 Tab by mouth daily. Indications: HYPERTENSION   ??? lithium carbonate 300 mg capsule Take 3 Caps by mouth nightly. Indications: BIPOLAR DISORDER   ??? divalproex ER (DEPAKOTE ER) 500 mg ER tablet Take 4 Tabs by mouth nightly. Indications: scizoaffective dis   ??? QUEtiapine SR (SEROQUEL XR) 300 mg sr tablet Take 2 Tabs by mouth every  evening. Indications: BIPOLAR DISORDER, SCHIZOPHRENIA      PAST MEDICAL HISTORY: Past medical history from the initial psychiatric evaluation has been reviewed (reviewed/updated 01/03/2017) with no additional updates (I asked patient and no additional past medical history provided).   Past Medical History:   Diagnosis Date   ??? Depression    ??? Stroke [facility])    History reviewed. No pertinent surgical history.   SOCIAL HISTORY: Social history from the initial psychiatric evaluation has been reviewed (reviewed/updated 01/03/2017) with no additional updates (I asked patient and no additional social history provided).   Social History     Social History   ??? Marital status: LEGALLY SEPARATED     Spouse name: N/A   ??? Number of children: N/A   ??? Years of education: N/A     Occupational History   ??? Not on file.     Social History Main Topics   ??? Smoking status: Never Smoker   ??? Smokeless tobacco: Never Used   ??? Alcohol use No   ??? Drug use: No   ??? Sexual activity: No     Other Topics Concern   ??? Not on file     Social History Narrative    Lives in [place]. Lives by self. On SSDI. No job.      FAMILY HISTORY: Family history from the initial psychiatric evaluation has been reviewed (reviewed/updated 01/03/2017) with no additional updates (I asked patient and no additional family history provided).   Family History   Problem Relation Age of Onset   ??? Bipolar Disorder Father      committed suicide       REVIEW OF SYSTEMS: (reviewed/updated 01/03/2017)  Appetite:improved   Sleep: improved   All other Review of Systems: History obtained from the patient         MENTAL STATUS EXAM & VITALS     MENTAL STATUS EXAM (MSE):    MSE FINDINGS ARE WITHIN NORMAL LIMITS (WNL) UNLESS OTHERWISE STATED BELOW. ( ALL OF THE BELOW CATEGORIES OF THE MSE HAVE BEEN REVIEWED (reviewed 01/03/2017) AND UPDATED AS DEEMED APPROPRIATE )  General Presentation age appropriate, cooperative   Orientation oriented to time, place and person    Vital Signs  See below (reviewed 01/03/2017); Vital Signs (BP, Pulse, & Temp) are within normal limits if not listed below.   Gait and Station Stable/steady, no ataxia   Musculoskeletal System No extrapyramidal symptoms (EPS); no abnormal muscular movements or Tardive Dyskinesia (TD); muscle strength and tone are within normal limits   Language No aphasia or dysarthria   Speech:  normal pitch and normal volume   Thought Processes logical; normal rate of thoughts; fair abstract reasoning/computation   Thought Associations goal directed   Thought Content free of delusions   Suicidal Ideations no plan    Homicidal Ideations no plan    Mood:  depressed   Affect:  depressed   Memory recent  fair   Memory remote:  fair   Concentration/Attention:  good   Fund of Knowledge  average   Insight:  good   Reliability fair   Judgment:  fair          VITALS:     Patient Vitals for the past 24 hrs:   Temp Pulse Resp BP   01/03/17 0508 97.9 ??F (36.6 ??C) (!) 50 16 109/46     Wt Readings from Last 3 Encounters:   12/30/16 94.5 kg (208 lb 4.8 oz)   05/13/15 95.3 kg (210 lb)     Temp Readings from Last 3 Encounters:   01/03/17 97.9 ??F (36.6 ??C)   05/31/15 97.3 ??F (36.3 ??C)     BP Readings from Last 3 Encounters:   01/03/17 109/46   05/31/15 140/73     Pulse Readings from Last 3 Encounters:   01/03/17 (!) 50   05/31/15 66            DATA     LABORATORY DATA:(reviewed/updated 01/03/2017)  No results found for this or any previous visit (from the past 24 hour(s)).  Lab Results   Component Value Date/Time    Valproic acid 112 (H) 05/29/2015 05:12 AM     Lab Results   Component Value Date/Time    Lithium level 1.02 05/29/2015 05:12 AM      RADIOLOGY REPORTS:(reviewed/updated 01/03/2017)  Xr Chest Port    Result Date: 12/31/2016  EXAM:  XR CHEST PORT INDICATION:  Shortness of breath. COMPARISON:  No old study. FINDINGS:  An AP portable view was obtained of the chest.  An old left chest injury with metallic residuals, with left thoracotomy/rib  deformities and with an old left pleural reaction are seen.  The right lung is clear.  The heart is within normal size.      IMPRESSION: Old left chest injury. Clear right lung          MEDICATIONS     ALL MEDICATIONS:   Current Facility-Administered Medications   Medication Dose Route Frequency   ??? QUEtiapine (SEROquel) tablet 25 mg  25 mg Oral QHS   ??? aspirin chewable tablet 81 mg  81 mg Oral DAILY   ??? amLODIPine (NORVASC) tablet 10 mg  10 mg Oral DAILY   ??? atenolol (TENORMIN) tablet 25 mg  25 mg Oral DAILY   ??? sertraline (ZOLOFT) tablet 25 mg  25 mg Oral DAILY   ??? hydrOXYzine pamoate (VISTARIL) capsule 25 mg  25 mg Oral TID PRN   ??? OLANZapine (ZyPREXA) tablet 2.5 mg  2.5 mg Oral Q6H PRN   ??? ziprasidone (GEODON) 10 mg in sterile water (preservative free) 0.5 mL injection  10 mg IntraMUSCular BID PRN   ??? benztropine (COGENTIN) tablet 1 mg  1 mg Oral BID PRN   ??? benztropine (COGENTIN) injection 1 mg  1 mg IntraMUSCular BID PRN   ??? zolpidem (AMBIEN) tablet 5 mg  5 mg Oral QHS PRN   ??? acetaminophen (TYLENOL) tablet 650 mg  650 mg Oral Q4H PRN   ??? ibuprofen (MOTRIN) tablet 400 mg  400 mg Oral Q8H PRN   ??? magnesium hydroxide (MILK OF MAGNESIA) 400 mg/5 mL oral suspension 30 mL  30 mL Oral DAILY PRN   ??? nicotine (NICODERM CQ) 21 mg/24 hr patch 1 Patch  1 Patch TransDERmal DAILY PRN      SCHEDULED MEDICATIONS:   Current Facility-Administered Medications   Medication Dose Route Frequency   ??? QUEtiapine (SEROquel) tablet 25 mg  25 mg Oral QHS   ??? aspirin chewable tablet 81 mg  81 mg  Oral DAILY   ??? amLODIPine (NORVASC) tablet 10 mg  10 mg Oral DAILY   ??? atenolol (TENORMIN) tablet 25 mg  25 mg Oral DAILY   ??? sertraline (ZOLOFT) tablet 25 mg  25 mg Oral DAILY          ASSESSMENT & PLAN     DIAGNOSES REQUIRING ACTIVE TREATMENT AND MONITORING: (reviewed/updated 01/03/2017)  Patient Active Hospital Problem List:   Schizoaffective disorder (HCC) (12/30/2016)    Assessment: depression and irritability      Plan: continue same medications      2/10- labs reviewed, neurology eval. Ct with current med  2/11- ct with tx plan        I will continue to monitor blood levels (Depakote, Tegretol, lithium, clozapine---a drug with a narrow therapeutic index= NTI) and associated labs for drug therapy implemented that require intense monitoring for toxicity as deemed appropriate based on current medication side effects and pharmacodynamically determined drug 1/2 lives.    In summary, Kyo Cocuzza, is a 71 y.o.  male who presents with a severe exacerbation of the principal diagnosis of Schizoaffective disorder (HCC)  Patient's condition is worsening/not improving/not stable improving.  Patient requires continued inpatient hospitalization for further stabilization, safety monitoring and medication management.  I will continue to coordinate the provision of individual, milieu, occupational, group, and substance abuse therapies to address target symptoms/diagnoses as deemed appropriate for the individual patient.  A coordinated, multidisplinary treatment team round was conducted with the patient (this team consists of the nurse, psychiatric unit pharmcist, Administrator).     Complete current electronic health record for patient has been reviewed today including consultant notes, ancillary staff notes, nurses and psychiatric tech notes.    Suicide risk assessment completed and patient deemed to be of low risk for suicide at this time.     The following regarding medications was addressed during rounds with patient:   the risks and benefits of the proposed medication. The patient was given the opportunity to ask questions. Informed consent given to the use of the above medications. Will continue to adjust psychiatric and non-psychiatric medications (see above "medication" section and orders section for details) as deemed appropriate & based upon diagnoses and response to treatment.      I will continue to order blood tests/labs and diagnostic tests as deemed appropriate and review results as they become available (see orders for details and above listed lab/test results).    I will order psychiatric records from previous psych hospitals to further elucidate the nature of patient's psychopathology and review once available.    I will gather additional collateral information from friends, family and o/p treatment team to further elucidate the nature of patient's psychopathology and baselline level of psychiatric functioning.         I certify that this patient's inpatient psychiatric hospital services furnished since the previous certification were, and continue to be, required for treatment that could reasonably be expected to improve the patient's condition, or for diagnostic study, and that the patient continues to need, on a daily basis, active treatment furnished directly by or requiring the supervision of inpatient psychiatric facility personnel. In addition, the hospital records show that services furnished were intensive treatment services, admission or related services, or equivalent services.    EXPECTED DISCHARGE DATE/DAY: TBD     DISPOSITION: Home       Signed By:   Onnie Graham, MD  01/03/2017

## 2017-01-04 LAB — ANA BY MULTIPLEX FLOW IA, QL
ANA, Direct: NEGATIVE
ANA: NEGATIVE

## 2017-01-04 LAB — RPR
RPR: NONREACTIVE
RPR: NONREACTIVE

## 2017-01-04 MED ORDER — SERTRALINE 50 MG TAB
50 mg | Freq: Every day | ORAL | Status: DC
Start: 2017-01-04 — End: 2017-01-07
  Administered 2017-01-05 – 2017-01-07 (×3): via ORAL

## 2017-01-04 MED ORDER — QUETIAPINE 25 MG TAB
25 mg | Freq: Every evening | ORAL | Status: DC
Start: 2017-01-04 — End: 2017-01-05
  Administered 2017-01-05: 02:00:00 via ORAL

## 2017-01-04 MED FILL — ATENOLOL 25 MG TAB: 25 mg | ORAL | Qty: 1

## 2017-01-04 MED FILL — CHILDREN'S ASPIRIN 81 MG CHEWABLE TABLET: 81 mg | ORAL | Qty: 1

## 2017-01-04 MED FILL — SEROQUEL 25 MG TABLET: 25 mg | ORAL | Qty: 1

## 2017-01-04 MED FILL — ZYPREXA 2.5 MG TABLET: 2.5 mg | ORAL | Qty: 1

## 2017-01-04 MED FILL — SERTRALINE 50 MG TAB: 50 mg | ORAL | Qty: 1

## 2017-01-04 MED FILL — AMLODIPINE 5 MG TAB: 5 mg | ORAL | Qty: 2

## 2017-01-04 NOTE — Behavioral Health Treatment Team (Cosign Needed)
Pt did not attend coping skills group.

## 2017-01-04 NOTE — Behavioral Health Treatment Team (Signed)
Pt observed in bed this shift refusing meals, complaint with meds.  Pt nonverbal upon approach, continue to lay in bed with his arm folded across his chest.  Pt offered no self disclosures at this time, attended no groups or activities.  Pt remain on Q 15 min checks for safety, will monitor and offer support when needed.

## 2017-01-04 NOTE — Progress Notes (Signed)
Problem: Dementia-BEHAVIORAL HEALTH (Adult/Pediatric)  Goal: *STG: Remains safe in hospital  Outcome: Progressing Towards Goal  Pt slept 8 hours. Pt had uneventful night and required no PRN's. Pt had no complaints or signs of distress throughout night. Respirations were unlabored. Continuing to monitor with q15 min rounds for safety.

## 2017-01-04 NOTE — Behavioral Health Treatment Team (Signed)
Problem: Suicide/Homicide (Adult/Pediatric)  Goal: *STG/LTG: Complies with medication therapy  Outcome: Progressing Towards Goal  Pt is again isolative from??the milieu this evening shift. Pt is alert but remains very paranoid. Pt refused to come out of his room. Meds/meals were brought to him to encourage intake. Will continue to monitor q 15 for safety, mood and behavior changes.

## 2017-01-04 NOTE — Behavioral Health Treatment Team (Cosign Needed)
Pt did not attend creative expression group.

## 2017-01-04 NOTE — Behavioral Health Treatment Team (Signed)
PSYCHIATRIC PROGRESS NOTE         Patient Name  Corey Crawford   Date of Birth 1946/05/20   CSN [id number]   Medical Record Number  [id number]      Age  71 y.o.   PCP Phys Other, MD   Admit date:  12/30/2016    Room Number  319/01  @ [facility]   Date of Service  01/04/2017          PSYCHOTHERAPY SESSION NOTE:  Length of psychotherapy session: 15 minutes    Main condition/diagnosis/issues treated during session today, 01/04/2017 : depression     I employed Cognitive Behavioral therapy techniques, Reality-Oriented psychotherapy, as well as supportive psychotherapy in regards to various ongoing psychosocial stressors, including the following: pre-admission and current problems; housing issues; occupational issues; academic issues; legal issues; medical issues; and stress of hospitalization. Interpersonal relationship issues and psychodynamic conflicts explored.  Attempts made to alleviate maladaptive patterns. We, also, worked on issues of denial & effects of substance dependency/use     Overall, patient is not progressing    Treatment Plan Update (reviewed an updated 01/04/2017) :  I will modify psychotherapy tx plan by implementing more stress management strategies, building upon cognitive behavioral techniques, increasing coping skills, as well as shoring up psychological defenses).    An extended energy and skill set was needed to engage pt in psychotherapy due to some of the following: resistiveness, complexity, negativity, confrontational nature, hostile behaviors, and/or severe abnormalities in thought processes/psychosis resulting in the loss of expressive/receptive language communication skills.                            E & M PROGRESS NOTE:         HISTORY       CC: ". Pt admitted under a voluntary basis for severe depression with suicidal ideations  proving to be an imminent danger to self and an inability to care for self.    HISTORY OF PRESENT ILLNESS:     The patient, Corey Crawford, is a 71 y.o.  WHITE OR CAUCASIAN male with a past psychiatric history significant for depression and self harm thoughts , who presents at this time with complaints of (and/or evidence of) the following emotional symptoms: depression, suicidal thoughts/threats and anxiety.  Additional symptomatology include anxiety, difficulty sleeping, feeling depressed, feeling suicidal and poor concentration.  The above symptoms have been present for months . These symptoms are of moderate severity. These symptoms are constant  in nature.  The patient's condition has been precipitated by death of his wife and psychosocial stressors (lives alone  ).  He denied drinkng or using any drugs and he has no guns at home   01/01/17 he is feeling down and he is compliant with medications and no anger issues and no self harm behavior and no acting out behavior and no paranoid feeling   2/10- anxious, labile, preoccupation and poor attention. Lying in bed and states I am going to die"  2/11- less anxious and lot calmer today. Remains preoccupied with health and believe "I am going to die" paranoid ideation+. Evaluated by Neurology  01/04/17 he is doing the same and he is depressed and anxious and stated he is in pain and can not walk well as his knees hurts and needs pain medications and he is not feeling angry or agitated and no aggressive behavior andno paranoid feeling but has lack of energy  SIDE EFFECTS: (reviewed/updated 01/04/2017)  None reported or admitted to.  No noted toxicity with use of Depakote/Tegretol/lithium/Clozaril/TCAs   ALLERGIES:(reviewed/updated 01/04/2017)  No Known Allergies   MEDICATIONS PRIOR TO ADMISSION:(reviewed/updated 01/04/2017)  Prescriptions Prior to Admission   Medication Sig   ??? amLODIPine (NORVASC) 10 mg tablet Take 1 Tab by mouth daily. Indications: HYPERTENSION   ??? atenolol (TENORMIN) 25 mg tablet Take 1 Tab by mouth daily. Indications: HYPERTENSION    ??? lithium carbonate 300 mg capsule Take 3 Caps by mouth nightly. Indications: BIPOLAR DISORDER   ??? divalproex ER (DEPAKOTE ER) 500 mg ER tablet Take 4 Tabs by mouth nightly. Indications: scizoaffective dis   ??? QUEtiapine SR (SEROQUEL XR) 300 mg sr tablet Take 2 Tabs by mouth every evening. Indications: BIPOLAR DISORDER, SCHIZOPHRENIA      PAST MEDICAL HISTORY: Past medical history from the initial psychiatric evaluation has been reviewed (reviewed/updated 01/04/2017) with no additional updates (I asked patient and no additional past medical history provided).   Past Medical History:   Diagnosis Date   ??? Depression    ??? Stroke [facility](HCC)    History reviewed. No pertinent surgical history.   SOCIAL HISTORY: Social history from the initial psychiatric evaluation has been reviewed (reviewed/updated 01/04/2017) with no additional updates (I asked patient and no additional social history provided).   Social History     Social History   ??? Marital status: LEGALLY SEPARATED     Spouse name: N/A   ??? Number of children: N/A   ??? Years of education: N/A     Occupational History   ??? Not on file.     Social History Main Topics   ??? Smoking status: Never Smoker   ??? Smokeless tobacco: Never Used   ??? Alcohol use No   ??? Drug use: No   ??? Sexual activity: No     Other Topics Concern   ??? Not on file     Social History Narrative    [place] in [place] Lives by self. On SSDI. No job.      FAMILY HISTORY: Family history from the initial psychiatric evaluation has been reviewed (reviewed/updated 01/04/2017) with no additional updates (I asked patient and no additional family history provided).   Family History   Problem Relation Age of Onset   ??? Bipolar Disorder Father      committed suicide       REVIEW OF SYSTEMS: (reviewed/updated 01/04/2017)  Appetite:improved   Sleep: improved   All other Review of Systems: History obtained from the patient         MENTAL STATUS EXAM & VITALS     MENTAL STATUS EXAM (MSE):     MSE FINDINGS ARE WITHIN NORMAL LIMITS (WNL) UNLESS OTHERWISE STATED BELOW. ( ALL OF THE BELOW CATEGORIES OF THE MSE HAVE BEEN REVIEWED (reviewed 01/04/2017) AND UPDATED AS DEEMED APPROPRIATE )  General Presentation age appropriate, cooperative   Orientation oriented to time, place and person   Vital Signs  See below (reviewed 01/04/2017); Vital Signs (BP, Pulse, & Temp) are within normal limits if not listed below.   Gait and Station Stable/steady, no ataxia   Musculoskeletal System No extrapyramidal symptoms (EPS); no abnormal muscular movements or Tardive Dyskinesia (TD); muscle strength and tone are within normal limits   Language No aphasia or dysarthria   Speech:  normal pitch and normal volume   Thought Processes logical; normal rate of thoughts; fair abstract reasoning/computation   Thought Associations goal directed   Thought Content free of  delusions   Suicidal Ideations no plan    Homicidal Ideations no plan    Mood:  depressed   Affect:  depressed   Memory recent  fair   Memory remote:  fair   Concentration/Attention:  good   Fund of Knowledge average   Insight:  good   Reliability fair   Judgment:  fair          VITALS:     Patient Vitals for the past 24 hrs:   Temp Pulse Resp BP SpO2   01/04/17 0600 98.1 ??F (36.7 ??C) (!) 47 16 130/62 99 %   01/03/17 1246 - - - 135/59 -     Wt Readings from Last 3 Encounters:   12/30/16 94.5 kg (208 lb 4.8 oz)   05/13/15 95.3 kg (210 lb)     Temp Readings from Last 3 Encounters:   01/04/17 98.1 ??F (36.7 ??C)   05/31/15 97.3 ??F (36.3 ??C)     BP Readings from Last 3 Encounters:   01/04/17 130/62   05/31/15 140/73     Pulse Readings from Last 3 Encounters:   01/04/17 (!) 47   05/31/15 66            DATA     LABORATORY DATA:(reviewed/updated 01/04/2017)  No results found for this or any previous visit (from the past 24 hour(s)).  Lab Results   Component Value Date/Time    Valproic acid 112 (H) 05/29/2015 05:12 AM     Lab Results   Component Value Date/Time     Lithium level 1.02 05/29/2015 05:12 AM      RADIOLOGY REPORTS:(reviewed/updated 01/04/2017)  Xr Chest Port    Result Date: 12/31/2016  EXAM:  XR CHEST PORT INDICATION:  Shortness of breath. COMPARISON:  No old study. FINDINGS:  An AP portable view was obtained of the chest.  An old left chest injury with metallic residuals, with left thoracotomy/rib deformities and with an old left pleural reaction are seen.  The right lung is clear.  The heart is within normal size.      IMPRESSION: Old left chest injury. Clear right lung          MEDICATIONS     ALL MEDICATIONS:   Current Facility-Administered Medications   Medication Dose Route Frequency   ??? QUEtiapine (SEROquel) tablet 50 mg  50 mg Oral QHS   ??? aspirin chewable tablet 81 mg  81 mg Oral DAILY   ??? amLODIPine (NORVASC) tablet 10 mg  10 mg Oral DAILY   ??? atenolol (TENORMIN) tablet 25 mg  25 mg Oral DAILY   ??? sertraline (ZOLOFT) tablet 25 mg  25 mg Oral DAILY   ??? hydrOXYzine pamoate (VISTARIL) capsule 25 mg  25 mg Oral TID PRN   ??? OLANZapine (ZyPREXA) tablet 2.5 mg  2.5 mg Oral Q6H PRN   ??? ziprasidone (GEODON) 10 mg in sterile water (preservative free) 0.5 mL injection  10 mg IntraMUSCular BID PRN   ??? benztropine (COGENTIN) tablet 1 mg  1 mg Oral BID PRN   ??? benztropine (COGENTIN) injection 1 mg  1 mg IntraMUSCular BID PRN   ??? zolpidem (AMBIEN) tablet 5 mg  5 mg Oral QHS PRN   ??? acetaminophen (TYLENOL) tablet 650 mg  650 mg Oral Q4H PRN   ??? ibuprofen (MOTRIN) tablet 400 mg  400 mg Oral Q8H PRN   ??? magnesium hydroxide (MILK OF MAGNESIA) 400 mg/5 mL oral suspension 30 mL  30 mL Oral DAILY PRN   ???  nicotine (NICODERM CQ) 21 mg/24 hr patch 1 Patch  1 Patch TransDERmal DAILY PRN      SCHEDULED MEDICATIONS:   Current Facility-Administered Medications   Medication Dose Route Frequency   ??? QUEtiapine (SEROquel) tablet 50 mg  50 mg Oral QHS   ??? aspirin chewable tablet 81 mg  81 mg Oral DAILY   ??? amLODIPine (NORVASC) tablet 10 mg  10 mg Oral DAILY    ??? atenolol (TENORMIN) tablet 25 mg  25 mg Oral DAILY   ??? sertraline (ZOLOFT) tablet 25 mg  25 mg Oral DAILY          ASSESSMENT & PLAN     DIAGNOSES REQUIRING ACTIVE TREATMENT AND MONITORING: (reviewed/updated 01/04/2017)  Patient Active Hospital Problem List:   Schizoaffective disorder (HCC) (12/30/2016)    Assessment: depression and irritability     Plan: continue same medications      2/10- labs reviewed, neurology eval. Ct with current med  2/11- ct with tx plan  01/04/17 increase Seroquel 50 mg po bed time          I will continue to monitor blood levels (Depakote, Tegretol, lithium, clozapine---a drug with a narrow therapeutic index= NTI) and associated labs for drug therapy implemented that require intense monitoring for toxicity as deemed appropriate based on current medication side effects and pharmacodynamically determined drug 1/2 lives.    In summary, Machael Raine, is a 71 y.o.  male who presents with a severe exacerbation of the principal diagnosis of Schizoaffective disorder (HCC)  Patient's condition is worsening/not improving/not stable improving.  Patient requires continued inpatient hospitalization for further stabilization, safety monitoring and medication management.  I will continue to coordinate the provision of individual, milieu, occupational, group, and substance abuse therapies to address target symptoms/diagnoses as deemed appropriate for the individual patient.  A coordinated, multidisplinary treatment team round was conducted with the patient (this team consists of the nurse, psychiatric unit pharmcist, Administrator).     Complete current electronic health record for patient has been reviewed today including consultant notes, ancillary staff notes, nurses and psychiatric tech notes.    Suicide risk assessment completed and patient deemed to be of low risk for suicide at this time.     The following regarding medications was addressed during rounds with patient:    the risks and benefits of the proposed medication. The patient was given the opportunity to ask questions. Informed consent given to the use of the above medications. Will continue to adjust psychiatric and non-psychiatric medications (see above "medication" section and orders section for details) as deemed appropriate & based upon diagnoses and response to treatment.     I will continue to order blood tests/labs and diagnostic tests as deemed appropriate and review results as they become available (see orders for details and above listed lab/test results).    I will order psychiatric records from previous psych hospitals to further elucidate the nature of patient's psychopathology and review once available.    I will gather additional collateral information from friends, family and o/p treatment team to further elucidate the nature of patient's psychopathology and baselline level of psychiatric functioning.         I certify that this patient's inpatient psychiatric hospital services furnished since the previous certification were, and continue to be, required for treatment that could reasonably be expected to improve the patient's condition, or for diagnostic study, and that the patient continues to need, on a daily basis, active treatment furnished directly  by or requiring the supervision of inpatient psychiatric facility personnel. In addition, the hospital records show that services furnished were intensive treatment services, admission or related services, or equivalent services.    EXPECTED DISCHARGE DATE/DAY: TBD     DISPOSITION: Home       Signed By:   [place], MD  01/04/2017

## 2017-01-05 MED ORDER — QUETIAPINE 25 MG TAB
25 mg | Freq: Every evening | ORAL | Status: DC
Start: 2017-01-05 — End: 2017-01-07
  Administered 2017-01-06 – 2017-01-07 (×2): via ORAL

## 2017-01-05 MED FILL — SERTRALINE 50 MG TAB: 50 mg | ORAL | Qty: 1

## 2017-01-05 MED FILL — AMLODIPINE 5 MG TAB: 5 mg | ORAL | Qty: 2

## 2017-01-05 MED FILL — SEROQUEL 25 MG TABLET: 25 mg | ORAL | Qty: 2

## 2017-01-05 MED FILL — CHILDREN'S ASPIRIN 81 MG CHEWABLE TABLET: 81 mg | ORAL | Qty: 1

## 2017-01-05 MED FILL — ATENOLOL 25 MG TAB: 25 mg | ORAL | Qty: 1

## 2017-01-05 NOTE — Progress Notes (Signed)
01/04/17 Call Behavioral Health to get patient, they will call me back.  01/05/17 Call Behavioral Health to get patient, nurse not available.

## 2017-01-05 NOTE — Behavioral Health Treatment Team (Cosign Needed)
COMMUNITY GROUP THERAPY PROGRESS NOTE    The patient Corey Crawford is participating in the Community Therapy Group.     Group time: 30 minutes    Personal goal for participation: To review unit guidelines and treatment plan    Goal orientation: personal    Group therapy participation: active    Therapeutic interventions reviewed and discussed: Yes    Impression of participation: Positive input.    Paulette Lanna PocheJ Allen  01/05/2017

## 2017-01-05 NOTE — Behavioral Health Treatment Team (Signed)
PSYCHIATRIC PROGRESS NOTE         Patient Name  Corey Crawford   Date of Birth 15-Jun-1946   CSN [id number]   Medical Record Number  [id number]      Age  71 y.o.   PCP Phys Other, MD   Admit date:  12/30/2016    Room Number  319/01  @ [facility]   Date of Service  01/05/2017          PSYCHOTHERAPY SESSION NOTE:  Length of psychotherapy session: 15 minutes    Main condition/diagnosis/issues treated during session today, 01/05/2017 : depression     I employed Cognitive Behavioral therapy techniques, Reality-Oriented psychotherapy, as well as supportive psychotherapy in regards to various ongoing psychosocial stressors, including the following: pre-admission and current problems; housing issues; occupational issues; academic issues; legal issues; medical issues; and stress of hospitalization. Interpersonal relationship issues and psychodynamic conflicts explored.  Attempts made to alleviate maladaptive patterns. We, also, worked on issues of denial & effects of substance dependency/use     Overall, patient is not progressing    Treatment Plan Update (reviewed an updated 01/05/2017) :  I will modify psychotherapy tx plan by implementing more stress management strategies, building upon cognitive behavioral techniques, increasing coping skills, as well as shoring up psychological defenses).    An extended energy and skill set was needed to engage pt in psychotherapy due to some of the following: resistiveness, complexity, negativity, confrontational nature, hostile behaviors, and/or severe abnormalities in thought processes/psychosis resulting in the loss of expressive/receptive language communication skills.                            E & M PROGRESS NOTE:         HISTORY       CC: ". Pt admitted under a voluntary basis for severe depression with suicidal ideations  proving to be an imminent danger to self and an inability to care for self.    HISTORY OF PRESENT ILLNESS:     The patient, Corey Crawford, is a 71 y.o.  WHITE OR CAUCASIAN male with a past psychiatric history significant for depression and self harm thoughts , who presents at this time with complaints of (and/or evidence of) the following emotional symptoms: depression, suicidal thoughts/threats and anxiety.  Additional symptomatology include anxiety, difficulty sleeping, feeling depressed, feeling suicidal and poor concentration.  The above symptoms have been present for months . These symptoms are of moderate severity. These symptoms are constant  in nature.  The patient's condition has been precipitated by death of his wife and psychosocial stressors (lives alone  ).  He denied drinkng or using any drugs and he has no guns at home   01/01/17 he is feeling down and he is compliant with medications and no anger issues and no self harm behavior and no acting out behavior and no paranoid feeling   2/10- anxious, labile, preoccupation and poor attention. Lying in bed and states I am going to die"  2/11- less anxious and lot calmer today. Remains preoccupied with health and believe "I am going to die" paranoid ideation+. Evaluated by Neurology  01/04/17 he is doing the same and he is depressed and anxious and stated he is in pain and can not walk well as his knees hurts and needs pain medications and he is not feeling angry or agitated and no aggressive behavior andno paranoid feeling but has lack of energy  01/05/17  he is feeling down and he is depressed and he feels tired and no Si or HI and no psychotic features and has no angre issues but feels not motivated and still not sleeping           SIDE EFFECTS: (reviewed/updated 01/05/2017)  None reported or admitted to.  No noted toxicity with use of Depakote/Tegretol/lithium/Clozaril/TCAs   ALLERGIES:(reviewed/updated 01/05/2017)  No Known Allergies   MEDICATIONS PRIOR TO ADMISSION:(reviewed/updated 01/05/2017)  Prescriptions Prior to Admission   Medication Sig    ??? amLODIPine (NORVASC) 10 mg tablet Take 1 Tab by mouth daily. Indications: HYPERTENSION   ??? atenolol (TENORMIN) 25 mg tablet Take 1 Tab by mouth daily. Indications: HYPERTENSION   ??? lithium carbonate 300 mg capsule Take 3 Caps by mouth nightly. Indications: BIPOLAR DISORDER   ??? divalproex ER (DEPAKOTE ER) 500 mg ER tablet Take 4 Tabs by mouth nightly. Indications: scizoaffective dis   ??? QUEtiapine SR (SEROQUEL XR) 300 mg sr tablet Take 2 Tabs by mouth every evening. Indications: BIPOLAR DISORDER, SCHIZOPHRENIA      PAST MEDICAL HISTORY: Past medical history from the initial psychiatric evaluation has been reviewed (reviewed/updated 01/05/2017) with no additional updates (I asked patient and no additional past medical history provided).   Past Medical History:   Diagnosis Date   ??? Depression    ??? Stroke [facility])    History reviewed. No pertinent surgical history.   SOCIAL HISTORY: Social history from the initial psychiatric evaluation has been reviewed (reviewed/updated 01/05/2017) with no additional updates (I asked patient and no additional social history provided).   Social History     Social History   ??? Marital status: LEGALLY SEPARATED     Spouse name: N/A   ??? Number of children: N/A   ??? Years of education: N/A     Occupational History   ??? Not on file.     Social History Main Topics   ??? Smoking status: Never Smoker   ??? Smokeless tobacco: Never Used   ??? Alcohol use No   ??? Drug use: No   ??? Sexual activity: No     Other Topics Concern   ??? Not on file     Social History Narrative    Lives in [place]. Lives by self. On SSDI. No job.      FAMILY HISTORY: Family history from the initial psychiatric evaluation has been reviewed (reviewed/updated 01/05/2017) with no additional updates (I asked patient and no additional family history provided).   Family History   Problem Relation Age of Onset   ??? Bipolar Disorder Father      committed suicide       REVIEW OF SYSTEMS: (reviewed/updated 01/05/2017)  Appetite:improved    Sleep: improved   All other Review of Systems: History obtained from the patient         MENTAL STATUS EXAM & VITALS     MENTAL STATUS EXAM (MSE):    MSE FINDINGS ARE WITHIN NORMAL LIMITS (WNL) UNLESS OTHERWISE STATED BELOW. ( ALL OF THE BELOW CATEGORIES OF THE MSE HAVE BEEN REVIEWED (reviewed 01/05/2017) AND UPDATED AS DEEMED APPROPRIATE )  General Presentation age appropriate, cooperative   Orientation oriented to time, place and person   Vital Signs  See below (reviewed 01/05/2017); Vital Signs (BP, Pulse, & Temp) are within normal limits if not listed below.   Gait and Station Stable/steady, no ataxia   Musculoskeletal System No extrapyramidal symptoms (EPS); no abnormal muscular movements or Tardive Dyskinesia (TD); muscle strength and tone  are within normal limits   Language No aphasia or dysarthria   Speech:  normal pitch and normal volume   Thought Processes logical; normal rate of thoughts; fair abstract reasoning/computation   Thought Associations goal directed   Thought Content free of delusions   Suicidal Ideations no plan    Homicidal Ideations no plan    Mood:  depressed   Affect:  depressed   Memory recent  fair   Memory remote:  fair   Concentration/Attention:  good   Fund of Knowledge average   Insight:  good   Reliability fair   Judgment:  fair          VITALS:     Patient Vitals for the past 24 hrs:   Temp Pulse Resp BP   01/05/17 0819 - 84 - -   01/05/17 0700 97.4 ??F (36.3 ??C) (!) 48 16 138/63     Wt Readings from Last 3 Encounters:   12/30/16 94.5 kg (208 lb 4.8 oz)   05/13/15 95.3 kg (210 lb)     Temp Readings from Last 3 Encounters:   01/05/17 97.4 ??F (36.3 ??C)   05/31/15 97.3 ??F (36.3 ??C)     BP Readings from Last 3 Encounters:   01/05/17 138/63   05/31/15 140/73     Pulse Readings from Last 3 Encounters:   01/05/17 84   05/31/15 66            DATA     LABORATORY DATA:(reviewed/updated 01/05/2017)  No results found for this or any previous visit (from the past 24 hour(s)).  Lab Results    Component Value Date/Time    Valproic acid 112 (H) 05/29/2015 05:12 AM     Lab Results   Component Value Date/Time    Lithium level 1.02 05/29/2015 05:12 AM      RADIOLOGY REPORTS:(reviewed/updated 01/05/2017)  Xr Chest Port    Result Date: 12/31/2016  EXAM:  XR CHEST PORT INDICATION:  Shortness of breath. COMPARISON:  No old study. FINDINGS:  An AP portable view was obtained of the chest.  An old left chest injury with metallic residuals, with left thoracotomy/rib deformities and with an old left pleural reaction are seen.  The right lung is clear.  The heart is within normal size.      IMPRESSION: Old left chest injury. Clear right lung          MEDICATIONS     ALL MEDICATIONS:   Current Facility-Administered Medications   Medication Dose Route Frequency   ??? QUEtiapine (SEROquel) tablet 75 mg  75 mg Oral QHS   ??? sertraline (ZOLOFT) tablet 50 mg  50 mg Oral DAILY   ??? aspirin chewable tablet 81 mg  81 mg Oral DAILY   ??? amLODIPine (NORVASC) tablet 10 mg  10 mg Oral DAILY   ??? atenolol (TENORMIN) tablet 25 mg  25 mg Oral DAILY   ??? hydrOXYzine pamoate (VISTARIL) capsule 25 mg  25 mg Oral TID PRN   ??? OLANZapine (ZyPREXA) tablet 2.5 mg  2.5 mg Oral Q6H PRN   ??? ziprasidone (GEODON) 10 mg in sterile water (preservative free) 0.5 mL injection  10 mg IntraMUSCular BID PRN   ??? benztropine (COGENTIN) tablet 1 mg  1 mg Oral BID PRN   ??? benztropine (COGENTIN) injection 1 mg  1 mg IntraMUSCular BID PRN   ??? zolpidem (AMBIEN) tablet 5 mg  5 mg Oral QHS PRN   ??? acetaminophen (TYLENOL) tablet 650 mg  650 mg  Oral Q4H PRN   ??? ibuprofen (MOTRIN) tablet 400 mg  400 mg Oral Q8H PRN   ??? magnesium hydroxide (MILK OF MAGNESIA) 400 mg/5 mL oral suspension 30 mL  30 mL Oral DAILY PRN   ??? nicotine (NICODERM CQ) 21 mg/24 hr patch 1 Patch  1 Patch TransDERmal DAILY PRN      SCHEDULED MEDICATIONS:   Current Facility-Administered Medications   Medication Dose Route Frequency   ??? QUEtiapine (SEROquel) tablet 75 mg  75 mg Oral QHS    ??? sertraline (ZOLOFT) tablet 50 mg  50 mg Oral DAILY   ??? aspirin chewable tablet 81 mg  81 mg Oral DAILY   ??? amLODIPine (NORVASC) tablet 10 mg  10 mg Oral DAILY   ??? atenolol (TENORMIN) tablet 25 mg  25 mg Oral DAILY          ASSESSMENT & PLAN     DIAGNOSES REQUIRING ACTIVE TREATMENT AND MONITORING: (reviewed/updated 01/05/2017)  Patient Active Hospital Problem List:   Schizoaffective disorder (HCC) (12/30/2016)    Assessment: depression and irritability     Plan: continue same medications      2/10- labs reviewed, neurology eval. Ct with current med  2/11- ct with tx plan  01/04/17 increase Seroquel 50 mg po bed time    01/05/17 increase Seroquel to 75 mg         I will continue to monitor blood levels (Depakote, Tegretol, lithium, clozapine---a drug with a narrow therapeutic index= NTI) and associated labs for drug therapy implemented that require intense monitoring for toxicity as deemed appropriate based on current medication side effects and pharmacodynamically determined drug 1/2 lives.    In summary, Corey Crawford, is a 72 y.o.  male who presents with a severe exacerbation of the principal diagnosis of Schizoaffective disorder (HCC)  Patient's condition is worsening/not improving/not stable improving.  Patient requires continued inpatient hospitalization for further stabilization, safety monitoring and medication management.  I will continue to coordinate the provision of individual, milieu, occupational, group, and substance abuse therapies to address target symptoms/diagnoses as deemed appropriate for the individual patient.  A coordinated, multidisplinary treatment team round was conducted with the patient (this team consists of the nurse, psychiatric unit pharmcist, Administrator).     Complete current electronic health record for patient has been reviewed today including consultant notes, ancillary staff notes, nurses and psychiatric tech notes.     Suicide risk assessment completed and patient deemed to be of low risk for suicide at this time.     The following regarding medications was addressed during rounds with patient:   the risks and benefits of the proposed medication. The patient was given the opportunity to ask questions. Informed consent given to the use of the above medications. Will continue to adjust psychiatric and non-psychiatric medications (see above "medication" section and orders section for details) as deemed appropriate & based upon diagnoses and response to treatment.     I will continue to order blood tests/labs and diagnostic tests as deemed appropriate and review results as they become available (see orders for details and above listed lab/test results).    I will order psychiatric records from previous psych hospitals to further elucidate the nature of patient's psychopathology and review once available.    I will gather additional collateral information from friends, family and o/p treatment team to further elucidate the nature of patient's psychopathology and baselline level of psychiatric functioning.         I certify  that this patient's inpatient psychiatric hospital services furnished since the previous certification were, and continue to be, required for treatment that could reasonably be expected to improve the patient's condition, or for diagnostic study, and that the patient continues to need, on a daily basis, active treatment furnished directly by or requiring the supervision of inpatient psychiatric facility personnel. In addition, the hospital records show that services furnished were intensive treatment services, admission or related services, or equivalent services.    EXPECTED DISCHARGE DATE/DAY: TBD     DISPOSITION: Home       Signed By:   [place], MD  01/05/2017

## 2017-01-05 NOTE — Behavioral Health Treatment Team (Cosign Needed)
REFLECTIONS GROUP THERAPY PROGRESS NOTE    The patient Corey Crawford is participating in Reflections Group Therapy.     Group time: 30 minutes    Personal goal for participation: Share with group about feelings and concerns throughout the course of the day.    Goal orientation: personal    Group therapy participation: active    Therapeutic interventions reviewed and discussed: Yes    Impression of participation:   Positive input.    Paulette Lanna PocheJ Allen  01/05/2017

## 2017-01-05 NOTE — Behavioral Health Treatment Team (Cosign Needed)
GROUP THERAPY PROGRESS NOTE    The patient Corey Crawford a 71 y.o. male is participating in Coping Skills Group.     Group time: 45 minutes    Personal goal for participation: To identify people and things most grateful for    Goal orientation:  personal    Group therapy participation: minimal    Therapeutic interventions reviewed and discussed: worksheet    Impression of participation:  The patient was present-arrived late    Audelia HivesBeverly S Baker  01/05/2017  2:01 PM

## 2017-01-05 NOTE — Behavioral Health Treatment Team (Signed)
The patient remains isolative to his room.The patient has not voiced any complaints or concerns.The patient has been encouraged to attend group.Q 15 minute safety checks.

## 2017-01-05 NOTE — Behavioral Health Treatment Team (Cosign Needed)
GROUP THERAPY PROGRESS NOTE    The patient Corey Crawford a 71 y.o. male is participating in Creative Expression Group.     Group time: 1 hour    Personal goal for participation: To concentrate on selected task    Goal orientation: social    Group therapy participation: minimal    Therapeutic interventions reviewed and discussed: Crafts, games, music    Impression of participation: The patient was present-elected to watch    Audelia HivesBeverly S Baker  01/05/2017  5:36 PM

## 2017-01-05 NOTE — Progress Notes (Signed)
Problem: Dementia-BEHAVIORAL HEALTH (Adult/Pediatric)  Goal: *STG: Remains safe in hospital  Pt is alert and oriented. Patient is passively suicidal but contracts for safety. Pt denies psychotic symptoms. . Pt is compliant with medication and meals. Pt attends groups and activities. Pt is self care with ADLS but asked male staff to help him wipe his butt. When asked why he needed help patient said he could do it himself. Continue to assess mood an behavior. Assist to reality test. Monitor on Q 15 checks.

## 2017-01-05 NOTE — Behavioral Health Treatment Team (Signed)
Pt observed resting comfortably in bed with eyes closed, breathing even and unlabored. No s/s of distress noted, no complaints voiced. Pt slept 7 hours, will continue to monitor for safety, Q15.

## 2017-01-06 ENCOUNTER — Inpatient Hospital Stay: Admit: 2017-01-06 | Payer: MEDICARE | Attending: Psychiatry | Primary: Family Medicine

## 2017-01-06 DIAGNOSIS — S0990XA Unspecified injury of head, initial encounter: Secondary | ICD-10-CM

## 2017-01-06 MED FILL — AMLODIPINE 5 MG TAB: 5 mg | ORAL | Qty: 2

## 2017-01-06 MED FILL — CHILDREN'S ASPIRIN 81 MG CHEWABLE TABLET: 81 mg | ORAL | Qty: 1

## 2017-01-06 MED FILL — ZOLPIDEM 5 MG TAB: 5 mg | ORAL | Qty: 1

## 2017-01-06 MED FILL — SEROQUEL 25 MG TABLET: 25 mg | ORAL | Qty: 3

## 2017-01-06 MED FILL — SERTRALINE 50 MG TAB: 50 mg | ORAL | Qty: 1

## 2017-01-06 MED FILL — ATENOLOL 25 MG TAB: 25 mg | ORAL | Qty: 1

## 2017-01-06 NOTE — Progress Notes (Signed)
Problem: Dementia-BEHAVIORAL HEALTH (Adult/Pediatric)  Goal: *STG: Remains safe in hospital  Pt isolating to his bed and perseverating  the entire shift despite encouragement from multiple staff. Patient endorses passive SI. Pt denies psychotic symptoms.  Pt is compliant with medication but refused supper and snack.  Pt is self care with ADLS. Continue to assess mood an behavior. Assist to reality test. Monitor on Q 15 checks.

## 2017-01-06 NOTE — Behavioral Health Treatment Team (Signed)
Pt observed resting comfortably in bed with eyes closed, breathing even and unlabored. No s/s of distress noted, no complaints voiced. Pt slept 5 hours, was observed laying in bed talking to himself for about an hour. Will continue to monitor for safety, Q15.

## 2017-01-06 NOTE — Behavioral Health Treatment Team (Cosign Needed)
Pt did not attend reflection this shift

## 2017-01-06 NOTE — Behavioral Health Treatment Team (Signed)
PSYCHIATRIC PROGRESS NOTE         Patient Name  Corey Crawford   Date of Birth 1946/06/02   CSN [id number]   Medical Record Number  [id number]      Age  71 y.o.   PCP Phys Other, MD   Admit date:  12/30/2016    Room Number  319/01  @ [facility]   Date of Service  01/06/2017          PSYCHOTHERAPY SESSION NOTE:  Length of psychotherapy session: 15 minutes    Main condition/diagnosis/issues treated during session today, 01/06/2017 : depression     I employed Cognitive Behavioral therapy techniques, Reality-Oriented psychotherapy, as well as supportive psychotherapy in regards to various ongoing psychosocial stressors, including the following: pre-admission and current problems; housing issues; occupational issues; academic issues; legal issues; medical issues; and stress of hospitalization. Interpersonal relationship issues and psychodynamic conflicts explored.  Attempts made to alleviate maladaptive patterns. We, also, worked on issues of denial & effects of substance dependency/use     Overall, patient is not progressing    Treatment Plan Update (reviewed an updated 01/06/2017) :  I will modify psychotherapy tx plan by implementing more stress management strategies, building upon cognitive behavioral techniques, increasing coping skills, as well as shoring up psychological defenses).    An extended energy and skill set was needed to engage pt in psychotherapy due to some of the following: resistiveness, complexity, negativity, confrontational nature, hostile behaviors, and/or severe abnormalities in thought processes/psychosis resulting in the loss of expressive/receptive language communication skills.                            E & M PROGRESS NOTE:         HISTORY       CC: ". Pt admitted under a voluntary basis for severe depression with suicidal ideations  proving to be an imminent danger to self and an inability to care for self.    HISTORY OF PRESENT ILLNESS:     The patient, Corey Crawford, is a 71 y.o.  WHITE OR CAUCASIAN male with a past psychiatric history significant for depression and self harm thoughts , who presents at this time with complaints of (and/or evidence of) the following emotional symptoms: depression, suicidal thoughts/threats and anxiety.  Additional symptomatology include anxiety, difficulty sleeping, feeling depressed, feeling suicidal and poor concentration.  The above symptoms have been present for months . These symptoms are of moderate severity. These symptoms are constant  in nature.  The patient's condition has been precipitated by death of his wife and psychosocial stressors (lives alone  ).  He denied drinkng or using any drugs and he has no guns at home   01/01/17 he is feeling down and he is compliant with medications and no anger issues and no self harm behavior and no acting out behavior and no paranoid feeling   2/10- anxious, labile, preoccupation and poor attention. Lying in bed and states I am going to die"  2/11- less anxious and lot calmer today. Remains preoccupied with health and believe "I am going to die" paranoid ideation+. Evaluated by Neurology  01/04/17 he is doing the same and he is depressed and anxious and stated he is in pain and can not walk well as his knees hurts and needs pain medications and he is not feeling angry or agitated and no aggressive behavior andno paranoid feeling but has lack of energy  01/05/17  he is feeling down and he is depressed and he feels tired and no Si or HI and no psychotic features and has no angre issues but feels not motivated and still not sleeping   2/140- inconsistent sx presentation- stated he cannot walk or eat but later he was able to ambulate. Somatic c/o and preoccupied. Anxiety, likes to stay in bed and not participate in groups          SIDE EFFECTS: (reviewed/updated 01/06/2017)  None reported or admitted to.  No noted toxicity with use of Depakote/Tegretol/lithium/Clozaril/TCAs    ALLERGIES:(reviewed/updated 01/06/2017)  No Known Allergies   MEDICATIONS PRIOR TO ADMISSION:(reviewed/updated 01/06/2017)  Prescriptions Prior to Admission   Medication Sig   ??? amLODIPine (NORVASC) 10 mg tablet Take 1 Tab by mouth daily. Indications: HYPERTENSION   ??? atenolol (TENORMIN) 25 mg tablet Take 1 Tab by mouth daily. Indications: HYPERTENSION   ??? lithium carbonate 300 mg capsule Take 3 Caps by mouth nightly. Indications: BIPOLAR DISORDER   ??? divalproex ER (DEPAKOTE ER) 500 mg ER tablet Take 4 Tabs by mouth nightly. Indications: scizoaffective dis   ??? QUEtiapine SR (SEROQUEL XR) 300 mg sr tablet Take 2 Tabs by mouth every evening. Indications: BIPOLAR DISORDER, SCHIZOPHRENIA      PAST MEDICAL HISTORY: Past medical history from the initial psychiatric evaluation has been reviewed (reviewed/updated 01/06/2017) with no additional updates (I asked patient and no additional past medical history provided).   Past Medical History:   Diagnosis Date   ??? Depression    ??? Stroke [facility])    History reviewed. No pertinent surgical history.   SOCIAL HISTORY: Social history from the initial psychiatric evaluation has been reviewed (reviewed/updated 01/06/2017) with no additional updates (I asked patient and no additional social history provided).   Social History     Social History   ??? Marital status: LEGALLY SEPARATED     Spouse name: N/A   ??? Number of children: N/A   ??? Years of education: N/A     Occupational History   ??? Not on file.     Social History Main Topics   ??? Smoking status: Never Smoker   ??? Smokeless tobacco: Never Used   ??? Alcohol use No   ??? Drug use: No   ??? Sexual activity: No     Other Topics Concern   ??? Not on file     Social History Narrative    Lives in [place]. Lives by self. On SSDI. No job.      FAMILY HISTORY: Family history from the initial psychiatric evaluation has been reviewed (reviewed/updated 01/06/2017) with no additional updates (I asked patient and no additional family history provided).    Family History   Problem Relation Age of Onset   ??? Bipolar Disorder Father      committed suicide       REVIEW OF SYSTEMS: (reviewed/updated 01/06/2017)  Appetite:improved   Sleep: improved   All other Review of Systems: History obtained from the patient         MENTAL STATUS EXAM & VITALS     MENTAL STATUS EXAM (MSE):    MSE FINDINGS ARE WITHIN NORMAL LIMITS (WNL) UNLESS OTHERWISE STATED BELOW. ( ALL OF THE BELOW CATEGORIES OF THE MSE HAVE BEEN REVIEWED (reviewed 01/06/2017) AND UPDATED AS DEEMED APPROPRIATE )  General Presentation age appropriate, cooperative   Orientation oriented to time, place and person   Vital Signs  See below (reviewed 01/06/2017); Vital Signs (BP, Pulse, & Temp) are within normal  limits if not listed below.   Gait and Station Stable/steady, no ataxia   Musculoskeletal System No extrapyramidal symptoms (EPS); no abnormal muscular movements or Tardive Dyskinesia (TD); muscle strength and tone are within normal limits   Language No aphasia or dysarthria   Speech:  normal pitch and normal volume   Thought Processes logical; normal rate of thoughts; fair abstract reasoning/computation   Thought Associations goal directed   Thought Content free of delusions   Suicidal Ideations no plan    Homicidal Ideations no plan    Mood:  depressed   Affect:  depressed   Memory recent  fair   Memory remote:  fair   Concentration/Attention:  good   Fund of Knowledge average   Insight:  good   Reliability fair   Judgment:  fair          VITALS:     Patient Vitals for the past 24 hrs:   Temp Pulse Resp BP   01/06/17 0700 98.4 ??F (36.9 ??C) (!) 48 16 122/44   01/05/17 2103 - (!) 56 - 129/61     Wt Readings from Last 3 Encounters:   12/30/16 94.5 kg (208 lb 4.8 oz)   05/13/15 95.3 kg (210 lb)     Temp Readings from Last 3 Encounters:   01/06/17 98.4 ??F (36.9 ??C)   05/31/15 97.3 ??F (36.3 ??C)     BP Readings from Last 3 Encounters:   01/06/17 122/44   05/31/15 140/73     Pulse Readings from Last 3 Encounters:    01/06/17 (!) 48   05/31/15 66            DATA     LABORATORY DATA:(reviewed/updated 01/06/2017)  No results found for this or any previous visit (from the past 24 hour(s)).  Lab Results   Component Value Date/Time    Valproic acid 112 (H) 05/29/2015 05:12 AM     Lab Results   Component Value Date/Time    Lithium level 1.02 05/29/2015 05:12 AM      RADIOLOGY REPORTS:(reviewed/updated 01/06/2017)  Xr Chest Port    Result Date: 12/31/2016  EXAM:  XR CHEST PORT INDICATION:  Shortness of breath. COMPARISON:  No old study. FINDINGS:  An AP portable view was obtained of the chest.  An old left chest injury with metallic residuals, with left thoracotomy/rib deformities and with an old left pleural reaction are seen.  The right lung is clear.  The heart is within normal size.      IMPRESSION: Old left chest injury. Clear right lung          MEDICATIONS     ALL MEDICATIONS:   Current Facility-Administered Medications   Medication Dose Route Frequency   ??? QUEtiapine (SEROquel) tablet 75 mg  75 mg Oral QHS   ??? sertraline (ZOLOFT) tablet 50 mg  50 mg Oral DAILY   ??? aspirin chewable tablet 81 mg  81 mg Oral DAILY   ??? amLODIPine (NORVASC) tablet 10 mg  10 mg Oral DAILY   ??? atenolol (TENORMIN) tablet 25 mg  25 mg Oral DAILY   ??? hydrOXYzine pamoate (VISTARIL) capsule 25 mg  25 mg Oral TID PRN   ??? OLANZapine (ZyPREXA) tablet 2.5 mg  2.5 mg Oral Q6H PRN   ??? ziprasidone (GEODON) 10 mg in sterile water (preservative free) 0.5 mL injection  10 mg IntraMUSCular BID PRN   ??? benztropine (COGENTIN) tablet 1 mg  1 mg Oral BID PRN   ??? benztropine (  COGENTIN) injection 1 mg  1 mg IntraMUSCular BID PRN   ??? zolpidem (AMBIEN) tablet 5 mg  5 mg Oral QHS PRN   ??? acetaminophen (TYLENOL) tablet 650 mg  650 mg Oral Q4H PRN   ??? ibuprofen (MOTRIN) tablet 400 mg  400 mg Oral Q8H PRN   ??? magnesium hydroxide (MILK OF MAGNESIA) 400 mg/5 mL oral suspension 30 mL  30 mL Oral DAILY PRN   ??? nicotine (NICODERM CQ) 21 mg/24 hr patch 1 Patch  1 Patch TransDERmal  DAILY PRN      SCHEDULED MEDICATIONS:   Current Facility-Administered Medications   Medication Dose Route Frequency   ??? QUEtiapine (SEROquel) tablet 75 mg  75 mg Oral QHS   ??? sertraline (ZOLOFT) tablet 50 mg  50 mg Oral DAILY   ??? aspirin chewable tablet 81 mg  81 mg Oral DAILY   ??? amLODIPine (NORVASC) tablet 10 mg  10 mg Oral DAILY   ??? atenolol (TENORMIN) tablet 25 mg  25 mg Oral DAILY          ASSESSMENT & PLAN     DIAGNOSES REQUIRING ACTIVE TREATMENT AND MONITORING: (reviewed/updated 01/06/2017)  Patient Active Hospital Problem List:   Schizoaffective disorder (HCC) (12/30/2016)    Assessment: depression and irritability     Plan: continue same medications      2/10- labs reviewed, neurology eval. Ct with current med  2/11- ct with tx plan  01/04/17 increase Seroquel 50 mg po bed time    01/05/17 increase Seroquel to 75 mg   2/14- ct with tx, no neurology notes even though pt has EEG/evaluation.      In summary, Corey Crawford, is a 71 y.o.  male who presents with a severe exacerbation of the principal diagnosis of Schizoaffective disorder (HCC)  Patient's condition is not stable .  Patient requires continued inpatient hospitalization for further stabilization, safety monitoring and medication management.  I will continue to coordinate the provision of individual, milieu, occupational, group, and substance abuse therapies to address target symptoms/diagnoses as deemed appropriate for the individual patient.  A coordinated, multidisplinary treatment team round was conducted with the patient (this team consists of the nurse, psychiatric unit pharmcist, Administrator).     Complete current electronic health record for patient has been reviewed today including consultant notes, ancillary staff notes, nurses and psychiatric tech notes.    Suicide risk assessment completed and patient deemed to be of low risk for suicide at this time.     The following regarding medications was addressed during rounds with  patient:   the risks and benefits of the proposed medication. The patient was given the opportunity to ask questions. Informed consent given to the use of the above medications. Will continue to adjust psychiatric and non-psychiatric medications (see above "medication" section and orders section for details) as deemed appropriate & based upon diagnoses and response to treatment.     I will continue to order blood tests/labs and diagnostic tests as deemed appropriate and review results as they become available (see orders for details and above listed lab/test results).    I will order psychiatric records from previous psych hospitals to further elucidate the nature of patient's psychopathology and review once available.    I will gather additional collateral information from friends, family and o/p treatment team to further elucidate the nature of patient's psychopathology and baselline level of psychiatric functioning.         I certify that this patient's inpatient  psychiatric hospital services furnished since the previous certification were, and continue to be, required for treatment that could reasonably be expected to improve the patient's condition, or for diagnostic study, and that the patient continues to need, on a daily basis, active treatment furnished directly by or requiring the supervision of inpatient psychiatric facility personnel. In addition, the hospital records show that services furnished were intensive treatment services, admission or related services, or equivalent services.    EXPECTED DISCHARGE DATE/DAY: Thursday     DISPOSITION: Home       Signed By:   Onnie Graham, MD  01/06/2017

## 2017-01-06 NOTE — Behavioral Health Treatment Team (Signed)
Dr. Roger KillAralu's office was called to leave message Re: patient's imminent discharge on tomorrow.

## 2017-01-06 NOTE — Behavioral Health Treatment Team (Cosign Needed)
Pt visible on the unit, complaint with breakfast and meds,  Pt refused lunch, continue to lay in bed and isolate this shift.  Pt offered no self disclosures, denies S/I H/I A/H, affect flat and sad, depressed mood.  Pt remain on Q 15 min checks for safety, will monitor and offer support daily.

## 2017-01-06 NOTE — Behavioral Health Treatment Team (Cosign Needed)
Pt did not attend coping skills group.

## 2017-01-06 NOTE — Behavioral Health Treatment Team (Cosign Needed)
Pt did not attend creative expression group.

## 2017-01-06 NOTE — Progress Notes (Signed)
Mr. Corey Crawford was present in [facility] about [facility]    Chaplain Alinda SierrasKristen Taylor, M.Div.  Chaplain Paging Service [phone]

## 2017-01-07 MED ORDER — ATENOLOL 25 MG TAB
25 mg | ORAL_TABLET | Freq: Every day | ORAL | 0 refills | Status: AC
Start: 2017-01-07 — End: 2017-01-22

## 2017-01-07 MED ORDER — QUETIAPINE 25 MG TAB
25 mg | ORAL_TABLET | Freq: Every evening | ORAL | 0 refills | Status: AC
Start: 2017-01-07 — End: 2017-01-21

## 2017-01-07 MED ORDER — AMLODIPINE 10 MG TAB
10 mg | ORAL_TABLET | Freq: Every day | ORAL | 0 refills | Status: AC
Start: 2017-01-07 — End: 2017-01-22

## 2017-01-07 MED ORDER — ASPIRIN 81 MG CHEWABLE TAB
81 mg | ORAL_TABLET | Freq: Every day | ORAL | 0 refills | Status: AC
Start: 2017-01-07 — End: 2017-01-22

## 2017-01-07 MED ORDER — SERTRALINE 50 MG TAB
50 mg | ORAL_TABLET | Freq: Every day | ORAL | 0 refills | Status: AC
Start: 2017-01-07 — End: 2017-01-22

## 2017-01-07 MED FILL — SERTRALINE 50 MG TAB: 50 mg | ORAL | Qty: 1

## 2017-01-07 MED FILL — SEROQUEL 25 MG TABLET: 25 mg | ORAL | Qty: 3

## 2017-01-07 MED FILL — AMLODIPINE 5 MG TAB: 5 mg | ORAL | Qty: 2

## 2017-01-07 MED FILL — HYDROXYZINE PAMOATE 25 MG CAP: 25 mg | ORAL | Qty: 1

## 2017-01-07 MED FILL — ATENOLOL 25 MG TAB: 25 mg | ORAL | Qty: 1

## 2017-01-07 MED FILL — CHILDREN'S ASPIRIN 81 MG CHEWABLE TABLET: 81 mg | ORAL | Qty: 1

## 2017-01-07 NOTE — Discharge Summary (Signed)
PSYCHIATRIC DISCHARGE SUMMARY         IDENTIFICATION:    Patient Name  Corey Crawford   Date of Birth 12-04-1945   CSN [id number]   Medical Record Number  [id number]      Age  71 y.o.   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  WHITE OR CAUCASIAN male with a past psychiatric history significant for depression and self harm thoughts , who presents at this time with complaints of (and/or evidence of) the following emotional symptoms: depression, suicidal thoughts/threats and anxiety.  Additional symptomatology include anxiety, difficulty sleeping, feeling depressed, feeling suicidal and poor concentration.  The above symptoms have been present for months . These symptoms are of moderate severity. These symptoms are constant  in nature.  The patient's condition has been precipitated by death of his wife and psychosocial stressors (lives alone  ).  He denied drinkng or using any drugs and he has no guns at home         SOCIAL HISTORY:    Social History     Social History   ??? Marital status: LEGALLY SEPARATED     Spouse name: N/A   ??? Number of children: N/A   ??? Years of education: N/A     Occupational History   ??? Not on file.     Social History Main Topics   ??? Smoking status: Never Smoker   ??? Smokeless tobacco: Never Used   ??? Alcohol use No   ??? Drug use: No   ??? Sexual activity: No     Other Topics Concern   ??? Not on file     Social History Narrative    Lives in [place]. Lives by self. On SSDI. No job.      FAMILY HISTORY:   Family History   Problem Relation Age of Onset   ??? Bipolar Disorder Father      committed  suicide             HOSPITALIZATION COURSE:    Corey Crawford was admitted to the inpatient psychiatric unit [facility] for acute psychiatric stabilization in regards to symptomatology as described in the HPI above. The differential diagnosis at time of admission included: bipolar dis vs. schizoaffective vs. Schizophrenia.  While on the unit Corey Crawford was involved in individual, group, occupational and milieu therapy.  Psychiatric medications were adjusted during this hospitalization including Seroquel.   [facility] demonstrated a slow, but progressive improvement in overall condition.  Much of patient's depression appeared to be related to situational stressors,  and psychological factors.  Please see individual progress notes for more specific details regarding patient's hospitalization course.   At time of discharge, Corey Crawford is without significant problems of depression psychosis  mania. Patient free of suicidal and homicidal ideations (appears to be at very low risk of suicide or homicide) and reports many positive predictive factors in terms of not attempting suicide or homicide. Overall presentation at time of discharge is most consistent with the diagnosis of schizoaffective d/o.Patient with request for discharge today. There are no grounds to seek a TDO. Patient has maximized benefit  to be derived from acute inpatient psychiatric treatment.  All members of the treatment team concur with each other in regards to plans for discharge today per patient's request.  Patient and family are aware and in agreement with discharge and discharge plan.    Per my last note:   Schizoaffective disorder ([place]) (12/30/2016)    Assessment: depression and irritability     Plan: continue same medications      2/10- labs reviewed, neurology eval. Ct with current med  2/11- ct with tx plan  01/04/17 increase Seroquel 50 mg po bed time    01/05/17 increase Seroquel to 75 mg   2/14- ct with tx, no neurology notes even though pt has EEG/evaluation.       LABS AND IMAGAING:    Labs Reviewed   LIPID PANEL - Abnormal; Notable for the following:        Result Value    LDL, calculated 115.2 (*)     All other components within normal limits   GLUCOSE, FASTING - Abnormal; Notable for the following:     Glucose 118 (*)     All other components within normal limits   HEPATIC FUNCTION PANEL - Abnormal; Notable for the following:     Albumin 3.2 (*)     A-G Ratio 1.0 (*)     AST (SGOT) 14 (*)     All other components within normal limits   SED RATE (ESR) - Abnormal; Notable for the following:     Sed rate, automated 107 (*)     All other components within normal limits   LIPID PANEL - Abnormal; Notable for the following:     LDL, calculated 121.6 (*)     All other components within normal limits   TSH 3RD GENERATION   CK   HOMOCYSTEINE, PLASMA   RHEUMATOID FACTOR, QT   ANA BY MULTIPLEX FLOW IA, QL   RPR   VITAMIN B12   CEA   GLUCOSE, POC     Lab Results   Component Value Date/Time    Valproic acid 112 (H) 05/29/2015 05:12 AM     Admission on 12/30/2016   Component Date Value Ref Range Status   ??? TSH 12/31/2016 0.72  0.36 - 3.74 uIU/mL Final   ???  LIPID PROFILE 12/31/2016        Final   ??? Cholesterol, total 12/31/2016 176  <200 MG/DL Final   ??? Triglyceride 12/31/2016 69  <150 MG/DL Final   ??? HDL Cholesterol 12/31/2016 47  MG/DL Final   ???  LDL, calculated 12/31/2016 115.2* 0 - 100 MG/DL Final   ??? VLDL, calculated 12/31/2016 13.8  MG/DL Final   ??? CHOL/HDL Ratio 12/31/2016 3.7  0 - 5.0   Final   ??? Glucose 12/31/2016 118* 65 - 100 MG/DL Final   ??? Ventricular Rate 12/31/2016 51  BPM Final   ??? Atrial Rate 12/31/2016 51  BPM Final   ??? P-R Interval 12/31/2016 144  ms Final   ??? QRS Duration 12/31/2016 92  ms Final   ??? Q-T Interval 12/31/2016 450  ms Final   ??? QTC Calculation (Bezet) 12/31/2016 414  ms Final   ??? Calculated P Axis 12/31/2016 26  degrees Final   ??? Calculated R Axis 12/31/2016 10  degrees Final   ??? Calculated T Axis 12/31/2016 55  degrees Final   ??? Diagnosis 12/31/2016    Final                    Value:Sinus bradycardia  Incomplete right bundle branch block  No previous ECGs available  Confirmed by Baird Cancer [phone]) on 01/03/2017 12:37:28 PM     ??? Glucose (POC) 12/31/2016 82  65 - 100 mg/dL Final   ??? Performed by 12/31/2016 [facility]   Final   ??? Protein, total 01/01/2017 6.5  6.4 - 8.2 g/dL Final   ??? Albumin 01/01/2017 3.2* 3.5 - 5.0 g/dL Final   ??? Globulin 01/01/2017 3.3  2.0 - 4.0 g/dL Final   ??? A-G Ratio 01/01/2017 1.0* 1.1 - 2.2   Final   ??? Bilirubin, total 01/01/2017 0.5  0.2 - 1.0 MG/DL Final   ??? Bilirubin, direct 01/01/2017 0.1  0.0 - 0.2 MG/DL Final   ??? Alk. phosphatase 01/01/2017 66  45 - 117 U/L Final   ??? AST (SGOT) 01/01/2017 14* 15 - 37 U/L Final   ??? ALT (SGPT) 01/01/2017 22  12 - 78 U/L Final   ??? CK 01/01/2017 42  39 - 308 U/L Final   ??? Sed rate, automated 01/01/2017 107* 0 - 20 mm/hr Final   ??? Homocysteine, plasma 01/01/2017 10.9  3.7 - 13.9 umol/L Final   ??? Rheumatoid factor 01/01/2017 <10  <15 IU/mL Final   ??? ANA, Direct 01/01/2017 NEGATIVE   NEGATIVE   Final   ??? RPR 01/02/2017 NONREACTIVE  NR   Final   ??? Vitamin B12 01/02/2017 392  211 - 911 pg/mL Final   ??? CEA 01/02/2017 1.3  ng/mL Final   ??? LIPID PROFILE 01/02/2017        Final   ??? Cholesterol, total 01/02/2017 185  <200 MG/DL Final   ??? Triglyceride 01/02/2017 87   <150 MG/DL Final   ??? HDL Cholesterol 01/02/2017 46  MG/DL Final   ??? LDL, calculated 01/02/2017 121.6* 0 - 100 MG/DL Final   ??? VLDL, calculated 01/02/2017 17.4  MG/DL Final   ??? CHOL/HDL Ratio 01/02/2017 4.0  0 - 5.0   Final     Ct Head Wo Cont    Result Date: 01/06/2017  EXAM:  CT HEAD WO CONT Medical history: Trauma INDICATION:   History of Trauma COMPARISON: None. CONTRAST:  None. TECHNIQUE: Unenhanced CT of the head was performed using 5 mm images. Brain and  bone windows were generated.  CT dose reduction was achieved through use of a standardized protocol tailored for this examination and automatic exposure control for dose modulation.  FINDINGS: The ventricles and sulci are normal in size, shape and configuration and midline. There is no significant white matter disease. There is no intracranial hemorrhage, extra-axial collection, mass, mass effect or midline shift.  The basilar cisterns are open. No acute infarct is identified. The bone windows demonstrate no abnormalities. The visualized portions of the paranasal sinuses and mastoid air cells are clear.     IMPRESSION: No acute intracranial process     Xr Chest Port    Result Date: 12/31/2016  EXAM:  XR CHEST PORT INDICATION:  Shortness of breath. COMPARISON:  No old study. FINDINGS:  An AP portable view was obtained of the chest.  An old left chest injury with metallic residuals, with left thoracotomy/rib deformities and with an old left pleural reaction are seen.  The right lung is clear.  The heart is within normal size.      IMPRESSION: Old left chest injury. Clear right lung                   DISPOSITION:    Home. Patient to f/u with psychiatric, and psychotherapy appointments. Patient is to f/u with internist as directed.                 FOLLOW-UP CARE:    Activity as tolerated  Low fat, Low cholesterol  Wound Care: none needed.  Follow-up Information     Follow up With Details Comments [place] on 01/12/2017 Please  attend new patient appointment on Tuesday, February 20th at 12:00PM.  Lynford Humphrey  [address] [place] [address]  Phone:[phone]  FAX: [place]  Nurse and social worker will come to your home on Saturday, February 17th.  They will call you with scheduled time.  Address: [place] [address]  Hours: Open 24 hours  Phone: [phone]  Appointments: [email or web address]    Phys Other, MD   Patient can only remember the practice name and not the physician                   PROGNOSIS:   / Poor---- based on nature of patient's pathology/ies and treatment compliance issues.  Prognosis is greatly dependent upon patient's ability to follow up with psychiatric/psychotherapy appointments as well as to comply with psychiatric medications as prescribed.            DISCHARGE MEDICATIONS: Informed consent given for the use of following psychotropic medications:  Current Discharge Medication List      START taking these medications    Details   aspirin 81 mg chewable tablet Take 1 Tab by mouth daily for 14 days. Indications: myocardial infarction prevention  Qty: 14 Tab, Refills: 0      QUEtiapine (SEROQUEL) 25 mg tablet Take 3 Tabs by mouth nightly for 14 days. Indications: DEPRESSION TREATMENT ADJUNCT  Qty: 42 Tab, Refills: 0      sertraline (ZOLOFT) 50 mg tablet Take 1 Tab by mouth daily for 14 days. Indications: major depressive disorder  Qty: 14 Tab, Refills: 0         CONTINUE these medications which have CHANGED    Details   !! amLODIPine (NORVASC) 10 mg tablet Take 1 Tab by mouth daily for 14 days. Indications: hypertension  Qty: 14 Tab, Refills: 0      !! atenolol (TENORMIN) 25 mg tablet Take 1 Tab by mouth daily for 14 days. Indications: hypertension  Qty: 14 Tab, Refills: 0       !! - Potential duplicate medications found. Please discuss with provider.      CONTINUE these medications which have NOT CHANGED    Details   !! amLODIPine (NORVASC) 10 mg tablet Take 1  Tab by mouth daily. Indications: HYPERTENSION  Qty: 14 Tab, Refills: 1      !! atenolol (TENORMIN) 25 mg tablet Take 1 Tab by mouth daily. Indications: HYPERTENSION  Qty: 14 Tab, Refills: 1       !! - Potential duplicate medications found. Please discuss with provider.      STOP taking these medications       lithium carbonate 300 mg capsule Comments:   Reason for Stopping:         divalproex ER (DEPAKOTE ER) 500 mg ER tablet Comments:   Reason for Stopping:         QUEtiapine SR (SEROQUEL XR) 300 mg sr tablet Comments:   Reason for Stopping:                      A coordinated, multidisplinary treatment team round was conducted with Arna Snipe is done daily here at [facility]. This team consists of the nurse, psychiatric unit pharmcist, Education officer, museum and Probation officer.     I have spent greater than 35 minutes on discharge work.    Signed:  Arlice Colt, MD  01/07/2017

## 2017-01-07 NOTE — Behavioral Health Treatment Team (Signed)
Pt is alert and oriented x person, place and time. Pt denies any SI/HI or AV hallucinations. Pt denies any depression. Pt plans to return home. Discharge information reviewed with patient. Pt verbalizes understanding. Pt's belongings/valuables returned. Pt to be transported home by bus.

## 2017-01-07 NOTE — Behavioral Health Treatment Team (Signed)
Pt observed resting comfortably in bed with eyes closed, breathing even and unlabored. No s/s of distress noted, no complaints voiced. Pt slept 5.5 hours due to fire drill and roommate turning room light on, will continue to monitor for safety, Q15.

## 2017-01-07 NOTE — Behavioral Health Treatment Team (Signed)
Behavioral Health Transition Record to Provider    Patient Name: Corey Crawford  Date of Birth: 1946/01/27  Medical Record Number: [id number]  Date of Admission: 12/30/2016  Date of Discharge: 01/07/2017    Attending Provider: Randa Spike, MD  Discharging Provider: Arlice Colt, MD  To contact this individual call [phone] and ask the operator to page.  If unavailable, ask to be transferred to [facility] Provider on call.  [facility] Provider will be available on call 24/7 and during holidays.    Primary Care Provider: Phys Other, MD    No Known Allergies    Reason for Admission: Pt stated he was suicidal and would rather die that go on living in the pain he is in.  Pt states that he is lonely.  Per sister's report, patient asked [facility] staff for a shot that would end his life.  Sister reports patient has been off medication since November 2017.  Pt was in hospitalized at [facility] in [place] from November 10th to December 10th.   And pt has remained inside of his house since December 10th refusing to leave to get groceries, medication etc.     Admission Diagnosis: schizoaffective  Schizoaffective disorder ([place])  Depression    * No surgery found *    Results for orders placed or performed during the hospital encounter of 12/30/16   TSH 3RD GENERATION   Result Value Ref Range    TSH 0.72 0.36 - 3.74 uIU/mL   LIPID PANEL   Result Value Ref Range    LIPID PROFILE          Cholesterol, total 176 <200 MG/DL    Triglyceride 69 <150 MG/DL    HDL Cholesterol 47 MG/DL    LDL, calculated 115.2 (H) 0 - 100 MG/DL    VLDL, calculated 13.8 MG/DL    CHOL/HDL Ratio 3.7 0 - 5.0     GLUCOSE, FASTING   Result Value Ref Range    Glucose 118 (H) 65 - 100 MG/DL   HEPATIC FUNCTION PANEL   Result Value Ref Range    Protein, total 6.5 6.4 - 8.2 g/dL    Albumin 3.2 (L) 3.5 - 5.0 g/dL    Globulin 3.3 2.0 - 4.0 g/dL    A-G Ratio 1.0 (L) 1.1 - 2.2      Bilirubin, total 0.5 0.2 - 1.0 MG/DL     Bilirubin, direct 0.1 0.0 - 0.2 MG/DL    Alk. phosphatase 66 45 - 117 U/L    AST (SGOT) 14 (L) 15 - 37 U/L    ALT (SGPT) 22 12 - 78 U/L   CK   Result Value Ref Range    CK 42 39 - 308 U/L   SED RATE (ESR)   Result Value Ref Range    Sed rate, automated 107 (H) 0 - 20 mm/hr   HOMOCYSTEINE, PLASMA   Result Value Ref Range    Homocysteine, plasma 10.9 3.7 - 13.9 umol/L   RHEUMATOID FACTOR, QT   Result Value Ref Range    Rheumatoid factor <10 <15 IU/mL   ANA BY MULTIPLEX FLOW IA, QL   Result Value Ref Range    ANA, Direct NEGATIVE  NEGATIVE     RPR   Result Value Ref Range    RPR NONREACTIVE NR     VITAMIN B12   Result Value Ref Range    Vitamin B12 392 211 - 911 pg/mL   CEA   Result  Value Ref Range    CEA 1.3 ng/mL   LIPID PANEL   Result Value Ref Range    LIPID PROFILE          Cholesterol, total 185 <200 MG/DL    Triglyceride 87 <150 MG/DL    HDL Cholesterol 46 MG/DL    LDL, calculated 121.6 (H) 0 - 100 MG/DL    VLDL, calculated 17.4 MG/DL    CHOL/HDL Ratio 4.0 0 - 5.0     GLUCOSE, POC   Result Value Ref Range    Glucose (POC) 82 65 - 100 mg/dL    Performed by [facility]    EKG, 12 LEAD, INITIAL   Result Value Ref Range    Ventricular Rate 51 BPM    Atrial Rate 51 BPM    P-R Interval 144 ms    QRS Duration 92 ms    Q-T Interval 450 ms    QTC Calculation (Bezet) 414 ms    Calculated P Axis 26 degrees    Calculated R Axis 10 degrees    Calculated T Axis 55 degrees    Diagnosis       Sinus bradycardia  Incomplete right bundle branch block  No previous ECGs available  Confirmed by Baird Cancer [phone]) on 01/03/2017 12:37:28 PM         Immunizations administered during this encounter:   There is no immunization history on file for this patient.    Screening for Metabolic Disorders for Patients on Antipsychotic Medications  (Data obtained from the EMR)    Estimated Body Mass Index  Estimated body mass index is 31.67 kg/(m^2) as calculated from the following:    Height as of this encounter: 5' 8"  (1.727 m).     Weight as of this encounter: 94.5 kg (208 lb 4.8 oz).     Vital Signs/Blood Pressure  Visit Vitals   ??? BP 120/55   ??? Pulse (!) 43   ??? Temp 98.3 ??F (36.8 ??C)   ??? Resp 16   ??? Ht 5' 8"  (1.727 m)   ??? Wt 94.5 kg (208 lb 4.8 oz)   ??? SpO2 99%   ??? BMI 31.67 kg/m2       Blood Glucose/Hemoglobin A1c  Lab Results   Component Value Date/Time    Glucose 118 (H) 12/31/2016 04:55 AM    Glucose (POC) 82 12/31/2016 12:11 PM       No results found for: HBA1C, HGBE8, HBA1CEXT     Lipid Panel  Lab Results   Component Value Date/Time    Cholesterol, total 185 01/02/2017 05:12 AM    HDL Cholesterol 46 01/02/2017 05:12 AM    LDL, calculated 121.6 (H) 01/02/2017 05:12 AM    Triglyceride 87 01/02/2017 05:12 AM    CHOL/HDL Ratio 4.0 01/02/2017 05:12 AM        Discharge Diagnosis: Please refer to physician's discharge summary.     Discharge Plan:  Pt will be discharging to his home today on greyhound bus.  Pt will be transported by hospital volunteer transportation with [facility] staff to insure patient gets on correct greyhound bus to [facility] where he will be picked up at the station this evening by his sister Melbourne Abts.  Sister is in agreement with plan.  Plans have been made for [place] and social worker to meet with patient on Saturday.      Discharge Medication List and Instructions:   Current Discharge Medication List  START taking these medications    Details   aspirin 81 mg chewable tablet Take 1 Tab by mouth daily for 14 days. Indications: myocardial infarction prevention  Qty: 14 Tab, Refills: 0      QUEtiapine (SEROQUEL) 25 mg tablet Take 3 Tabs by mouth nightly for 14 days. Indications: DEPRESSION TREATMENT ADJUNCT  Qty: 42 Tab, Refills: 0      sertraline (ZOLOFT) 50 mg tablet Take 1 Tab by mouth daily for 14 days. Indications: major depressive disorder  Qty: 14 Tab, Refills: 0         CONTINUE these medications which have CHANGED    Details    !! amLODIPine (NORVASC) 10 mg tablet Take 1 Tab by mouth daily for 14 days. Indications: hypertension  Qty: 14 Tab, Refills: 0      !! atenolol (TENORMIN) 25 mg tablet Take 1 Tab by mouth daily for 14 days. Indications: hypertension  Qty: 14 Tab, Refills: 0       !! - Potential duplicate medications found. Please discuss with provider.      CONTINUE these medications which have NOT CHANGED    Details   !! amLODIPine (NORVASC) 10 mg tablet Take 1 Tab by mouth daily. Indications: HYPERTENSION  Qty: 14 Tab, Refills: 1      !! atenolol (TENORMIN) 25 mg tablet Take 1 Tab by mouth daily. Indications: HYPERTENSION  Qty: 14 Tab, Refills: 1       !! - Potential duplicate medications found. Please discuss with provider.      STOP taking these medications       lithium carbonate 300 mg capsule Comments:   Reason for Stopping:         divalproex ER (DEPAKOTE ER) 500 mg ER tablet Comments:   Reason for Stopping:         QUEtiapine SR (SEROQUEL XR) 300 mg sr tablet Comments:   Reason for Stopping:               Unresulted Labs     None        To obtain results of studies pending at discharge, please contact N/A    Follow-up Information     Follow up With Details Comments [place] on 01/12/2017 Please attend new patient appointment on Tuesday, February 20th at 12:00PM.  Lynford Humphrey  [address] [place] [address]  Phone:[phone]  FAX: [place] and social worker will come to your home on Saturday, February 17th.  They will call you with scheduled time.  Address: [place] [address]  Hours: Open 24 hours  Phone: [phone]  Appointments: [email or web address]    Phys Other, MD   Patient can only remember the practice name and not the physician            Advanced Directive:   Does the patient have an appointed surrogate decision maker? Unknown   Does the patient have a Medical Advance Directive? Unknown    Does the patient have a Psychiatric Advance Directive? Unknown   If the patient does not have a surrogate or Medical Advance Directive AND Psychiatric Advance Directive, the patient was offered information on these advance directives Unknown      Patient Instructions: Please continue all medications until otherwise directed by physician.     Tobacco Cessation Discharge Plan:   Is the patient a smoker and needs referral for smoking  cessation? No  Patient referred to the following for smoking cessation with an appointment? No   Patient was offered medication to assist with smoking cessation at discharge? No  Was education for smoking cessation added to the discharge instructions? No    Alcohol/Substance Abuse Discharge Plan:   Does the patient have a history of substance/alcohol abuse and requires a referral for treatment? No  Patient referred to the following for substance/alcohol abuse treatment with an appointment? No  Patient was offered medication to assist with alcohol cessation at discharge? No  Was education for substance/alcohol abuse added to discharge instructions? No    Patient discharged to Home; provided to the patient/caregiver either in hard copy or electronically.  Continuing care paperwork was faxed community mental health providers.     Pt is alert and oriented.  Pt denies SI/HI/AVH.  Pt's mood was euthymic. Pt's thought process was linear.  Pt's insight and judgment are fair.  Pt is in agreement with the plan.
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
## 2017-01-22 ENCOUNTER — Emergency Department: Payer: Medicare Other

## 2017-01-22 ENCOUNTER — Encounter: Payer: Self-pay | Admitting: Emergency Medicine

## 2017-01-22 ENCOUNTER — Inpatient Hospital Stay
Admission: EM | Admit: 2017-01-22 | Discharge: 2017-01-25 | DRG: 871 | Disposition: A | Discharge status: Other Acute Inpatient Hospital | Payer: Medicare Other | Attending: Internal Medicine | Admitting: Internal Medicine

## 2017-01-22 DIAGNOSIS — E0781 Sick-euthyroid syndrome: Secondary | ICD-10-CM | POA: Diagnosis present

## 2017-01-22 DIAGNOSIS — J690 Pneumonitis due to inhalation of food and vomit: Secondary | ICD-10-CM | POA: Diagnosis present

## 2017-01-22 DIAGNOSIS — E1140 Type 2 diabetes mellitus with diabetic neuropathy, unspecified: Secondary | ICD-10-CM | POA: Diagnosis present

## 2017-01-22 DIAGNOSIS — I10 Essential (primary) hypertension: Secondary | ICD-10-CM | POA: Diagnosis present

## 2017-01-22 DIAGNOSIS — E785 Hyperlipidemia, unspecified: Secondary | ICD-10-CM | POA: Diagnosis present

## 2017-01-22 DIAGNOSIS — Z7984 Long term (current) use of oral hypoglycemic drugs: Secondary | ICD-10-CM | POA: Diagnosis not present

## 2017-01-22 DIAGNOSIS — Z8673 Personal history of transient ischemic attack (TIA), and cerebral infarction without residual deficits: Secondary | ICD-10-CM

## 2017-01-22 DIAGNOSIS — Y92009 Unspecified place in unspecified non-institutional (private) residence as the place of occurrence of the external cause: Secondary | ICD-10-CM | POA: Diagnosis not present

## 2017-01-22 DIAGNOSIS — E876 Hypokalemia: Secondary | ICD-10-CM | POA: Diagnosis not present

## 2017-01-22 DIAGNOSIS — Z7982 Long term (current) use of aspirin: Secondary | ICD-10-CM | POA: Diagnosis not present

## 2017-01-22 DIAGNOSIS — G250 Essential tremor: Secondary | ICD-10-CM | POA: Diagnosis present

## 2017-01-22 DIAGNOSIS — I4891 Unspecified atrial fibrillation: Secondary | ICD-10-CM | POA: Diagnosis present

## 2017-01-22 DIAGNOSIS — I471 Supraventricular tachycardia: Secondary | ICD-10-CM | POA: Diagnosis present

## 2017-01-22 DIAGNOSIS — T520X2A Toxic effect of petroleum products, intentional self-harm, initial encounter: Secondary | ICD-10-CM | POA: Diagnosis present

## 2017-01-22 DIAGNOSIS — F319 Bipolar disorder, unspecified: Secondary | ICD-10-CM | POA: Diagnosis present

## 2017-01-22 DIAGNOSIS — Z915 Personal history of self-harm: Secondary | ICD-10-CM

## 2017-01-22 DIAGNOSIS — T1491XA Suicide attempt, initial encounter: Secondary | ICD-10-CM

## 2017-01-22 DIAGNOSIS — T17908A Unspecified foreign body in respiratory tract, part unspecified causing other injury, initial encounter: Secondary | ICD-10-CM | POA: Diagnosis not present

## 2017-01-22 DIAGNOSIS — R338 Other retention of urine: Secondary | ICD-10-CM | POA: Diagnosis present

## 2017-01-22 DIAGNOSIS — N179 Acute kidney failure, unspecified: Secondary | ICD-10-CM | POA: Diagnosis present

## 2017-01-22 DIAGNOSIS — D72829 Elevated white blood cell count, unspecified: Secondary | ICD-10-CM | POA: Diagnosis not present

## 2017-01-22 DIAGNOSIS — N401 Enlarged prostate with lower urinary tract symptoms: Secondary | ICD-10-CM | POA: Diagnosis present

## 2017-01-22 DIAGNOSIS — A419 Sepsis, unspecified organism: Secondary | ICD-10-CM

## 2017-01-22 DIAGNOSIS — E860 Dehydration: Secondary | ICD-10-CM | POA: Diagnosis present

## 2017-01-22 DIAGNOSIS — Z9114 Patient's other noncompliance with medication regimen: Secondary | ICD-10-CM

## 2017-01-22 DIAGNOSIS — Z79899 Other long term (current) drug therapy: Secondary | ICD-10-CM

## 2017-01-22 DIAGNOSIS — F332 Major depressive disorder, recurrent severe without psychotic features: Secondary | ICD-10-CM | POA: Diagnosis not present

## 2017-01-22 DIAGNOSIS — I248 Other forms of acute ischemic heart disease: Secondary | ICD-10-CM | POA: Diagnosis present

## 2017-01-22 DIAGNOSIS — F3130 Bipolar disorder, current episode depressed, mild or moderate severity, unspecified: Secondary | ICD-10-CM | POA: Diagnosis not present

## 2017-01-22 HISTORY — DX: Benign prostatic hyperplasia without lower urinary tract symptoms: N40.0

## 2017-01-22 HISTORY — DX: Type 2 diabetes mellitus without complications: E11.9

## 2017-01-22 HISTORY — DX: Hyperlipidemia, unspecified: E78.5

## 2017-01-22 LAB — SALICYLATE LEVEL

## 2017-01-22 LAB — COMPREHENSIVE METABOLIC PANEL
ALBUMIN: 4.4 g/dL (ref 3.5–5.0)
ALK PHOS: 85 U/L (ref 38–126)
ALT: 27 U/L (ref 17–63)
ANION GAP: 15 (ref 5–15)
AST: 44 U/L — ABNORMAL HIGH (ref 15–41)
BILIRUBIN TOTAL: 1.9 mg/dL — AB (ref 0.3–1.2)
BUN: 37 mg/dL — ABNORMAL HIGH (ref 6–20)
CALCIUM: 9.9 mg/dL (ref 8.9–10.3)
CO2: 21 mmol/L — ABNORMAL LOW (ref 22–32)
Chloride: 97 mmol/L — ABNORMAL LOW (ref 101–111)
Creatinine, Ser: 1.95 mg/dL — ABNORMAL HIGH (ref 0.61–1.24)
GFR calc non Af Amer: 33 mL/min — ABNORMAL LOW (ref >60)
GFR, EST AFRICAN AMERICAN: 39 mL/min — AB (ref >60)
GLUCOSE: 154 mg/dL — AB (ref 65–99)
POTASSIUM: 3.5 mmol/L (ref 3.5–5.1)
SODIUM: 133 mmol/L — AB (ref 135–145)
Total Protein: 7.8 g/dL (ref 6.5–8.1)

## 2017-01-22 LAB — CBC WITH DIFFERENTIAL/PLATELET
BASOS ABS: 0 10*3/uL (ref 0–0.1)
BASOS PCT: 0 %
Eosinophils Absolute: 0 10*3/uL (ref 0–0.7)
Eosinophils Relative: 0 %
HCT: 42.8 % (ref 40.0–52.0)
Hemoglobin: 14.9 g/dL (ref 13.0–18.0)
LYMPHS ABS: 1.8 10*3/uL (ref 1.0–3.6)
Lymphocytes Relative: 6 %
MCH: 32.1 pg (ref 26.0–34.0)
MCHC: 34.9 g/dL (ref 32.0–36.0)
MCV: 92.2 fL (ref 80.0–100.0)
MONO ABS: 2.1 10*3/uL — AB (ref 0.2–1.0)
Monocytes Relative: 7 %
NEUTROS ABS: 25.7 10*3/uL — AB (ref 1.4–6.5)
Neutrophils Relative %: 87 %
PLATELETS: 318 10*3/uL (ref 150–440)
RBC: 4.65 MIL/uL (ref 4.40–5.90)
RDW: 12.8 % (ref 11.5–14.5)
WBC: 29.6 10*3/uL — ABNORMAL HIGH (ref 3.8–10.6)

## 2017-01-22 LAB — PROTIME-INR
INR: 1.16
Prothrombin Time: 14.9 seconds (ref 11.4–15.2)

## 2017-01-22 LAB — URINALYSIS, COMPLETE (UACMP) WITH MICROSCOPIC
BACTERIA UA: NONE SEEN
Bilirubin Urine: NEGATIVE
GLUCOSE, UA: 50 mg/dL — AB
KETONES UR: 20 mg/dL — AB
Leukocytes, UA: NEGATIVE
NITRITE: NEGATIVE
PROTEIN: 100 mg/dL — AB
SQUAMOUS EPITHELIAL / LPF: NONE SEEN
Specific Gravity, Urine: 1.014 (ref 1.005–1.030)
pH: 6 (ref 5.0–8.0)

## 2017-01-22 LAB — URINE DRUG SCREEN, QUALITATIVE (ARMC ONLY)
AMPHETAMINES, UR SCREEN: NOT DETECTED
BENZODIAZEPINE, UR SCRN: NOT DETECTED
Barbiturates, Ur Screen: NOT DETECTED
CANNABINOID 50 NG, UR ~~LOC~~: NOT DETECTED
Cocaine Metabolite,Ur ~~LOC~~: NOT DETECTED
MDMA (Ecstasy)Ur Screen: NOT DETECTED
Methadone Scn, Ur: NOT DETECTED
Opiate, Ur Screen: NOT DETECTED
PHENCYCLIDINE (PCP) UR S: NOT DETECTED
TRICYCLIC, UR SCREEN: NOT DETECTED

## 2017-01-22 LAB — LIPASE, BLOOD: LIPASE: 62 U/L — AB (ref 11–51)

## 2017-01-22 LAB — CK: CK TOTAL: 381 U/L (ref 49–397)

## 2017-01-22 LAB — ACETAMINOPHEN LEVEL: Acetaminophen (Tylenol), Serum: <10 ug/mL — ABNORMAL LOW (ref 10–30)

## 2017-01-22 LAB — TROPONIN I: TROPONIN I: 0.05 ng/mL — AB (ref <0.03)

## 2017-01-22 LAB — LITHIUM LEVEL: Lithium Lvl: <0.06 mmol/L — ABNORMAL LOW (ref 0.60–1.20)

## 2017-01-22 LAB — ETHANOL: Alcohol, Ethyl (B): <5 mg/dL (ref <5)

## 2017-01-22 LAB — LACTIC ACID, PLASMA: LACTIC ACID, VENOUS: 3.1 mmol/L — AB (ref 0.5–1.9)

## 2017-01-22 MED ORDER — DILTIAZEM HCL 25 MG/5ML IV SOLN
15.0000 mg | Freq: Once | INTRAVENOUS | Status: AC
  Administered 2017-01-22: 15 mg via INTRAVENOUS
  Filled 2017-01-22: qty 5

## 2017-01-22 MED ORDER — SODIUM CHLORIDE 0.9 % IV BOLUS (SEPSIS)
1000.0000 mL | INTRAVENOUS | Status: AC
  Administered 2017-01-22 – 2017-01-23 (×3): 1000 mL via INTRAVENOUS

## 2017-01-22 MED ORDER — ADENOSINE 6 MG/2ML IV SOLN
6.0000 mg | Freq: Once | INTRAVENOUS | Status: AC
  Administered 2017-01-22: 6 mg via INTRAVENOUS
  Filled 2017-01-22: qty 2

## 2017-01-22 MED ORDER — DEXTROSE 5 % IV SOLN: 1.0000 g | Freq: Once | INTRAVENOUS | Status: DC

## 2017-01-22 MED ORDER — ADENOSINE 6 MG/2ML IV SOLN
INTRAVENOUS | Status: AC
  Administered 2017-01-22: 6 mg via INTRAVENOUS
  Filled 2017-01-22: qty 2

## 2017-01-22 MED ORDER — CEFTRIAXONE SODIUM-DEXTROSE 1-3.74 GM-% IV SOLR
1.0000 g | Freq: Once | INTRAVENOUS | Status: AC
  Administered 2017-01-22: 1 g via INTRAVENOUS
  Filled 2017-01-22: qty 50

## 2017-01-22 MED ORDER — SODIUM CHLORIDE 0.9 % IV BOLUS (SEPSIS)
1000.0000 mL | Freq: Once | INTRAVENOUS | Status: AC
  Administered 2017-01-22: 1000 mL via INTRAVENOUS

## 2017-01-22 MED ORDER — LORAZEPAM 2 MG/ML IJ SOLN
2.0000 mg | Freq: Once | INTRAMUSCULAR | Status: AC
  Administered 2017-01-22: 2 mg via INTRAVENOUS
  Filled 2017-01-22: qty 1

## 2017-01-22 NOTE — ED Notes (Signed)
Lab called to report critical lab of 3.1 lactic.  Dr. Lamont Snowballifenbark informed.

## 2017-01-22 NOTE — ED Notes (Signed)
Lab called to report troponin of 0.05, Dr. Lamont Snowballifenbark informed.
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
## 2017-01-22 NOTE — ED Notes (Signed)
Monitor alarming, noticed patient heart rate 145.  MD notified.  Verbal order 6mg  adenosine placed and administered.
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
## 2017-01-22 NOTE — ED Notes (Signed)
Pt changed into hospital gown, clothing collected and labeled, clothing and report given to quad RN, primary RN unavailable at this time.  EKG completed by this nurse

## 2017-01-23 ENCOUNTER — Inpatient Hospital Stay: Payer: Medicare Other

## 2017-01-23 ENCOUNTER — Encounter: Payer: Self-pay | Admitting: Adult Health

## 2017-01-23 DIAGNOSIS — D72829 Elevated white blood cell count, unspecified: Secondary | ICD-10-CM

## 2017-01-23 DIAGNOSIS — T1491XA Suicide attempt, initial encounter: Secondary | ICD-10-CM

## 2017-01-23 DIAGNOSIS — T17908A Unspecified foreign body in respiratory tract, part unspecified causing other injury, initial encounter: Secondary | ICD-10-CM

## 2017-01-23 DIAGNOSIS — F3130 Bipolar disorder, current episode depressed, mild or moderate severity, unspecified: Secondary | ICD-10-CM

## 2017-01-23 DIAGNOSIS — A419 Sepsis, unspecified organism: Principal | ICD-10-CM

## 2017-01-23 DIAGNOSIS — I4891 Unspecified atrial fibrillation: Secondary | ICD-10-CM

## 2017-01-23 DIAGNOSIS — I471 Supraventricular tachycardia: Secondary | ICD-10-CM

## 2017-01-23 DIAGNOSIS — N179 Acute kidney failure, unspecified: Secondary | ICD-10-CM

## 2017-01-23 LAB — T4, FREE: Free T4: 1.41 ng/dL — ABNORMAL HIGH (ref 0.61–1.12)

## 2017-01-23 LAB — CBC
HEMATOCRIT: 38.4 % — AB (ref 40.0–52.0)
HEMOGLOBIN: 13.7 g/dL (ref 13.0–18.0)
MCH: 33.2 pg (ref 26.0–34.0)
MCHC: 35.8 g/dL (ref 32.0–36.0)
MCV: 92.8 fL (ref 80.0–100.0)
Platelets: 279 10*3/uL (ref 150–440)
RBC: 4.13 MIL/uL — ABNORMAL LOW (ref 4.40–5.90)
RDW: 12.4 % (ref 11.5–14.5)
WBC: 25.4 10*3/uL — ABNORMAL HIGH (ref 3.8–10.6)

## 2017-01-23 LAB — BASIC METABOLIC PANEL
Anion gap: 8 (ref 5–15)
BUN: 23 mg/dL — AB (ref 6–20)
CHLORIDE: 107 mmol/L (ref 101–111)
CO2: 27 mmol/L (ref 22–32)
CREATININE: 0.93 mg/dL (ref 0.61–1.24)
Calcium: 8.4 mg/dL — ABNORMAL LOW (ref 8.9–10.3)
GFR calc non Af Amer: >60 mL/min (ref >60)
Glucose, Bld: 111 mg/dL — ABNORMAL HIGH (ref 65–99)
Potassium: 3.3 mmol/L — ABNORMAL LOW (ref 3.5–5.1)
Sodium: 142 mmol/L (ref 135–145)

## 2017-01-23 LAB — COMPREHENSIVE METABOLIC PANEL
ALT: 24 U/L (ref 17–63)
AST: 39 U/L (ref 15–41)
Albumin: 3.8 g/dL (ref 3.5–5.0)
Alkaline Phosphatase: 73 U/L (ref 38–126)
Anion gap: 14 (ref 5–15)
BUN: 34 mg/dL — AB (ref 6–20)
CHLORIDE: 102 mmol/L (ref 101–111)
CO2: 21 mmol/L — ABNORMAL LOW (ref 22–32)
Calcium: 8.9 mg/dL (ref 8.9–10.3)
Creatinine, Ser: 1.37 mg/dL — ABNORMAL HIGH (ref 0.61–1.24)
GFR calc Af Amer: 59 mL/min — ABNORMAL LOW (ref >60)
GFR, EST NON AFRICAN AMERICAN: 51 mL/min — AB (ref >60)
Glucose, Bld: 117 mg/dL — ABNORMAL HIGH (ref 65–99)
Potassium: 3.8 mmol/L (ref 3.5–5.1)
Sodium: 137 mmol/L (ref 135–145)
Total Bilirubin: 1 mg/dL (ref 0.3–1.2)
Total Protein: 6.9 g/dL (ref 6.5–8.1)

## 2017-01-23 LAB — GLUCOSE, CAPILLARY
GLUCOSE-CAPILLARY: 95 mg/dL (ref 65–99)
Glucose-Capillary: 102 mg/dL — ABNORMAL HIGH (ref 65–99)

## 2017-01-23 LAB — URINALYSIS, ROUTINE W REFLEX MICROSCOPIC
BACTERIA UA: NONE SEEN
BILIRUBIN URINE: NEGATIVE
Glucose, UA: NEGATIVE mg/dL
KETONES UR: 5 mg/dL — AB
LEUKOCYTES UA: NEGATIVE
Nitrite: NEGATIVE
PROTEIN: NEGATIVE mg/dL
SPECIFIC GRAVITY, URINE: 1.01 (ref 1.005–1.030)
SQUAMOUS EPITHELIAL / LPF: NONE SEEN
pH: 6 (ref 5.0–8.0)

## 2017-01-23 LAB — MRSA PCR SCREENING: MRSA BY PCR: NEGATIVE

## 2017-01-23 LAB — TROPONIN I
TROPONIN I: 0.05 ng/mL — AB (ref <0.03)
TROPONIN I: 0.03 ng/mL — AB (ref <0.03)
Troponin I: 0.03 ng/mL (ref <0.03)

## 2017-01-23 LAB — MAGNESIUM: Magnesium: 2.4 mg/dL (ref 1.7–2.4)

## 2017-01-23 LAB — PHOSPHORUS: PHOSPHORUS: 3.7 mg/dL (ref 2.5–4.6)

## 2017-01-23 LAB — PROCALCITONIN: PROCALCITONIN: 0.71 ng/mL

## 2017-01-23 LAB — TSH
TSH: 0.275 u[IU]/mL — ABNORMAL LOW (ref 0.350–4.500)
TSH: 0.282 u[IU]/mL — AB (ref 0.350–4.500)

## 2017-01-23 LAB — LACTIC ACID, PLASMA: Lactic Acid, Venous: 1.5 mmol/L (ref 0.5–1.9)

## 2017-01-23 MED ORDER — TAMSULOSIN HCL 0.4 MG PO CAPS
0.4000 mg | ORAL_CAPSULE | Freq: Every day | ORAL | Status: DC
  Administered 2017-01-23 – 2017-01-25 (×3): 0.4 mg via ORAL
  Filled 2017-01-23 (×3): qty 1

## 2017-01-23 MED ORDER — POTASSIUM CHLORIDE 20 MEQ PO PACK
40.0000 meq | PACK | Freq: Once | ORAL | Status: AC
  Administered 2017-01-23: 40 meq via ORAL
  Filled 2017-01-23: qty 2

## 2017-01-23 MED ORDER — AMIODARONE HCL 200 MG PO TABS
400.0000 mg | ORAL_TABLET | Freq: Every day | ORAL | Status: DC
  Administered 2017-01-23: 400 mg via ORAL
  Filled 2017-01-23: qty 2

## 2017-01-23 MED ORDER — ALBUTEROL SULFATE (2.5 MG/3ML) 0.083% IN NEBU: 2.5000 mg | INHALATION_SOLUTION | Freq: Four times a day (QID) | RESPIRATORY_TRACT | Status: DC | PRN

## 2017-01-23 MED ORDER — ENOXAPARIN SODIUM 40 MG/0.4ML ~~LOC~~ SOLN
40.0000 mg | Freq: Every day | SUBCUTANEOUS | Status: DC
  Administered 2017-01-23 – 2017-01-24 (×3): 40 mg via SUBCUTANEOUS
  Filled 2017-01-23 (×3): qty 0.4

## 2017-01-23 MED ORDER — PRIMIDONE 50 MG PO TABS
50.0000 mg | ORAL_TABLET | Freq: Every day | ORAL | Status: DC
  Administered 2017-01-23 – 2017-01-24 (×3): 50 mg via ORAL
  Filled 2017-01-23 (×4): qty 1

## 2017-01-23 MED ORDER — BISACODYL 5 MG PO TBEC: 5.0000 mg | DELAYED_RELEASE_TABLET | Freq: Every day | ORAL | Status: DC | PRN

## 2017-01-23 MED ORDER — VANCOMYCIN HCL IN DEXTROSE 1-5 GM/200ML-% IV SOLN
1000.0000 mg | Freq: Once | INTRAVENOUS | Status: AC
  Administered 2017-01-23: 1000 mg via INTRAVENOUS
  Filled 2017-01-23: qty 200

## 2017-01-23 MED ORDER — ASPIRIN 81 MG PO CHEW
81.0000 mg | CHEWABLE_TABLET | Freq: Every day | ORAL | Status: DC
  Administered 2017-01-23 – 2017-01-25 (×3): 81 mg via ORAL
  Filled 2017-01-23 (×3): qty 1

## 2017-01-23 MED ORDER — SODIUM CHLORIDE 0.9% FLUSH
3.0000 mL | Freq: Two times a day (BID) | INTRAVENOUS | Status: DC
  Administered 2017-01-23 (×2): 3 mL via INTRAVENOUS

## 2017-01-23 MED ORDER — VENLAFAXINE HCL ER 75 MG PO CP24
300.0000 mg | ORAL_CAPSULE | Freq: Every day | ORAL | Status: DC
  Administered 2017-01-23: 300 mg via ORAL
  Filled 2017-01-23: qty 4

## 2017-01-23 MED ORDER — MAGNESIUM CITRATE PO SOLN: 1.0000 | Freq: Once | ORAL | Status: DC | PRN

## 2017-01-23 MED ORDER — SENNOSIDES-DOCUSATE SODIUM 8.6-50 MG PO TABS: 1.0000 | ORAL_TABLET | Freq: Every evening | ORAL | Status: DC | PRN

## 2017-01-23 MED ORDER — QUETIAPINE FUMARATE 25 MG PO TABS
50.0000 mg | ORAL_TABLET | Freq: Every day | ORAL | Status: DC
  Administered 2017-01-23: 50 mg via ORAL
  Filled 2017-01-23: qty 2

## 2017-01-23 MED ORDER — ACETAMINOPHEN 325 MG PO TABS: 650.0000 mg | ORAL_TABLET | Freq: Four times a day (QID) | ORAL | Status: DC | PRN

## 2017-01-23 MED ORDER — VANCOMYCIN HCL IN DEXTROSE 1-5 GM/200ML-% IV SOLN
1000.0000 mg | INTRAVENOUS | Status: DC
  Filled 2017-01-23: qty 200

## 2017-01-23 MED ORDER — METOPROLOL TARTRATE 5 MG/5ML IV SOLN
2.5000 mg | INTRAVENOUS | Status: DC | PRN
  Administered 2017-01-23: 2.5 mg via INTRAVENOUS
  Filled 2017-01-23: qty 5

## 2017-01-23 MED ORDER — METOPROLOL TARTRATE 5 MG/5ML IV SOLN: 5.0000 mg | INTRAVENOUS | Status: DC | PRN

## 2017-01-23 MED ORDER — PROPRANOLOL HCL ER 60 MG PO CP24
60.0000 mg | ORAL_CAPSULE | Freq: Every day | ORAL | Status: DC
  Administered 2017-01-23: 60 mg via ORAL
  Filled 2017-01-23: qty 1

## 2017-01-23 MED ORDER — ONDANSETRON HCL 4 MG PO TABS: 4.0000 mg | ORAL_TABLET | Freq: Four times a day (QID) | ORAL | Status: DC | PRN

## 2017-01-23 MED ORDER — METOPROLOL TARTRATE 5 MG/5ML IV SOLN: 2.5000 mg | INTRAVENOUS | Status: DC | PRN

## 2017-01-23 MED ORDER — SIMVASTATIN 20 MG PO TABS
10.0000 mg | ORAL_TABLET | Freq: Every day | ORAL | Status: DC
  Administered 2017-01-23 – 2017-01-24 (×2): 10 mg via ORAL
  Filled 2017-01-23 (×2): qty 1

## 2017-01-23 MED ORDER — LITHIUM CARBONATE ER 300 MG PO TBCR
600.0000 mg | EXTENDED_RELEASE_TABLET | Freq: Every day | ORAL | Status: DC
  Administered 2017-01-23: 600 mg via ORAL
  Filled 2017-01-23: qty 2

## 2017-01-23 MED ORDER — AMIODARONE HCL 200 MG PO TABS: 400.0000 mg | ORAL_TABLET | Freq: Every day | ORAL | Status: DC

## 2017-01-23 MED ORDER — PIPERACILLIN-TAZOBACTAM 3.375 G IVPB
3.3750 g | Freq: Once | INTRAVENOUS | Status: AC
  Administered 2017-01-23: 3.375 g via INTRAVENOUS
  Filled 2017-01-23: qty 50

## 2017-01-23 MED ORDER — IPRATROPIUM BROMIDE 0.02 % IN SOLN: 0.5000 mg | Freq: Four times a day (QID) | RESPIRATORY_TRACT | Status: DC | PRN

## 2017-01-23 MED ORDER — SODIUM BICARBONATE 8.4 % IV SOLN
150.0000 meq | Freq: Once | INTRAVENOUS | Status: AC
  Administered 2017-01-23: 150 meq via INTRAVENOUS
  Filled 2017-01-23: qty 150

## 2017-01-23 MED ORDER — PIPERACILLIN-TAZOBACTAM 3.375 G IVPB
3.3750 g | Freq: Three times a day (TID) | INTRAVENOUS | Status: DC
  Administered 2017-01-23 – 2017-01-24 (×4): 3.375 g via INTRAVENOUS
  Filled 2017-01-23 (×4): qty 50

## 2017-01-23 MED ORDER — METOPROLOL TARTRATE 25 MG PO TABS
25.0000 mg | ORAL_TABLET | Freq: Four times a day (QID) | ORAL | Status: DC
  Administered 2017-01-23 (×2): 25 mg via ORAL
  Filled 2017-01-23 (×3): qty 1

## 2017-01-23 MED ORDER — SODIUM CHLORIDE 0.9 % IV SOLN
INTRAVENOUS | Status: DC
  Administered 2017-01-23 – 2017-01-25 (×5): via INTRAVENOUS

## 2017-01-23 MED ORDER — OXYCODONE HCL 5 MG PO TABS: 5.0000 mg | ORAL_TABLET | ORAL | Status: DC | PRN

## 2017-01-23 MED ORDER — ACETAMINOPHEN 650 MG RE SUPP: 650.0000 mg | Freq: Four times a day (QID) | RECTAL | Status: DC | PRN

## 2017-01-23 MED ORDER — ONDANSETRON HCL 4 MG/2ML IJ SOLN: 4.0000 mg | Freq: Four times a day (QID) | INTRAMUSCULAR | Status: DC | PRN

## 2017-01-23 MED ORDER — AMIODARONE HCL 200 MG PO TABS: 200.0000 mg | ORAL_TABLET | Freq: Every day | ORAL | Status: DC

## 2017-01-23 MED ORDER — OLANZAPINE 10 MG PO TABS
20.0000 mg | ORAL_TABLET | Freq: Every day | ORAL | Status: DC
  Administered 2017-01-23: 20 mg via ORAL
  Filled 2017-01-23: qty 2

## 2017-01-23 NOTE — Progress Notes (Signed)
Pharmacy Antibiotic Note  Kyle [name] is a 71 y.o. male admitted on 01/22/2017 with sepsis.  Pharmacy has been consulted for vancomycin and Zosyn dosing.  Plan: 1. Zosyn 3.375 gm IV Q8H EI 2. Vancomycin 1 gm IV x 1 followed in approximately 11 hours (stacked dosing) by vancomycin 1 gm IV Q24H, predicted trough 17.5 mcg/mL. Pharmacy will continue to follow and adjust as needed to maintain trough 15 to 20 mcg/mL.   Vd 47.9 L, ke 0.033 hr-1, T1/2 20.9 hr  Height: 5\' 8"  (172.7 cm) Weight: 180 lb (81.6 kg) IBW/kg (Calculated) : 68.4  Temp (24hrs), Avg:99.1 F (37.3 C), Min:99.1 F (37.3 C), Max:99.1 F (37.3 C)   Recent Labs Lab 01/22/17 1957  WBC 29.6*  CREATININE 1.95*  LATICACIDVEN 3.1*    Estimated Creatinine Clearance: 34.6 mL/min (by C-G formula based on SCr of 1.95 mg/dL (H)).    No Known Allergies  Thank you for allowing pharmacy to be a part of this patient's care.  Carola FrostNathan A Jacobi Ryant, Pharm.D., BCPS Clinical Pharmacist 01/23/2017 1:15 AM

## 2017-01-23 NOTE — Progress Notes (Signed)
Pt is due for PO metoprolol, but order parameters state to hold PO metoprolol if SBP<110. Pt's current SBP is 104 with a HR of 118. Elink called and per Dr. Crista CurbNestor's order, will hold PO metoprolol at this time. Pt continues to deny SOB, CP, lightheadedness, and dizziness.

## 2017-01-23 NOTE — Progress Notes (Signed)
Pt transferred to room 250 with safety sitter. Pt transferred off of ICU monitor to tele monitor 40-26 in room 250. Irving BurtonEmily, RN was there to greet pt.

## 2017-01-23 NOTE — Consult Note (Signed)
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 plan to admit to inpatient psychiatry for psychotropic medication management, safety and stabilization once medically stable. The patient will need psychotropic medication management at discharge and will look at group home placement if possible.   Daily contact with patient to assess and evaluate symptoms and progress in treatment and Medication management  Disposition: Recommend psychiatric Inpatient admission when medically cleared.  Jay Schlichter, MD 01/23/2017 11:44 AM
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
## 2017-01-23 NOTE — Progress Notes (Signed)
eLink Physician-Brief Progress Note Patient Name: Kyle JacquetMarvin D [name] DOB: 08-Feb-1946 MRN: [id number]   Date of Service  01/23/2017  HPI/Events of Note  Potassium 3.3. Creatinine 0.93. Patient taking oral medications.   eICU Interventions  KCl 40 mEq packet by mouth times 1     Intervention Category Intermediate Interventions: Electrolyte abnormality - evaluation and management  Lawanda CousinsJennings Kanav Kazmierczak 01/23/2017, 6:03 PM

## 2017-01-23 NOTE — Progress Notes (Signed)
Patent had no urine output s/p 3L of IVF. MD notified. Bladder scanned for greater than 999. Foley ordered for acute urinary retention. 1,100cc urine output from foley. HR decreased to 70's. Patient able to rest at this time. Lab noted to be trending towards the normal direction. 1:1 sitter at bedside. Will continue to monitor and endorse.

## 2017-01-23 NOTE — Progress Notes (Signed)
Foley catheter removed at 1315. Pt DTV by [id number].

## 2017-01-23 NOTE — Progress Notes (Signed)
Pt transferring to 2A this afternoon. Pt is aware of transfer and Irving BurtonEmily, RN (receiving nurse) has been given report. 1:1 Sitter to transfer with pt.

## 2017-01-23 NOTE — Progress Notes (Addendum)
Pt's HR/rhythm has unsustained periods of going up to the 120s with an irregular beat. Central telemetry called, and they stated it looks as if he was in a-flutter when his HR was up in the 120s. Pt's HR is now back in the 70s-80s and is regular at sinus rhythm. Pt denies any palpitations, chest pain, SOB, and lightheadedness. VSS: BP 113/65   Pulse 77   Temp 99.1 F (37.3 C) (Axillary)   Resp (!) 21   Ht 5\' 8"  (1.727 m)   Wt 81.6 kg (180 lb)   SpO2 96%   BMI 27.37 kg/m . Dr. Mariah MillingGollan paged, but I could not get through. I called [facility] and the on-call PA called me back. She stated [facility] is at [facility] and would try to get a hold of Dr. Mariah MillingGollan for me. In the meantime, I paged Dr. Cherlynn KaiserSainani and updated him about current situation and told him that the patient's most recent troponin came back at 0.03. Dr. Cherlynn KaiserSainani stated that pt could be transferred out of ICU to 2A with telemetry. Order to transfer patient placed.

## 2017-01-23 NOTE — BH Assessment (Signed)
Per rounding [facility] Attending Physician, Dr. Daleen Boavi, Dr. Maryruth BunKapur will taking care of the consults.  Writer informed psychiatrist (Dr. Maryruth BunKapur) of consult.

## 2017-01-23 NOTE — Progress Notes (Signed)
Pt's troponin came back at 0.03. This is a decrease from 0.05 at 0100. Dr. Cherlynn KaiserSainani notified via text-page system. No new orders received.

## 2017-01-23 NOTE — Progress Notes (Signed)
During my morning assessment pt denied suicidal ideation. He requested [employer] shrimp from the nurse technician and myself for lunch. We explained that he can not have outside food brought in as he is currently IVC'd. I left the room to grab him some crackers and water. While I was gone, he stated to the nurse tech that he would "kill" himself when he leaves here. Dr. Maryruth BunKapur (rounding for psychiatry) was updated as she was on floor when patient made that statement to the nurse tech. No new orders received. Pt continues to be on 1:1 suicide precautions.

## 2017-01-23 NOTE — Progress Notes (Addendum)
Dr. Mariah MillingGollan called back and I updated him about the pt experiencing unsustained periods of an irregular rhythm, and that per central tele, this rhythm looks like a-flutter. I also told him that pt denies symptoms during these periods. Dr. Lewie LoronGolan stated that pt may have these periods and that his medication may need further adjustment. For now, he stated to use PRN IV metoprolol if HR is above 110 and sustains. He stated to first try 2.5mg  IV PRN of metoprolol for HR >110 as long as SBP is >100. If the 2.5mg  of IV metoprolol does not help, Dr. Lewie LoronGolan stated to give an additional 2.5mg  of IV metoprolol for sustained HR of >110 and SBP >100. He is in agreement with Dr. Cherlynn KaiserSainani that pt may be transferred to 2A with telemetry.

## 2017-01-23 NOTE — Progress Notes (Signed)
Rec'd from ED awake and alert x2. Patient reports wanting to harm himself earlier in the day but now feels better. Denied pain. 1:1 sitter maintained. Will cont to monitor.

## 2017-01-23 NOTE — Progress Notes (Signed)
Unable to complete admission assessment due to AMS and no family at bedside.
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
## 2017-01-23 NOTE — Progress Notes (Signed)
PT Cancellation Note  Patient Details Name: Kyle JacquetMarvin D Hollomon MRN: [id number] DOB: 05/19/1946   Cancelled Treatment:    Reason Eval/Treat Not Completed: Patient's level of consciousness.  Nursing unable to medicate oral meds, will try later as pt can allow.   Ivar DrapeRuth E Alima Naser 01/23/2017, 9:06 AM

## 2017-01-23 NOTE — ED Notes (Signed)
Called to give report, nurse will return call when ready.

## 2017-01-24 ENCOUNTER — Inpatient Hospital Stay: Payer: Medicare Other

## 2017-01-24 LAB — CBC
HEMATOCRIT: 33.4 % — AB (ref 40.0–52.0)
Hemoglobin: 11.4 g/dL — ABNORMAL LOW (ref 13.0–18.0)
MCH: 32.5 pg (ref 26.0–34.0)
MCHC: 34.1 g/dL (ref 32.0–36.0)
MCV: 95.5 fL (ref 80.0–100.0)
PLATELETS: 204 10*3/uL (ref 150–440)
RBC: 3.5 MIL/uL — ABNORMAL LOW (ref 4.40–5.90)
RDW: 12.6 % (ref 11.5–14.5)
WBC: 10.4 10*3/uL (ref 3.8–10.6)

## 2017-01-24 LAB — HEMOGLOBIN A1C
HEMOGLOBIN A1C: 5.2 % (ref 4.8–5.6)
MEAN PLASMA GLUCOSE: 103 mg/dL

## 2017-01-24 LAB — URINE CULTURE: Culture: NO GROWTH

## 2017-01-24 LAB — GLUCOSE, CAPILLARY: Glucose-Capillary: 83 mg/dL (ref 65–99)

## 2017-01-24 LAB — BASIC METABOLIC PANEL
Anion gap: 6 (ref 5–15)
BUN: 17 mg/dL (ref 6–20)
CALCIUM: 8.3 mg/dL — AB (ref 8.9–10.3)
CO2: 24 mmol/L (ref 22–32)
CREATININE: 0.78 mg/dL (ref 0.61–1.24)
Chloride: 108 mmol/L (ref 101–111)
GFR calc Af Amer: >60 mL/min (ref >60)
GLUCOSE: 84 mg/dL (ref 65–99)
Potassium: 3 mmol/L — ABNORMAL LOW (ref 3.5–5.1)
Sodium: 138 mmol/L (ref 135–145)

## 2017-01-24 LAB — PROCALCITONIN: PROCALCITONIN: 0.27 ng/mL

## 2017-01-24 LAB — MAGNESIUM: Magnesium: 2.3 mg/dL (ref 1.7–2.4)

## 2017-01-24 LAB — T3: T3, Total: 67 ng/dL — ABNORMAL LOW (ref 71–180)

## 2017-01-24 LAB — T3, FREE: T3, Free: 2.2 pg/mL (ref 2.0–4.4)

## 2017-01-24 MED ORDER — METOPROLOL TARTRATE 25 MG PO TABS
25.0000 mg | ORAL_TABLET | Freq: Two times a day (BID) | ORAL | Status: DC
  Administered 2017-01-24 – 2017-01-25 (×2): 25 mg via ORAL
  Filled 2017-01-24 (×2): qty 1

## 2017-01-24 MED ORDER — POTASSIUM CHLORIDE CRYS ER 20 MEQ PO TBCR
20.0000 meq | EXTENDED_RELEASE_TABLET | Freq: Two times a day (BID) | ORAL | Status: DC
  Administered 2017-01-24 – 2017-01-25 (×3): 20 meq via ORAL
  Filled 2017-01-24 (×3): qty 1

## 2017-01-24 MED ORDER — AMOXICILLIN-POT CLAVULANATE 875-125 MG PO TABS
1.0000 | ORAL_TABLET | Freq: Two times a day (BID) | ORAL | Status: DC
  Administered 2017-01-24 – 2017-01-25 (×3): 1 via ORAL
  Filled 2017-01-24 (×3): qty 1

## 2017-01-24 MED ORDER — SERTRALINE HCL 50 MG PO TABS
50.0000 mg | ORAL_TABLET | Freq: Every day | ORAL | Status: DC
  Administered 2017-01-24 – 2017-01-25 (×2): 50 mg via ORAL
  Filled 2017-01-24 (×2): qty 1
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
## 2017-01-24 NOTE — Progress Notes (Signed)
Notified MD of Pt not voided since evening. Informed MD of bladder scan. Stated if bladder scan > 400, in and out cath.Will continue to monitor and assess.
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
## 2017-01-24 NOTE — Progress Notes (Signed)
Poison controlled called. No side effects.  Revonda Standardllison signed off on pt. Will continue to monitor and assess.
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
## 2017-01-24 NOTE — Clinical Social Work Note (Signed)
CSW received consult for suicidal ideation. Psych is following and has assessed. According to the psych note, the patient will most likely dc to BMU once medically stable for medication management. CSW signing off, but please consult if any needs arise.  Argentina PonderKaren Martha Gilda Abboud, MSW, Theresia MajorsLCSWA [phone]

## 2017-01-24 NOTE — Progress Notes (Signed)
"  I'm tired of trying to live."   Responds are slow. Says he doesn't know where he is unless pressed to answer the question. Then he answers correctly. No urine ouput since nights. Bladder scan shows 600 ml.  Says he doesn't want to urinate.  Stood him up, ran water.  He falled to urinate.  He is on Flomax for BPH.  Straight cath to empty bladder

## 2017-01-24 NOTE — Progress Notes (Signed)
CH responded to a PG for Pt in room 250. Pt presented non-responsive. RN stated the Pt shut down emotionally around 9:30 AM today. CH attempted several times to evoke a response. CH provided an audible bedside prayer. CH informed Pt and RN that our services are available 24/7 should the Pt become responsive. CH is available for follow up as needed.    01/24/17 1200  Clinical Encounter Type  Visited With Patient;Health care provider  Visit Type Initial;Psychological support;Spiritual support  Referral From Nurse  Consult/Referral To Chaplain  Spiritual Encounters  Spiritual Needs Prayer

## 2017-01-24 NOTE — Clinical Social Work Note (Signed)
CSW saw through chart review that PT is recommending SNF. CSW attempted to assess patient, but he would not rouse, even with sternal rub. Due to the issues with SI/SMI and his lack of support at home, contacting family is not appropriate at this time to conduct a collateral assessment. CSW will con't to follow. The [facility] in a Level II PASRR and will be a barrier for SNF placement.   Argentina PonderKaren Martha Jarelyn Bambach, MSW, Theresia MajorsLCSWA [phone]

## 2017-01-25 ENCOUNTER — Encounter: Payer: Self-pay | Admitting: *Deleted

## 2017-01-25 ENCOUNTER — Inpatient Hospital Stay
Admission: EM | Admit: 2017-01-25 | Discharge: 2017-01-26 | DRG: 885 | Disposition: A | Discharge status: Home / Self Care | Payer: Medicare Other | Source: Intra-hospital | Attending: Psychiatry | Admitting: Psychiatry

## 2017-01-25 DIAGNOSIS — I679 Cerebrovascular disease, unspecified: Secondary | ICD-10-CM | POA: Diagnosis present

## 2017-01-25 DIAGNOSIS — R45851 Suicidal ideations: Secondary | ICD-10-CM | POA: Diagnosis present

## 2017-01-25 DIAGNOSIS — Z79899 Other long term (current) drug therapy: Secondary | ICD-10-CM

## 2017-01-25 DIAGNOSIS — Z91199 Patient's noncompliance with other medical treatment and regimen due to unspecified reason: Secondary | ICD-10-CM

## 2017-01-25 DIAGNOSIS — E1142 Type 2 diabetes mellitus with diabetic polyneuropathy: Secondary | ICD-10-CM | POA: Diagnosis present

## 2017-01-25 DIAGNOSIS — G2581 Restless legs syndrome: Secondary | ICD-10-CM | POA: Diagnosis present

## 2017-01-25 DIAGNOSIS — I10 Essential (primary) hypertension: Secondary | ICD-10-CM | POA: Diagnosis present

## 2017-01-25 DIAGNOSIS — F319 Bipolar disorder, unspecified: Principal | ICD-10-CM | POA: Diagnosis present

## 2017-01-25 DIAGNOSIS — Z818 Family history of other mental and behavioral disorders: Secondary | ICD-10-CM

## 2017-01-25 DIAGNOSIS — Z9119 Patient's noncompliance with other medical treatment and regimen: Secondary | ICD-10-CM

## 2017-01-25 DIAGNOSIS — Z9114 Patient's other noncompliance with medication regimen: Secondary | ICD-10-CM

## 2017-01-25 DIAGNOSIS — R4182 Altered mental status, unspecified: Secondary | ICD-10-CM

## 2017-01-25 DIAGNOSIS — Z915 Personal history of self-harm: Secondary | ICD-10-CM | POA: Diagnosis not present

## 2017-01-25 DIAGNOSIS — T1491XA Suicide attempt, initial encounter: Secondary | ICD-10-CM | POA: Diagnosis present

## 2017-01-25 DIAGNOSIS — E119 Type 2 diabetes mellitus without complications: Secondary | ICD-10-CM | POA: Diagnosis present

## 2017-01-25 DIAGNOSIS — G250 Essential tremor: Secondary | ICD-10-CM | POA: Diagnosis present

## 2017-01-25 DIAGNOSIS — N400 Enlarged prostate without lower urinary tract symptoms: Secondary | ICD-10-CM | POA: Diagnosis present

## 2017-01-25 DIAGNOSIS — I471 Supraventricular tachycardia: Secondary | ICD-10-CM | POA: Diagnosis present

## 2017-01-25 DIAGNOSIS — E785 Hyperlipidemia, unspecified: Secondary | ICD-10-CM | POA: Diagnosis present

## 2017-01-25 DIAGNOSIS — F061 Catatonic disorder due to known physiological condition: Secondary | ICD-10-CM | POA: Diagnosis not present

## 2017-01-25 DIAGNOSIS — F3130 Bipolar disorder, current episode depressed, mild or moderate severity, unspecified: Secondary | ICD-10-CM | POA: Diagnosis not present

## 2017-01-25 DIAGNOSIS — G629 Polyneuropathy, unspecified: Secondary | ICD-10-CM

## 2017-01-25 LAB — PROCALCITONIN: Procalcitonin: <0.1 ng/mL

## 2017-01-25 LAB — GLUCOSE, CAPILLARY: Glucose-Capillary: 130 mg/dL — ABNORMAL HIGH (ref 65–99)

## 2017-01-25 MED ORDER — METOPROLOL TARTRATE 25 MG PO TABS: 25.0000 mg | ORAL_TABLET | Freq: Two times a day (BID) | ORAL | 0 refills | Status: DC

## 2017-01-25 MED ORDER — MAGNESIUM HYDROXIDE 400 MG/5ML PO SUSP: 30.0000 mL | Freq: Every day | ORAL | Status: DC | PRN

## 2017-01-25 MED ORDER — SIMVASTATIN 10 MG PO TABS
10.0000 mg | ORAL_TABLET | Freq: Every day | ORAL | Status: DC
  Administered 2017-01-25: 10 mg via ORAL
  Filled 2017-01-25: qty 1

## 2017-01-25 MED ORDER — AMOXICILLIN-POT CLAVULANATE 875-125 MG PO TABS: 1.0000 | ORAL_TABLET | Freq: Two times a day (BID) | ORAL | 0 refills | Status: DC

## 2017-01-25 MED ORDER — METFORMIN HCL 500 MG PO TABS
500.0000 mg | ORAL_TABLET | Freq: Two times a day (BID) | ORAL | Status: DC
  Administered 2017-01-25 – 2017-01-26 (×2): 500 mg via ORAL
  Filled 2017-01-25 (×2): qty 1

## 2017-01-25 MED ORDER — TAMSULOSIN HCL 0.4 MG PO CAPS
0.4000 mg | ORAL_CAPSULE | Freq: Every day | ORAL | Status: DC
  Administered 2017-01-25: 0.4 mg via ORAL
  Filled 2017-01-25: qty 1

## 2017-01-25 MED ORDER — QUETIAPINE FUMARATE 100 MG PO TABS
100.0000 mg | ORAL_TABLET | Freq: Every day | ORAL | Status: DC
  Administered 2017-01-25: 100 mg via ORAL
  Filled 2017-01-25: qty 1

## 2017-01-25 MED ORDER — ALUM & MAG HYDROXIDE-SIMETH 200-200-20 MG/5ML PO SUSP: 30.0000 mL | ORAL | Status: DC | PRN

## 2017-01-25 MED ORDER — ACETAMINOPHEN 325 MG PO TABS: 650.0000 mg | ORAL_TABLET | Freq: Four times a day (QID) | ORAL | Status: DC | PRN

## 2017-01-25 MED ORDER — ASPIRIN EC 81 MG PO TBEC
81.0000 mg | DELAYED_RELEASE_TABLET | Freq: Every day | ORAL | Status: DC
  Administered 2017-01-26: 81 mg via ORAL
  Filled 2017-01-25 (×2): qty 1

## 2017-01-25 NOTE — Progress Notes (Signed)
Patient does not have advance directive.

## 2017-01-25 NOTE — [facility] Group Notes (Signed)
[facility] Group Notes:  (Nursing/MHT/Case Management/Adjunct)  Date:  01/25/2017  Time:  5:52 PM  Type of Therapy:  Psychoeducational Skills  Participation Level:  Did Not Attend  Iyonnah Ferrante C Johanna Matto 01/25/2017, 5:52 PM 

## 2017-01-25 NOTE — Plan of Care (Signed)
Problem: Education: Goal: Knowledge of [facility] Education information/materials will improve Outcome: Progressing Receptive  To information  Received

## 2017-01-25 NOTE — Progress Notes (Signed)
Patient says he hurts all over.  Says he's hurt like this for a long time.
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
## 2017-01-25 NOTE — Care Management Important Message (Signed)
Important Message  Patient Details  Name: Kyle [name] MRN: [id number] Date of Birth: June 16, 1946   Medicare Important Message Given:  Yes    Marily MemosLisa M Dyllen Menning, RN 01/25/2017, 11:58 AM

## 2017-01-25 NOTE — [facility] Group Notes (Signed)
[facility] Group Notes:  (Nursing/MHT/Case Management/Adjunct)  Date:  01/25/2017  Time:  11:12 PM  Type of Therapy:  Psychoeducational Skills  Participation Level:  Did Not Attend  Participation Quality:   Summary of Progress/Problems:  Kyle NeerJackie L Rufus Cypert 01/25/2017, 11:12 PM
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
## 2017-01-25 NOTE — Plan of Care (Signed)
Problem: Health Behavior/Discharge Planning: Goal: Ability to manage health-related needs will improve Outcome: Not Progressing Patient's level of depression and withdrawal makes it difficult to do meaningful education.
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
## 2017-01-25 NOTE — Progress Notes (Signed)
Patient remains sad and depressed. Pt requires complete care with ADLs. incont of B&B. Showered, gown and linen changed. tol po bedtime medication well. Safety maintained with q 15 min checks.

## 2017-01-25 NOTE — Plan of Care (Signed)
Problem: Safety: Goal: Ability to remain free from injury will improve Outcome: Progressing Pt continues to remain free from injury/falls this shift. Pt is IVC with sitter at bedside. Will continue to monitor.  Problem: Pain Managment: Goal: General experience of comfort will improve Outcome: Progressing No complaints of pain this shift

## 2017-01-25 NOTE — Progress Notes (Signed)
PT Cancellation Note  Patient Details Name: Kyle [name] MRN: [id number] DOB: Apr 19, 1946   Cancelled Treatment:    Reason Eval/Treat Not Completed: Patient declined, no reason specified   Pt encouraged to participate in session this am but declined.     Danielle DessSarah Tauno Falotico 01/25/2017, 12:03 PM

## 2017-01-25 NOTE — Tx Team (Signed)
Initial Treatment Plan 01/25/2017 4:54 PM [name] ZOX:[id number]    PATIENT STRESSORS: Health problems Marital or family conflict Medication change or noncompliance   PATIENT STRENGTHS: Ability for insight Active sense of humor Average or above average intelligence Communication skills Supportive family/friends   PATIENT IDENTIFIED PROBLEMS: Suicidal 01/25/17  Depression 01/25/17                   DISCHARGE CRITERIA:  Ability to meet basic life and health needs Adequate post-discharge living arrangements Improved stabilization in mood, thinking, and/or behavior Medical problems require only outpatient monitoring  PRELIMINARY DISCHARGE PLAN: Outpatient therapy Return to previous living arrangement  PATIENT/FAMILY INVOLVEMENT: This treatment plan has been presented to and reviewed with the patient, [name], and/or family member,  .  The patient and family have been given the opportunity to ask questions and make suggestions.  Crist InfanteGwen A Shiasia Porro, RN 01/25/2017, 4:54 PM
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
## 2017-01-25 NOTE — BHH Suicide Risk Assessment (Signed)
[facility] Admission Suicide Risk Assessment   Nursing information obtained from:   review of nursing notes from hospitalization Demographic factors:   71 year old man with bipolar disorder living alone with a past history of multiple suicide attempts Current Mental Status:   depressed and very withdrawn. Nearly catatonic. Little interaction. Continues with suicidal ideation Loss Factors:   patient has very limited family contact. Has feelings of loss for almost everything. 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 Bipolar depression (HCC) [F31.30] 01/25/2017  . Atrial tachycardia (HCC) [I47.1]   . Acute kidney injury (HCC) [N17.9]   . Suicide attempt [T14.91XA]   . Leukocytosis [D72.829]   . Aspiration into airway [T17.908A]   . SVT (supraventricular tachycardia) (HCC) [I47.1]   . Sepsis (HCC) [A41.9] 01/22/2017  . Bipolar disorder, now depressed (HCC) [F31.30] 10/15/2015  . Noncompliance [Z91.19] 09/26/2015  . Ventricular tachycardia (HCC) [I47.2] 09/25/2015  . UTI (urinary tract infection) [N39.0] 09/06/2015  . RLS (restless legs syndrome) [G25.81] 08/08/2015  . Peripheral neuropathy (HCC) [G62.9] 08/08/2015  . Cerebrovascular disease [I67.9] 08/07/2015  . 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 Essential tremor [G25.0] 08/01/2015  . Hypertension [I10] 04/17/2015   Subjective Data: Patient is in the hospital after a serious suicide attempt. Has a history of several other suicide attempts. Continues to have complaints of depression and suicidal ideation  Continued Clinical Symptoms:    The "Alcohol Use Disorders Identification Test", Guidelines for Use in Primary Care, Second  Edition.  World Science writer [facility]). Score between 0-7:  no or low risk or alcohol related problems. Score between 8-15:  moderate risk of alcohol related problems. Score between 16-19:  high risk of alcohol related problems. Score 20 or above:  warrants further diagnostic evaluation for alcohol dependence and treatment.   CLINICAL FACTORS:   Bipolar Disorder:   Depressive phase   Musculoskeletal: Strength & Muscle Tone: within normal limits Gait & Station: shuffle Patient leans: N/A  Psychiatric Specialty Exam: Physical Exam  Nursing note and vitals reviewed. Constitutional: He appears well-developed and well-nourished.  HENT:  Head: Normocephalic and atraumatic.  Eyes: Conjunctivae are normal. Pupils are equal, round, and reactive to light.  Neck: Normal range of motion.  Cardiovascular: Regular rhythm and normal heart sounds.   Respiratory: Effort normal. No respiratory distress.  GI: Soft. There is tenderness.  Musculoskeletal: Normal range of motion.  Neurological: He is alert.  Skin: Skin is warm and dry.  Psychiatric: His speech is delayed and tangential. He is slowed and withdrawn. Cognition and memory are impaired. He expresses inappropriate judgment. He exhibits a depressed mood. He expresses suicidal ideation.    Review of Systems  Constitutional: Negative.   HENT: Negative.   Eyes: Negative.   Respiratory: Negative.   Cardiovascular: Negative.   Gastrointestinal: Positive for abdominal pain.  Musculoskeletal: Negative.   Skin: Negative.   Neurological: Negative.   Psychiatric/Behavioral: Positive for depression and suicidal ideas. Negative for hallucinations, memory loss and substance abuse. The patient is not nervous/anxious and does not have insomnia.     There were no vitals taken for this visit.There is no height or weight on file to calculate BMI.  General Appearance: Casual  Eye Contact:  Minimal  Speech:  Slow  Volume:  Decreased  Mood:   Depressed and Dysphoric  Affect:  Depressed and Flat  Thought Process:  Linear  Orientation:  Full (Time, Place, and Person)  Thought Content:  Illogical  Suicidal Thoughts:  Yes.  with intent/plan  Homicidal Thoughts:  No  Memory:  Immediate;   Negative Recent;   Negative Remote;   Negative  Judgement:  Impaired  Insight:  Shallow  Psychomotor Activity:  Decreased  Concentration:  Concentration: Poor  Recall:  Poor  Fund of Knowledge:  Good  Language:  Good  Akathisia:  No  Handed:  Right  AIMS (if indicated):     Assets:  Housing  ADL's:  Impaired  Cognition:  Impaired,  Moderate  Sleep:         COGNITIVE FEATURES THAT CONTRIBUTE TO RISK:  Closed-mindedness    SUICIDE RISK:   Severe:  Frequent, intense, and enduring suicidal ideation, specific plan, no subjective intent, but some objective markers of intent (i.e., choice of lethal method), the method is accessible, some limited preparatory behavior, evidence of impaired self-control, severe dysphoria/symptomatology, multiple risk factors present, and few if any protective factors, particularly a lack of social support.  PLAN OF CARE: Patient has been admitted to the psychiatric service. Nursing downstairs is familiar with his psychiatric history and behavior and can perform an evaluation as to the need for closer observation. He has been restarted at this time on Seroquel. Treatment team downstairs can work on a further specific plan for treatment of bipolar depression.  I certify that inpatient services furnished can reasonably be expected to improve the patient's condition.   Mordecai RasmussenJohn Hutchinson Isenberg, MD 01/25/2017, 4:06 PM

## 2017-01-26 ENCOUNTER — Inpatient Hospital Stay
Admission: AD | Admit: 2017-01-26 | Discharge: 2017-02-03 | DRG: 885 | Disposition: A | Discharge status: Other Acute Inpatient Hospital | Payer: Medicare Other | Source: Ambulatory Visit | Attending: Specialist | Admitting: Specialist

## 2017-01-26 ENCOUNTER — Inpatient Hospital Stay: Payer: Medicare Other

## 2017-01-26 ENCOUNTER — Encounter: Payer: Self-pay | Admitting: Psychiatry

## 2017-01-26 DIAGNOSIS — Z9119 Patient's noncompliance with other medical treatment and regimen: Secondary | ICD-10-CM

## 2017-01-26 DIAGNOSIS — Z7984 Long term (current) use of oral hypoglycemic drugs: Secondary | ICD-10-CM

## 2017-01-26 DIAGNOSIS — R338 Other retention of urine: Secondary | ICD-10-CM | POA: Diagnosis present

## 2017-01-26 DIAGNOSIS — F314 Bipolar disorder, current episode depressed, severe, without psychotic features: Principal | ICD-10-CM | POA: Diagnosis present

## 2017-01-26 DIAGNOSIS — F3130 Bipolar disorder, current episode depressed, mild or moderate severity, unspecified: Secondary | ICD-10-CM | POA: Diagnosis present

## 2017-01-26 DIAGNOSIS — Z8673 Personal history of transient ischemic attack (TIA), and cerebral infarction without residual deficits: Secondary | ICD-10-CM

## 2017-01-26 DIAGNOSIS — Z915 Personal history of self-harm: Secondary | ICD-10-CM

## 2017-01-26 DIAGNOSIS — E785 Hyperlipidemia, unspecified: Secondary | ICD-10-CM | POA: Diagnosis present

## 2017-01-26 DIAGNOSIS — I10 Essential (primary) hypertension: Secondary | ICD-10-CM | POA: Diagnosis present

## 2017-01-26 DIAGNOSIS — F061 Catatonic disorder due to known physiological condition: Secondary | ICD-10-CM

## 2017-01-26 DIAGNOSIS — E669 Obesity, unspecified: Secondary | ICD-10-CM | POA: Diagnosis present

## 2017-01-26 DIAGNOSIS — I679 Cerebrovascular disease, unspecified: Secondary | ICD-10-CM | POA: Diagnosis present

## 2017-01-26 DIAGNOSIS — J690 Pneumonitis due to inhalation of food and vomit: Secondary | ICD-10-CM | POA: Diagnosis present

## 2017-01-26 DIAGNOSIS — Z79899 Other long term (current) drug therapy: Secondary | ICD-10-CM

## 2017-01-26 DIAGNOSIS — G250 Essential tremor: Secondary | ICD-10-CM | POA: Diagnosis present

## 2017-01-26 DIAGNOSIS — Z7982 Long term (current) use of aspirin: Secondary | ICD-10-CM | POA: Diagnosis not present

## 2017-01-26 DIAGNOSIS — N401 Enlarged prostate with lower urinary tract symptoms: Secondary | ICD-10-CM | POA: Diagnosis present

## 2017-01-26 DIAGNOSIS — I471 Supraventricular tachycardia: Secondary | ICD-10-CM | POA: Diagnosis present

## 2017-01-26 DIAGNOSIS — Z6828 Body mass index (BMI) 28.0-28.9, adult: Secondary | ICD-10-CM

## 2017-01-26 DIAGNOSIS — G9340 Encephalopathy, unspecified: Secondary | ICD-10-CM | POA: Diagnosis present

## 2017-01-26 DIAGNOSIS — E119 Type 2 diabetes mellitus without complications: Secondary | ICD-10-CM

## 2017-01-26 DIAGNOSIS — T1491XA Suicide attempt, initial encounter: Secondary | ICD-10-CM | POA: Diagnosis present

## 2017-01-26 LAB — BLOOD GAS, ARTERIAL
ACID-BASE EXCESS: 3.5 mmol/L — AB (ref 0.0–2.0)
BICARBONATE: 24.7 mmol/L (ref 20.0–28.0)
FIO2: 0.21
O2 Saturation: 97.1 %
PH ART: 7.57 — AB (ref 7.350–7.450)
PO2 ART: 78 mmHg — AB (ref 83.0–108.0)
Patient temperature: 37
pCO2 arterial: 27 mmHg — ABNORMAL LOW (ref 32.0–48.0)

## 2017-01-26 LAB — COMPREHENSIVE METABOLIC PANEL
ALT: 22 U/L (ref 17–63)
AST: 23 U/L (ref 15–41)
Albumin: 3.5 g/dL (ref 3.5–5.0)
Alkaline Phosphatase: 69 U/L (ref 38–126)
Anion gap: 10 (ref 5–15)
BILIRUBIN TOTAL: 0.7 mg/dL (ref 0.3–1.2)
BUN: 6 mg/dL (ref 6–20)
CO2: 23 mmol/L (ref 22–32)
Calcium: 9.1 mg/dL (ref 8.9–10.3)
Chloride: 107 mmol/L (ref 101–111)
Creatinine, Ser: 0.57 mg/dL — ABNORMAL LOW (ref 0.61–1.24)
Glucose, Bld: 87 mg/dL (ref 65–99)
POTASSIUM: 3.7 mmol/L (ref 3.5–5.1)
Sodium: 140 mmol/L (ref 135–145)
TOTAL PROTEIN: 7 g/dL (ref 6.5–8.1)

## 2017-01-26 LAB — CBC WITH DIFFERENTIAL/PLATELET
BASOS ABS: 0 10*3/uL (ref 0–0.1)
Basophils Relative: 0 %
EOS PCT: 3 %
Eosinophils Absolute: 0.2 10*3/uL (ref 0–0.7)
HEMATOCRIT: 37.7 % — AB (ref 40.0–52.0)
Hemoglobin: 13.5 g/dL (ref 13.0–18.0)
LYMPHS PCT: 16 %
Lymphs Abs: 1.3 10*3/uL (ref 1.0–3.6)
MCH: 33.2 pg (ref 26.0–34.0)
MCHC: 35.8 g/dL (ref 32.0–36.0)
MCV: 92.7 fL (ref 80.0–100.0)
MONO ABS: 0.6 10*3/uL (ref 0.2–1.0)
Monocytes Relative: 7 %
NEUTROS ABS: 6.2 10*3/uL (ref 1.4–6.5)
Neutrophils Relative %: 74 %
PLATELETS: 269 10*3/uL (ref 150–440)
RBC: 4.07 MIL/uL — ABNORMAL LOW (ref 4.40–5.90)
RDW: 12.7 % (ref 11.5–14.5)
WBC: 8.4 10*3/uL (ref 3.8–10.6)

## 2017-01-26 LAB — BILIRUBIN, DIRECT: BILIRUBIN DIRECT: 0.1 mg/dL (ref 0.1–0.5)

## 2017-01-26 LAB — GLUCOSE, CAPILLARY
GLUCOSE-CAPILLARY: 81 mg/dL (ref 65–99)
GLUCOSE-CAPILLARY: 71 mg/dL (ref 65–99)
Glucose-Capillary: 96 mg/dL (ref 65–99)
Glucose-Capillary: 73 mg/dL (ref 65–99)

## 2017-01-26 LAB — MRSA PCR SCREENING: MRSA BY PCR: NEGATIVE

## 2017-01-26 LAB — TROPONIN I

## 2017-01-26 MED ORDER — INSULIN ASPART 100 UNIT/ML ~~LOC~~ SOLN: 0.0000 [IU] | Freq: Every day | SUBCUTANEOUS | Status: DC

## 2017-01-26 MED ORDER — SODIUM CHLORIDE 0.9% FLUSH
3.0000 mL | Freq: Two times a day (BID) | INTRAVENOUS | Status: DC
  Administered 2017-01-26 – 2017-01-27 (×3): 3 mL via INTRAVENOUS
  Administered 2017-01-27: 10 mL via INTRAVENOUS
  Administered 2017-01-28 – 2017-02-03 (×9): 3 mL via INTRAVENOUS

## 2017-01-26 MED ORDER — OLANZAPINE 10 MG PO TBDP
10.0000 mg | ORAL_TABLET | Freq: Every day | ORAL | Status: DC
  Administered 2017-01-26 – 2017-02-02 (×6): 10 mg via ORAL
  Filled 2017-01-26 (×3): qty 1
  Filled 2017-01-26 (×2): qty 2
  Filled 2017-01-26 (×4): qty 1

## 2017-01-26 MED ORDER — AMOXICILLIN-POT CLAVULANATE 875-125 MG PO TABS
1.0000 | ORAL_TABLET | Freq: Two times a day (BID) | ORAL | Status: DC
  Administered 2017-01-26 – 2017-01-29 (×5): 1 via ORAL
  Filled 2017-01-26 (×7): qty 1

## 2017-01-26 MED ORDER — INSULIN ASPART 100 UNIT/ML ~~LOC~~ SOLN
0.0000 [IU] | Freq: Three times a day (TID) | SUBCUTANEOUS | Status: DC
  Administered 2017-01-30: 2 [IU] via SUBCUTANEOUS
  Administered 2017-01-31: 1 [IU] via SUBCUTANEOUS
  Administered 2017-01-31: 3 [IU] via SUBCUTANEOUS
  Administered 2017-02-02: 1 [IU] via SUBCUTANEOUS
  Filled 2017-01-26: qty 1
  Filled 2017-01-26: qty 3
  Filled 2017-01-26: qty 1
  Filled 2017-01-26: qty 2

## 2017-01-26 MED ORDER — SIMVASTATIN 20 MG PO TABS
10.0000 mg | ORAL_TABLET | Freq: Every day | ORAL | Status: DC
  Administered 2017-01-26 – 2017-02-02 (×7): 10 mg via ORAL
  Filled 2017-01-26 (×7): qty 1

## 2017-01-26 MED ORDER — ENSURE ENLIVE PO LIQD
237.0000 mL | Freq: Three times a day (TID) | ORAL | Status: DC
  Administered 2017-01-26 – 2017-02-02 (×7): 237 mL via ORAL

## 2017-01-26 MED ORDER — ENOXAPARIN SODIUM 40 MG/0.4ML ~~LOC~~ SOLN
40.0000 mg | SUBCUTANEOUS | Status: DC
  Administered 2017-01-26 – 2017-02-02 (×7): 40 mg via SUBCUTANEOUS
  Filled 2017-01-26 (×8): qty 0.4

## 2017-01-26 MED ORDER — TAMSULOSIN HCL 0.4 MG PO CAPS
0.4000 mg | ORAL_CAPSULE | Freq: Every day | ORAL | Status: DC
  Administered 2017-01-26 – 2017-02-03 (×8): 0.4 mg via ORAL
  Filled 2017-01-26 (×8): qty 1

## 2017-01-26 MED ORDER — METOPROLOL TARTRATE 25 MG PO TABS
25.0000 mg | ORAL_TABLET | Freq: Two times a day (BID) | ORAL | Status: DC
Start: 2017-01-26 — End: 2017-01-27
  Administered 2017-01-26 – 2017-01-27 (×2): 25 mg via ORAL
  Filled 2017-01-26 (×2): qty 1

## 2017-01-26 NOTE — Progress Notes (Signed)
Rapid response called on pt by Behavioral Med Nursing staff at  [phone] as pt is obtunded and does not respond to painful stimulation.  Upon arrival to pt's room, pt remains obtunded, does not withdraw to painful stimuli.  Pupils are pinpoint, and pt has upward gaze.  Pt is maintaining his airway.  Pt is tachycardic, HR 135, BP 115/72, 99% SpO2 on room air, RR 18.  Pt has not had any medications besides Seroquel last night 3/5 at bedtime, has received no medications this morning.  Called and spoke with Hospitalist Dr. Juliene PinaMody, updated her on pt status, and need for her to come and assess pt.  Dr. Juliene PinaMody arrived at beside, orders for STAT ABG, EKG, and Head CT.  [facility]inaMody.  Will transport pt to CT then up to Stepdown/ICU unit.
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
## 2017-01-26 NOTE — Progress Notes (Signed)
Patient ID: Kyle [name], male   DOB: [birth year], 71 y.o.   MRN: [id number] No assessment as Pt was on unit less than 24 hours before being re-admitted to medical unit after a Rapid Response call.  Jake SharkSara Anel Purohit, MSW, LCSW
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
## 2017-01-26 NOTE — BHH Suicide Risk Assessment (Signed)
[facility]   Principal Problem: Bipolar I disorder depressed with melancholic features [facility](HCC) Discharge Diagnoses:  Patient Active Problem List   Diagnosis Date Noted  . Bipolar depression (HCC) [F31.30] 01/25/2017  . Atrial tachycardia (HCC) [I47.1]   . Acute kidney injury (HCC) [N17.9]   . Suicide attempt [T14.91XA]   . Leukocytosis [D72.829]   . Aspiration into airway [T17.908A]   . SVT (supraventricular tachycardia) (HCC) [I47.1]   . Sepsis (HCC) [A41.9] 01/22/2017  . Bipolar disorder, now depressed (HCC) [F31.30] 10/15/2015  . Noncompliance [Z91.19] 09/26/2015  . Ventricular tachycardia (HCC) [I47.2] 09/25/2015  . UTI (urinary tract infection) [N39.0] 09/06/2015  . RLS (restless legs syndrome) [G25.81] 08/08/2015  . Peripheral neuropathy (HCC) [G62.9] 08/08/2015  . Cerebrovascular disease [I67.9] 08/07/2015  . 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 Essential tremor [G25.0] 08/01/2015  . Hypertension [I10] 04/17/2015      Psychiatric Specialty Exam: ROS  Blood pressure 132/83, pulse (!) 129, temperature 98 F (36.7 C), temperature source Oral, resp. rate 18, height 5\' 8"  (1.727 m), weight 81.6 kg (180 lb), SpO2 97 %.Body mass index is 27.37 kg/m.  General Appearanc                                                     Mental Status Per Nursing Assessment::   On Admission:      Urget transfer to medical floor for AMS [facility]    Jimmy FootmanHernandez-Gonzalez,  Amelio Brosky, MD 01/26/2017, 9:28 AM
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
## 2017-01-26 NOTE — Progress Notes (Signed)
Patient ID: Kyle [name], male   DOB: [birth year], 71 y.o.   MRN: [id number] Called by Dr Ardyth HarpsHernandez for a consult Since Dr Juliene PinaMody saw the patient yesterday, I spoke with her too see the patient. Dr Renae GlossWieting

## 2017-01-26 NOTE — [facility] Suicide Risk Assessment (Signed)
[facility]   Nursing information obtained from:    Demographic factors:    Current Mental Status:    Loss Factors:    Historical Factors:    Risk Reduction Factors:     Total Time spent with patient: 1 hour Principal Problem: Bipolar I disorder depressed with melancholic features [facility](HCC) Diagnosis:   Patient Active Problem List   Diagnosis Date Noted  . Suicide attempt [T14.91XA]   . SVT (supraventricular tachycardia) (HCC) [I47.1]   . Noncompliance [Z91.19] 09/26/2015  . RLS (restless legs syndrome) [G25.81] 08/08/2015  . Peripheral neuropathy (HCC) [G62.9] 08/08/2015  . Cerebrovascular disease [I67.9] 08/07/2015  . 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 Essential tremor [G25.0] 08/01/2015  . Hypertension [I10] 04/17/2015   Subjective Data:   Continued Clinical Symptoms:  Alcohol Use Disorder Identification Test Final Score (AUDIT): 0 The "Alcohol Use Disorders Identification Test", Guidelines for Use in Primary Care, Second Edition.  World Science writerHealth Organization [facility](WHO). Score between 0-7:  no or low risk or alcohol related problems. Score between 8-15:  moderate risk of alcohol related problems. Score between 16-19:  high risk of alcohol related problems. Score 20 or above:  warrants further diagnostic evaluation for alcohol dependence and treatment.    Psychiatric Specialty Exam: Physical Exam  ROS  Blood pressure 132/83, pulse (!) 129, temperature 98 F (36.7 C), temperature source Oral, resp. rate 18, height 5\' 8"  (1.727 m), weight 81.6 kg (180 lb), SpO2 97 %.Body mass index is 27.37 kg/m.                                                    Sleep:  Number of Hours: 8.3      COGNITIVE FEATURES THAT CONTRIBUTE TO RISK:  Loss of executive function    SUICIDE RISK:   Severe:  Frequent, intense, and enduring suicidal ideation, specific plan, no subjective intent, but some  objective markers of intent (i.e., choice of lethal method), the method is accessible, some limited preparatory behavior, evidence of impaired self-control, severe dysphoria/symptomatology, multiple risk factors present, and few if any protective factors, particularly a lack of social support.  PLAN OF CARE[facility]  I certify that inpatient services furnished can reasonably be expected to improve the patient's condition.   Jimmy FootmanHernandez-Gonzalez,  Susen Haskew, MD 01/26/2017, 12:39 PM

## 2017-01-26 NOTE — Plan of Care (Signed)
Problem: Safety: Goal: Ability to remain free from injury will improve Outcome: Not Applicable Date Met: 27/51/70 Patient sad and depressed. Isolative. Med compliant. No PRNs given. Safety maintained with q 15 min checks.
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
## 2017-01-26 NOTE — Progress Notes (Signed)
Pt arrived on the unit as a rapid response from behavioral health. Staff stated pt was unresponsive and catatonic with pin-point pupils. Upon pt's arrival on unit, pt noted to be responsive and following commands. Although, pt was non-verbal, pt is communicating by nodding "yes" or "no" and is able to answer yes and no questions. Pt noted to follow commands such as "roll towards me" and "lift your hips so I can move this pad". Pt noted to have a suicide sitter with him. Will continue to assess and monitor for any changes.
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
## 2017-01-26 NOTE — Progress Notes (Signed)
  [facility]  Pt discharged to medical floor after Rapid response called.  Pt admitted back to medical floor.  Glennon MacSara P Antonea Gaut, MSW, LCSW 01/26/2017, 4:57 PM

## 2017-01-27 ENCOUNTER — Inpatient Hospital Stay: Payer: Medicare Other

## 2017-01-27 DIAGNOSIS — F3130 Bipolar disorder, current episode depressed, mild or moderate severity, unspecified: Secondary | ICD-10-CM

## 2017-01-27 DIAGNOSIS — F061 Catatonic disorder due to known physiological condition: Secondary | ICD-10-CM

## 2017-01-27 LAB — BASIC METABOLIC PANEL
Anion gap: 11 (ref 5–15)
BUN: 13 mg/dL (ref 6–20)
CALCIUM: 8.8 mg/dL — AB (ref 8.9–10.3)
CHLORIDE: 105 mmol/L (ref 101–111)
CO2: 20 mmol/L — AB (ref 22–32)
CREATININE: 0.66 mg/dL (ref 0.61–1.24)
GFR calc non Af Amer: >60 mL/min (ref >60)
Glucose, Bld: 92 mg/dL (ref 65–99)
Potassium: 3.5 mmol/L (ref 3.5–5.1)
Sodium: 136 mmol/L (ref 135–145)

## 2017-01-27 LAB — CULTURE, BLOOD (ROUTINE X 2)
CULTURE: NO GROWTH
Culture: NO GROWTH

## 2017-01-27 LAB — GLUCOSE, CAPILLARY
GLUCOSE-CAPILLARY: 92 mg/dL (ref 65–99)
Glucose-Capillary: 94 mg/dL (ref 65–99)
Glucose-Capillary: 101 mg/dL — ABNORMAL HIGH (ref 65–99)
Glucose-Capillary: 97 mg/dL (ref 65–99)

## 2017-01-27 LAB — TROPONIN I: Troponin I: <0.03 ng/mL (ref <0.03)

## 2017-01-27 LAB — CBC
HCT: 35.4 % — ABNORMAL LOW (ref 40.0–52.0)
Hemoglobin: 12.8 g/dL — ABNORMAL LOW (ref 13.0–18.0)
MCH: 33.6 pg (ref 26.0–34.0)
MCHC: 36 g/dL (ref 32.0–36.0)
MCV: 93.4 fL (ref 80.0–100.0)
PLATELETS: 260 10*3/uL (ref 150–440)
RBC: 3.79 MIL/uL — AB (ref 4.40–5.90)
RDW: 12.4 % (ref 11.5–14.5)
WBC: 10 10*3/uL (ref 3.8–10.6)

## 2017-01-27 LAB — TSH: TSH: 1.025 u[IU]/mL (ref 0.350–4.500)

## 2017-01-27 MED ORDER — METOPROLOL TARTRATE 50 MG PO TABS
50.0000 mg | ORAL_TABLET | Freq: Two times a day (BID) | ORAL | Status: DC
  Administered 2017-01-28 – 2017-02-03 (×12): 50 mg via ORAL
  Filled 2017-01-27 (×13): qty 1

## 2017-01-27 MED ORDER — ALUM & MAG HYDROXIDE-SIMETH 200-200-20 MG/5ML PO SUSP
30.0000 mL | Freq: Four times a day (QID) | ORAL | Status: DC | PRN
  Administered 2017-01-27: 30 mL via ORAL
  Filled 2017-01-27: qty 30

## 2017-01-27 MED ORDER — PANTOPRAZOLE SODIUM 40 MG PO TBEC
40.0000 mg | DELAYED_RELEASE_TABLET | Freq: Every day | ORAL | Status: DC
  Administered 2017-01-27 – 2017-02-03 (×8): 40 mg via ORAL
  Filled 2017-01-27 (×8): qty 1

## 2017-01-27 MED ORDER — METOPROLOL TARTRATE 5 MG/5ML IV SOLN
INTRAVENOUS | Status: AC
  Administered 2017-01-27: 5 mg via INTRAVENOUS
  Filled 2017-01-27: qty 5

## 2017-01-27 MED ORDER — FINASTERIDE 5 MG PO TABS
5.0000 mg | ORAL_TABLET | Freq: Every day | ORAL | Status: DC
  Administered 2017-01-27 – 2017-02-03 (×7): 5 mg via ORAL
  Filled 2017-01-27 (×7): qty 1

## 2017-01-27 MED ORDER — METOPROLOL TARTRATE 5 MG/5ML IV SOLN
5.0000 mg | INTRAVENOUS | Status: DC
  Administered 2017-01-27: 5 mg via INTRAVENOUS

## 2017-01-27 MED ORDER — METOPROLOL TARTRATE 5 MG/5ML IV SOLN: 5.0000 mg | Freq: Four times a day (QID) | INTRAVENOUS | Status: DC | PRN

## 2017-01-27 NOTE — Progress Notes (Signed)
Order received from Dr Sung AmabileSimonds for foley cath, will discuss retention with Dr Juliene PinaMody, 1600 ccs out within 40 minutes of insertion

## 2017-01-27 NOTE — Progress Notes (Signed)
DC transfer order per Dr Elpidio AnisSudini, pt having runs of tach in 140s

## 2017-01-27 NOTE — Progress Notes (Signed)
Contacted Dr mody re burning sensation in esophagus and stomach.  She will place orders

## 2017-01-27 NOTE — Progress Notes (Signed)
[facility] Physician-Brief Progress Note Patient Name: Kyle JacquetMarvin D [name] DOB: 1946-09-10 MRN: [id number]   Date of Service  01/27/2017  HPI/Events of Note  tchy  r/o re entry svt?  BB increase oral Add Iv  tsh  eICU Interventions       Intervention Category Major Interventions: Arrhythmia - evaluation and management  [name]. 01/27/2017, 6:21 PM

## 2017-01-27 NOTE — Clinical Social Work Note (Signed)
CSW consulted by ICU nurse due to patient being an intentional overdose. Upon further review, patient was transferred to ICU from Behavioral Medicine after a rapid response was called. Psychiatry is following patient closely. York SpanielMonica Rhylan Gross MSW,LCSW [phone]

## 2017-01-27 NOTE — Progress Notes (Signed)
Per Dr Juliene PinaMody, leave foley in place for now.  [facility]urg [facility].  We will start finasteride

## 2017-01-27 NOTE — Progress Notes (Signed)
Contacted [facility] re SVT in 150s.  Acknowledged, will contact MD

## 2017-01-27 NOTE — Progress Notes (Signed)
Contacted [facility] in [place] re home meds.  [facility] as pharmacist verbalized to me

## 2017-01-27 NOTE — Progress Notes (Signed)
Patient refused all medications for 2200. Patient was offered milk, juice, water, and ensure denied all. When approached about medication again be states "no deal". This nurse attempted to talk with patient about medication refusal and his condition patient answers vaguely stating things like "what do you think" or "it's not possible". Patient spoken to about the importance of medication compliance and adherence with his diagnosis of bipolar disorder. Patient remains flat and withdrawn. Dr Sheryle Hailiamond aware of patient's refusal of mediation and fluids.

## 2017-01-27 NOTE — Progress Notes (Signed)
Pt with very little output overnite, and 0 in condom cath bag when writer assessed this AM.  Bladder scans reveals 950+ ccs in bladder.  Pt encouraged to try to urinate, he has had his flomax.  He was unable.  Dr Sung AmabileSimonds was in another room with a family having a discussion.  Foley inserted with qualified tech at bedside d/t writer's concern about the amount of urine in patient's bladder.  Will ask Dr Sung AmabileSimonds for an order
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
## 2017-01-28 LAB — BASIC METABOLIC PANEL
Anion gap: 14 (ref 5–15)
BUN: 10 mg/dL (ref 6–20)
CHLORIDE: 101 mmol/L (ref 101–111)
CO2: 20 mmol/L — ABNORMAL LOW (ref 22–32)
Calcium: 9.9 mg/dL (ref 8.9–10.3)
Creatinine, Ser: 0.73 mg/dL (ref 0.61–1.24)
GFR calc Af Amer: >60 mL/min (ref >60)
GFR calc non Af Amer: >60 mL/min (ref >60)
Glucose, Bld: 102 mg/dL — ABNORMAL HIGH (ref 65–99)
Potassium: 3.8 mmol/L (ref 3.5–5.1)
SODIUM: 135 mmol/L (ref 135–145)

## 2017-01-28 LAB — GLUCOSE, CAPILLARY
GLUCOSE-CAPILLARY: 92 mg/dL (ref 65–99)
GLUCOSE-CAPILLARY: 115 mg/dL — AB (ref 65–99)
Glucose-Capillary: 103 mg/dL — ABNORMAL HIGH (ref 65–99)
Glucose-Capillary: 91 mg/dL (ref 65–99)

## 2017-01-28 MED ORDER — LORAZEPAM 2 MG/ML IJ SOLN
2.0000 mg | INTRAMUSCULAR | Status: AC
  Administered 2017-01-28 – 2017-01-29 (×4): 2 mg via INTRAMUSCULAR
  Filled 2017-01-28 (×4): qty 1
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
## 2017-01-28 NOTE — Progress Notes (Signed)
Pt being transferred to room 142. Report called to Raynelle FanningJulie, Charity fundraiserN. Pt and belongings transferred to room 142 without incident.

## 2017-01-28 NOTE — Progress Notes (Signed)
Pt has arrived from ccu at approx 1210. Sitter at bedside. This Clinical research associatewriter introduced herself and pt made no reply. Pt lungs are clear, Hr is regular. Skin check completed by this nurse and Marijean NiemannJaime, Rn. Pt has a few scratches to his R forehead, healing abrasion to his r knee, yellowing bruise to his RUA, scabs intact to top of l hand and some bruising to top of r hand. Per sitter, on arrival, pt spit out two pills he had been holding in his mouth. One pill white and elongated, one small white and round. Pt is grinding his teeth continually.ppp, no edema noted. Foam patch placed to pt's sacrum. Pt did reply to confirm his name when dietary arrived with his lunch tray. Mitts at bedside.

## 2017-01-29 LAB — GLUCOSE, CAPILLARY
GLUCOSE-CAPILLARY: 88 mg/dL (ref 65–99)
GLUCOSE-CAPILLARY: 109 mg/dL — AB (ref 65–99)
GLUCOSE-CAPILLARY: 110 mg/dL — AB (ref 65–99)
Glucose-Capillary: 124 mg/dL — ABNORMAL HIGH (ref 65–99)

## 2017-01-29 MED ORDER — LORAZEPAM 1 MG PO TABS
1.0000 mg | ORAL_TABLET | Freq: Four times a day (QID) | ORAL | Status: DC
  Administered 2017-01-29 – 2017-02-01 (×11): 1 mg via ORAL
  Filled 2017-01-29 (×11): qty 1

## 2017-01-29 NOTE — Progress Notes (Signed)
Patient was transferred from Behavioral Medicine to ICU and then to 1A. Per MD note ECT is planned to start on Monday. Clinical Child psychotherapistocial Worker (CSW) attempted to meet with patient however he was confused and talking about catching rabbits. Patient has a sitter at bedside and is trying to get his gloves off. CSW will continue to follow and assist as needed.   Baker Hughes IncorporatedBailey Fallon Haecker, LCSW [phone]
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
## 2017-01-29 NOTE — Progress Notes (Signed)
  Chaplain rounding the unit was asked to visit the Pt. [place] met with the Pt who was with the Nurse Assistant in the Rm. Pt recognized the [facility] and welcomed him. 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 Pt stated he loves eating seafood at [place] and also mentioned that the food in hospital was not good. Pt was animated and in good spirit. Pt joked a lot and laughed about his own jokes with the Chaplain. Compared to visit the [facility] made with the Pt yesterday, Pt appeared to have improved a lot and got back to where he was iwhen the [facility] first met the Pt in the ICU. [place] observes that though the Pt had shown signs of being delusion, Pt knew what he was doing. Pt selected who to talk to and who not to talk to when it was convenient for him. Pt requested prayers, which the Chaplain offered. Pt told Chaplain that he might be going home on Monday. [place] encouraged Pt and informed Pt that Ch services are available as needed.    01/29/17 1600  Clinical Encounter Type  Visited With Patient;Health care provider  Visit Type Follow-up;Psychological support;Spiritual support  Referral From Nurse  Consult/Referral To Chaplain  Spiritual Encounters  Spiritual Needs Prayer;Emotional;Other (Comment)
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
## 2017-01-29 NOTE — Progress Notes (Signed)
[facility] [place] [facility]   PATIENT NAME: Kyle [name]    MR#:  [id number]  DATE OF BIRTH:  11/13/1946  SUBJECTIVE:  Patient with delirius this am saying he killed 3 rabbits this am and he is grabbing at his gloves.  REVIEW OF SYSTEMS:    Review of Systems  Psychiatric/Behavioral: Positive for hallucinations.   delirius  Tolerating Diet: yes     DRUG ALLERGIES:  No Known Allergies  VITALS:  Blood pressure (!) 143/72, pulse 85, temperature 98.8 F (37.1 C), temperature source Oral, resp. rate 16, height 5\' 10"  (1.778 m), weight 89.3 kg (196 lb 13.9 oz), SpO2 95 %.  PHYSICAL EXAMINATION:   Physical Exam  Constitutional: He is well-developed, well-nourished, and in no distress. No distress.  HENT:  Head: Normocephalic.  Eyes: No scleral icterus.  Neck: Normal range of motion. Neck supple. No JVD present. No tracheal deviation present.  Cardiovascular: Normal rate, regular rhythm and normal heart sounds.  Exam reveals no gallop and no friction rub.   No murmur heard. Pulmonary/Chest: Effort normal and breath sounds normal. No respiratory distress. He has no wheezes. He has no rales. He exhibits no tenderness.  Abdominal: Soft. Bowel sounds are normal. He exhibits no distension and no mass. There is no tenderness. There is no rebound and no guarding.  Musculoskeletal: Normal range of motion. He exhibits no edema.  Has mits   Neurological: He is alert.  Skin: Skin is warm. No rash noted. No erythema.  Psychiatric:  delirius      LABORATORY PANEL:   CBC  Recent Labs Lab 01/27/17 0554  WBC 10.0  HGB 12.8*  HCT 35.4*  PLT 260   ------------------------------------------------------------------------------------------------------------------  Chemistries   Recent Labs Lab 01/24/17 0530 01/26/17 1126  01/28/17 0530  NA 138 140  < > 135  K 3.0* 3.7  < > 3.8  CL 108 107  < > 101  CO2 24 23  < > 20*  GLUCOSE 84 87  < > 102*   BUN 17 6  < > 10  CREATININE 0.78 0.57*  < > 0.73  CALCIUM 8.3* 9.1  < > 9.9  MG 2.3  --   --   --   AST  --  23  --   --   ALT  --  22  --   --   ALKPHOS  --  69  --   --   BILITOT  --  0.7  --   --   < > = values in this interval not displayed. ------------------------------------------------------------------------------------------------------------------  Cardiac Enzymes  Recent Labs Lab 01/26/17 1126 01/26/17 1715 01/26/17 2337  TROPONINI <0.03 <0.03 <0.03   ------------------------------------------------------------------------------------------------------------------  RADIOLOGY:  No results found.   ASSESSMENT AND PLAN:   71 year old male with severe depression who was discharged yesterday from medical service after hospitalization for attempted suicide with ingestion of gasoline and vinegar and was medically clear and found this morning and behavior health with altered mental status/obtundation.  1. Acute encephalopathy/obtunded with delirium from depression:  Patient needs treatment for his depression and will start ECT on Monday. D/w Dr Toni Amendlapacs.   2. Recent aspiration pneumonia: Completed course of Augmentin.  3. Severe depression: Psych consult appreciated. Continue sitter ECT planned for Monday  4. BPH with Urinary retention: Continue Flomax and finasteride.    5. History of SVT: Continue metoprolol       Management plans discussed with the patient  CODE STATUS: full  TOTAL  TIME TAKING CARE OF THIS PATIENT: 22 minutes.     POSSIBLE D/C ??, DEPENDING ON CLINICAL CONDITION.   Kyle [name] M.D on 01/29/2017 at 9:12 AM  Between 7am to 6pm - Pager - [phone] After 6pm go to [email or web address] - Social research officer, government  [facility]  Office  [phone]  CC: Primary care physician; No PCP Per Patient  Note: This dictation was prepared with Dragon dictation along with smaller phrase technology. Any transcriptional errors that  result from this process are unintentional.

## 2017-01-29 NOTE — Plan of Care (Signed)
Problem: Safety: Goal: Ability to remain free from injury will improve Outcome: Progressing Patient has 1:1 sitter

## 2017-01-29 NOTE — Progress Notes (Signed)
Patient has been calm this morning so removed mittens. Will have to put on if patient becomes agitated. Sitter said she would advise nurse if needed.

## 2017-01-30 LAB — GLUCOSE, CAPILLARY
GLUCOSE-CAPILLARY: 116 mg/dL — AB (ref 65–99)
GLUCOSE-CAPILLARY: 155 mg/dL — AB (ref 65–99)
GLUCOSE-CAPILLARY: 120 mg/dL — AB (ref 65–99)
Glucose-Capillary: 120 mg/dL — ABNORMAL HIGH (ref 65–99)
Glucose-Capillary: 188 mg/dL — ABNORMAL HIGH (ref 65–99)

## 2017-01-30 NOTE — Progress Notes (Signed)
PT Cancellation Note  Patient Details Name: Kyle [name] MRN: [id number] DOB: [birth year]   Cancelled Treatment:    Reason Eval/Treat Not Completed: Patient's level of consciousness (Engaged patient with gentle rubbing of hand and simple conversation. Pt asleep, remains somnolent, eyes closed, with intermittnent verbal response, short and slurred as if sleeping. ) Will attempt PT evaluation again at later date/time as appropriate.    10:20 AM, 01/30/17 Rosamaria LintsAllan C Chaysen Tillman, PT, DPT Physical Therapist - [place] [phone](ASCOM)  [phone] (mobile)
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
## 2017-01-31 LAB — GLUCOSE, CAPILLARY
GLUCOSE-CAPILLARY: 121 mg/dL — AB (ref 65–99)
Glucose-Capillary: 113 mg/dL — ABNORMAL HIGH (ref 65–99)
Glucose-Capillary: 145 mg/dL — ABNORMAL HIGH (ref 65–99)
Glucose-Capillary: 220 mg/dL — ABNORMAL HIGH (ref 65–99)

## 2017-01-31 MED ORDER — ENSURE ENLIVE PO LIQD: 237.0000 mL | Freq: Three times a day (TID) | ORAL | 12 refills | Status: DC

## 2017-01-31 MED ORDER — SIMVASTATIN 10 MG PO TABS: 10.0000 mg | ORAL_TABLET | Freq: Every day | ORAL | 0 refills | Status: DC

## 2017-01-31 MED ORDER — OLANZAPINE 10 MG PO TBDP: 10.0000 mg | ORAL_TABLET | Freq: Every day | ORAL | 0 refills | Status: DC

## 2017-01-31 MED ORDER — FINASTERIDE 5 MG PO TABS: 5.0000 mg | ORAL_TABLET | Freq: Every day | ORAL | 0 refills | Status: DC

## 2017-01-31 MED ORDER — TAMSULOSIN HCL 0.4 MG PO CAPS: 0.4000 mg | ORAL_CAPSULE | Freq: Every day | ORAL | 0 refills | Status: DC

## 2017-01-31 NOTE — Care Management Note (Signed)
Case Management Note  Patient Details  Name: Kyle [name] MRN: [id number] Date of Birth: 1946/05/20  Subjective/Objective:       Discussed discharge planning with Reeves DamJadie, RN. Per Dr Camillia HerterMody's discharge order, [name] [facility] However, [name] prior to his being transferred. Discharge is currently pending an evaluation by a psychiatrist.              Action/Plan:   Expected Discharge Date:  01/31/17               Expected Discharge Plan:     In-House Referral:     Discharge planning Services     Post Acute Care Choice:    Choice offered to:     DME Arranged:    DME Agency:     HH Arranged:    HH Agency:     Status of Service:     If discussed at [employer] Meetings, dates discussed:    Additional Comments:  Velta Rockholt A, RN 01/31/2017, 12:10 PM

## 2017-01-31 NOTE — Progress Notes (Signed)
ECT Monday Morning: Per Clydie BraunKaren  Do not give insulin Give Protonix and Metoprolol Do not give any other meds.
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
## 2017-01-31 NOTE — Progress Notes (Signed)
Per Dr. Juliene PinaMody. [facility]. DC the discharge.

## 2017-01-31 NOTE — Discharge Summary (Signed)
[facility] - [place] at [facility]   PATIENT NAME: Kyle LennoxMarvin [name]    MR#:  [id number]  DATE OF BIRTH:  17-Mar-1946  DATE OF ADMISSION:  01/26/2017 ADMITTING PHYSICIAN: Adrian SaranSital Aaralynn Shepheard, MD  DATE OF DISCHARGE: No discharge date for patient encounter.  PRIMARY CARE PHYSICIAN: No PCP Per Patient    ADMISSION DIAGNOSIS:  AMS  DISCHARGE DIAGNOSIS:  Principal Problem:   Bipolar I disorder depressed with melancholic features (HCC) Active Problems:   Hypertension   Essential tremor   Cerebrovascular disease   Suicide attempt   Catatonia   Diabetes (HCC)   SECONDARY DIAGNOSIS:   Past Medical History:  Diagnosis Date  . Bipolar 1 disorder (HCC)   . BPH (benign prostatic hyperplasia)   . Cerebrovascular disease   . HTN   . Hyperlipidemia   . Neuropathy (HCC)   . SVT (supraventricular tachycardia) (HCC)   . 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 Type 2 diabetes mellitus [facility](HCC)     HOSPITAL COURSE:   71 year old male with severe depression who was discharged yesterday from medical service after hospitalization for attempted suicide with ingestion of gasoline and vinegar and was medically clear and found this morning and behavior health with altered mental status/obtundation.  1. Acute encephalopathy/obtunded with delirium from depression/catatonia:  Patient needs treatment for his depression and will start ECT on Monday.    2. Recent aspiration pneumonia: Completed course of Augmentin.  3. Severe depression: Psych consultappreciated. Continue sitter ECT planned for Monday  4. BPH with Urinary retention: Continue Flomax and finasteride.    5. History of SVT: Continue metoprolol   DISCHARGE CONDITIONS AND DIET:   Stable Regular diet  CONSULTS OBTAINED:  Treatment Team:  Audery AmelJohn T Clapacs, MD  DRUG ALLERGIES:  No Known Allergies  DISCHARGE MEDICATIONS:   Current Discharge Medication List    START taking these medications   Details   feeding supplement, ENSURE ENLIVE, (ENSURE ENLIVE) LIQD Take 237 mLs by mouth 3 (three) times daily with meals. Qty: 237 mL, Refills: 12    finasteride (PROSCAR) 5 MG tablet Take 1 tablet (5 mg total) by mouth daily. Qty: 30 tablet, Refills: 0    OLANZapine zydis (ZYPREXA) 10 MG disintegrating tablet Take 1 tablet (10 mg total) by mouth at bedtime. Qty: 30 tablet, Refills: 0    simvastatin (ZOCOR) 10 MG tablet Take 1 tablet (10 mg total) by mouth daily at 6 PM. Qty: 30 tablet, Refills: 0    tamsulosin (FLOMAX) 0.4 MG CAPS capsule Take 1 capsule (0.4 mg total) by mouth daily. Qty: 30 capsule, Refills: 0      CONTINUE these medications which have NOT CHANGED   Details  aspirin 81 MG chewable tablet Chew by mouth daily.    metoprolol tartrate (LOPRESSOR) 25 MG tablet Take 25 mg by mouth 2 (two) times daily.      STOP taking these medications     amLODipine (NORVASC) 10 MG tablet      amoxicillin-clavulanate (AUGMENTIN) 500-125 MG tablet      QUEtiapine (SEROQUEL) 25 MG tablet      sertraline (ZOLOFT) 50 MG tablet           Today   CHIEF COMPLAINT:  Eating breakfast ambulated with nurse and PT this am   VITAL SIGNS:  Blood pressure (!) 126/55, pulse 65, temperature 98 F (36.7 C), temperature source Axillary, resp. rate 18, height 5\' 10"  (1.778 m), weight 89.3 kg (196 lb 13.9 oz), SpO2 94 %.  REVIEW OF SYSTEMS:  Review of Systems  Constitutional: Negative.  Negative for chills, fever and malaise/fatigue.  HENT: Negative.  Negative for ear discharge, ear pain, hearing loss, nosebleeds and sore throat.   Eyes: Negative.  Negative for blurred vision and pain.  Respiratory: Negative.  Negative for cough, hemoptysis, shortness of breath and wheezing.   Cardiovascular: Negative.  Negative for chest pain, palpitations and leg swelling.  Gastrointestinal: Negative.  Negative for abdominal pain, blood in stool, diarrhea, nausea and vomiting.  Genitourinary: Negative.   Negative for dysuria.  Musculoskeletal: Negative.  Negative for back pain.  Skin: Negative.   Neurological: Negative for dizziness, tremors, speech change, focal weakness, seizures and headaches.  Endo/Heme/Allergies: Negative.  Does not bruise/bleed easily.  Psychiatric/Behavioral: Positive for depression. Negative for hallucinations and suicidal ideas.     PHYSICAL EXAMINATION:  GENERAL:  71 y.o.-year-old patient lying in the bed with no acute distress.  NECK:  Supple, no jugular venous distention. No thyroid enlargement, no tenderness.  LUNGS: Normal breath sounds bilaterally, no wheezing, rales,rhonchi  No use of accessory muscles of respiration.  CARDIOVASCULAR: S1, S2 normal. No murmurs, rubs, or gallops.  ABDOMEN: Soft, non-tender, non-distended. Bowel sounds present. No organomegaly or mass.  EXTREMITIES: No pedal edema, cyanosis, or clubbing.  PSYCHIATRIC: The patient is alert and oriented x 3. Depressed flat affect SKIN: No obvious rash, lesion, or ulcer.   DATA REVIEW:   CBC  Recent Labs Lab 01/27/17 0554  WBC 10.0  HGB 12.8*  HCT 35.4*  PLT 260    Chemistries   Recent Labs Lab 01/26/17 1126  01/28/17 0530  NA 140  < > 135  K 3.7  < > 3.8  CL 107  < > 101  CO2 23  < > 20*  GLUCOSE 87  < > 102*  BUN 6  < > 10  CREATININE 0.57*  < > 0.73  CALCIUM 9.1  < > 9.9  AST 23  --   --   ALT 22  --   --   ALKPHOS 69  --   --   BILITOT 0.7  --   --   < > = values in this interval not displayed.  Cardiac Enzymes  Recent Labs Lab 01/26/17 1126 01/26/17 1715 01/26/17 2337  TROPONINI <0.03 <0.03 <0.03    Microbiology Results  @MICRORSLT48 @  RADIOLOGY:  No results found.    Current Discharge Medication List    START taking these medications   Details  feeding supplement, ENSURE ENLIVE, (ENSURE ENLIVE) LIQD Take 237 mLs by mouth 3 (three) times daily with meals. Qty: 237 mL, Refills: 12    finasteride (PROSCAR) 5 MG tablet Take 1 tablet (5 mg  total) by mouth daily. Qty: 30 tablet, Refills: 0    OLANZapine zydis (ZYPREXA) 10 MG disintegrating tablet Take 1 tablet (10 mg total) by mouth at bedtime. Qty: 30 tablet, Refills: 0    simvastatin (ZOCOR) 10 MG tablet Take 1 tablet (10 mg total) by mouth daily at 6 PM. Qty: 30 tablet, Refills: 0    tamsulosin (FLOMAX) 0.4 MG CAPS capsule Take 1 capsule (0.4 mg total) by mouth daily. Qty: 30 capsule, Refills: 0      CONTINUE these medications which have NOT CHANGED   Details  aspirin 81 MG chewable tablet Chew by mouth daily.    metoprolol tartrate (LOPRESSOR) 25 MG tablet Take 25 mg by mouth 2 (two) times daily.      STOP taking these medications  amLODipine (NORVASC) 10 MG tablet      amoxicillin-clavulanate (AUGMENTIN) 500-125 MG tablet      QUEtiapine (SEROQUEL) 25 MG tablet      sertraline (ZOLOFT) 50 MG tablet            Management plans discussed with the patient and he is in agreement. Stable for discharge   Patient should follow up with psychiatry  CODE STATUS:     Code Status Orders        Start     Ordered   01/26/17 1106  Full code  Continuous     01/26/17 1106    Code Status History    Date Active Date Inactive Code Status Order ID Comments User Context   01/25/2017  3:46 PM 01/26/2017  9:40 AM Full Code [id number]  Audery Amel, MD Inpatient   01/23/2017  1:02 AM 01/25/2017  3:26 PM Full Code [id number]  Tonye Royalty, DO Inpatient   10/15/2015  2:30 AM 10/22/2015  7:45 PM Full Code [id number]  Audery Amel, MD Inpatient   09/25/2015  5:29 PM 10/15/2015  2:30 AM Full Code [id number]  Auburn Bilberry, MD Inpatient   09/06/2015  4:36 PM 09/20/2015  5:36 PM Full Code [id number]  Audery Amel, MD Inpatient   07/31/2015 10:28 PM 08/19/2015  8:06 PM Full Code [id number]  Audery Amel, MD Inpatient   04/18/2015  5:35 AM 04/30/2015  5:48 PM Full Code [id number]  Audery Amel, MD Inpatient      TOTAL TIME TAKING CARE OF THIS PATIENT: 37 minutes.     Note: This dictation was prepared with Dragon dictation along with smaller phrase technology. Any transcriptional errors that result from this process are unintentional.  Larine Fielding M.D on 01/31/2017 at 10:39 AM  Between 7am to 6pm - Pager - [phone] After 6pm go to [email or web address] - Social research officer, government  [facility]  Office  [phone]  CC: Primary care physician; No PCP Per Patient

## 2017-01-31 NOTE — Evaluation (Signed)
Physical Therapy Evaluation Patient Details Name: Kyle JacquetMarvin D Velie MRN: [id number] DOB: [birth year] Today's Date: 01/31/2017   History of Present Illness  Pt initially admitted to hospital for sepsis secondary to suicide attempt at drinking gasoline (under IVC).  Pt then transferred to behavioral medicine (BM).  Pt found obtunded in room in behavioral health and transferred back to medical service 01/26/17 with acute encephalopathy/obtunded d/t catatonic depression.  PMH includes bipolar, HTN, DM neuropathy, and essential tremors. Per notes, plan to dc back to BM unit once medically stable.  Clinical Impression  Prior to hospital admission, pt reports being independent.  Currently pt is SBA supine to sit; min assist but improved to CGA with transfers; and CGA (occasional min assist with distance/fatigue) ambulating around nursing loop with RW.  Pt would benefit from skilled PT to address noted impairments and functional limitations.  Recommend pt discharge to home with 24/7 assist for safety and HHPT when medically appropriate.    Follow Up Recommendations Supervision/Assistance - 24 hour;Home health PT    Equipment Recommendations  Rolling walker with 5" wheels    Recommendations for Other Services       Precautions / Restrictions Precautions Precautions: Fall Restrictions Weight Bearing Restrictions: No      Mobility  Bed Mobility Overal bed mobility: Needs Assistance Bed Mobility: Supine to Sit     Supine to sit: Supervision;HOB elevated     General bed mobility comments: no difficulty sitting up  Transfers Overall transfer level: Needs assistance Equipment used: Rolling walker (2 wheeled) Transfers: Sit to/from Stand Sit to Stand: Min guard;Min assist         General transfer comment: 1st trial standing min assist to steady but pt CGA 2nd trial standing with RW  Ambulation/Gait Ambulation/Gait assistance: Min guard;Min assist Ambulation Distance (Feet): 200  Feet Assistive device: Rolling walker (2 wheeled)   Gait velocity: mildly decreased   General Gait Details: step through pattern; mild increased lateral sway (occasional very min assist to steady with distance but mostly CGA); chair follow for safety  Stairs            Wheelchair Mobility    Modified Rankin (Stroke Patients Only)       Balance Overall balance assessment: Needs assistance Sitting-balance support: Bilateral upper extremity supported;Feet supported Sitting balance-Leahy Scale: Good Sitting balance - Comments: sitting reaching within BOS   Standing balance support: Bilateral upper extremity supported (on RW) Standing balance-Leahy Scale: Fair                               Pertinent Vitals/Pain Pain Assessment: Faces Faces Pain Scale: Hurts little more Pain Location: body in general (from sleeping) Pain Descriptors / Indicators: Sore Pain Intervention(s): Limited activity within patient's tolerance;Monitored during session;Repositioned  HR increased to 144 bpm with activity (nursing notified); O2 WFL on room air during session.    Home Living Family/patient expects to be discharged to:: Private residence Living Arrangements: Alone               Additional Comments: Pt did not answer questions regarding home/prior function except that he was independent.    Prior Function Level of Independence: Independent               Hand Dominance        Extremity/Trunk Assessment   Upper Extremity Assessment Upper Extremity Assessment: Generalized weakness    Lower Extremity Assessment Lower Extremity Assessment: Generalized weakness  Communication   Communication:  (Not very talkative during session.)  Cognition Arousal/Alertness:  (Initially sleeping but woke up with vc's and activity) Behavior During Therapy: Flat affect Overall Cognitive Status: No family/caregiver present to determine baseline cognitive functioning                  General Comments: Oriented to person (pt did not answer other cognitve status questions)    General Comments General comments (skin integrity, edema, etc.): Sitter present entire session.  Nursing cleared pt for participation in physical therapy.  Pt agreeable to PT session.    Exercises     Assessment/Plan    PT Assessment Patient needs continued PT services  PT Problem List Decreased strength;Decreased balance;Decreased mobility;Decreased cognition;Decreased knowledge of use of DME       PT Treatment Interventions Gait training;DME instruction;Therapeutic activities;Therapeutic exercise;Stair training;Functional mobility training;Balance training;Patient/family education    PT Goals (Current goals can be found in the Care Plan section)  Acute Rehab PT Goals Patient Stated Goal: to get stronger PT Goal Formulation: With patient Time For Goal Achievement: 02/14/17 Potential to Achieve Goals: Good    Frequency Min 2X/week   Barriers to discharge Inaccessible home environment;Decreased caregiver support      Co-evaluation               End of Session Equipment Utilized During Treatment: Gait belt Activity Tolerance: Patient tolerated treatment well Patient left: in chair;with call bell/phone within reach;with nursing/sitter in room Nurse Communication: Mobility status;Precautions PT Visit Diagnosis: Unsteadiness on feet (R26.81);Muscle weakness (generalized) (M62.81);Difficulty in walking, not elsewhere classified (R26.2)         Time: 1610-9604 PT Time Calculation (min) (ACUTE ONLY): 18 min   Charges:   PT Evaluation $PT Eval Low Complexity: 1 Procedure PT Treatments $Therapeutic Activity: 8-22 mins   PT G CodesHendricks Limes, PT 01/31/17, 11:09 AM [phone]

## 2017-02-01 ENCOUNTER — Inpatient Hospital Stay: Payer: Medicare Other | Admitting: Anesthesiology

## 2017-02-01 ENCOUNTER — Encounter: Payer: Self-pay | Admitting: Anesthesiology

## 2017-02-01 ENCOUNTER — Other Ambulatory Visit: Payer: Self-pay | Admitting: Psychiatry

## 2017-02-01 LAB — GLUCOSE, CAPILLARY
GLUCOSE-CAPILLARY: 90 mg/dL (ref 65–99)
Glucose-Capillary: 107 mg/dL — ABNORMAL HIGH (ref 65–99)
Glucose-Capillary: 94 mg/dL (ref 65–99)
Glucose-Capillary: 113 mg/dL — ABNORMAL HIGH (ref 65–99)

## 2017-02-01 MED ORDER — FENTANYL CITRATE (PF) 100 MCG/2ML IJ SOLN: 25.0000 ug | INTRAMUSCULAR | Status: DC | PRN

## 2017-02-01 MED ORDER — METHOHEXITAL SODIUM 100 MG/10ML IV SOSY
PREFILLED_SYRINGE | INTRAVENOUS | Status: DC | PRN
  Administered 2017-02-01: 100 mg via INTRAVENOUS

## 2017-02-01 MED ORDER — SODIUM CHLORIDE 0.9 % IV SOLN
500.0000 mL | Freq: Once | INTRAVENOUS | Status: AC
  Administered 2017-02-01: 1000 mL via INTRAVENOUS

## 2017-02-01 MED ORDER — SUCCINYLCHOLINE CHLORIDE 20 MG/ML IJ SOLN
INTRAMUSCULAR | Status: DC | PRN
  Administered 2017-02-01: 120 mg via INTRAVENOUS

## 2017-02-01 MED ORDER — SODIUM CHLORIDE 0.9 % IV SOLN
INTRAVENOUS | Status: DC | PRN
  Administered 2017-02-01: 11:00:00 via INTRAVENOUS

## 2017-02-01 MED ORDER — LORAZEPAM 0.5 MG PO TABS
0.5000 mg | ORAL_TABLET | Freq: Four times a day (QID) | ORAL | Status: DC
  Administered 2017-02-02 – 2017-02-03 (×7): 0.5 mg via ORAL
  Filled 2017-02-01 (×8): qty 1

## 2017-02-01 MED ORDER — ONDANSETRON HCL 4 MG/2ML IJ SOLN: 4.0000 mg | Freq: Once | INTRAMUSCULAR | Status: DC | PRN

## 2017-02-01 NOTE — Progress Notes (Signed)
Spoke with Clide CliffMargaret Williams this morning. She stated that patient did not have a Chief Technology Officerower of Attorney. The sister stated she is giving verbal consent to this staff to perform ECT on her brother. The sister stated that if the patient refused treatment she wanted the staff to respect his decision. This nurse verbalized understanding and communicated this request with Dr. Toni Amendlapacs. The verbal consent was witnessed by Karlene LinemanPatrice Justice Aguirre Rn and Royston Cowpereresa Willis RN. The sister was given the ECT number [phone] if she had question regarding the treatment. She verbalized understanding.

## 2017-02-01 NOTE — Progress Notes (Signed)
[facility] - [place] at [facility]   PATIENT NAME: Kyle [name]    MR#:  [id number]  DATE OF BIRTH:  October 27, 1946  SUBJECTIVE:   S/p ECT today.  Ambulated well with PT yesterday.  No other acute events overnight.   REVIEW OF SYSTEMS:    Review of Systems  Constitutional: Negative.  Negative for chills, fever and malaise/fatigue.  HENT: Negative.  Negative for ear discharge, ear pain, hearing loss, nosebleeds and sore throat.   Eyes: Negative.  Negative for blurred vision and pain.  Respiratory: Negative.  Negative for cough, hemoptysis, shortness of breath and wheezing.   Cardiovascular: Negative.  Negative for chest pain, palpitations and leg swelling.  Gastrointestinal: Negative.  Negative for abdominal pain, blood in stool, diarrhea, nausea and vomiting.  Genitourinary: Negative.  Negative for dysuria.  Musculoskeletal: Negative.  Negative for back pain.  Skin: Negative.   Neurological: Negative for dizziness, tremors, speech change, focal weakness, seizures and headaches.  Endo/Heme/Allergies: Negative.  Does not bruise/bleed easily.  Psychiatric/Behavioral: Positive for depression. Negative for hallucinations and suicidal ideas.   delirius  Tolerating Diet: yes     DRUG ALLERGIES:  No Known Allergies  VITALS:  Blood pressure (!) 114/59, pulse 64, temperature 98.7 F (37.1 C), temperature source Oral, resp. rate 16, height 5\' 10"  (1.778 m), weight 88.1 kg (194 lb 3 oz), SpO2 99 %.  PHYSICAL EXAMINATION:   Physical Exam  Constitutional: He is well-developed, well-nourished, and in no distress. No distress.  HENT:  Head: Normocephalic.  Eyes: No scleral icterus.  Neck: Normal range of motion. Neck supple. No JVD present. No tracheal deviation present.  Cardiovascular: Normal rate, regular rhythm and normal heart sounds.  Exam reveals no gallop and no friction rub.   No murmur heard. Pulmonary/Chest: Effort normal and breath sounds normal. No  respiratory distress. He has no wheezes. He has no rales. He exhibits no tenderness.  Abdominal: Soft. Bowel sounds are normal. He exhibits no distension and no mass. There is no tenderness. There is no rebound and no guarding.  Musculoskeletal: Normal range of motion. He exhibits no edema.  Neurological: He is alert.  Says a few words   Skin: Skin is warm. No rash noted. No erythema.  Psychiatric:  Depressed Flat affect      LABORATORY PANEL:   CBC  Recent Labs Lab 01/27/17 0554  WBC 10.0  HGB 12.8*  HCT 35.4*  PLT 260   ------------------------------------------------------------------------------------------------------------------  Chemistries   Recent Labs Lab 01/26/17 1126  01/28/17 0530  NA 140  < > 135  K 3.7  < > 3.8  CL 107  < > 101  CO2 23  < > 20*  GLUCOSE 87  < > 102*  BUN 6  < > 10  CREATININE 0.57*  < > 0.73  CALCIUM 9.1  < > 9.9  AST 23  --   --   ALT 22  --   --   ALKPHOS 69  --   --   BILITOT 0.7  --   --   < > = values in this interval not displayed. ------------------------------------------------------------------------------------------------------------------  Cardiac Enzymes  Recent Labs Lab 01/26/17 1126 01/26/17 1715 01/26/17 2337  TROPONINI <0.03 <0.03 <0.03   ------------------------------------------------------------------------------------------------------------------  RADIOLOGY:  No results found.   ASSESSMENT AND PLAN:   71 year old male with severe depression who was discharged yesterday from medical service after hospitalization for attempted suicide with ingestion of gasoline and vinegar and was medically clear and found this  morning and behavior health with altered mental status/obtundation.  1. Acute encephalopathy/obtunded with delirium from depression:  - followed by Pscyh and started on ECT today.  - He is medically clear for discharge down to behavioral health. D/w Kyle [name].  2. Recent aspiration  pneumonia: Completed course of Augmentin.  3. Severe depression: Corporate investment bankerContinue sitter - ECT started today.   4. BPH with Urinary retention: Continue Flomax and finasteride.   5. History of SVT: Continue metoprolol   Stable to be discharged to behavioral medicine.     Management plans discussed with the patient   CODE STATUS: full  TOTAL TIME TAKING CARE OF THIS PATIENT: 25 minutes.    POSSIBLE D/C to behavioral medicine today if bed available   Kyle [name] M.D on 02/01/2017 at 1:32 PM  Between 7am to 6pm - Pager - [phone]  After 6pm go to [email or web address] - Social research officer, governmentpassword EPAS [email or web address]  [facility]  Office  [phone]  CC: Primary care physician; No PCP Per Patient  Note: This dictation was prepared with Dragon dictation along with smaller phrase technology. Any transcriptional errors that result from this process are unintentional.
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
## 2017-02-01 NOTE — Anesthesia Preprocedure Evaluation (Signed)
Anesthesia Evaluation  Patient identified by MRN, date of birth, ID band Patient awake    Reviewed: Allergy & Precautions, NPO status , Patient's Chart, lab work & pertinent test results, reviewed documented beta blocker date and time   Airway Mallampati: III  TM Distance: >3 FB Neck ROM: Limited    Dental  (+) Teeth Intact   Pulmonary neg pulmonary ROS,    Pulmonary exam normal        Cardiovascular Exercise Tolerance: Poor hypertension, Pt. on medications and Pt. on home beta blockers Normal cardiovascular exam     Neuro/Psych PSYCHIATRIC DISORDERS Depression Bipolar Disorder  Neuromuscular disease    GI/Hepatic   Endo/Other  diabetes  Renal/GU      Musculoskeletal   Abdominal (+) + obese,  Abdomen: soft.    Peds  Hematology   Anesthesia Other Findings   Reproductive/Obstetrics                             Anesthesia Physical  Anesthesia Plan  ASA: III  Anesthesia Plan: General   Post-op Pain Management:    Induction: Intravenous  Airway Management Planned: Mask  Additional Equipment:   Intra-op Plan:   Post-operative Plan:   Informed Consent: I have reviewed the patients History and Physical, chart, labs and discussed the procedure including the risks, benefits and alternatives for the proposed anesthesia with the patient or authorized representative who has indicated his/her understanding and acceptance.     Plan Discussed with: CRNA  Anesthesia Plan Comments:         Anesthesia Quick Evaluation
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
## 2017-02-01 NOTE — Anesthesia Post-op Follow-up Note (Cosign Needed)
Anesthesia QCDR form completed.        

## 2017-02-01 NOTE — Transfer of Care (Signed)
Immediate Anesthesia Transfer of Care Note  Patient: Kyle [name]  Procedure(s) Performed: * No procedures listed *  Patient Location: PACU  Anesthesia Type:General  Level of Consciousness: sedated  Airway & Oxygen Therapy: Patient Spontanous Breathing and Patient connected to face mask oxygen  Post-op Assessment: Report given to RN and Post -op Vital signs reviewed and stable  Post vital signs: Reviewed and stable  Last Vitals:  Vitals:   02/01/17 1004 02/01/17 1126  BP: 115/69 126/61  Pulse: (!) 59 60  Resp: 16 14  Temp: 36.8 C 36.6 C    Last Pain:  Vitals:   02/01/17 1004  TempSrc: Oral  PainSc:          Complications: No apparent anesthesia complications

## 2017-02-01 NOTE — Procedures (Signed)
ECT SERVICES Physician's Interval Evaluation & Treatment Note  Patient Identification: Kyle [name] MRN:  [id number] Date of Evaluation:  02/01/2017 TX #: 1  MADRS: 34  MMSE: 25  P.E. Findings:  Lungs and heart clear. Vitals unremarkable. Patient is weak and unstable on his feet  Psychiatric Interval Note:  Subjectively depressed very withdrawn flat and 8 confused with hallucinations  Subjective:  Patient is a 71 y.o. male seen for evaluation for Electroconvulsive Therapy. Depressed  Treatment Summary:   [x]   Right Unilateral             []  Bilateral   % Energy : 0.3 ms 70%   Impedance: 1170 ohms  Seizure Energy Index: 3944 V squared  Postictal Suppression Index: 24%  Seizure Concordance Index: 46%  Medications  Pre Shock: Brevital 100 mg succinylcholine 100 mg  Post Shock:    Seizure Duration: 9 seconds EMG possibly 21 seconds EEG   Comments: Up to 100% next time and I will check medication to see if there is anything we can do to improve seizures   Lungs:  [x]   Clear to auscultation               []  Other:   Heart:    [x]   Regular rhythm             []  irregular rhythm    [x]   Previous H&P reviewed, patient examined and there are NO CHANGES                 []   Previous H&P reviewed, patient examined and there are changes noted.   Kyle RasmussenJohn Clapacs, MD 3/12/201811:03 AM

## 2017-02-01 NOTE — Anesthesia Postprocedure Evaluation (Signed)
Anesthesia Post Note  Patient: Kyle SprayMarvin D [name]  Procedure(s) Performed: * No procedures listed *  Patient location during evaluation: PACU Anesthesia Type: General Level of consciousness: awake and alert and oriented Pain management: pain level controlled Vital Signs Assessment: post-procedure vital signs reviewed and stable Respiratory status: spontaneous breathing Cardiovascular status: blood pressure returned to baseline Anesthetic complications: no     Last Vitals:  Vitals:   02/01/17 1216 02/01/17 1230  BP: (!) 125/59 (!) 114/59  Pulse: 66 64  Resp: 19 16  Temp:  37.1 C    Last Pain:  Vitals:   02/01/17 1230  TempSrc: Oral  PainSc:                  Jasmene Goswami

## 2017-02-02 LAB — CREATININE, SERUM
CREATININE: 0.61 mg/dL (ref 0.61–1.24)
GFR calc Af Amer: >60 mL/min (ref >60)

## 2017-02-02 LAB — GLUCOSE, CAPILLARY
GLUCOSE-CAPILLARY: 83 mg/dL (ref 65–99)
GLUCOSE-CAPILLARY: 107 mg/dL — AB (ref 65–99)
Glucose-Capillary: 148 mg/dL — ABNORMAL HIGH (ref 65–99)
Glucose-Capillary: 116 mg/dL — ABNORMAL HIGH (ref 65–99)
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
## 2017-02-02 NOTE — Progress Notes (Signed)
Pt is alert and oriented, following commands, communicating and answering all questions. Has demonstrated no impulsive or combative behavior for this RN the past 2 days. Pt has ambulated around nursing station which is ~23400ft x2 today with no assistance using rolling walker. Pt remains continent using toilet independently for bowel and bladder today. Pt is taking all prescribed meds as ordered without issues. [facility]
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
## 2017-02-02 NOTE — Progress Notes (Signed)
Pt alert. Following simple commands and answering questions. Cooperative taking medication without difficulty.
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
## 2017-02-03 ENCOUNTER — Inpatient Hospital Stay: Payer: Medicare Other | Admitting: Anesthesiology

## 2017-02-03 ENCOUNTER — Encounter: Payer: Self-pay | Admitting: *Deleted

## 2017-02-03 ENCOUNTER — Other Ambulatory Visit: Payer: Self-pay | Admitting: Psychiatry

## 2017-02-03 ENCOUNTER — Inpatient Hospital Stay
Admission: EM | Admit: 2017-02-03 | Discharge: 2017-02-12 | DRG: 885 | Disposition: A | Discharge status: Home / Self Care | Payer: Medicare Other | Source: Intra-hospital | Attending: Psychiatry | Admitting: Psychiatry

## 2017-02-03 DIAGNOSIS — F319 Bipolar disorder, unspecified: Secondary | ICD-10-CM | POA: Diagnosis present

## 2017-02-03 DIAGNOSIS — Z9119 Patient's noncompliance with other medical treatment and regimen: Secondary | ICD-10-CM

## 2017-02-03 DIAGNOSIS — R05 Cough: Secondary | ICD-10-CM

## 2017-02-03 DIAGNOSIS — I679 Cerebrovascular disease, unspecified: Secondary | ICD-10-CM | POA: Diagnosis present

## 2017-02-03 DIAGNOSIS — G250 Essential tremor: Secondary | ICD-10-CM | POA: Diagnosis present

## 2017-02-03 DIAGNOSIS — Z79899 Other long term (current) drug therapy: Secondary | ICD-10-CM | POA: Diagnosis not present

## 2017-02-03 DIAGNOSIS — I10 Essential (primary) hypertension: Secondary | ICD-10-CM | POA: Diagnosis present

## 2017-02-03 DIAGNOSIS — R059 Cough, unspecified: Secondary | ICD-10-CM

## 2017-02-03 DIAGNOSIS — E785 Hyperlipidemia, unspecified: Secondary | ICD-10-CM | POA: Diagnosis present

## 2017-02-03 DIAGNOSIS — T520X2A Toxic effect of petroleum products, intentional self-harm, initial encounter: Secondary | ICD-10-CM | POA: Diagnosis present

## 2017-02-03 DIAGNOSIS — N400 Enlarged prostate without lower urinary tract symptoms: Secondary | ICD-10-CM | POA: Diagnosis present

## 2017-02-03 DIAGNOSIS — Z818 Family history of other mental and behavioral disorders: Secondary | ICD-10-CM

## 2017-02-03 DIAGNOSIS — Z7982 Long term (current) use of aspirin: Secondary | ICD-10-CM

## 2017-02-03 DIAGNOSIS — F332 Major depressive disorder, recurrent severe without psychotic features: Principal | ICD-10-CM | POA: Diagnosis present

## 2017-02-03 DIAGNOSIS — Z8679 Personal history of other diseases of the circulatory system: Secondary | ICD-10-CM | POA: Diagnosis not present

## 2017-02-03 DIAGNOSIS — G629 Polyneuropathy, unspecified: Secondary | ICD-10-CM | POA: Diagnosis present

## 2017-02-03 DIAGNOSIS — F061 Catatonic disorder due to known physiological condition: Secondary | ICD-10-CM | POA: Diagnosis present

## 2017-02-03 DIAGNOSIS — E1140 Type 2 diabetes mellitus with diabetic neuropathy, unspecified: Secondary | ICD-10-CM | POA: Diagnosis present

## 2017-02-03 DIAGNOSIS — Z915 Personal history of self-harm: Secondary | ICD-10-CM | POA: Diagnosis not present

## 2017-02-03 DIAGNOSIS — F3130 Bipolar disorder, current episode depressed, mild or moderate severity, unspecified: Secondary | ICD-10-CM | POA: Diagnosis not present

## 2017-02-03 LAB — GLUCOSE, CAPILLARY
Glucose-Capillary: 102 mg/dL — ABNORMAL HIGH (ref 65–99)
Glucose-Capillary: 99 mg/dL (ref 65–99)
Glucose-Capillary: 95 mg/dL (ref 65–99)

## 2017-02-03 MED ORDER — ALUM & MAG HYDROXIDE-SIMETH 200-200-20 MG/5ML PO SUSP: 30.0000 mL | ORAL | Status: DC | PRN

## 2017-02-03 MED ORDER — FINASTERIDE 5 MG PO TABS
5.0000 mg | ORAL_TABLET | Freq: Every day | ORAL | Status: DC
  Administered 2017-02-04 – 2017-02-12 (×9): 5 mg via ORAL
  Filled 2017-02-03 (×9): qty 1

## 2017-02-03 MED ORDER — FENTANYL CITRATE (PF) 100 MCG/2ML IJ SOLN: 25.0000 ug | INTRAMUSCULAR | Status: DC | PRN

## 2017-02-03 MED ORDER — SUCCINYLCHOLINE CHLORIDE 200 MG/10ML IV SOSY
PREFILLED_SYRINGE | INTRAVENOUS | Status: DC | PRN
  Administered 2017-02-03: 120 mg via INTRAVENOUS

## 2017-02-03 MED ORDER — ONDANSETRON HCL 4 MG/2ML IJ SOLN: 4.0000 mg | Freq: Once | INTRAMUSCULAR | Status: DC | PRN

## 2017-02-03 MED ORDER — SIMVASTATIN 10 MG PO TABS
10.0000 mg | ORAL_TABLET | Freq: Every day | ORAL | Status: DC
  Administered 2017-02-04 – 2017-02-12 (×9): 10 mg via ORAL
  Filled 2017-02-03 (×9): qty 1

## 2017-02-03 MED ORDER — METOPROLOL TARTRATE 25 MG PO TABS
25.0000 mg | ORAL_TABLET | Freq: Two times a day (BID) | ORAL | Status: DC
  Administered 2017-02-03 – 2017-02-12 (×18): 25 mg via ORAL
  Filled 2017-02-03 (×20): qty 1

## 2017-02-03 MED ORDER — OLANZAPINE 10 MG PO TABS
10.0000 mg | ORAL_TABLET | Freq: Every day | ORAL | Status: DC
  Administered 2017-02-03: 10 mg via ORAL
  Filled 2017-02-03: qty 1

## 2017-02-03 MED ORDER — ACETAMINOPHEN 325 MG PO TABS
650.0000 mg | ORAL_TABLET | Freq: Four times a day (QID) | ORAL | Status: DC | PRN
  Administered 2017-02-06 – 2017-02-10 (×4): 650 mg via ORAL
  Filled 2017-02-03 (×4): qty 2

## 2017-02-03 MED ORDER — ASPIRIN EC 81 MG PO TBEC
81.0000 mg | DELAYED_RELEASE_TABLET | Freq: Every day | ORAL | Status: DC
  Administered 2017-02-04 – 2017-02-12 (×9): 81 mg via ORAL
  Filled 2017-02-03 (×9): qty 1

## 2017-02-03 MED ORDER — SODIUM CHLORIDE 0.9 % IV SOLN
INTRAVENOUS | Status: DC | PRN
  Administered 2017-02-03: 12:00:00 via INTRAVENOUS

## 2017-02-03 MED ORDER — SUCCINYLCHOLINE CHLORIDE 20 MG/ML IJ SOLN
INTRAMUSCULAR | Status: AC
  Filled 2017-02-03: qty 1

## 2017-02-03 MED ORDER — SODIUM CHLORIDE 0.9 % IV SOLN
500.0000 mL | Freq: Once | INTRAVENOUS | Status: AC
  Administered 2017-02-03: 1000 mL via INTRAVENOUS

## 2017-02-03 MED ORDER — MAGNESIUM HYDROXIDE 400 MG/5ML PO SUSP: 30.0000 mL | Freq: Every day | ORAL | Status: DC | PRN

## 2017-02-03 MED ORDER — METHOHEXITAL SODIUM 100 MG/10ML IV SOSY
PREFILLED_SYRINGE | INTRAVENOUS | Status: DC | PRN
  Administered 2017-02-03: 100 mg via INTRAVENOUS

## 2017-02-03 NOTE — Anesthesia Procedure Notes (Signed)
Date/Time: 02/03/2017 11:39 AM Performed by: Kyle [name], Kyle [name] Pre-anesthesia Checklist: Patient identified, Emergency Drugs available, Suction available and Patient being monitored Patient Re-evaluated:Patient Re-evaluated prior to inductionOxygen Delivery Method: Circle system utilized Preoxygenation: Pre-oxygenation with 100% oxygen Intubation Type: IV induction Ventilation: Mask ventilation without difficulty and Mask ventilation throughout procedure Airway Equipment and Method: Bite block Placement Confirmation: positive ETCO2 Dental Injury: Teeth and Oropharynx as per pre-operative assessment
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
## 2017-02-03 NOTE — Progress Notes (Signed)
Pt being discharged today. He will be going to behavioral medicine. Report was given to Rn, all questions answered. His PIV was removed. Pt IVC remains intact, sitter remains at bedside. Discharge information was reviewed with pt, he verbalized understanding. He is leaving with all of his belongings. He will be transported down with sitter, and staff.

## 2017-02-03 NOTE — Plan of Care (Signed)
Problem: Education: Goal: Knowledge of [facility] Education information/materials will improve Outcome: Progressing Receptive to information received

## 2017-02-03 NOTE — Procedures (Signed)
ECT SERVICES Physician's Interval Evaluation & Treatment Note  Patient Identification: Suezanne JacquetMarvin D Hauk MRN:  [id number] Date of Evaluation:  02/03/2017 TX #: 2  MADRS:   MMSE:   P.E. Findings:  Heart and lungs clear. Vitals unremarkable. No acute injury. No change.  Psychiatric Interval Note:  Flat withdrawn. Not as subjectively depressed. Still has some hallucinations.  Subjective:  Patient is a 71 y.o. male seen for evaluation for Electroconvulsive Therapy. Subjectively a little improved and stronger  Treatment Summary:   [x]   Right Unilateral             []  Bilateral   % Energy : 0.3 ms 100%   Impedance: 1140 ohms  Seizure Energy Index: 3105 V squared  Postictal Suppression Index: 48%  Seizure Concordance Index: 82%  Medications  Pre Shock: Brevital 100 mg, succinylcholine 100 mg  Post Shock:    Seizure Duration: 9 seconds by EMG 27 seconds by EEG   Comments: Not the greatest but not terrible. We will continue with the same numbers and next treatment this Friday.   Lungs:  [x]   Clear to auscultation               []  Other:   Heart:    [x]   Regular rhythm             []  irregular rhythm    [x]   Previous H&P reviewed, patient examined and there are NO CHANGES                 []   Previous H&P reviewed, patient examined and there are changes noted.   Mordecai RasmussenJohn Brenan [place], MD 3/14/201811:34 AM
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
## 2017-02-03 NOTE — Anesthesia Post-op Follow-up Note (Cosign Needed)
Anesthesia QCDR form completed.        
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
## 2017-02-03 NOTE — Anesthesia Postprocedure Evaluation (Signed)
Anesthesia Post Note  Patient: Sheryle SprayMarvin D Laughridge  Procedure(s) Performed: * No procedures listed *  Patient location during evaluation: PACU Anesthesia Type: General Level of consciousness: awake Pain management: pain level controlled Vital Signs Assessment: post-procedure vital signs reviewed and stable Respiratory status: spontaneous breathing Cardiovascular status: stable Anesthetic complications: no     Last Vitals:  Vitals:   02/03/17 1212 02/03/17 1241  BP: 120/78 (!) 135/52  Pulse: 71 64  Resp: 19 18  Temp: 36.7 C 37.3 C    Last Pain:  Vitals:   02/03/17 1241  TempSrc: Oral  PainSc:                  VAN STAVEREN,Tirza Senteno
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
## 2017-02-03 NOTE — Tx Team (Signed)
Initial Treatment Plan 02/03/2017 6:02 PM Kyle [name] [id number]    PATIENT STRESSORS: Health problems Marital or family conflict Medication change or noncompliance   PATIENT STRENGTHS: Ability for insight Capable of independent living Communication skills Supportive family/friends   PATIENT IDENTIFIED PROBLEMS: Depression 02/04/17  Suicidal 02/04/17                   DISCHARGE CRITERIA:  Ability to meet basic life and health needs Improved stabilization in mood, thinking, and/or behavior Medical problems require only outpatient monitoring  PRELIMINARY DISCHARGE PLAN: Outpatient therapy Participate in family therapy Return to previous living arrangement  PATIENT/FAMILY INVOLVEMENT: This treatment plan has been presented to and reviewed with the patient, Kyle [name], and/or family member,  .  The patient and family have been given the opportunity to ask questions and make suggestions.  Crist InfanteGwen A Rieley Hausman, RN 02/03/2017, 6:02 PM

## 2017-02-03 NOTE — Progress Notes (Signed)
While rounding on the unit the Chaplain visited with the Pt. Pt was asleep at the time of the visit, but Chaplain talked with the Nurse Assistant briefly to find out how the Pt was doing and plans to have follow up visit with the Pt. Chaplain's follow up visit will focus on exploring the Pt's mental and emotional stability to determine if the progress the Pt is making is consistent. CH recommends that this Pt might benefit from Chaplain's visit.     02/03/17 1700  Clinical Encounter Type  Visited With Patient;Health care provider  Visit Type Follow-up  Referral From Chaplain  Spiritual Encounters  Spiritual Needs Emotional;Other (Comment)

## 2017-02-03 NOTE — Anesthesia Preprocedure Evaluation (Signed)
Anesthesia Evaluation  Patient identified by MRN, date of birth, ID band Patient awake    Reviewed: Allergy & Precautions, NPO status , Patient's Chart, lab work & pertinent test results  Airway Mallampati: III       Dental  (+) Poor Dentition, Missing   Pulmonary neg pulmonary ROS,     + decreased breath sounds      Cardiovascular Exercise Tolerance: Good hypertension, Pt. on medications and Pt. on home beta blockers  Rhythm:Regular     Neuro/Psych Depression Bipolar Disorder    GI/Hepatic negative GI ROS, Neg liver ROS,   Endo/Other  diabetes, Type obesity  Renal/GU      Musculoskeletal   Abdominal (+) + obese,   Peds negative pediatric ROS (+)  Hematology negative hematology ROS (+)   Anesthesia Other Findings   Reproductive/Obstetrics                             Anesthesia Physical Anesthesia Plan  ASA: III  Anesthesia Plan: General   Post-op Pain Management:    Induction: Intravenous  Airway Management Planned: Mask and Natural Airway  Additional Equipment:   Intra-op Plan:   Post-operative Plan:   Informed Consent: I have reviewed the patients History and Physical, chart, labs and discussed the procedure including the risks, benefits and alternatives for the proposed anesthesia with the patient or authorized representative who has indicated his/her understanding and acceptance.     Plan Discussed with: CRNA  Anesthesia Plan Comments:         Anesthesia Quick Evaluation

## 2017-02-03 NOTE — Transfer of Care (Signed)
Immediate Anesthesia Transfer of Care Note  Patient: Kyle SprayMarvin D [name]  Procedure(s) Performed: ECT  Patient Location: PACU  Anesthesia Type:General  Level of Consciousness: sedated  Airway & Oxygen Therapy: Patient Spontanous Breathing and Patient connected to face mask oxygen  Post-op Assessment: Report given to RN and Post -op Vital signs reviewed and stable  Post vital signs: Reviewed and stable  Last Vitals:  Vitals:   02/03/17 0935 02/03/17 1151  BP: 119/60 (!) 147/70  Pulse: 64 76  Resp: 18 (!) 26  Temp: 36.7 C 36.9 C    Last Pain:  Vitals:   02/03/17 1151  TempSrc:   PainSc: Asleep         Complications: No apparent anesthesia complications
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
## 2017-02-04 LAB — GLUCOSE, CAPILLARY: GLUCOSE-CAPILLARY: 117 mg/dL — AB (ref 65–99)

## 2017-02-04 MED ORDER — HYDROXYZINE HCL 25 MG PO TABS
25.0000 mg | ORAL_TABLET | Freq: Once | ORAL | Status: AC
  Administered 2017-02-04: 25 mg via ORAL
  Filled 2017-02-04: qty 1

## 2017-02-04 MED ORDER — OLANZAPINE 5 MG PO TABS
15.0000 mg | ORAL_TABLET | Freq: Every day | ORAL | Status: DC
  Administered 2017-02-04 – 2017-02-11 (×8): 15 mg via ORAL
  Filled 2017-02-04 (×8): qty 1

## 2017-02-04 MED ORDER — FLUOXETINE HCL 20 MG PO CAPS
20.0000 mg | ORAL_CAPSULE | Freq: Every day | ORAL | Status: DC
  Administered 2017-02-04 – 2017-02-12 (×9): 20 mg via ORAL
  Filled 2017-02-04 (×9): qty 1

## 2017-02-04 NOTE — Plan of Care (Signed)
Problem: Coping: Goal: Ability to verbalize feelings will improve Outcome: Progressing Patient verbalized feelings to staff.    

## 2017-02-04 NOTE — Progress Notes (Signed)
Resting in bed , sitter present at bedside 1:1

## 2017-02-04 NOTE — Progress Notes (Signed)
Patient  Resting in bed sitter at bedside

## 2017-02-04 NOTE — Progress Notes (Signed)
Patient continues to be on 1:1 no distress noted, frequent toileting offered.

## 2017-02-04 NOTE — Progress Notes (Signed)
Patient sitting on side of bed 1:1 present

## 2017-02-04 NOTE — Progress Notes (Addendum)
D: Resting  In bed  Sitter at bedside 1:1

## 2017-02-04 NOTE — Progress Notes (Signed)
Patient on 1:1 no distress noted resting in bed quietly.

## 2017-02-04 NOTE — Progress Notes (Signed)
Patient refused lunch . Remain in bed  1:1 sitter present

## 2017-02-04 NOTE — Progress Notes (Signed)
Recreation Therapy Notes  Date: 03.15.18 Time: 9:30 am Location: Craft Room  Group Topic: Leisure Education  Goal Area(s) Addresses:  Patient will identify activities for each letter of the alphabet. Patient will verbalize ability to integrate positive leisure into life post d/c. Patient will verbalize ability to use leisure as a Associate Professorcoping skill.  Behavioral Response: Did not attend  Intervention: Leisure Alphabet  Activity: Patients were given a Leisure Information systems managerAlphabet worksheet and were instructed to write healthy leisure activities for each letter of the alphabet.  Education: LRT educated patients on what they need to participate in leisure.  Education Outcome: Patient did not attend group.  Clinical Observations/Feedback: Patient did not attend group.  Jacquelynn CreeGreene,Indie Boehne M, LRT/CTRS 02/04/2017 10:02 AM

## 2017-02-04 NOTE — Progress Notes (Signed)
Pt is alert and oriented this evening. Pt mood is sad and his affect is appropriate to circumstance. Pt has no complaints and seems to bed doing well with 1:1 observation. Writer encouraged ambulation and pt acknowledges understating. Pt participating in personal hygiene as well. Writer also explained rationale to sitter for observation. Pt has no complaints at this time. Will continue to monitor.

## 2017-02-04 NOTE — Progress Notes (Signed)
Patient remains in bed   Sitter at bedside .

## 2017-02-04 NOTE — Progress Notes (Signed)
Patient continues to be on 1:1, no distress noted.  

## 2017-02-04 NOTE — Progress Notes (Signed)
Patient on 1:1 no distress noted, resting quietly.
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
## 2017-02-04 NOTE — [facility] Group Notes (Signed)
[facility] LCSW Group Therapy Note  Date/Time: 02/04/17, 1300  Type of Therapy/Topic:  Group Therapy:  Balance in Life  Participation Level:  Pt did not attend group.  Description of Group:    This group will address the concept of balance and how it feels and looks when one is unbalanced. Patients will be encouraged to process areas in their lives that are out of balance, and identify reasons for remaining unbalanced. Facilitators will guide patients utilizing problem- solving interventions to address and correct the stressor making their life unbalanced. Understanding and applying boundaries will be explored and addressed for obtaining  and maintaining a balanced life. Patients will be encouraged to explore ways to assertively make their unbalanced needs known to significant others in their lives, using other group members and facilitator for support and feedback.  Therapeutic Goals: 1. Patient will identify two or more emotions or situations they have that consume much of in their lives. 2. Patient will identify signs/triggers that life has become out of balance:  3. Patient will identify two ways to set boundaries in order to achieve balance in their lives:  4. Patient will demonstrate ability to communicate their needs through discussion and/or role plays  Summary of Patient Progress:          Therapeutic Modalities:   Cognitive Behavioral Therapy Solution-Focused Therapy Assertiveness Training  Greg Mieke Brinley, LCSW 

## 2017-02-04 NOTE — Progress Notes (Signed)
Patient on 1:1 no distress noted.   

## 2017-02-04 NOTE — Plan of Care (Signed)
Problem: Coping: Goal: Ability to verbalize frustrations and anger appropriately will improve Outcome: Progressing Working on coping skills    

## 2017-02-04 NOTE — Progress Notes (Signed)
Patient remained in room  In bed  Going to bathroom  Without difficulity 1:1  Present

## 2017-02-04 NOTE — Progress Notes (Signed)
D: Pt denies SI/HI/AVH. Pt is pleasant and cooperative, affect is flat but brightens upon approach. Patient continues to be on 1:1, no distress noted. Pt stated he feels better from doing ECT, he appears less anxious and he is interacting with peers and staff appropriately.  A: Pt was offered support and encouragement. Pt was given scheduled medications. Pt was encouraged to attend groups. Q 15 minute checks were done for safety.  R:Pt did not attend group. Pt is taking medication. Pt has no complaints.Pt receptive to treatment and safety maintained on unit.

## 2017-02-04 NOTE — Progress Notes (Signed)
Patient on 1:1 no distress noted, sitter at bedside.  

## 2017-02-04 NOTE — Tx Team (Signed)
Interdisciplinary Treatment and Diagnostic Plan Update  02/04/2017 Time of Session: 1500 Kyle [name] MRN: [id number]  Principal Diagnosis: Bipolar 1 disorder, depressed ([place])  Secondary Diagnoses: Principal Problem:   Bipolar 1 disorder, depressed ([place]) Active Problems:   Hypertension   Current Medications:  Current Facility-Administered Medications  Medication Dose Route Frequency Provider Last Rate Last Dose  . acetaminophen (TYLENOL) tablet 650 mg  650 mg Oral Q6H PRN Gonzella Lex, MD      . alum & mag hydroxide-simeth (MAALOX/MYLANTA) 200-200-20 MG/5ML suspension 30 mL  30 mL Oral Q4H PRN Gonzella Lex, MD      . aspirin EC tablet 81 mg  81 mg Oral Daily Gonzella Lex, MD   81 mg at 02/04/17 0801  . finasteride (PROSCAR) tablet 5 mg  5 mg Oral Daily Gonzella Lex, MD   5 mg at 02/04/17 0801  . magnesium hydroxide (MILK OF MAGNESIA) suspension 30 mL  30 mL Oral Daily PRN Gonzella Lex, MD      . metoprolol tartrate (LOPRESSOR) tablet 25 mg  25 mg Oral BID Gonzella Lex, MD   25 mg at 02/04/17 0801  . OLANZapine (ZYPREXA) tablet 10 mg  10 mg Oral QHS Gonzella Lex, MD   10 mg at 02/03/17 2149  . simvastatin (ZOCOR) tablet 10 mg  10 mg Oral q1800 Gonzella Lex, MD       PTA Medications: Prescriptions Prior to Admission  Medication Sig Dispense Refill Last Dose  . aspirin 81 MG chewable tablet Chew by mouth daily.     . feeding supplement, ENSURE ENLIVE, (ENSURE ENLIVE) LIQD Take 237 mLs by mouth 3 (three) times daily with meals. 237 mL 12   . finasteride (PROSCAR) 5 MG tablet Take 1 tablet (5 mg total) by mouth daily. 30 tablet 0   . metoprolol tartrate (LOPRESSOR) 25 MG tablet Take 25 mg by mouth 2 (two) times daily.   02/03/2017 at [place]  . OLANZapine zydis (ZYPREXA) 10 MG disintegrating tablet Take 1 tablet (10 mg total) by mouth at bedtime. 30 tablet 0   . simvastatin (ZOCOR) 10 MG tablet Take 1 tablet (10 mg total) by mouth daily at 6 PM. 30 tablet 0   . tamsulosin  (FLOMAX) 0.4 MG CAPS capsule Take 1 capsule (0.4 mg total) by mouth daily. 30 capsule 0     Patient Stressors: Health problems Marital or family conflict Medication change or noncompliance  Patient Strengths: Ability for insight Capable of independent living Communication skills Supportive family/friends  Treatment Modalities: Medication Management, Group therapy, Case management,  1 to 1 session with clinician, Psychoeducation, Recreational therapy.   Physician Treatment Plan for Primary Diagnosis: Bipolar 1 disorder, depressed ([place]) Long Term Goal(s): Improvement in symptoms so as ready for discharge Improvement in symptoms so as ready for discharge   Short Term Goals: Ability to verbalize feelings will improve Ability to disclose and discuss suicidal ideas Ability to identify and develop effective coping behaviors will improve Ability to maintain clinical measurements within normal limits will improve  Medication Management: Evaluate patient's response, side effects, and tolerance of medication regimen.  Therapeutic Interventions: 1 to 1 sessions, Unit Group sessions and Medication administration.  Evaluation of Outcomes: Not Met  Physician Treatment Plan for Secondary Diagnosis: Principal Problem:   Bipolar 1 disorder, depressed ([place]) Active Problems:   Hypertension  Long Term Goal(s): Improvement in symptoms so as ready for discharge Improvement in symptoms so as ready for discharge  Short Term Goals: Ability to verbalize feelings will improve Ability to disclose and discuss suicidal ideas Ability to identify and develop effective coping behaviors will improve Ability to maintain clinical measurements within normal limits will improve     Medication Management: Evaluate patient's response, side effects, and tolerance of medication regimen.  Therapeutic Interventions: 1 to 1 sessions, Unit Group sessions and Medication administration.  Evaluation of Outcomes: Not  Met   RN Treatment Plan for Primary Diagnosis: Bipolar 1 disorder, depressed ([name]) Long Term Goal(s): Knowledge of disease and therapeutic regimen to maintain health will improve  Short Term Goals: Ability to remain free from injury will improve, Ability to verbalize feelings will improve, Compliance with prescribed medications will improve and perform ADLs, improved appetite, decreased depression.  Medication Management: RN will administer medications as ordered by provider, will assess and evaluate patient's response and provide education to patient for prescribed medication. RN will report any adverse and/or side effects to prescribing provider.  Therapeutic Interventions: 1 on 1 counseling sessions, Psychoeducation, Medication administration, Evaluate responses to treatment, Monitor vital signs and CBGs as ordered, Perform/monitor CIWA, COWS, AIMS and Fall Risk screenings as ordered, Perform wound care treatments as ordered.  Evaluation of Outcomes: Not Met   LCSW Treatment Plan for Primary Diagnosis: Bipolar 1 disorder, depressed ([place]) Long Term Goal(s): Safe transition to appropriate next level of care at discharge, Engage patient in therapeutic group addressing interpersonal concerns.  Short Term Goals: Engage patient in aftercare planning with referrals and resources and Increase skills for wellness and recovery  Therapeutic Interventions: Assess for all discharge needs, 1 to 1 time with Social worker, Explore available resources and support systems, Assess for adequacy in community support network, Educate family and significant other(s) on suicide prevention, Complete Psychosocial Assessment, Interpersonal group therapy.  Evaluation of Outcomes: Not Met   Progress in Treatment: Attending groups: No. Participating in groups: No. Taking medication as prescribed: Yes. Toleration medication: Yes. Family/Significant other contact made: Yes, individual(s) contacted:  Sander Radon, sister Patient understands diagnosis: Yes. Discussing patient identified problems/goals with staff: Yes. Medical problems stabilized or resolved: Yes. Denies suicidal/homicidal ideation: Yes. Issues/concerns per patient self-inventory: No. Other: none  New problem(s) identified: No, Describe:  none  New Short Term/Long Term Goal(s):  Discharge Plan or Barriers: CSW assessing for appropriate plan.  Reason for Continuation of Hospitalization: Depression  Estimated Length of Stay: 7 days.  Attendees: Patient: 02/04/2017 3:19 PM  Physician: Dr Weber Cooks, MD 02/04/2017 3:19 PM  Nursing: Elige Radon, RN 02/04/2017 3:19 PM  RN Care Manager: 02/04/2017 3:19 PM  Social Worker: Lurline Idol, LCSW 02/04/2017 3:19 PM  Recreational Therapist: Everitt Amber, LRT/CTRS  02/04/2017 3:19 PM  Other:  02/04/2017 3:19 PM  Other:  02/04/2017 3:19 PM  Other: 02/04/2017 3:19 PM    Scribe for Treatment Team: Joanne Chars, [place] 02/04/2017 3:19 PM

## 2017-02-04 NOTE — [facility] Group Notes (Signed)
[facility] Group Notes:  (Nursing/MHT/Case Management/Adjunct)  Date:  02/04/2017  Time:  9:07 PM  Type of Therapy:  Evening Wrap-up Group  Participation Level:  Did Not Attend  Participation Quality:  N/A  Affect:  N/A  Cognitive:  N/A  Insight:  None  Engagement in Group:  Did Not Attend  Modes of Intervention:  Discussion  Summary of Progress/Problems:  Tomasita MorrowChelsea Nanta Jolyn Deshmukh 02/04/2017, 9:07 PM

## 2017-02-04 NOTE — Progress Notes (Signed)
Patient sitting on side of bed . Stated to Clinical research associatewriter  He doesn't feel right  Remained in room 1:1 present
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
## 2017-02-04 NOTE — Progress Notes (Signed)
Patient  Remains to lie down in bed , voice no other concerns 1:1 present .

## 2017-02-04 NOTE — Progress Notes (Signed)
Patient alert and oriented 2-4 with periods of confusion to time and situation. Patient is on 1:1 for safety, no distress noted, denies SI/HI/AVH, often assisted to bathroom, no distress noted will continue to [employer].

## 2017-02-04 NOTE — Progress Notes (Signed)
Patient continues to be on 1:1 no distress noted.  

## 2017-02-04 NOTE — Progress Notes (Signed)
Patient on 1:1 no distress noted, resting in bed with eyes closed.

## 2017-02-04 NOTE — Progress Notes (Signed)
Recreation Therapy Notes  At approximately 2:10 pm, LRT attempted assessment. Patient refused.  Jacquelynn CreeGreene,Sou Nohr M, LRT/CTRS 02/04/2017 2:20 PM
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
## 2017-02-05 ENCOUNTER — Inpatient Hospital Stay: Payer: Medicare Other | Admitting: Anesthesiology

## 2017-02-05 ENCOUNTER — Inpatient Hospital Stay (HOSPITAL_COMMUNITY)
Admission: EM | Admit: 2017-02-05 | Discharge: 2017-02-05 | Disposition: A | Discharge status: Home / Self Care | Payer: Medicare Other | Source: Intra-hospital | Attending: Psychiatry | Admitting: Psychiatry

## 2017-02-05 ENCOUNTER — Other Ambulatory Visit: Payer: Self-pay | Admitting: Psychiatry

## 2017-02-05 DIAGNOSIS — F332 Major depressive disorder, recurrent severe without psychotic features: Secondary | ICD-10-CM

## 2017-02-05 LAB — GLUCOSE, CAPILLARY
GLUCOSE-CAPILLARY: 132 mg/dL — AB (ref 65–99)
Glucose-Capillary: 102 mg/dL — ABNORMAL HIGH (ref 65–99)

## 2017-02-05 MED ORDER — SUCCINYLCHOLINE CHLORIDE 200 MG/10ML IV SOSY
PREFILLED_SYRINGE | INTRAVENOUS | Status: DC | PRN
  Administered 2017-02-05: 120 mg via INTRAVENOUS

## 2017-02-05 MED ORDER — SODIUM CHLORIDE 0.9 % IV SOLN
500.0000 mL | Freq: Once | INTRAVENOUS | Status: AC
  Administered 2017-02-05: 1000 mL via INTRAVENOUS

## 2017-02-05 MED ORDER — SODIUM CHLORIDE 0.9 % IV SOLN
INTRAVENOUS | Status: DC | PRN
  Administered 2017-02-05: 10:00:00 via INTRAVENOUS

## 2017-02-05 MED ORDER — METHOHEXITAL SODIUM 100 MG/10ML IV SOSY
PREFILLED_SYRINGE | INTRAVENOUS | Status: DC | PRN
  Administered 2017-02-05: 80 mg via INTRAVENOUS

## 2017-02-05 MED ORDER — SUCCINYLCHOLINE CHLORIDE 20 MG/ML IJ SOLN
INTRAMUSCULAR | Status: AC
  Filled 2017-02-05: qty 1
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
## 2017-02-05 NOTE — Progress Notes (Signed)
2200: Patient currently in room awake. Alert and oriented. Safety maintained on 1:1 precautions.

## 2017-02-05 NOTE — Progress Notes (Signed)
PT Cancellation Note  Patient Details Name: Kyle [name] MRN: [id number] DOB: [birth year]   Cancelled Treatment:    Reason Eval/Treat Not Completed: Patient at procedure or test/unavailable; Pt at procedure and not available for PT eval during AM, will attempt PT eval at a future time/date as appropriate.    Ovidio Hanger. Scott Darvin Dials PT, DPT 02/05/17, 10:50 AM

## 2017-02-05 NOTE — Progress Notes (Signed)
Patient appropriate behavior with 1:1. 

## 2017-02-05 NOTE — Progress Notes (Signed)
Patient back from ECT.Pleasant & interacting more with staff & peers.Sitter with patient.

## 2017-02-05 NOTE — Procedures (Signed)
ECT SERVICES Physician's Interval Evaluation & Treatment Note  Patient Identification: Kyle [name] MRN:  [id number] Date of Evaluation:  02/05/2017 TX #: 3  MADRS:   MMSE:   P.E. Findings:  No change to physical exam lungs and heart clear vitals unremarkable  Psychiatric Interval Note:  Flat withdrawn and not actively suicidal occasional hallucinations  Subjective:  Patient is a 71 y.o. male seen for evaluation for Electroconvulsive Therapy. Depressed  Treatment Summary:   [x]   Right Unilateral             []  Bilateral   % Energy : 0.3 ms 100%   Impedance: 1350 ohms  Seizure Energy Index: 3787 V squared  Postictal Suppression Index: 87%  Seizure Concordance Index: 91%  Medications  Pre Shock: Brevital 80 mg succinylcholine 100 mg  Post Shock:    Seizure Duration: 22 seconds by EMG, 59 seconds by EEG   Comments: Continue current medicine regimen follow-up next treatment Monday into next week at least.   Lungs:  [x]   Clear to auscultation               []  Other:   Heart:    [x]   Regular rhythm             []  irregular rhythm    [x]   Previous H&P reviewed, patient examined and there are NO CHANGES                 []   Previous H&P reviewed, patient examined and there are changes noted.   Kyle RasmussenJohn Lis Savitt, MD 3/16/201810:31 AM

## 2017-02-05 NOTE — Progress Notes (Signed)
1900: Patient in room, sitter at bed side. Denies SI./HI. Denies hallucinations. Resting in bed. Safety precautions maintained.
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
## 2017-02-05 NOTE — Plan of Care (Signed)
Problem: Spiritual Needs Goal: Ability to function at adequate level Outcome: Progressing Discussed his feelings with this writer: denies SI/HI.

## 2017-02-05 NOTE — Anesthesia Post-op Follow-up Note (Cosign Needed)
Anesthesia QCDR form completed.        

## 2017-02-05 NOTE — Progress Notes (Signed)
CSW spoke to pt regarding recommendation from Dr Gerre Pebbleslapac and from his sister that he not return home at discharge and go to an assisted living facility instead.  Pt stated that this was OK with him and he was interested in going to assisted living.   Kyle [name], MSW, LCSW Clinical Social Worker 02/05/2017 2:20 PM

## 2017-02-05 NOTE — Progress Notes (Signed)
Patient attending group.Sitter with patient.
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
## 2017-02-05 NOTE — Plan of Care (Signed)
Problem: Education: Goal: Knowledge of [facility] Education information/materials will improve Outcome: Progressing Patient understand safety information and remains compliant

## 2017-02-05 NOTE — [facility] Group Notes (Signed)
[facility] LCSW Group Therapy Note  Date/Time: 02/05/17, 1300  Type of Therapy and Topic:  Group Therapy:  Feelings around Relapse and Recovery  Participation Level:  Did Not Attend   Mood:  Description of Group:    Patients in this group will discuss emotions they experience before and after a relapse. They will process how experiencing these feelings, or avoidance of experiencing them, relates to having a relapse. Facilitator will guide patients to explore emotions they have related to recovery. Patients will be encouraged to process which emotions are more powerful. They will be guided to discuss the emotional reaction significant others in their lives may have to patients' relapse or recovery. Patients will be assisted in exploring ways to respond to the emotions of others without this contributing to a relapse.  Therapeutic Goals: 1. Patient will identify two or more emotions that lead to relapse for them:  2. Patient will identify two emotions that result when they relapse:  3. Patient will identify two emotions related to recovery:  4. Patient will demonstrate ability to communicate their needs through discussion and/or role plays.   Summary of Patient Progress:     Therapeutic Modalities:   Cognitive Behavioral Therapy Solution-Focused Therapy Assertiveness Training Relapse Prevention Therapy  Greg Jamirah Zelaya, LCSW       

## 2017-02-05 NOTE — Plan of Care (Signed)
Problem: Coping: Goal: Ability to verbalize feelings will improve Outcome: Progressing Pt able to express her thoughts and feelings to staff

## 2017-02-05 NOTE — Anesthesia Postprocedure Evaluation (Signed)
Anesthesia Post Note  Patient: Kyle [name]  Procedure(s) Performed: * No procedures listed *  Patient location during evaluation: PACU Anesthesia Type: General Level of consciousness: awake and alert Pain management: pain level controlled Vital Signs Assessment: post-procedure vital signs reviewed and stable Respiratory status: spontaneous breathing, nonlabored ventilation, respiratory function stable and patient connected to nasal cannula oxygen Cardiovascular status: blood pressure returned to baseline and stable Postop Assessment: no signs of nausea or vomiting Anesthetic complications: no     Last Vitals:  Vitals:   02/05/17 1120 02/05/17 1130  BP: (!) 116/52   Pulse: 65 65  Resp: 19 15  Temp: 36.9 C     Last Pain:  Vitals:   02/05/17 1100  TempSrc:   PainSc: 0-No pain                 Cleda MccreedyJoseph K Caili Escalera

## 2017-02-05 NOTE — Progress Notes (Signed)
Recreation Therapy Notes  Date: 03.16.18 Time: 9:30 am Location: Craft Room  Group Topic: Coping Skills  Goal Area(s) Addresses:  Patient will participate in healthy coping skill activity. Patient will verbalize benefit of using art as a coping skill.  Behavioral Response: Did not attend  Intervention: Coloring  Activity: Patients were given coloring sheets to color and were instructed to think about the emotions they were feeling and what their minds were focused on.  Education: LRT educated patients on healthy coping skills.  Education Outcome: Patient did not attend group.   Clinical Observations/Feedback: Patient at ECT treatment during group.  Jacquelynn CreeGreene,Kabeer Hoagland M, LRT/CTRS 02/05/2017 10:18 AM

## 2017-02-05 NOTE — Progress Notes (Signed)
2000: Patient remains in room with sitter. Calm and cooperative. No sign of discomfort. Safety maintained.

## 2017-02-05 NOTE — Progress Notes (Signed)
2100: Patient presented to the medication room. Reported to the nurse that he is feeling better. Had medications and was encouraged to join others in the day room and have a snack. Had no major concern. Remains on 1:1 precautions.

## 2017-02-05 NOTE — Transfer of Care (Signed)
Immediate Anesthesia Transfer of Care Note  Patient: Kyle [name]  Procedure(s) Performed: ECT  Patient Location: PACU  Anesthesia Type:General  Level of Consciousness: sedated  Airway & Oxygen Therapy: Patient Spontanous Breathing and Patient connected to face mask oxygen  Post-op Assessment: Report given to RN and Post -op Vital signs reviewed and stable  Post vital signs: Reviewed and stable  Last Vitals:  Vitals:   02/05/17 0843 02/05/17 1050  BP: 135/70 (!) 154/90  Pulse: (!) 56 92  Resp:  (!) 23  Temp: 36.4 C 36.9 C    Last Pain:  Vitals:   02/05/17 0843  TempSrc: Tympanic  PainSc: 0-No pain         Complications: No apparent anesthesia complications

## 2017-02-05 NOTE — Plan of Care (Signed)
Problem: Education: Goal: Mental status will improve Outcome: Progressing Alert and oriented, denying hallucinations/delusions

## 2017-02-05 NOTE — Progress Notes (Signed)
Recreation Therapy Notes  At approximately 2:05 pm, LRT attempted assessment. Patient sitter stated he was sleeping and had ECT and PT today. LRT will attempt assessment at a later time.  Jacquelynn CreeGreene,Carlena Ruybal M, LRT/CTRS 02/05/2017 3:14 PM

## 2017-02-05 NOTE — Progress Notes (Signed)
2300: Patient remains in room awake. No sign of distress. Safety maintained on 1:1.

## 2017-02-05 NOTE — Anesthesia Procedure Notes (Signed)
Date/Time: 02/05/2017 10:40 AM Performed by: Lily KocherPERALTA, Tocarra Gassen Pre-anesthesia Checklist: Patient identified, Emergency Drugs available, Suction available and Patient being monitored Patient Re-evaluated:Patient Re-evaluated prior to inductionOxygen Delivery Method: Circle system utilized Preoxygenation: Pre-oxygenation with 100% oxygen Intubation Type: IV induction Ventilation: Mask ventilation without difficulty and Mask ventilation throughout procedure Airway Equipment and Method: Bite block Placement Confirmation: positive ETCO2 Dental Injury: Teeth and Oropharynx as per pre-operative assessment

## 2017-02-05 NOTE — Anesthesia Preprocedure Evaluation (Signed)
Anesthesia Evaluation  Patient identified by MRN, date of birth, ID band Patient awake    Reviewed: Allergy & Precautions, NPO status , Patient's Chart, lab work & pertinent test results  Airway Mallampati: III       Dental  (+) Poor Dentition, Missing   Pulmonary neg pulmonary ROS,     + decreased breath sounds      Cardiovascular Exercise Tolerance: Good hypertension, Pt. on medications and Pt. on home beta blockers  Rhythm:Regular     Neuro/Psych PSYCHIATRIC DISORDERS Depression Bipolar Disorder  Neuromuscular disease    GI/Hepatic negative GI ROS, Neg liver ROS,   Endo/Other  diabetes, Type obesity  Renal/GU      Musculoskeletal   Abdominal (+) + obese,   Peds negative pediatric ROS (+)  Hematology negative hematology ROS (+)   Anesthesia Other Findings Past Medical History: No date: Bipolar 1 disorder (HCC) No date: BPH (benign prostatic hyperplasia) No date: Cerebrovascular disease No date: HTN No date: Hyperlipidemia No date: Neuropathy (HCC) No date: SVT (supraventricular tachycardia) (HCC) No date: Tremor, essential     Comment: only to the hands No date: Type 2 diabetes mellitus (HCC)    Reproductive/Obstetrics                             Anesthesia Physical  Anesthesia Plan  ASA: III  Anesthesia Plan: General   Post-op Pain Management:    Induction: Intravenous  Airway Management Planned: Mask and Natural Airway  Additional Equipment:   Intra-op Plan:   Post-operative Plan:   Informed Consent: I have reviewed the patients History and Physical, chart, labs and discussed the procedure including the risks, benefits and alternatives for the proposed anesthesia with the patient or authorized representative who has indicated his/her understanding and acceptance.     Plan Discussed with: CRNA  Anesthesia Plan Comments:         Anesthesia Quick  Evaluation

## 2017-02-05 NOTE — Progress Notes (Signed)
Patient denies suicidal or homicidal ideations & AV hallucinations.Pleasant on approach.Appropriate with sitter.

## 2017-02-06 MED ORDER — TEMAZEPAM 15 MG PO CAPS
15.0000 mg | ORAL_CAPSULE | Freq: Every day | ORAL | Status: AC
  Administered 2017-02-06 – 2017-02-07 (×2): 15 mg via ORAL
  Filled 2017-02-06 (×2): qty 1

## 2017-02-06 NOTE — Progress Notes (Signed)
Patient remains in room  In bed  Staff at bedside 1:1

## 2017-02-06 NOTE — Progress Notes (Signed)
Patient ambulated in the hall with staff.  He reports pain in ankle is 10/10 and was given acetaminophen at 2124.

## 2017-02-06 NOTE — Progress Notes (Signed)
In dayroom  Watching  TV 1:1 present

## 2017-02-06 NOTE — Progress Notes (Signed)
Dayroom  Watching  TV 1:1 present

## 2017-02-06 NOTE — Progress Notes (Signed)
Patient in bed  Resting  ;voice no concerns 1;1 present.

## 2017-02-06 NOTE — Progress Notes (Signed)
Patient is observed reclining on his bed and continues on continuous observation for safety.

## 2017-02-06 NOTE — Progress Notes (Addendum)
In dayroom  With peers  Watching  TV 1:1 present

## 2017-02-06 NOTE — Progress Notes (Signed)
Patient in room awake, calm and cooperative. Staff continue to monitor on 1:1.

## 2017-02-06 NOTE — Progress Notes (Signed)
Patient remains sleep, sitter at bedside. No sign of discomfort.

## 2017-02-06 NOTE — Progress Notes (Signed)
0200: Patient remains asleep in bed, sitter monitoring

## 2017-02-06 NOTE — Progress Notes (Signed)
Ambulated to day room for breakfast , appetite good  1:1 present . Gait  Normal this am shift

## 2017-02-06 NOTE — Progress Notes (Signed)
Group in the  Court yard 1:1 present
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
## 2017-02-06 NOTE — Progress Notes (Signed)
Resting  quietly in bed this am . 1:1 present

## 2017-02-06 NOTE — Progress Notes (Signed)
Patient is observed sleeping. 

## 2017-02-06 NOTE — Progress Notes (Signed)
Patient is friendly and cooperative on contact.  He denies SI. HI, or Auditory Hallucinations.

## 2017-02-06 NOTE — Progress Notes (Signed)
Patient ambulated in hall with staff and was observed sitting in the dayroom.

## 2017-02-06 NOTE — Progress Notes (Signed)
Room  Shower 1:1 present .

## 2017-02-06 NOTE — Progress Notes (Signed)
In dayroom  watching  Basketball 1:1 present

## 2017-02-06 NOTE — Progress Notes (Signed)
Returned to room in bed  Resting 1:1 present

## 2017-02-06 NOTE — Progress Notes (Addendum)
D: Patient stated slept fair last night .Stated appetite is good and energy level  Low . Stated concentration is good . Stated on Depression scale 5 , hopeless 5 and anxiety 5.( low 0-10 high) Denies suicidal  homicidal ideations  .  No auditory hallucinations  No pain concerns . Appropriate ADL'S. Interacting with peers and staff. Patient voice of irritability and tremors . Voice of wanting a sleeping medication for tonight. A: Encourage patient participation with unit programming . Instruction  Given on  Medication , verbalize understanding. R: Voice no other concerns. Staff continue to monitor

## 2017-02-06 NOTE — [facility] Group Notes (Signed)
[facility] LCSW Group Therapy  02/06/2017 2:08 PM  Type of Therapy:  Group Therapy  Participation Level:  Minimal  Participation Quality:  Attentive  Affect:  Appropriate  Cognitive:  Alert  Insight:  Limited  Engagement in Therapy:  Limited  Modes of Intervention:  Discussion, Education, Reality Testing, Socialization and Support  Summary of Progress/Problems: Goal Setting: The objective is to set goals as they relate to the crisis in which they were admitted. Patients will be using SMART goal modalities to set measurable goals. Characteristics of realistic goals will be discussed and patients will be assisted in setting and processing how one will reach their goal. Facilitator will also assist patients in applying interventions and coping skills learned in psycho-education groups to the SMART goal and process how one will achieve defined goal. Patient discussed wanting to improve his sleep.   Kyle [name] G. Garnette CzechSampson MSW, LCSWA 02/06/2017, 2:11 PM

## 2017-02-06 NOTE — Progress Notes (Signed)
1:1Dayroom   With peers eating 1:1 present

## 2017-02-06 NOTE — Progress Notes (Signed)
0100: Patient currently in bed sleeping, sitter present. Breathing well. No sign of discomfort. Safety maintained on 1:1.

## 2017-02-06 NOTE — Plan of Care (Signed)
Problem: Coping: Goal: Ability to cope will improve Outcome: Progressing Working on coping skillls

## 2017-02-07 NOTE — Progress Notes (Signed)
Patient is observed sleeping. 

## 2017-02-07 NOTE — Progress Notes (Signed)
[facility] Post 1:1 Observation Documentation  For the first (8) hours following discontinuation of 1:1 precautions, a progress note entry by nursing staff should be documented at least every 2 hours, reflecting the patient's behavior, condition, mood, and conversation.  Use the progress notes for additional entries.  Time 1:1 discontinued:  1330   Patient's Behavior:  Calm; cooperative.  Patient's Condition:  Patient is ambulating well.  He is steady on his feet.  He is walking with his assistive device and does not need assistance.  Patient's Conversation:  Patient is agreeable to come off 1:1 observation.   Cranford MonBeaudry, Analie Katzman Evans 02/07/2017, 1:47 PM

## 2017-02-07 NOTE — [facility] Group Notes (Signed)
[facility] LCSW Group Therapy  02/07/2017 2:41 PM  Type of Therapy:  Group Therapy  Participation Level:  Patient did not attend group. CSW invited patient to group.   Summary of Progress/Problems: Stress management: Patients defined and discussed the topic of stress and the related symptoms and triggers for stress. Patients identified healthy coping skills they would like to try during hospitalization and after discharge to manage stress in a healthy way. CSW offered insight to varying stress management techniques.   Ennis Heavner G. Garnette CzechSampson MSW, LCSWA 02/07/2017, 2:41 PM

## 2017-02-07 NOTE — Plan of Care (Signed)
Problem: Coping: Goal: Ability to cope will improve Outcome: Progressing Patient was friendly on contact and was observed watching peers played cards.  He engaged with Clinical research associatewriter in a brief conversation about medications and was clear.

## 2017-02-07 NOTE — Progress Notes (Signed)
1:1 observation note (due Q4H per [facility] Policy): Patient has been lying in bed.  He was alert enough after breakfast to assess.  He denies any severe pain.  His medications were brought to him.  His speech is minimal, answering "yes" or "no" to questions.  He denies any thought of self harm; he does not appear to be responding to internal stimuli.  He is cooperative and his behavior is appropriate.  Patient has been receiving ECT treatments and is ambulating with a walker due to unsteadiness on his feet.  He will remain on 1:1 observation due high fall risk. Patient is a possible placement to assisted living per treatment team notes.  His sister is his support system. Continue to monitor medication management and MD orders.  Safety checks continued every 15 minutes per protocol.  Offer support and encouragement as needed.
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
## 2017-02-07 NOTE — Progress Notes (Signed)
Patient continues to be observed on 1:1 for safety.  He continues to be observed sleeping.

## 2017-02-07 NOTE — Progress Notes (Signed)
D: Patient spent most of this evening in the dayroom with peers, watching television or playing cards.  He was friendly on contact and talked about living in [place]. He was given information about his night time medicines and was reminded that he has ECT tomorrow.   A:  Patient demonstrates mild confusion when asking questions about his medications and finding his room.  His gait was steady this evening. He rates back pain at 10/10 and would like to talk to his doctor about taking naproxyn for pain. R:  Patient is NPO for ECT in the am and was given information about this.

## 2017-02-07 NOTE — [facility] Group Notes (Signed)
[facility] Group Notes:  (Nursing/MHT/Case Management/Adjunct)  Date:  02/07/2017  Time:  12:14 AM  Type of Therapy:  Group Therapy  Participation Level:  Did Not Attend   Veva Holesshley Imani Tyshana Nishida 02/07/2017, 12:14 AM

## 2017-02-08 ENCOUNTER — Inpatient Hospital Stay: Payer: Medicare Other | Admitting: Anesthesiology

## 2017-02-08 ENCOUNTER — Other Ambulatory Visit: Payer: Self-pay | Admitting: Psychiatry

## 2017-02-08 ENCOUNTER — Encounter: Payer: Self-pay | Admitting: *Deleted

## 2017-02-08 LAB — GLUCOSE, CAPILLARY
GLUCOSE-CAPILLARY: 111 mg/dL — AB (ref 65–99)
Glucose-Capillary: 86 mg/dL (ref 65–99)

## 2017-02-08 MED ORDER — ONDANSETRON HCL 4 MG/2ML IJ SOLN: 4.0000 mg | Freq: Once | INTRAMUSCULAR | Status: DC | PRN

## 2017-02-08 MED ORDER — METHOHEXITAL SODIUM 100 MG/10ML IV SOSY
PREFILLED_SYRINGE | INTRAVENOUS | Status: DC | PRN
  Administered 2017-02-08: 80 mg via INTRAVENOUS

## 2017-02-08 MED ORDER — FENTANYL CITRATE (PF) 100 MCG/2ML IJ SOLN: 25.0000 ug | INTRAMUSCULAR | Status: DC | PRN

## 2017-02-08 MED ORDER — SODIUM CHLORIDE 0.9 % IV SOLN
500.0000 mL | Freq: Once | INTRAVENOUS | Status: AC
  Administered 2017-02-08: 1000 mL via INTRAVENOUS

## 2017-02-08 MED ORDER — SODIUM CHLORIDE 0.9 % IV SOLN
INTRAVENOUS | Status: DC | PRN
Start: 2017-02-08 — End: 2017-02-08
  Administered 2017-02-08: 11:00:00 via INTRAVENOUS

## 2017-02-08 MED ORDER — SUCCINYLCHOLINE CHLORIDE 20 MG/ML IJ SOLN
INTRAMUSCULAR | Status: DC | PRN
  Administered 2017-02-08: 120 mg via INTRAVENOUS

## 2017-02-08 NOTE — Anesthesia Postprocedure Evaluation (Signed)
Anesthesia Post Note  Patient: Kyle [name]  Procedure(s) Performed: * No procedures listed *  Patient location during evaluation: PACU Anesthesia Type: General Level of consciousness: awake and alert Pain management: pain level controlled Vital Signs Assessment: post-procedure vital signs reviewed and stable Respiratory status: spontaneous breathing, nonlabored ventilation, respiratory function stable and patient connected to nasal cannula oxygen Cardiovascular status: blood pressure returned to baseline and stable Postop Assessment: no signs of nausea or vomiting Anesthetic complications: no     Last Vitals:  Vitals:   02/08/17 1136 02/08/17 1142  BP:  (!) 127/48  Pulse: (!) 57 62  Resp: 18 19  Temp:  36.7 C    Last Pain:  Vitals:   02/08/17 1112  TempSrc:   PainSc: Asleep                 [name] S
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
## 2017-02-08 NOTE — Procedures (Signed)
ECT SERVICES Physician's Interval Evaluation & Treatment Note  Patient Identification: Kyle [name] MRN:  [id number] Date of Evaluation:  02/08/2017 TX #: 4  MADRS: 21  MMSE: 28  P.E. Findings:  No change to physical exam vital stable heart and lungs normal.  Psychiatric Interval Note:  Affect still blunted energy level low. No active suicidal thoughts. Occasional hallucinations.  Subjective:  Patient is a 71 y.o. male seen for evaluation for Electroconvulsive Therapy. Energy still low.  Treatment Summary:   [x]   Right Unilateral             []  Bilateral   % Energy : 0.3 ms 100%   Impedance: 1400 ohms  Seizure Energy Index: 3377 V squared  Postictal Suppression Index: 71%  Seizure Concordance Index: No reading. One electrode was not picking up  Medications  Pre Shock: Brevital 80 mg succinylcholine 100 mg  Post Shock:    Seizure Duration: 23 seconds EMG 33 seconds EEG   Comments: Follow-up Wednesday   Lungs:  [x]   Clear to auscultation               []  Other:   Heart:    [x]   Regular rhythm             []  irregular rhythm    [x]   Previous H&P reviewed, patient examined and there are NO CHANGES                 []   Previous H&P reviewed, patient examined and there are changes noted.   Kyle RasmussenJohn Clapacs, MD 3/19/201810:55 AM

## 2017-02-08 NOTE — Progress Notes (Signed)
Pt back on BMU from ECT, appears to be in acute distress. Sitting in dayroom eating lunch without difficulty, requesting a coke. Walker available next to him. Safety maintained. Willl continue to monitor.
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
## 2017-02-08 NOTE — Progress Notes (Signed)
Recreation Therapy Notes  Date: 03.19.18 Time: 9:30 am Location: Craft Room  Group Topic: Self-expression  Goal Area(s) Addresses:  Patient will identify one color per emotion listed on wheel. Patient will verbalize benefit of using art as a means of self-expression. Patient will verbalize one emotion experienced during session. Patient will be educated on other forms of self-expression.  Behavioral Response: Did not attend  Intervention: Emotion Wheel  Activity: Patients were given an Emotion Wheel worksheet and were instructed to pick a color for each emotion listed on the wheel.  Education: LRT educated patient on different forms of self-expression.  Education Outcome: Patient did not attend group.  Clinical Observations/Feedback: Patient did not attend group.  Jacquelynn CreeGreene,Georgina Krist M, LRT/CTRS 02/08/2017 10:09 AM

## 2017-02-08 NOTE — Progress Notes (Signed)
Pt awake, alert, oriented post ECT. Denies pain. Appropriately interacts with staff, minimal interaction with peers. Stays in room sleeping this afternoon although encouraged to go outside for group. Denies SI/HI/AVH. Medication compliant. Brighter, less depressed on interaction. Support and encouragement provided. Medications administered as ordered with education. Gait steady and is noticed walking without walker assistance. Informed to use walker for safety when ambulating. Safety maintained with every 15 minute checks. Will continue to monitor.

## 2017-02-08 NOTE — [facility] Group Notes (Signed)
[facility] LCSW Group Therapy   02/08/2017 1:00 pm Type of Therapy: Group Therapy   Participation Level: Pt invited but did not attend.  Participation Quality: Pt invited but did not attend.  Summary of Progress/Problems: Pt identified obstacles faced currently and processed barriers involved in overcoming these obstacles. Pt identified steps necessary for overcoming these obstacles and explored motivation (internal and external) for facing these difficulties head on. Pt further identified one area of concern in their lives and chose a goal to focus on for today.   Kyle AbbotKadijah [name], MSW, LCSWA 02/08/2017, 1:42PM

## 2017-02-08 NOTE — Plan of Care (Signed)
Problem: Activity: Goal: Imbalance in normal sleep/wake cycle will improve Outcome: Not Progressing Pt continues to complain of poor sleep, not being able to fall asleep, states, "it's hard to sleep in here." Pt voiced being able to sleep just fine when home.

## 2017-02-08 NOTE — [facility] Group Notes (Signed)
[facility] Group Notes:  (Nursing/MHT/Case Management/Adjunct)  Date:  02/08/2017  Time:  4:37 PM  Type of Therapy:  Psychoeducational Skills  Participation Level:  Did Not Attend     [name] 02/08/2017, 4:37 PM

## 2017-02-08 NOTE — Anesthesia Post-op Follow-up Note (Cosign Needed)
Anesthesia QCDR form completed.        

## 2017-02-08 NOTE — Tx Team (Signed)
Interdisciplinary Treatment and Diagnostic Plan Update  02/08/2017 Time of Session: 1500 SHAMOND SKELTON MRN: [id number]  Principal Diagnosis: Bipolar 1 disorder, depressed (HCC)  Secondary Diagnoses: Principal Problem:   Bipolar 1 disorder, depressed (HCC) Active Problems:   Hypertension   Current Medications:  Current Facility-Administered Medications  Medication Dose Route Frequency Provider Last Rate Last Dose  . acetaminophen (TYLENOL) tablet 650 mg  650 mg Oral Q6H PRN Audery Amel, MD   650 mg at 02/07/17 2153  . alum & mag hydroxide-simeth (MAALOX/MYLANTA) 200-200-20 MG/5ML suspension 30 mL  30 mL Oral Q4H PRN Audery Amel, MD      . aspirin EC tablet 81 mg  81 mg Oral Daily Audery Amel, MD   81 mg at 02/08/17 1309  . fentaNYL (SUBLIMAZE) injection 25 mcg  25 mcg Intravenous Q5 min PRN Berdine Addison, MD      . finasteride (PROSCAR) tablet 5 mg  5 mg Oral Daily Audery Amel, MD   5 mg at 02/08/17 1308  . FLUoxetine (PROZAC) capsule 20 mg  20 mg Oral Daily Audery Amel, MD   20 mg at 02/08/17 1309  . magnesium hydroxide (MILK OF MAGNESIA) suspension 30 mL  30 mL Oral Daily PRN Audery Amel, MD      . metoprolol tartrate (LOPRESSOR) tablet 25 mg  25 mg Oral BID Audery Amel, MD   25 mg at 02/08/17 0756  . OLANZapine (ZYPREXA) tablet 15 mg  15 mg Oral QHS Audery Amel, MD   15 mg at 02/07/17 2145  . ondansetron (ZOFRAN) injection 4 mg  4 mg Intravenous Once PRN Berdine Addison, MD      . simvastatin (ZOCOR) tablet 10 mg  10 mg Oral q1800 Audery Amel, MD   10 mg at 02/07/17 1721   PTA Medications: Prescriptions Prior to Admission  Medication Sig Dispense Refill Last Dose  . metoprolol tartrate (LOPRESSOR) 25 MG tablet Take 25 mg by mouth 2 (two) times daily.   02/08/2017 at 0756  . aspirin 81 MG chewable tablet Chew by mouth daily.     . feeding supplement, ENSURE ENLIVE, (ENSURE ENLIVE) LIQD Take 237 mLs by mouth 3 (three) times daily with meals. 237 mL 12   .  finasteride (PROSCAR) 5 MG tablet Take 1 tablet (5 mg total) by mouth daily. 30 tablet 0   . OLANZapine zydis (ZYPREXA) 10 MG disintegrating tablet Take 1 tablet (10 mg total) by mouth at bedtime. 30 tablet 0   . simvastatin (ZOCOR) 10 MG tablet Take 1 tablet (10 mg total) by mouth daily at 6 PM. 30 tablet 0   . tamsulosin (FLOMAX) 0.4 MG CAPS capsule Take 1 capsule (0.4 mg total) by mouth daily. 30 capsule 0     Patient Stressors: Health problems Marital or family conflict Medication change or noncompliance  Patient Strengths: Ability for insight Capable of independent living Communication skills Supportive family/friends  Treatment Modalities: Medication Management, Group therapy, Case management,  1 to 1 session with clinician, Psychoeducation, Recreational therapy.   Physician Treatment Plan for Primary Diagnosis: Bipolar 1 disorder, depressed (HCC) Long Term Goal(s): Improvement in symptoms so as ready for discharge Improvement in symptoms so as ready for discharge   Short Term Goals: Ability to verbalize feelings will improve Ability to disclose and discuss suicidal ideas Ability to identify and develop effective coping behaviors will improve Ability to maintain clinical measurements within normal limits will improve  Medication Management: Evaluate patient's  response, side effects, and tolerance of medication regimen.  Therapeutic Interventions: 1 to 1 sessions, Unit Group sessions and Medication administration.  Evaluation of Outcomes: Progressing  Physician Treatment Plan for Secondary Diagnosis: Principal Problem:   Bipolar 1 disorder, depressed (HCC) Active Problems:   Hypertension  Long Term Goal(s): Improvement in symptoms so as ready for discharge Improvement in symptoms so as ready for discharge   Short Term Goals: Ability to verbalize feelings will improve Ability to disclose and discuss suicidal ideas Ability to identify and develop effective coping  behaviors will improve Ability to maintain clinical measurements within normal limits will improve     Medication Management: Evaluate patient's response, side effects, and tolerance of medication regimen.  Therapeutic Interventions: 1 to 1 sessions, Unit Group sessions and Medication administration.  Evaluation of Outcomes: Progressing   RN Treatment Plan for Primary Diagnosis: Bipolar 1 disorder, depressed (HCC) Long Term Goal(s): Knowledge of disease and therapeutic regimen to maintain health will improve  Short Term Goals: Ability to remain free from injury will improve, Ability to verbalize feelings will improve, Compliance with prescribed medications will improve and perform ADLs, improved appetite, decreased depression.  Medication Management: RN will administer medications as ordered by provider, will assess and evaluate patient's response and provide education to patient for prescribed medication. RN will report any adverse and/or side effects to prescribing provider.  Therapeutic Interventions: 1 on 1 counseling sessions, Psychoeducation, Medication administration, Evaluate responses to treatment, Monitor vital signs and CBGs as ordered, Perform/monitor CIWA, COWS, AIMS and Fall Risk screenings as ordered, Perform wound care treatments as ordered.  Evaluation of Outcomes: Progressing   LCSW Treatment Plan for Primary Diagnosis: Bipolar 1 disorder, depressed (HCC) Long Term Goal(s): Safe transition to appropriate next level of care at discharge, Engage patient in therapeutic group addressing interpersonal concerns.  Short Term Goals: Engage patient in aftercare planning with referrals and resources and Increase skills for wellness and recovery  Therapeutic Interventions: Assess for all discharge needs, 1 to 1 time with Social worker, Explore available resources and support systems, Assess for adequacy in community support network, Educate family and significant other(s) on suicide  prevention, Complete Psychosocial Assessment, Interpersonal group therapy.  Evaluation of Outcomes: Progressing   Progress in Treatment: Attending groups: Yes: only one group. Participating in groups: Pt did participate in the one he attended. Taking medication as prescribed: Yes. Toleration medication: Yes. Family/Significant other contact made: Yes, individual(s) contacted:  Anthoney HaradaMargaret Ann Williams, sister Patient understands diagnosis: Yes. Discussing patient identified problems/goals with staff: Yes. Medical problems stabilized or resolved: Yes. Denies suicidal/homicidal ideation: Yes. Issues/concerns per patient self-inventory: No. Other: none  New problem(s) identified: No, Describe:  none  New Short Term/Long Term Goal(s):  Discharge Plan or Barriers: CSW assessing for appropriate plan.  Reason for Continuation of Hospitalization: Depression  Estimated Length of Stay: 7-10 days.  Attendees: Patient: 02/08/2017 2:58 PM  Physician: Dr Toni Amendlapacs, MD 02/08/2017 2:58 PM  Nursing: Shelia MediaJanet Jones, RN 02/08/2017 2:58 PM  RN Care Manager: 02/08/2017 2:58 PM  Social Worker: Daleen SquibbGreg Maveryk Renstrom, LCSW 02/08/2017 2:58 PM  Recreational Therapist:   02/08/2017 2:58 PM  Other:  02/08/2017 2:58 PM  Other:  02/08/2017 2:58 PM  Other: 02/08/2017 2:58 PM    Scribe for Treatment Team: Lorri FrederickWierda, Naje Rice Jon, LCSW 02/08/2017 2:58 PM
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
## 2017-02-08 NOTE — [facility] Group Notes (Signed)
[facility] Group Notes:  (Nursing/MHT/Case Management/Adjunct)  Date:  02/08/2017  Time:  9:05 PM  Type of Therapy:  Evening Wrap-up Group  Participation Level:  Active  Participation Quality:  Appropriate and Attentive  Affect:  Appropriate  Cognitive:  Alert and Appropriate  Insight:  Appropriate, Good and Improving  Engagement in Group:  Developing/Improving and Engaged  Modes of Intervention:  Discussion  Summary of Progress/Problems:  Tomasita MorrowChelsea Nanta Ascencion Coye 02/08/2017, 9:05 PM

## 2017-02-09 NOTE — NC FL2 (Signed)
[place] MEDICAID FL2 LEVEL OF CARE SCREENING TOOL     IDENTIFICATION  Patient Name: Kyle [name] Birthdate: [birth year] Sex: male Admission Date (Current Location): 02/03/2017  [facility] [place] Number:  Film/video editor  (na) Facility and Address:  [facility], [address], [place], [place] [address]      Provider Number: [id number]  Attending Physician Name and Address:  Audery Amel, MD  Relative Name and Phone Number:  Anthoney Harada, sister, [phone]    Current Level of Care: Hospital Recommended Level of Care: Assisted Living Facility Prior Approval Number:    Date Approved/Denied:   PASRR Number:    Discharge Plan: Other (Comment) (assisted living)    Current Diagnoses: Patient Active Problem List   Diagnosis Date Noted  . Bipolar 1 disorder, depressed (HCC) 02/03/2017  . Catatonia 01/26/2017  . Diabetes (HCC) 01/26/2017  . Suicide attempt   . SVT (supraventricular tachycardia) (HCC)   . Noncompliance 09/26/2015  . RLS (restless legs syndrome) 08/08/2015  . Peripheral neuropathy (HCC) 08/08/2015  . Cerebrovascular disease 08/07/2015  . Bipolar I disorder depressed with melancholic features (HCC) 08/01/2015  . Essential tremor 08/01/2015  . Hypertension 04/17/2015    Orientation RESPIRATION BLADDER Height & Weight     Self, Time, Situation, Place  Normal Continent Weight: 196 lb (88.9 kg) Height:  5\' 8"  (172.7 cm)  BEHAVIORAL SYMPTOMS/MOOD NEUROLOGICAL BOWEL NUTRITION STATUS   (na)  (na) Continent  (na)  AMBULATORY STATUS COMMUNICATION OF NEEDS Skin   Independent Verbally Normal                       Personal Care Assistance Level of Assistance   (na)           Functional Limitations Info   (na)          SPECIAL CARE FACTORS FREQUENCY   (na)                    Contractures Contractures Info: Not present    Additional Factors Info   (na)               Current Medications  (02/09/2017):  This is the current hospital active medication list Current Facility-Administered Medications  Medication Dose Route Frequency Provider Last Rate Last Dose  . acetaminophen (TYLENOL) tablet 650 mg  650 mg Oral Q6H PRN Audery Amel, MD   650 mg at 02/07/17 2153  . alum & mag hydroxide-simeth (MAALOX/MYLANTA) 200-200-20 MG/5ML suspension 30 mL  30 mL Oral Q4H PRN Audery Amel, MD      . aspirin EC tablet 81 mg  81 mg Oral Daily Audery Amel, MD   81 mg at 02/08/17 1309  . fentaNYL (SUBLIMAZE) injection 25 mcg  25 mcg Intravenous Q5 min PRN Berdine Addison, MD      . finasteride (PROSCAR) tablet 5 mg  5 mg Oral Daily Audery Amel, MD   5 mg at 02/08/17 1308  . FLUoxetine (PROZAC) capsule 20 mg  20 mg Oral Daily Audery Amel, MD   20 mg at 02/08/17 1309  . magnesium hydroxide (MILK OF MAGNESIA) suspension 30 mL  30 mL Oral Daily PRN Audery Amel, MD      . metoprolol tartrate (LOPRESSOR) tablet 25 mg  25 mg Oral BID Audery Amel, MD   25 mg at 02/08/17 1741  . OLANZapine (ZYPREXA) tablet 15 mg  15 mg Oral QHS John  T Clapacs, MD   15 mg at 02/08/17 2125  . ondansetron (ZOFRAN) injection 4 mg  4 mg Intravenous Once PRN Berdine AddisonMathai Thomas, MD      . simvastatin (ZOCOR) tablet 10 mg  10 mg Oral q1800 Audery AmelJohn T Clapacs, MD   10 mg at 02/08/17 1740     Discharge Medications: Please see discharge summary for a list of discharge medications.  Relevant Imaging Results:  Relevant Lab Results:   Additional Information  (na)  Lorri FrederickWierda, Gregory Jon, LCSW

## 2017-02-09 NOTE — Progress Notes (Signed)
Pt alert and oriented, up on unit today. Spends most of the morning and afternoon in his room lying in bed. Noted to be in dayroom after dinner watching TV with peers. Brightens on approach, but does still endorse feelings of depression. Denies SI/HI/AVH. Pt observed walking to/from dayroom without walker, steady gait. Pt informed to use walker, but he refuses. Did not attend groups. Pt is medication compliant today. Continues to complain of issues with falling to sleep at night, stating "I slept better two nights ago, but last night was worse." Support and encouragement provided. Medications administered as ordered with education. Safety maintained with every 15 minute checks. Will continue to monitor.
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
## 2017-02-09 NOTE — Plan of Care (Signed)
Problem: Medication: Goal: Compliance with prescribed medication regimen will improve Outcome: Progressing Pt compliant with medications. Does voice confusion on remembering names of medications and when to take them.

## 2017-02-09 NOTE — Plan of Care (Signed)
Problem: Activity: Goal: Sleeping patterns will improve Outcome: Not Met (add Reason) Patient slept for estimated hours of 5.30. Medication complaints. Questions encouraged and answered provided. No PRNs given. Safety maintained with 15 min checks. Patient remains free from harm.
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
## 2017-02-09 NOTE — [facility] Group Notes (Signed)
[facility] LCSW Group Therapy Note  Date/Time: 02/09/17, 1500  Type of Therapy/Topic:  Group Therapy:  Feelings about Diagnosis  Participation Level:  Did Not Attend   Mood:   Description of Group:    This group will allow patients to explore their thoughts and feelings about diagnoses they have received. Patients will be guided to explore their level of understanding and acceptance of these diagnoses. Facilitator will encourage patients to process their thoughts and feelings about the reactions of others to their diagnosis, and will guide patients in identifying ways to discuss their diagnosis with significant others in their lives. This group will be process-oriented, with patients participating in exploration of their own experiences as well as giving and receiving support and challenge from other group members.   Therapeutic Goals: 1. Patient will demonstrate understanding of diagnosis as evidence by identifying two or more symptoms of the disorder:  2. Patient will be able to express two feelings regarding the diagnosis 3. Patient will demonstrate ability to communicate their needs through discussion and/or role plays  Summary of Patient Progress:        Therapeutic Modalities:   Cognitive Behavioral Therapy Brief Therapy Feelings Identification   Daleen SquibbGreg Arin Vanosdol, LCSW

## 2017-02-09 NOTE — Progress Notes (Signed)
Recreation Therapy Notes  Date: 03.20.18 Time: 9:30 am Location: Craft Room  Group Topic: Goal Setting  Goal Area(s) Addresses:  Patient will write at least one goal. Patient will write at least one obstacle.  Behavioral Response: Did not attend  Intervention: Recovery Goal Chart  Activity: Patients were instructed to make a Recovery Goal Chart including goals, obstacles, the date they started working on their goals, and the date they achieved their goals.  Education: LRT educated patients on healthy ways to celebrate achieving their goals.  Education Outcome: Patient did not attend group.   Clinical Observations/Feedback: Patient did not attend group.  Jacquelynn CreeGreene,Maryjane Benedict M, LRT/CTRS 02/09/2017 10:00 AM

## 2017-02-10 ENCOUNTER — Other Ambulatory Visit: Payer: Self-pay | Admitting: Psychiatry

## 2017-02-10 ENCOUNTER — Inpatient Hospital Stay: Payer: Medicare Other | Admitting: Anesthesiology

## 2017-02-10 LAB — GLUCOSE, CAPILLARY: GLUCOSE-CAPILLARY: 106 mg/dL — AB (ref 65–99)

## 2017-02-10 MED ORDER — TEMAZEPAM 15 MG PO CAPS
15.0000 mg | ORAL_CAPSULE | Freq: Every day | ORAL | Status: DC
  Administered 2017-02-10 – 2017-02-11 (×2): 15 mg via ORAL
  Filled 2017-02-10 (×2): qty 1

## 2017-02-10 MED ORDER — SODIUM CHLORIDE 0.9 % IV SOLN
INTRAVENOUS | Status: DC | PRN
  Administered 2017-02-10: 10:00:00 via INTRAVENOUS

## 2017-02-10 MED ORDER — SUCCINYLCHOLINE CHLORIDE 20 MG/ML IJ SOLN
INTRAMUSCULAR | Status: DC | PRN
  Administered 2017-02-10: 120 mg via INTRAVENOUS

## 2017-02-10 MED ORDER — SODIUM CHLORIDE 0.9 % IV SOLN
500.0000 mL | Freq: Once | INTRAVENOUS | Status: AC
  Administered 2017-02-10: 1000 mL via INTRAVENOUS

## 2017-02-10 MED ORDER — METHOHEXITAL SODIUM 100 MG/10ML IV SOSY
PREFILLED_SYRINGE | INTRAVENOUS | Status: DC | PRN
  Administered 2017-02-10: 80 mg via INTRAVENOUS

## 2017-02-10 MED ORDER — SUCCINYLCHOLINE CHLORIDE 20 MG/ML IJ SOLN
INTRAMUSCULAR | Status: AC
  Filled 2017-02-10: qty 1

## 2017-02-10 NOTE — Plan of Care (Signed)
Problem: Education: Goal: Knowledge of [facility] Education information/materials will improve Outcome: Progressing Verbalize  Understanding  Of education

## 2017-02-10 NOTE — Anesthesia Postprocedure Evaluation (Signed)
Anesthesia Post Note  Patient: Kyle [name]  Procedure(s) Performed: * No procedures listed *  Patient location during evaluation: PACU Anesthesia Type: General Level of consciousness: awake and alert Pain management: pain level controlled Vital Signs Assessment: post-procedure vital signs reviewed and stable Respiratory status: spontaneous breathing, nonlabored ventilation and respiratory function stable Cardiovascular status: blood pressure returned to baseline and stable Postop Assessment: no signs of nausea or vomiting Anesthetic complications: no     Last Vitals:  Vitals:   02/10/17 1138 02/10/17 1155  BP: 121/64 136/65  Pulse: (!) 56 (!) 56  Resp: 18 16  Temp:  36.7 C    Last Pain:  Vitals:   02/10/17 1155  TempSrc: Temporal  PainSc:                  Kurt Hoffmeier

## 2017-02-10 NOTE — Plan of Care (Signed)
Problem: Education: Goal: Ability to make informed decisions regarding treatment will improve Outcome: Progressing Pt is progressing towards accomplishing this goal.

## 2017-02-10 NOTE — Anesthesia Post-op Follow-up Note (Cosign Needed)
Anesthesia QCDR form completed.        

## 2017-02-10 NOTE — Procedures (Signed)
ECT SERVICES Physician's Interval Evaluation & Treatment Note  Patient Identification: Kyle [name] MRN:  [id number] Date of Evaluation:  02/10/2017 TX #: [id number]  MADRS:   MMSE:   P.E. Findings:  No change to physical exam vitals unremarkable lungs and heart clear  Psychiatric Interval Note:  Mood seems to be getting somewhat better more active  Subjective:  Patient is a 71 y.o. male seen for evaluation for Electroconvulsive Therapy. Subjectively feels more energetic  Treatment Summary:   [x]   Right Unilateral             []  Bilateral   % Energy : 0.3 ms 100%   Impedance: 920 ohms  Seizure Energy Index: 23,476 V squared  Postictal Suppression Index: 85%  Seizure Concordance Index: 68%  Medications  Pre Shock: Brevital 80 mg succinylcholine 100 mg  Post Shock:    Seizure Duration: 12 seconds by EMG, 33 seconds by EEG   Comments: Follow-up on Friday   Lungs:  [x]   Clear to auscultation               []  Other:   Heart:    [x]   Regular rhythm             []  irregular rhythm    [x]   Previous H&P reviewed, patient examined and there are NO CHANGES                 []   Previous H&P reviewed, patient examined and there are changes noted.   Kyle RasmussenJohn Clapacs, MD 3/21/201811:07 AM

## 2017-02-10 NOTE — Anesthesia Preprocedure Evaluation (Signed)
Anesthesia Evaluation  Patient identified by MRN, date of birth, ID band Patient awake    Reviewed: Allergy & Precautions, NPO status , Patient's Chart, lab work & pertinent test results  History of Anesthesia Complications Negative for: history of anesthetic complications  Airway Mallampati: III       Dental  (+) Poor Dentition, Missing   Pulmonary neg pulmonary ROS, neg sleep apnea, neg COPD,    breath sounds clear to auscultation- rhonchi (-) decreased breath sounds(-) wheezing      Cardiovascular Exercise Tolerance: Good hypertension, Pt. on medications and Pt. on home beta blockers (-) CAD and (-) Past MI  Rhythm:Regular Rate:Normal - Systolic murmurs and - Diastolic murmurs    Neuro/Psych PSYCHIATRIC DISORDERS Depression Bipolar Disorder  Neuromuscular disease    GI/Hepatic negative GI ROS, Neg liver ROS,   Endo/Other  diabetes, Type 2  Renal/GU      Musculoskeletal negative musculoskeletal ROS (+)   Abdominal (+) - obese,   Peds negative pediatric ROS (+)  Hematology negative hematology ROS (+)   Anesthesia Other Findings Past Medical History: No date: Bipolar 1 disorder (HCC) No date: BPH (benign prostatic hyperplasia) No date: Cerebrovascular disease No date: HTN No date: Hyperlipidemia No date: Neuropathy (HCC) No date: SVT (supraventricular tachycardia) (HCC) No date: Tremor, essential     Comment: only to the hands No date: Type 2 diabetes mellitus (HCC)    Reproductive/Obstetrics                             Anesthesia Physical  Anesthesia Plan  ASA: III  Anesthesia Plan: General   Post-op Pain Management:    Induction: Intravenous  Airway Management Planned: Mask  Additional Equipment:   Intra-op Plan:   Post-operative Plan:   Informed Consent: I have reviewed the patients History and Physical, chart, labs and discussed the procedure including the  risks, benefits and alternatives for the proposed anesthesia with the patient or authorized representative who has indicated his/her understanding and acceptance.     Plan Discussed with: CRNA and Anesthesiologist  Anesthesia Plan Comments:         Anesthesia Quick Evaluation

## 2017-02-10 NOTE — Tx Team (Signed)
Interdisciplinary Treatment and Diagnostic Plan Update  02/10/2017 Time of Session: 1515 Kyle [name] MRN: [id number]  Principal Diagnosis: Bipolar 1 disorder, depressed (HCC)  Secondary Diagnoses: Principal Problem:   Bipolar 1 disorder, depressed (HCC) Active Problems:   Hypertension   Current Medications:  Current Facility-Administered Medications  Medication Dose Route Frequency Provider Last Rate Last Dose  . acetaminophen (TYLENOL) tablet 650 mg  650 mg Oral Q6H PRN Audery AmelJohn T Clapacs, MD   650 mg at 02/10/17 1226  . alum & mag hydroxide-simeth (MAALOX/MYLANTA) 200-200-20 MG/5ML suspension 30 mL  30 mL Oral Q4H PRN Audery AmelJohn T Clapacs, MD      . aspirin EC tablet 81 mg  81 mg Oral Daily Audery AmelJohn T Clapacs, MD   81 mg at 02/10/17 1226  . fentaNYL (SUBLIMAZE) injection 25 mcg  25 mcg Intravenous Q5 min PRN Berdine AddisonMathai Thomas, MD      . finasteride (PROSCAR) tablet 5 mg  5 mg Oral Daily Audery AmelJohn T Clapacs, MD   5 mg at 02/10/17 1226  . FLUoxetine (PROZAC) capsule 20 mg  20 mg Oral Daily Audery AmelJohn T Clapacs, MD   20 mg at 02/10/17 1227  . magnesium hydroxide (MILK OF MAGNESIA) suspension 30 mL  30 mL Oral Daily PRN Audery AmelJohn T Clapacs, MD      . metoprolol tartrate (LOPRESSOR) tablet 25 mg  25 mg Oral BID Audery AmelJohn T Clapacs, MD   25 mg at 02/10/17 0650  . OLANZapine (ZYPREXA) tablet 15 mg  15 mg Oral QHS Audery AmelJohn T Clapacs, MD   15 mg at 02/09/17 2153  . ondansetron (ZOFRAN) injection 4 mg  4 mg Intravenous Once PRN Berdine AddisonMathai Thomas, MD      . simvastatin (ZOCOR) tablet 10 mg  10 mg Oral q1800 Audery AmelJohn T Clapacs, MD   10 mg at 02/09/17 1718   PTA Medications: Prescriptions Prior to Admission  Medication Sig Dispense Refill Last Dose  . aspirin 81 MG chewable tablet Chew by mouth daily.   02/09/2017  . feeding supplement, ENSURE ENLIVE, (ENSURE ENLIVE) LIQD Take 237 mLs by mouth 3 (three) times daily with meals. 237 mL 12 02/09/2017  . finasteride (PROSCAR) 5 MG tablet Take 1 tablet (5 mg total) by mouth daily. 30 tablet 0  02/09/2017  . metoprolol tartrate (LOPRESSOR) 25 MG tablet Take 25 mg by mouth 2 (two) times daily.   02/10/2017 at 0756  . OLANZapine zydis (ZYPREXA) 10 MG disintegrating tablet Take 1 tablet (10 mg total) by mouth at bedtime. 30 tablet 0 02/09/2017  . simvastatin (ZOCOR) 10 MG tablet Take 1 tablet (10 mg total) by mouth daily at 6 PM. 30 tablet 0 02/09/2017  . tamsulosin (FLOMAX) 0.4 MG CAPS capsule Take 1 capsule (0.4 mg total) by mouth daily. 30 capsule 0 02/09/2017    Patient Stressors: Health problems Marital or family conflict Medication change or noncompliance  Patient Strengths: Ability for insight Capable of independent living Communication skills Supportive family/friends  Treatment Modalities: Medication Management, Group therapy, Case management,  1 to 1 session with clinician, Psychoeducation, Recreational therapy.   Physician Treatment Plan for Primary Diagnosis: Bipolar 1 disorder, depressed (HCC) Long Term Goal(s): Improvement in symptoms so as ready for discharge Improvement in symptoms so as ready for discharge   Short Term Goals: Ability to verbalize feelings will improve Ability to disclose and discuss suicidal ideas Ability to identify and develop effective coping behaviors will improve Ability to maintain clinical measurements within normal limits will improve  Medication Management: Evaluate patient's  response, side effects, and tolerance of medication regimen.  Therapeutic Interventions: 1 to 1 sessions, Unit Group sessions and Medication administration.  Evaluation of Outcomes: Progressing  Physician Treatment Plan for Secondary Diagnosis: Principal Problem:   Bipolar 1 disorder, depressed (HCC) Active Problems:   Hypertension  Long Term Goal(s): Improvement in symptoms so as ready for discharge Improvement in symptoms so as ready for discharge   Short Term Goals: Ability to verbalize feelings will improve Ability to disclose and discuss suicidal  ideas Ability to identify and develop effective coping behaviors will improve Ability to maintain clinical measurements within normal limits will improve     Medication Management: Evaluate patient's response, side effects, and tolerance of medication regimen.  Therapeutic Interventions: 1 to 1 sessions, Unit Group sessions and Medication administration.  Evaluation of Outcomes: Progressing   RN Treatment Plan for Primary Diagnosis: Bipolar 1 disorder, depressed (HCC) Long Term Goal(s): Knowledge of disease and therapeutic regimen to maintain health will improve  Short Term Goals: Ability to remain free from injury will improve, Ability to verbalize feelings will improve, Compliance with prescribed medications will improve and perform ADLs, improved appetite, decreased depression.  Medication Management: RN will administer medications as ordered by provider, will assess and evaluate patient's response and provide education to patient for prescribed medication. RN will report any adverse and/or side effects to prescribing provider.  Therapeutic Interventions: 1 on 1 counseling sessions, Psychoeducation, Medication administration, Evaluate responses to treatment, Monitor vital signs and CBGs as ordered, Perform/monitor CIWA, COWS, AIMS and Fall Risk screenings as ordered, Perform wound care treatments as ordered.  Evaluation of Outcomes: Progressing   LCSW Treatment Plan for Primary Diagnosis: Bipolar 1 disorder, depressed (HCC) Long Term Goal(s): Safe transition to appropriate next level of care at discharge, Engage patient in therapeutic group addressing interpersonal concerns.  Short Term Goals: Engage patient in aftercare planning with referrals and resources and Increase skills for wellness and recovery  Therapeutic Interventions: Assess for all discharge needs, 1 to 1 time with Social worker, Explore available resources and support systems, Assess for adequacy in community support  network, Educate family and significant other(s) on suicide prevention, Complete Psychosocial Assessment, Interpersonal group therapy.  Evaluation of Outcomes: Progressing   Progress in Treatment: Attending groups: Yes Participating in groups: yes Taking medication as prescribed: Yes. Toleration medication: Yes. Family/Significant other contact made: Yes, individual(s) contacted:  Anthoney Harada, sister Patient understands diagnosis: Yes. Discussing patient identified problems/goals with staff: Yes. Medical problems stabilized or resolved: Yes. Denies suicidal/homicidal ideation: Yes. Issues/concerns per patient self-inventory: No. Other: none  New problem(s) identified: No, Describe:  none  New Short Term/Long Term Goal(s):  Discharge Plan or Barriers: Pt in process of being referred to assisted living.  Will continue outpt ECT treatments as well as follow up with [facility].  Reason for Continuation of Hospitalization: Depression  Estimated Length of Stay: 3-5 days.  Attendees: Patient: Neal Trulson 02/10/2017 3:29 PM  Physician: Dr Toni Amend, MD 02/10/2017 3:29 PM  Nursing: Leonia Reader, RN 02/10/2017 3:29 PM  RN Care Manager: 02/10/2017 3:29 PM  Social Worker: Daleen Squibb, LCSW 02/10/2017 3:29 PM  Recreational Therapist:  Hershal Coria, LRT/CTRS  02/10/2017 3:29 PM  Other:  02/10/2017 3:29 PM  Other:  02/10/2017 3:29 PM  Other: 02/10/2017 3:29 PM    Scribe for Treatment Team: Lorri Frederick, LCSW 02/10/2017 3:29 PM

## 2017-02-10 NOTE — [facility] Group Notes (Signed)
  [facility] LCSW Group Therapy Note  Date/Time: 02/10/17, 1400  Type of Therapy/Topic:  Group Therapy:  Emotion Regulation  Participation Level:  Minimal   Mood: pleasant  Description of Group:    The purpose of this group is to assist patients in learning to regulate negative emotions and experience positive emotions. Patients will be guided to discuss ways in which they have been vulnerable to their negative emotions. These vulnerabilities will be juxtaposed with experiences of positive emotions or situations, and patients challenged to use positive emotions to combat negative ones. Special emphasis will be placed on coping with negative emotions in conflict situations, and patients will process healthy conflict resolution skills.  Therapeutic Goals: 1. Patient will identify two positive emotions or experiences to reflect on in order to balance out negative emotions:  2. Patient will label two or more emotions that they find the most difficult to experience:  3. Patient will be able to demonstrate positive conflict resolution skills through discussion or role plays:   Summary of Patient Progress: Pt attended group for first time with this CSW.  He made minimal contributions to the discussion, but did identify anger as a difficult emotion for him to experience.  He responded to CSW questions and was appropriate.      Therapeutic Modalities:   Cognitive Behavioral Therapy Feelings Identification Dialectical Behavioral Therapy  Daleen SquibbGreg Tabithia Stroder, LCSW

## 2017-02-10 NOTE — Progress Notes (Signed)
Recreation Therapy Notes  Date: 03.21.18 Time: 9:30 am Location: Craft Room  Group Topic: Self-esteem  Goal Area(s) Addresses:  Patient will write at least one positive trait. Patient will write at least one healthy coping skill.  Behavioral Response: Did not attend  Intervention: All About Me  Activity: Patients were instructed to make an All About Me pamphlet including their life motto, positive traits, healthy coping skills, and their healthy support system.  Education: LRT educated patients on ways to increase their self-esteem.  Education Outcome: Patient did not attend group.  Clinical Observations/Feedback: Patient at ECT treatment during group.  Jacquelynn CreeGreene,Nikaela Coyne M, LRT/CTRS 02/10/2017 10:00 AM
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
## 2017-02-10 NOTE — Transfer of Care (Signed)
Immediate Anesthesia Transfer of Care Note  Patient: Sheryle SprayMarvin D Simko  Procedure(s) Performed: ECT   Patient Location: PACU  Anesthesia Type:General  Level of Consciousness: awake  Airway & Oxygen Therapy: Patient connected to face mask oxygen  Post-op Assessment: Post -op Vital signs reviewed and stable  Post vital signs: stable  Last Vitals:  Vitals:   02/10/17 0924 02/10/17 1127  BP: 111/63 132/64  Pulse: (!) 52 (!) 55  Resp: 18 12  Temp: 36.8 C     Last Pain:  Vitals:   02/10/17 0935  TempSrc:   PainSc: 0-No pain      Patients Stated Pain Goal: 0 (02/09/17 2150)  Complications: No apparent anesthesia complications

## 2017-02-10 NOTE — Anesthesia Procedure Notes (Signed)
Procedure Name: LMA Insertion Date/Time: 02/10/2017 11:17 AM Performed by: Irving BurtonBACHICH, Gaetan Spieker Pre-anesthesia Checklist: Patient identified, Emergency Drugs available, Suction available and Patient being monitored Patient Re-evaluated:Patient Re-evaluated prior to inductionOxygen Delivery Method: Circle system utilized Preoxygenation: Pre-oxygenation with 100% oxygen Ventilation: Mask ventilation with difficulty LMA: LMA inserted LMA Size: 4.0 Placement Confirmation: positive ETCO2 and breath sounds checked- equal and bilateral Tube secured with: Tape Dental Injury: Teeth and Oropharynx as per pre-operative assessment

## 2017-02-10 NOTE — Plan of Care (Signed)
Problem: Activity: Goal: Sleeping patterns will improve Outcome: Not Met (add Reason) patient slept 6 hours. Med compliant. c/o H/A. Tylenol 650 mg po given PRN. Visible in dayroom. No behavior issues noted. Safety maintained with q 15 min checks.
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
## 2017-02-11 NOTE — [facility] Group Notes (Signed)
Goals Group Date/Time: 02/11/2017 9:00 AM Type of Therapy and Topic: Group Therapy: Goals Group: SMART Goals   Participation Level: Moderate  Description of Group:    The purpose of a daily goals group is to assist and guide patients in setting recovery/wellness-related goals. The objective is to set goals as they relate to the crisis in which they were admitted. Patients will be using SMART goal modalities to set measurable goals. Characteristics of realistic goals will be discussed and patients will be assisted in setting and processing how one will reach their goal. Facilitator will also assist patients in applying interventions and coping skills learned in psycho-education groups to the SMART goal and process how one will achieve defined goal.   Therapeutic Goals:   -Patients will develop and document one goal related to or their crisis in which brought them into treatment.  -Patients will be guided by LCSW using SMART goal setting modality in how to set a measurable, attainable, realistic and time sensitive goal.  -Patients will process barriers in reaching goal.  -Patients will process interventions in how to overcome and successful in reaching goal.   Patient's Goal: Pt reports his goal is to talk to his MD about discharge and to come to groups.   Therapeutic Modalities:  Motivational Interviewing  Research officer, political partyCognitive Behavioral Therapy  Crisis Intervention Model  SMART goals setting   Daleen SquibbGreg Anyelin Mogle, [place]

## 2017-02-11 NOTE — [facility] Group Notes (Signed)
[facility] LCSW Group Therapy Note  Date/Time: 02/11/17, 1300  Type of Therapy/Topic:  Group Therapy:  Balance in Life  Participation Level: Pt did not attend group.   Description of Group:    This group will address the concept of balance and how it feels and looks when one is unbalanced. Patients will be encouraged to process areas in their lives that are out of balance, and identify reasons for remaining unbalanced. Facilitators will guide patients utilizing problem- solving interventions to address and correct the stressor making their life unbalanced. Understanding and applying boundaries will be explored and addressed for obtaining  and maintaining a balanced life. Patients will be encouraged to explore ways to assertively make their unbalanced needs known to significant others in their lives, using other group members and facilitator for support and feedback.  Therapeutic Goals: 1. Patient will identify two or more emotions or situations they have that consume much of in their lives. 2. Patient will identify signs/triggers that life has become out of balance:  3. Patient will identify two ways to set boundaries in order to achieve balance in their lives:  4. Patient will demonstrate ability to communicate their needs through discussion and/or role plays  Summary of Patient Progress:          Therapeutic Modalities:   Cognitive Behavioral Therapy Solution-Focused Therapy Assertiveness Training  Greg Yoon Barca, LCSW 
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
## 2017-02-11 NOTE — Progress Notes (Signed)
Pt was assessed by Algie CofferLeslie Bland for his PASSR screening.  Ms. Parke SimmersBland said she will recommend approval and will submit her report this afternoon to the state.  Not sure how long they take to process it. Garner NashGregory Alaa Eyerman, MSW, LCSW Clinical Social Worker 02/11/2017 2:59 PM

## 2017-02-11 NOTE — Progress Notes (Signed)
Pt is, alert and visible in milieu. He was out in the community during the evening hours. Pt denies any SI/HI/VAH, remains compliant with medications, no concerns voiced, will continue to monitor for safety.

## 2017-02-11 NOTE — Plan of Care (Signed)
Problem: Coping: Goal: Ability to cope will improve Outcome: Progressing Continue to  Use coping skills  , hand out given

## 2017-02-11 NOTE — Progress Notes (Signed)
Recreation Therapy Notes  Date: 03.22.18 Time: 9:30 am Location: Craft Room  Group Topic: Leisure Education  Goal Area(s) Addresses:  Patient will identify things they are grateful for. Patient will identify how being grateful can influence decision-making.  Behavioral Response: Attentive, Interactive  Intervention: Grateful Wheel  Activity: Patients were given an I Am Grateful For worksheet and were instructed to write things they were grateful for under each category.  Education: LRT educated patients on why it is important to be grateful.  Education Outcome: In group clarification offered  Clinical Observations/Feedback: Patient wrote things he was grateful for. Patient contributed to group discussion by stating things he is grateful for.  Jacquelynn CreeGreene,Carmelo Reidel M, LRT/CTRS 02/11/2017 10:09 AM

## 2017-02-12 ENCOUNTER — Inpatient Hospital Stay: Payer: Medicare Other

## 2017-02-12 ENCOUNTER — Other Ambulatory Visit: Payer: Self-pay | Admitting: Psychiatry

## 2017-02-12 ENCOUNTER — Inpatient Hospital Stay: Payer: Medicare Other | Admitting: Anesthesiology

## 2017-02-12 LAB — GLUCOSE, CAPILLARY: Glucose-Capillary: 98 mg/dL (ref 65–99)

## 2017-02-12 MED ORDER — TUBERCULIN PPD 5 UNIT/0.1ML ID SOLN
5.0000 [IU] | Freq: Once | INTRADERMAL | Status: DC
  Administered 2017-02-12: 5 [IU] via INTRADERMAL
  Filled 2017-02-12 (×2): qty 0.1

## 2017-02-12 MED ORDER — SUCCINYLCHOLINE CHLORIDE 20 MG/ML IJ SOLN
INTRAMUSCULAR | Status: AC
  Filled 2017-02-12: qty 1

## 2017-02-12 MED ORDER — FLUOXETINE HCL 20 MG PO CAPS: 20.0000 mg | ORAL_CAPSULE | Freq: Every day | ORAL | 1 refills | Status: DC

## 2017-02-12 MED ORDER — METOPROLOL TARTRATE 25 MG PO TABS: 25.0000 mg | ORAL_TABLET | Freq: Two times a day (BID) | ORAL | 1 refills | Status: DC

## 2017-02-12 MED ORDER — SODIUM CHLORIDE 0.9 % IV SOLN
500.0000 mL | Freq: Once | INTRAVENOUS | Status: AC
  Administered 2017-02-12: 1000 mL via INTRAVENOUS

## 2017-02-12 MED ORDER — TEMAZEPAM 15 MG PO CAPS: 15.0000 mg | ORAL_CAPSULE | Freq: Every day | ORAL | 0 refills | Status: DC

## 2017-02-12 MED ORDER — SODIUM CHLORIDE 0.9 % IV SOLN
INTRAVENOUS | Status: DC | PRN
  Administered 2017-02-12: 11:00:00 via INTRAVENOUS

## 2017-02-12 MED ORDER — ASPIRIN 81 MG PO TBEC: 81.0000 mg | DELAYED_RELEASE_TABLET | Freq: Every day | ORAL | 1 refills | Status: DC

## 2017-02-12 MED ORDER — METHOHEXITAL SODIUM 100 MG/10ML IV SOSY
PREFILLED_SYRINGE | INTRAVENOUS | Status: DC | PRN
  Administered 2017-02-12: 80 mg via INTRAVENOUS

## 2017-02-12 MED ORDER — SIMVASTATIN 10 MG PO TABS: 10.0000 mg | ORAL_TABLET | Freq: Every day | ORAL | 1 refills | Status: DC

## 2017-02-12 MED ORDER — OLANZAPINE 15 MG PO TABS: 15.0000 mg | ORAL_TABLET | Freq: Every day | ORAL | 1 refills | Status: DC

## 2017-02-12 MED ORDER — FINASTERIDE 5 MG PO TABS: 5.0000 mg | ORAL_TABLET | Freq: Every day | ORAL | 1 refills | Status: DC

## 2017-02-12 MED ORDER — SUCCINYLCHOLINE CHLORIDE 200 MG/10ML IV SOSY
PREFILLED_SYRINGE | INTRAVENOUS | Status: DC | PRN
  Administered 2017-02-12: 120 mg via INTRAVENOUS
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
## 2017-02-12 NOTE — [facility] Suicide Risk Assessment (Signed)
[facility]   Principal Problem: Bipolar 1 disorder, depressed [facility](HCC) Discharge Diagnoses:  Patient Active Problem List   Diagnosis Date Noted  . Bipolar 1 disorder, depressed (HCC) [F31.9] 02/03/2017  . 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 Diabetes (HCC) [E11.9] 01/26/2017  . Suicide attempt [T14.91XA]   . SVT (supraventricular tachycardia) (HCC) [I47.1]   . Noncompliance [Z91.19] 09/26/2015  . RLS (restless legs syndrome) [G25.81] 08/08/2015  . Peripheral neuropathy (HCC) [G62.9] 08/08/2015  . Cerebrovascular disease [I67.9] 08/07/2015  . 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 Essential tremor [G25.0] 08/01/2015  . Hypertension [I10] 04/17/2015    Total Time spent with patient: 45 minutes  Musculoskeletal: Strength & Muscle Tone: within normal limits Gait & Station: normal Patient leans: N/A  Psychiatric Specialty Exam: Review of Systems  Constitutional: Negative.   HENT: Negative.   Eyes: Negative.   Respiratory: Negative.   Cardiovascular: Negative.   Gastrointestinal: Negative.   Musculoskeletal: Negative.   Skin: Negative.   Neurological: Negative.   Psychiatric/Behavioral: Positive for memory loss. Negative for depression, hallucinations, substance abuse and suicidal ideas. The patient is not nervous/anxious and does not have insomnia.     Blood pressure 128/66, pulse (!) 56, temperature 98.6 F (37 C), temperature source Temporal, resp. rate 20, height 5\' 8"  (1.727 m), weight 91.6 kg (202 lb), SpO2 97 %.Body mass index is 30.71 kg/m.  General Appearance: Casual  Eye Contact::  Fair  Speech:  Slow409  Volume:  Decreased  Mood:  Euthymic  Affect:  Constricted  Thought Process:  Goal Directed  Orientation:  Full (Time, Place, and Person)  Thought Content:  Logical  Suicidal Thoughts:  No  Homicidal Thoughts:  No  Memory:  Immediate;   Fair Recent;   Fair Remote;   Fair  Judgement:  Fair   Insight:  Fair  Psychomotor Activity:  Decreased  Concentration:  Fair  Recall:  [employer]  Fund of Knowledge:Fair  Language: Fair  Akathisia:  No  Handed:  Right  AIMS (if indicated):     Assets:  Desire for Improvement Financial Resources/Insurance Housing  Sleep:  Number of Hours: 5.3  Cognition: Impaired,  Mild  ADL's:  Intact   Mental Status Per Nursing Assessment::   On Admission:     Demographic Factors:  Male, Age 70 or older, Divorced or widowed, Caucasian, Low socioeconomic status, Living alone and Unemployed  Loss Factors: Loss of significant relationship, Decline in physical health and Financial problems/change in socioeconomic status  Historical Factors: Prior suicide attempts and Family history of suicide  Risk Reduction Factors:   Religious beliefs about death, Living with another person, especially a relative, Positive social support and Positive therapeutic relationship  Continued Clinical Symptoms:  Bipolar Disorder:   Depressive phase  Cognitive Features That Contribute To Risk:  Closed-mindedness and Loss of executive function    Suicide Risk:  Moderate:  Frequent suicidal ideation with limited intensity, and duration, some specificity in terms of plans, no associated intent, good self-control, limited dysphoria/symptomatology, some risk factors present, and identifiable protective factors, including available and accessible social support.  Follow-up Information    [facility] Go on 02/18/2017.   Why:  [facility] on 02/18/17, 10:40am.  Please bring photo ID, copy of your hospital discharge paperwork, and your medicare card. Contact information: [address] [place] [address] [phone]        [facility]. Go on 02/24/2017.   Why:  Please attend your follow up ECT appointment on February 24, 2017 at 8:30am at [facility]. Contact information: [id number] [place] [address] [phone]          Plan Of Care/Follow-up recommendations:  Activity:  Activity as tolerated Diet:  Regular diet Other:  Patient will follow-up with ECT as well as medicine management. Due to this patient's history of multiple prior suicide attempts and history of bipolar disorder going back decades he is at chronic elevated risk of suicide, however at this point he appears to be safe for discharge to a living environment outside the hospital  [name], MD 02/12/2017, 1:07 PM

## 2017-02-12 NOTE — Anesthesia Procedure Notes (Signed)
Procedure Name: LMA Insertion Date/Time: 02/12/2017 11:21 AM Performed by: Lily KocherPERALTA, Kyle Pagliarulo Pre-anesthesia Checklist: Patient identified, Patient being monitored, Timeout performed, Emergency Drugs available and Suction available Patient Re-evaluated:Patient Re-evaluated prior to inductionOxygen Delivery Method: Circle system utilized Preoxygenation: Pre-oxygenation with 100% oxygen Intubation Type: IV induction Ventilation: Mask ventilation without difficulty LMA: LMA inserted LMA Size: 4.0 Tube type: Oral Number of attempts: 1 Placement Confirmation: positive ETCO2 and breath sounds checked- equal and bilateral Tube secured with: Tape Dental Injury: Teeth and Oropharynx as per pre-operative assessment

## 2017-02-12 NOTE — Progress Notes (Signed)
Patient appropriate for discharge to [facility]. PPD placed to left arm prior to discharge. Denies SI.HI.AVH. Paperwork reviewed with patient and family. He is agreeable to discharge and has no complaints. Left at 1753 with all belongings, transportation provided by his family.

## 2017-02-12 NOTE — Transfer of Care (Signed)
Immediate Anesthesia Transfer of Care Note  Patient: Kyle [name]  Procedure(s) Performed: ECT  Patient Location: PACU  Anesthesia Type:General  Level of Consciousness: sedated  Airway & Oxygen Therapy: Patient Spontanous Breathing and Patient connected to face mask oxygen  Post-op Assessment: Report given to RN and Post -op Vital signs reviewed and stable  Post vital signs: Reviewed and stable  Last Vitals:  Vitals:   02/12/17 0916 02/12/17 1132  BP: 108/61 (!) 152/73  Pulse: (!) 49 65  Resp:  18  Temp: 36.6 C 37 C    Last Pain:  Vitals:   02/12/17 0919  TempSrc:   PainSc: 0-No pain      Patients Stated Pain Goal: 0 (02/10/17 1236)  Complications: No apparent anesthesia complications
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
## 2017-02-12 NOTE — Progress Notes (Signed)
Called pt's sisterClide Cliff( Margaret Williams) with preprocedure instructions for ECT scheduled for Wednesday, April 4th at 830am  after pt is discharged from hospital.  Informed sister he needed to be NPO after midnight on Tuesday, pt needs to take metoprolol the morning of treatment and must register down stairs in medical mall.  Pt twill need photo ID and insurance card to register for ECT.   Claris CheMargaret verbalized understanding of instructions given

## 2017-02-12 NOTE — Procedures (Signed)
ECT SERVICES Physician's Interval Evaluation & Treatment Note  Patient Identification: Suezanne JacquetMarvin D Depoy MRN:  [id number] Date of Evaluation:  02/12/2017 TX #: 6  MADRS:   MMSE:   P.E. Findings:  No change to physical exam  Psychiatric Interval Note:  More alert and interactive and communicative normal affect  Subjective:  Patient is a 71 y.o. male seen for evaluation for Electroconvulsive Therapy. No specific new complaint  Treatment Summary:   [x]   Right Unilateral             []  Bilateral   % Energy : 0.3 ms 100%   Impedance: 1430 ohms  Seizure Energy Index:    Postictal Suppression Index:    Seizure Concordance Index: 58%  Medications  Pre Shock: Brevital 80 mg succinylcholine 100 mg  Post Shock:    Seizure Duration: 14 seconds by EMG 24 seconds by EEG   Comments: Follow-up April 4   Lungs:  [x]   Clear to auscultation               []  Other:   Heart:    [x]   Regular rhythm             []  irregular rhythm    [x]   Previous H&P reviewed, patient examined and there are NO CHANGES                 []   Previous H&P reviewed, patient examined and there are changes noted.   Mordecai RasmussenJohn Darius Fillingim, MD 3/23/201811:12 AM

## 2017-02-12 NOTE — [facility] Group Notes (Signed)
[facility] Group Notes:  (Nursing/MHT/Case Management/Adjunct)  Date:  02/12/2017  Time:  4:28 AM  Type of Therapy:  Psychoeducational Skills  Participation Level:  Active  Participation Quality:  Appropriate and Attentive  Affect:  Appropriate  Cognitive:  Appropriate  Insight:  Appropriate and Good  Engagement in Group:  Engaged  Modes of Intervention:  Discussion, Socialization and Support  Summary of Progress/Problems:  Kyle [name] 02/12/2017, 4:28 AM

## 2017-02-12 NOTE — [facility] Group Notes (Signed)
[facility] LCSW Group Therapy Note  Date/Time: 02/12/17, 1300  Type of Therapy and Topic:  Group Therapy:  Feelings around Relapse and Recovery  Participation Level:  Active   Mood: shy  Description of Group:    Patients in this group will discuss emotions they experience before and after a relapse. They will process how experiencing these feelings, or avoidance of experiencing them, relates to having a relapse. Facilitator will guide patients to explore emotions they have related to recovery. Patients will be encouraged to process which emotions are more powerful. They will be guided to discuss the emotional reaction significant others in their lives may have to patients' relapse or recovery. Patients will be assisted in exploring ways to respond to the emotions of others without this contributing to a relapse.  Therapeutic Goals: 1. Patient will identify two or more emotions that lead to relapse for them:  2. Patient will identify two emotions that result when they relapse:  3. Patient will identify two emotions related to recovery:  4. Patient will demonstrate ability to communicate their needs through discussion and/or role plays.   Summary of Patient Progress: PT was somewhat quiet with multiple nursing students in the room.  Pt identified "depressed" as a feeling that is difficult for him to manage.  He was attentive to the rest of the discussion but did not contribute much today.     Therapeutic Modalities:   Cognitive Behavioral Therapy Solution-Focused Therapy Assertiveness Training Relapse Prevention Therapy  Daleen SquibbGreg Carolyn Sylvia, LCSW

## 2017-02-12 NOTE — Progress Notes (Signed)
D: Pt denies SI/HI/AVH. Patient  Is pleasant and cooperative, affect is bright, she stated he feels better from doing ECT, he appears less anxious and he is interacting with peers and staff appropriately.  A: Pt was offered support and encouragement. Pt was given scheduled medications. Pt was encouraged to attend groups. Q 15 minute checks were done for safety.  R:Pt attends groups and interacts well with peers and staff. Pt is taking medication. Pt has no complaints.Pt receptive to treatment and safety maintained on unit.

## 2017-02-12 NOTE — Tx Team (Signed)
Interdisciplinary Treatment and Diagnostic Plan Update  02/12/2017 Time of Session: 1245 Kyle [name] MRN: [id number]  Principal Diagnosis: Bipolar 1 disorder, depressed (HCC)  Secondary Diagnoses: Principal Problem:   Bipolar 1 disorder, depressed (HCC) Active Problems:   Hypertension   Current Medications:  Current Facility-Administered Medications  Medication Dose Route Frequency Provider Last Rate Last Dose  . acetaminophen (TYLENOL) tablet 650 mg  650 mg Oral Q6H PRN Audery Amel, MD   650 mg at 02/10/17 1226  . alum & mag hydroxide-simeth (MAALOX/MYLANTA) 200-200-20 MG/5ML suspension 30 mL  30 mL Oral Q4H PRN Audery Amel, MD      . aspirin EC tablet 81 mg  81 mg Oral Daily Audery Amel, MD   81 mg at 02/12/17 1225  . finasteride (PROSCAR) tablet 5 mg  5 mg Oral Daily Audery Amel, MD   5 mg at 02/12/17 1225  . FLUoxetine (PROZAC) capsule 20 mg  20 mg Oral Daily Audery Amel, MD   20 mg at 02/12/17 1225  . magnesium hydroxide (MILK OF MAGNESIA) suspension 30 mL  30 mL Oral Daily PRN Audery Amel, MD      . metoprolol tartrate (LOPRESSOR) tablet 25 mg  25 mg Oral BID Audery Amel, MD   25 mg at 02/12/17 0800  . OLANZapine (ZYPREXA) tablet 15 mg  15 mg Oral QHS Audery Amel, MD   15 mg at 02/11/17 2155  . ondansetron (ZOFRAN) injection 4 mg  4 mg Intravenous Once PRN Berdine Addison, MD      . simvastatin (ZOCOR) tablet 10 mg  10 mg Oral q1800 Audery Amel, MD   10 mg at 02/11/17 1738  . temazepam (RESTORIL) capsule 15 mg  15 mg Oral QHS Audery Amel, MD   15 mg at 02/11/17 2156  . tuberculin injection 5 Units  5 Units Intradermal Once Audery Amel, MD       PTA Medications: Prescriptions Prior to Admission  Medication Sig Dispense Refill Last Dose  . aspirin 81 MG chewable tablet Chew by mouth daily.   02/09/2017  . feeding supplement, ENSURE ENLIVE, (ENSURE ENLIVE) LIQD Take 237 mLs by mouth 3 (three) times daily with meals. 237 mL 12 02/09/2017  .  finasteride (PROSCAR) 5 MG tablet Take 1 tablet (5 mg total) by mouth daily. 30 tablet 0 02/09/2017  . metoprolol tartrate (LOPRESSOR) 25 MG tablet Take 25 mg by mouth 2 (two) times daily.   02/12/2017 at 0756  . OLANZapine zydis (ZYPREXA) 10 MG disintegrating tablet Take 1 tablet (10 mg total) by mouth at bedtime. 30 tablet 0 02/09/2017  . simvastatin (ZOCOR) 10 MG tablet Take 1 tablet (10 mg total) by mouth daily at 6 PM. 30 tablet 0 02/09/2017  . tamsulosin (FLOMAX) 0.4 MG CAPS capsule Take 1 capsule (0.4 mg total) by mouth daily. 30 capsule 0 02/09/2017    Patient Stressors: Health problems Marital or family conflict Medication change or noncompliance  Patient Strengths: Ability for insight Capable of independent living Communication skills Supportive family/friends  Treatment Modalities: Medication Management, Group therapy, Case management,  1 to 1 session with clinician, Psychoeducation, Recreational therapy.   Physician Treatment Plan for Primary Diagnosis: Bipolar 1 disorder, depressed (HCC) Long Term Goal(s): Improvement in symptoms so as ready for discharge Improvement in symptoms so as ready for discharge   Short Term Goals: Ability to verbalize feelings will improve Ability to disclose and discuss suicidal ideas Ability to identify and  develop effective coping behaviors will improve Ability to maintain clinical measurements within normal limits will improve  Medication Management: Evaluate patient's response, side effects, and tolerance of medication regimen.  Therapeutic Interventions: 1 to 1 sessions, Unit Group sessions and Medication administration.  Evaluation of Outcomes: Adequate for Discharge  Physician Treatment Plan for Secondary Diagnosis: Principal Problem:   Bipolar 1 disorder, depressed (HCC) Active Problems:   Hypertension  Long Term Goal(s): Improvement in symptoms so as ready for discharge Improvement in symptoms so as ready for discharge   Short  Term Goals: Ability to verbalize feelings will improve Ability to disclose and discuss suicidal ideas Ability to identify and develop effective coping behaviors will improve Ability to maintain clinical measurements within normal limits will improve     Medication Management: Evaluate patient's response, side effects, and tolerance of medication regimen.  Therapeutic Interventions: 1 to 1 sessions, Unit Group sessions and Medication administration.  Evaluation of Outcomes: Adequate for Discharge   RN Treatment Plan for Primary Diagnosis: Bipolar 1 disorder, depressed (HCC) Long Term Goal(s): Knowledge of disease and therapeutic regimen to maintain health will improve  Short Term Goals: Ability to remain free from injury will improve, Ability to verbalize feelings will improve, Compliance with prescribed medications will improve and perform ADLs, improved appetite, decreased depression.  Medication Management: RN will administer medications as ordered by provider, will assess and evaluate patient's response and provide education to patient for prescribed medication. RN will report any adverse and/or side effects to prescribing provider.  Therapeutic Interventions: 1 on 1 counseling sessions, Psychoeducation, Medication administration, Evaluate responses to treatment, Monitor vital signs and CBGs as ordered, Perform/monitor CIWA, COWS, AIMS and Fall Risk screenings as ordered, Perform wound care treatments as ordered.  Evaluation of Outcomes: Adequate for Discharge   LCSW Treatment Plan for Primary Diagnosis: Bipolar 1 disorder, depressed (HCC) Long Term Goal(s): Safe transition to appropriate next level of care at discharge, Engage patient in therapeutic group addressing interpersonal concerns.  Short Term Goals: Engage patient in aftercare planning with referrals and resources and Increase skills for wellness and recovery  Therapeutic Interventions: Assess for all discharge needs, 1 to 1  time with Social worker, Explore available resources and support systems, Assess for adequacy in community support network, Educate family and significant other(s) on suicide prevention, Complete Psychosocial Assessment, Interpersonal group therapy.  Evaluation of Outcomes: Adequate for Discharge   Progress in Treatment: Attending groups: Yes Participating in groups: yes Taking medication as prescribed: Yes. Toleration medication: Yes. Family/Significant other contact made: Yes, individual(s) contacted:  Anthoney HaradaMargaret Ann Williams, sister Patient understands diagnosis: Yes. Discussing patient identified problems/goals with staff: Yes. Medical problems stabilized or resolved: Yes. Denies suicidal/homicidal ideation: Yes. Issues/concerns per patient self-inventory: No. Other: none  New problem(s) identified: No, Describe:  none  New Short Term/Long Term Goal(s):  Discharge Plan or Barriers: Pt in process of being referred to assisted living.  Will continue outpt ECT treatments as well as follow up [facility].  Reason for Continuation of Hospitalization: Pt is discharging today or tomorrow.  Estimated Length of Stay: 0-1 days.  Attendees: Patient:  02/12/2017   Physician: Dr Toni Amendlapacs, MD 02/12/2017   Nursing: Alberteen SamMandy Stone, RN 02/12/2017   RN Care Manager: 02/12/2017   Social Worker: Daleen SquibbGreg Kaesen Rodriguez, LCSW 02/12/2017   Recreational Therapist:   02/12/2017   Other:  02/12/2017   Other:  02/12/2017   Other: 02/12/2017     Scribe for Treatment Team: Lorri FrederickWierda, Jacquelene Kopecky Jon, LCSW 02/12/2017 1:40 PM

## 2017-02-12 NOTE — Progress Notes (Signed)
Patient tolerated ECT.Denies suicidal or homicidal ideations & AV hallucinations.Compliant with medications.Appetite & energy level good.Support & encouragement given.

## 2017-02-12 NOTE — Anesthesia Post-op Follow-up Note (Cosign Needed)
Anesthesia QCDR form completed.        

## 2017-02-12 NOTE — Progress Notes (Signed)
CSW referred pt to several [facility] care homes: [facility], [facility], [facility].  Bard Herbertamika Jackson from [facility] came and visited pt and pt's sister then went to visit both [facility]. Decision made for pt to move to [facility].  Pt needs chest Xray prior to leaving: Dr Toni Amendlapacs agreed to order. Garner NashGregory Beckham Capistran, MSW, LCSW Clinical Social Worker 02/12/2017 4:05 PM

## 2017-02-13 NOTE — Progress Notes (Signed)
  [facility]  Will you be returning to the same living situation after discharge:  No. At discharge, do you have transportation home?: Yes,  Family care home will provide transportation Do you have the ability to pay for your medications: Yes,  patient has insurance  Release of information consent forms completed and in the chart;  Patient's signature needed at discharge.  Patient to Follow up at: Follow-up Information    [facility]. Go on 02/18/2017.   Why:  Please attend your [facility] at [facility] on 02/18/17, 10:40am.  Please bring photo ID, copy of your hospital discharge paperwork, and your medicare card. Contact information: [address] [place] [address] [phone]        [facility]. Go on 02/24/2017.   Why:  Please attend your follow up ECT appointment on February 24, 2017 at 8:30am at [facility]. Contact information: [address] [id number] [place] [id number] [phone]          Next level of care provider has access to [facility] Link:no  Safety Planning and Suicide Prevention discussed: Yes,  with patient and sister  Have you used any form of tobacco in the last 30 days? (Cigarettes, Smokeless Tobacco, Cigars, and/or Pipes): No  Has patient been referred to the Quitline?: N/A patient is not a smoker  Patient has been referred for addiction treatment: Yes  Alexandrina Fiorini G. Garnette CzechSampson MSW, LCSWA 02/13/2017, 10:17 AM

## 2017-02-15 NOTE — Progress Notes (Signed)
  [facility] :  Will you be returning to the same living situation after discharge:  No. At discharge, do you have transportation home?: Yes,  sister Do you have the ability to pay for your medications: Yes,  medicaid  Release of information consent forms completed and in the chart;  Patient's signature needed at discharge.  Patient to Follow up at: Follow-up Information    [facility]. Go on 02/18/2017.   Why:  [facility] on 02/18/17, 10:40am.  Please bring photo ID, copy of your hospital discharge paperwork, and your medicare card. Contact information: [address] [place] [address] [phone]        [facility]. Go on 02/24/2017.   Why:  Please attend your follow up ECT appointment on February 24, 2017 at 8:30am at [facility]. Contact information: [address] [id number] [place] [phone]          Next level of care provider has [facility] Link:yes: [facility], no at [facility]  Safety Planning and Suicide Prevention discussed: Yes,  with sister  Have you used any form of tobacco in the last 30 days? (Cigarettes, Smokeless Tobacco, Cigars, and/or Pipes): No  Has patient been referred to the Quitline?: N/A patient is not a smoker  Patient has been referred for addiction treatment: N/A  Lorri FrederickWierda, Insiya Oshea Jon, LCSW 02/15/2017, 8:14 AM

## 2017-02-15 NOTE — Anesthesia Postprocedure Evaluation (Signed)
Anesthesia Post Note  Patient: Kyle [name]  Procedure(s) Performed: * No procedures listed *  Patient location during evaluation: PACU Anesthesia Type: General Level of consciousness: awake and alert Pain management: pain level controlled Vital Signs Assessment: post-procedure vital signs reviewed and stable Respiratory status: spontaneous breathing, nonlabored ventilation, respiratory function stable and patient connected to nasal cannula oxygen Cardiovascular status: blood pressure returned to baseline and stable Postop Assessment: no signs of nausea or vomiting Anesthetic complications: no     Last Vitals:  Vitals:   02/12/17 1202 02/12/17 1635  BP: 128/66 139/60  Pulse: (!) 56 (!) 55  Resp: 20   Temp:      Last Pain:  Vitals:   02/12/17 1202  TempSrc:   PainSc: 0-No pain                 Yevette EdwardsJames G Rocklin Soderquist

## 2017-02-18 NOTE — Progress Notes (Signed)
Murlean IbaLinda Jennings from [facility] called on 02/17/17.  [phone].  Pt [facility] was requested as SNF and [facility] will need to be [facility] out and resubmitted.  She said old one should be cancelled in 15-20 minutes.  CSW attempted to resubmit later in the day but old PASSR was not cancelled and system would not accept new request.  Bonita QuinLinda contacted and she referred CSW to help desk.  Help desk contacted and reported that they [facility] and she will need to re-return it to them for cancellation.  CSW left message for Murlean IbaLinda Jennings 800am on 02/18/17 requesting this. Garner NashGregory Yoshiaki Kreuser, MSW, LCSW Clinical Social Worker 02/18/2017 8:07 AM

## 2017-02-22 ENCOUNTER — Telehealth: Payer: Self-pay

## 2017-02-24 ENCOUNTER — Other Ambulatory Visit: Payer: Self-pay | Admitting: Psychiatry

## 2017-02-24 ENCOUNTER — Encounter: Payer: Self-pay | Admitting: Anesthesiology

## 2017-02-24 ENCOUNTER — Encounter
Admission: RE | Admit: 2017-02-24 | Discharge: 2017-02-24 | Disposition: A | Discharge status: Home / Self Care | Payer: Medicare Other | Source: Ambulatory Visit | Attending: Psychiatry | Admitting: Psychiatry

## 2017-02-24 DIAGNOSIS — G250 Essential tremor: Secondary | ICD-10-CM | POA: Diagnosis not present

## 2017-02-24 DIAGNOSIS — E785 Hyperlipidemia, unspecified: Secondary | ICD-10-CM | POA: Diagnosis not present

## 2017-02-24 DIAGNOSIS — F332 Major depressive disorder, recurrent severe without psychotic features: Secondary | ICD-10-CM

## 2017-02-24 DIAGNOSIS — N400 Enlarged prostate without lower urinary tract symptoms: Secondary | ICD-10-CM | POA: Insufficient documentation

## 2017-02-24 DIAGNOSIS — E119 Type 2 diabetes mellitus without complications: Secondary | ICD-10-CM | POA: Insufficient documentation

## 2017-02-24 DIAGNOSIS — I471 Supraventricular tachycardia: Secondary | ICD-10-CM | POA: Diagnosis not present

## 2017-02-24 DIAGNOSIS — F339 Major depressive disorder, recurrent, unspecified: Secondary | ICD-10-CM | POA: Diagnosis not present

## 2017-02-24 DIAGNOSIS — I679 Cerebrovascular disease, unspecified: Secondary | ICD-10-CM | POA: Insufficient documentation

## 2017-02-24 DIAGNOSIS — I10 Essential (primary) hypertension: Secondary | ICD-10-CM | POA: Diagnosis not present

## 2017-02-24 MED ORDER — PROPRANOLOL HCL ER 60 MG PO CP24: 60.0000 mg | ORAL_CAPSULE | Freq: Every day | ORAL | 1 refills | Status: DC

## 2017-02-24 MED ORDER — SUCCINYLCHOLINE CHLORIDE 20 MG/ML IJ SOLN
INTRAMUSCULAR | Status: DC | PRN
  Administered 2017-02-24: 120 mg via INTRAVENOUS

## 2017-02-24 MED ORDER — METHOHEXITAL SODIUM 100 MG/10ML IV SOSY
PREFILLED_SYRINGE | INTRAVENOUS | Status: DC | PRN
  Administered 2017-02-24: 80 mg via INTRAVENOUS

## 2017-02-24 MED ORDER — SODIUM CHLORIDE 0.9 % IV SOLN
500.0000 mL | Freq: Once | INTRAVENOUS | Status: AC
  Administered 2017-02-24: 10:00:00 via INTRAVENOUS

## 2017-02-24 MED ORDER — SUCCINYLCHOLINE CHLORIDE 20 MG/ML IJ SOLN
INTRAMUSCULAR | Status: AC
  Filled 2017-02-24: qty 1

## 2017-02-24 MED ORDER — SUCCINYLCHOLINE 20MG/ML (10ML) SYRINGE FOR MEDFUSION PUMP - OPTIME: INTRAMUSCULAR | Status: DC | PRN

## 2017-02-24 NOTE — Procedures (Signed)
ECT SERVICES Physician's Interval Evaluation & Treatment Note  Patient Identification: JUSTICE MILLIRON MRN:  [id number] Date of Evaluation:  02/24/2017 TX #: 7  MADRS:   MMSE:   P.E. Findings:  No change to physical exam. Vital signs stable heart and lungs normal.  Psychiatric Interval Note:  No specific new complaints mood improved no suicidal ideation  Subjective:  Patient is a 71 y.o. male seen for evaluation for Electroconvulsive Therapy. A little bit tired but generally feeling optimistic and good.  Treatment Summary:     Right Unilateral              Bilateral   % Energy : 0.3 ms 100%   Impedance: 1660 ohms  Seizure Energy Index: 3167 V squared  Postictal Suppression Index: 74%  Seizure Concordance Index: 89%  Medications  Pre Shock: Brevital 80 mg, succinylcholine 100 mg  Post Shock:    Seizure Duration: 20 seconds by EMG 29 seconds by EEG   Comments: Follow-up in 2 weeks   Lungs:    Clear to auscultation                Other:   Heart:      Regular rhythm              irregular rhythm      Previous H&P reviewed, patient examined and there are NO CHANGES                   Previous H&P reviewed, patient examined and there are changes noted.   [name], MD 4/4/201811:08 AM

## 2017-02-24 NOTE — Anesthesia Procedure Notes (Signed)
Procedure Name: LMA Insertion Performed by: Casey Burkitt Pre-anesthesia Checklist: Patient identified, Patient being monitored, Timeout performed, Emergency Drugs available and Suction available Patient Re-evaluated:Patient Re-evaluated prior to inductionOxygen Delivery Method: Circle system utilized Preoxygenation: Pre-oxygenation with 100% oxygen Intubation Type: IV induction Ventilation: Two handed mask ventilation required, Oral airway inserted - appropriate to patient size and Mask ventilation without difficulty LMA: LMA inserted LMA Size: 4.0 Tube type: Oral Number of attempts: 1 Placement Confirmation: positive ETCO2 and breath sounds checked- equal and bilateral Tube secured with: Tape Dental Injury: Teeth and Oropharynx as per pre-operative assessment
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
## 2017-02-24 NOTE — Discharge Instructions (Signed)
1)  The drugs that you have been given will stay in your system until tomorrow so for the       next 24 hours you should not:  A. Drive an automobile  B. Make any legal decisions  C. Drink any alcoholic beverages  2)  You may resume your regular meals upon return home.  3)  A responsible adult must take you home.  Someone should stay with you for a few          hours, then be available by phone for the remainder of the treatment day.  4)  You May experience any of the following symptoms:  Headache, Nausea and a dry mouth (due to the medications you were given),  temporary memory loss and some confusion, or sore muscles (a warm bath  should help this).  If you you experience any of these symptoms let us know on                your return visit.  5)  Report any of the following: any acute discomfort, severe headache, or temperature        greater than 100.5 F.   Also report any unusual redness, swelling, drainage, or pain         at your IV site.    You may report Symptoms to:  ECT PROGRAM- [place] at [facility]          Phone: [phone], ECT Department           or Dr. Shary Key office [phone]  6)  Your next ECT Treatment is [facility]  Date April 18TH  We will call 2 days prior to your scheduled appointment for arrival times.  7)  Nothing to eat or drink after midnight the night before your procedure.  8)  Take METOPROLOL   With a sip of water the morning of your procedure.  9)  Other Instructions: Call [phone] to cancel the morning of your procedure due         to illness or emergency. Prescription- Propranolol  given to pt 10) We will call within 72 hours to assess how you are feeling.

## 2017-02-24 NOTE — Transfer of Care (Signed)
Immediate Anesthesia Transfer of Care Note  Patient: Kyle [name]  Procedure(s) Performed: * No procedures listed *  Patient Location: PACU  Anesthesia Type:General  Level of Consciousness: sedated and responds to stimulation  Airway & Oxygen Therapy: Patient Spontanous Breathing and Patient connected to face mask oxygen  Post-op Assessment: Report given to RN and Post -op Vital signs reviewed and stable  Post vital signs: Reviewed and stable  Last Vitals:  Vitals:   02/24/17 0924 02/24/17 1126  BP: (!) 126/56 129/80  Pulse: (!) 49 (!) 54  Resp: 16 (!) 21  Temp: 36.4 C     Last Pain:  Vitals:   02/24/17 0924  TempSrc: Oral         Complications: No apparent anesthesia complications
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
## 2017-02-24 NOTE — Anesthesia Post-op Follow-up Note (Cosign Needed)
Anesthesia QCDR form completed.        

## 2017-02-25 NOTE — Anesthesia Postprocedure Evaluation (Signed)
Anesthesia Post Note  Patient: Kyle [name]  Procedure(s) Performed: * No procedures listed *  Patient location during evaluation: PACU Anesthesia Type: General Level of consciousness: awake and alert Pain management: pain level controlled Vital Signs Assessment: post-procedure vital signs reviewed and stable Respiratory status: spontaneous breathing, nonlabored ventilation, respiratory function stable and patient connected to nasal cannula oxygen Cardiovascular status: blood pressure returned to baseline and stable Postop Assessment: no signs of nausea or vomiting Anesthetic complications: no     Last Vitals:  Vitals:   02/24/17 1155 02/24/17 1211  BP: 133/61 126/71  Pulse: (!) 54 (!) 59  Resp: (!) 24 18  Temp: 36.7 C     Last Pain:  Vitals:   02/24/17 1155  TempSrc:   PainSc: 0-No pain                 [name]
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
## 2017-03-08 ENCOUNTER — Telehealth: Payer: Self-pay | Admitting: *Deleted

## 2017-03-10 ENCOUNTER — Other Ambulatory Visit: Payer: Self-pay | Admitting: Psychiatry

## 2017-03-10 ENCOUNTER — Encounter: Payer: Self-pay | Admitting: Anesthesiology

## 2017-03-10 ENCOUNTER — Ambulatory Visit
Admission: RE | Admit: 2017-03-10 | Discharge: 2017-03-10 | Disposition: A | Discharge status: Home / Self Care | Payer: Medicare Other | Source: Ambulatory Visit | Attending: Psychiatry | Admitting: Psychiatry

## 2017-03-10 DIAGNOSIS — F319 Bipolar disorder, unspecified: Secondary | ICD-10-CM | POA: Diagnosis not present

## 2017-03-10 DIAGNOSIS — F3130 Bipolar disorder, current episode depressed, mild or moderate severity, unspecified: Secondary | ICD-10-CM

## 2017-03-10 DIAGNOSIS — E119 Type 2 diabetes mellitus without complications: Secondary | ICD-10-CM | POA: Insufficient documentation

## 2017-03-10 DIAGNOSIS — I471 Supraventricular tachycardia: Secondary | ICD-10-CM | POA: Insufficient documentation

## 2017-03-10 DIAGNOSIS — E785 Hyperlipidemia, unspecified: Secondary | ICD-10-CM | POA: Insufficient documentation

## 2017-03-10 DIAGNOSIS — I679 Cerebrovascular disease, unspecified: Secondary | ICD-10-CM | POA: Insufficient documentation

## 2017-03-10 DIAGNOSIS — N400 Enlarged prostate without lower urinary tract symptoms: Secondary | ICD-10-CM | POA: Diagnosis not present

## 2017-03-10 DIAGNOSIS — G250 Essential tremor: Secondary | ICD-10-CM | POA: Insufficient documentation

## 2017-03-10 DIAGNOSIS — Z818 Family history of other mental and behavioral disorders: Secondary | ICD-10-CM | POA: Insufficient documentation

## 2017-03-10 MED ORDER — ASPIRIN 81 MG PO TBEC
81.0000 mg | DELAYED_RELEASE_TABLET | Freq: Every day | ORAL | 0 refills | Status: DC
Start: 2017-03-10 — End: 2017-04-12

## 2017-03-10 MED ORDER — METHOHEXITAL SODIUM 100 MG/10ML IV SOSY
PREFILLED_SYRINGE | INTRAVENOUS | Status: DC | PRN
  Administered 2017-03-10: 80 mg via INTRAVENOUS

## 2017-03-10 MED ORDER — SUCCINYLCHOLINE CHLORIDE 200 MG/10ML IV SOSY
PREFILLED_SYRINGE | INTRAVENOUS | Status: DC | PRN
  Administered 2017-03-10: 120 mg via INTRAVENOUS

## 2017-03-10 MED ORDER — OLANZAPINE 15 MG PO TABS: 15.0000 mg | ORAL_TABLET | Freq: Every day | ORAL | 0 refills | Status: DC

## 2017-03-10 MED ORDER — LABETALOL HCL 5 MG/ML IV SOLN
10.0000 mg | Freq: Once | INTRAVENOUS | Status: AC
  Administered 2017-03-10: 10 mg via INTRAVENOUS

## 2017-03-10 MED ORDER — TEMAZEPAM 15 MG PO CAPS: 15.0000 mg | ORAL_CAPSULE | Freq: Every day | ORAL | 0 refills | Status: DC

## 2017-03-10 MED ORDER — SIMVASTATIN 10 MG PO TABS: 10.0000 mg | ORAL_TABLET | Freq: Every day | ORAL | 0 refills | Status: DC

## 2017-03-10 MED ORDER — FLUOXETINE HCL 20 MG PO CAPS: 20.0000 mg | ORAL_CAPSULE | Freq: Every day | ORAL | 0 refills | Status: DC

## 2017-03-10 MED ORDER — SODIUM CHLORIDE 0.9 % IV SOLN
500.0000 mL | Freq: Once | INTRAVENOUS | Status: AC
  Administered 2017-03-10: 1000 mL via INTRAVENOUS

## 2017-03-10 MED ORDER — PROPRANOLOL HCL ER 60 MG PO CP24
60.0000 mg | ORAL_CAPSULE | Freq: Every day | ORAL | 0 refills | Status: DC
Start: 2017-03-10 — End: 2017-05-28

## 2017-03-10 MED ORDER — SODIUM CHLORIDE 0.9 % IV SOLN
INTRAVENOUS | Status: DC | PRN
  Administered 2017-03-10: 11:00:00 via INTRAVENOUS

## 2017-03-10 MED ORDER — FINASTERIDE 5 MG PO TABS: 5.0000 mg | ORAL_TABLET | Freq: Every day | ORAL | 0 refills | Status: DC

## 2017-03-10 MED ORDER — SUCCINYLCHOLINE CHLORIDE 20 MG/ML IJ SOLN
INTRAMUSCULAR | Status: AC
  Filled 2017-03-10: qty 1

## 2017-03-10 MED ORDER — METHOHEXITAL SODIUM 100 MG/10ML IV SOSY
PREFILLED_SYRINGE | INTRAVENOUS | Status: AC
  Filled 2017-03-10: qty 100

## 2017-03-10 MED ORDER — METOPROLOL TARTRATE 25 MG PO TABS: 25.0000 mg | ORAL_TABLET | Freq: Two times a day (BID) | ORAL | 0 refills | Status: DC

## 2017-03-10 NOTE — Discharge Instructions (Signed)
1)  The drugs that you have been given will stay in your system until tomorrow so for the       next 24 hours you should not:  A. Drive an automobile  B. Make any legal decisions  C. Drink any alcoholic beverages  2)  You may resume your regular meals upon return home.  3)  A responsible adult must take you home.  Someone should stay with you for a few          hours, then be available by phone for the remainder of the treatment day.  4)  You May experience any of the following symptoms:  Headache, Nausea and a dry mouth (due to the medications you were given),  temporary memory loss and some confusion, or sore muscles (a warm bath  should help this).  If you you experience any of these symptoms let us know on                your return visit.  5)  Report any of the following: any acute discomfort, severe headache, or temperature        greater than 100.5 F.   Also report any unusual redness, swelling, drainage, or pain         at your IV site.    You may report Symptoms to:  ECT PROGRAM- [place] at [facility]          Phone: [phone], ECT Department           or Dr. Shary Key office [phone]  6)  Your next ECT Treatment is Day Wednesday  Date Mar 24, 2017   We will call 2 days prior to your scheduled appointment for arrival times.  7)  Nothing to eat or drink after midnight the night before your procedure.  8)  Take metoprolol     With a sip of water the morning of your procedure.  9)  Other Instructions: Call [phone] to cancel the morning of your procedure due         to illness or emergency.  10) We will call within 72 hours to assess how you are feeling.

## 2017-03-10 NOTE — Procedures (Signed)
ECT SERVICES Physician's Interval Evaluation & Treatment Note  Patient Identification: Kyle [name] MRN:  [id number] Date of Evaluation:  03/10/2017 TX #: 8  MADRS:   MMSE:   P.E. Findings:  Vitals unremarkable heart and lungs normal. Unremarkable physical exam  Psychiatric Interval Note:  Affect a little blunted which is pretty normal but denies feeling severely depressed no suicidal ideation  Subjective:  Patient is a 71 y.o. male seen for evaluation for Electroconvulsive Therapy. No specific new complaint  Treatment Summary:     Right Unilateral              Bilateral   % Energy : 0.3 ms 100%   Impedance: An thousand 570 ohms  Seizure Energy Index: No reading  Postictal Suppression Index: No reading although it looks pretty good  Seizure Concordance Index: 86%  Medications  Pre Shock: Brevital 80 mg succinylcholine 100 mg  Post Shock:    Seizure Duration: 20 seconds by EMG, 27 seconds by EEG   Comments: Follow-up in 2 weeks   Lungs:    Clear to auscultation                Other:   Heart:      Regular rhythm              irregular rhythm      Previous H&P reviewed, patient examined and there are NO CHANGES                   Previous H&P reviewed, patient examined and there are changes noted.   Kyle Rasmussen, MD 4/18/201810:33 AM

## 2017-03-10 NOTE — Anesthesia Procedure Notes (Signed)
Date/Time: 03/10/2017 10:41 AM Performed by: Lily Kocher Pre-anesthesia Checklist: Patient identified, Emergency Drugs available, Suction available and Patient being monitored Patient Re-evaluated:Patient Re-evaluated prior to inductionOxygen Delivery Method: Circle system utilized Preoxygenation: Pre-oxygenation with 100% oxygen Intubation Type: IV induction Ventilation: Mask ventilation without difficulty and Mask ventilation throughout procedure Airway Equipment and Method: Bite block Placement Confirmation: positive ETCO2 Dental Injury: Teeth and Oropharynx as per pre-operative assessment
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
## 2017-03-10 NOTE — Anesthesia Postprocedure Evaluation (Signed)
Anesthesia Post Note  Patient: Kyle [name]  Procedure(s) Performed: * No procedures listed *  Patient location during evaluation: PACU Anesthesia Type: General Level of consciousness: awake and alert Pain management: pain level controlled Vital Signs Assessment: post-procedure vital signs reviewed and stable Respiratory status: spontaneous breathing, nonlabored ventilation, respiratory function stable and patient connected to nasal cannula oxygen Cardiovascular status: blood pressure returned to baseline and stable Postop Assessment: no signs of nausea or vomiting Anesthetic complications: no     Last Vitals:  Vitals:   03/10/17 1119 03/10/17 1132  BP: 131/60 (!) 110/98  Pulse: (!) 50 (!) 56  Resp:  18  Temp:      Last Pain:  Vitals:   03/10/17 1119  TempSrc:   PainSc: 0-No pain                 Cleda Mccreedy Tiarrah Saville

## 2017-03-10 NOTE — Transfer of Care (Signed)
Immediate Anesthesia Transfer of Care Note  Patient: Kyle [name]  Procedure(s) Performed: ECT  Patient Location: PACU  Anesthesia Type:General  Level of Consciousness: sedated  Airway & Oxygen Therapy: Patient Spontanous Breathing and Patient connected to face mask oxygen  Post-op Assessment: Report given to RN and Post -op Vital signs reviewed and stable  Post vital signs: Reviewed and stable  Last Vitals:  Vitals:   03/10/17 0941 03/10/17 1052  BP: (!) 123/58 (!) 201/79  Pulse: (!) 47 (!) 57  Resp: 18 20  Temp: 36.4 C 36.6 C    Last Pain:  Vitals:   03/10/17 1052  TempSrc:   PainSc: 0-No pain         Complications: No apparent anesthesia complications

## 2017-03-10 NOTE — Anesthesia Post-op Follow-up Note (Cosign Needed)
Anesthesia QCDR form completed.        

## 2017-03-10 NOTE — Anesthesia Preprocedure Evaluation (Signed)
Anesthesia Evaluation  Patient identified by MRN, date of birth, ID band Patient awake    Reviewed: Allergy & Precautions, NPO status , Patient's Chart, lab work & pertinent test results  History of Anesthesia Complications Negative for: history of anesthetic complications  Airway Mallampati: III       Dental  (+) Poor Dentition, Missing, Chipped   Pulmonary neg pulmonary ROS, neg sleep apnea, neg COPD,    breath sounds clear to auscultation- rhonchi (-) decreased breath sounds(-) wheezing      Cardiovascular Exercise Tolerance: Good hypertension, Pt. on medications and Pt. on home beta blockers (-) CAD and (-) Past MI  Rhythm:Regular Rate:Normal - Systolic murmurs and - Diastolic murmurs    Neuro/Psych PSYCHIATRIC DISORDERS Depression Bipolar Disorder  Neuromuscular disease    GI/Hepatic negative GI ROS, Neg liver ROS, neg GERD  ,  Endo/Other  diabetes, Type 2  Renal/GU      Musculoskeletal negative musculoskeletal ROS (+)   Abdominal (+) - obese,   Peds negative pediatric ROS (+)  Hematology negative hematology ROS (+)   Anesthesia Other Findings Past Medical History: No date: Bipolar 1 disorder (HCC) No date: BPH (benign prostatic hyperplasia) No date: Cerebrovascular disease No date: HTN No date: Hyperlipidemia No date: Neuropathy (HCC) No date: SVT (supraventricular tachycardia) (HCC) No date: Tremor, essential     Comment: only to the hands No date: Type 2 diabetes mellitus (HCC)    Reproductive/Obstetrics                            Anesthesia Physical  Anesthesia Plan  ASA: III  Anesthesia Plan: General   Post-op Pain Management:    Induction: Intravenous  Airway Management Planned: Mask and LMA  Additional Equipment:   Intra-op Plan:   Post-operative Plan:   Informed Consent: I have reviewed the patients History and Physical, chart, labs and discussed the  procedure including the risks, benefits and alternatives for the proposed anesthesia with the patient or authorized representative who has indicated his/her understanding and acceptance.     Plan Discussed with: CRNA and Anesthesiologist  Anesthesia Plan Comments: (Patient has required LMA in the past for this procedure.  Plan to place LMA if we have trouble mask ventilating the patient)        Anesthesia Quick Evaluation

## 2017-03-22 ENCOUNTER — Telehealth: Payer: Self-pay | Admitting: *Deleted

## 2017-03-23 ENCOUNTER — Other Ambulatory Visit: Payer: Self-pay | Admitting: Psychiatry

## 2017-03-24 ENCOUNTER — Encounter: Payer: Self-pay | Admitting: Anesthesiology

## 2017-03-24 ENCOUNTER — Encounter
Admission: RE | Admit: 2017-03-24 | Discharge: 2017-03-24 | Disposition: A | Discharge status: Home / Self Care | Payer: Medicare Other | Source: Ambulatory Visit | Attending: Psychiatry | Admitting: Psychiatry

## 2017-03-24 DIAGNOSIS — F339 Major depressive disorder, recurrent, unspecified: Secondary | ICD-10-CM | POA: Diagnosis not present

## 2017-03-24 DIAGNOSIS — F314 Bipolar disorder, current episode depressed, severe, without psychotic features: Secondary | ICD-10-CM | POA: Diagnosis not present

## 2017-03-24 DIAGNOSIS — E119 Type 2 diabetes mellitus without complications: Secondary | ICD-10-CM | POA: Insufficient documentation

## 2017-03-24 DIAGNOSIS — R569 Unspecified convulsions: Secondary | ICD-10-CM | POA: Insufficient documentation

## 2017-03-24 DIAGNOSIS — N400 Enlarged prostate without lower urinary tract symptoms: Secondary | ICD-10-CM | POA: Diagnosis not present

## 2017-03-24 DIAGNOSIS — E785 Hyperlipidemia, unspecified: Secondary | ICD-10-CM | POA: Diagnosis not present

## 2017-03-24 DIAGNOSIS — F319 Bipolar disorder, unspecified: Secondary | ICD-10-CM | POA: Diagnosis not present

## 2017-03-24 DIAGNOSIS — I679 Cerebrovascular disease, unspecified: Secondary | ICD-10-CM | POA: Insufficient documentation

## 2017-03-24 DIAGNOSIS — I471 Supraventricular tachycardia: Secondary | ICD-10-CM | POA: Insufficient documentation

## 2017-03-24 LAB — GLUCOSE, CAPILLARY: Glucose-Capillary: 99 mg/dL (ref 65–99)

## 2017-03-24 MED ORDER — SUCCINYLCHOLINE CHLORIDE 20 MG/ML IJ SOLN
INTRAMUSCULAR | Status: AC
  Filled 2017-03-24: qty 1

## 2017-03-24 MED ORDER — ONDANSETRON HCL 4 MG/2ML IJ SOLN: 4.0000 mg | Freq: Once | INTRAMUSCULAR | Status: DC | PRN

## 2017-03-24 MED ORDER — METHOHEXITAL SODIUM 100 MG/10ML IV SOSY
PREFILLED_SYRINGE | INTRAVENOUS | Status: DC | PRN
  Administered 2017-03-24: 80 mg via INTRAVENOUS

## 2017-03-24 MED ORDER — SODIUM CHLORIDE 0.9 % IV SOLN
INTRAVENOUS | Status: DC | PRN
  Administered 2017-03-24: 10:00:00 via INTRAVENOUS

## 2017-03-24 MED ORDER — FENTANYL CITRATE (PF) 100 MCG/2ML IJ SOLN: 25.0000 ug | INTRAMUSCULAR | Status: DC | PRN

## 2017-03-24 MED ORDER — SUCCINYLCHOLINE CHLORIDE 200 MG/10ML IV SOSY
PREFILLED_SYRINGE | INTRAVENOUS | Status: DC | PRN
  Administered 2017-03-24: 110 mg via INTRAVENOUS

## 2017-03-24 MED ORDER — SODIUM CHLORIDE 0.9 % IV SOLN
500.0000 mL | Freq: Once | INTRAVENOUS | Status: AC
  Administered 2017-03-24: 1000 mL via INTRAVENOUS

## 2017-03-24 NOTE — Anesthesia Postprocedure Evaluation (Signed)
Anesthesia Post Note  Patient: Kyle [name]  Procedure(s) Performed: * No procedures listed *  Patient location during evaluation: PACU Anesthesia Type: General Level of consciousness: awake and alert and oriented Pain management: pain level controlled Vital Signs Assessment: post-procedure vital signs reviewed and stable Respiratory status: spontaneous breathing Cardiovascular status: blood pressure returned to baseline Anesthetic complications: no     Last Vitals:  Vitals:   03/24/17 1200 03/24/17 1210  BP: (!) 168/79 113/89  Pulse: (!) 58 (!) 53  Resp: 16 (!) 26  Temp: 37.3 C     Last Pain:  Vitals:   03/24/17 0933  TempSrc: Oral                 Elsbeth Yearick
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
## 2017-03-24 NOTE — Discharge Instructions (Signed)
1)  The drugs that you have been given will stay in your system until tomorrow so for the       next 24 hours you should not:  A. Drive an automobile  B. Make any legal decisions  C. Drink any alcoholic beverages  2)  You may resume your regular meals upon return home.  3)  A responsible adult must take you home.  Someone should stay with you for a few          hours, then be available by phone for the remainder of the treatment day.  4)  You May experience any of the following symptoms:  Headache, Nausea and a dry mouth (due to the medications you were given),  temporary memory loss and some confusion, or sore muscles (a warm bath  should help this).  If you you experience any of these symptoms let us know on                your return visit.  5)  Report any of the following: any acute discomfort, severe headache, or temperature        greater than 100.5 F.   Also report any unusual redness, swelling, drainage, or pain         at your IV site.    You may report Symptoms to:  ECT PROGRAM- [place] [facility]          Phone: [phone], ECT Department           or Dr. Shary Key office [phone]  6)  Your next ECT Treatment is [facility]  Date MAY 23 AT 900AM  We will call 2 days prior to your scheduled appointment for arrival times.  7)  Nothing to eat or drink after midnight the night before your procedure.  8)  Take METOPROLOL     With a sip of water the morning of your procedure.  9)  Other Instructions: Call [phone] to cancel the morning of your procedure due         to illness or emergency.  10) We will call within 72 hours to assess how you are feeling.

## 2017-03-24 NOTE — Anesthesia Preprocedure Evaluation (Signed)
Anesthesia Evaluation  Patient identified by MRN, date of birth, ID band Patient awake    Reviewed: Allergy & Precautions, NPO status , Patient's Chart, lab work & pertinent test results, reviewed documented beta blocker date and time   History of Anesthesia Complications Negative for: history of anesthetic complications  Airway Mallampati: III       Dental  (+) Poor Dentition, Missing, Chipped   Pulmonary neg pulmonary ROS, neg sleep apnea, neg COPD,    breath sounds clear to auscultation- rhonchi (-) decreased breath sounds(-) wheezing      Cardiovascular Exercise Tolerance: Good hypertension, Pt. on medications and Pt. on home beta blockers (-) CAD and (-) Past MI  Rhythm:Regular Rate:Normal - Systolic murmurs and - Diastolic murmurs    Neuro/Psych PSYCHIATRIC DISORDERS Depression Bipolar Disorder  Neuromuscular disease    GI/Hepatic negative GI ROS, Neg liver ROS, neg GERD  ,  Endo/Other  diabetes, Type 2  Renal/GU   negative genitourinary   Musculoskeletal negative musculoskeletal ROS (+)   Abdominal (+) - obese,   Peds negative pediatric ROS (+)  Hematology negative hematology ROS (+)   Anesthesia Other Findings Past Medical History: No date: Bipolar 1 disorder (HCC) No date: BPH (benign prostatic hyperplasia) No date: Cerebrovascular disease No date: HTN No date: Hyperlipidemia No date: Neuropathy (HCC) No date: SVT (supraventricular tachycardia) (HCC) No date: Tremor, essential     Comment: only to the hands No date: Type 2 diabetes mellitus (HCC)    Reproductive/Obstetrics                             Anesthesia Physical  Anesthesia Plan  ASA: III  Anesthesia Plan: General   Post-op Pain Management:    Induction: Intravenous  Airway Management Planned: Mask and LMA  Additional Equipment:   Intra-op Plan:   Post-operative Plan:   Informed Consent: I have  reviewed the patients History and Physical, chart, labs and discussed the procedure including the risks, benefits and alternatives for the proposed anesthesia with the patient or authorized representative who has indicated his/her understanding and acceptance.     Plan Discussed with: CRNA and Anesthesiologist  Anesthesia Plan Comments: (Patient has required LMA in the past for this procedure.  Plan to place LMA if we have trouble mask ventilating the patient)        Anesthesia Quick Evaluation

## 2017-03-24 NOTE — Anesthesia Procedure Notes (Signed)
Procedure Name: LMA Insertion Date/Time: 03/24/2017 11:45 AM Performed by: Lily Kocher Pre-anesthesia Checklist: Patient identified, Patient being monitored, Timeout performed, Emergency Drugs available and Suction available Patient Re-evaluated:Patient Re-evaluated prior to inductionOxygen Delivery Method: Circle system utilized Preoxygenation: Pre-oxygenation with 100% oxygen Intubation Type: IV induction Ventilation: Mask ventilation with difficulty LMA: LMA inserted LMA Size: 4.0 Tube type: Oral Number of attempts: 1 Placement Confirmation: positive ETCO2 and breath sounds checked- equal and bilateral Tube secured with: Tape Dental Injury: Teeth and Oropharynx as per pre-operative assessment

## 2017-03-24 NOTE — Procedures (Signed)
ECT SERVICES Physician's Interval Evaluation & Treatment Note  Patient Identification: Kyle [name] MRN:  [id number] Date of Evaluation:  03/24/2017 TX #: 9  MADRS:   MMSE:   P.E. Findings:  Vitals unremarkable heart and lungs normal  Psychiatric Interval Note:  No new complaints. Mood reported as stable no evidence of return of suicidality or psychosis  Subjective:  Patient is a 71 y.o. male seen for evaluation for Electroconvulsive Therapy. No new complaints  Treatment Summary:     Right Unilateral              Bilateral   % Energy : 0.3 ms 100%   Impedance: 1150 ohms  Seizure Energy Index: 4244 V squared  Postictal Suppression Index: 77%  Seizure Concordance Index: 95%  Medications  Pre Shock: Brevital 80 mg, succinylcholine 100 mg  Post Shock:    Seizure Duration: 29 seconds by EMG, 68 seconds by EEG   Comments: I agree with his request that we stretch this out to every 3 weeks we will see him back on the 23rd. Reminded him that he needs to stay on his medicine and be following up outpatient with his regular psychiatric provider.   Lungs:    Clear to auscultation                Other:   Heart:      Regular rhythm              irregular rhythm      Previous H&P reviewed, patient examined and there are NO CHANGES                   Previous H&P reviewed, patient examined and there are changes noted.   [name], MD 5/2/201811:38 AM

## 2017-03-24 NOTE — Transfer of Care (Signed)
Immediate Anesthesia Transfer of Care Note  Patient: Kyle [name]  Procedure(s) Performed: ECT  Patient Location: PACU  Anesthesia Type:General  Level of Consciousness: sedated  Airway & Oxygen Therapy: Patient Spontanous Breathing  Post-op Assessment: Report given to RN and Post -op Vital signs reviewed and stable  Post vital signs: Reviewed and stable  Last Vitals:  Vitals:   03/24/17 0933  BP: 126/61  Pulse: (!) 46  Resp: 18  Temp: 36.7 C    Last Pain:  Vitals:   03/24/17 0933  TempSrc: Oral         Complications: No apparent anesthesia complications

## 2017-03-24 NOTE — Anesthesia Post-op Follow-up Note (Cosign Needed)
Anesthesia QCDR form completed.        

## 2017-04-06 ENCOUNTER — Other Ambulatory Visit: Payer: Self-pay | Admitting: Psychiatry

## 2017-04-12 ENCOUNTER — Other Ambulatory Visit: Payer: Self-pay | Admitting: Psychiatry

## 2017-04-12 ENCOUNTER — Telehealth: Payer: Self-pay | Admitting: *Deleted

## 2017-04-13 ENCOUNTER — Other Ambulatory Visit: Payer: Self-pay | Admitting: Psychiatry

## 2017-04-14 ENCOUNTER — Encounter: Payer: Self-pay | Admitting: Anesthesiology

## 2017-04-14 ENCOUNTER — Encounter (HOSPITAL_BASED_OUTPATIENT_CLINIC_OR_DEPARTMENT_OTHER)
Admission: RE | Admit: 2017-04-14 | Discharge: 2017-04-14 | Disposition: A | Discharge status: Home / Self Care | Payer: Medicare Other | Source: Ambulatory Visit | Attending: Psychiatry | Admitting: Psychiatry

## 2017-04-14 DIAGNOSIS — F3130 Bipolar disorder, current episode depressed, mild or moderate severity, unspecified: Secondary | ICD-10-CM | POA: Diagnosis not present

## 2017-04-14 DIAGNOSIS — R569 Unspecified convulsions: Secondary | ICD-10-CM | POA: Diagnosis not present

## 2017-04-14 MED ORDER — SODIUM CHLORIDE 0.9 % IV SOLN
INTRAVENOUS | Status: DC | PRN
  Administered 2017-04-14: 12:00:00 via INTRAVENOUS

## 2017-04-14 MED ORDER — FENTANYL CITRATE (PF) 100 MCG/2ML IJ SOLN: 25.0000 ug | INTRAMUSCULAR | Status: DC | PRN

## 2017-04-14 MED ORDER — SUCCINYLCHOLINE CHLORIDE 20 MG/ML IJ SOLN
INTRAMUSCULAR | Status: AC
  Filled 2017-04-14: qty 1

## 2017-04-14 MED ORDER — KETOROLAC TROMETHAMINE 30 MG/ML IJ SOLN
30.0000 mg | Freq: Once | INTRAMUSCULAR | Status: AC
  Administered 2017-04-14: 30 mg via INTRAVENOUS

## 2017-04-14 MED ORDER — METHOHEXITAL SODIUM 100 MG/10ML IV SOSY
PREFILLED_SYRINGE | INTRAVENOUS | Status: DC | PRN
  Administered 2017-04-14: 80 mg via INTRAVENOUS

## 2017-04-14 MED ORDER — ONDANSETRON HCL 4 MG/2ML IJ SOLN: 4.0000 mg | Freq: Once | INTRAMUSCULAR | Status: DC | PRN

## 2017-04-14 MED ORDER — SODIUM CHLORIDE 0.9 % IV SOLN
500.0000 mL | Freq: Once | INTRAVENOUS | Status: AC
  Administered 2017-04-14: 1000 mL via INTRAVENOUS

## 2017-04-14 MED ORDER — KETOROLAC TROMETHAMINE 30 MG/ML IJ SOLN
INTRAMUSCULAR | Status: AC
  Administered 2017-04-14: 30 mg via INTRAVENOUS
  Filled 2017-04-14: qty 1

## 2017-04-14 MED ORDER — SUCCINYLCHOLINE CHLORIDE 200 MG/10ML IV SOSY
PREFILLED_SYRINGE | INTRAVENOUS | Status: DC | PRN
  Administered 2017-04-14: 120 mg via INTRAVENOUS

## 2017-04-14 NOTE — Anesthesia Procedure Notes (Signed)
Date/Time: 04/14/2017 12:16 PM Performed by: Lily KocherPERALTA, Kyle Pottenger Pre-anesthesia Checklist: Patient identified, Emergency Drugs available, Suction available and Patient being monitored Patient Re-evaluated:Patient Re-evaluated prior to inductionOxygen Delivery Method: Circle system utilized Preoxygenation: Pre-oxygenation with 100% oxygen Intubation Type: IV induction Ventilation: Mask ventilation without difficulty and Mask ventilation throughout procedure Airway Equipment and Method: Bite block Placement Confirmation: positive ETCO2 Dental Injury: Teeth and Oropharynx as per pre-operative assessment
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
## 2017-04-14 NOTE — Anesthesia Preprocedure Evaluation (Signed)
Anesthesia Evaluation  Patient identified by MRN, date of birth, ID band Patient awake    Reviewed: Allergy & Precautions, NPO status , Patient's Chart, lab work & pertinent test results, reviewed documented beta blocker date and time   History of Anesthesia Complications Negative for: history of anesthetic complications  Airway Mallampati: III       Dental  (+) Poor Dentition, Missing, Chipped   Pulmonary neg pulmonary ROS, neg sleep apnea, neg COPD,    breath sounds clear to auscultation- rhonchi (-) decreased breath sounds(-) wheezing      Cardiovascular Exercise Tolerance: Good hypertension, Pt. on medications and Pt. on home beta blockers (-) CAD and (-) Past MI  Rhythm:Regular Rate:Normal - Systolic murmurs and - Diastolic murmurs    Neuro/Psych PSYCHIATRIC DISORDERS  Neuromuscular disease    GI/Hepatic negative GI ROS, Neg liver ROS, neg GERD  ,  Endo/Other  diabetes, Type 2  Renal/GU   negative genitourinary   Musculoskeletal negative musculoskeletal ROS (+)   Abdominal (+) - obese,   Peds negative pediatric ROS (+)  Hematology negative hematology ROS (+)   Anesthesia Other Findings Past Medical History: No date: Bipolar 1 disorder (HCC) No date: BPH (benign prostatic hyperplasia) No date: Cerebrovascular disease No date: HTN No date: Hyperlipidemia No date: Neuropathy (HCC) No date: SVT (supraventricular tachycardia) (HCC) No date: Tremor, essential     Comment: only to the hands No date: Type 2 diabetes mellitus (HCC)    Reproductive/Obstetrics                             Anesthesia Physical  Anesthesia Plan  ASA: III  Anesthesia Plan: General   Post-op Pain Management:    Induction: Intravenous  Airway Management Planned: Mask and LMA  Additional Equipment:   Intra-op Plan:   Post-operative Plan:   Informed Consent: I have reviewed the patients History  and Physical, chart, labs and discussed the procedure including the risks, benefits and alternatives for the proposed anesthesia with the patient or authorized representative who has indicated his/her understanding and acceptance.     Plan Discussed with: CRNA and Anesthesiologist  Anesthesia Plan Comments: (Patient has required LMA in the past for this procedure.  Plan to place LMA if we have trouble mask ventilating the patient)        Anesthesia Quick Evaluation

## 2017-04-14 NOTE — Procedures (Signed)
ECT SERVICES Physician's Interval Evaluation & Treatment Note  Patient Identification: Suezanne JacquetMarvin D Badalamenti MRN:  [id number] Date of Evaluation:  04/14/2017 TX #: 10  MADRS: 14  MMSE: 28  P.E. Findings:  No change to physical exam  Psychiatric Interval Note:  Mood stable affect euthymic  Subjective:  Patient is a 71 y.o. male seen for evaluation for Electroconvulsive Therapy. No new complaints  Treatment Summary:   [x]   Right Unilateral             []  Bilateral   % Energy : 0.3 ms 100%   Impedance: 860 ohms  Seizure Energy Index: 2275 V squared  Postictal Suppression Index: 42%  Seizure Concordance Index: 89%  Medications  Pre Shock: Brevital 80 mg succinylcholine 100 mg  Post Shock:    Seizure Duration: 22 seconds EMG 36 seconds EEG   Comments: Follow-up 3 weeks   Lungs:  [x]   Clear to auscultation               []  Other:   Heart:    [x]   Regular rhythm             []  irregular rhythm    [x]   Previous H&P reviewed, patient examined and there are NO CHANGES                 []   Previous H&P reviewed, patient examined and there are changes noted.   Mordecai RasmussenJohn Clapacs, MD 5/23/201812:05 PM

## 2017-04-14 NOTE — Anesthesia Post-op Follow-up Note (Cosign Needed)
Anesthesia QCDR form completed.        

## 2017-04-14 NOTE — Discharge Instructions (Addendum)
1)  The drugs that you have been given will stay in your system until tomorrow so for the       next 24 hours you should not:  A. Drive an automobile  B. Make any legal decisions  C. Drink any alcoholic beverages  2)  You may resume your regular meals upon return home.  3)  A responsible adult must take you home.  Someone should stay with you for a few          hours, then be available by phone for the remainder of the treatment day.  4)  You May experience any of the following symptoms:  Headache, Nausea and a dry mouth (due to the medications you were given),  temporary memory loss and some confusion, or sore muscles (a warm bath  should help this).  If you you experience any of these symptoms let us know on                your return visit.  5)  Report any of the following: any acute discomfort, severe headache, or temperature        greater than 100.5 F.   Also report any unusual redness, swelling, drainage, or pain         at your IV site.    You may report Symptoms to:  ECT PROGRAM- [place] [facility]          Phone: [phone], ECT Department           or Dr. Shary Keylapac's office [phone]  6)  Your next ECT Treatment is Wednesday June 13   We will call 2 days prior to your scheduled appointment for arrival times.  7)  Nothing to eat or drink after midnight the night before your procedure.  8)  Take      With a sip of water the morning of your procedure.  9)  Other Instructions: Call [phone] to cancel the morning of your procedure due         to illness or emergency.  10) We will call within 72 hours to assess how you are feeling.

## 2017-04-14 NOTE — Transfer of Care (Signed)
Immediate Anesthesia Transfer of Care Note  Patient: Kyle [name]  Procedure(s) Performed: ECT  Patient Location: PACU  Anesthesia Type:General  Level of Consciousness: sedated  Airway & Oxygen Therapy: Patient Spontanous Breathing and Patient connected to face mask oxygen  Post-op Assessment: Report given to RN and Post -op Vital signs reviewed and stable  Post vital signs: Reviewed and stable  Last Vitals:  Vitals:   04/14/17 0938 04/14/17 1228  BP: (!) 156/70 187/82  Pulse: (!) 50 (!) (P) 58  Resp: 18 (P) 20  Temp: 36.8 C (P) 37 C    Last Pain:  Vitals:   04/14/17 0938  TempSrc: Oral         Complications: No apparent anesthesia complications

## 2017-04-15 NOTE — Anesthesia Postprocedure Evaluation (Signed)
Anesthesia Post Note  Patient: Kyle SprayMarvin D [name]  Procedure(s) Performed: * No procedures listed *  Patient location during evaluation: PACU Anesthesia Type: General Level of consciousness: awake and alert Pain management: pain level controlled Vital Signs Assessment: post-procedure vital signs reviewed and stable Respiratory status: spontaneous breathing, nonlabored ventilation, respiratory function stable and patient connected to nasal cannula oxygen Cardiovascular status: blood pressure returned to baseline and stable Postop Assessment: no signs of nausea or vomiting Anesthetic complications: no     Last Vitals:  Vitals:   04/14/17 1247 04/14/17 1258  BP: 138/87 (!) 161/92  Pulse: (!) 53 (!) 50  Resp: 18 18  Temp: 37.1 C     Last Pain:  Vitals:   04/14/17 1258  TempSrc:   PainSc: 0-No pain                 Lenard SimmerAndrew Nichalos Brenton

## 2017-04-30 ENCOUNTER — Telehealth: Payer: Self-pay | Admitting: *Deleted

## 2017-05-03 ENCOUNTER — Encounter: Payer: Self-pay | Admitting: Anesthesiology

## 2017-05-03 ENCOUNTER — Encounter
Admission: RE | Admit: 2017-05-03 | Discharge: 2017-05-03 | Disposition: A | Discharge status: Home / Self Care | Payer: Medicare Other | Source: Ambulatory Visit | Attending: Psychiatry | Admitting: Psychiatry

## 2017-05-03 ENCOUNTER — Other Ambulatory Visit: Payer: Self-pay | Admitting: Psychiatry

## 2017-05-03 DIAGNOSIS — I679 Cerebrovascular disease, unspecified: Secondary | ICD-10-CM | POA: Diagnosis not present

## 2017-05-03 DIAGNOSIS — E785 Hyperlipidemia, unspecified: Secondary | ICD-10-CM | POA: Insufficient documentation

## 2017-05-03 DIAGNOSIS — N400 Enlarged prostate without lower urinary tract symptoms: Secondary | ICD-10-CM | POA: Diagnosis not present

## 2017-05-03 DIAGNOSIS — F339 Major depressive disorder, recurrent, unspecified: Secondary | ICD-10-CM | POA: Diagnosis present

## 2017-05-03 DIAGNOSIS — I471 Supraventricular tachycardia: Secondary | ICD-10-CM | POA: Diagnosis not present

## 2017-05-03 DIAGNOSIS — G250 Essential tremor: Secondary | ICD-10-CM | POA: Insufficient documentation

## 2017-05-03 DIAGNOSIS — F3130 Bipolar disorder, current episode depressed, mild or moderate severity, unspecified: Secondary | ICD-10-CM | POA: Diagnosis not present

## 2017-05-03 DIAGNOSIS — I10 Essential (primary) hypertension: Secondary | ICD-10-CM | POA: Insufficient documentation

## 2017-05-03 DIAGNOSIS — E119 Type 2 diabetes mellitus without complications: Secondary | ICD-10-CM | POA: Insufficient documentation

## 2017-05-03 MED ORDER — SODIUM CHLORIDE 0.9 % IV SOLN
500.0000 mL | Freq: Once | INTRAVENOUS | Status: AC
  Administered 2017-05-03: 500 mL via INTRAVENOUS

## 2017-05-03 MED ORDER — SUCCINYLCHOLINE CHLORIDE 20 MG/ML IJ SOLN
INTRAMUSCULAR | Status: DC | PRN
  Administered 2017-05-03: 120 mg via INTRAVENOUS

## 2017-05-03 MED ORDER — METHOHEXITAL SODIUM 100 MG/10ML IV SOSY
PREFILLED_SYRINGE | INTRAVENOUS | Status: DC | PRN
  Administered 2017-05-03: 80 mg via INTRAVENOUS

## 2017-05-03 MED ORDER — SODIUM CHLORIDE 0.9 % IV SOLN
INTRAVENOUS | Status: DC | PRN
  Administered 2017-05-03: 11:00:00 via INTRAVENOUS

## 2017-05-03 NOTE — Anesthesia Post-op Follow-up Note (Cosign Needed)
Anesthesia QCDR form completed.        

## 2017-05-03 NOTE — Discharge Instructions (Signed)
1)  The drugs that you have been given will stay in your system until tomorrow so for the       next 24 hours you should not:  A. Drive an automobile  B. Make any legal decisions  C. Drink any alcoholic beverages  2)  You may resume your regular meals upon return home.  3)  A responsible adult must take you home.  Someone should stay with you for a few          hours, then be available by phone for the remainder of the treatment day.  4)  You May experience any of the following symptoms:  Headache, Nausea and a dry mouth (due to the medications you were given),  temporary memory loss and some confusion, or sore muscles (a warm bath  should help this).  If you you experience any of these symptoms let us know on                your return visit.  5)  Report any of the following: any acute discomfort, severe headache, or temperature        greater than 100.5 F.   Also report any unusual redness, swelling, drainage, or pain         at your IV site.    You may report Symptoms to:  ECT PROGRAM- [place] [facility]          Phone: [phone], ECT Department           or Dr. Shary Keylapac's office [phone]  6)  Your next ECT Treatment is Day Friday  Date May 28, 2017  We will call 2 days prior to your scheduled appointment for arrival times.  7)  Nothing to eat or drink after midnight the night before your procedure.  8)  Take metoprolol  With a sip of water the morning of your procedure.  9)  Other Instructions: Call [phone] to cancel the morning of your procedure due         to illness or emergency.  10) We will call within 72 hours to assess how you are feeling.

## 2017-05-03 NOTE — Transfer of Care (Signed)
Immediate Anesthesia Transfer of Care Note  Patient: Kyle [name]  Procedure(s) Performed: * No procedures listed *  Patient Location: PACU  Anesthesia Type:General  Level of Consciousness: awake and patient cooperative  Airway & Oxygen Therapy: Patient Spontanous Breathing and Patient connected to face mask oxygen  Post-op Assessment: Report given to RN and Post -op Vital signs reviewed and stable  Post vital signs: Reviewed and stable  Last Vitals:  Vitals:   05/03/17 0856 05/03/17 1100  BP: 139/65 (!) 196/91  Pulse: 89 (!) 57  Resp: 18 18  Temp: 36.7 C 37.1 C    Last Pain:  Vitals:   05/03/17 1100  TempSrc: Temporal         Complications: No apparent anesthesia complications

## 2017-05-03 NOTE — Procedures (Signed)
ECT SERVICES Physician's Interval Evaluation & Treatment Note  Patient Identification: Kyle [name] MRN:  [id number] Date of Evaluation:  05/03/2017 TX #: 11  MADRS:   MMSE:   P.E. Findings:  No change to physical exam  Psychiatric Interval Note:  Mood is reported as stable  Subjective:  Patient is a 71 y.o. male seen for evaluation for Electroconvulsive Therapy. Continues to feel subjectively fine  Treatment Summary:   [x]   Right Unilateral             []  Bilateral   % Energy : 0.3 ms 100%   Impedance: 980 ohms  Seizure Energy Index: 3870 V squared  Postictal Suppression Index: 78%  Seizure Concordance Index:    Medications  Pre Shock: Brevital 80 mg succinylcholine 100 mg  Post Shock:    Seizure Duration: 33 seconds by EMG 56 seconds by EEG   Comments: Please add Robinul 0.2 mg for next treatment   Lungs:  [x]   Clear to auscultation               []  Other:   Heart:    [x]   Regular rhythm             []  irregular rhythm    [x]   Previous H&P reviewed, patient examined and there are NO CHANGES                 []   Previous H&P reviewed, patient examined and there are changes noted.   Kyle RasmussenJohn Jacub Waiters, MD 6/11/201810:46 AM

## 2017-05-03 NOTE — Anesthesia Postprocedure Evaluation (Signed)
Anesthesia Post Note  Patient: Kyle [name]  Procedure(s) Performed: * No procedures listed *  Patient location during evaluation: PACU Anesthesia Type: General Level of consciousness: awake and alert Pain management: pain level controlled Vital Signs Assessment: post-procedure vital signs reviewed and stable Respiratory status: spontaneous breathing, nonlabored ventilation and respiratory function stable Cardiovascular status: blood pressure returned to baseline and stable Postop Assessment: no signs of nausea or vomiting Anesthetic complications: no     Last Vitals:  Vitals:   05/03/17 1135 05/03/17 1136  BP: 138/72 (!) 148/80  Pulse: (!) 48 (!) 51  Resp: 19 20  Temp:      Last Pain:  Vitals:   05/03/17 1110  TempSrc:   PainSc: 0-No pain                 Carlous Olivares
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
## 2017-05-03 NOTE — Anesthesia Procedure Notes (Signed)
Date/Time: 05/03/2017 10:51 AM Performed by: Marlana SalvageJESSUP, Aimy Sweeting Pre-anesthesia Checklist: Patient identified, Emergency Drugs available, Patient being monitored, Suction available and Timeout performed Patient Re-evaluated:Patient Re-evaluated prior to inductionOxygen Delivery Method: Circle system utilized Preoxygenation: Pre-oxygenation with 100% oxygen Intubation Type: IV induction Ventilation: Two handed mask ventilation required, Oral airway inserted - appropriate to patient size and Mask ventilation throughout procedure Airway Equipment and Method: Bite block Placement Confirmation: positive ETCO2 Dental Injury: Teeth and Oropharynx as per pre-operative assessment

## 2017-05-03 NOTE — Anesthesia Preprocedure Evaluation (Signed)
Anesthesia Evaluation  Patient identified by MRN, date of birth, ID band Patient awake    Reviewed: Allergy & Precautions, NPO status , Patient's Chart, lab work & pertinent test results  History of Anesthesia Complications Negative for: history of anesthetic complications  Airway Mallampati: III       Dental  (+) Poor Dentition, Missing   Pulmonary neg pulmonary ROS, neg sleep apnea, neg COPD,    breath sounds clear to auscultation- rhonchi (-) decreased breath sounds(-) wheezing      Cardiovascular Exercise Tolerance: Good hypertension, Pt. on medications and Pt. on home beta blockers (-) CAD and (-) Past MI  Rhythm:Regular Rate:Normal - Systolic murmurs and - Diastolic murmurs    Neuro/Psych PSYCHIATRIC DISORDERS Depression Bipolar Disorder  Neuromuscular disease    GI/Hepatic negative GI ROS, Neg liver ROS,   Endo/Other  diabetes, Type 2  Renal/GU      Musculoskeletal negative musculoskeletal ROS (+)   Abdominal (+) - obese,   Peds negative pediatric ROS (+)  Hematology negative hematology ROS (+)   Anesthesia Other Findings Past Medical History: No date: Bipolar 1 disorder (HCC) No date: BPH (benign prostatic hyperplasia) No date: Cerebrovascular disease No date: HTN No date: Hyperlipidemia No date: Neuropathy (HCC) No date: SVT (supraventricular tachycardia) (HCC) No date: Tremor, essential     Comment: only to the hands No date: Type 2 diabetes mellitus (HCC)    Reproductive/Obstetrics                             Anesthesia Physical  Anesthesia Plan  ASA: III  Anesthesia Plan: General   Post-op Pain Management:    Induction: Intravenous  PONV Risk Score and Plan: 1 and Ondansetron  Airway Management Planned: Mask  Additional Equipment:   Intra-op Plan:   Post-operative Plan:   Informed Consent: I have reviewed the patients History and Physical, chart, labs  and discussed the procedure including the risks, benefits and alternatives for the proposed anesthesia with the patient or authorized representative who has indicated his/her understanding and acceptance.     Plan Discussed with: CRNA and Anesthesiologist  Anesthesia Plan Comments:         Anesthesia Quick Evaluation

## 2017-05-24 ENCOUNTER — Telehealth: Payer: Self-pay

## 2017-05-27 ENCOUNTER — Other Ambulatory Visit: Payer: Self-pay | Admitting: Psychiatry

## 2017-05-28 ENCOUNTER — Encounter
Admission: RE | Admit: 2017-05-28 | Discharge: 2017-05-28 | Disposition: A | Discharge status: Home / Self Care | Payer: Medicare Other | Source: Ambulatory Visit | Attending: Psychiatry | Admitting: Psychiatry

## 2017-05-28 ENCOUNTER — Encounter: Payer: Self-pay | Admitting: Anesthesiology

## 2017-05-28 ENCOUNTER — Other Ambulatory Visit: Payer: Self-pay | Admitting: Psychiatry

## 2017-05-28 ENCOUNTER — Telehealth: Payer: Self-pay

## 2017-05-28 DIAGNOSIS — E119 Type 2 diabetes mellitus without complications: Secondary | ICD-10-CM | POA: Diagnosis not present

## 2017-05-28 DIAGNOSIS — I679 Cerebrovascular disease, unspecified: Secondary | ICD-10-CM | POA: Insufficient documentation

## 2017-05-28 DIAGNOSIS — F332 Major depressive disorder, recurrent severe without psychotic features: Secondary | ICD-10-CM | POA: Diagnosis not present

## 2017-05-28 DIAGNOSIS — E785 Hyperlipidemia, unspecified: Secondary | ICD-10-CM | POA: Insufficient documentation

## 2017-05-28 DIAGNOSIS — Z9889 Other specified postprocedural states: Secondary | ICD-10-CM | POA: Diagnosis not present

## 2017-05-28 DIAGNOSIS — F319 Bipolar disorder, unspecified: Secondary | ICD-10-CM | POA: Diagnosis present

## 2017-05-28 DIAGNOSIS — G250 Essential tremor: Secondary | ICD-10-CM | POA: Diagnosis not present

## 2017-05-28 DIAGNOSIS — I471 Supraventricular tachycardia: Secondary | ICD-10-CM | POA: Insufficient documentation

## 2017-05-28 DIAGNOSIS — Z818 Family history of other mental and behavioral disorders: Secondary | ICD-10-CM | POA: Diagnosis not present

## 2017-05-28 DIAGNOSIS — N400 Enlarged prostate without lower urinary tract symptoms: Secondary | ICD-10-CM | POA: Diagnosis not present

## 2017-05-28 MED ORDER — SUCCINYLCHOLINE CHLORIDE 200 MG/10ML IV SOSY
PREFILLED_SYRINGE | INTRAVENOUS | Status: DC | PRN
Start: 2017-05-28 — End: 2017-05-28
  Administered 2017-05-28: 120 mg via INTRAVENOUS

## 2017-05-28 MED ORDER — QUETIAPINE FUMARATE 400 MG PO TABS: 400.0000 mg | ORAL_TABLET | Freq: Every day | ORAL | 1 refills | Status: DC

## 2017-05-28 MED ORDER — METHOHEXITAL SODIUM 100 MG/10ML IV SOSY
PREFILLED_SYRINGE | INTRAVENOUS | Status: DC | PRN
  Administered 2017-05-28: 80 mg via INTRAVENOUS

## 2017-05-28 MED ORDER — METOPROLOL TARTRATE 25 MG PO TABS: 25.0000 mg | ORAL_TABLET | Freq: Two times a day (BID) | ORAL | 0 refills | Status: DC

## 2017-05-28 MED ORDER — FLUOXETINE HCL 20 MG PO CAPS: 20.0000 mg | ORAL_CAPSULE | Freq: Every day | ORAL | 0 refills | Status: DC

## 2017-05-28 MED ORDER — SODIUM CHLORIDE 0.9 % IV SOLN
500.0000 mL | Freq: Once | INTRAVENOUS | Status: AC
  Administered 2017-05-28: 10:00:00 via INTRAVENOUS

## 2017-05-28 MED ORDER — SODIUM CHLORIDE 0.9 % IV SOLN
INTRAVENOUS | Status: DC | PRN
  Administered 2017-05-28: 11:00:00 via INTRAVENOUS

## 2017-05-28 MED ORDER — OLANZAPINE 15 MG PO TABS: 15.0000 mg | ORAL_TABLET | Freq: Every day | ORAL | 0 refills | Status: DC

## 2017-05-28 MED ORDER — PROPRANOLOL HCL ER 60 MG PO CP24: 60.0000 mg | ORAL_CAPSULE | Freq: Every day | ORAL | 0 refills | Status: DC

## 2017-05-28 MED ORDER — SUCCINYLCHOLINE CHLORIDE 20 MG/ML IJ SOLN
INTRAMUSCULAR | Status: AC
  Filled 2017-05-28: qty 1

## 2017-05-28 NOTE — Procedures (Signed)
ECT SERVICES Physician's Interval Evaluation & Treatment Note  Patient Identification: Kyle [name] MRN:  [id number] Date of Evaluation:  05/28/2017 TX #: 12  MADRS:   MMSE:   P.E. Findings:  No change to physical exam. Heart and lungs normal. Vitals unremarkable  Psychiatric Interval Note:  Mood stable no suicidal ideation no psychosis  Subjective:  Patient is a 71 y.o. male seen for evaluation for Electroconvulsive Therapy. Has had some colon problems which are gradually clearing up  Treatment Summary:   [x]   Right Unilateral             []  Bilateral   % Energy : 0.3 ms 100%   Impedance: 1150 ohms  Seizure Energy Index: No reading, but looked adequate  Postictal Suppression Index: Kind of a gradual taper but clearly did and. No reading was made.  Seizure Concordance Index: 91%  Medications  Pre Shock: Brevital 80 mg succinylcholine 100 mg  Post Shock:    Seizure Duration: 25 seconds by EMG 45 seconds by EEG   Comments: Follow-up in 4 weeks   Lungs:  [x]   Clear to auscultation               []  Other:   Heart:    [x]   Regular rhythm             []  irregular rhythm    [x]   Previous H&P reviewed, patient examined and there are NO CHANGES                 []   Previous H&P reviewed, patient examined and there are changes noted.   Kyle RasmussenJohn Xsavier Seeley, MD 7/6/201811:24 AM

## 2017-05-28 NOTE — Transfer of Care (Signed)
Immediate Anesthesia Transfer of Care Note  Patient: Kyle [name]  Procedure(s) Performed: ECT  Patient Location: PACU  Anesthesia Type:General  Level of Consciousness: sedated  Airway & Oxygen Therapy: Patient Spontanous Breathing and Patient connected to face mask oxygen  Post-op Assessment: Report given to RN and Post -op Vital signs reviewed and stable  Post vital signs: Reviewed and stable  Last Vitals:  Vitals:   05/28/17 0940 05/28/17 1146  BP: 118/62 (!) 156/78  Pulse: (!) 55 (!) 58  Resp: 16 (!) 26  Temp: (!) 36.1 C (!) 36.3 C    Last Pain:  Vitals:   05/28/17 0940  TempSrc: Oral         Complications: No apparent anesthesia complications

## 2017-05-28 NOTE — Anesthesia Preprocedure Evaluation (Signed)
Anesthesia Evaluation  Patient identified by MRN, date of birth, ID band Patient awake    Reviewed: Allergy & Precautions, NPO status , Patient's Chart, lab work & pertinent test results, reviewed documented beta blocker date and time   Airway Mallampati: III  TM Distance: >3 FB     Dental  (+) Chipped   Pulmonary           Cardiovascular hypertension, Pt. on medications and Pt. on home beta blockers      Neuro/Psych PSYCHIATRIC DISORDERS Depression Bipolar Disorder  Neuromuscular disease    GI/Hepatic   Endo/Other  diabetes, Type 2  Renal/GU      Musculoskeletal   Abdominal   Peds  Hematology   Anesthesia Other Findings Hx of SVT.  Reproductive/Obstetrics                             Anesthesia Physical Anesthesia Plan  ASA: III  Anesthesia Plan: General   Post-op Pain Management:    Induction: Intravenous  PONV Risk Score and Plan:   Airway Management Planned:   Additional Equipment:   Intra-op Plan:   Post-operative Plan:   Informed Consent: I have reviewed the patients History and Physical, chart, labs and discussed the procedure including the risks, benefits and alternatives for the proposed anesthesia with the patient or authorized representative who has indicated his/her understanding and acceptance.     Plan Discussed with: CRNA  Anesthesia Plan Comments:         Anesthesia Quick Evaluation

## 2017-05-28 NOTE — Anesthesia Post-op Follow-up Note (Cosign Needed)
Anesthesia QCDR form completed.        

## 2017-05-28 NOTE — Discharge Instructions (Signed)
1)  The drugs that you have been given will stay in your system until tomorrow so for the       next 24 hours you should not:  A. Drive an automobile  B. Make any legal decisions  C. Drink any alcoholic beverages  2)  You may resume your regular meals upon return home.  3)  A responsible adult must take you home.  Someone should stay with you for a few          hours, then be available by phone for the remainder of the treatment day.  4)  You May experience any of the following symptoms:  Headache, Nausea and a dry mouth (due to the medications you were given),  temporary memory loss and some confusion, or sore muscles (a warm bath  should help this).  If you you experience any of these symptoms let us know on                your return visit.  5)  Report any of the following: any acute discomfort, severe headache, or temperature        greater than 100.5 F.   Also report any unusual redness, swelling, drainage, or pain         at your IV site.    You may report Symptoms to:  ECT PROGRAM- [place] at [facility]          Phone: [phone], ECT Department           or Dr. Shary Keylapac's office [phone]  6)  Your next ECT Treatment is Wednesday June 23, 2017  We will call 2 days prior to your scheduled appointment for arrival times.  7)  Nothing to eat or drink after midnight the night before your procedure.  8)  Take     With a sip of water the morning of your procedure.  9)  Other Instructions: Call [phone] to cancel the morning of your procedure due         to illness or emergency.  10) We will call within 72 hours to assess how you are feeling.

## 2017-05-28 NOTE — Progress Notes (Signed)
Sat on room air 96 resp rate 27  Pt states he feels like is breathing fine

## 2017-05-28 NOTE — Anesthesia Postprocedure Evaluation (Signed)
Anesthesia Post Note  Patient: Kyle [name]  Procedure(s) Performed: * No procedures listed *  Patient location during evaluation: PACU Anesthesia Type: General Level of consciousness: awake and alert Pain management: pain level controlled Vital Signs Assessment: post-procedure vital signs reviewed and stable Respiratory status: spontaneous breathing, nonlabored ventilation, respiratory function stable and patient connected to nasal cannula oxygen Cardiovascular status: blood pressure returned to baseline and stable Postop Assessment: no signs of nausea or vomiting Anesthetic complications: no     Last Vitals:  Vitals:   05/28/17 1214 05/28/17 1244  BP: (!) 147/73 (!) 145/62  Pulse: (!) 54 (!) 54  Resp: (!) 28 16  Temp: (!) 36.4 C (!) 36.4 C    Last Pain:  Vitals:   05/28/17 1244  TempSrc: Oral                 [name]

## 2017-05-28 NOTE — Anesthesia Procedure Notes (Signed)
Date/Time: 05/28/2017 11:31 AM Performed by: Lily KocherPERALTA, Kyle Limb Pre-anesthesia Checklist: Patient identified, Emergency Drugs available, Suction available and Patient being monitored Patient Re-evaluated:Patient Re-evaluated prior to inductionOxygen Delivery Method: Circle system utilized Preoxygenation: Pre-oxygenation with 100% oxygen Intubation Type: IV induction Ventilation: Mask ventilation without difficulty and Mask ventilation throughout procedure Airway Equipment and Method: Bite block Placement Confirmation: positive ETCO2 Dental Injury: Teeth and Oropharynx as per pre-operative assessment
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
## 2017-06-23 ENCOUNTER — Encounter: Payer: Self-pay | Admitting: Anesthesiology

## 2017-06-23 ENCOUNTER — Encounter
Admission: RE | Admit: 2017-06-23 | Discharge: 2017-06-23 | Disposition: A | Discharge status: Home / Self Care | Payer: Medicare Other | Source: Ambulatory Visit | Attending: Psychiatry | Admitting: Psychiatry

## 2017-06-23 ENCOUNTER — Other Ambulatory Visit: Payer: Self-pay | Admitting: Psychiatry

## 2017-06-23 DIAGNOSIS — G250 Essential tremor: Secondary | ICD-10-CM | POA: Insufficient documentation

## 2017-06-23 DIAGNOSIS — E785 Hyperlipidemia, unspecified: Secondary | ICD-10-CM | POA: Diagnosis not present

## 2017-06-23 DIAGNOSIS — I471 Supraventricular tachycardia: Secondary | ICD-10-CM | POA: Diagnosis not present

## 2017-06-23 DIAGNOSIS — I679 Cerebrovascular disease, unspecified: Secondary | ICD-10-CM | POA: Insufficient documentation

## 2017-06-23 DIAGNOSIS — I10 Essential (primary) hypertension: Secondary | ICD-10-CM | POA: Insufficient documentation

## 2017-06-23 DIAGNOSIS — N400 Enlarged prostate without lower urinary tract symptoms: Secondary | ICD-10-CM | POA: Insufficient documentation

## 2017-06-23 DIAGNOSIS — F338 Other recurrent depressive disorders: Secondary | ICD-10-CM | POA: Insufficient documentation

## 2017-06-23 DIAGNOSIS — E119 Type 2 diabetes mellitus without complications: Secondary | ICD-10-CM | POA: Insufficient documentation

## 2017-06-23 DIAGNOSIS — F319 Bipolar disorder, unspecified: Secondary | ICD-10-CM

## 2017-06-23 MED ORDER — SUCCINYLCHOLINE CHLORIDE 20 MG/ML IJ SOLN
INTRAMUSCULAR | Status: DC | PRN
  Administered 2017-06-23: 120 mg via INTRAVENOUS

## 2017-06-23 MED ORDER — METHOHEXITAL SODIUM 100 MG/10ML IV SOSY
PREFILLED_SYRINGE | INTRAVENOUS | Status: DC | PRN
  Administered 2017-06-23: 80 mg via INTRAVENOUS

## 2017-06-23 MED ORDER — ONDANSETRON HCL 4 MG/2ML IJ SOLN
4.0000 mg | Freq: Once | INTRAMUSCULAR | Status: DC | PRN
Start: 2017-06-23 — End: 2017-06-24

## 2017-06-23 MED ORDER — SODIUM CHLORIDE 0.9 % IV SOLN
INTRAVENOUS | Status: DC | PRN
  Administered 2017-06-23: 12:00:00 via INTRAVENOUS

## 2017-06-23 MED ORDER — FENTANYL CITRATE (PF) 100 MCG/2ML IJ SOLN: 25.0000 ug | INTRAMUSCULAR | Status: DC | PRN

## 2017-06-23 MED ORDER — SODIUM CHLORIDE 0.9 % IV SOLN
500.0000 mL | Freq: Once | INTRAVENOUS | Status: AC
  Administered 2017-06-23: 1000 mL via INTRAVENOUS

## 2017-06-23 NOTE — Discharge Instructions (Signed)
1)  The drugs that you have been given will stay in your system until tomorrow so for the       next 24 hours you should not:  A. Drive an automobile  B. Make any legal decisions  C. Drink any alcoholic beverages  2)  You may resume your regular meals upon return home.  3)  A responsible adult must take you home.  Someone should stay with you for a few          hours, then be available by phone for the remainder of the treatment day.  4)  You May experience any of the following symptoms:  Headache, Nausea and a dry mouth (due to the medications you were given),  temporary memory loss and some confusion, or sore muscles (a warm bath  should help this).  If you you experience any of these symptoms let us know on                your return visit.  5)  Report any of the following: any acute discomfort, severe headache, or temperature        greater than 100.5 F.   Also report any unusual redness, swelling, drainage, or pain         at your IV site.    You may report Symptoms to:  ECT [facility]- [place] [facility]          Phone: [phone], ECT Department           or Dr. Shary Keylapac's office [phone]  6)  Your next ECT Treatment is Day Wednesday  Date July 21, 2017  We will call 2 days prior to your scheduled appointment for arrival times.  7)  Nothing to eat or drink after midnight the night before your procedure.  8)  Take metoprolol    With a sip of water the morning of your procedure.  9)  Other Instructions: Call [phone] to cancel the morning of your procedure due         to illness or emergency.  10) We will call within 72 hours to assess how you are feeling.

## 2017-06-23 NOTE — Procedures (Signed)
ECT SERVICES Physician's Interval Evaluation & Treatment Note  Patient Identification: Kyle [name] MRN:  [id number] Date of Evaluation:  06/23/2017 TX #: 13  MADRS:   MMSE:   P.E. Findings:  No change to physical exam. Heart and lungs normal. Vitals normal.  Psychiatric Interval Note:  Mood good. No return of major depression and anxiety or mania and no suicidal thoughts  Subjective:  Patient is a 71 y.o. male seen for evaluation for Electroconvulsive Therapy. Good functioning no specific complaint  Treatment Summary:   [x]   Right Unilateral             []  Bilateral   % Energy : 0.3 ms 100%   Impedance: 990 ohms  Seizure Energy Index: 3751 V squared  Postictal Suppression Index: 28%  Seizure Concordance Index: 97%  Medications  Pre Shock: Brevital 80 mg succinylcholine 100 mg  Post Shock:    Seizure Duration: 24 seconds by EMG 45 seconds by my reading of the EEG   Comments: Doing well. Follow-up 4 weeks   Lungs:  [x]   Clear to auscultation               []  Other:   Heart:    [x]   Regular rhythm             []  irregular rhythm    [x]   Previous H&P reviewed, patient examined and there are NO CHANGES                 []   Previous H&P reviewed, patient examined and there are changes noted.   Kyle RasmussenJohn Clapacs, MD 8/1/201811:36 AM
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
## 2017-06-23 NOTE — Anesthesia Postprocedure Evaluation (Signed)
Anesthesia Post Note  Patient: Kyle [name]  Procedure(s) Performed: * No procedures listed *  Patient location during evaluation: PACU Anesthesia Type: General Level of consciousness: awake and alert Pain management: pain level controlled Vital Signs Assessment: post-procedure vital signs reviewed and stable Respiratory status: spontaneous breathing, nonlabored ventilation, respiratory function stable and patient connected to nasal cannula oxygen Cardiovascular status: blood pressure returned to baseline and stable Postop Assessment: no signs of nausea or vomiting Anesthetic complications: no     Last Vitals:  Vitals:   06/23/17 1224 06/23/17 1230  BP: (!) 146/78 (!) 146/78  Pulse: (!) 44 (!) 44  Resp: 20 18  Temp:  36.6 C    Last Pain:  Vitals:   06/23/17 1230  TempSrc: Oral                 Bertin Inabinet S

## 2017-06-23 NOTE — Anesthesia Post-op Follow-up Note (Cosign Needed)
Anesthesia QCDR form completed.        

## 2017-06-23 NOTE — Transfer of Care (Signed)
Immediate Anesthesia Transfer of Care Note  Patient: Kyle [name]  Procedure(s) Performed: ect  Patient Location: PACU  Anesthesia Type:General  Level of Consciousness: awake and alert   Airway & Oxygen Therapy: Patient Spontanous Breathing and Patient connected to face mask oxygen  Post-op Assessment: Report given to RN and Post -op Vital signs reviewed and stable  Post vital signs: Reviewed  Last Vitals:  Vitals:   06/23/17 0911 06/23/17 1156  BP:  (!) 144/99  Pulse: (!) 53 (!) 55  Resp: 18 20  Temp: 36.8 C 36.6 C    Last Pain:  Vitals:   06/23/17 0911  TempSrc: Oral         Complications: No apparent anesthesia complications

## 2017-07-06 ENCOUNTER — Other Ambulatory Visit: Payer: Self-pay | Admitting: Psychiatry

## 2017-07-19 ENCOUNTER — Telehealth: Payer: Self-pay

## 2017-07-20 ENCOUNTER — Other Ambulatory Visit: Payer: Self-pay | Admitting: Psychiatry

## 2017-07-21 ENCOUNTER — Encounter (HOSPITAL_BASED_OUTPATIENT_CLINIC_OR_DEPARTMENT_OTHER)
Admission: RE | Admit: 2017-07-21 | Discharge: 2017-07-21 | Disposition: A | Discharge status: Home / Self Care | Payer: Medicare Other | Source: Ambulatory Visit | Attending: Psychiatry | Admitting: Psychiatry

## 2017-07-21 ENCOUNTER — Encounter: Payer: Self-pay | Admitting: Anesthesiology

## 2017-07-21 DIAGNOSIS — F338 Other recurrent depressive disorders: Secondary | ICD-10-CM | POA: Diagnosis not present

## 2017-07-21 DIAGNOSIS — F3130 Bipolar disorder, current episode depressed, mild or moderate severity, unspecified: Secondary | ICD-10-CM

## 2017-07-21 MED ORDER — METHOHEXITAL SODIUM 100 MG/10ML IV SOSY
PREFILLED_SYRINGE | INTRAVENOUS | Status: DC | PRN
  Administered 2017-07-21: 80 mg via INTRAVENOUS

## 2017-07-21 MED ORDER — ONDANSETRON HCL 4 MG/2ML IJ SOLN: 4.0000 mg | Freq: Once | INTRAMUSCULAR | Status: DC | PRN

## 2017-07-21 MED ORDER — SUCCINYLCHOLINE CHLORIDE 20 MG/ML IJ SOLN
INTRAMUSCULAR | Status: AC
  Filled 2017-07-21: qty 1

## 2017-07-21 MED ORDER — FENTANYL CITRATE (PF) 100 MCG/2ML IJ SOLN: 25.0000 ug | INTRAMUSCULAR | Status: DC | PRN

## 2017-07-21 MED ORDER — METHOHEXITAL SODIUM 0.5 G IJ SOLR
INTRAMUSCULAR | Status: AC
  Filled 2017-07-21: qty 500

## 2017-07-21 MED ORDER — SODIUM CHLORIDE 0.9 % IV SOLN
INTRAVENOUS | Status: DC | PRN
  Administered 2017-07-21: 09:00:00 via INTRAVENOUS

## 2017-07-21 MED ORDER — SODIUM CHLORIDE 0.9 % IV SOLN
500.0000 mL | Freq: Once | INTRAVENOUS | Status: AC
Start: 2017-07-21 — End: 2017-07-21
  Administered 2017-07-21: 1000 mL via INTRAVENOUS

## 2017-07-21 MED ORDER — SUCCINYLCHOLINE CHLORIDE 200 MG/10ML IV SOSY
PREFILLED_SYRINGE | INTRAVENOUS | Status: DC | PRN
  Administered 2017-07-21: 120 mg via INTRAVENOUS

## 2017-07-21 NOTE — Anesthesia Postprocedure Evaluation (Signed)
Anesthesia Post Note  Patient: Kyle [name]  Procedure(s) Performed: * No procedures listed *  Patient location during evaluation: PACU Anesthesia Type: General Level of consciousness: awake and alert Pain management: pain level controlled Vital Signs Assessment: post-procedure vital signs reviewed and stable Respiratory status: spontaneous breathing, nonlabored ventilation, respiratory function stable and patient connected to nasal cannula oxygen Cardiovascular status: blood pressure returned to baseline and stable Postop Assessment: no signs of nausea or vomiting Anesthetic complications: no     Last Vitals:  Vitals:   07/21/17 1128 07/21/17 1135  BP: (!) 121/47 126/76  Pulse: (!) 56 (!) 55  Resp: 16 18  Temp: (!) 36.2 C   SpO2: 94%     Last Pain:  Vitals:   07/21/17 0904  TempSrc: Oral                 [name] S

## 2017-07-21 NOTE — Transfer of Care (Signed)
Immediate Anesthesia Transfer of Care Note  Patient: Kyle [name]  Procedure(s) Performed: ECT  Patient Location: PACU  Anesthesia Type:General  Level of Consciousness: sedated  Airway & Oxygen Therapy: Patient Spontanous Breathing and Patient connected to face mask oxygen  Post-op Assessment: Report given to RN and Post -op Vital signs reviewed and stable  Post vital signs: Reviewed and stable  Last Vitals:  Vitals:   07/21/17 0904 07/21/17 1100  BP: 138/67 (!) 154/97  Pulse: (!) 46 62  Resp: 18 18  Temp: 36.6 C 36.6 C  SpO2: 99% 100%    Last Pain:  Vitals:   07/21/17 0904  TempSrc: Oral         Complications: No apparent anesthesia complications
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
## 2017-07-21 NOTE — Anesthesia Post-op Follow-up Note (Signed)
Anesthesia QCDR form completed.        
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
## 2017-07-21 NOTE — Discharge Instructions (Signed)
1)  The drugs that you have been given will stay in your system until tomorrow so for the       next 24 hours you should not:  A. Drive an automobile  B. Make any legal decisions  C. Drink any alcoholic beverages  2)  You may resume your regular meals upon return home.  3)  A responsible adult must take you home.  Someone should stay with you for a few          hours, then be available by phone for the remainder of the treatment day.  4)  You May experience any of the following symptoms:  Headache, Nausea and a dry mouth (due to the medications you were given),  temporary memory loss and some confusion, or sore muscles (a warm bath  should help this).  If you you experience any of these symptoms let us know on                your return visit.  5)  Report any of the following: any acute discomfort, severe headache, or temperature        greater than 100.5 F.   Also report any unusual redness, swelling, drainage, or pain         at your IV site.    You may report Symptoms to:  ECT PROGRAM- [place] [facility]          Phone: [phone], ECT Department           or Dr. Shary Keylapac's office [phone]  6)  Your next ECT Treatment is Day Wednesday Date August 19, 2015  We will call 2 days prior to your scheduled appointment for arrival times.  7)  Nothing to eat or drink after midnight the night before your procedure.  8)  Take .   With a sip of water the morning of your procedure.  9)  Other Instructions: Call [phone] to cancel the morning of your procedure due         to illness or emergency.  10) We will call within 72 hours to assess how you are feeling.

## 2017-07-21 NOTE — Procedures (Signed)
ECT SERVICES Physician's Interval Evaluation & Treatment Note  Patient Identification: Kyle [name] MRN:  [id number] Date of Evaluation:  07/21/2017 TX #: 14  MADRS:   MMSE:   P.E. Findings:  No change to physical exam. Vitals unremarkable heart and lungs normal  Psychiatric Interval Note:  Mood is reported as good denies any suicidal thoughts or psychosis  Subjective:  Patient is a 71 y.o. male seen for evaluation for Electroconvulsive Therapy. No new complaint  Treatment Summary:   [x]   Right Unilateral             []  Bilateral   % Energy : 0.3 ms 100%   Impedance: 810 ohms  Seizure Energy Index: No reading but it looks like a very good seizure  Postictal Suppression Index: Also for some reason no reading although honestly I thought that there was very good suppression and a clear seizure  Seizure Concordance Index: 97%  Medications  Pre Shock: Brevital 80 mg succinylcholine 100 mg  Post Shock:    Seizure Duration: 29 seconds by EMG 59 seconds by EEG   Comments: Follow-up in 4 weeks   Lungs:  [x]   Clear to auscultation               []  Other:   Heart:    [x]   Regular rhythm             []  irregular rhythm    [x]   Previous H&P reviewed, patient examined and there are NO CHANGES                 []   Previous H&P reviewed, patient examined and there are changes noted.   Kyle RasmussenJohn Daysean Tinkham, MD 8/29/201810:42 AM

## 2017-07-21 NOTE — Anesthesia Procedure Notes (Signed)
Date/Time: 07/21/2017 10:49 AM Performed by: Lily KocherPERALTA, Lamine Laton Pre-anesthesia Checklist: Patient identified, Emergency Drugs available, Suction available and Patient being monitored Patient Re-evaluated:Patient Re-evaluated prior to induction Oxygen Delivery Method: Circle system utilized Preoxygenation: Pre-oxygenation with 100% oxygen Induction Type: IV induction Ventilation: Mask ventilation throughout procedure and Oral airway inserted - appropriate to patient size Airway Equipment and Method: Bite block Placement Confirmation: positive ETCO2 Dental Injury: Teeth and Oropharynx as per pre-operative assessment

## 2017-08-10 IMAGING — CT CT HEAD W/O CM
1 series · 16 of 30 positions shown, 20 images · non-contrast
Comparison: CT brain 09/27/2010

CLINICAL DATA: Altered mental status. Increased weakness and
worsening Parkinson's symptoms. Depressed.

EXAM:
CT HEAD WITHOUT CONTRAST
TECHNIQUE: Contiguous axial images were obtained from the base of the skull
through the vertex without intravenous contrast.

[Series 2: soft tissue · axial · 0.42mm/px · z∈[+1096,+1236]mm · 16 of 32 slices shown, 20 images]
[im 2/32  brain]
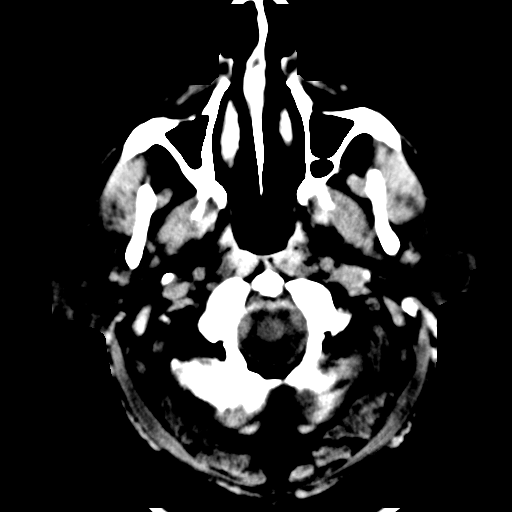
[im 2/32  bone]
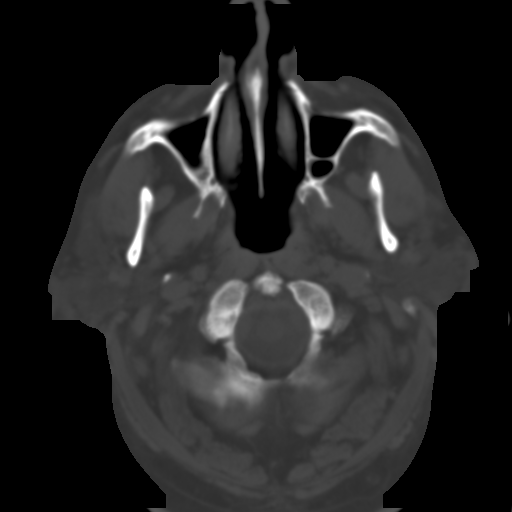
[im 4/32  brain]
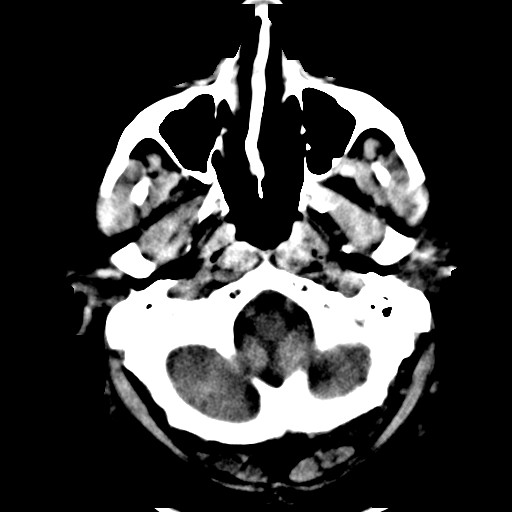
[im 6/32  brain]
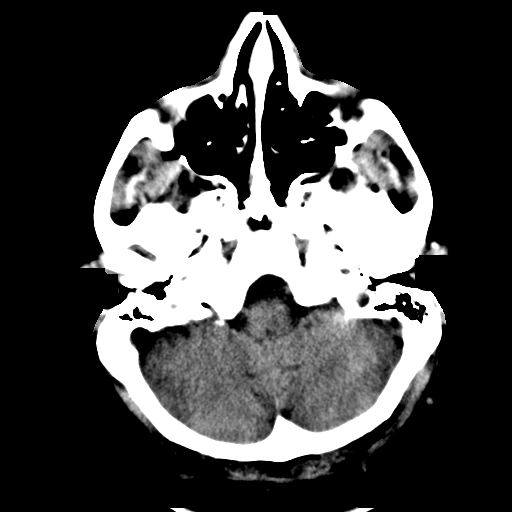
[im 8/32  brain]
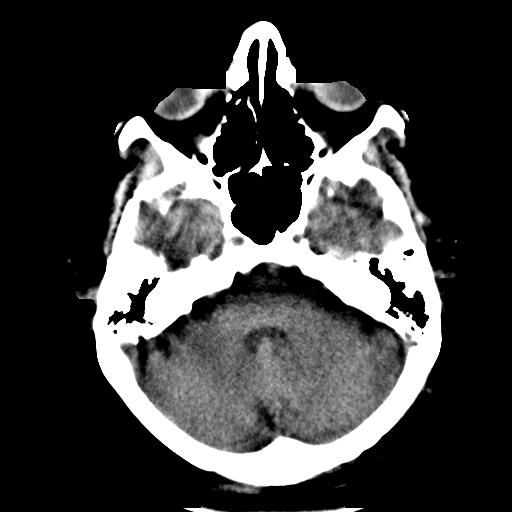
[im 9/32  brain]
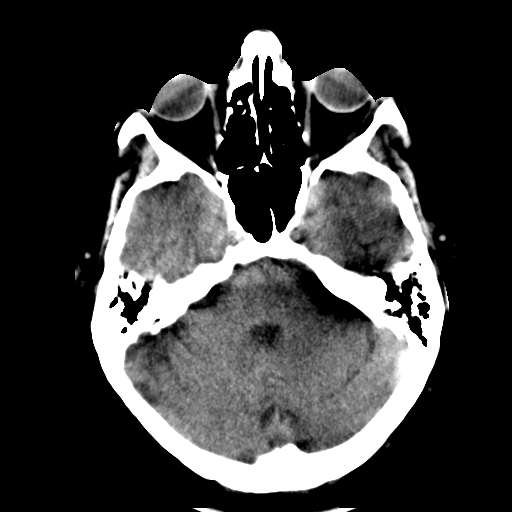
[im 9/32  bone]
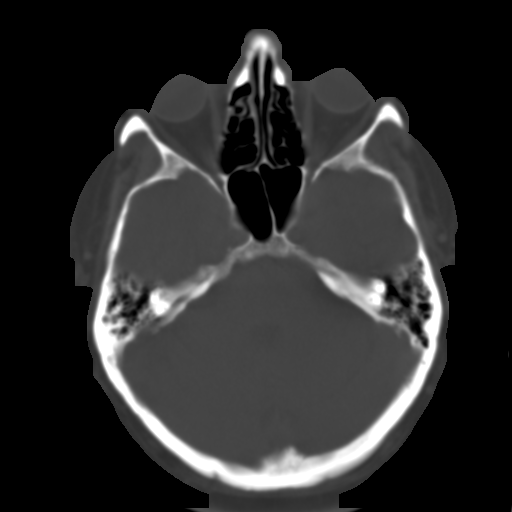
[im 11/32  brain]
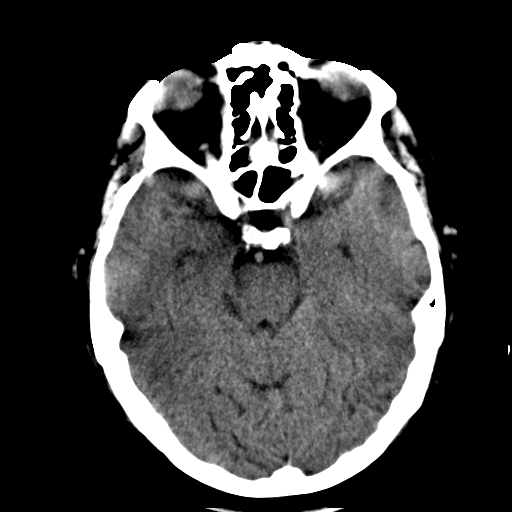
[im 13/32  brain]
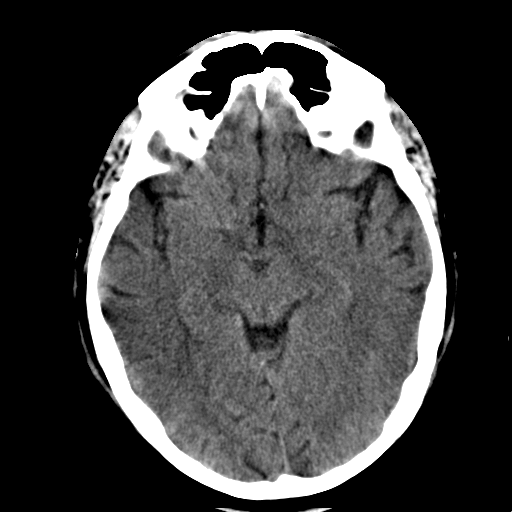
[im 15/32  brain]
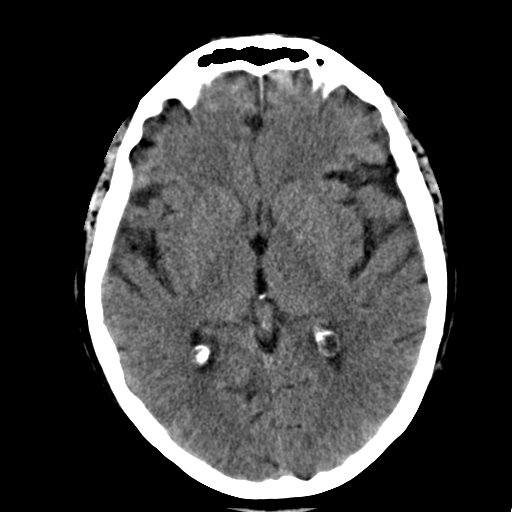
[im 17/32  brain]
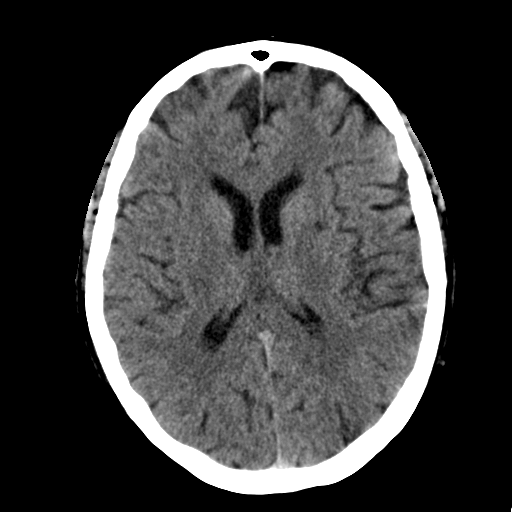
[im 17/32  bone]
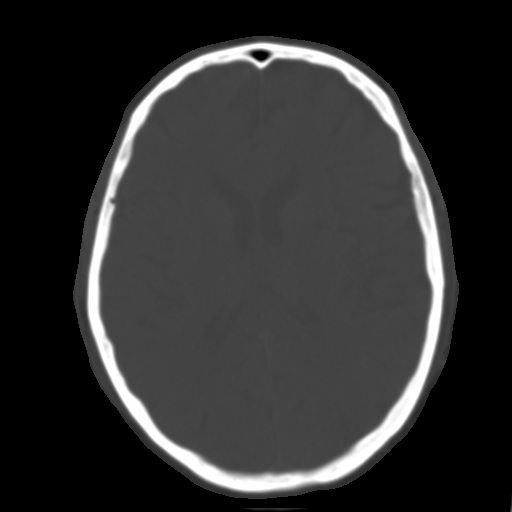
[im 19/32  brain]
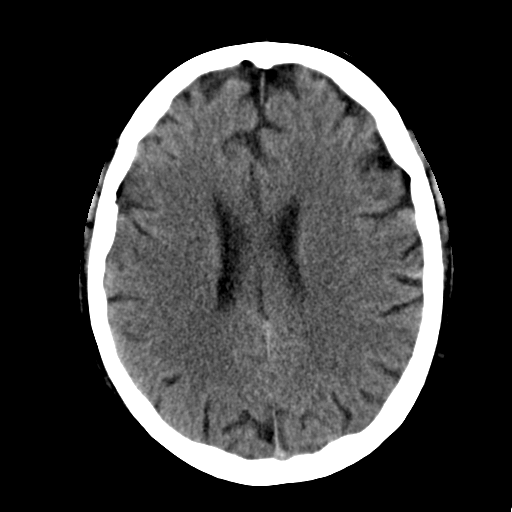
[im 21/32  brain]
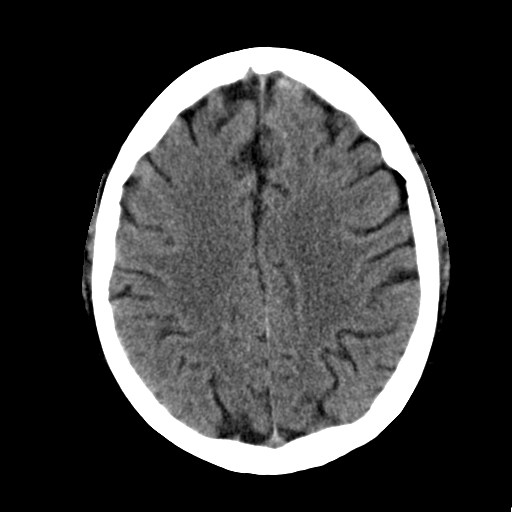
[im 23/32  brain]
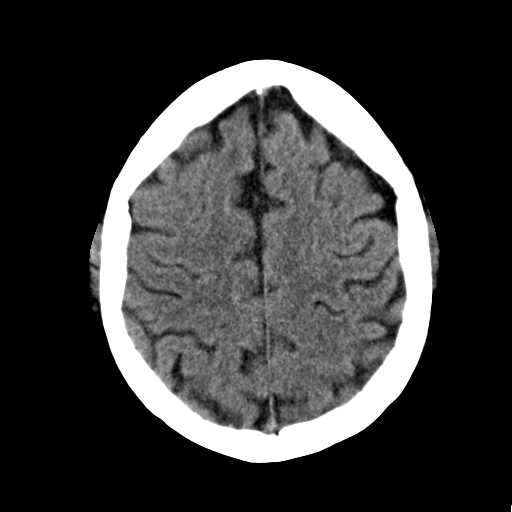
[im 24/32  brain]
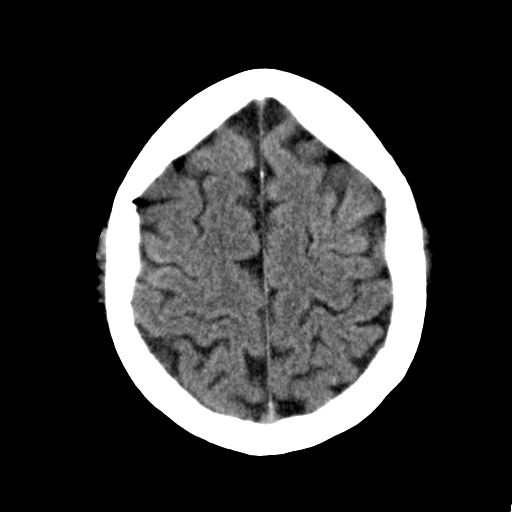
[im 24/32  bone]
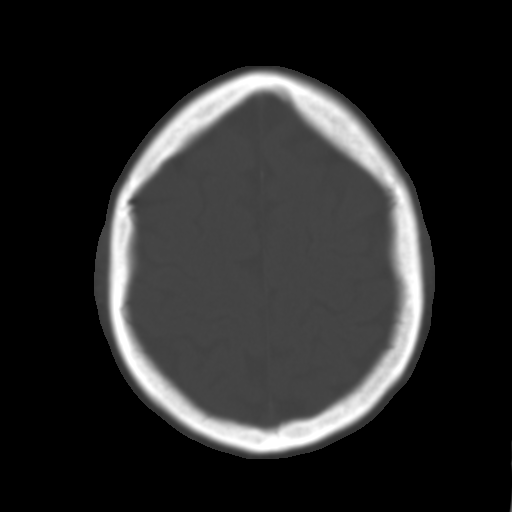
[im 26/32  brain]
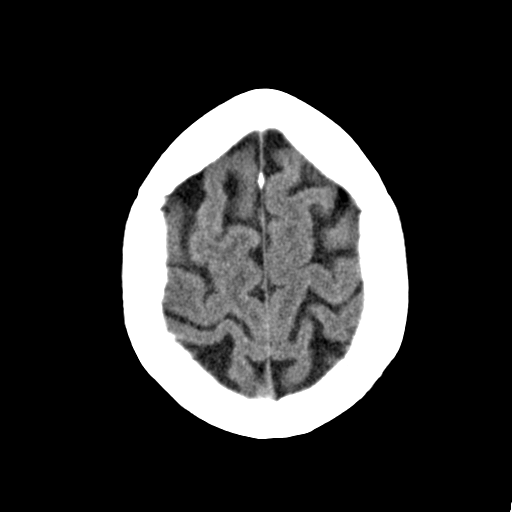
[im 28/32  brain]
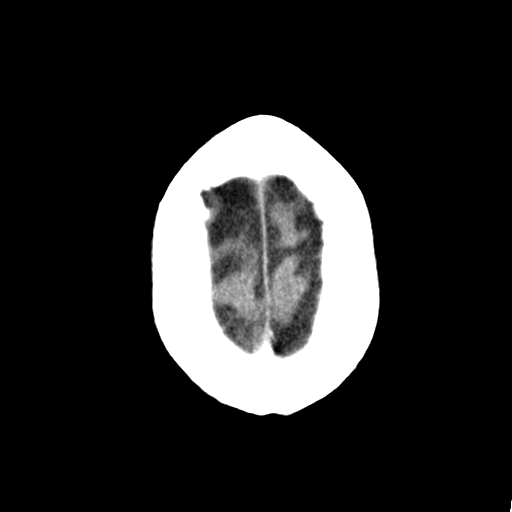
[im 30/32  brain]
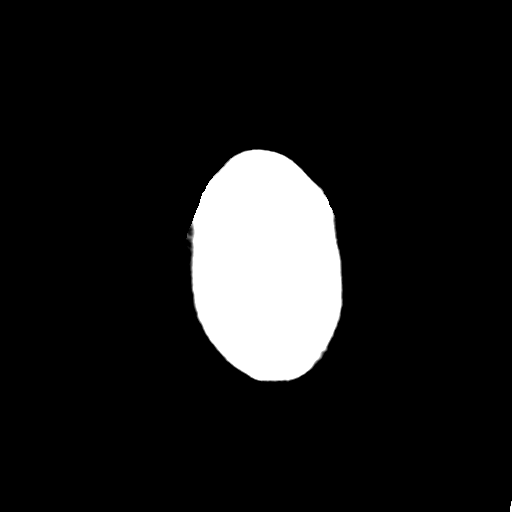

[16 of 30 positions shown; findings below may reference images not displayed]

FINDINGS: Age-indeterminate but likely chronic left basal ganglia lacunar
infarct. Old right cerebellar infarct. No evidence for acute
cortically based infarct, intracranial hemorrhage, mass lesion or
mass-effect. Ventricles and sulci are appropriate for patient's age.
Orbits are unremarkable. Paranasal sinuses are unremarkable. Mastoid
air cells are well aerated. Calvarium is intact.
IMPRESSION: Age-indeterminate but likely chronic left basal ganglia lacunar
infarct.

Otherwise no acute intracranial process.

## 2017-08-16 ENCOUNTER — Telehealth: Payer: Self-pay | Admitting: *Deleted

## 2017-08-17 ENCOUNTER — Other Ambulatory Visit: Payer: Self-pay | Admitting: Psychiatry

## 2017-08-18 ENCOUNTER — Telehealth: Payer: Self-pay | Admitting: *Deleted

## 2018-03-16 ENCOUNTER — Emergency Department: Payer: Medicare Other

## 2018-03-16 ENCOUNTER — Emergency Department
Admission: EM | Admit: 2018-03-16 | Discharge: 2018-03-16 | Disposition: A | Discharge status: Other Acute Inpatient Hospital | Payer: Medicare Other | Attending: Emergency Medicine | Admitting: Emergency Medicine

## 2018-03-16 ENCOUNTER — Inpatient Hospital Stay
Admission: AD | Admit: 2018-03-16 | Discharge: 2018-03-25 | DRG: 885 | Disposition: A | Discharge status: Home / Self Care | Payer: Medicare Other | Attending: Psychiatry | Admitting: Psychiatry

## 2018-03-16 ENCOUNTER — Other Ambulatory Visit: Payer: Self-pay

## 2018-03-16 ENCOUNTER — Encounter: Payer: Self-pay | Admitting: Emergency Medicine

## 2018-03-16 DIAGNOSIS — Z7982 Long term (current) use of aspirin: Secondary | ICD-10-CM | POA: Diagnosis not present

## 2018-03-16 DIAGNOSIS — G250 Essential tremor: Secondary | ICD-10-CM | POA: Diagnosis present

## 2018-03-16 DIAGNOSIS — E119 Type 2 diabetes mellitus without complications: Secondary | ICD-10-CM | POA: Insufficient documentation

## 2018-03-16 DIAGNOSIS — Z9114 Patient's other noncompliance with medication regimen: Secondary | ICD-10-CM | POA: Diagnosis not present

## 2018-03-16 DIAGNOSIS — G9009 Other idiopathic peripheral autonomic neuropathy: Secondary | ICD-10-CM | POA: Insufficient documentation

## 2018-03-16 DIAGNOSIS — R21 Rash and other nonspecific skin eruption: Secondary | ICD-10-CM | POA: Diagnosis not present

## 2018-03-16 DIAGNOSIS — Z79899 Other long term (current) drug therapy: Secondary | ICD-10-CM | POA: Insufficient documentation

## 2018-03-16 DIAGNOSIS — E785 Hyperlipidemia, unspecified: Secondary | ICD-10-CM | POA: Diagnosis present

## 2018-03-16 DIAGNOSIS — Z915 Personal history of self-harm: Secondary | ICD-10-CM | POA: Diagnosis not present

## 2018-03-16 DIAGNOSIS — F312 Bipolar disorder, current episode manic severe with psychotic features: Secondary | ICD-10-CM | POA: Diagnosis present

## 2018-03-16 DIAGNOSIS — F29 Unspecified psychosis not due to a substance or known physiological condition: Secondary | ICD-10-CM

## 2018-03-16 DIAGNOSIS — N400 Enlarged prostate without lower urinary tract symptoms: Secondary | ICD-10-CM | POA: Diagnosis present

## 2018-03-16 DIAGNOSIS — Z86711 Personal history of pulmonary embolism: Secondary | ICD-10-CM | POA: Diagnosis present

## 2018-03-16 DIAGNOSIS — F3112 Bipolar disorder, current episode manic without psychotic features, moderate: Secondary | ICD-10-CM | POA: Diagnosis not present

## 2018-03-16 DIAGNOSIS — F419 Anxiety disorder, unspecified: Secondary | ICD-10-CM | POA: Diagnosis present

## 2018-03-16 DIAGNOSIS — F3110 Bipolar disorder, current episode manic without psychotic features, unspecified: Secondary | ICD-10-CM | POA: Diagnosis not present

## 2018-03-16 DIAGNOSIS — I679 Cerebrovascular disease, unspecified: Secondary | ICD-10-CM | POA: Diagnosis present

## 2018-03-16 DIAGNOSIS — G4700 Insomnia, unspecified: Secondary | ICD-10-CM | POA: Diagnosis present

## 2018-03-16 DIAGNOSIS — Z86718 Personal history of other venous thrombosis and embolism: Secondary | ICD-10-CM

## 2018-03-16 DIAGNOSIS — I10 Essential (primary) hypertension: Secondary | ICD-10-CM | POA: Diagnosis present

## 2018-03-16 DIAGNOSIS — Z7901 Long term (current) use of anticoagulants: Secondary | ICD-10-CM | POA: Diagnosis not present

## 2018-03-16 DIAGNOSIS — R45851 Suicidal ideations: Secondary | ICD-10-CM

## 2018-03-16 DIAGNOSIS — Z8673 Personal history of transient ischemic attack (TIA), and cerebral infarction without residual deficits: Secondary | ICD-10-CM

## 2018-03-16 DIAGNOSIS — G629 Polyneuropathy, unspecified: Secondary | ICD-10-CM | POA: Diagnosis present

## 2018-03-16 DIAGNOSIS — G2581 Restless legs syndrome: Secondary | ICD-10-CM | POA: Diagnosis present

## 2018-03-16 DIAGNOSIS — Z818 Family history of other mental and behavioral disorders: Secondary | ICD-10-CM | POA: Diagnosis not present

## 2018-03-16 LAB — COMPREHENSIVE METABOLIC PANEL
ALBUMIN: 4.4 g/dL (ref 3.5–5.0)
ALK PHOS: 71 U/L (ref 38–126)
ALT: 33 U/L (ref 17–63)
ANION GAP: 12 (ref 5–15)
AST: 50 U/L — ABNORMAL HIGH (ref 15–41)
BUN: 17 mg/dL (ref 6–20)
CALCIUM: 9.8 mg/dL (ref 8.9–10.3)
CO2: 24 mmol/L (ref 22–32)
Chloride: 102 mmol/L (ref 101–111)
Creatinine, Ser: 0.85 mg/dL (ref 0.61–1.24)
GFR calc Af Amer: >60 mL/min (ref >60)
GFR calc non Af Amer: >60 mL/min (ref >60)
Glucose, Bld: 141 mg/dL — ABNORMAL HIGH (ref 65–99)
POTASSIUM: 4.1 mmol/L (ref 3.5–5.1)
SODIUM: 138 mmol/L (ref 135–145)
TOTAL PROTEIN: 8.2 g/dL — AB (ref 6.5–8.1)
Total Bilirubin: 1 mg/dL (ref 0.3–1.2)

## 2018-03-16 LAB — CBC
HCT: 42 % (ref 40.0–52.0)
HEMOGLOBIN: 14.5 g/dL (ref 13.0–18.0)
MCH: 32.4 pg (ref 26.0–34.0)
MCHC: 34.6 g/dL (ref 32.0–36.0)
MCV: 93.5 fL (ref 80.0–100.0)
Platelets: 339 10*3/uL (ref 150–440)
RBC: 4.49 MIL/uL (ref 4.40–5.90)
RDW: 14.5 % (ref 11.5–14.5)
WBC: 10 10*3/uL (ref 3.8–10.6)

## 2018-03-16 LAB — URINALYSIS, COMPLETE (UACMP) WITH MICROSCOPIC
Bacteria, UA: NONE SEEN
Bilirubin Urine: NEGATIVE
GLUCOSE, UA: NEGATIVE mg/dL
HGB URINE DIPSTICK: NEGATIVE
Ketones, ur: NEGATIVE mg/dL
LEUKOCYTES UA: NEGATIVE
NITRITE: NEGATIVE
Protein, ur: 30 mg/dL — AB
Specific Gravity, Urine: 1.018 (ref 1.005–1.030)
Squamous Epithelial / LPF: NONE SEEN (ref 0–5)
pH: 8 (ref 5.0–8.0)

## 2018-03-16 LAB — TROPONIN I: Troponin I: <0.03 ng/mL (ref <0.03)

## 2018-03-16 LAB — URINE DRUG SCREEN, QUALITATIVE (ARMC ONLY)
Amphetamines, Ur Screen: NOT DETECTED
Barbiturates, Ur Screen: NOT DETECTED
Benzodiazepine, Ur Scrn: NOT DETECTED
CANNABINOID 50 NG, UR ~~LOC~~: NOT DETECTED
COCAINE METABOLITE, UR ~~LOC~~: NOT DETECTED
MDMA (ECSTASY) UR SCREEN: NOT DETECTED
Methadone Scn, Ur: NOT DETECTED
OPIATE, UR SCREEN: NOT DETECTED
Phencyclidine (PCP) Ur S: NOT DETECTED
Tricyclic, Ur Screen: NOT DETECTED

## 2018-03-16 LAB — GLUCOSE, CAPILLARY: GLUCOSE-CAPILLARY: 102 mg/dL — AB (ref 65–99)

## 2018-03-16 LAB — TSH: TSH: 1.028 u[IU]/mL (ref 0.350–4.500)

## 2018-03-16 LAB — ETHANOL: Alcohol, Ethyl (B): <10 mg/dL (ref <10)

## 2018-03-16 LAB — T4, FREE: FREE T4: 1.08 ng/dL (ref 0.61–1.12)

## 2018-03-16 LAB — SALICYLATE LEVEL: Salicylate Lvl: <7 mg/dL (ref 2.8–30.0)

## 2018-03-16 LAB — ACETAMINOPHEN LEVEL: Acetaminophen (Tylenol), Serum: <10 ug/mL — ABNORMAL LOW (ref 10–30)

## 2018-03-16 MED ORDER — DIVALPROEX SODIUM 500 MG PO DR TAB
500.0000 mg | DELAYED_RELEASE_TABLET | Freq: Two times a day (BID) | ORAL | Status: DC
  Administered 2018-03-16 (×2): 500 mg via ORAL
  Filled 2018-03-16: qty 1

## 2018-03-16 MED ORDER — ALUM & MAG HYDROXIDE-SIMETH 200-200-20 MG/5ML PO SUSP: 30.0000 mL | ORAL | Status: DC | PRN

## 2018-03-16 MED ORDER — METOPROLOL TARTRATE 25 MG PO TABS
25.0000 mg | ORAL_TABLET | Freq: Two times a day (BID) | ORAL | Status: DC
  Administered 2018-03-16: 25 mg via ORAL
  Filled 2018-03-16: qty 1

## 2018-03-16 MED ORDER — FINASTERIDE 5 MG PO TABS
5.0000 mg | ORAL_TABLET | Freq: Every day | ORAL | Status: DC
  Administered 2018-03-17 – 2018-03-25 (×8): 5 mg via ORAL
  Filled 2018-03-16 (×10): qty 1

## 2018-03-16 MED ORDER — ASPIRIN EC 81 MG PO TBEC
DELAYED_RELEASE_TABLET | ORAL | Status: AC
  Filled 2018-03-16: qty 1

## 2018-03-16 MED ORDER — DIVALPROEX SODIUM 500 MG PO DR TAB
500.0000 mg | DELAYED_RELEASE_TABLET | Freq: Two times a day (BID) | ORAL | Status: DC
  Administered 2018-03-17 – 2018-03-25 (×17): 500 mg via ORAL
  Filled 2018-03-16 (×21): qty 1

## 2018-03-16 MED ORDER — PROPRANOLOL HCL ER 60 MG PO CP24
60.0000 mg | ORAL_CAPSULE | Freq: Every day | ORAL | Status: DC
  Administered 2018-03-16: 60 mg via ORAL
  Filled 2018-03-16: qty 1

## 2018-03-16 MED ORDER — ACETAMINOPHEN 325 MG PO TABS: 650.0000 mg | ORAL_TABLET | Freq: Four times a day (QID) | ORAL | Status: DC | PRN

## 2018-03-16 MED ORDER — FLUOXETINE HCL 20 MG PO CAPS
20.0000 mg | ORAL_CAPSULE | Freq: Every day | ORAL | Status: DC
  Administered 2018-03-16: 20 mg via ORAL

## 2018-03-16 MED ORDER — MAGNESIUM HYDROXIDE 400 MG/5ML PO SUSP: 30.0000 mL | Freq: Every day | ORAL | Status: DC | PRN

## 2018-03-16 MED ORDER — SIMVASTATIN 10 MG PO TABS
ORAL_TABLET | ORAL | Status: AC
  Filled 2018-03-16: qty 1

## 2018-03-16 MED ORDER — DIPHENHYDRAMINE HCL 25 MG PO CAPS
50.0000 mg | ORAL_CAPSULE | Freq: Once | ORAL | Status: AC
  Administered 2018-03-16: 50 mg via ORAL
  Filled 2018-03-16: qty 2

## 2018-03-16 MED ORDER — RIVAROXABAN 20 MG PO TABS
20.0000 mg | ORAL_TABLET | Freq: Every day | ORAL | Status: DC
  Administered 2018-03-16: 20 mg via ORAL
  Filled 2018-03-16: qty 1

## 2018-03-16 MED ORDER — ZIPRASIDONE MESYLATE 20 MG IM SOLR
20.0000 mg | Freq: Once | INTRAMUSCULAR | Status: AC
  Administered 2018-03-16: 20 mg via INTRAMUSCULAR
  Filled 2018-03-16: qty 20

## 2018-03-16 MED ORDER — HYDROXYZINE HCL 50 MG PO TABS
50.0000 mg | ORAL_TABLET | Freq: Three times a day (TID) | ORAL | Status: DC | PRN
  Administered 2018-03-18: 50 mg via ORAL
  Filled 2018-03-16: qty 1

## 2018-03-16 MED ORDER — SODIUM CHLORIDE 0.9 % IV BOLUS
1000.0000 mL | Freq: Once | INTRAVENOUS | Status: AC
  Administered 2018-03-16: 1000 mL via INTRAVENOUS

## 2018-03-16 MED ORDER — ASPIRIN EC 81 MG PO TBEC
81.0000 mg | DELAYED_RELEASE_TABLET | Freq: Every day | ORAL | Status: DC
  Administered 2018-03-17 – 2018-03-25 (×9): 81 mg via ORAL
  Filled 2018-03-16 (×9): qty 1

## 2018-03-16 MED ORDER — OLANZAPINE 5 MG PO TABS
15.0000 mg | ORAL_TABLET | Freq: Every day | ORAL | Status: DC
  Administered 2018-03-16: 15 mg via ORAL
  Filled 2018-03-16: qty 1

## 2018-03-16 MED ORDER — DIVALPROEX SODIUM 500 MG PO DR TAB
DELAYED_RELEASE_TABLET | ORAL | Status: AC
  Filled 2018-03-16: qty 1

## 2018-03-16 MED ORDER — FINASTERIDE 5 MG PO TABS: 5.0000 mg | ORAL_TABLET | Freq: Every day | ORAL | Status: DC

## 2018-03-16 MED ORDER — FLUOXETINE HCL 20 MG PO CAPS
ORAL_CAPSULE | ORAL | Status: AC
  Filled 2018-03-16: qty 1

## 2018-03-16 MED ORDER — OLANZAPINE 5 MG PO TABS
15.0000 mg | ORAL_TABLET | Freq: Every day | ORAL | Status: DC
  Administered 2018-03-17 – 2018-03-24 (×8): 15 mg via ORAL
  Filled 2018-03-16 (×8): qty 1

## 2018-03-16 MED ORDER — PROPRANOLOL HCL ER 60 MG PO CP24
60.0000 mg | ORAL_CAPSULE | Freq: Every day | ORAL | Status: DC
  Administered 2018-03-17 – 2018-03-24 (×8): 60 mg via ORAL
  Filled 2018-03-16 (×8): qty 1

## 2018-03-16 MED ORDER — TEMAZEPAM 15 MG PO CAPS
15.0000 mg | ORAL_CAPSULE | Freq: Every evening | ORAL | Status: DC | PRN
  Administered 2018-03-16: 15 mg via ORAL
  Filled 2018-03-16: qty 1

## 2018-03-16 MED ORDER — SIMVASTATIN 10 MG PO TABS
10.0000 mg | ORAL_TABLET | Freq: Every day | ORAL | Status: DC
  Administered 2018-03-16: 10 mg via ORAL

## 2018-03-16 MED ORDER — FLUOXETINE HCL 20 MG PO CAPS
20.0000 mg | ORAL_CAPSULE | Freq: Every day | ORAL | Status: DC
  Administered 2018-03-17 – 2018-03-25 (×9): 20 mg via ORAL
  Filled 2018-03-16 (×9): qty 1

## 2018-03-16 MED ORDER — SIMVASTATIN 10 MG PO TABS
10.0000 mg | ORAL_TABLET | Freq: Every day | ORAL | Status: DC
  Administered 2018-03-17 – 2018-03-25 (×9): 10 mg via ORAL
  Filled 2018-03-16 (×11): qty 1

## 2018-03-16 MED ORDER — RIVAROXABAN 20 MG PO TABS
20.0000 mg | ORAL_TABLET | Freq: Every day | ORAL | Status: DC
  Administered 2018-03-17 – 2018-03-25 (×9): 20 mg via ORAL
  Filled 2018-03-16 (×9): qty 1

## 2018-03-16 MED ORDER — ASPIRIN EC 81 MG PO TBEC
81.0000 mg | DELAYED_RELEASE_TABLET | Freq: Every day | ORAL | Status: DC
  Administered 2018-03-16: 81 mg via ORAL
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
## 2018-03-16 NOTE — ED Notes (Signed)
This Clinical research associatewriter gave patient medication injection in right deltiod. Fellow RN Velora MediateHerny in room for safety.

## 2018-03-16 NOTE — ED Notes (Signed)
Breakfast tray placed in pt room. Pt sleeping 

## 2018-03-16 NOTE — ED Notes (Signed)
IVC/No consult ordered at this time

## 2018-03-16 NOTE — ED Notes (Signed)
ED Is the patient under IVC or is there intent for IVC: Yes.   Is the patient medically cleared: Yes.   Is there vacancy in the ED BHU: Yes.   Is the population mix appropriate for patient: Yes.   Is the patient awaiting placement in inpatient or outpatient setting:  Has the patient had a psychiatric consult: Yes.   Survey of unit performed for contraband, proper placement and condition of furniture, tampering with fixtures in bathroom, shower, and each patient room: Yes.  ; Findings:  APPEARANCE/BEHAVIOR Calm and cooperative NEURO ASSESSMENT Orientation: oriented x3  Denies pain Hallucinations: No.None noted (Hallucinations)  denies Speech: Normal Gait: normal RESPIRATORY ASSESSMENT Even  Unlabored respirations  CARDIOVASCULAR ASSESSMENT Pulses equal   regular rate  Skin warm and dry   GASTROINTESTINAL ASSESSMENT no GI complaint EXTREMITIES Full ROM  PLAN OF CARE Provide calm/safe environment. Vital signs assessed twice daily. ED BHU Assessment once each 12-hour shift. Collaborate with TTS daily or as condition indicates. Assure the ED provider has rounded once each shift. Provide and encourage hygiene. Provide redirection as needed. Assess for escalating behavior; address immediately and inform ED provider.  Assess family dynamic and appropriateness for visitation as needed: Yes.  ; If necessary, describe findings:  Educate the patient/family about BHU procedures/visitation: Yes.  ; If necessary, describe findings:   

## 2018-03-16 NOTE — ED Notes (Signed)
BEHAVIORAL HEALTH ROUNDING Patient sleeping: No. Patient alert and oriented: yes Behavior appropriate: Yes.  ; If no, describe:  Nutrition and fluids offered: yes Toileting and hygiene offered: Yes  Sitter present: q15 minute observations and security monitoring Law enforcement present: Yes    

## 2018-03-16 NOTE — ED Provider Notes (Addendum)
-----------------------------------------   3:55 PM on 03/16/2018 -----------------------------------------   Blood pressure (!) 143/86, pulse (!) 108, temperature 100.1 F (37.8 C), temperature source Oral, resp. rate 20, weight 121.1 kg (267 lb), SpO2 96 %.  Was brought patient's EKG to sign and patient found to be in sinus tachycardia with rate 139. He has diffuse ST depressions with less than 1 mm ST elevation in aVR.  Review of old EKG from March 2018 shows that these changes are chronic for the patient.  I have asked the nurse to recheck.  Her vital signs since patient had a temp of 100.1 on arrival.  I went to reevaluate the patient with tells me that he feels great.  He denies URI symptoms, chills, shortness of breath or chest pain, headache, neck stiffness, rash, abdominal pain, nausea, vomiting, diarrhea, or dysuria.  Patient has not received his morning dose of beta-blockers any could be the reason why he is tachycardic.  That will be given to him right now.  We will also provide with a liter fluid in case patient is slightly dehydrated.  I ordered a troponin as well.  ED ECG REPORT I, Nita Sicklearolina Kaige Whistler, the attending physician, personally viewed and interpreted this ECG.  Sinus tachycardia, rate of 139, normal intervals, normal axis, diffuse ST depressions in inferior lateral leads with less than 1 mm elevation in aVR.  EKG is unchanged when compared to prior from 3/18.   _________________________ 7:44 PM on 03/16/2018 -----------------------------------------  UA with no evidence of dehydration or UTI. CXR negative for PNA. Patient received 1L NS and drank almost 1L of water since this am. Remains persistently tachycardic. Temp 98.53F. Patient is completely asymptomatic and denies CP, palpitations, SOB, or any other complaints. Review of Epic shows that patient was last dc from the Hospital on 01/2017 and he was on metoprolol. Not sure when or if this has been dc'ed. I will restart  patient on it now and reassess.  _________________________ 10:01 PM on 03/16/2018 -----------------------------------------  Patient remains well appearing. HR is now 82. Patient to be admitted to psych ward.  ED ECG REPORT I, Nita Sicklearolina Tangie Stay, the attending physician, personally viewed and interpreted this ECG.  Normal sinus rhythm, rate of 77, normal intervals, normal axis, no ST elevations or depressions.  Normal EKG.   Nita SickleVeronese, [place], MD 03/16/18 [phone]    Nita SickleVeronese, [place], MD 03/16/18 2233

## 2018-03-16 NOTE — ED Notes (Signed)
Unable to assess patient. When asking patient assessment questions patient just replies, "hot cold water, I am praying." Patient dressed out by this Clinical research associatewriter and ED techs Beth and Mayra. Belongings bagged and labeled with patient info. Patient is IVC'ed.

## 2018-03-16 NOTE — ED Notes (Signed)
BEHAVIORAL HEALTH ROUNDING Patient sleeping: No. Patient alert and oriented: yes Behavior appropriate: Yes.  ; If no, describe:  Nutrition and fluids offered: yes Toileting and hygiene offered: Yes  Sitter present: q15 minute observations and security monitoring Law enforcement present: Yes    No am meds ordered  - assessment completed

## 2018-03-16 NOTE — ED Notes (Signed)
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
## 2018-03-16 NOTE — ED Notes (Signed)
BEHAVIORAL HEALTH ROUNDING Patient sleeping: Yes.   Patient alert and oriented: eyes closed  Appears to be asleep Behavior appropriate: Yes.  ; If no, describe:  Nutrition and fluids offered: Yes  Toileting and hygiene offered: sleeping Sitter present: q 15 minute observations and security monitoring Law enforcement present: yes   
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
## 2018-03-16 NOTE — ED Notes (Signed)
PT IVC PENDING PSYCH CONSULT. 

## 2018-03-16 NOTE — ED Notes (Signed)
Patient observed lying in bed with eyes closed  Even, unlabored respirations observed   NAD pt appears to be sleeping  I will continue to monitor along with every 15 minute visual observations and ongoing security monitoring    

## 2018-03-16 NOTE — ED Triage Notes (Signed)
Pt brought in by [place] PD pt has been staying in hotel by himself sister called for inappropriate behavior, pt has psych history. Pt not answering questions appropriately, keep repeating ":I just wanna sleep".

## 2018-03-16 NOTE — ED Notes (Signed)
[place] [facility] [place] Patient agreeable with plan of care. Patient denies SI/HI/AVH.

## 2018-03-16 NOTE — BH Assessment (Signed)
[facility] by Dr. Toni Amendlapacs.  Attending Physician will be Dr. Jennet MaduroPucilowska.   Patient has been assigned to room 312, by [facility] Charge Nurse Gwen.   Intake Paper Work has been signed and placed on patient chart.  ER staff is aware of the admission:  Emily: ER Secretary   Dr. Derrill KayGoodman: ER MD   Amy: Patient's Nurse   Mertie ClauseJeanelle: Patient Access.

## 2018-03-16 NOTE — BH Assessment (Signed)
Per Dr. Clapac's patient meets criteria for inpatient psychiatric treatment.  

## 2018-03-16 NOTE — ED Notes (Signed)
Patient currently resting with eyes closed

## 2018-03-16 NOTE — ED Notes (Signed)
Patient given meal tray and water per request by this EDT.

## 2018-03-16 NOTE — ED Notes (Signed)
Patient is awake. Sandwich tray given. Patient states episode is over with. Pt states he just misses his mom she passed away 8 years ago.

## 2018-03-16 NOTE — ED Notes (Signed)
Pt given supper tray.

## 2018-03-17 ENCOUNTER — Other Ambulatory Visit: Payer: Self-pay | Admitting: Psychiatry

## 2018-03-17 ENCOUNTER — Other Ambulatory Visit: Payer: Self-pay

## 2018-03-17 ENCOUNTER — Encounter: Payer: Self-pay | Admitting: Psychiatry

## 2018-03-17 DIAGNOSIS — N400 Enlarged prostate without lower urinary tract symptoms: Secondary | ICD-10-CM | POA: Diagnosis present

## 2018-03-17 DIAGNOSIS — F312 Bipolar disorder, current episode manic severe with psychotic features: Principal | ICD-10-CM

## 2018-03-17 DIAGNOSIS — E785 Hyperlipidemia, unspecified: Secondary | ICD-10-CM | POA: Diagnosis present

## 2018-03-17 LAB — LIPID PANEL
CHOLESTEROL: 155 mg/dL (ref 0–200)
HDL: 33 mg/dL — ABNORMAL LOW (ref >40)
LDL Cholesterol: 107 mg/dL — ABNORMAL HIGH (ref 0–99)
Total CHOL/HDL Ratio: 4.7 RATIO
Triglycerides: 73 mg/dL (ref <150)
VLDL: 15 mg/dL (ref 0–40)

## 2018-03-17 LAB — URINALYSIS, COMPLETE (UACMP) WITH MICROSCOPIC
Bacteria, UA: NONE SEEN
Bilirubin Urine: NEGATIVE
GLUCOSE, UA: NEGATIVE mg/dL
HGB URINE DIPSTICK: NEGATIVE
Ketones, ur: NEGATIVE mg/dL
Leukocytes, UA: NEGATIVE
NITRITE: NEGATIVE
PH: 8 (ref 5.0–8.0)
PROTEIN: NEGATIVE mg/dL
SPECIFIC GRAVITY, URINE: 1.019 (ref 1.005–1.030)
Squamous Epithelial / LPF: NONE SEEN (ref 0–5)

## 2018-03-17 LAB — URINE DRUG SCREEN, QUALITATIVE (ARMC ONLY)
AMPHETAMINES, UR SCREEN: NOT DETECTED
Barbiturates, Ur Screen: NOT DETECTED
Benzodiazepine, Ur Scrn: NOT DETECTED
CANNABINOID 50 NG, UR ~~LOC~~: NOT DETECTED
COCAINE METABOLITE, UR ~~LOC~~: NOT DETECTED
MDMA (Ecstasy)Ur Screen: NOT DETECTED
Methadone Scn, Ur: NOT DETECTED
OPIATE, UR SCREEN: NOT DETECTED
PHENCYCLIDINE (PCP) UR S: NOT DETECTED
Tricyclic, Ur Screen: NOT DETECTED

## 2018-03-17 LAB — HEMOGLOBIN A1C
Hgb A1c MFr Bld: 5.5 % (ref 4.8–5.6)
MEAN PLASMA GLUCOSE: 111.15 mg/dL

## 2018-03-17 LAB — TSH: TSH: 1.746 u[IU]/mL (ref 0.350–4.500)

## 2018-03-17 LAB — VALPROIC ACID LEVEL: VALPROIC ACID LVL: 41 ug/mL — AB (ref 50.0–100.0)

## 2018-03-17 MED ORDER — TEMAZEPAM 15 MG PO CAPS
30.0000 mg | ORAL_CAPSULE | Freq: Every day | ORAL | Status: DC
  Administered 2018-03-17 – 2018-03-24 (×8): 30 mg via ORAL
  Filled 2018-03-17 (×8): qty 2
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
## 2018-03-17 NOTE — [facility] Group Notes (Signed)
  03/17/2018  Time: 1PM  Type of Therapy/Topic:  Group Therapy:  Balance in Life  Participation Level:  Active  Description of Group:   This group will address the concept of balance and how it feels and looks when one is unbalanced. Patients will be encouraged to process areas in their lives that are out of balance and identify reasons for remaining unbalanced. Facilitators will guide patients in utilizing problem-solving interventions to address and correct the stressor making their life unbalanced. Understanding and applying boundaries will be explored and addressed for obtaining and maintaining a balanced life. Patients will be encouraged to explore ways to assertively make their unbalanced needs known to significant others in their lives, using other group members and facilitator for support and feedback.  Therapeutic Goals: 1. Patient will identify two or more emotions or situations they have that consume much of in their lives. 2. Patient will identify signs/triggers that life has become out of balance:  3. Patient will identify two ways to set boundaries in order to achieve balance in their lives:  4. Patient will demonstrate ability to communicate their needs through discussion and/or role plays  Summary of Patient Progress: Pt continues to work towards their tx goals but has not yet reached them. Pt was able to participate in group discussion, though was hyperverbal at times. Pt was able to accept redirection from CSW. Pt reported he feels his family, religion, and hobbies help keep his life in balance. Pt was not able to provide further insight regarding keeping his life in balance.  Therapeutic Modalities:   Cognitive Behavioral Therapy Solution-Focused Therapy Assertiveness Training  Heidi DachKelsey Glyn Zendejas, MSW, LCSW Clinical Social Worker 03/17/2018 2:09 PM

## 2018-03-17 NOTE — Tx Team (Signed)
Initial Treatment Plan 03/17/2018 12:34 AM [name] UJW:[id number]    PATIENT STRESSORS: Loss of mother Medication change or noncompliance   PATIENT STRENGTHS: Active sense of humor Average or above average intelligence Supportive family/friends   PATIENT IDENTIFIED PROBLEMS: Psychosis 03/16/2018                     DISCHARGE CRITERIA:  Ability to meet basic life and health needs Adequate post-discharge living arrangements Improved stabilization in mood, thinking, and/or behavior Motivation to continue treatment in a less acute level of care Safe-care adequate arrangements made Verbal commitment to aftercare and medication compliance  PRELIMINARY DISCHARGE PLAN: Attend aftercare/continuing care group Outpatient therapy Placement in alternative living arrangements  PATIENT/FAMILY INVOLVEMENT: This treatment plan has been presented to and reviewed with the patient, Kyle [name], and/or family member.  The patient and family have been given the opportunity to ask questions and make suggestions.  Galen ManilaAlexis E Jakaya Jacobowitz, RN 03/17/2018, 12:34 AM

## 2018-03-17 NOTE — Plan of Care (Signed)
Patient is improved in all areas, medication  compliance  is good , socializing well with peers with out any issues, participating in scheduled activities and assertive, frequent use of word salad noted and some time drift  away from actual conversation, patient states that he is doing well, patient is safe in the unit and contract for safety, denies SI/HI and no sign of AVH Problem: Activity: Goal: Will verbalize the importance of balancing activity with adequate rest periods Outcome: Progressing   Problem: Education: Goal: Will be free of psychotic symptoms Outcome: Progressing Goal: Knowledge of the prescribed therapeutic regimen will improve Outcome: Progressing   Problem: Coping: Goal: Coping ability will improve Outcome: Progressing Goal: Will verbalize feelings Outcome: Progressing   Problem: Health Behavior/Discharge Planning: Goal: Compliance with prescribed medication regimen will improve Outcome: Progressing   Problem: Nutritional: Goal: Ability to achieve adequate nutritional intake will improve Outcome: Progressing   Problem: Role Relationship: Goal: Ability to communicate needs accurately will improve Outcome: Progressing Goal: Ability to interact with others will improve Outcome: Progressing   Problem: Safety: Goal: Ability to redirect hostility and anger into socially appropriate behaviors will improve Outcome: Progressing Goal: Ability to remain free from injury will improve Outcome: Progressing   Problem: Self-Care: Goal: Ability to participate in self-care as condition permits will improve Outcome: Progressing   Problem: Self-Concept: Goal: Will verbalize positive feelings about self Outcome: Progressing   Problem: Education: Goal: Knowledge of [facility] Education information/materials will improve Outcome: Progressing Goal: Emotional status will improve Outcome: Progressing Goal: Mental status will improve Outcome: Progressing

## 2018-03-17 NOTE — [facility] Suicide Risk Assessment (Addendum)
[facility] Admission Suicide Risk Assessment   Nursing information obtained from:  Patient Demographic factors:  Male, Age 72 or older, Caucasian, Living alone, Unemployed Current Mental Status:  NA Loss Factors:  Loss of significant relationship Historical Factors:  NA Risk Reduction Factors:  Positive social support  Total Time spent with patient: 1 hour Principal Problem: Bipolar I disorder, current or most recent episode manic, with psychotic features (HCC) Diagnosis:   Patient Active Problem List   Diagnosis Date Noted  . Bipolar I disorder, current or most recent episode manic, with psychotic features (HCC) [F31.2] 03/16/2018    Priority: High  . BPH (benign prostatic hyperplasia) [N40.0] 03/17/2018  . Dyslipidemia [E78.5] 03/17/2018  . History of stroke [Z86.73] 03/16/2018  . Current use of anticoagulant therapy [Z79.01] 03/16/2018  . History of pulmonary embolism [Z86.711] 03/16/2018  . Bipolar 1 disorder, depressed (HCC) [F31.9] 02/03/2017  . 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 Diabetes (HCC) [E11.9] 01/26/2017  . Suicide attempt (HCC) [T14.91XA]   . SVT (supraventricular tachycardia) (HCC) [I47.1]   . Noncompliance [Z91.19] 09/26/2015  . RLS (restless legs syndrome) [G25.81] 08/08/2015  . Peripheral neuropathy (HCC) [G62.9] 08/08/2015  . Cerebrovascular disease [I67.9] 08/07/2015  . 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 Essential tremor [G25.0] 08/01/2015  . Hypertension [I10] 04/17/2015   Subjective Data: psychotic breake  Continued Clinical Symptoms:  Alcohol Use Disorder Identification Test Final Score (AUDIT): 4 The "Alcohol Use Disorders Identification Test", Guidelines for Use in Primary Care, Second Edition.  World Science writer [facility]). Score between 0-7:  no or low risk or alcohol related problems. Score between 8-15:  moderate risk of alcohol related problems. Score between 16-19:  high risk of alcohol related  problems. Score 20 or above:  warrants further diagnostic evaluation for alcohol dependence and treatment.   CLINICAL FACTORS:   Bipolar Disorder:   Mixed State Currently Psychotic Previous Psychiatric Diagnoses and Treatments Medical Diagnoses and Treatments/Surgeries   Musculoskeletal: Strength & Muscle Tone: within normal limits Gait & Station: normal Patient leans: N/A  Psychiatric Specialty Exam: Physical Exam  Nursing note and vitals reviewed. Psychiatric: His affect is angry and inappropriate. His speech is tangential. He is withdrawn. Thought content is paranoid and delusional. Cognition and memory are impaired. He expresses impulsivity.    Review of Systems  Neurological: Negative.   Psychiatric/Behavioral: The patient has insomnia.   All other systems reviewed and are negative.   Blood pressure 113/74, pulse (!) 130, temperature 97.9 F (36.6 C), temperature source Oral, resp. rate 18, height 5\' 7"  (1.702 m), weight 101.6 kg (224 lb), SpO2 95 %.Body mass index is 35.08 kg/m.  General Appearance: Fairly Groomed  Eye Contact:  Good  Speech:  Pressured and Slurred  Volume:  Normal  Mood:  Angry, Dysphoric and Irritable  Affect:  Congruent  Thought Process:  Disorganized, Irrelevant and Descriptions of Associations: Loose  Orientation:  Full (Time, Place, and Person)  Thought Content:  Illogical, Delusions and Paranoid Ideation  Suicidal Thoughts:  No  Homicidal Thoughts:  No  Memory:  Immediate;   Poor Recent;   Poor Remote;   Poor  Judgement:  Poor  Insight:  Lacking  Psychomotor Activity:  Increased  Concentration:  Concentration: Poor and Attention Span: Poor  Recall:  Poor  Fund of Knowledge:  Poor  Language:  Poor  Akathisia:  No  Handed:  Right  AIMS (if indicated):     Assets:  Communication Skills Desire for Improvement Financial Resources/Insurance Physical  Health Resilience Social Support  ADL's:  Intact  Cognition:  WNL  Sleep:  Number of  Hours: 2      COGNITIVE FEATURES THAT CONTRIBUTE TO RISK:  None    SUICIDE RISK:   Moderate:  Frequent suicidal ideation with limited intensity, and duration, some specificity in terms of plans, no associated intent, good self-control, limited dysphoria/symptomatology, some risk factors present, and identifiable protective factors, including available and accessible social support.  PLAN OF CARE: hospital admission, medication management, discharge planning.  [name] is a 72 year old male with a history of bipolar illness admitted floridly psychotic and disorganized.  #Mood/psychosis, disorganized thinking and behavior -restarted Depakote 500 mg BID, VPA on admission 41 -restarted Zyprexa 15 mg nightly -restarted Prozac 40 mg daily  #Insomnia, slept 2 hours only -Restoril 30 mg nightly  #Anxiety -Vistaril is available  #H/O DVT -ASA 81 mg -Xarelto 20 mg daily  #Dyslipidemia -Zocor 10 mg daily  #BPH -Prosca 5 mg daily  #Familial tremor -Inderal 60 mg daily  #Metabolic synsdrome monitoring -Lipid panel, TSH and HgbA1C are pending -EKG pending  #Disposition -TBE  I certify that inpatient services furnished can reasonably be expected to improve the patient's condition.   Kristine LineaJolanta Tristina Sahagian, MD 03/17/2018, 2:32 PM

## 2018-03-17 NOTE — Progress Notes (Signed)
Patient ID: Kyle [name], [name]   DOB: 22-Jun-1946, 72 y.o.   MRN: [id number] PER STATE REGULATIONS 482.30  THIS CHART WAS REVIEWED FOR MEDICAL NECESSITY WITH RESPECT TO THE PATIENT'S ADMISSION/DURATION OF STAY.  NEXT REVIEW DATE:03/20/18  Loura HaltBARBARA Kaelan Emami, RN, BSN CASE MANAGER

## 2018-03-17 NOTE — [facility] Group Notes (Signed)
LCSW Group Therapy Note 03/17/2018 1:15pm  Type of Therapy and Topic:  Group Therapy:  Setting Goals  Participation Level:  Minimal  Description of Group: In this process group, patients discussed using strengths to work toward goals and address challenges.  Patients identified two positive things about themselves and one goal they were working on.  Patients were given the opportunity to share openly and support each other's plan for self-empowerment.  The group discussed the value of gratitude and were encouraged to have a daily reflection of positive characteristics or circumstances.  Patients were encouraged to identify a plan to utilize their strengths to work on current challenges and goals.  Therapeutic Goals 1. Patient will verbalize personal strengths/positive qualities and relate how these can assist with achieving desired personal goals 2. Patients will verbalize affirmation of peers plans for personal change and goal setting 3. Patients will explore the value of gratitude and positive focus as related to successful achievement of goals 4. Patients will verbalize a plan for regular reinforcement of personal positive qualities and circumstances.  Summary of Patient Progress:  Kyle KaufmanMarvin was unable to actively participate in today's group on setting goals due to active disorganized thinking and erratic speech.  Kyle KaufmanMarvin was unable to share his name with CSW and engaged in a conversation with CSW, but went on a tangent discussing his desire to adopt several children and have a free daycare.  Kyle KaufmanMarvin also ruminated on going to church a week ago.  Kyle KaufmanMarvin also shared that he has been unable to "deal" with the death of his mother a year ago and that he always struggles with depression on the anniversary of her death.     Therapeutic Modalities Cognitive Behavioral Therapy Motivational Interviewing    Kyle FrameSonya S Talbert [name], [place]CS[place] 03/17/2018 3:15 PM

## 2018-03-17 NOTE — Plan of Care (Signed)
Patient continues to show signs disorganized thoughts and confusion. When in the dining room this morning after breakfast, patient stated to this writer, "That's my grandma right there, she won't admit it but she is." Patient was referring to a MHT. Patient denies SI/HI/AVH and laughed when asked the question. Denies pain and is ambulatory on the unit without any difficulty. Compliant with medications and states that his goal for today is to go home. Reports that he slept good last night with the use of sleep medications, energy level and concentration is high and appetite is good. Denies depression and anxiety at this time. Will continue to monitor.

## 2018-03-17 NOTE — Progress Notes (Signed)
Recreation Therapy Notes  Date: 03/17/2018  Time: 9:30 am   Location: Craft Room   Behavioral response: N/A   Intervention Topic: Goals  Discussion/Intervention: Patient did not attend group.   Clinical Observations/Feedback:  Patient did not attend group.   Kyle [name] LRT/CTRS        Kyle [name] 03/17/2018 10:51 AM 

## 2018-03-17 NOTE — Plan of Care (Signed)
New Admit  Problem: Activity: Goal: Will verbalize the importance of balancing activity with adequate rest periods Outcome: Not Progressing   Problem: Education: Goal: Will be free of psychotic symptoms Outcome: Not Progressing Goal: Knowledge of the prescribed therapeutic regimen will improve Outcome: Not Progressing   Problem: Coping: Goal: Coping ability will improve Outcome: Not Progressing Goal: Will verbalize feelings Outcome: Not Progressing   Problem: Health Behavior/Discharge Planning: Goal: Compliance with prescribed medication regimen will improve Outcome: Not Progressing   Problem: Nutritional: Goal: Ability to achieve adequate nutritional intake will improve Outcome: Not Progressing   Problem: Role Relationship: Goal: Ability to communicate needs accurately will improve Outcome: Not Progressing Goal: Ability to interact with others will improve Outcome: Not Progressing   Problem: Safety: Goal: Ability to redirect hostility and anger into socially appropriate behaviors will improve Outcome: Not Progressing Goal: Ability to remain free from injury will improve Outcome: Not Progressing   Problem: Self-Care: Goal: Ability to participate in self-care as condition permits will improve Outcome: Not Progressing   Problem: Self-Concept: Goal: Will verbalize positive feelings about self Outcome: Not Progressing   Problem: Education: Goal: Knowledge of [facility] Education information/materials will improve Outcome: Not Progressing Goal: Emotional status will improve Outcome: Not Progressing Goal: Mental status will improve Outcome: Not Progressing

## 2018-03-17 NOTE — [facility] Group Notes (Signed)
[facility] Group Notes:  (Nursing/MHT/Case Management/Adjunct)  Date:  03/17/2018  Time:  3:14 PM  Type of Therapy:  Psychoeducational Skills  Participation Level:  Did Not Attend   Kyle [name] Ellison 03/17/2018, 3:14 PM
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
## 2018-03-17 NOTE — [facility] Group Notes (Signed)
[facility] Group Notes:  (Nursing/MHT/Case Management/Adjunct)  Date:  03/17/2018  Time:  9:37 PM  Type of Therapy:  Group Therapy  Participation Level:  Active  Participation Quality:  Appropriate  Affect:  Appropriate  Cognitive:  Appropriate  Insight:  Good  Engagement in Group:  Engaged  Modes of Intervention:  Support  Summary of Progress/Problems:  Kyle [name] 03/17/2018, 9:37 PM

## 2018-03-18 ENCOUNTER — Inpatient Hospital Stay: Payer: Medicare Other

## 2018-03-18 ENCOUNTER — Inpatient Hospital Stay: Payer: Medicare Other | Admitting: Anesthesiology

## 2018-03-18 MED ORDER — FENTANYL CITRATE (PF) 100 MCG/2ML IJ SOLN: 25.0000 ug | INTRAMUSCULAR | Status: DC | PRN

## 2018-03-18 MED ORDER — SODIUM CHLORIDE 0.9 % IV SOLN
500.0000 mL | Freq: Once | INTRAVENOUS | Status: AC
  Administered 2018-03-18: 500 mL via INTRAVENOUS

## 2018-03-18 MED ORDER — METHOHEXITAL SODIUM 100 MG/10ML IV SOSY
PREFILLED_SYRINGE | INTRAVENOUS | Status: DC | PRN
  Administered 2018-03-18: 80 mg via INTRAVENOUS

## 2018-03-18 MED ORDER — ONDANSETRON HCL 4 MG/2ML IJ SOLN: 4.0000 mg | Freq: Once | INTRAMUSCULAR | Status: DC | PRN

## 2018-03-18 MED ORDER — SODIUM CHLORIDE 0.9 % IV SOLN
INTRAVENOUS | Status: DC | PRN
  Administered 2018-03-18: 10:00:00 via INTRAVENOUS

## 2018-03-18 MED ORDER — SUCCINYLCHOLINE CHLORIDE 200 MG/10ML IV SOSY
PREFILLED_SYRINGE | INTRAVENOUS | Status: DC | PRN
  Administered 2018-03-18: 120 mg via INTRAVENOUS

## 2018-03-18 NOTE — Procedures (Signed)
ECT SERVICES Physician's Interval Evaluation & Treatment Note  Patient Identification: [name] MRN:  [id number] Date of Evaluation:  03/18/2018 TX #: 1  MADRS: 22 MMSE: 30  P.E. Findings:  Vitals are now stable although he has had some periods of tachycardia.  Both of his legs are swollen with a rash on them probably from his previous clots.  Psychiatric Interval Note:  Patient is agitated confused babbling at times  Subjective:  Patient is a 72 y.o. male seen for evaluation for Electroconvulsive Therapy. Very little insight  Treatment Summary:   [x]   Right Unilateral             []  Bilateral   % Energy : 0.3 ms 100%   Impedance: 560 ohms  Seizure Energy Index: 1410 V squared  Postictal Suppression Index: 30%  Seizure Concordance Index: 98%  Medications  Pre Shock: Brevital 80 mg succinylcholine 100 mg  Post Shock:    Seizure Duration: 17 seconds by EMG 35 seconds by EEG   Comments: Plan to continue through into next week to get stabilization and improvement  Lungs:  [x]   Clear to auscultation               []  Other:   Heart:    [x]   Regular rhythm             []  irregular rhythm    [x]   Previous H&P reviewed, patient examined and there are NO CHANGES                 []   Previous H&P reviewed, patient examined and there are changes noted.   Mordecai RasmussenJohn Sinda Leedom, MD 4/26/201911:27 AM

## 2018-03-18 NOTE — Progress Notes (Addendum)
Recreation Therapy Notes  INPATIENT RECREATION THERAPY ASSESSMENT  Patient Details Name: Suezanne JacquetMarvin D Vandivier MRN: [id number] DOB: December 30, 1945 Today's Date: 03/18/2018  Patient could not be waken for assessment.       Information Obtained From:    Able to Participate in Assessment/Interview:    Patient Presentation:    Reason for Admission (Per Patient):    Patient Stressors:    Coping Skills:      Leisure Interests (2+):     Frequency of Recreation/Participation:    Awareness of Community Resources:     [employer] Resources:     Current Use:    If no, Barriers?:    Expressed Interest in State Street CorporationCommunity Resource Information:    [place]     Patient Main Form of Transportation:    Patient Strengths:     Patient Identified Areas of Improvement:     Patient Goal for Hospitalization:     Current SI (including self-harm):     Current HI:     Current AVH:    Staff Intervention Plan:    Consent to Intern Participation:    Anwen Cannedy 03/18/2018, 12:27 PM

## 2018-03-18 NOTE — [facility] Suicide Risk Assessment (Signed)
[facility] INPATIENT:  Family/Significant Other Suicide Prevention Education  Suicide Prevention Education:  Education Completed; Clide CliffMargaret Williams, sister at [phone]  has been identified by the patient as the family member/significant who will aid the patient in the event of a mental health crisis (suicidal ideations/suicide attempt).  With written consent from the patient, the family member/significant other has been provided the following suicide prevention education, prior to the and/or following the discharge of the patient.  The suicide prevention education provided includes the following:  Suicide risk factors  Suicide prevention and interventions  National Suicide Hotline telephone number  [facility] assessment telephone number  [facility] Emergency Assistance 911  [facility] and/or Residential Mobile Crisis Unit telephone number  Request made of family/significant other to:  Remove weapons (e.g., guns, rifles, knives), all items previously/currently identified as safety concern.    Remove drugs/medications (over-the-counter, prescriptions, illicit drugs), all items previously/currently identified as a safety concern.  The family member/significant other verbalizes understanding of the suicide prevention education information provided.  The family member/significant other agrees to remove the items of safety concern listed above.  Alease FrameSonya S Cashay Manganelli, LCSW 03/18/2018, 3:05 PM
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
## 2018-03-18 NOTE — Anesthesia Postprocedure Evaluation (Signed)
Anesthesia Post Note  Patient: Kyle [name]  Procedure(s) Performed: ECT TX  Patient location during evaluation: PACU Anesthesia Type: General Level of consciousness: awake and alert Pain management: pain level controlled Vital Signs Assessment: post-procedure vital signs reviewed and stable Respiratory status: spontaneous breathing, nonlabored ventilation, respiratory function stable and patient connected to nasal cannula oxygen Cardiovascular status: blood pressure returned to baseline and stable Postop Assessment: no apparent nausea or vomiting Anesthetic complications: no     Last Vitals:  Vitals:   03/18/18 1050 03/18/18 1100  BP: (!) 117/57 (!) 121/58  Pulse: 65 66  Resp: 20 (!) 21  Temp:  36.8 C  SpO2: 100% 98%    Last Pain:  Vitals:   03/18/18 1110  TempSrc:   PainSc: 0-No pain                 Cleda MccreedyJoseph K Wilma Wuthrich

## 2018-03-18 NOTE — Progress Notes (Deleted)
Recreation Therapy Notes   Date: 03/18/2018  Time: 9:30 am   Location: Craft Room   Behavioral response: N/A   Intervention Topic: Emotions  Discussion/Intervention: Patient did not attend group.   Clinical Observations/Feedback:  Patient did not attend group.   Marquavis Hannen LRT/CTRS         Joeli Fenner 03/18/2018 10:27 AM

## 2018-03-18 NOTE — Transfer of Care (Signed)
Immediate Anesthesia Transfer of Care Note  Patient: Kyle [name]  Procedure(s) Performed: ECT TX  Patient Location: PACU  Anesthesia Type:General  Level of Consciousness: sedated  Airway & Oxygen Therapy: Patient Spontanous Breathing and Patient connected to face mask oxygen  Post-op Assessment: Report given to RN and Post -op Vital signs reviewed and stable  Post vital signs: Reviewed and stable  Last Vitals:  Vitals Value Taken Time  BP 129/63 03/18/2018 10:39 AM  Temp    Pulse 63 03/18/2018 10:40 AM  Resp 16 03/18/2018 10:40 AM  SpO2 100 % 03/18/2018 10:40 AM  Vitals shown include unvalidated device data.  Last Pain:  Vitals:   03/18/18 0910  TempSrc: Oral  PainSc:          Complications: No apparent anesthesia complications
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
## 2018-03-18 NOTE — Anesthesia Post-op Follow-up Note (Signed)
Anesthesia QCDR form completed.        

## 2018-03-18 NOTE — Progress Notes (Signed)
Recreation Therapy Notes   Date: 03/18/2018  Time: 9:30 am   Location: Craft Room   Behavioral response: N/A   Intervention Topic: Emotions  Discussion/Intervention: Patient did not attend group.   Clinical Observations/Feedback:  Patient did not attend group.   Pauline Trainer LRT/CTRS         Kyle [name] 03/18/2018 10:26 AM 

## 2018-03-18 NOTE — Tx Team (Addendum)
Interdisciplinary Treatment and Diagnostic Plan Update  03/18/2018 Time of Session: 10:40 AM Kyle [name] MRN: [id number]  Principal Diagnosis: Bipolar I disorder, current or most recent episode manic, with psychotic features (HCC)  Secondary Diagnoses: Principal Problem:   Bipolar I disorder, current or most recent episode manic, with psychotic features (HCC) Active Problems:   Essential tremor   Current use of anticoagulant therapy   History of pulmonary embolism   BPH (benign prostatic hyperplasia)   Dyslipidemia   Current Medications:  Current Facility-Administered Medications  Medication Dose Route Frequency Provider Last Rate Last Dose  . acetaminophen (TYLENOL) tablet 650 mg  650 mg Oral Q6H PRN Clapacs, John T, MD      . alum & mag hydroxide-simeth (MAALOX/MYLANTA) 200-200-20 MG/5ML suspension 30 mL  30 mL Oral Q4H PRN Clapacs, John T, MD      . aspirin EC tablet 81 mg  81 mg Oral Daily Clapacs, Jackquline Denmark, MD   81 mg at 03/18/18 1206  . divalproex (DEPAKOTE) DR tablet 500 mg  500 mg Oral Q12H Clapacs, Jackquline Denmark, MD   500 mg at 03/18/18 1206  . fentaNYL (SUBLIMAZE) injection 25 mcg  25 mcg Intravenous Q5 min PRN Yves Dill, MD      . finasteride (PROSCAR) tablet 5 mg  5 mg Oral Daily Clapacs, Jackquline Denmark, MD   5 mg at 03/18/18 1207  . FLUoxetine (PROZAC) capsule 20 mg  20 mg Oral Daily Clapacs, John T, MD   20 mg at 03/18/18 1206  . hydrOXYzine (ATARAX/VISTARIL) tablet 50 mg  50 mg Oral TID PRN Clapacs, John T, MD      . magnesium hydroxide (MILK OF MAGNESIA) suspension 30 mL  30 mL Oral Daily PRN Clapacs, John T, MD      . OLANZapine (ZYPREXA) tablet 15 mg  15 mg Oral QHS Clapacs, Jackquline Denmark, MD   15 mg at 03/17/18 2130  . ondansetron (ZOFRAN) injection 4 mg  4 mg Intravenous Once PRN Yves Dill, MD      . propranolol ER (INDERAL LA) 24 hr capsule 60 mg  60 mg Oral Daily Clapacs, Jackquline Denmark, MD   60 mg at 03/18/18 0815  . rivaroxaban (XARELTO) tablet 20 mg  20 mg Oral Q supper Clapacs,  Jackquline Denmark, MD   20 mg at 03/17/18 1628  . simvastatin (ZOCOR) tablet 10 mg  10 mg Oral q1800 Clapacs, Jackquline Denmark, MD   10 mg at 03/17/18 1749  . temazepam (RESTORIL) capsule 30 mg  30 mg Oral QHS Pucilowska, Jolanta B, MD   30 mg at 03/17/18 2130   PTA Medications: Medications Prior to Admission  Medication Sig Dispense Refill Last Dose  . acetaminophen (TYLENOL) 650 MG CR tablet Take 650 mg by mouth every 8 (eight) hours as needed for pain.   03/15/2018 at Unknown time  . finasteride (PROSCAR) 5 MG tablet Take 1 tablet (5 mg total) by mouth daily. 30 tablet 0 03/15/2018 at Unknown time  . FLUoxetine (PROZAC) 20 MG capsule Take 1 capsule (20 mg total) by mouth daily. 30 capsule 0 03/15/2018 at Unknown time  . GNP ASPIRIN LOW DOSE 81 MG EC tablet TAKE 1 TABLET BY MOUTH ONCE DAILY. 30 tablet 0 03/15/2018 at Unknown time  . OLANZapine (ZYPREXA) 15 MG tablet Take 1 tablet (15 mg total) by mouth at bedtime. 30 tablet 0 03/15/2018 at Unknown time  . propranolol ER (INDERAL LA) 60 MG 24 hr capsule Take 1 capsule (60 mg total)  by mouth daily. 30 capsule 0 03/15/2018 at Unknown time  . QUEtiapine (SEROQUEL) 400 MG tablet Take 1 tablet (400 mg total) by mouth at bedtime. 30 tablet 1 03/15/2018 at Unknown time  . metoprolol tartrate (LOPRESSOR) 25 MG tablet TAKE 1 TABLET BY MOUTH TWICE DAILY (Patient not taking: Reported on 03/16/2018) 60 tablet 0 Not Taking at Unknown time  . simvastatin (ZOCOR) 10 MG tablet Take 1 tablet (10 mg total) by mouth daily at 6 PM. (Patient not taking: Reported on 07/21/2017) 30 tablet 0 Not Taking at Unknown time  . temazepam (RESTORIL) 15 MG capsule Take 1 capsule (15 mg total) by mouth at bedtime. (Patient not taking: Reported on 06/23/2017) 30 capsule 0 Not Taking at Unknown time    Patient Stressors: Loss of mother Medication change or noncompliance  Patient Strengths: Active sense of humor Average or above average intelligence Supportive family/friends  Treatment Modalities:  Medication Management, Group therapy, Case management,  1 to 1 session with clinician, Psychoeducation, Recreational therapy.   Physician Treatment Plan for Primary Diagnosis: Bipolar I disorder, current or most recent episode manic, with psychotic features (HCC) Long Term Goal(s): Improvement in symptoms so as ready for discharge NA   Short Term Goals: Ability to identify changes in lifestyle to reduce recurrence of condition will improve Ability to verbalize feelings will improve Ability to disclose and discuss suicidal ideas Ability to demonstrate self-control will improve Ability to identify and develop effective coping behaviors will improve Ability to maintain clinical measurements within normal limits will improve Compliance with prescribed medications will improve Ability to identify triggers associated with substance abuse/mental health issues will improve NA  Medication Management: Evaluate patient's response, side effects, and tolerance of medication regimen.  Therapeutic Interventions: 1 to 1 sessions, Unit Group sessions and Medication administration.  Evaluation of Outcomes: Progressing  Physician Treatment Plan for Secondary Diagnosis: Principal Problem:   Bipolar I disorder, current or most recent episode manic, with psychotic features (HCC) Active Problems:   Essential tremor   Current use of anticoagulant therapy   History of pulmonary embolism   BPH (benign prostatic hyperplasia)   Dyslipidemia  Long Term Goal(s): Improvement in symptoms so as ready for discharge NA   Short Term Goals: Ability to identify changes in lifestyle to reduce recurrence of condition will improve Ability to verbalize feelings will improve Ability to disclose and discuss suicidal ideas Ability to demonstrate self-control will improve Ability to identify and develop effective coping behaviors will improve Ability to maintain clinical measurements within normal limits will  improve Compliance with prescribed medications will improve Ability to identify triggers associated with substance abuse/mental health issues will improve NA     Medication Management: Evaluate patient's response, side effects, and tolerance of medication regimen.  Therapeutic Interventions: 1 to 1 sessions, Unit Group sessions and Medication administration.  Evaluation of Outcomes: Progressing   RN Treatment Plan for Primary Diagnosis: Bipolar I disorder, current or most recent episode manic, with psychotic features (HCC) Long Term Goal(s): Knowledge of disease and therapeutic regimen to maintain health will improve  Short Term Goals: Ability to demonstrate self-control and Ability to identify and develop effective coping behaviors will improve  Medication Management: RN will administer medications as ordered by provider, will assess and evaluate patient's response and provide education to patient for prescribed medication. RN will report any adverse and/or side effects to prescribing provider.  Therapeutic Interventions: 1 on 1 counseling sessions, Psychoeducation, Medication administration, Evaluate responses to treatment, Monitor vital signs and CBGs as  ordered, Perform/monitor CIWA, COWS, AIMS and Fall Risk screenings as ordered, Perform wound care treatments as ordered.  Evaluation of Outcomes: Progressing   LCSW Treatment Plan for Primary Diagnosis: Bipolar I disorder, current or most recent episode manic, with psychotic features (HCC) Long Term Goal(s): Safe transition to appropriate next level of care at discharge, Engage patient in therapeutic group addressing interpersonal concerns.  Short Term Goals: Engage patient in aftercare planning with referrals and resources and Increase skills for wellness and recovery  Therapeutic Interventions: Assess for all discharge needs, 1 to 1 time with Social worker, Explore available resources and support systems, Assess for adequacy in  community support network, Educate family and significant other(s) on suicide prevention, Complete Psychosocial Assessment, Interpersonal group therapy.  Evaluation of Outcomes: Progressing   Progress in Treatment: Attending groups: Yes. Participating in groups: Yes. Taking medication as prescribed: Yes. Toleration medication: Yes. Family/Significant other contact made: No, will contact:  CSW will contact identified support person. Patient understands diagnosis: Yes. Discussing patient identified problems/goals with staff: Yes. Medical problems stabilized or resolved: Yes. Denies suicidal/homicidal ideation: Yes. Issues/concerns per patient self-inventory: No. Other: n/a  New problem(s) identified: No, Describe:  No new problems identified  New Short Term/Long Term Goal(s): "I think that I am doing better and am ready to go home.  I have a lot of things to take care of".  Discharge Plan or Barriers: Pt will return back to hotel with [facility].  He may continue with maintenance ECT.  Reason for Continuation of Hospitalization: Delusions  Depression Medication stabilization  Estimated Length of Stay: 5-7 days  Recreational Therapy: Patient Stressors: N/A  Patient Goal: Patient will engage in groups without prompting or encouragement from LRT x3 group sessions within 5 recreation therapy group sessions  Attendees: Patient: 03/18/2018 2:43 PM  Physician: Mordecai RasmussenJohn Clapacs, MD 03/18/2018 2:43 PM  Nursing: Leonia ReaderPhyllis Cobb, RN 03/18/2018 2:43 PM  RN Care Manager: 03/18/2018 2:43 PM  Social Worker: Huey RomansSonya Carter, LCSW 03/18/2018 2:43 PM  Recreational Therapist: Garret ReddishShay Annelisa Ryback, LRT 03/18/2018 2:43 PM  Other: Johny Shearsassandra Jarrett, LCSWA 03/18/2018 2:43 PM  Other:  03/18/2018 2:43 PM  Other: 03/18/2018 2:43 PM    Scribe for Treatment Team: Alease FrameSonya S Carter, LCSW 03/18/2018 2:43 PM

## 2018-03-18 NOTE — Progress Notes (Signed)
Pt back from ECT. Pt is alert and oriented x4. Pt stated he was in no pain at this time. Will continue to monitor.
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
## 2018-03-18 NOTE — Anesthesia Procedure Notes (Signed)
Performed by: Roshunda Keir, CRNA Pre-anesthesia Checklist: Patient identified, Emergency Drugs available, Suction available and Patient being monitored Patient Re-evaluated:Patient Re-evaluated prior to induction Oxygen Delivery Method: Circle system utilized Preoxygenation: Pre-oxygenation with 100% oxygen Induction Type: IV induction Ventilation: Mask ventilation without difficulty and Mask ventilation throughout procedure Airway Equipment and Method: Bite block Placement Confirmation: positive ETCO2 Dental Injury: Teeth and Oropharynx as per pre-operative assessment        

## 2018-03-18 NOTE — [facility] Group Notes (Signed)
03/18/2018 1PM  Type of Therapy and Topic:  Group Therapy:  Feelings around Relapse and Recovery  Participation Level:  Active   Description of Group:    Patients in this group will discuss emotions they experience before and after a relapse. They will process how experiencing these feelings, or avoidance of experiencing them, relates to having a relapse. Facilitator will guide patients to explore emotions they have related to recovery. Patients will be encouraged to process which emotions are more powerful. They will be guided to discuss the emotional reaction significant others in their lives may have to patients' relapse or recovery. Patients will be assisted in exploring ways to respond to the emotions of others without this contributing to a relapse.  Therapeutic Goals: 1. Patient will identify two or more emotions that lead to a relapse for them 2. Patient will identify two emotions that result when they relapse 3. Patient will identify two emotions related to recovery 4. Patient will demonstrate ability to communicate their needs through discussion and/or role plays   Summary of Patient Progress: Actively and appropriately engaged in the group. Patient was able to provide support and validation to other group members.Patient practiced active listening when interacting with the facilitator and other group members. Patient was able to identify his support system of God, family and his friends. Patient in still in the process of obtaining treatment goals.     Therapeutic Modalities:   Cognitive Behavioral Therapy Solution-Focused Therapy Assertiveness Training Relapse Prevention Therapy   Kyle ShearsCassandra  Amaria Mundorf, Alexander MtLCSW 03/18/2018 2:44 PM

## 2018-03-19 NOTE — Progress Notes (Signed)
Pt was engaging with peers. Pt verbalized "he would like to be called Kyle [name]". Pt is medication complaint. Pt has been responding to internal stimuli. No behavioral issues noted/reported. Remains on  Q 15 mins safety rounds. Will cont to monitor pt.

## 2018-03-19 NOTE — [facility] Group Notes (Signed)
LCSW Group Therapy Note  03/19/2018 1:15pm  Type of Therapy and Topic:  Group Therapy:  Cognitive Distortions  Participation Level:  Did Not Attend   Description of Group:    Patients in this group will be introduced to the topic of cognitive distortions.  Patients will identify and describe cognitive distortions, describe the feelings these distortions create for them.  Patients will identify one or more situations in their personal life where they have cognitively distorted thinking and will verbalize challenging this cognitive distortion through positive thinking skills.  Patients will practice the skill of using positive affirmations to challenge cognitive distortions using affirmation cards.    Therapeutic Goals:  1. Patient will identify two or more cognitive distortions they have used 2. Patient will identify one or more emotions that stem from use of a cognitive distortion 3. Patient will demonstrate use of a positive affirmation to counter a cognitive distortion through discussion and/or role play. 4. Patient will describe one way cognitive distortions can be detrimental to wellness   Summary of Patient Progress: The patient reported he feels "wiser" today. Patients were introduced to the topic of cognitive distortions.  Patient was able to identify and describe cognitive distortions. He described the feelings these distortions create for him.  Patient identified situations in his personal life where he has cognitively distorted thinking and will verbalized challenging this cognitive distortion through positive thinking skills.  Patients practiced the skill of using positive affirmations to challenge cognitive distortions using affirmation cards. Patient actively engaged in group discussion.      Therapeutic Modalities:   Cognitive Behavioral Therapy Motivational Interviewing   Caterina Racine  CUEBAS-COLON, LCSW 03/19/2018 11:35 AM
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
## 2018-03-19 NOTE — Plan of Care (Signed)
Pt alert and oriented to self, place and year. Pt is disorganized and confused at times and is hard to understand. Pt compliant with medications. Pt has a rash that is irritated above his inner ankles on both legs. Which were bleeding from patient scratching at them. Encourage patient to leave socks off while in bed. Will continue to monitor.

## 2018-03-19 NOTE — Plan of Care (Signed)
Pt had some visitors this afternoon he claimed it was his parents; however they said he was lying. Pt very flirtatious and babbles unrealistic things. Pt is medication compliant. No s/sx of distress. Will cont to monitor pt.  Problem: Activity: Goal: Will verbalize the importance of balancing activity with adequate rest periods Outcome: Progressing   Problem: Education: Goal: Will be free of psychotic symptoms Outcome: Progressing Goal: Knowledge of the prescribed therapeutic regimen will improve Outcome: Progressing   Problem: Coping: Goal: Coping ability will improve Outcome: Progressing Goal: Will verbalize feelings Outcome: Progressing   Problem: Health Behavior/Discharge Planning: Goal: Compliance with prescribed medication regimen will improve Outcome: Progressing   Problem: Nutritional: Goal: Ability to achieve adequate nutritional intake will improve Outcome: Progressing   Problem: Role Relationship: Goal: Ability to communicate needs accurately will improve Outcome: Progressing Goal: Ability to interact with others will improve Outcome: Progressing   Problem: Safety: Goal: Ability to redirect hostility and anger into socially appropriate behaviors will improve Outcome: Progressing Goal: Ability to remain free from injury will improve Outcome: Progressing   Problem: Self-Care: Goal: Ability to participate in self-care as condition permits will improve Outcome: Progressing   Problem: Self-Concept: Goal: Will verbalize positive feelings about self Outcome: Progressing   Problem: Education: Goal: Knowledge of [place] General Education information/materials will improve Outcome: Progressing Goal: Emotional status will improve Outcome: Progressing Goal: Mental status will improve Outcome: Progressing   Problem: Spiritual Needs Goal: Ability to function at adequate level Outcome: Progressing   Problem: Education: Goal: Knowledge of General Education  information will improve Outcome: Progressing   Problem: Health Behavior/Discharge Planning: Goal: Ability to manage health-related needs will improve Outcome: Progressing   Problem: Clinical Measurements: Goal: Ability to maintain clinical measurements within normal limits will improve Outcome: Progressing Goal: Will remain free from infection Outcome: Progressing Goal: Diagnostic test results will improve Outcome: Progressing Goal: Respiratory complications will improve Outcome: Progressing Goal: Cardiovascular complication will be avoided Outcome: Progressing   Problem: Activity: Goal: Risk for activity intolerance will decrease Outcome: Progressing   Problem: Nutrition: Goal: Adequate nutrition will be maintained Outcome: Progressing   Problem: Coping: Goal: Level of anxiety will decrease Outcome: Progressing   Problem: Elimination: Goal: Will not experience complications related to bowel motility Outcome: Progressing Goal: Will not experience complications related to urinary retention Outcome: Progressing   Problem: Pain Managment: Goal: General experience of comfort will improve Outcome: Progressing   Problem: Safety: Goal: Ability to remain free from injury will improve Outcome: Progressing   Problem: Skin Integrity: Goal: Risk for impaired skin integrity will decrease Outcome: Progressing

## 2018-03-19 NOTE — Plan of Care (Signed)
  Problem: Activity: Goal: Will verbalize the importance of balancing activity with adequate rest periods Outcome: Progressing   Problem: Education: Goal: Will be free of psychotic symptoms Outcome: Progressing Goal: Knowledge of the prescribed therapeutic regimen will improve Outcome: Progressing   Problem: Coping: Goal: Coping ability will improve Outcome: Progressing Goal: Will verbalize feelings Outcome: Progressing   Problem: Health Behavior/Discharge Planning: Goal: Compliance with prescribed medication regimen will improve Outcome: Progressing   Problem: Nutritional: Goal: Ability to achieve adequate nutritional intake will improve Outcome: Progressing   Problem: Role Relationship: Goal: Ability to communicate needs accurately will improve Outcome: Progressing Goal: Ability to interact with others will improve Outcome: Progressing   Problem: Safety: Goal: Ability to redirect hostility and anger into socially appropriate behaviors will improve Outcome: Progressing Goal: Ability to remain free from injury will improve Outcome: Progressing   Problem: Self-Care: Goal: Ability to participate in self-care as condition permits will improve Outcome: Progressing   Problem: Self-Concept: Goal: Will verbalize positive feelings about self Outcome: Progressing   Problem: Education: Goal: Knowledge of [place] [facility] Education information/materials will improve Outcome: Progressing Goal: Emotional status will improve Outcome: Progressing Goal: Mental status will improve Outcome: Progressing   Problem: Spiritual Needs Goal: Ability to function at adequate level Outcome: Progressing

## 2018-03-20 NOTE — [facility] Group Notes (Signed)
LCSW Group Therapy Note 03/20/2018 1:15pm  Type of Therapy and Topic: Group Therapy: Feelings Around Returning Home & Establishing a Supportive Framework and Supporting Oneself When Supports Not Available  Participation Level: Active  Description of Group:  Patients first processed thoughts and feelings about upcoming discharge. These included fears of upcoming changes, lack of change, new living environments, judgements and expectations from others and overall stigma of mental health issues. The group then discussed the definition of a supportive framework, what that looks and feels like, and how do to discern it from an unhealthy non-supportive network. The group identified different types of supports as well as what to do when your family/friends are less than helpful or unavailable  Therapeutic Goals  1. Patient will identify one healthy supportive network that they can use at discharge. 2. Patient will identify one factor of a supportive framework and how to tell it from an unhealthy network. 3. Patient able to identify one coping skill to use when they do not have positive supports from others. 4. Patient will demonstrate ability to communicate their needs through discussion and/or role plays.  Summary of Patient Progress:  The patient reported he feels "tired." Pt engaged during group session. As patients processed their anxiety about discharge and described healthy supports patient shared "I don't like to stay locked up, I feel better." Patients identified at least one self-care tool they were willing to use after discharge; watch the sun come up.   Therapeutic Modalities Cognitive Behavioral Therapy Motivational Interviewing   Johanna Stafford  CUEBAS-COLON, LCSW 03/20/2018 12:08 PM

## 2018-03-20 NOTE — Progress Notes (Signed)
D:Pt denies SI/HI/AVH. Reports he can remain safe while on the unit. Pt. Contracts for safety. Pt. Observed eating a good portion of snack this evening in the milieu and eating reports good.  Pt. This evening continues to be observed with psychotic features as evidence by tangential, delusional, and pressured speech. Pt. Appears paranoid about our conversations when discussing plan of care set forth by treatment team, causing frustrations/irritability. Pt. Continues to not talk in logical/coherent manor frequently and has some observed irritability about generalized complaints.    A: Q x 15 minute observation checks were completed for safety. Patient was provided with education. Patient was given scheduled medications. Patient  was encourage to attend groups, participate in unit activities and continue with plan of care. Pt. Attends groups.  R:Patient is complaint with medication and unit procedures.             Precautionary checks every 15 minutes for safety maintained, room free of safety hazards, patient sustains no injury or falls during this shift.

## 2018-03-20 NOTE — Progress Notes (Signed)
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
## 2018-03-20 NOTE — Plan of Care (Signed)
Patient was appropriate and complaint with medication and treatment.   Problem: Education: Goal: Knowledge of the prescribed therapeutic regimen will improve Outcome: Progressing   Problem: Health Behavior/Discharge Planning: Goal: Compliance with prescribed medication regimen will improve Outcome: Progressing   Problem: Role Relationship: Goal: Ability to communicate needs accurately will improve Outcome: Progressing

## 2018-03-20 NOTE — Plan of Care (Signed)
Pt. Compliant with medications this evening. Pt. Observed eating a good portion of snack this evening in the milieu and eating reports good. Pt. Denies SI/HI and reports he can remain safe while on the unit. Pt. Contracts for safety. Pt. This evening continues to be observed with psychotic features as evidence by tangential, delusional, and pressured speech. Pt. Appears paranoid about our conversations when discussing plan of care set forth my treatment team, causing frustrations/irritability. Pt. Continues to not talk in logical/coherent manor frequently and has some observed irritability about generalized complaints.    Problem: Education: Goal: Will be free of psychotic symptoms Outcome: Not Progressing   Problem: Health Behavior/Discharge Planning: Goal: Compliance with prescribed medication regimen will improve Outcome: Progressing   Problem: Nutritional: Goal: Ability to achieve adequate nutritional intake will improve Outcome: Progressing   Problem: Safety: Goal: Ability to remain free from injury will improve Outcome: Progressing   Problem: Safety: Goal: Ability to remain free from injury will improve Outcome: Progressing

## 2018-03-20 NOTE — Progress Notes (Signed)
Patient ID: Kyle [name], male   DOB: [birth year], 73 y.o.   MRN: [id number] Per State regulations 482.30 this chart was reviewed for medical necessity with respect to the patient's admission/duration of stay.    Next review date: 03/24/18  Thurman Coyer, BSN, RN-BC  Case Manager

## 2018-03-21 ENCOUNTER — Inpatient Hospital Stay: Payer: Medicare Other

## 2018-03-21 ENCOUNTER — Other Ambulatory Visit: Payer: Self-pay | Admitting: Psychiatry

## 2018-03-21 ENCOUNTER — Encounter: Payer: Self-pay | Admitting: Anesthesiology

## 2018-03-21 MED ORDER — AMMONIUM LACTATE 12 % EX LOTN
TOPICAL_LOTION | Freq: Two times a day (BID) | CUTANEOUS | Status: DC
Start: 2018-03-21 — End: 2018-03-25
  Administered 2018-03-21: 15:00:00 via TOPICAL
  Administered 2018-03-22: 1 via TOPICAL
  Administered 2018-03-22: 22:00:00 via TOPICAL
  Administered 2018-03-23: 1 via TOPICAL
  Administered 2018-03-23 – 2018-03-24 (×3): via TOPICAL
  Filled 2018-03-21: qty 400

## 2018-03-21 MED ORDER — SODIUM CHLORIDE 0.9 % IV SOLN
500.0000 mL | Freq: Once | INTRAVENOUS | Status: AC
  Administered 2018-03-23: 10:00:00 via INTRAVENOUS

## 2018-03-21 NOTE — Progress Notes (Addendum)
Recreation Therapy Notes  Date: 03/21/2018  Time: 9:30 am  Location: Craft Room  Behavioral response: Appropriate  Intervention Topic: Coping Skills  Discussion/Intervention:  Group content on today was focused on coping skills. The group defined what coping skills are and when they can be used. Individuals described how they normally cope with thing and the coping skills they normally use. Patients expressed why it is important to cope with things and how not coping with things can affect you. The group participated in the intervention "My coping box" and made coping boxes while adding coping skills they could use in the future to the box. Clinical Observations/Feedback:  Patient came to group and was focused on what his peers and staff had to say about coping skills. Individual participated in the intervention and was social with peers and staff during group.  Kyle [name] LRT/CTRS          Hamda Klutts 03/21/2018 12:09 PM

## 2018-03-21 NOTE — Consult Note (Signed)
WOC Nurse wound consult note Reason for Consult:Bilateral lower legs with chronic skin changes, abrasions and dry skin.  Will treat with topical emollient Wound type:abrasions Pressure Injury POA: NA Wound WUJ:[id number] lesions Drainage (amount, consistency, odor) none noted Periwound:dry skin chronic skin changes to bilateral lower legs Dressing procedure/placement/frequency:Amlactin cream daily.  Will not follow at this time.  Please re-consult if needed.  Maple Hudson RN BSN CWON Pager [phone]
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
## 2018-03-21 NOTE — Plan of Care (Signed)
Patient calm and cooperative in the unit.Visible in the milieu.As an answer to how was patients day patient started talking a lot which did not make much sense.States "need to separate the good from the evil.I can not stand the evil."Compliant with medications.Attended groups.Safety maintained with 15 min checks.

## 2018-03-22 LAB — VALPROIC ACID LEVEL: Valproic Acid Lvl: 59 ug/mL (ref 50.0–100.0)
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
## 2018-03-22 NOTE — Plan of Care (Signed)
Attending unit programing . Conversation logical  to related  Subject  discussed.

## 2018-03-22 NOTE — Progress Notes (Signed)
Recreation Therapy Notes   Date: 03/22/2018  Time: 9:30 am  Location: Craft Room  Behavioral response: Appropriate  Intervention Topic: Communication  Discussion/Intervention:  Group content today was focused on communication. The group defined communication and ways to communicate with others. Individuals stated reason why communication is important and some reasons to communicate with others. Patients expressed if they thought they were good at communicating with others and ways they could improve their communication skills. The group identified important parts of communication and some experiences they have had in the past with communication. The group participated in the intervention "What is that?", where they had a chance to test out their communication skills and identify ways to improve their communication techniques.  Clinical Observations/Feedback:  Patient came to group and stated that communication is important to him because it is a way for him get things out and not keep things bottled up. Individual was social with peers and staff while participating in group.  Vennesa Bastedo LRT/CTRS          Kyle [name] 03/22/2018 11:45 AM

## 2018-03-22 NOTE — Plan of Care (Signed)
Patient slept for Estimated Hours of 7.30; Precautionary checks every 15 minutes for safety maintained, room free of safety hazards, patient sustains no injury or falls during this shift.  Problem: Activity: Goal: Will verbalize the importance of balancing activity with adequate rest periods Outcome: Progressing   Problem: Education: Goal: Will be free of psychotic symptoms Outcome: Progressing Goal: Knowledge of the prescribed therapeutic regimen will improve Outcome: Progressing   Problem: Coping: Goal: Coping ability will improve Outcome: Progressing Goal: Will verbalize feelings Outcome: Progressing   Problem: Health Behavior/Discharge Planning: Goal: Compliance with prescribed medication regimen will improve Outcome: Progressing   Problem: Nutritional: Goal: Ability to achieve adequate nutritional intake will improve Outcome: Progressing   Problem: Role Relationship: Goal: Ability to communicate needs accurately will improve Outcome: Progressing Goal: Ability to interact with others will improve Outcome: Progressing   Problem: Safety: Goal: Ability to redirect hostility and anger into socially appropriate behaviors will improve Outcome: Progressing Goal: Ability to remain free from injury will improve Outcome: Progressing   Problem: Self-Care: Goal: Ability to participate in self-care as condition permits will improve Outcome: Progressing   Problem: Self-Concept: Goal: Will verbalize positive feelings about self Outcome: Progressing   Problem: Education: Goal: Knowledge of [facility] Education information/materials will improve Outcome: Progressing Goal: Emotional status will improve Outcome: Progressing Goal: Mental status will improve Outcome: Progressing   Problem: Spiritual Needs Goal: Ability to function at adequate level Outcome: Progressing   Problem: Education: Goal: Knowledge of General Education information will improve Outcome:  Progressing   Problem: Health Behavior/Discharge Planning: Goal: Ability to manage health-related needs will improve Outcome: Progressing   Problem: Clinical Measurements: Goal: Ability to maintain clinical measurements within normal limits will improve Outcome: Progressing Goal: Will remain free from infection Outcome: Progressing Goal: Diagnostic test results will improve Outcome: Progressing Goal: Respiratory complications will improve Outcome: Progressing Goal: Cardiovascular complication will be avoided Outcome: Progressing   Problem: Activity: Goal: Risk for activity intolerance will decrease Outcome: Progressing   Problem: Nutrition: Goal: Adequate nutrition will be maintained Outcome: Progressing   Problem: Coping: Goal: Level of anxiety will decrease Outcome: Progressing   Problem: Elimination: Goal: Will not experience complications related to bowel motility Outcome: Progressing Goal: Will not experience complications related to urinary retention Outcome: Progressing   Problem: Pain Managment: Goal: General experience of comfort will improve Outcome: Progressing   Problem: Safety: Goal: Ability to remain free from injury will improve Outcome: Progressing   Problem: Skin Integrity: Goal: Risk for impaired skin integrity will decrease Outcome: Progressing

## 2018-03-22 NOTE — [facility] Group Notes (Signed)

## 2018-03-22 NOTE — Plan of Care (Signed)
Attending unit programing . Conversation logical  to related  Subject  discussed.   Problem: Activity: Goal: Will verbalize the importance of balancing activity with adequate rest periods 03/22/2018 1936 by Crist Infante, RN Outcome: Progressing 03/22/2018 1745 by Crist Infante, RN Outcome: Progressing   Problem: Education: Goal: Will be free of psychotic symptoms 03/22/2018 1936 by Crist Infante, RN Outcome: Progressing 03/22/2018 1745 by Crist Infante, RN Outcome: Progressing Goal: Knowledge of the prescribed therapeutic regimen will improve 03/22/2018 1936 by Crist Infante, RN Outcome: Progressing 03/22/2018 1745 by Crist Infante, RN Outcome: Progressing   Problem: Coping: Goal: Coping ability will improve 03/22/2018 1936 by Crist Infante, RN Outcome: Progressing 03/22/2018 1745 by Crist Infante, RN Outcome: Progressing Goal: Will verbalize feelings 03/22/2018 1936 by Crist Infante, RN Outcome: Progressing 03/22/2018 1745 by Crist Infante, RN Outcome: Progressing   Problem: Coping: Goal: Coping ability will improve 03/22/2018 1936 by Crist Infante, RN Outcome: Progressing 03/22/2018 1745 by Crist Infante, RN Outcome: Progressing Goal: Will verbalize feelings 03/22/2018 1936 by Crist Infante, RN Outcome: Progressing 03/22/2018 1745 by Crist Infante, RN Outcome: Progressing   Problem: Coping: Goal: Will verbalize feelings 03/22/2018 1936 by Crist Infante, RN Outcome: Progressing 03/22/2018 1745 by Crist Infante, RN Outcome: Progressing   Problem: Health Behavior/Discharge Planning: Goal: Compliance with prescribed medication regimen will improve 03/22/2018 1936 by Crist Infante, RN Outcome: Progressing 03/22/2018 1745 by Crist Infante, RN Outcome: Progressing   Problem: Nutritional: Goal: Ability to achieve adequate nutritional intake will improve 03/22/2018 1936 by Crist Infante, RN Outcome: Progressing 03/22/2018 1745 by Crist Infante, RN Outcome: Progressing   Problem: Safety: Goal: Ability to redirect hostility and anger into socially appropriate behaviors will improve 03/22/2018 1936 by Crist Infante, RN Outcome: Progressing 03/22/2018 1745 by Crist Infante, RN Outcome: Progressing Goal: Ability to remain free from injury will improve 03/22/2018 1936 by Crist Infante, RN Outcome: Progressing 03/22/2018 1745 by Crist Infante, RN Outcome: Progressing   Problem: Safety: Goal: Ability to remain free from injury will improve 03/22/2018 1936 by Crist Infante, RN Outcome: Progressing 03/22/2018 1745 by Crist Infante, RN Outcome: Progressing   Problem: Self-Care: Goal: Ability to participate in self-care as condition permits will improve 03/22/2018 1936 by Crist Infante, RN Outcome: Progressing 03/22/2018 1745 by Crist Infante, RN Outcome: Progressing   Problem: Self-Concept: Goal: Will verbalize positive feelings about self 03/22/2018 1936 by Crist Infante, RN Outcome: Progressing 03/22/2018 1745 by Crist Infante, RN Outcome: Progressing

## 2018-03-22 NOTE — Progress Notes (Signed)
Patient ID: Kyle [name], male   DOB: 1946-02-07, 72 y.o.   MRN: [id number] Pleasant on approach, bright, coherent, descent appearance, socializing well with peers and staffs, jovial, no odd and bizarre behaviors, denied pain, denied SI/HI/AVH.

## 2018-03-22 NOTE — [facility] Group Notes (Signed)
CSW Group Therapy Note  03/22/2018  Time:  0900  Type of Therapy and Topic: Group Therapy: Goals Group: SMART Goals    Participation Level:  Active    Description of Group:   The purpose of a daily goals group is to assist and guide patients in setting recovery/wellness-related goals. The objective is to set goals as they relate to the crisis in which they were admitted. Patients will be using SMART goal modalities to set measurable goals. Characteristics of realistic goals will be discussed and patients will be assisted in setting and processing how one will reach their goal. Facilitator will also assist patients in applying interventions and coping skills learned in psycho-education groups to the SMART goal and process how one will achieve defined goal.    Therapeutic Goals:  -Patients will develop and document one goal related to or their crisis in which brought them into treatment.  -Patients will be guided by LCSW using SMART goal setting modality in how to set a measurable, attainable, realistic and time sensitive goal.  -Patients will process barriers in reaching goal.  -Patients will process interventions in how to overcome and successful in reaching goal.    Patient's Goal:  Pt continues to work towards their tx goals but has not yet reached them. Pt was able to appropriately participate in group discussion, and was able to offer support/validation to other group members. Pt reported feeling, "fine, but I want my freedom back. I'm ready to go home." Pt reported his goal for the day is to, "discharge and write down at least three things I want to do after I get home."   Therapeutic Modalities:  Motivational Interviewing  Cognitive Behavioral Therapy  Crisis Intervention Model  SMART goals setting  Heidi Dach, MSW, LCSW Clinical Social Worker 03/22/2018 9:40 AM

## 2018-03-22 NOTE — Progress Notes (Signed)
D: Patient stated slept good last night .Stated appetite is good and energy level  Is normal. Stated concentration is good . Stated on Depression scale 0 , hopeless 0 and anxiety 0 .( low 0-10 high) Denies suicidal  homicidal ideations  .  No auditory hallucinations  No pain concerns . Appropriate ADL'S. Interacting with peers and staff.  Voice of wanting t to go home  A: Encourage patient participation with unit programming . Instruction  Given on  Medication , verbalize understanding. R: Voice no other concerns. Staff continue to monitor

## 2018-03-23 ENCOUNTER — Inpatient Hospital Stay: Payer: Medicare Other

## 2018-03-23 ENCOUNTER — Inpatient Hospital Stay: Payer: Medicare Other | Admitting: Anesthesiology

## 2018-03-23 ENCOUNTER — Other Ambulatory Visit: Payer: Self-pay | Admitting: Psychiatry

## 2018-03-23 LAB — GLUCOSE, CAPILLARY
Glucose-Capillary: 117 mg/dL — ABNORMAL HIGH (ref 65–99)
Glucose-Capillary: 110 mg/dL — ABNORMAL HIGH (ref 65–99)

## 2018-03-23 MED ORDER — METHOHEXITAL SODIUM 0.5 G IJ SOLR
INTRAMUSCULAR | Status: AC
  Filled 2018-03-23: qty 500

## 2018-03-23 MED ORDER — SODIUM CHLORIDE 0.9 % IV SOLN
500.0000 mL | Freq: Once | INTRAVENOUS | Status: AC
  Administered 2018-03-23: 500 mL via INTRAVENOUS

## 2018-03-23 MED ORDER — SUCCINYLCHOLINE CHLORIDE 200 MG/10ML IV SOSY
PREFILLED_SYRINGE | INTRAVENOUS | Status: DC | PRN
  Administered 2018-03-23: 120 mg via INTRAVENOUS

## 2018-03-23 MED ORDER — SUCCINYLCHOLINE CHLORIDE 20 MG/ML IJ SOLN
INTRAMUSCULAR | Status: AC
  Filled 2018-03-23: qty 1

## 2018-03-23 MED ORDER — ONDANSETRON HCL 4 MG/2ML IJ SOLN: 4.0000 mg | Freq: Once | INTRAMUSCULAR | Status: DC | PRN

## 2018-03-23 MED ORDER — METHOHEXITAL SODIUM 100 MG/10ML IV SOSY
PREFILLED_SYRINGE | INTRAVENOUS | Status: DC | PRN
  Administered 2018-03-23: 80 mg via INTRAVENOUS
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
## 2018-03-23 NOTE — Anesthesia Postprocedure Evaluation (Signed)
Anesthesia Post Note  Patient: Kyle [name]  Procedure(s) Performed: ECT TX  Patient location during evaluation: PICU Anesthesia Type: General Level of consciousness: awake and alert Pain management: pain level controlled Vital Signs Assessment: post-procedure vital signs reviewed and stable Respiratory status: spontaneous breathing and respiratory function stable Cardiovascular status: stable Anesthetic complications: no     Last Vitals:  Vitals:   03/23/18 0901 03/23/18 1130  BP: (!) 159/64 (!) 185/76  Pulse: 75 72  Resp:  (!) 23  Temp: 36.7 C 37.1 C  SpO2: 98% 98%    Last Pain:  Vitals:   03/23/18 1130  TempSrc:   PainSc: 0-No pain                 KEPHART,WILLIAM K

## 2018-03-23 NOTE — Anesthesia Post-op Follow-up Note (Signed)
Anesthesia QCDR form completed.        

## 2018-03-23 NOTE — Transfer of Care (Signed)
Immediate Anesthesia Transfer of Care Note  Patient: Kyle [name]  Procedure(s) Performed: ECT TX  Patient Location: PACU  Anesthesia Type:General  Level of Consciousness: sedated  Airway & Oxygen Therapy: Patient Spontanous Breathing and Patient connected to face mask oxygen  Post-op Assessment: Report given to RN and Post -op Vital signs reviewed and stable  Post vital signs: Reviewed and stable  Last Vitals:  Vitals Value Taken Time  BP 185/76 03/23/2018 11:30 AM  Temp 37.1 C 03/23/2018 11:30 AM  Pulse 70 03/23/2018 11:32 AM  Resp 24 03/23/2018 11:32 AM  SpO2 95 % 03/23/2018 11:32 AM  Vitals shown include unvalidated device data.  Last Pain:  Vitals:   03/23/18 1130  TempSrc:   PainSc: 0-No pain         Complications: No apparent anesthesia complications

## 2018-03-23 NOTE — Progress Notes (Signed)
Patient slept 7.45  hours. No issues. 

## 2018-03-23 NOTE — Anesthesia Procedure Notes (Addendum)
Date/Time: 03/23/2018 11:18 AM Performed by: Lily Kocher, CRNA Pre-anesthesia Checklist: Patient identified, Emergency Drugs available, Suction available and Patient being monitored Patient Re-evaluated:Patient Re-evaluated prior to induction Oxygen Delivery Method: Circle system utilized Preoxygenation: Pre-oxygenation with 100% oxygen Induction Type: IV induction Ventilation: Mask ventilation with difficulty and Two handed mask ventilation required Airway Equipment and Method: Bite block Placement Confirmation: positive ETCO2 Dental Injury: Teeth and Oropharynx as per pre-operative assessment

## 2018-03-23 NOTE — [facility] Group Notes (Signed)
LCSW Group Therapy Note  03/23/2018 1:00 PM  Type of Therapy/Topic:  Group Therapy:  Emotion Regulation  Participation Level:  Did Not Attend   Description of Group:    The purpose of this group is to assist patients in learning to regulate negative emotions and experience positive emotions. Patients will be guided to discuss ways in which they have been vulnerable to their negative emotions. These vulnerabilities will be juxtaposed with experiences of positive emotions or situations, and patients will be challenged to use positive emotions to combat negative ones. Special emphasis will be placed on coping with negative emotions in conflict situations, and patients will process healthy conflict resolution skills.  Therapeutic Goals: 1. Patient will identify two positive emotions or experiences to reflect on in order to balance out negative emotions 2. Patient will label two or more emotions that they find the most difficult to experience 3. Patient will demonstrate positive conflict resolution skills through discussion and/or role plays  Summary of Patient Progress:  Kyle [name] was invited to today's group, but chose not to attend.     Therapeutic Modalities:   Cognitive Behavioral Therapy Feelings Identification Dialectical Behavioral Therapy

## 2018-03-23 NOTE — Tx Team (Addendum)
Interdisciplinary Treatment and Diagnostic Plan Update  03/23/2018 Time of Session: 10:30 AM Kyle [name] [facility] MRN: [id number]  Principal Diagnosis: Bipolar I disorder, current or most recent episode manic, with psychotic features (HCC)  Secondary Diagnoses: Principal Problem:   Bipolar I disorder, current or most recent episode manic, with psychotic features (HCC) Active Problems:   Essential tremor   Current use of anticoagulant therapy   History of pulmonary embolism   BPH (benign prostatic hyperplasia)   Dyslipidemia   Current Medications:  Current Facility-Administered Medications  Medication Dose Route Frequency Provider Last Rate Last Dose  . acetaminophen (TYLENOL) tablet 650 mg  650 mg Oral Q6H PRN Clapacs, John T, MD      . alum & mag hydroxide-simeth (MAALOX/MYLANTA) 200-200-20 MG/5ML suspension 30 mL  30 mL Oral Q4H PRN Clapacs, John T, MD      . ammonium lactate (LAC-HYDRIN) 12 % lotion   Topical BID Clapacs, Jackquline Denmark, MD   1 application at 03/23/18 [phone]  . aspirin EC tablet 81 mg  81 mg Oral Daily Clapacs, Jackquline Denmark, MD   81 mg at 03/22/18 2956  . divalproex (DEPAKOTE) DR tablet 500 mg  500 mg Oral Q12H Clapacs, Jackquline Denmark, MD   500 mg at 03/22/18 2000  . fentaNYL (SUBLIMAZE) injection 25 mcg  25 mcg Intravenous Q5 min PRN Yves Dill, MD      . finasteride (PROSCAR) tablet 5 mg  5 mg Oral Daily Clapacs, Jackquline Denmark, MD   5 mg at 03/22/18 2130  . FLUoxetine (PROZAC) capsule 20 mg  20 mg Oral Daily Clapacs, John T, MD   20 mg at 03/22/18 8657  . hydrOXYzine (ATARAX/VISTARIL) tablet 50 mg  50 mg Oral TID PRN Clapacs, Jackquline Denmark, MD   50 mg at 03/18/18 2154  . magnesium hydroxide (MILK OF MAGNESIA) suspension 30 mL  30 mL Oral Daily PRN Clapacs, John T, MD      . OLANZapine (ZYPREXA) tablet 15 mg  15 mg Oral QHS Clapacs, Jackquline Denmark, MD   15 mg at 03/22/18 2134  . ondansetron (ZOFRAN) injection 4 mg  4 mg Intravenous Once PRN Yves Dill, MD      . ondansetron [facility]) injection 4 mg  4 mg  Intravenous Once PRN Naomie Dean, MD      . propranolol ER (INDERAL LA) 24 hr capsule 60 mg  60 mg Oral Daily Clapacs, Jackquline Denmark, MD   60 mg at 03/22/18 8469  . rivaroxaban (XARELTO) tablet 20 mg  20 mg Oral Q supper Clapacs, Jackquline Denmark, MD   20 mg at 03/22/18 1704  . simvastatin (ZOCOR) tablet 10 mg  10 mg Oral q1800 Clapacs, Jackquline Denmark, MD   10 mg at 03/22/18 1704  . temazepam (RESTORIL) capsule 30 mg  30 mg Oral QHS Pucilowska, Jolanta B, MD   30 mg at 03/22/18 2134   PTA Medications: Medications Prior to Admission  Medication Sig Dispense Refill Last Dose  . acetaminophen (TYLENOL) 650 MG CR tablet Take 650 mg by mouth every 8 (eight) hours as needed for pain.   03/15/2018 at Unknown time  . finasteride (PROSCAR) 5 MG tablet Take 1 tablet (5 mg total) by mouth daily. 30 tablet 0 03/15/2018 at Unknown time  . FLUoxetine (PROZAC) 20 MG capsule Take 1 capsule (20 mg total) by mouth daily. 30 capsule 0 03/15/2018 at Unknown time  . GNP ASPIRIN LOW DOSE 81 MG EC tablet TAKE 1 TABLET BY MOUTH ONCE DAILY. 30  tablet 0 03/15/2018 at Unknown time  . OLANZapine (ZYPREXA) 15 MG tablet Take 1 tablet (15 mg total) by mouth at bedtime. 30 tablet 0 03/15/2018 at Unknown time  . propranolol ER (INDERAL LA) 60 MG 24 hr capsule Take 1 capsule (60 mg total) by mouth daily. 30 capsule 0 03/15/2018 at Unknown time  . QUEtiapine (SEROQUEL) 400 MG tablet Take 1 tablet (400 mg total) by mouth at bedtime. 30 tablet 1 03/15/2018 at Unknown time  . metoprolol tartrate (LOPRESSOR) 25 MG tablet TAKE 1 TABLET BY MOUTH TWICE DAILY (Patient not taking: Reported on 03/16/2018) 60 tablet 0 Not Taking at Unknown time  . simvastatin (ZOCOR) 10 MG tablet Take 1 tablet (10 mg total) by mouth daily at 6 PM. (Patient not taking: Reported on 07/21/2017) 30 tablet 0 Not Taking at Unknown time  . temazepam (RESTORIL) 15 MG capsule Take 1 capsule (15 mg total) by mouth at bedtime. (Patient not taking: Reported on 06/23/2017) 30 capsule 0 Not Taking at  Unknown time    Patient Stressors: Loss of mother Medication change or noncompliance  Patient Strengths: Active sense of humor Average or above average intelligence Supportive family/friends  Treatment Modalities: Medication Management, Group therapy, Case management,  1 to 1 session with clinician, Psychoeducation, Recreational therapy.   Physician Treatment Plan for Primary Diagnosis: Bipolar I disorder, current or most recent episode manic, with psychotic features (HCC) Long Term Goal(s): Improvement in symptoms so as ready for discharge NA   Short Term Goals: Ability to identify changes in lifestyle to reduce recurrence of condition will improve Ability to verbalize feelings will improve Ability to disclose and discuss suicidal ideas Ability to demonstrate self-control will improve Ability to identify and develop effective coping behaviors will improve Ability to maintain clinical measurements within normal limits will improve Compliance with prescribed medications will improve Ability to identify triggers associated with substance abuse/mental health issues will improve NA  Medication Management: Evaluate patient's response, side effects, and tolerance of medication regimen.  Therapeutic Interventions: 1 to 1 sessions, Unit Group sessions and Medication administration.  Evaluation of Outcomes: Progressing  Physician Treatment Plan for Secondary Diagnosis: Principal Problem:   Bipolar I disorder, current or most recent episode manic, with psychotic features (HCC) Active Problems:   Essential tremor   Current use of anticoagulant therapy   History of pulmonary embolism   BPH (benign prostatic hyperplasia)   Dyslipidemia  Long Term Goal(s): Improvement in symptoms so as ready for discharge NA   Short Term Goals: Ability to identify changes in lifestyle to reduce recurrence of condition will improve Ability to verbalize feelings will improve Ability to disclose and  discuss suicidal ideas Ability to demonstrate self-control will improve Ability to identify and develop effective coping behaviors will improve Ability to maintain clinical measurements within normal limits will improve Compliance with prescribed medications will improve Ability to identify triggers associated with substance abuse/mental health issues will improve NA     Medication Management: Evaluate patient's response, side effects, and tolerance of medication regimen.  Therapeutic Interventions: 1 to 1 sessions, Unit Group sessions and Medication administration.  Evaluation of Outcomes: Progressing   RN Treatment Plan for Primary Diagnosis: Bipolar I disorder, current or most recent episode manic, with psychotic features (HCC) Long Term Goal(s): Knowledge of disease and therapeutic regimen to maintain health will improve  Short Term Goals: Ability to verbalize feelings will improve, Ability to identify and develop effective coping behaviors will improve and Compliance with prescribed medications will improve  Medication  Management: RN will administer medications as ordered by provider, will assess and evaluate patient's response and provide education to patient for prescribed medication. RN will report any adverse and/or side effects to prescribing provider.  Therapeutic Interventions: 1 on 1 counseling sessions, Psychoeducation, Medication administration, Evaluate responses to treatment, Monitor vital signs and CBGs as ordered, Perform/monitor CIWA, COWS, AIMS and Fall Risk screenings as ordered, Perform wound care treatments as ordered.  Evaluation of Outcomes: Progressing   LCSW Treatment Plan for Primary Diagnosis: Bipolar I disorder, current or most recent episode manic, with psychotic features (HCC) Long Term Goal(s): Safe transition to appropriate next level of care at discharge, Engage patient in therapeutic group addressing interpersonal concerns.  Short Term Goals: Engage  patient in aftercare planning with referrals and resources and Increase skills for wellness and recovery  Therapeutic Interventions: Assess for all discharge needs, 1 to 1 time with Social worker, Explore available resources and support systems, Assess for adequacy in community support network, Educate family and significant other(s) on suicide prevention, Complete Psychosocial Assessment, Interpersonal group therapy.  Evaluation of Outcomes: Progressing   Progress in Treatment: Attending groups: Yes. Participating in groups: Yes. Taking medication as prescribed: Yes. Toleration medication: Yes. Family/Significant other contact made: Yes, individual(s) contacted:  [name], has been identified as pt's support/sister and has been contacted by CSW. Patient understands diagnosis: Yes. Discussing patient identified problems/goals with staff: Yes. Medical problems stabilized or resolved: Yes. Denies suicidal/homicidal ideation: Yes. Issues/concerns per patient self-inventory: No. Other: n/a  New problem(s) identified: No, Describe:  No new problems identified  New Short Term/Long Term Goal(s): "I think that I am doing better and am ready to go home.  I have a lot of things to take care of".  Discharge Plan or Barriers: Discharge plan remains for pt to return back to hotel with MM with [facility].  He may continue with maintenance ECT.  Reason for Continuation of Hospitalization: Delusions  Medication stabilization  Estimated Length of Stay: 3-5 days  Recreational Therapy: Patient Stressors: N/A Patient Goal: Patient will engage in groups without prompting or encouragement from LRT x3 group sessions within 5 recreation therapy group sessions  Attendees: Patient: Kyle [name] 03/23/2018 11:48 AM  Physician: [name], MD 03/23/2018 11:48 AM  Nursing: Hulan Amato, RN 03/23/2018 11:48 AM  RN Care Manager: 03/23/2018 11:48 AM  Social Worker: Huey Romans, LCSW  03/23/2018 11:48 AM  Recreational Therapist: Garret Reddish, LRT 03/23/2018 11:48 AM  Other: Johny Shears, LCSWA 03/23/2018 11:48 AM  Other:  03/23/2018 11:48 AM  Other: 03/23/2018 11:48 AM    Scribe for Treatment Team: Alease Frame, LCSW 03/23/2018 11:48 AM
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
## 2018-03-23 NOTE — Procedures (Signed)
ECT SERVICES Physician's Interval Evaluation & Treatment Note  Patient Identification: Kyle [name] MRN:  [id number] Date of Evaluation:  03/23/2018 TX #: 2  MADRS:   MMSE:   P.E. Findings:  No change to physical exam except his rash on his legs is looking better  Psychiatric Interval Note:  Affect is still a little bit confused and up and down and impulsive  Subjective:  Patient is a 72 y.o. male seen for evaluation for Electroconvulsive Therapy. Subjective complaint  Treatment Summary:     Right Unilateral              Bilateral   % Energy : 0.3 ms 100%   Impedance: 1130 ohms  Seizure Energy Index: 1750 V squared  Postictal Suppression Index: 61%  Seizure Concordance Index: 90%  Medications  Pre Shock: Brevital 80 mg succinylcholine 100 mg  Post Shock: None  Seizure Duration: 23 seconds by EMG 42 seconds by EEG   Comments: Follow-up Next treatment Friday  Lungs:    Clear to auscultation                Other:   Heart:      Regular rhythm              irregular rhythm      Previous H&P reviewed, patient examined and there are NO CHANGES                   Previous H&P reviewed, patient examined and there are changes noted.   [name], MD 5/1/201911:11 AM

## 2018-03-23 NOTE — Progress Notes (Signed)
D:Escorted to and from ECT this shift  Without  Incidence . Attending unit programing . Conversation logical  to related  Subject  discussed.  Verbally understanding of  information  received concerning medication . Continue to work on Pharmacologist .  Attending unit programing , able to verbalize feeling  during group process.  Voice no concerns around diet . Interacting  with other peers on unit .  Voice of no safety  concerns . Emotional and mental status  improved  A: Encourage patient participation with unit programming . Instruction  Given on  Medication , verbalize understanding. R: Voice no other concerns. Staff continue to monitor
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
## 2018-03-23 NOTE — Plan of Care (Signed)
Attending unit programing . Conversation logical  to related  Subject  discussed.  Verbally understanding of  information  received concerning medication . Continue to work on Pharmacologist .  Attending unit programing , able to verbalize feeling  during group process.  Voice no concerns around diet . Interacting  with other peers on unit .  Voice of no safety  concerns . Emotional and mental status  improved   Problem: Activity: Goal: Will verbalize the importance of balancing activity with adequate rest periods Outcome: Progressing   Problem: Education: Goal: Will be free of psychotic symptoms Outcome: Progressing Goal: Knowledge of the prescribed therapeutic regimen will improve Outcome: Progressing   Problem: Coping: Goal: Coping ability will improve Outcome: Progressing Goal: Will verbalize feelings Outcome: Progressing   Problem: Health Behavior/Discharge Planning: Goal: Compliance with prescribed medication regimen will improve Outcome: Progressing   Problem: Nutritional: Goal: Ability to achieve adequate nutritional intake will improve Outcome: Progressing   Problem: Role Relationship: Goal: Ability to communicate needs accurately will improve Outcome: Progressing Goal: Ability to interact with others will improve Outcome: Progressing   Problem: Role Relationship: Goal: Ability to interact with others will improve Outcome: Progressing   Problem: Safety: Goal: Ability to redirect hostility and anger into socially appropriate behaviors will improve Outcome: Progressing Goal: Ability to remain free from injury will improve Outcome: Progressing   Problem: Self-Care: Goal: Ability to participate in self-care as condition permits will improve Outcome: Progressing   Problem: Self-Concept: Goal: Will verbalize positive feelings about self Outcome: Progressing   Problem: Education: Goal: Knowledge of  General Education information/materials will  improve Outcome: Progressing Goal: Emotional status will improve Outcome: Progressing Goal: Mental status will improve Outcome: Progressing

## 2018-03-23 NOTE — Progress Notes (Signed)
Patient denies any acute pain as well as HI/AVH. Patient is kept NPO after midnight for ECT. No issues noted. Will continue the 15 minutes checks.

## 2018-03-24 ENCOUNTER — Other Ambulatory Visit: Payer: Self-pay | Admitting: Psychiatry

## 2018-03-24 LAB — GLUCOSE, CAPILLARY: GLUCOSE-CAPILLARY: 104 mg/dL — AB (ref 65–99)

## 2018-03-24 MED ORDER — PROPRANOLOL HCL ER 80 MG PO CP24
80.0000 mg | ORAL_CAPSULE | Freq: Every day | ORAL | Status: DC
  Administered 2018-03-25: 80 mg via ORAL
  Filled 2018-03-24: qty 1

## 2018-03-24 MED ORDER — HYDROCORTISONE 2.5 % RE CREA
TOPICAL_CREAM | Freq: Two times a day (BID) | RECTAL | Status: DC
  Administered 2018-03-24: 18:00:00 via RECTAL
  Filled 2018-03-24: qty 28.35

## 2018-03-24 NOTE — [facility] Group Notes (Signed)
LCSW Group Therapy Note 03/24/2018 9:00 AM  Type of Therapy and Topic:  Group Therapy:  Setting Goals  Participation Level:  Did Not Attend  Description of Group: In this process group, patients discussed using strengths to work toward goals and address challenges.  Patients identified two positive things about themselves and one goal they were working on.  Patients were given the opportunity to share openly and support each other's plan for self-empowerment.  The group discussed the value of gratitude and were encouraged to have a daily reflection of positive characteristics or circumstances.  Patients were encouraged to identify a plan to utilize their strengths to work on current challenges and goals.  Therapeutic Goals 1. Patient will verbalize personal strengths/positive qualities and relate how these can assist with achieving desired personal goals 2. Patients will verbalize affirmation of peers plans for personal change and goal setting 3. Patients will explore the value of gratitude and positive focus as related to successful achievement of goals 4. Patients will verbalize a plan for regular reinforcement of personal positive qualities and circumstances.  Summary of Patient Progress: Larrie was invited to today's group, but chose not to attend.      Therapeutic Modalities Cognitive Behavioral Therapy Motivational Interviewing    Alease Frame, LCSW 03/24/2018 2:10 PM

## 2018-03-24 NOTE — Progress Notes (Addendum)
Patient denies any acute pain as well as SI/HI. Resting quietly at this time with his eyes closed, respirations even and unlabored. Will continue to monitor every 15 minutes.

## 2018-03-24 NOTE — Progress Notes (Signed)
Recreation Therapy Notes  Date: 03/24/2018  Time: 9:30 am   Location: Craft Room   Behavioral response: N/A   Intervention Topic: Creative Expressions  Discussion/Intervention: Patient did not attend group.   Clinical Observations/Feedback:  Patient did not attend group.   Cleland Simkins LRT/CTRS        Avnoor Koury 03/24/2018 10:18 AM

## 2018-03-24 NOTE — Progress Notes (Signed)
Patient slept for 6 hours and 15 minutes.  

## 2018-03-24 NOTE — Plan of Care (Signed)
Alert and oriented x4. Denies SI/HI/AVH.  Rates depression, anxiety and hopelessness as 0/10.  Medication and group compliant.  Minimal interaction with peers.  Maintaining personal care chores appropriately.  Support and encouragement offered. Safety maintained.   Problem: Activity: Goal: Will verbalize the importance of balancing activity with adequate rest periods Outcome: Progressing   Problem: Education: Goal: Will be free of psychotic symptoms Outcome: Progressing Goal: Knowledge of the prescribed therapeutic regimen will improve Outcome: Progressing   Problem: Coping: Goal: Coping ability will improve Outcome: Progressing Goal: Will verbalize feelings Outcome: Progressing   Problem: Health Behavior/Discharge Planning: Goal: Compliance with prescribed medication regimen will improve Outcome: Progressing   Problem: Nutritional: Goal: Ability to achieve adequate nutritional intake will improve Outcome: Progressing   Problem: Role Relationship: Goal: Ability to communicate needs accurately will improve Outcome: Progressing Goal: Ability to interact with others will improve Outcome: Progressing   Problem: Safety: Goal: Ability to redirect hostility and anger into socially appropriate behaviors will improve Outcome: Progressing Goal: Ability to remain free from injury will improve Outcome: Progressing   Problem: Self-Care: Goal: Ability to participate in self-care as condition permits will improve Outcome: Progressing   Problem: Self-Concept: Goal: Will verbalize positive feelings about self Outcome: Progressing   Problem: Education: Goal: Knowledge of [facility] Education information/materials will improve Outcome: Progressing Goal: Emotional status will improve Outcome: Progressing Goal: Mental status will improve Outcome: Progressing   Problem: Spiritual Needs Goal: Ability to function at adequate level Outcome: Progressing
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
## 2018-03-24 NOTE — BHH Group Notes (Signed)
  03/24/2018  Time: 1PM  Type of Therapy/Topic:  Group Therapy:  Balance in Life  Participation Level:  Active  Description of Group:   This group will address the concept of balance and how it feels and looks when one is unbalanced. Patients will be encouraged to process areas in their lives that are out of balance and identify reasons for remaining unbalanced. Facilitators will guide patients in utilizing problem-solving interventions to address and correct the stressor making their life unbalanced. Understanding and applying boundaries will be explored and addressed for obtaining and maintaining a balanced life. Patients will be encouraged to explore ways to assertively make their unbalanced needs known to significant others in their lives, using other group members and facilitator for support and feedback.  Therapeutic Goals: 1. Patient will identify two or more emotions or situations they have that consume much of in their lives. 2. Patient will identify signs/triggers that life has become out of balance:  3. Patient will identify two ways to set boundaries in order to achieve balance in their lives:  4. Patient will demonstrate ability to communicate their needs through discussion and/or role plays  Summary of Patient Progress: Pt continues to work towards their tx goals but has not yet reached them. Pt was able to appropriately participate in group discussion, and was able to offer support/validation to other group members. Pt reported one area of his life he would like to devote more attention to is, "my positivity and my freedom." Pt reported one area of his life he would like to devote less attention to is, "having nothing to do."   Therapeutic Modalities:   Cognitive Behavioral Therapy Solution-Focused Therapy Assertiveness Training  Heidi Dach, MSW, LCSW Clinical Social Worker 03/24/2018 1:58 PM

## 2018-03-24 NOTE — Progress Notes (Deleted)
Recreation Therapy Notes  INPATIENT RECREATION THERAPY ASSESSMENT  Patient Details Name: Kyle [name] MRN: [id number] DOB: 08-03-1946 Today's Date: 03/24/2018  Would not complete assessment       Information Obtained From:    Able to Participate in Assessment/Interview:    Patient Presentation:    Reason for Admission (Per Patient):    Patient Stressors:    Coping Skills:      Leisure Interests (2+):     Frequency of Recreation/Participation:    Awareness of Community Resources:     [employer]:     Current Use:    If no, Barriers?:    Expressed Interest in [employer] Information:    [place] of Residence:     Patient Main Form of Transportation:    Patient Strengths:     Patient Identified Areas of Improvement:     Patient Goal for Hospitalization:     Current SI (including self-harm):     Current HI:     Current AVH:    Staff Intervention Plan:    Consent to Intern Participation:    Aylin Rhoads 03/24/2018, 11:11 AM

## 2018-03-25 ENCOUNTER — Inpatient Hospital Stay: Payer: Medicare Other | Admitting: Certified Registered Nurse Anesthetist

## 2018-03-25 ENCOUNTER — Inpatient Hospital Stay (HOSPITAL_COMMUNITY)
Admission: AD | Admit: 2018-03-25 | Discharge: 2018-03-25 | Disposition: A | Discharge status: Home / Self Care | Payer: Medicare Other | Attending: Psychiatry | Admitting: Psychiatry

## 2018-03-25 ENCOUNTER — Telehealth: Payer: Self-pay | Admitting: *Deleted

## 2018-03-25 DIAGNOSIS — F3110 Bipolar disorder, current episode manic without psychotic features, unspecified: Secondary | ICD-10-CM

## 2018-03-25 LAB — GLUCOSE, CAPILLARY
GLUCOSE-CAPILLARY: 125 mg/dL — AB (ref 65–99)
GLUCOSE-CAPILLARY: 94 mg/dL (ref 65–99)

## 2018-03-25 MED ORDER — FENTANYL CITRATE (PF) 100 MCG/2ML IJ SOLN: 25.0000 ug | INTRAMUSCULAR | Status: DC | PRN

## 2018-03-25 MED ORDER — FLUOXETINE HCL 20 MG PO CAPS: 20.0000 mg | ORAL_CAPSULE | Freq: Every day | ORAL | 1 refills | Status: DC

## 2018-03-25 MED ORDER — OLANZAPINE 15 MG PO TABS: 15.0000 mg | ORAL_TABLET | Freq: Every day | ORAL | 1 refills | Status: DC

## 2018-03-25 MED ORDER — DIVALPROEX SODIUM 500 MG PO DR TAB: 500.0000 mg | DELAYED_RELEASE_TABLET | Freq: Two times a day (BID) | ORAL | 1 refills | Status: DC

## 2018-03-25 MED ORDER — ASPIRIN 81 MG PO TBEC: 81.0000 mg | DELAYED_RELEASE_TABLET | Freq: Every day | ORAL | 1 refills | Status: DC

## 2018-03-25 MED ORDER — FINASTERIDE 5 MG PO TABS: 5.0000 mg | ORAL_TABLET | Freq: Every day | ORAL | 1 refills | Status: DC

## 2018-03-25 MED ORDER — RIVAROXABAN 20 MG PO TABS: 20.0000 mg | ORAL_TABLET | Freq: Every day | ORAL | 1 refills | Status: DC

## 2018-03-25 MED ORDER — AMMONIUM LACTATE 12 % EX LOTN: TOPICAL_LOTION | Freq: Two times a day (BID) | CUTANEOUS | 1 refills | Status: DC

## 2018-03-25 MED ORDER — ONDANSETRON HCL 4 MG/2ML IJ SOLN: 4.0000 mg | Freq: Once | INTRAMUSCULAR | Status: DC | PRN

## 2018-03-25 MED ORDER — SIMVASTATIN 10 MG PO TABS: 10.0000 mg | ORAL_TABLET | Freq: Every day | ORAL | 1 refills | Status: DC

## 2018-03-25 MED ORDER — SODIUM CHLORIDE 0.9 % IV SOLN
500.0000 mL | Freq: Once | INTRAVENOUS | Status: AC
  Administered 2018-03-25: 500 mL via INTRAVENOUS

## 2018-03-25 MED ORDER — TEMAZEPAM 30 MG PO CAPS: 30.0000 mg | ORAL_CAPSULE | Freq: Every day | ORAL | 1 refills | Status: DC

## 2018-03-25 MED ORDER — PROPRANOLOL HCL ER 80 MG PO CP24: 80.0000 mg | ORAL_CAPSULE | Freq: Every day | ORAL | 1 refills | Status: DC

## 2018-03-25 NOTE — [facility] Group Notes (Signed)
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
## 2018-03-25 NOTE — Anesthesia Postprocedure Evaluation (Signed)
Anesthesia Post Note  Patient: Kyle [name]  Procedure(s) Performed: ECT TX  Patient location during evaluation: PACU Anesthesia Type: General Level of consciousness: awake and alert Pain management: pain level controlled Vital Signs Assessment: post-procedure vital signs reviewed and stable Respiratory status: spontaneous breathing, nonlabored ventilation, respiratory function stable and patient connected to nasal cannula oxygen Cardiovascular status: blood pressure returned to baseline and stable Postop Assessment: no apparent nausea or vomiting Anesthetic complications: no     Last Vitals:  Vitals:   03/25/18 1133 03/25/18 1203  BP: (!) 152/78 117/79  Pulse: 62 (!) 59  Resp: 17 17  Temp: 37.1 C   SpO2: 100% 97%    Last Pain:  Vitals:   03/25/18 1203  TempSrc:   PainSc: 0-No pain                 Ezella Kell S

## 2018-03-25 NOTE — Transfer of Care (Signed)
Immediate Anesthesia Transfer of Care Note  Patient: Kyle [name]  Procedure(s) Performed: ECT TX  Patient Location: PACU  Anesthesia Type:General  Level of Consciousness: awake, alert  and oriented  Airway & Oxygen Therapy: Patient Spontanous Breathing and Patient connected to face mask oxygen  Post-op Assessment: Report given to RN and Post -op Vital signs reviewed and stable  Post vital signs: Reviewed and stable  Last Vitals:  Vitals Value Taken Time  BP 152/78 03/25/2018 11:33 AM  Temp    Pulse 66 03/25/2018 11:35 AM  Resp 16 03/25/2018 11:35 AM  SpO2 100 % 03/25/2018 11:35 AM  Vitals shown include unvalidated device data.  Last Pain:  Vitals:   03/25/18 0910  TempSrc: Oral  PainSc: 5          Complications: No apparent anesthesia complications

## 2018-03-25 NOTE — Addendum Note (Signed)
Addendum  created 03/25/18 1412 by Malva Cogan, CRNA   Attestation recorded in [place], [employer] filed

## 2018-03-25 NOTE — BHH Suicide Risk Assessment (Signed)
[facility] Discharge Suicide Risk Assessment   Principal Problem: Bipolar I disorder, current or most recent episode manic, with psychotic features [facility]) Discharge Diagnoses:  Patient Active Problem List   Diagnosis Date Noted  . BPH (benign prostatic hyperplasia) [N40.0] 03/17/2018  . Dyslipidemia [E78.5] 03/17/2018  . Bipolar I disorder, current or most recent episode manic, with psychotic features (HCC) [F31.2] 03/16/2018  . History of stroke [Z86.73] 03/16/2018  . Current use of anticoagulant therapy [Z79.01] 03/16/2018  . History of pulmonary embolism [Z86.711] 03/16/2018  . Bipolar 1 disorder, depressed (HCC) [F31.9] 02/03/2017  . 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 Diabetes (HCC) [E11.9] 01/26/2017  . Suicide attempt (HCC) [T14.91XA]   . SVT (supraventricular tachycardia) (HCC) [I47.1]   . Noncompliance [Z91.19] 09/26/2015  . RLS (restless legs syndrome) [G25.81] 08/08/2015  . Peripheral neuropathy (HCC) [G62.9] 08/08/2015  . Cerebrovascular disease [I67.9] 08/07/2015  . 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 Essential tremor [G25.0] 08/01/2015  . Hypertension [I10] 04/17/2015    Total Time spent with patient: 45 minutes  Musculoskeletal: Strength & Muscle Tone: within normal limits Gait & Station: normal Patient leans: N/A  Psychiatric Specialty Exam: Review of Systems  Constitutional: Negative.   HENT: Negative.   Eyes: Negative.   Respiratory: Negative.   Cardiovascular: Negative.   Gastrointestinal: Negative.   Musculoskeletal: Negative.   Skin: Negative.   Neurological: Negative.   Psychiatric/Behavioral: Negative.  Negative for depression, hallucinations, substance abuse and suicidal ideas.    Blood pressure 132/68, pulse 82, temperature 97.7 F (36.5 C), temperature source Oral, resp. rate 20, height  (1.702 m), weight 100.7 kg (222 lb), SpO2 98 %.Body mass index is 34.77 kg/m.  General Appearance: Casual  Eye  Contact::  Good  Speech:  Slow409  Volume:  Normal  Mood:  Euthymic  Affect:  Appropriate  Thought Process:  Goal Directed  Orientation:  Full (Time, Place, and Person)  Thought Content:  Logical  Suicidal Thoughts:  No  Homicidal Thoughts:  No  Memory:  Immediate;   Fair Recent;   Fair Remote;   Fair  Judgement:  Fair  Insight:  Fair  Psychomotor Activity:  Normal  Concentration:  Fair  Recall:  [employer] of Knowledge:Fair  Language: Fair  Akathisia:  No  Handed:  Right  AIMS (if indicated):     Assets:  Communication Skills Desire for Improvement Financial Resources/Insurance Social Support  Sleep:  Number of Hours: 7.3  Cognition: WNL  ADL's:  Intact   Mental Status Per Nursing Assessment::   On Admission:  NA  Demographic Factors:  Age 72 or older, Divorced or widowed, Caucasian and Living alone  Loss Factors: Decline in physical health  Historical Factors: Prior suicide attempts  Risk Reduction Factors:   Sense of responsibility to family, Positive social support and Positive therapeutic relationship  Continued Clinical Symptoms:  Bipolar Disorder:   Mixed State  Cognitive Features That Contribute To Risk:  Closed-mindedness    Suicide Risk:  Mild:  Suicidal ideation of limited frequency, intensity, duration, and specificity.  There are no identifiable plans, no associated intent, mild dysphoria and related symptoms, good self-control (both objective and subjective assessment), few other risk factors, and identifiable protective factors, including available and accessible social support.  Follow-up Information    [facility] Follow up on 04/07/2018.   Why:  Your follow-up appointment is scheduled with Dr. Linton Rump on 04/07/2018 at 12:30PM. Contact information: [address] [place]  [address] [phone]           Plan Of Care/Follow-up recommendations:  Activity:  Activity as tolerated Diet:  Heart healthy  diet Other:  Follow-up in [facility] and with outpatient ECT  [name], MD 03/25/2018, 1:04 PM

## 2018-03-25 NOTE — Procedures (Signed)
ECT SERVICES Physician's Interval Evaluation & Treatment Note  Patient Identification: Kyle [name] MRN:  [id number] Date of Evaluation:  03/25/2018 TX #: 3  MADRS:   MMSE:   P.E. Findings:  No change physical exam heart and lungs normal vitals normal  Psychiatric Interval Note:  Mood is better he is stable minimal cognitive effects  Subjective:  Patient is a 72 y.o. male seen for evaluation for Electroconvulsive Therapy. No complaints  Treatment Summary:     Right Unilateral              Bilateral   % Energy : 0.3 ms 100%   Impedance: 1510 ohms  Seizure Energy Index: 1072 V squared  Postictal Suppression Index: 55%  Seizure Concordance Index: 69%  Medications  Pre Shock: Brevital 80 mg succinylcholine 100 mg  Post Shock:    Seizure Duration: 13 seconds EMG 22 seconds EEG   Comments: Follow-up in 2 weeks  Lungs:    Clear to auscultation                Other:   Heart:      Regular rhythm              irregular rhythm      Previous H&P reviewed, patient examined and there are NO CHANGES                   Previous H&P reviewed, patient examined and there are changes noted.   [name], MD 5/3/201911:10 AM

## 2018-03-25 NOTE — Progress Notes (Signed)
Recreation Therapy Notes  INPATIENT RECREATION TR PLAN  Patient Details Name: Kyle [name] MRN: [id number] DOB: March 05, 1946 Today's Date: 03/25/2018  Rec Therapy Plan Is patient appropriate for Therapeutic Recreation?: Yes Treatment times per week: at least 3 Estimated Length of Stay: 5-7 days TR Treatment/Interventions: Group participation (Comment)  Discharge Criteria Pt will be discharged from therapy if:: Discharged Treatment plan/goals/alternatives discussed and agreed upon by:: Patient/family  Discharge Summary Short term goals set: Patient will engage in groups without prompting or encouragement from LRT x3 group sessions within 5 recreation therapy group sessions Short term goals met: Not met Progress toward goals comments: Groups attended Which groups?: Communication, Coping skills Reason goals not met: Patient spent most of his time in his room Therapeutic equipment acquired: N/A Reason patient discharged from therapy: Discharge from hospital Pt/family agrees with progress & goals achieved: Yes Date patient discharged from therapy: 03/25/18   Kyle [name] 03/25/2018, 1:32 PM

## 2018-03-25 NOTE — Progress Notes (Signed)
Recreation Therapy Notes  Date: 03/25/2018  Time: 9:30 am   Location: Craft Room   Behavioral response: N/A   Intervention Topic: Leisure  Discussion/Intervention: Patient did not attend group.   Clinical Observations/Feedback:  Patient did not attend group.   Jeaninne Lodico LRT/CTRS         Janace Decker 03/25/2018 10:26 AM 

## 2018-03-25 NOTE — Progress Notes (Signed)
  [facility] Adult Case Management Discharge Plan :  Will you be returning to the same living situation after discharge:  Yes,  Pt will be returning to the hotel in [place], [place] At discharge, do you have transportation home?: Yes,  pt's sister will be picking him up at discharge. Do you have the ability to pay for your medications: Yes,  Pt has medical insurance and financial means to pay for his medications.  Release of information consent forms completed and in the chart;  Patient's signature needed at discharge.  Patient to Follow up at: Follow-up Information    [facility] Follow up on 04/07/2018.   Why:  Your follow-up appointment is scheduled with Dr. Linton Rump on 04/07/2018 at 12:30PM. Contact information: [address] [facility] [address] [phone]           Next level of care provider has access to [facility] Link:no  Safety Planning and Suicide Prevention discussed: Yes,  No safety concerns noted.  Have you used any form of tobacco in the last 30 days? (Cigarettes, Smokeless Tobacco, Cigars, and/or Pipes): No  Has patient been referred to the Quitline?: N/A patient is not a smoker  Patient has been referred for addiction treatment: N/A  Alease Frame, LCSW 03/25/2018, 9:50 AM
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
## 2018-03-25 NOTE — Progress Notes (Signed)
Patient denies SI/HI, denies A/V hallucinations. Patient verbalizes understanding of discharge instructions, follow up care and prescriptions. Patient given all belongings from  locker. Patient escorted out by staff, transported by family. 

## 2018-03-25 NOTE — Progress Notes (Signed)
   03/25/18 1520  Clinical Encounter Type  Visited With Patient  Visit Type Initial   Patient participated in chaplain led group, focus on meditation and communication.

## 2018-04-07 ENCOUNTER — Other Ambulatory Visit: Payer: Self-pay | Admitting: Psychiatry

## 2018-04-08 ENCOUNTER — Encounter: Payer: Self-pay | Admitting: Certified Registered"

## 2018-04-08 ENCOUNTER — Encounter
Admission: RE | Admit: 2018-04-08 | Discharge: 2018-04-08 | Disposition: A | Discharge status: Home / Self Care | Payer: Medicare Other | Source: Ambulatory Visit | Attending: Psychiatry | Admitting: Psychiatry

## 2018-04-08 DIAGNOSIS — F3130 Bipolar disorder, current episode depressed, mild or moderate severity, unspecified: Secondary | ICD-10-CM | POA: Diagnosis not present

## 2018-04-08 DIAGNOSIS — F319 Bipolar disorder, unspecified: Secondary | ICD-10-CM | POA: Insufficient documentation

## 2018-04-08 MED ORDER — METHOHEXITAL SODIUM 100 MG/10ML IV SOSY
PREFILLED_SYRINGE | INTRAVENOUS | Status: DC | PRN
  Administered 2018-04-08: 80 mg via INTRAVENOUS

## 2018-04-08 MED ORDER — SUCCINYLCHOLINE CHLORIDE 20 MG/ML IJ SOLN
INTRAMUSCULAR | Status: DC | PRN
  Administered 2018-04-08: 120 mg via INTRAVENOUS

## 2018-04-08 MED ORDER — SODIUM CHLORIDE 0.9 % IV SOLN
500.0000 mL | Freq: Once | INTRAVENOUS | Status: AC
  Administered 2018-04-08: 500 mL via INTRAVENOUS

## 2018-04-08 NOTE — Transfer of Care (Signed)
Immediate Anesthesia Transfer of Care Note  Patient: Kyle [name]  Procedure(s) Performed: ECT TX  Patient Location: PACU  Anesthesia Type:General  Level of Consciousness: sedated and responds to stimulation  Airway & Oxygen Therapy: Patient Spontanous Breathing and Patient connected to face mask oxygen  Post-op Assessment: Report given to RN and Post -op Vital signs reviewed and stable  Post vital signs: Reviewed and stable  Last Vitals:  Vitals Value Taken Time  BP 151/76 04/08/2018 11:16 AM  Temp    Pulse 65 04/08/2018 11:16 AM  Resp 22 04/08/2018 11:16 AM  SpO2 100 % 04/08/2018 11:16 AM    Last Pain:  Vitals:   04/08/18 0904  TempSrc:   PainSc: 0-No pain         Complications: No apparent anesthesia complications
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
## 2018-04-08 NOTE — Anesthesia Procedure Notes (Signed)
Procedure Name: LMA Insertion Performed by: Jakoby Melendrez, CRNA Pre-anesthesia Checklist: Patient identified, Patient being monitored, Timeout performed, Emergency Drugs available and Suction available Patient Re-evaluated:Patient Re-evaluated prior to induction Oxygen Delivery Method: Circle system utilized Preoxygenation: Pre-oxygenation with 100% oxygen Induction Type: IV induction Ventilation: Mask ventilation without difficulty LMA: LMA inserted LMA Size: 4.0 Tube type: Oral Number of attempts: 1 Placement Confirmation: positive ETCO2 and breath sounds checked- equal and bilateral Tube secured with: Tape Dental Injury: Teeth and Oropharynx as per pre-operative assessment        

## 2018-04-08 NOTE — Anesthesia Procedure Notes (Signed)
Procedure Name: LMA Insertion Date/Time: 04/08/2018 11:02 AM Performed by: Casey Burkitt, CRNA Pre-anesthesia Checklist: Patient identified, Patient being monitored, Timeout performed, Emergency Drugs available and Suction available Patient Re-evaluated:Patient Re-evaluated prior to induction Oxygen Delivery Method: Circle system utilized Preoxygenation: Pre-oxygenation with 100% oxygen Induction Type: IV induction Ventilation: Mask ventilation without difficulty LMA: LMA inserted LMA Size: 4.0 Tube type: Oral Number of attempts: 1 Placement Confirmation: positive ETCO2 and breath sounds checked- equal and bilateral Tube secured with: Tape Dental Injury: Teeth and Oropharynx as per pre-operative assessment
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
## 2018-04-08 NOTE — Anesthesia Post-op Follow-up Note (Signed)
Anesthesia QCDR form completed.        

## 2018-04-08 NOTE — Discharge Instructions (Signed)
1)  The drugs that you have been given will stay in your system until tomorrow so for the       next 24 hours you should not:  A. Drive an automobile  B. Make any legal decisions  C. Drink any alcoholic beverages  2)  You may resume your regular meals upon return home.  3)  A responsible adult must take you home.  Someone should stay with you for a few          hours, then be available by phone for the remainder of the treatment day.  4)  You May experience any of the following symptoms:  Headache, Nausea and a dry mouth (due to the medications you were given),  temporary memory loss and some confusion, or sore muscles (a warm bath  should help this).  If you you experience any of these symptoms let us know on                your return visit.  5)  Report any of the following: any acute discomfort, severe headache, or temperature        greater than 100.5 F.   Also report any unusual redness, swelling, drainage, or pain         at your IV site.    You may report Symptoms to:  ECT PROGRAM- [place] [facility]          Phone: [phone], ECT Department           or Dr. Shary Key office [phone]  6)  Your next ECT Treatment is Day Friday  Date June 14 ,2019  We will call 2 days prior to your scheduled appointment for arrival times.  7)  Nothing to eat or drink after midnight the night before your procedure.  8)  Take      With a sip of water the morning of your procedure.  9)  Other Instructions: Call [phone] to cancel the morning of your procedure due         to illness or emergency.  10) We will call within 72 hours to assess how you are feeling.

## 2018-04-08 NOTE — Procedures (Signed)
ECT SERVICES Physician's Interval Evaluation & Treatment Note  Patient Identification: Kyle [name] MRN:  [id number] Date of Evaluation:  04/08/2018 TX #: 12  MADRS: 16  MMSE: 29  P.E. Findings:  No new physical findings.  Stable.  Seems to be doing well.  Psychiatric Interval Note:  No complaints mood stable  Subjective:  Patient is a 72 y.o. male seen for evaluation for Electroconvulsive Therapy. Patient is feeling well  Treatment Summary:     Right Unilateral              Bilateral   % Energy : 0.3 ms 100%   Impedance: 1030 ohms  Seizure Energy Index: 2070 V squared  Postictal Suppression Index: 24%  Seizure Concordance Index: 93%  Medications  Pre Shock: Brevital 80 mg succinylcholine 100 mg  Post Shock:    Seizure Duration: 22 seconds by EMG 32 seconds by EEG   Comments: Follow-up 4 weeks  Lungs:    Clear to auscultation                Other:   Heart:      Regular rhythm              irregular rhythm      Previous H&P reviewed, patient examined and there are NO CHANGES                   Previous H&P reviewed, patient examined and there are changes noted.   Kyle Rasmussen, MD 5/17/201910:57 AM

## 2018-04-09 NOTE — Anesthesia Postprocedure Evaluation (Signed)
Anesthesia Post Note  Patient: Kyle [name]  Procedure(s) Performed: ECT TX  Patient location during evaluation: PACU Anesthesia Type: General Level of consciousness: awake and alert Pain management: pain level controlled Vital Signs Assessment: post-procedure vital signs reviewed and stable Respiratory status: spontaneous breathing, nonlabored ventilation, respiratory function stable and patient connected to nasal cannula oxygen Cardiovascular status: blood pressure returned to baseline and stable Postop Assessment: no apparent nausea or vomiting Anesthetic complications: no     Last Vitals:  Vitals:   04/08/18 1145 04/08/18 1159  BP: (!) 151/77 122/74  Pulse: 68 67  Resp:  18  Temp: 36.6 C   SpO2:      Last Pain:  Vitals:   04/08/18 1159  TempSrc:   PainSc: 0-No pain                 Lenard Simmer
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
## 2018-04-25 ENCOUNTER — Telehealth: Payer: Self-pay | Admitting: *Deleted

## 2018-05-03 ENCOUNTER — Other Ambulatory Visit: Payer: Self-pay | Admitting: Psychiatry

## 2018-05-06 ENCOUNTER — Telehealth: Payer: Self-pay | Admitting: *Deleted

## 2018-05-08 ENCOUNTER — Other Ambulatory Visit: Payer: Self-pay | Admitting: Psychiatry

## 2018-05-09 ENCOUNTER — Telehealth: Payer: Self-pay | Admitting: *Deleted

## 2018-05-09 ENCOUNTER — Ambulatory Visit
Admission: RE | Admit: 2018-05-09 | Discharge: 2018-05-09 | Disposition: A | Discharge status: Home / Self Care | Payer: Medicare Other | Source: Ambulatory Visit

## 2018-05-10 ENCOUNTER — Other Ambulatory Visit: Payer: Self-pay | Admitting: Psychiatry

## 2018-05-11 ENCOUNTER — Other Ambulatory Visit: Payer: Self-pay | Admitting: Psychiatry

## 2018-05-11 ENCOUNTER — Ambulatory Visit
Admission: RE | Admit: 2018-05-11 | Discharge: 2018-05-11 | Disposition: A | Discharge status: Home / Self Care | Payer: Medicare Other | Source: Ambulatory Visit | Attending: Psychiatry | Admitting: Psychiatry

## 2018-05-11 ENCOUNTER — Encounter: Payer: Self-pay | Admitting: Anesthesiology

## 2018-05-11 DIAGNOSIS — I471 Supraventricular tachycardia: Secondary | ICD-10-CM | POA: Insufficient documentation

## 2018-05-11 DIAGNOSIS — E1140 Type 2 diabetes mellitus with diabetic neuropathy, unspecified: Secondary | ICD-10-CM | POA: Diagnosis not present

## 2018-05-11 DIAGNOSIS — Z7982 Long term (current) use of aspirin: Secondary | ICD-10-CM | POA: Diagnosis not present

## 2018-05-11 DIAGNOSIS — Z7901 Long term (current) use of anticoagulants: Secondary | ICD-10-CM | POA: Diagnosis not present

## 2018-05-11 DIAGNOSIS — F319 Bipolar disorder, unspecified: Secondary | ICD-10-CM | POA: Diagnosis not present

## 2018-05-11 DIAGNOSIS — N400 Enlarged prostate without lower urinary tract symptoms: Secondary | ICD-10-CM | POA: Insufficient documentation

## 2018-05-11 DIAGNOSIS — I10 Essential (primary) hypertension: Secondary | ICD-10-CM | POA: Insufficient documentation

## 2018-05-11 DIAGNOSIS — E785 Hyperlipidemia, unspecified: Secondary | ICD-10-CM | POA: Insufficient documentation

## 2018-05-11 DIAGNOSIS — Z79899 Other long term (current) drug therapy: Secondary | ICD-10-CM | POA: Insufficient documentation

## 2018-05-11 DIAGNOSIS — F3130 Bipolar disorder, current episode depressed, mild or moderate severity, unspecified: Secondary | ICD-10-CM | POA: Diagnosis not present

## 2018-05-11 MED ORDER — DIVALPROEX SODIUM 500 MG PO DR TAB
DELAYED_RELEASE_TABLET | ORAL | 2 refills | Status: DC
Start: 2018-05-11 — End: 2019-08-28

## 2018-05-11 MED ORDER — FINASTERIDE 5 MG PO TABS
5.0000 mg | ORAL_TABLET | Freq: Every day | ORAL | 2 refills | Status: DC
Start: 2018-05-11 — End: 2020-01-28

## 2018-05-11 MED ORDER — SODIUM CHLORIDE 0.9 % IV SOLN
500.0000 mL | Freq: Once | INTRAVENOUS | Status: AC
  Administered 2018-05-11: 500 mL via INTRAVENOUS

## 2018-05-11 MED ORDER — SODIUM CHLORIDE 0.9 % IV SOLN
INTRAVENOUS | Status: DC | PRN
  Administered 2018-05-11 (×2): via INTRAVENOUS

## 2018-05-11 MED ORDER — RIVAROXABAN 20 MG PO TABS: ORAL_TABLET | ORAL | 2 refills | Status: DC

## 2018-05-11 MED ORDER — ASPIRIN 81 MG PO TBEC
DELAYED_RELEASE_TABLET | ORAL | 2 refills | Status: DC
Start: 2018-05-11 — End: 2018-07-08

## 2018-05-11 MED ORDER — SUCCINYLCHOLINE CHLORIDE 200 MG/10ML IV SOSY
PREFILLED_SYRINGE | INTRAVENOUS | Status: DC | PRN
  Administered 2018-05-11: 120 mg via INTRAVENOUS

## 2018-05-11 MED ORDER — OLANZAPINE 15 MG PO TABS: 15.0000 mg | ORAL_TABLET | Freq: Every day | ORAL | 2 refills | Status: DC

## 2018-05-11 MED ORDER — SIMVASTATIN 10 MG PO TABS: ORAL_TABLET | ORAL | 2 refills | Status: DC

## 2018-05-11 MED ORDER — FLUOXETINE HCL 20 MG PO CAPS: 20.0000 mg | ORAL_CAPSULE | Freq: Every day | ORAL | 2 refills | Status: DC

## 2018-05-11 MED ORDER — METHOHEXITAL SODIUM 100 MG/10ML IV SOSY
PREFILLED_SYRINGE | INTRAVENOUS | Status: DC | PRN
  Administered 2018-05-11: 80 mg via INTRAVENOUS

## 2018-05-11 MED ORDER — TEMAZEPAM 30 MG PO CAPS: 30.0000 mg | ORAL_CAPSULE | Freq: Every day | ORAL | 2 refills | Status: DC

## 2018-05-11 MED ORDER — PROPRANOLOL HCL ER 80 MG PO CP24: 80.0000 mg | ORAL_CAPSULE | Freq: Every day | ORAL | 2 refills | Status: DC
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
## 2018-05-11 NOTE — Anesthesia Procedure Notes (Signed)
Procedure Name: LMA Insertion Date/Time: 05/11/2018 11:35 AM Performed by: Lily KocherPeralta, Reia Viernes, CRNA Pre-anesthesia Checklist: Patient identified, Patient being monitored, Timeout performed, Emergency Drugs available and Suction available Patient Re-evaluated:Patient Re-evaluated prior to induction Oxygen Delivery Method: Circle system utilized Preoxygenation: Pre-oxygenation with 100% oxygen Induction Type: IV induction Ventilation: Mask ventilation without difficulty LMA: LMA inserted LMA Size: 4.0 Tube type: Oral Number of attempts: 1 Placement Confirmation: positive ETCO2 and breath sounds checked- equal and bilateral Tube secured with: Tape Dental Injury: Teeth and Oropharynx as per pre-operative assessment

## 2018-05-11 NOTE — Procedures (Signed)
ECT SERVICES Physician's Interval Evaluation & Treatment Note  Patient Identification: Kyle MulliganMarvin Daniel [name] MRN:  [id number] Date of Evaluation:  05/11/2018 TX #: 19  MADRS:   MMSE:   P.E. Findings:  Physical exam stable.  Legs are improved.  No new findings  Psychiatric Interval Note:  Mood stable no report of depression or mania  Subjective:  Patient is a 72 y.o. male seen for evaluation for Electroconvulsive Therapy. No significant complaint  Treatment Summary:   [x]   Right Unilateral             []  Bilateral   % Energy : 0.3 ms 100%   Impedance: 1340 ohms  Seizure Energy Index: No reading  Postictal Suppression Index: No reading  Seizure Concordance Index: 95%  Medications  Pre Shock: Brevital 80 mg succinylcholine 100 mg  Post Shock:    Seizure Duration: 28 seconds by EMG 28 seconds by EEG   Comments: A little difficulty with the leads but the seizure was pretty clear to reading by the eye.  Patient had no complications we will see him back in 4 weeks  Lungs:  [x]   Clear to auscultation               []  Other:   Heart:    [x]   Regular rhythm             []  irregular rhythm    [x]   Previous H&P reviewed, patient examined and there are NO CHANGES                 []   Previous H&P reviewed, patient examined and there are changes noted.   Kyle RasmussenJohn Levita Monical, MD 6/19/201911:19 AM

## 2018-05-11 NOTE — Anesthesia Postprocedure Evaluation (Signed)
Anesthesia Post Note  Patient: Kyle [name]  Procedure(s) Performed: ECT TX  Patient location during evaluation: PACU Anesthesia Type: General Level of consciousness: awake and alert Pain management: pain level controlled Vital Signs Assessment: post-procedure vital signs reviewed and stable Respiratory status: spontaneous breathing, nonlabored ventilation and respiratory function stable Cardiovascular status: blood pressure returned to baseline and stable Postop Assessment: no apparent nausea or vomiting Anesthetic complications: no     Last Vitals:  Vitals:   05/11/18 1218 05/11/18 1237  BP: (!) 154/80 (!) 154/82  Pulse: (!) 51 (!) 51  Resp: 20 18  Temp: 36.4 C 36.4 C  SpO2: 97%     Last Pain:  Vitals:   05/11/18 1237  TempSrc: Oral  PainSc:                  Christia ReadingScott T Phoebie Shad

## 2018-05-11 NOTE — Discharge Instructions (Signed)
1)  The drugs that you have been given will stay in your system until tomorrow so for the       next 24 hours you should not:  A. Drive an automobile  B. Make any legal decisions  C. Drink any alcoholic beverages  2)  You may resume your regular meals upon return home.  3)  A responsible adult must take you home.  Someone should stay with you for a few          hours, then be available by phone for the remainder of the treatment day.  4)  You May experience any of the following symptoms:  Headache, Nausea and a dry mouth (due to the medications you were given),  temporary memory loss and some confusion, or sore muscles (a warm bath  should help this).  If you you experience any of these symptoms let us know on                your return visit.  5)  Report any of the following: any acute discomfort, severe headache, or temperature        greater than 100.5 F.   Also report any unusual redness, swelling, drainage, or pain         at your IV site.    You may report Symptoms to:  ECT PRO[facility]- [place] [facility]          Phone: [phone], ECT Department           or Dr. Shary Keylapac's office [phone]  6)  Your next ECT Treatment is Wednesday July 17  We will call 2 days prior to your scheduled appointment for arrival times.  7)  Nothing to eat or drink after midnight the night before your procedure.  8)  Take     With a sip of water the morning of your procedure.  9)  Other Instructions: Call [phone] to cancel the morning of your procedure due         to illness or emergency.  10) We will call within 72 hours to assess how you are feeling.

## 2018-05-11 NOTE — Transfer of Care (Signed)
Immediate Anesthesia Transfer of Care Note  Patient: Kyle [name]  Procedure(s) Performed: ECT TX  Patient Location: PACU  Anesthesia Type:General  Level of Consciousness: sedated  Airway & Oxygen Therapy: Patient Spontanous Breathing and Patient connected to face mask oxygen  Post-op Assessment: Report given to RN and Post -op Vital signs reviewed and stable  Post vital signs: Reviewed and stable  Last Vitals:  Vitals Value Taken Time  BP 170/86 05/11/2018 11:48 AM  Temp 36.4 C 05/11/2018 11:48 AM  Pulse 50 05/11/2018 11:49 AM  Resp 19 05/11/2018 11:49 AM  SpO2 100 % 05/11/2018 11:49 AM  Vitals shown include unvalidated device data.  Last Pain:  Vitals:   05/11/18 1148  TempSrc:   PainSc: 0-No pain         Complications: No apparent anesthesia complications

## 2018-05-11 NOTE — Anesthesia Post-op Follow-up Note (Signed)
Anesthesia QCDR form completed.        
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
## 2018-05-12 LAB — GLUCOSE, CAPILLARY: GLUCOSE-CAPILLARY: 98 mg/dL (ref 65–99)

## 2018-06-03 ENCOUNTER — Telehealth: Payer: Self-pay | Admitting: *Deleted

## 2018-06-08 ENCOUNTER — Encounter
Admission: RE | Admit: 2018-06-08 | Discharge: 2018-06-08 | Disposition: A | Discharge status: Home / Self Care | Payer: Medicare Other | Source: Ambulatory Visit | Attending: Psychiatry | Admitting: Psychiatry

## 2018-06-08 ENCOUNTER — Encounter: Payer: Self-pay | Admitting: Anesthesiology

## 2018-06-08 ENCOUNTER — Other Ambulatory Visit: Payer: Self-pay | Admitting: Psychiatry

## 2018-06-08 DIAGNOSIS — F319 Bipolar disorder, unspecified: Secondary | ICD-10-CM | POA: Diagnosis not present

## 2018-06-08 MED ORDER — METHOHEXITAL SODIUM 100 MG/10ML IV SOSY
PREFILLED_SYRINGE | INTRAVENOUS | Status: DC | PRN
  Administered 2018-06-08: 80 mg via INTRAVENOUS

## 2018-06-08 MED ORDER — LABETALOL HCL 5 MG/ML IV SOLN
INTRAVENOUS | Status: AC
  Filled 2018-06-08: qty 4

## 2018-06-08 MED ORDER — LABETALOL HCL 5 MG/ML IV SOLN
INTRAVENOUS | Status: DC | PRN
  Administered 2018-06-08: 10 mg via INTRAVENOUS

## 2018-06-08 MED ORDER — SODIUM CHLORIDE 0.9 % IV SOLN
INTRAVENOUS | Status: DC | PRN
  Administered 2018-06-08: 10:00:00 via INTRAVENOUS

## 2018-06-08 MED ORDER — SUCCINYLCHOLINE CHLORIDE 20 MG/ML IJ SOLN
INTRAMUSCULAR | Status: AC
  Filled 2018-06-08: qty 1

## 2018-06-08 MED ORDER — SODIUM CHLORIDE 0.9 % IV SOLN
500.0000 mL | Freq: Once | INTRAVENOUS | Status: AC
  Administered 2018-06-08: 500 mL via INTRAVENOUS

## 2018-06-08 MED ORDER — SUCCINYLCHOLINE CHLORIDE 200 MG/10ML IV SOSY
PREFILLED_SYRINGE | INTRAVENOUS | Status: DC | PRN
Start: 2018-06-08 — End: 2018-06-08
  Administered 2018-06-08: 120 mg via INTRAVENOUS

## 2018-06-08 NOTE — Transfer of Care (Signed)
Immediate Anesthesia Transfer of Care Note  Patient: Kyle [name]  Procedure(s) Performed: ECT TX  Patient Location: PACU  Anesthesia Type:General  Level of Consciousness: sedated  Airway & Oxygen Therapy: Patient Spontanous Breathing and Patient connected to face mask oxygen  Post-op Assessment: Report given to RN and Post -op Vital signs reviewed and stable  Post vital signs: Reviewed and stable  Last Vitals:  Vitals Value Taken Time  BP    Temp    Pulse 65 06/08/2018 11:18 AM  Resp 18 06/08/2018 11:18 AM  SpO2 99 % 06/08/2018 11:18 AM  Vitals shown include unvalidated device data.  Last Pain:  Vitals:   06/08/18 0925  TempSrc: Oral  PainSc: 0-No pain         Complications: No apparent anesthesia complications

## 2018-06-08 NOTE — Anesthesia Post-op Follow-up Note (Signed)
Anesthesia QCDR form completed.        

## 2018-06-08 NOTE — Procedures (Signed)
ECT SERVICES Physician's Interval Evaluation & Treatment Note  Patient Identification: Kyle [name] MRN:  [id number] Date of Evaluation:  06/08/2018 TX #: 20  MADRS:   MMSE:   P.E. Findings:  No change to physical exam  Psychiatric Interval Note:  Patient appears to be stable without any signs of obvious depression or mania or psychosis  Subjective:  Patient is a 72 y.o. male seen for evaluation for Electroconvulsive Therapy. He has no complaints himself  Treatment Summary:   [x]   Right Unilateral             []  Bilateral   % Energy : 0.3 ms 100%   Impedance: 880 ohms  Seizure Energy Index: 21,525 V squared  Postictal Suppression Index: 16%  Seizure Concordance Index: 79%  Medications  Pre Shock: Labetalol 10 mg Brevital 80 mg succinylcholine 120 mg  Post Shock:    Seizure Duration: 20 seconds EMG 47 seconds EEG   Comments: A little excessive movement we will go up to 150 mg of succinylcholine for his next treatment which will be in a little over 4 weeks  Lungs:  [x]   Clear to auscultation               []  Other:   Heart:    [x]   Regular rhythm             []  irregular rhythm    [x]   Previous H&P reviewed, patient examined and there are NO CHANGES                 []   Previous H&P reviewed, patient examined and there are changes noted.   Kyle RasmussenJohn Valeria Boza, MD 7/17/201910:56 AM

## 2018-06-08 NOTE — Anesthesia Procedure Notes (Signed)
Procedure Name: LMA Insertion Date/Time: 06/08/2018 11:02 AM Performed by: Lily KocherPeralta, Debbera Wolken, CRNA Pre-anesthesia Checklist: Patient identified, Patient being monitored, Timeout performed, Emergency Drugs available and Suction available Patient Re-evaluated:Patient Re-evaluated prior to induction Oxygen Delivery Method: Circle system utilized Preoxygenation: Pre-oxygenation with 100% oxygen Induction Type: IV induction Ventilation: Mask ventilation without difficulty LMA: LMA inserted LMA Size: 4.0 Tube type: Oral Number of attempts: 1 Placement Confirmation: positive ETCO2 and breath sounds checked- equal and bilateral Tube secured with: Tape Dental Injury: Teeth and Oropharynx as per pre-operative assessment
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
## 2018-06-08 NOTE — Anesthesia Postprocedure Evaluation (Signed)
Anesthesia Post Note  Patient: Kyle [name]  Procedure(s) Performed: ECT TX  Patient location during evaluation: PACU Anesthesia Type: General Level of consciousness: awake and alert Pain management: pain level controlled Vital Signs Assessment: post-procedure vital signs reviewed and stable Respiratory status: spontaneous breathing, nonlabored ventilation and respiratory function stable Cardiovascular status: blood pressure returned to baseline and stable Postop Assessment: no apparent nausea or vomiting Anesthetic complications: no     Last Vitals:  Vitals:   06/08/18 1140 06/08/18 1154  BP: 140/71   Pulse: 66 (!) 59  Resp: 20 18  Temp: 36.7 C 36.7 C  SpO2: 96%     Last Pain:  Vitals:   06/08/18 1154  TempSrc: Oral  PainSc:                  Jovita GammaKathryn L Fitzgerald

## 2018-06-08 NOTE — Discharge Instructions (Signed)
1)  The drugs that you have been given will stay in your system until tomorrow so for the       next 24 hours you should not: ° A. Drive an automobile ° B. Make any legal decisions ° C. Drink any alcoholic beverages ° °2)  You may resume your regular meals upon return home. ° °3)  A responsible adult must take you home.  Someone should stay with you for a few          hours, then be available by phone for the remainder of the treatment day. ° °4)  You May experience any of the following symptoms: ° Headache, Nausea and a dry mouth (due to the medications you were given),  temporary memory loss and some confusion, or sore muscles (a warm bath  should help this).  If you you experience any of these symptoms let us know on                your return visit. ° °5)  Report any of the following: any acute discomfort, severe headache, or temperature        greater than 100.5 F.   Also report any unusual redness, swelling, drainage, or pain         at your IV site. ° °  You may report Symptoms to:  ECT PROGRAM-  at [facility] °         Phone: [phone], ECT Department  °         or Dr. Clapac's office [phone] ° °6)  Your next ECT Treatment is Friday August 16  ° We will call 2 days prior to your scheduled appointment for arrival times. ° °7)  Nothing to eat or drink after midnight the night before your procedure. ° °8)  Take      With a sip of water the morning of your procedure. ° °9)  Other Instructions: Call [phone] to cancel the morning of your procedure due         to illness or emergency. ° °10) We will call within 72 hours to assess how you are feeling.  °
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
## 2018-06-27 ENCOUNTER — Telehealth: Payer: Self-pay | Admitting: *Deleted

## 2018-07-05 ENCOUNTER — Other Ambulatory Visit: Payer: Self-pay | Admitting: Psychiatry

## 2018-07-06 ENCOUNTER — Encounter
Admission: RE | Admit: 2018-07-06 | Discharge: 2018-07-06 | Disposition: A | Discharge status: Home / Self Care | Payer: Medicare Other | Source: Ambulatory Visit | Attending: Psychiatry | Admitting: Psychiatry

## 2018-07-06 ENCOUNTER — Ambulatory Visit: Payer: Self-pay | Admitting: Anesthesiology

## 2018-07-06 ENCOUNTER — Encounter: Payer: Self-pay | Admitting: Anesthesiology

## 2018-07-06 ENCOUNTER — Other Ambulatory Visit: Payer: Self-pay | Admitting: Psychiatry

## 2018-07-06 DIAGNOSIS — E785 Hyperlipidemia, unspecified: Secondary | ICD-10-CM | POA: Insufficient documentation

## 2018-07-06 DIAGNOSIS — G250 Essential tremor: Secondary | ICD-10-CM | POA: Diagnosis not present

## 2018-07-06 DIAGNOSIS — F319 Bipolar disorder, unspecified: Secondary | ICD-10-CM | POA: Diagnosis present

## 2018-07-06 DIAGNOSIS — Z8673 Personal history of transient ischemic attack (TIA), and cerebral infarction without residual deficits: Secondary | ICD-10-CM | POA: Diagnosis not present

## 2018-07-06 DIAGNOSIS — F315 Bipolar disorder, current episode depressed, severe, with psychotic features: Secondary | ICD-10-CM | POA: Diagnosis not present

## 2018-07-06 DIAGNOSIS — N400 Enlarged prostate without lower urinary tract symptoms: Secondary | ICD-10-CM | POA: Diagnosis not present

## 2018-07-06 DIAGNOSIS — E119 Type 2 diabetes mellitus without complications: Secondary | ICD-10-CM | POA: Diagnosis not present

## 2018-07-06 DIAGNOSIS — I10 Essential (primary) hypertension: Secondary | ICD-10-CM | POA: Insufficient documentation

## 2018-07-06 DIAGNOSIS — I471 Supraventricular tachycardia: Secondary | ICD-10-CM | POA: Diagnosis not present

## 2018-07-06 MED ORDER — SODIUM CHLORIDE 0.9 % IV SOLN
500.0000 mL | Freq: Once | INTRAVENOUS | Status: AC
  Administered 2018-07-06: 500 mL via INTRAVENOUS

## 2018-07-06 MED ORDER — SUCCINYLCHOLINE CHLORIDE 200 MG/10ML IV SOSY
PREFILLED_SYRINGE | INTRAVENOUS | Status: DC | PRN
  Administered 2018-07-06: 150 mg via INTRAVENOUS

## 2018-07-06 MED ORDER — GLYCOPYRROLATE PF 0.2 MG/ML IJ SOSY
PREFILLED_SYRINGE | INTRAMUSCULAR | Status: DC | PRN
  Administered 2018-07-06: .2 mg via INTRAVENOUS

## 2018-07-06 MED ORDER — OLANZAPINE 10 MG PO TABS: 10.0000 mg | ORAL_TABLET | Freq: Every day | ORAL | 5 refills | Status: DC

## 2018-07-06 MED ORDER — METHOHEXITAL SODIUM 100 MG/10ML IV SOSY
PREFILLED_SYRINGE | INTRAVENOUS | Status: DC | PRN
  Administered 2018-07-06: 80 mg via INTRAVENOUS

## 2018-07-06 MED ORDER — LABETALOL HCL 5 MG/ML IV SOLN
INTRAVENOUS | Status: DC | PRN
  Administered 2018-07-06: 10 mg via INTRAVENOUS
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
## 2018-07-06 NOTE — Anesthesia Postprocedure Evaluation (Signed)
Anesthesia Post Note  Patient: Kyle [name]  Procedure(s) Performed: ECT TX  Patient location during evaluation: PACU Anesthesia Type: General Level of consciousness: awake and alert Pain management: pain level controlled Vital Signs Assessment: post-procedure vital signs reviewed and stable Respiratory status: spontaneous breathing, nonlabored ventilation and respiratory function stable Cardiovascular status: blood pressure returned to baseline and stable (Has periods of Afib, PVCs on monitor - has had these prior ECT. VVS, feels well) Postop Assessment: no apparent nausea or vomiting Anesthetic complications: no     Last Vitals:  Vitals:   07/06/18 1234 07/06/18 1235  BP: 135/63   Pulse: (!) 50 82  Resp: 19 (!) 21  Temp:    SpO2: 97% 94%    Last Pain:  Vitals:   07/06/18 1234  TempSrc:   PainSc: 0-No pain                 Christia ReadingScott T Vivan Agostino

## 2018-07-06 NOTE — Anesthesia Post-op Follow-up Note (Signed)
Anesthesia QCDR form completed.        

## 2018-07-06 NOTE — Discharge Instructions (Addendum)
1)  The drugs that you have been given will stay in your system until tomorrow so for the       next 24 hours you should not: ° A. Drive an automobile ° B. Make any legal decisions ° C. Drink any alcoholic beverages ° °2)  You may resume your regular meals upon return home. ° °3)  A responsible adult must take you home.  Someone should stay with you for a few          hours, then be available by phone for the remainder of the treatment day. ° °4)  You May experience any of the following symptoms: ° Headache, Nausea and a dry mouth (due to the medications you were given),  temporary memory loss and some confusion, or sore muscles (a warm bath  should help this).  If you you experience any of these symptoms let us know on                your return visit. ° °5)  Report any of the following: any acute discomfort, severe headache, or temperature        greater than 100.5 F.   Also report any unusual redness, swelling, drainage, or pain         at your IV site. ° °  You may report Symptoms to:  ECT PROGRAM- [place] [facility] °         Phone: [phone], ECT Department  °         or Dr. Clapac's office [phone] ° °6)  Your next ECT Treatment is  Wednesday September 18  ° We will call 2 days prior to your scheduled appointment for arrival times. ° °7)  Nothing to eat or drink after midnight the night before your procedure. ° °8)  Take   With a sip of water the morning of your procedure. ° °9)  Other Instructions: Call [phone] to cancel the morning of your procedure due         to illness or emergency. ° °10) We will call within 72 hours to assess how you are feeling.  °

## 2018-07-06 NOTE — Progress Notes (Signed)
Patient sinus brady in the 50s upon arrival to PACU. He then went into afib for approximately 10 minutes. Per Rodell PernaPatrice, RN, this has occurred before after ECT. Notified Dr. Providence LaniusHowell. During our conversation, patient returned to SB w/ occasional PVCs. Per Dr. Providence LaniusHowell, continue to monitor and call back if patient goes back in to afib.

## 2018-07-06 NOTE — Procedures (Signed)
ECT SERVICES Physician's Interval Evaluation & Treatment Note  Patient Identification: Kyle MulliganMarvin Daniel Nooney MRN:  [id number] Date of Evaluation:  07/06/2018 TX #: 22  MADRS: 18  MMSE: 30  P.E. Findings:  Has gained some weight  Psychiatric Interval Note:  Mood good stable no sign of psychosis not depressed not manic not agitated  Subjective:  Patient is a 72 y.o. male seen for evaluation for Electroconvulsive Therapy. No complaint other than awareness of his weight gain  Treatment Summary:   [x]   Right Unilateral             []  Bilateral   % Energy : 0.3 ms 100%   Impedance: 1710 ohms  Seizure Energy Index: 461 V squared  Postictal Suppression Index: 46%  Seizure Concordance Index: 78%  Medications  Pre Shock: Robinul 0.2 mg labetalol 10 mg Brevital 80 mg succinylcholine 150 mg  Post Shock:    Seizure Duration: EEG 18 seconds computer read the EEG at 26 seconds although I think it probably went out to 39   Comments: Low amplitude seizure but still of adequate length.  I think that [name] continues to benefit from continuing ECT and we will see him back in 4 weeks.  After conversation with him I will also slightly decrease his dose of olanzapine to 10 mg.  Lungs:  [x]   Clear to auscultation               []  Other:   Heart:    [x]   Regular rhythm             []  irregular rhythm    [x]   Previous H&P reviewed, patient examined and there are NO CHANGES                 []   Previous H&P reviewed, patient examined and there are changes noted.   Mordecai RasmussenJohn Clapacs, MD 8/14/201911:27 AM
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
## 2018-07-06 NOTE — Transfer of Care (Signed)
Immediate Anesthesia Transfer of Care Note  Patient: Kyle [name]  Procedure(s) Performed: ECT TX  Patient Location: PACU  Anesthesia Type:General  Level of Consciousness: awake, alert  and oriented  Airway & Oxygen Therapy: Patient Spontanous Breathing and Patient connected to face mask oxygen  Post-op Assessment: Report given to RN and Post -op Vital signs reviewed and stable  Post vital signs: Reviewed and stable  Last Vitals:  Vitals Value Taken Time  BP    Temp    Pulse 56 07/06/2018 11:49 AM  Resp    SpO2 98 % 07/06/2018 11:49 AM  Vitals shown include unvalidated device data.  Last Pain:  Vitals:   07/06/18 0903  TempSrc:   PainSc: 0-No pain         Complications: No apparent anesthesia complications

## 2018-07-08 ENCOUNTER — Other Ambulatory Visit: Payer: Self-pay | Admitting: Psychiatry

## 2018-08-08 ENCOUNTER — Telehealth: Payer: Self-pay | Admitting: *Deleted

## 2018-08-10 ENCOUNTER — Other Ambulatory Visit: Payer: Self-pay | Admitting: Psychiatry

## 2019-08-28 ENCOUNTER — Observation Stay (HOSPITAL_COMMUNITY): Payer: Medicare Other

## 2019-08-28 ENCOUNTER — Other Ambulatory Visit: Payer: Self-pay

## 2019-08-28 ENCOUNTER — Emergency Department (HOSPITAL_COMMUNITY): Payer: Medicare Other

## 2019-08-28 ENCOUNTER — Encounter (HOSPITAL_COMMUNITY): Payer: Self-pay

## 2019-08-28 ENCOUNTER — Inpatient Hospital Stay (HOSPITAL_COMMUNITY)
Admission: EM | Admit: 2019-08-28 | Discharge: 2019-08-30 | DRG: 312 | Disposition: A | Discharge status: Home Health | Payer: Medicare Other | Attending: Family Medicine | Admitting: Family Medicine

## 2019-08-28 DIAGNOSIS — E669 Obesity, unspecified: Secondary | ICD-10-CM | POA: Diagnosis present

## 2019-08-28 DIAGNOSIS — Z20828 Contact with and (suspected) exposure to other viral communicable diseases: Secondary | ICD-10-CM | POA: Diagnosis present

## 2019-08-28 DIAGNOSIS — I679 Cerebrovascular disease, unspecified: Secondary | ICD-10-CM | POA: Diagnosis present

## 2019-08-28 DIAGNOSIS — R55 Syncope and collapse: Principal | ICD-10-CM

## 2019-08-28 DIAGNOSIS — Z8673 Personal history of transient ischemic attack (TIA), and cerebral infarction without residual deficits: Secondary | ICD-10-CM

## 2019-08-28 DIAGNOSIS — E785 Hyperlipidemia, unspecified: Secondary | ICD-10-CM | POA: Diagnosis present

## 2019-08-28 DIAGNOSIS — Z6838 Body mass index (BMI) 38.0-38.9, adult: Secondary | ICD-10-CM

## 2019-08-28 DIAGNOSIS — I10 Essential (primary) hypertension: Secondary | ICD-10-CM | POA: Diagnosis present

## 2019-08-28 DIAGNOSIS — Z79890 Hormone replacement therapy (postmenopausal): Secondary | ICD-10-CM

## 2019-08-28 DIAGNOSIS — R42 Dizziness and giddiness: Secondary | ICD-10-CM | POA: Diagnosis present

## 2019-08-28 DIAGNOSIS — R296 Repeated falls: Secondary | ICD-10-CM | POA: Diagnosis present

## 2019-08-28 DIAGNOSIS — Z86711 Personal history of pulmonary embolism: Secondary | ICD-10-CM | POA: Diagnosis present

## 2019-08-28 DIAGNOSIS — Z79899 Other long term (current) drug therapy: Secondary | ICD-10-CM

## 2019-08-28 DIAGNOSIS — F3130 Bipolar disorder, current episode depressed, mild or moderate severity, unspecified: Secondary | ICD-10-CM | POA: Diagnosis present

## 2019-08-28 DIAGNOSIS — W19XXXA Unspecified fall, initial encounter: Secondary | ICD-10-CM | POA: Diagnosis present

## 2019-08-28 DIAGNOSIS — G250 Essential tremor: Secondary | ICD-10-CM | POA: Diagnosis present

## 2019-08-28 DIAGNOSIS — R778 Other specified abnormalities of plasma proteins: Secondary | ICD-10-CM

## 2019-08-28 DIAGNOSIS — N401 Enlarged prostate with lower urinary tract symptoms: Secondary | ICD-10-CM | POA: Diagnosis present

## 2019-08-28 DIAGNOSIS — R0602 Shortness of breath: Secondary | ICD-10-CM

## 2019-08-28 DIAGNOSIS — R2681 Unsteadiness on feet: Secondary | ICD-10-CM | POA: Diagnosis present

## 2019-08-28 DIAGNOSIS — R7989 Other specified abnormal findings of blood chemistry: Secondary | ICD-10-CM

## 2019-08-28 DIAGNOSIS — Z9181 History of falling: Secondary | ICD-10-CM

## 2019-08-28 DIAGNOSIS — E1140 Type 2 diabetes mellitus with diabetic neuropathy, unspecified: Secondary | ICD-10-CM | POA: Diagnosis present

## 2019-08-28 LAB — CBC
HCT: 37.3 % — ABNORMAL LOW (ref 39.0–52.0)
Hemoglobin: 12.7 g/dL — ABNORMAL LOW (ref 13.0–17.0)
MCH: 32.6 pg (ref 26.0–34.0)
MCHC: 34 g/dL (ref 30.0–36.0)
MCV: 95.9 fL (ref 80.0–100.0)
Platelets: 179 10*3/uL (ref 150–400)
RBC: 3.89 MIL/uL — ABNORMAL LOW (ref 4.22–5.81)
RDW: 12.5 % (ref 11.5–15.5)
WBC: 9.9 10*3/uL (ref 4.0–10.5)
nRBC: 0 % (ref 0.0–0.2)

## 2019-08-28 LAB — TROPONIN I (HIGH SENSITIVITY)
Troponin I (High Sensitivity): 17 ng/L (ref <18)
Troponin I (High Sensitivity): 21 ng/L — ABNORMAL HIGH (ref <18)
Troponin I (High Sensitivity): 18 ng/L — ABNORMAL HIGH (ref <18)

## 2019-08-28 LAB — COMPREHENSIVE METABOLIC PANEL
ALT: 10 U/L (ref 0–44)
AST: 14 U/L — ABNORMAL LOW (ref 15–41)
Albumin: 3.9 g/dL (ref 3.5–5.0)
Alkaline Phosphatase: 44 U/L (ref 38–126)
Anion gap: 8 (ref 5–15)
BUN: 12 mg/dL (ref 8–23)
CO2: 27 mmol/L (ref 22–32)
Calcium: 9.1 mg/dL (ref 8.9–10.3)
Chloride: 108 mmol/L (ref 98–111)
Creatinine, Ser: 0.75 mg/dL (ref 0.61–1.24)
GFR calc Af Amer: >60 mL/min (ref >60)
GFR calc non Af Amer: >60 mL/min (ref >60)
Glucose, Bld: 110 mg/dL — ABNORMAL HIGH (ref 70–99)
Potassium: 3.8 mmol/L (ref 3.5–5.1)
Sodium: 143 mmol/L (ref 135–145)
Total Bilirubin: 0.5 mg/dL (ref 0.3–1.2)
Total Protein: 6.7 g/dL (ref 6.5–8.1)

## 2019-08-28 LAB — GLUCOSE, CAPILLARY: Glucose-Capillary: 151 mg/dL — ABNORMAL HIGH (ref 70–99)

## 2019-08-28 LAB — LACTIC ACID, PLASMA: Lactic Acid, Venous: 1.8 mmol/L (ref 0.5–1.9)

## 2019-08-28 LAB — CK: Total CK: 70 U/L (ref 49–397)

## 2019-08-28 MED ORDER — SODIUM CHLORIDE 0.9 % IV SOLN: 250.0000 mL | INTRAVENOUS | Status: DC | PRN

## 2019-08-28 MED ORDER — INSULIN ASPART 100 UNIT/ML ~~LOC~~ SOLN: 0.0000 [IU] | Freq: Every day | SUBCUTANEOUS | Status: DC

## 2019-08-28 MED ORDER — ACETAMINOPHEN 650 MG RE SUPP: 650.0000 mg | Freq: Four times a day (QID) | RECTAL | Status: DC | PRN

## 2019-08-28 MED ORDER — INSULIN ASPART 100 UNIT/ML ~~LOC~~ SOLN
0.0000 [IU] | Freq: Three times a day (TID) | SUBCUTANEOUS | Status: DC
  Administered 2019-08-29: 12:00:00 1 [IU] via SUBCUTANEOUS

## 2019-08-28 MED ORDER — INSULIN ASPART 100 UNIT/ML ~~LOC~~ SOLN: 0.0000 [IU] | Freq: Three times a day (TID) | SUBCUTANEOUS | Status: DC

## 2019-08-28 MED ORDER — PROPRANOLOL HCL ER 80 MG PO CP24
80.0000 mg | ORAL_CAPSULE | Freq: Every day | ORAL | Status: DC
  Administered 2019-08-29: 11:00:00 80 mg via ORAL
  Filled 2019-08-28 (×4): qty 1

## 2019-08-28 MED ORDER — SIMVASTATIN 10 MG PO TABS
10.0000 mg | ORAL_TABLET | Freq: Every day | ORAL | Status: DC
  Administered 2019-08-29: 18:00:00 10 mg via ORAL
  Filled 2019-08-28: qty 1

## 2019-08-28 MED ORDER — ENOXAPARIN SODIUM 60 MG/0.6ML ~~LOC~~ SOLN
50.0000 mg | SUBCUTANEOUS | Status: DC
  Administered 2019-08-28 – 2019-08-29 (×2): 50 mg via SUBCUTANEOUS
  Filled 2019-08-28 (×2): qty 0.6

## 2019-08-28 MED ORDER — SODIUM CHLORIDE 0.9% FLUSH: 3.0000 mL | INTRAVENOUS | Status: DC | PRN

## 2019-08-28 MED ORDER — SODIUM CHLORIDE 0.9 % IV BOLUS
1000.0000 mL | Freq: Once | INTRAVENOUS | Status: AC
  Administered 2019-08-28: 1000 mL via INTRAVENOUS

## 2019-08-28 MED ORDER — FINASTERIDE 5 MG PO TABS
5.0000 mg | ORAL_TABLET | Freq: Every day | ORAL | Status: DC
  Administered 2019-08-29 – 2019-08-30 (×2): 5 mg via ORAL
  Filled 2019-08-28 (×2): qty 1

## 2019-08-28 MED ORDER — ACETAMINOPHEN 325 MG PO TABS: 650.0000 mg | ORAL_TABLET | Freq: Four times a day (QID) | ORAL | Status: DC | PRN

## 2019-08-28 MED ORDER — OLANZAPINE 5 MG PO TABS: 10.0000 mg | ORAL_TABLET | Freq: Every day | ORAL | Status: DC

## 2019-08-28 MED ORDER — FLUOXETINE HCL 20 MG PO CAPS
20.0000 mg | ORAL_CAPSULE | Freq: Every day | ORAL | Status: DC
  Administered 2019-08-29: 20 mg via ORAL
  Filled 2019-08-28: qty 1

## 2019-08-28 MED ORDER — MELATONIN 5 MG PO TABS
20.0000 mg | ORAL_TABLET | Freq: Every day | ORAL | Status: DC
  Filled 2019-08-28 (×2): qty 4

## 2019-08-28 MED ORDER — OLANZAPINE 5 MG PO TABS
15.0000 mg | ORAL_TABLET | Freq: Every day | ORAL | Status: DC
  Administered 2019-08-29: 21:00:00 15 mg via ORAL
  Filled 2019-08-28: qty 3

## 2019-08-28 NOTE — Progress Notes (Addendum)
Patient has money $720 (6-$100, 6- $20) a check book, money order  [phone]) were locked up with security and verified with nurse. Patient states sister will come and get belongings tomorrow.  Sister name is Sander Radon.
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
## 2019-08-29 ENCOUNTER — Observation Stay (HOSPITAL_BASED_OUTPATIENT_CLINIC_OR_DEPARTMENT_OTHER): Payer: Medicare Other

## 2019-08-29 ENCOUNTER — Observation Stay (HOSPITAL_COMMUNITY): Payer: Medicare Other

## 2019-08-29 DIAGNOSIS — R296 Repeated falls: Secondary | ICD-10-CM | POA: Diagnosis present

## 2019-08-29 DIAGNOSIS — I10 Essential (primary) hypertension: Secondary | ICD-10-CM

## 2019-08-29 DIAGNOSIS — I340 Nonrheumatic mitral (valve) insufficiency: Secondary | ICD-10-CM

## 2019-08-29 DIAGNOSIS — W19XXXA Unspecified fall, initial encounter: Secondary | ICD-10-CM | POA: Diagnosis not present

## 2019-08-29 DIAGNOSIS — I361 Nonrheumatic tricuspid (valve) insufficiency: Secondary | ICD-10-CM

## 2019-08-29 DIAGNOSIS — R55 Syncope and collapse: Secondary | ICD-10-CM | POA: Diagnosis not present

## 2019-08-29 LAB — ECHOCARDIOGRAM COMPLETE
Height: 68 in
Weight: 3978.86 oz

## 2019-08-29 LAB — GLUCOSE, CAPILLARY
Glucose-Capillary: 96 mg/dL (ref 70–99)
Glucose-Capillary: 135 mg/dL — ABNORMAL HIGH (ref 70–99)
Glucose-Capillary: 118 mg/dL — ABNORMAL HIGH (ref 70–99)
Glucose-Capillary: 107 mg/dL — ABNORMAL HIGH (ref 70–99)

## 2019-08-29 LAB — COMPREHENSIVE METABOLIC PANEL
ALT: 10 U/L (ref 0–44)
AST: 13 U/L — ABNORMAL LOW (ref 15–41)
Albumin: 3.4 g/dL — ABNORMAL LOW (ref 3.5–5.0)
Alkaline Phosphatase: 40 U/L (ref 38–126)
Anion gap: 6 (ref 5–15)
BUN: 9 mg/dL (ref 8–23)
CO2: 27 mmol/L (ref 22–32)
Calcium: 8.8 mg/dL — ABNORMAL LOW (ref 8.9–10.3)
Chloride: 106 mmol/L (ref 98–111)
Creatinine, Ser: 0.58 mg/dL — ABNORMAL LOW (ref 0.61–1.24)
GFR calc Af Amer: >60 mL/min (ref >60)
GFR calc non Af Amer: >60 mL/min (ref >60)
Glucose, Bld: 101 mg/dL — ABNORMAL HIGH (ref 70–99)
Potassium: 3.6 mmol/L (ref 3.5–5.1)
Sodium: 139 mmol/L (ref 135–145)
Total Bilirubin: 0.6 mg/dL (ref 0.3–1.2)
Total Protein: 6 g/dL — ABNORMAL LOW (ref 6.5–8.1)

## 2019-08-29 LAB — CBC
HCT: 36 % — ABNORMAL LOW (ref 39.0–52.0)
Hemoglobin: 12.1 g/dL — ABNORMAL LOW (ref 13.0–17.0)
MCH: 32.5 pg (ref 26.0–34.0)
MCHC: 33.6 g/dL (ref 30.0–36.0)
MCV: 96.8 fL (ref 80.0–100.0)
Platelets: 203 10*3/uL (ref 150–400)
RBC: 3.72 MIL/uL — ABNORMAL LOW (ref 4.22–5.81)
RDW: 12.6 % (ref 11.5–15.5)
WBC: 8.1 10*3/uL (ref 4.0–10.5)
nRBC: 0 % (ref 0.0–0.2)

## 2019-08-29 LAB — SARS CORONAVIRUS 2 (TAT 6-24 HRS): SARS Coronavirus 2: NEGATIVE

## 2019-08-29 LAB — HEMOGLOBIN A1C
Hgb A1c MFr Bld: 5.5 % (ref 4.8–5.6)
Mean Plasma Glucose: 111.15 mg/dL

## 2019-08-29 MED ORDER — MELATONIN 3 MG PO TABS
18.0000 mg | ORAL_TABLET | Freq: Every day | ORAL | Status: DC
  Administered 2019-08-29 (×2): 18 mg via ORAL
  Filled 2019-08-29: qty 6
  Filled 2019-08-29 (×2): qty 3.5
  Filled 2019-08-29: qty 6

## 2019-08-29 MED ORDER — LABETALOL HCL 5 MG/ML IV SOLN: 10.0000 mg | INTRAVENOUS | Status: DC | PRN

## 2019-08-29 NOTE — Plan of Care (Signed)
  Problem: Education: Goal: Knowledge of General Education information will improve Description: Including pain rating scale, medication(s)/side effects and non-pharmacologic comfort measures Outcome: Progressing   Problem: Health Behavior/Discharge Planning: Goal: Ability to manage health-related needs will improve Outcome: Progressing   Problem: Clinical Measurements: Goal: Ability to maintain clinical measurements within normal limits will improve Outcome: Progressing Goal: Will remain free from infection Outcome: Progressing Goal: Diagnostic test results will improve Outcome: Progressing Goal: Respiratory complications will improve Outcome: Progressing Goal: Cardiovascular complication will be avoided Outcome: Progressing   Problem: Activity: Goal: Risk for activity intolerance will decrease Outcome: Progressing   Problem: Nutrition: Goal: Adequate nutrition will be maintained Outcome: Progressing   Problem: Coping: Goal: Level of anxiety will decrease Outcome: Progressing   Problem: Elimination: Goal: Will not experience complications related to bowel motility Outcome: Progressing Goal: Will not experience complications related to urinary retention Outcome: Progressing   Problem: Pain Managment: Goal: General experience of comfort will improve Outcome: Progressing   Problem: Safety: Goal: Ability to remain free from injury will improve Outcome: Progressing   Problem: Skin Integrity: Goal: Risk for impaired skin integrity will decrease Outcome: Progressing   Problem: Acute Rehab PT Goals(only PT should resolve) Goal: Pt Will Go Supine/Side To Sit Outcome: Progressing Flowsheets (Taken 08/29/2019 1438) Pt will go Supine/Side to Sit: with min guard assist Goal: Patient Will Transfer Sit To/From Stand Outcome: Progressing Flowsheets (Taken 08/29/2019 1438) Patient will transfer sit to/from stand: with min guard assist Goal: Pt Will Transfer Bed To  Chair/Chair To Bed Outcome: Progressing Flowsheets (Taken 08/29/2019 1438) Pt will Transfer Bed to Chair/Chair to Bed: min guard assist Goal: Pt Will Ambulate Outcome: Progressing Flowsheets (Taken 08/29/2019 1438) Pt will Ambulate:  50 feet  with minimal assist  with rolling walker   2:41 PM, 08/29/19 Margie Billet PT, DPT Physical Therapist at [facility]

## 2019-08-29 NOTE — TOC Initial Note (Signed)
Transition of Care [facility]) - Initial/Assessment Note    Patient Details  Name: Kyle [name] MRN: [id number] Date of Birth: [birth year]  [facility]) CM/SW Contact:    Boneta Lucks, RN Phone Number: 08/29/2019, 3:36 PM  Clinical Narrative:   Patient admitted for syncope, patient lives alone, with a sister as his support system.  Patient drives, but states he is so weak and afraid of falling his driving days maybe over. PT is recommending SNF.  Patient wanting to go to [facility] Explained they are assisted living. He applied for Medicaid in the past. He does not own property, and has $4000 in savings. Discussed long term planning with patient. SNF choices are to stay in a local facility. If patient improves he will go home with [facility] and wants SW to apply for medicaid.  TOC to follow                 Expected Discharge Plan: [place] Barriers to Discharge: Continued Medical Work up   Patient Goals and CMS Choice Patient states their goals for this hospitalization and ongoing recovery are:: to go to SNF. " I do not feel strong enough to go home, I will fall if I walk." CMS Medicare.gov Compare Post Acute Care list provided to:: Patient Choice offered to / list presented to : Patient  Expected Discharge Plan and Services Expected Discharge Plan: [place]       Prior Living Arrangements/Services   Lives with:: Self Patient language and need for interpreter reviewed:: Yes Do you feel safe going back to the place where you live?: No   to weak, fear of falling.  Need for Family Participation in Patient Care: Yes (Comment) Care giver support system in place?: Yes (comment) Current home services: DME Criminal Activity/Legal Involvement Pertinent to Current Situation/Hospitalization: No - Comment as needed  Activities of Daily Living Home Assistive Devices/Equipment: None ADL Screening (condition at time of admission) Patient's cognitive  ability adequate to safely complete daily activities?: Yes Is the patient deaf or have difficulty hearing?: No Does the patient have difficulty seeing, even when wearing glasses/contacts?: No Does the patient have difficulty concentrating, remembering, or making decisions?: No Patient able to express need for assistance with ADLs?: Yes Does the patient have difficulty dressing or bathing?: No Independently performs ADLs?: Yes (appropriate for developmental age) Does the patient have difficulty walking or climbing stairs?: No Weakness of Legs: None Weakness of Arms/Hands: None  Permission Sought/Granted Permission sought to share information with : Case Manager, Customer service manager Permission granted to share information with : Yes, Verbal Permission Granted           Permission granted to share info w Contact Information: SNF  Emotional Assessment     Affect (typically observed): Accepting Orientation: : Oriented to Self, Oriented to Place, Oriented to  Time, Oriented to Situation Alcohol / Substance Use: Not Applicable Psych Involvement: No (comment)  Admission diagnosis:  Syncope [R55] SOB (shortness of breath) [R06.02] Patient Active Problem List   Diagnosis Date Noted  . Syncope 08/28/2019  . BPH (benign prostatic hyperplasia) 03/17/2018  . Dyslipidemia 03/17/2018  . Bipolar I disorder, current or most recent episode manic, with psychotic features ([place]) 03/16/2018  . History of stroke 03/16/2018  . Current use of anticoagulant therapy 03/16/2018  . History of pulmonary embolism 03/16/2018  . Bipolar 1 disorder, depressed ([place]) 02/03/2017  . Catatonia 01/26/2017  . Diabetes ([place]) 01/26/2017  . Suicide attempt ([place])   .  SVT (supraventricular tachycardia) (HCC)   . Noncompliance 09/26/2015  . RLS (restless legs syndrome) 08/08/2015  . Peripheral neuropathy (HCC) 08/08/2015  . Cerebrovascular disease 08/07/2015  . Bipolar I disorder depressed with  melancholic features (HCC) 08/01/2015  . Essential tremor 08/01/2015  . Hypertension 04/17/2015   PCP:  Patient, No Pcp Per Pharmacy:   [facility], [place]. - [place], [place] - [address] [place] [address] Phone: [phone] Fax: [phone]    Readmission Risk Interventions No flowsheet data found.

## 2019-08-29 NOTE — Evaluation (Signed)
Physical Therapy Evaluation Patient Details Name: Kyle [name] MRN: [id number] DOB: [birth year] Today's Date: 08/29/2019   History of Present Illness  Kyle [name]  is a 73 y.o. male, w bipolar, hypertension, hyperlipidemia, Dm2, w neuropathy, SVT, who presents with c/o syncope.  Pt had his car apparently not work, and started to walk home when he had syncope.  Was found on sidewalk by police.     Clinical Impression  Patient is very fearful of falling and demonstrates limited activity tolerance during today's session. Patient is a moderate fall risk and shows unsteadiness in standing. See below for assist levels needed.  He demonstrates poor standing balance and requires use of cane to ambulate. He barely touched cane to ground and was shaky/unsteady during ambulation. Patient does not feel as if he can take care of himself and has limited overall mobility. Plan: Patient will benefit from continued physical therapy in hospital and recommended venue below to to increase strength, endurance, and balance for safe ADL's and gait.     Follow Up Recommendations SNF    Equipment Recommendations  Rolling walker with 5" wheels    Recommendations for Other Services       Precautions / Restrictions Precautions Precautions: Fall Restrictions Weight Bearing Restrictions: No      Mobility  Bed Mobility Overal bed mobility: Needs Assistance Bed Mobility: Supine to Sit     Supine to sit: Min assist;Min guard     General bed mobility comments: increased time, labored  Transfers Overall transfer level: Needs assistance Equipment used: Rolling walker (2 wheeled) Transfers: Sit to/from Stand Sit to Stand: Min assist         General transfer comment: patient is fearful of falling upon standing, labored and respiratory rate/depth increases  Ambulation/Gait Ambulation/Gait assistance: Mod assist Gait Distance (Feet): 10 Feet Assistive device: Rolling walker (2 wheeled)   Gait  velocity: slow   General Gait Details: decreased step/stride length, slow and labored, fearful of falling and overexertion, reduced activity tolerance  Stairs            Wheelchair Mobility    Modified Rankin (Stroke Patients Only)       Balance Overall balance assessment: Needs assistance Sitting-balance support: Feet supported;No upper extremity supported Sitting balance-Leahy Scale: Good Sitting balance - Comments: able to maintain upright posture     Standing balance-Leahy Scale: Poor Standing balance comment: requires UE support on walker, pain in feet upon standing                             Pertinent Vitals/Pain Pain Assessment: No/denies pain    Home Living Family/patient expects to be discharged to:: Private residence Living Arrangements: Alone Available Help at Discharge: Friend(s);Available PRN/intermittently Type of Home: Apartment(2nd floor) Home Access: Stairs to enter;Elevator       Home Equipment: Gilmer Mor - single point      Prior Function Level of Independence: Needs assistance   Gait / Transfers Assistance Needed: household ambulator with use of SPC  ADL's / Homemaking Assistance Needed: Friend assisted        Hand Dominance        Extremity/Trunk Assessment   Upper Extremity Assessment Upper Extremity Assessment: Generalized weakness    Lower Extremity Assessment Lower Extremity Assessment: Generalized weakness    Cervical / Trunk Assessment Cervical / Trunk Assessment: Kyphotic  Communication   Communication: No difficulties  Cognition   Behavior During Therapy: WFL for tasks assessed/performed Overall Cognitive  Status: Within Functional Limits for tasks assessed                                        General Comments      Exercises     Assessment/Plan    PT Assessment Patient needs continued PT services  PT Problem List Decreased strength;Decreased activity tolerance;Decreased  balance;Decreased mobility;Cardiopulmonary status limiting activity       PT Treatment Interventions Gait training;Stair training;Functional mobility training;Therapeutic activities;Therapeutic exercise;Balance training;Patient/family education    PT Goals (Current goals can be found in the Care Plan section)  Acute Rehab PT Goals Patient Stated Goal: Patient wants to go to assisted living PT Goal Formulation: With patient Time For Goal Achievement: 09/12/19 Potential to Achieve Goals: Fair    Frequency Min 3X/week   Barriers to discharge        Co-evaluation               AM-PAC PT "6 Clicks" Mobility  Outcome Measure Help needed turning from your back to your side while in a flat bed without using bedrails?: A Little Help needed moving from lying on your back to sitting on the side of a flat bed without using bedrails?: A Little Help needed moving to and from a bed to a chair (including a wheelchair)?: A Little Help needed standing up from a chair using your arms (e.g., wheelchair or bedside chair)?: A Lot Help needed to walk in hospital room?: A Lot Help needed climbing 3-5 steps with a railing? : A Lot 6 Click Score: 15    End of Session   Activity Tolerance: Patient limited by fatigue;Patient tolerated treatment well Patient left: in chair;with call bell/phone within reach Nurse Communication: Mobility status PT Visit Diagnosis: Unsteadiness on feet (R26.81);Other abnormalities of gait and mobility (R26.89);Muscle weakness (generalized) (M62.81)    Time: 4680-3212 PT Time Calculation (min) (ACUTE ONLY): 20 min   Charges:   PT Evaluation $PT Eval Moderate Complexity: 1 Mod PT Treatments $Therapeutic Activity: 8-22 mins       2:32 PM, 08/29/19 Margie Billet PT, DPT Physical Therapist at [facility]

## 2019-08-29 NOTE — NC FL2 (Signed)
[place] MEDICAID FL2 LEVEL OF CARE SCREENING TOOL     IDENTIFICATION  Patient Name: Kyle [name] Birthdate: 02-22-1946 Sex: male Admission Date (Current Location): 08/28/2019  [facility] and [place] Number:  [employer] and Address:  [facility],  [address], [place] [address]      Provider Number: [id number]  Attending Physician Name and Address:  Shon Hale, MD  Relative Name and Phone Number:  Anthoney Harada - sister  [phone]    Current Level of Care: Hospital Recommended Level of Care: Skilled Nursing Facility Prior Approval Number:    Date Approved/Denied:   PASRR Number:    Discharge Plan: SNF    Current Diagnoses: Patient Active Problem List   Diagnosis Date Noted  . Falls/very unsteady gait 08/29/2019  . Syncope 08/28/2019  . BPH (benign prostatic hyperplasia) 03/17/2018  . Dyslipidemia 03/17/2018  . Bipolar I disorder, current or most recent episode manic, with psychotic features (HCC) 03/16/2018  . History of stroke 03/16/2018  . Current use of anticoagulant therapy 03/16/2018  . History of pulmonary embolism 03/16/2018  . Bipolar 1 disorder, depressed (HCC) 02/03/2017  . Catatonia 01/26/2017  . Diabetes (HCC) 01/26/2017  . Suicide attempt (HCC)   . SVT (supraventricular tachycardia) (HCC)   . Noncompliance 09/26/2015  . RLS (restless legs syndrome) 08/08/2015  . Peripheral neuropathy (HCC) 08/08/2015  . Cerebrovascular disease 08/07/2015  . Bipolar I disorder depressed with melancholic features (HCC) 08/01/2015  . Essential tremor 08/01/2015  . Hypertension 04/17/2015    Orientation RESPIRATION BLADDER Height & Weight     Self, Time, Situation, Place  Normal Continent Weight: 112.8 kg Height:  5\' 8"  (172.7 cm)  BEHAVIORAL SYMPTOMS/MOOD NEUROLOGICAL BOWEL NUTRITION STATUS      Continent Diet(cardiac diet)  AMBULATORY STATUS COMMUNICATION OF NEEDS Skin   Extensive Assist Verbally Normal                     Personal Care Assistance Level of Assistance  Bathing, Feeding, Dressing Bathing Assistance: Limited assistance Feeding assistance: Independent Dressing Assistance: Limited assistance     Functional Limitations Info  Sight, Hearing, Speech Sight Info: Adequate Hearing Info: Adequate Speech Info: Adequate    SPECIAL CARE FACTORS FREQUENCY  PT (By licensed PT)     PT Frequency: 5 times a week              Contractures Contractures Info: Not present    Additional Factors Info  Code Status, Allergies Code Status Info: FULL Allergies Info: NKDA           Current Medications (08/29/2019):  This is the current hospital active medication list Current Facility-Administered Medications  Medication Dose Route Frequency Provider Last Rate Last Dose  . 0.9 %  sodium chloride infusion  250 mL Intravenous PRN 10/29/2019, MD      . acetaminophen (TYLENOL) tablet 650 mg  650 mg Oral Q6H PRN Pearson Grippe, MD       Or  . acetaminophen (TYLENOL) suppository 650 mg  650 mg Rectal Q6H PRN Pearson Grippe, MD      . enoxaparin (LOVENOX) injection 50 mg  50 mg Subcutaneous Q24H Pearson Grippe, MD   50 mg at 08/28/19 2222  . finasteride (PROSCAR) tablet 5 mg  5 mg Oral Daily 2223, MD   5 mg at 08/29/19 1041  . FLUoxetine (PROZAC) capsule 20 mg  20 mg Oral QHS 10/29/19, MD      . insulin aspart (  novoLOG) injection 0-5 Units  0-5 Units Subcutaneous QHS Jani Gravel, MD      . insulin aspart (novoLOG) injection 0-9 Units  0-9 Units Subcutaneous TID WC Jani Gravel, MD   1 Units at 08/29/19 1214  . Melatonin TABS 18 mg  18 mg Oral QHS Reubin Milan, MD   18 mg at 08/29/19 0216  . OLANZapine (ZYPREXA) tablet 15 mg  15 mg Oral QHS Jani Gravel, MD      . propranolol ER (INDERAL LA) 24 hr capsule 80 mg  80 mg Oral Daily Jani Gravel, MD   80 mg at 08/29/19 1041  . simvastatin (ZOCOR) tablet 10 mg  10 mg Oral q1800 Jani Gravel, MD      . sodium chloride flush (NS) 0.9 % injection 3 mL  3  mL Intravenous PRN Jani Gravel, MD         Discharge Medications: Please see discharge summary for a list of discharge medications.  Relevant Imaging Results:  Relevant Lab Results:   Additional Information SS [id number]  Boneta Lucks, RN

## 2019-08-29 NOTE — Progress Notes (Signed)
Nursing Home Choices:   [facility] [address] [place], [place] [address] [phone]   Add [facility] My Favorites- Lauris Poag in a new window 5 out of 5 starsfootnote Much Above Average 5 out of 5 starsfootnote Much Above Average 3 out of 5 starsfootnote Average 3 out of 5 starsfootnote Average 2.9 [facility] [address] [place], [place] [address] [phone]   Add [facility] My Favorites- Opens in a new window 2 out of 5 starsfootnote Below Average 2 out of 5 starsfootnote Below Average 2 out of 5 starsfootnote Below Average 2 out of 5 starsfootnote Below Average 3.0 [facility] [address] [place], [place] [address] [phone]   Add [facility] My Favorites- Opens in a new window 4 out of 5 starsfootnote Above Average 4 out of 5 starsfootnote Above Average 2 out of 5 starsfootnote Below Average 4 out of 5 starsfootnote Above Average 10.7 [facility] [address] [place], [place] [address] [phone]   Add [facility] My Favorites- Opens in a new window 3 out of 5 starsfootnote Average 3 out of 5 starsfootnote Average 2 out of 5 starsfootnote Below Average 3 out of 5 starsfootnote Average 12.5 [facility] [address] [place], [place] [address] [phone]   Add [facility] My Favorites- Opens in a new window 1 out of 5 starsfootnote Much Below Average 2 out of 5 starsfootnote Below Average 1 out of 5 starsfootnote Much Below Average 3 out of 5 starsfootnote Average 14.7 [facility] [address] [place], [place] [address] [phone]   Add [facility] My Favorites- Opens in a new window 4 out of 5 starsfootnote Above Average 3 out of 5 starsfootnote Average 2 out of 5  starsfootnote Below Average 5 out of 5 starsfootnote Much Above Average 22.5 [facility] [address] [place], [place] [address] [phone]   Add [facility] My Favorites- Opens in a new window 1 out of 5 starsfootnote Much Below Average 1 out of 5 starsfootnote Much Below Average 2 out of 5 starsfootnote Below Average 2 out of 5 starsfootnote Below Average 24.2 [facility] [address] [place], [place] [address] [phone]   Add [facility]to My Favorites- Lauris Poag in a new window 3 out of 5 starsfootnote Average 3 out of 5 starsfootnote Average 2 out of 5 starsfootnote Below Average 2 out of 5 starsfootnote Below Average 24.4 [facility] [address] [place], [place] [address] [phone]   Add [facility] My Favorites- Opens in a new window 1 out of 5 starsfootnote Much Below Average 1 out of 5 starsfootnote Much Below Average 2 out of 5 starsfootnote Below Average 3 out of 5 starsfootnote Average 25.5 [facility] [address] [place] [address] [place], [place] [address] [phone]   Add [facility] [place] [place] My Favorites- Opens in a new window 2 out of 5 starsfootnote Below Average 2 out of 5 starsfootnote Below Average 2 out of 5 starsfootnote Below Average 3 out of 5 starsfootnote Average 25.9 [facility]  [address] [place], [place] [address] [phone]   Add [facility] My Favorites-  Opens in a new window 5 out of 5 starsfootnote Much Above Average 5 out of 5 starsfootnote Much Above Average 3 out of 5 starsfootnote Average 2 out of 5 starsfootnote Below Average 26.1 [facility] [address] [place] [address] [phone]   Add [facility] My Favorites- Opens in a new window 3 out of 5 starsfootnote Average 3 out of 5 starsfootnote Average 2 out of 5 starsfootnote Below Average 4 out of 5 starsfootnote Above Average 26.7 [place] [address] [place] [address] [phone]   [place] [facility] My Favorites- Opens in a new window 3 out of 5 starsfootnote Average 3 out of 5 starsfootnote Average 3 out of 5 starsfootnote Average 4 out of 5 starsfootnote Above Average 27.0 [place], [place] [address] [phone] [place] [facility] My Favorites- Opens in a new window 2 out of 5 starsfootnote Below Average 2 out of 5 starsfootnote Below Average 2 out of 5 starsfootnote Below Average 2 out of 5 starsfootnote Below Average 27.0 [facility] [address] [place] [address] [phone]   [place] My Favorites- Opens in a new window 1 out of 5 starsfootnote Much Below Average 1 out of 5 starsfootnote Much Below Average 2 out of 5 starsfootnote Below Average 1 out of 5 starsfootnote Much Below Average 27.1 [place] [address] [phone]) [place] My Favorites- Opens in a new window 3 out of 5 starsfootnote Average 3 out of 5 starsfootnote Average 2 out of 5 starsfootnote Below Average 4 out of 5 starsfootnote Above Average 27.8 [facility] [place], [place] [address] [phone]   Add [facility] My [place] in a new window 5 out of 5 starsfootnote Much Above Average 5 out of 5 starsfootnote Much Above Average 5 out of 5 starsfootnote Much Above  Average 5 out of 5 starsfootnote Much Above Average 28.0 [place], [place] [address] [phone]   Add [place] My Favorites- Opens in a new window 5 out of 5 starsfootnote Much Above Average 4 out of 5 starsfootnote Above Average 5 out of 5 starsfootnote Much Above Average 5 out of 5 starsfootnote Much Above Average 28.1 [place], [place] [address] [phone]   [place] My Favorites- Opens in a new window 5 out of 5 starsfootnote Much Above Average 4 out of 5 starsfootnote Above Average 5 out of 5 starsfootnote Much Above Average 5 out of 5 starsfootnote Much Above Average 28.2 [place], [place] [address] [phone]   Add [facility] My [place] in a new window 5 out of 5 starsfootnote Much Above Average 4 out of 5 starsfootnote Above Average 5 out of 5 starsfootnote Much Above Average 5 out of 5 starsfootnote Much Above Average 28.3 [place]
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
## 2019-08-29 NOTE — Progress Notes (Signed)
  Echocardiogram 2D Echocardiogram has been performed.  Kyle [name] 08/29/2019, 10:40 AM

## 2019-08-30 DIAGNOSIS — Z6838 Body mass index (BMI) 38.0-38.9, adult: Secondary | ICD-10-CM | POA: Diagnosis not present

## 2019-08-30 DIAGNOSIS — Z20828 Contact with and (suspected) exposure to other viral communicable diseases: Secondary | ICD-10-CM | POA: Diagnosis present

## 2019-08-30 DIAGNOSIS — R42 Dizziness and giddiness: Secondary | ICD-10-CM | POA: Diagnosis present

## 2019-08-30 DIAGNOSIS — Z79890 Hormone replacement therapy (postmenopausal): Secondary | ICD-10-CM | POA: Diagnosis not present

## 2019-08-30 DIAGNOSIS — Z9181 History of falling: Secondary | ICD-10-CM | POA: Diagnosis not present

## 2019-08-30 DIAGNOSIS — F3130 Bipolar disorder, current episode depressed, mild or moderate severity, unspecified: Secondary | ICD-10-CM | POA: Diagnosis present

## 2019-08-30 DIAGNOSIS — N401 Enlarged prostate with lower urinary tract symptoms: Secondary | ICD-10-CM | POA: Diagnosis present

## 2019-08-30 DIAGNOSIS — R55 Syncope and collapse: Secondary | ICD-10-CM | POA: Diagnosis present

## 2019-08-30 DIAGNOSIS — I679 Cerebrovascular disease, unspecified: Secondary | ICD-10-CM | POA: Diagnosis present

## 2019-08-30 DIAGNOSIS — E785 Hyperlipidemia, unspecified: Secondary | ICD-10-CM | POA: Diagnosis present

## 2019-08-30 DIAGNOSIS — Z8673 Personal history of transient ischemic attack (TIA), and cerebral infarction without residual deficits: Secondary | ICD-10-CM | POA: Diagnosis not present

## 2019-08-30 DIAGNOSIS — Z79899 Other long term (current) drug therapy: Secondary | ICD-10-CM | POA: Diagnosis not present

## 2019-08-30 DIAGNOSIS — W19XXXA Unspecified fall, initial encounter: Secondary | ICD-10-CM | POA: Diagnosis not present

## 2019-08-30 DIAGNOSIS — R2681 Unsteadiness on feet: Secondary | ICD-10-CM | POA: Diagnosis present

## 2019-08-30 DIAGNOSIS — Z86711 Personal history of pulmonary embolism: Secondary | ICD-10-CM | POA: Diagnosis not present

## 2019-08-30 DIAGNOSIS — E669 Obesity, unspecified: Secondary | ICD-10-CM | POA: Diagnosis present

## 2019-08-30 DIAGNOSIS — G250 Essential tremor: Secondary | ICD-10-CM | POA: Diagnosis present

## 2019-08-30 DIAGNOSIS — E1140 Type 2 diabetes mellitus with diabetic neuropathy, unspecified: Secondary | ICD-10-CM | POA: Diagnosis present

## 2019-08-30 DIAGNOSIS — I10 Essential (primary) hypertension: Secondary | ICD-10-CM | POA: Diagnosis present

## 2019-08-30 LAB — GLUCOSE, CAPILLARY
Glucose-Capillary: 113 mg/dL — ABNORMAL HIGH (ref 70–99)
Glucose-Capillary: 158 mg/dL — ABNORMAL HIGH (ref 70–99)

## 2019-08-30 MED ORDER — ASPIRIN EC 81 MG PO TBEC: 81.0000 mg | DELAYED_RELEASE_TABLET | Freq: Every day | ORAL | 2 refills | Status: DC
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
## 2019-08-30 NOTE — TOC Transition Note (Signed)
Transition of Care [facility]) - CM/SW Discharge Note   Patient Details  Name: Kyle [name] MRN: [id number] Date of Birth: 1946/03/30  Transition of Care [facility]) CM/SW Contact:  Trish Mage, LCSW Phone Number: 08/30/2019, 11:16 AM   Clinical Narrative:   Pt is d/cing today.  Due to miraculous recovery, is going home instead of rehab.  He states he has a cane and RW at home, no BSC, and no shower chair, but states he primary bathes with wash rag and basin.  He chose services from [facility], and gave me his updated address and phone number.  He also confirmed that he knows what he needs to do to qualify for MCD, and confirmed he is in the process of getting that taken care of.  Cites supports of sister and "lady friend," and sister is coming to pick up.  TOC sign off.    Final next level of care: [place] Barriers to Discharge: Continued Medical Work up   Patient Goals and CMS Choice Patient states their goals for this hospitalization and ongoing recovery are:: to go to SNF. " I do not feel strong enough to go home, I will fall if I walk." CMS Medicare.gov Compare Post Acute Care list provided to:: Patient Choice offered to / list presented to : Patient  Discharge Placement                       Discharge Plan and Services                                     Social Determinants of Health (SDOH) Interventions     Readmission Risk Interventions No flowsheet data found.

## 2019-12-12 ENCOUNTER — Other Ambulatory Visit (HOSPITAL_COMMUNITY): Payer: Medicare Other

## 2019-12-12 ENCOUNTER — Inpatient Hospital Stay (HOSPITAL_COMMUNITY)
Admission: RE | Admit: 2019-12-12 | Discharge: 2020-01-25 | Disposition: A | Discharge status: Skilled Nursing Facility | Payer: Medicare Other | Source: Other Acute Inpatient Hospital | Attending: Internal Medicine | Admitting: Internal Medicine

## 2019-12-12 ENCOUNTER — Emergency Department
Admission: EM | Admit: 2019-12-12 | Discharge: 2019-12-12 | Disposition: A | Discharge status: Other Acute Inpatient Hospital | Payer: Medicare Other | Source: Other Acute Inpatient Hospital | Attending: Internal Medicine | Admitting: Internal Medicine

## 2019-12-12 DIAGNOSIS — J189 Pneumonia, unspecified organism: Secondary | ICD-10-CM

## 2019-12-12 DIAGNOSIS — N183 Chronic kidney disease, stage 3 unspecified: Secondary | ICD-10-CM | POA: Diagnosis present

## 2019-12-12 DIAGNOSIS — I482 Chronic atrial fibrillation, unspecified: Secondary | ICD-10-CM | POA: Diagnosis present

## 2019-12-12 DIAGNOSIS — J9621 Acute and chronic respiratory failure with hypoxia: Secondary | ICD-10-CM | POA: Diagnosis present

## 2019-12-12 DIAGNOSIS — R0603 Acute respiratory distress: Secondary | ICD-10-CM

## 2019-12-12 DIAGNOSIS — U071 COVID-19: Secondary | ICD-10-CM | POA: Diagnosis present

## 2019-12-12 DIAGNOSIS — J1282 Pneumonia due to coronavirus disease 2019: Secondary | ICD-10-CM | POA: Diagnosis present

## 2019-12-12 DIAGNOSIS — J9690 Respiratory failure, unspecified, unspecified whether with hypoxia or hypercapnia: Secondary | ICD-10-CM

## 2019-12-12 DIAGNOSIS — G9341 Metabolic encephalopathy: Secondary | ICD-10-CM | POA: Diagnosis present

## 2019-12-12 DIAGNOSIS — R509 Fever, unspecified: Secondary | ICD-10-CM

## 2019-12-12 DIAGNOSIS — Z931 Gastrostomy status: Secondary | ICD-10-CM

## 2019-12-12 HISTORY — DX: Chronic atrial fibrillation, unspecified: I48.20

## 2019-12-12 HISTORY — DX: Chronic kidney disease, stage 3 unspecified: N18.30

## 2019-12-12 HISTORY — DX: COVID-19: U07.1

## 2019-12-12 HISTORY — DX: Acute and chronic respiratory failure with hypoxia: J96.21

## 2019-12-12 HISTORY — DX: Pneumonia due to coronavirus disease 2019: J12.82

## 2019-12-12 HISTORY — DX: Metabolic encephalopathy: G93.41

## 2019-12-12 LAB — BLOOD GAS, ARTERIAL
Acid-base deficit: 2.9 mmol/L — ABNORMAL HIGH (ref 0.0–2.0)
Bicarbonate: 20.8 mmol/L (ref 20.0–28.0)
FIO2: 30
O2 Saturation: 94.7 %
Patient temperature: 37
pCO2 arterial: 32.2 mmHg (ref 32.0–48.0)
pH, Arterial: 7.426 (ref 7.350–7.450)
pO2, Arterial: 76.2 mmHg — ABNORMAL LOW (ref 83.0–108.0)

## 2019-12-12 MED ORDER — IOHEXOL 300 MG/ML  SOLN
40.0000 mL | Freq: Once | INTRAMUSCULAR | Status: AC | PRN
  Administered 2019-12-12: 40 mL

## 2019-12-13 ENCOUNTER — Institutional Professional Consult (permissible substitution) (HOSPITAL_COMMUNITY): Payer: Medicare Other

## 2019-12-13 DIAGNOSIS — J1282 Pneumonia due to coronavirus disease 2019: Secondary | ICD-10-CM

## 2019-12-13 DIAGNOSIS — N183 Chronic kidney disease, stage 3 unspecified: Secondary | ICD-10-CM

## 2019-12-13 DIAGNOSIS — J9621 Acute and chronic respiratory failure with hypoxia: Secondary | ICD-10-CM

## 2019-12-13 DIAGNOSIS — G9341 Metabolic encephalopathy: Secondary | ICD-10-CM | POA: Diagnosis not present

## 2019-12-13 DIAGNOSIS — U071 COVID-19: Secondary | ICD-10-CM

## 2019-12-13 DIAGNOSIS — I482 Chronic atrial fibrillation, unspecified: Secondary | ICD-10-CM

## 2019-12-13 LAB — COMPREHENSIVE METABOLIC PANEL
ALT: 143 U/L — ABNORMAL HIGH (ref 0–44)
AST: 147 U/L — ABNORMAL HIGH (ref 15–41)
Albumin: 1.7 g/dL — ABNORMAL LOW (ref 3.5–5.0)
Alkaline Phosphatase: 173 U/L — ABNORMAL HIGH (ref 38–126)
Anion gap: 7 (ref 5–15)
BUN: 62 mg/dL — ABNORMAL HIGH (ref 8–23)
CO2: 21 mmol/L — ABNORMAL LOW (ref 22–32)
Calcium: 9.1 mg/dL (ref 8.9–10.3)
Chloride: 121 mmol/L — ABNORMAL HIGH (ref 98–111)
Creatinine, Ser: 1.59 mg/dL — ABNORMAL HIGH (ref 0.61–1.24)
GFR calc Af Amer: 50 mL/min — ABNORMAL LOW (ref >60)
GFR calc non Af Amer: 43 mL/min — ABNORMAL LOW (ref >60)
Glucose, Bld: 101 mg/dL — ABNORMAL HIGH (ref 70–99)
Potassium: 3.3 mmol/L — ABNORMAL LOW (ref 3.5–5.1)
Sodium: 149 mmol/L — ABNORMAL HIGH (ref 135–145)
Total Bilirubin: 0.8 mg/dL (ref 0.3–1.2)
Total Protein: 7.2 g/dL (ref 6.5–8.1)

## 2019-12-13 LAB — CBC
HCT: 27.8 % — ABNORMAL LOW (ref 39.0–52.0)
Hemoglobin: 8.4 g/dL — ABNORMAL LOW (ref 13.0–17.0)
MCH: 31.7 pg (ref 26.0–34.0)
MCHC: 30.2 g/dL (ref 30.0–36.0)
MCV: 104.9 fL — ABNORMAL HIGH (ref 80.0–100.0)
Platelets: 322 10*3/uL (ref 150–400)
RBC: 2.65 MIL/uL — ABNORMAL LOW (ref 4.22–5.81)
RDW: 16.7 % — ABNORMAL HIGH (ref 11.5–15.5)
WBC: 8.3 10*3/uL (ref 4.0–10.5)
nRBC: 0 % (ref 0.0–0.2)

## 2019-12-13 LAB — VANCOMYCIN, TROUGH: Vancomycin Tr: 23 ug/mL (ref 15–20)

## 2019-12-14 DIAGNOSIS — I482 Chronic atrial fibrillation, unspecified: Secondary | ICD-10-CM | POA: Diagnosis not present

## 2019-12-14 DIAGNOSIS — G9341 Metabolic encephalopathy: Secondary | ICD-10-CM | POA: Diagnosis not present

## 2019-12-14 DIAGNOSIS — J9621 Acute and chronic respiratory failure with hypoxia: Secondary | ICD-10-CM | POA: Diagnosis not present

## 2019-12-14 DIAGNOSIS — N183 Chronic kidney disease, stage 3 unspecified: Secondary | ICD-10-CM | POA: Diagnosis not present

## 2019-12-14 LAB — VANCOMYCIN, TROUGH: Vancomycin Tr: 16 ug/mL (ref 15–20)

## 2019-12-14 LAB — POTASSIUM: Potassium: 3.5 mmol/L (ref 3.5–5.1)
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
## 2019-12-15 DIAGNOSIS — I482 Chronic atrial fibrillation, unspecified: Secondary | ICD-10-CM | POA: Diagnosis not present

## 2019-12-15 DIAGNOSIS — N183 Chronic kidney disease, stage 3 unspecified: Secondary | ICD-10-CM | POA: Diagnosis not present

## 2019-12-15 DIAGNOSIS — J9621 Acute and chronic respiratory failure with hypoxia: Secondary | ICD-10-CM | POA: Diagnosis not present

## 2019-12-15 DIAGNOSIS — G9341 Metabolic encephalopathy: Secondary | ICD-10-CM | POA: Diagnosis not present

## 2019-12-15 LAB — CBC
HCT: 27.5 % — ABNORMAL LOW (ref 39.0–52.0)
Hemoglobin: 8.3 g/dL — ABNORMAL LOW (ref 13.0–17.0)
MCH: 31.9 pg (ref 26.0–34.0)
MCHC: 30.2 g/dL (ref 30.0–36.0)
MCV: 105.8 fL — ABNORMAL HIGH (ref 80.0–100.0)
Platelets: 386 10*3/uL (ref 150–400)
RBC: 2.6 MIL/uL — ABNORMAL LOW (ref 4.22–5.81)
RDW: 17.2 % — ABNORMAL HIGH (ref 11.5–15.5)
WBC: 8.5 10*3/uL (ref 4.0–10.5)
nRBC: 0 % (ref 0.0–0.2)

## 2019-12-15 LAB — BASIC METABOLIC PANEL
Anion gap: 9 (ref 5–15)
BUN: 53 mg/dL — ABNORMAL HIGH (ref 8–23)
CO2: 20 mmol/L — ABNORMAL LOW (ref 22–32)
Calcium: 9.9 mg/dL (ref 8.9–10.3)
Chloride: 127 mmol/L — ABNORMAL HIGH (ref 98–111)
Creatinine, Ser: 1.45 mg/dL — ABNORMAL HIGH (ref 0.61–1.24)
GFR calc Af Amer: 55 mL/min — ABNORMAL LOW (ref >60)
GFR calc non Af Amer: 48 mL/min — ABNORMAL LOW (ref >60)
Glucose, Bld: 123 mg/dL — ABNORMAL HIGH (ref 70–99)
Potassium: 3.5 mmol/L (ref 3.5–5.1)
Sodium: 156 mmol/L — ABNORMAL HIGH (ref 135–145)

## 2019-12-15 NOTE — Progress Notes (Signed)
Pulmonary Critical Care Medicine [facility] [place]   PULMONARY CRITICAL CARE SERVICE  PROGRESS NOTE  Date of Service: 12/15/2019  Bodi Palmeri  BHA:[id number]  DOB: 18-Sep-1946   DOA: 12/12/2019  Referring Physician: Carron Curie, MD  HPI: Kyle [name] is a 74 y.o. male seen for follow up of Acute on Chronic Respiratory Failure.  Patient is somnolent full support right now is attempted on pressure support but did not tolerate  Medications: Reviewed on Rounds  Physical Exam:  Vitals: Temperature 100.8 pulse 85 respiratory rate 36 blood pressure is 143/72 saturations 99%  Ventilator Settings mode ventilation assist control FiO2 30%  . General: Comfortable at this time . Eyes: Grossly normal lids, irises & conjunctiva . ENT: grossly tongue is normal . Neck: no obvious mass . Cardiovascular: S1 S2 normal no gallop . Respiratory: Coarse breath sounds bilaterally . Abdomen: soft . Skin: no rash seen on limited exam . Musculoskeletal: not rigid . Psychiatric:unable to assess . Neurologic: no seizure no involuntary movements         Lab Data:   Basic Metabolic Panel: Recent Labs  Lab 12/13/19 0445 12/14/19 0450 12/15/19 0504  NA 149*  --  156*  K 3.3* 3.5 3.5  CL 121*  --  127*  CO2 21*  --  20*  GLUCOSE 101*  --  123*  BUN 62*  --  53*  CREATININE 1.59*  --  1.45*  CALCIUM 9.1  --  9.9    ABG: Recent Labs  Lab 12/12/19 2316  PHART 7.426  PCO2ART 32.2  PO2ART 76.2*  HCO3 20.8  O2SAT 94.7    Liver Function Tests: Recent Labs  Lab 12/13/19 0445  AST 147*  ALT 143*  ALKPHOS 173*  BILITOT 0.8  PROT 7.2  ALBUMIN 1.7*   No results for input(s): LIPASE, AMYLASE in the last 168 hours. No results for input(s): AMMONIA in the last 168 hours.  CBC: Recent Labs  Lab 12/13/19 0445 12/15/19 0504  WBC 8.3 8.5  HGB 8.4* 8.3*  HCT 27.8* 27.5*  MCV 104.9* 105.8*  PLT 322 386    Cardiac Enzymes: No results for input(s): CKTOTAL, CKMB,  CKMBINDEX, TROPONINI in the last 168 hours.  BNP (last 3 results) No results for input(s): BNP in the last 8760 hours.  ProBNP (last 3 results) No results for input(s): PROBNP in the last 8760 hours.  Radiological Exams: No results found.  Assessment/Plan Active Problems:   Acute on chronic respiratory failure with hypoxia (HCC)   COVID-19 virus infection   Pneumonia due to COVID-19 virus   Acute metabolic encephalopathy   Chronic kidney disease, stage III (moderate)   Chronic atrial fibrillation (HCC)   1. Acute on chronic respiratory failure with hypoxia was attempted on the wean today but did not tolerate it.  Will review again tomorrow and try to advance weaning further 2. COVID-19 virus infection treated improving slowly status post remdesivir and steroids 3. Pneumonia due to COVID-19 improving follow radiologically 4. Metabolic encephalopathy slow to improve 5. Chronic kidney disease improving creatinine will continue to follow 6. Chronic atrial fibrillation rate controlled on anticoagulation   I have personally seen and evaluated the patient, evaluated laboratory and imaging results, formulated the assessment and plan and placed orders. The Patient requires high complexity decision making with multiple systems involvement.  Rounds were done with the Respiratory Therapy Director and Staff therapists and discussed with nursing staff also.  Yevonne Pax, MD [facility] Pulmonary Critical Care Medicine Sleep Medicine

## 2019-12-16 ENCOUNTER — Encounter: Payer: Self-pay | Admitting: Internal Medicine

## 2019-12-16 DIAGNOSIS — N183 Chronic kidney disease, stage 3 unspecified: Secondary | ICD-10-CM | POA: Diagnosis present

## 2019-12-16 DIAGNOSIS — U071 COVID-19: Secondary | ICD-10-CM | POA: Diagnosis present

## 2019-12-16 DIAGNOSIS — G9341 Metabolic encephalopathy: Secondary | ICD-10-CM | POA: Diagnosis present

## 2019-12-16 DIAGNOSIS — I482 Chronic atrial fibrillation, unspecified: Secondary | ICD-10-CM | POA: Diagnosis present

## 2019-12-16 DIAGNOSIS — J9621 Acute and chronic respiratory failure with hypoxia: Secondary | ICD-10-CM | POA: Diagnosis not present

## 2019-12-16 LAB — BASIC METABOLIC PANEL
Anion gap: 7 (ref 5–15)
BUN: 52 mg/dL — ABNORMAL HIGH (ref 8–23)
CO2: 23 mmol/L (ref 22–32)
Calcium: 10.3 mg/dL (ref 8.9–10.3)
Chloride: 122 mmol/L — ABNORMAL HIGH (ref 98–111)
Creatinine, Ser: 1.45 mg/dL — ABNORMAL HIGH (ref 0.61–1.24)
GFR calc Af Amer: 55 mL/min — ABNORMAL LOW (ref >60)
GFR calc non Af Amer: 48 mL/min — ABNORMAL LOW (ref >60)
Glucose, Bld: 123 mg/dL — ABNORMAL HIGH (ref 70–99)
Potassium: 3.2 mmol/L — ABNORMAL LOW (ref 3.5–5.1)
Sodium: 152 mmol/L — ABNORMAL HIGH (ref 135–145)

## 2019-12-16 NOTE — Progress Notes (Signed)
Pulmonary Critical Care Medicine [facility] [place]   PULMONARY CRITICAL CARE SERVICE  PROGRESS NOTE  Date of Service: 12/16/2019  Kyle [name]  IHK:[id number]  DOB: 1946-06-30   DOA: 12/12/2019  Referring Physician: Carron Curie, MD  HPI: Kyle [name] is a 74 y.o. male seen for follow up of Acute on Chronic Respiratory Failure.  Patient is back on the ventilator on full support.  Will be started to wean today again  Medications: Reviewed on Rounds  Physical Exam:  Vitals: Temperature is 96.9 pulse 111 respiratory 20 blood pressure is 122/77 saturations 98%  Ventilator Settings mode ventilation assist control FiO2 28% tidal volume 485 PEEP 5  . General: Comfortable at this time . Eyes: Grossly normal lids, irises & conjunctiva . ENT: grossly tongue is normal . Neck: no obvious mass . Cardiovascular: S1 S2 normal no gallop . Respiratory: No rhonchi no rales are noted at this time . Abdomen: soft . Skin: no rash seen on limited exam . Musculoskeletal: not rigid . Psychiatric:unable to assess . Neurologic: no seizure no involuntary movements         Lab Data:   Basic Metabolic Panel: Recent Labs  Lab 12/13/19 0445 12/14/19 0450 12/15/19 0504 12/16/19 0541  NA 149*  --  156* 152*  K 3.3* 3.5 3.5 3.2*  CL 121*  --  127* 122*  CO2 21*  --  20* 23  GLUCOSE 101*  --  123* 123*  BUN 62*  --  53* 52*  CREATININE 1.59*  --  1.45* 1.45*  CALCIUM 9.1  --  9.9 10.3    ABG: Recent Labs  Lab 12/12/19 2316  PHART 7.426  PCO2ART 32.2  PO2ART 76.2*  HCO3 20.8  O2SAT 94.7    Liver Function Tests: Recent Labs  Lab 12/13/19 0445  AST 147*  ALT 143*  ALKPHOS 173*  BILITOT 0.8  PROT 7.2  ALBUMIN 1.7*   No results for input(s): LIPASE, AMYLASE in the last 168 hours. No results for input(s): AMMONIA in the last 168 hours.  CBC: Recent Labs  Lab 12/13/19 0445 12/15/19 0504  WBC 8.3 8.5  HGB 8.4* 8.3*  HCT 27.8* 27.5*  MCV 104.9* 105.8*  PLT  322 386    Cardiac Enzymes: No results for input(s): CKTOTAL, CKMB, CKMBINDEX, TROPONINI in the last 168 hours.  BNP (last 3 results) No results for input(s): BNP in the last 8760 hours.  ProBNP (last 3 results) No results for input(s): PROBNP in the last 8760 hours.  Radiological Exams: No results found.  Assessment/Plan Active Problems:   Acute on chronic respiratory failure with hypoxia (HCC)   COVID-19 virus infection   Pneumonia due to COVID-19 virus   Acute metabolic encephalopathy   Chronic kidney disease, stage III (moderate)   Chronic atrial fibrillation (HCC)   1. Acute on chronic respiratory failure hypoxia plan is to continue with on full support on the ventilator currently on 28% FiO2 2. COVID-19 virus infection treated clinically is improving 3. Pneumonia due to COVID-19 improved slowly oxygenation is better 4. Metabolic encephalopathy patient is at baseline 5. Chronic kidney disease following labs for stability 6. Chronic atrial fibrillation rate controlled   I have personally seen and evaluated the patient, evaluated laboratory and imaging results, formulated the assessment and plan and placed orders. The Patient requires high complexity decision making with multiple systems involvement.  Rounds were done with the Respiratory Therapy Director and Staff therapists and discussed with nursing staff also.  Yevonne Pax,  MD [facility] Pulmonary Critical Care Medicine Sleep Medicine

## 2019-12-17 DIAGNOSIS — I482 Chronic atrial fibrillation, unspecified: Secondary | ICD-10-CM | POA: Diagnosis not present

## 2019-12-17 DIAGNOSIS — G9341 Metabolic encephalopathy: Secondary | ICD-10-CM | POA: Diagnosis not present

## 2019-12-17 DIAGNOSIS — J9621 Acute and chronic respiratory failure with hypoxia: Secondary | ICD-10-CM | POA: Diagnosis not present

## 2019-12-17 DIAGNOSIS — N183 Chronic kidney disease, stage 3 unspecified: Secondary | ICD-10-CM | POA: Diagnosis not present

## 2019-12-17 LAB — BASIC METABOLIC PANEL
Anion gap: 8 (ref 5–15)
BUN: 41 mg/dL — ABNORMAL HIGH (ref 8–23)
CO2: 24 mmol/L (ref 22–32)
Calcium: 10.4 mg/dL — ABNORMAL HIGH (ref 8.9–10.3)
Chloride: 121 mmol/L — ABNORMAL HIGH (ref 98–111)
Creatinine, Ser: 1.37 mg/dL — ABNORMAL HIGH (ref 0.61–1.24)
GFR calc Af Amer: 59 mL/min — ABNORMAL LOW (ref >60)
GFR calc non Af Amer: 51 mL/min — ABNORMAL LOW (ref >60)
Glucose, Bld: 105 mg/dL — ABNORMAL HIGH (ref 70–99)
Potassium: 3.9 mmol/L (ref 3.5–5.1)
Sodium: 153 mmol/L — ABNORMAL HIGH (ref 135–145)

## 2019-12-17 NOTE — Progress Notes (Addendum)
Pulmonary Critical Care Medicine [facility] [place]   PULMONARY CRITICAL CARE SERVICE  PROGRESS NOTE  Date of Service: 12/17/2019  Kyle [name]  KWI:[id number]  DOB: [birth year]   DOA: 12/12/2019  Referring Physician: Carron Curie, MD  HPI: Kyle [name] is a 74 y.o. male seen for follow up of Acute on Chronic Respiratory Failure.  Patient only did 16 minutes on pressure support today is febrile 100.6 patient remains on full support on the ventilator at this time.  Medications: Reviewed on Rounds  Physical Exam:  Vitals: Pulse 90 respirations 30 BP 150/69 O2 sat 96% temp 100.6  Ventilator Settings ventilator mode AC VC rate of 15 tidal line 460 PEEP of 5 FiO2 28%  . General: Comfortable at this time . Eyes: Grossly normal lids, irises & conjunctiva . ENT: grossly tongue is normal . Neck: no obvious mass . Cardiovascular: S1 S2 normal no gallop . Respiratory: Coarse breath sounds . Abdomen: soft . Skin: no rash seen on limited exam . Musculoskeletal: not rigid . Psychiatric:unable to assess . Neurologic: no seizure no involuntary movements         Lab Data:   Basic Metabolic Panel: Recent Labs  Lab 12/13/19 0445 12/14/19 0450 12/15/19 0504 12/16/19 0541 12/17/19 0627  NA 149*  --  156* 152* 153*  K 3.3* 3.5 3.5 3.2* 3.9  CL 121*  --  127* 122* 121*  CO2 21*  --  20* 23 24  GLUCOSE 101*  --  123* 123* 105*  BUN 62*  --  53* 52* 41*  CREATININE 1.59*  --  1.45* 1.45* 1.37*  CALCIUM 9.1  --  9.9 10.3 10.4*    ABG: Recent Labs  Lab 12/12/19 2316  PHART 7.426  PCO2ART 32.2  PO2ART 76.2*  HCO3 20.8  O2SAT 94.7    Liver Function Tests: Recent Labs  Lab 12/13/19 0445  AST 147*  ALT 143*  ALKPHOS 173*  BILITOT 0.8  PROT 7.2  ALBUMIN 1.7*   No results for input(s): LIPASE, AMYLASE in the last 168 hours. No results for input(s): AMMONIA in the last 168 hours.  CBC: Recent Labs  Lab 12/13/19 0445 12/15/19 0504  WBC 8.3 8.5  HGB  8.4* 8.3*  HCT 27.8* 27.5*  MCV 104.9* 105.8*  PLT 322 386    Cardiac Enzymes: No results for input(s): CKTOTAL, CKMB, CKMBINDEX, TROPONINI in the last 168 hours.  BNP (last 3 results) No results for input(s): BNP in the last 8760 hours.  ProBNP (last 3 results) No results for input(s): PROBNP in the last 8760 hours.  Radiological Exams: No results found.  Assessment/Plan Active Problems:   Acute on chronic respiratory failure with hypoxia (HCC)   COVID-19 virus infection   Pneumonia due to COVID-19 virus   Acute metabolic encephalopathy   Chronic kidney disease, stage III (moderate)   Chronic atrial fibrillation (HCC)   1. Acute on chronic respiratory failure hypoxia patient continue on ventilator settings as above.  Continue to attempt weaning once patient is afebrile.  Continue supportive measures and pulmonary toilet. 2. COVID-19 virus infection treated clinically is improving 3. Pneumonia due to COVID-19 improved slowly oxygenation is better 4. Metabolic encephalopathy patient is at baseline 5. Chronic kidney disease following labs for stability 6. Chronic atrial fibrillation rate controlled    I have personally seen and evaluated the patient, evaluated laboratory and imaging results, formulated the assessment and plan and placed orders. The Patient requires high complexity decision making with multiple systems involvement.  Rounds were done with the Respiratory Therapy Director and Staff therapists and discussed with nursing staff also.  Allyne Gee, MD [facility] Pulmonary Critical Care Medicine Sleep Medicine

## 2019-12-18 DIAGNOSIS — I482 Chronic atrial fibrillation, unspecified: Secondary | ICD-10-CM | POA: Diagnosis not present

## 2019-12-18 DIAGNOSIS — J9621 Acute and chronic respiratory failure with hypoxia: Secondary | ICD-10-CM | POA: Diagnosis not present

## 2019-12-18 DIAGNOSIS — G9341 Metabolic encephalopathy: Secondary | ICD-10-CM | POA: Diagnosis not present

## 2019-12-18 DIAGNOSIS — N183 Chronic kidney disease, stage 3 unspecified: Secondary | ICD-10-CM | POA: Diagnosis not present

## 2019-12-18 NOTE — Progress Notes (Signed)
Pulmonary Critical Care Medicine [facility] [place]   PULMONARY CRITICAL CARE SERVICE  PROGRESS NOTE  Date of Service: 12/18/2019  Kyle [name]  ERX:[id number]  DOB: [birth year]   DOA: 12/12/2019  Referring Physician: Carron Curie, MD  HPI: Kyle [name] is a 74 y.o. male seen for follow up of Acute on Chronic Respiratory Failure.  Patient right now is on pressure support mode spine on 12/5 good tidal volumes are noted weaning  Medications: Reviewed on Rounds  Physical Exam:  Vitals: Temperature 100.4 pulse 70 respiratory rate 21 blood pressure is 153/68 saturations 98%  Ventilator Settings mode ventilation pressure support FiO2 20% tidal volume 428 pressure poor 12 PEEP 5  . General: Comfortable at this time . Eyes: Grossly normal lids, irises & conjunctiva . ENT: grossly tongue is normal . Neck: no obvious mass . Cardiovascular: S1 S2 normal no gallop . Respiratory: No rhonchi no rales are noted at this time . Abdomen: soft . Skin: no rash seen on limited exam . Musculoskeletal: not rigid . Psychiatric:unable to assess . Neurologic: no seizure no involuntary movements         Lab Data:   Basic Metabolic Panel: Recent Labs  Lab 12/13/19 0445 12/14/19 0450 12/15/19 0504 12/16/19 0541 12/17/19 0627  NA 149*  --  156* 152* 153*  K 3.3* 3.5 3.5 3.2* 3.9  CL 121*  --  127* 122* 121*  CO2 21*  --  20* 23 24  GLUCOSE 101*  --  123* 123* 105*  BUN 62*  --  53* 52* 41*  CREATININE 1.59*  --  1.45* 1.45* 1.37*  CALCIUM 9.1  --  9.9 10.3 10.4*    ABG: Recent Labs  Lab 12/12/19 2316  PHART 7.426  PCO2ART 32.2  PO2ART 76.2*  HCO3 20.8  O2SAT 94.7    Liver Function Tests: Recent Labs  Lab 12/13/19 0445  AST 147*  ALT 143*  ALKPHOS 173*  BILITOT 0.8  PROT 7.2  ALBUMIN 1.7*   No results for input(s): LIPASE, AMYLASE in the last 168 hours. No results for input(s): AMMONIA in the last 168 hours.  CBC: Recent Labs  Lab 12/13/19 0445  12/15/19 0504  WBC 8.3 8.5  HGB 8.4* 8.3*  HCT 27.8* 27.5*  MCV 104.9* 105.8*  PLT 322 386    Cardiac Enzymes: No results for input(s): CKTOTAL, CKMB, CKMBINDEX, TROPONINI in the last 168 hours.  BNP (last 3 results) No results for input(s): BNP in the last 8760 hours.  ProBNP (last 3 results) No results for input(s): PROBNP in the last 8760 hours.  Radiological Exams: No results found.  Assessment/Plan Active Problems:   Acute on chronic respiratory failure with hypoxia (HCC)   COVID-19 virus infection   Pneumonia due to COVID-19 virus   Acute metabolic encephalopathy   Chronic kidney disease, stage III (moderate)   Chronic atrial fibrillation (HCC)   1. Acute on chronic respiratory failure hypoxia plan is to continue to wean on pressure support titrate oxygen continue pulmonary toilet 2. COVID-19 virus infection treated clinically is improving 3. Pneumonia due to COVID-19 slowly improving 4. Acute metabolic encephalopathy patient is showing signs of improvement 5. Chronic kidney disease stable labs 6. Chronic atrial fibrillation rate controlled   I have personally seen and evaluated the patient, evaluated laboratory and imaging results, formulated the assessment and plan and placed orders. The Patient requires high complexity decision making with multiple systems involvement.  Rounds were done with the Respiratory Therapy Director and Staff therapists  and discussed with nursing staff also.  Allyne Gee, MD [facility] Pulmonary Critical Care Medicine Sleep Medicine

## 2019-12-19 DIAGNOSIS — G9341 Metabolic encephalopathy: Secondary | ICD-10-CM | POA: Diagnosis not present

## 2019-12-19 DIAGNOSIS — I482 Chronic atrial fibrillation, unspecified: Secondary | ICD-10-CM | POA: Diagnosis not present

## 2019-12-19 DIAGNOSIS — N183 Chronic kidney disease, stage 3 unspecified: Secondary | ICD-10-CM | POA: Diagnosis not present

## 2019-12-19 DIAGNOSIS — J9621 Acute and chronic respiratory failure with hypoxia: Secondary | ICD-10-CM | POA: Diagnosis not present

## 2019-12-19 LAB — BASIC METABOLIC PANEL
Anion gap: 9 (ref 5–15)
BUN: 27 mg/dL — ABNORMAL HIGH (ref 8–23)
CO2: 23 mmol/L (ref 22–32)
Calcium: 10.5 mg/dL — ABNORMAL HIGH (ref 8.9–10.3)
Chloride: 110 mmol/L (ref 98–111)
Creatinine, Ser: 1.03 mg/dL (ref 0.61–1.24)
GFR calc Af Amer: >60 mL/min (ref >60)
GFR calc non Af Amer: >60 mL/min (ref >60)
Glucose, Bld: 117 mg/dL — ABNORMAL HIGH (ref 70–99)
Potassium: 3.1 mmol/L — ABNORMAL LOW (ref 3.5–5.1)
Sodium: 142 mmol/L (ref 135–145)

## 2019-12-19 NOTE — Progress Notes (Signed)
Pulmonary Critical Care Medicine [facility] [place]   PULMONARY CRITICAL CARE SERVICE  PROGRESS NOTE  Date of Service: 12/19/2019  Kyle [name]  VQQ:[id number]  DOB: 1946-07-06   DOA: 12/12/2019  Referring Physician: Carron Curie, MD  HPI: Kyle [name] is a 74 y.o. male seen for follow up of Acute on Chronic Respiratory Failure.  Patient is on wean protocol on pressure support mode currently is on 4012 PEEP 5  Medications: Reviewed on Rounds  Physical Exam:  Vitals: Temperature 98.8 pulse 66 respiratory rate 22 blood pressure is 151/94 saturations 98%  Ventilator Settings mode ventilation pressure support FiO2 28% pressure poor 12 PEEP 5  . General: Comfortable at this time . Eyes: Grossly normal lids, irises & conjunctiva . ENT: grossly tongue is normal . Neck: no obvious mass . Cardiovascular: S1 S2 normal no gallop . Respiratory: No rhonchi coarse breath sounds . Abdomen: soft . Skin: no rash seen on limited exam . Musculoskeletal: not rigid . Psychiatric:unable to assess . Neurologic: no seizure no involuntary movements         Lab Data:   Basic Metabolic Panel: Recent Labs  Lab 12/13/19 0445 12/13/19 0445 12/14/19 0450 12/15/19 0504 12/16/19 0541 12/17/19 0627 12/19/19 0537  NA 149*  --   --  156* 152* 153* 142  K 3.3*   < > 3.5 3.5 3.2* 3.9 3.1*  CL 121*  --   --  127* 122* 121* 110  CO2 21*  --   --  20* 23 24 23   GLUCOSE 101*  --   --  123* 123* 105* 117*  BUN 62*  --   --  53* 52* 41* 27*  CREATININE 1.59*  --   --  1.45* 1.45* 1.37* 1.03  CALCIUM 9.1  --   --  9.9 10.3 10.4* 10.5*   < > = values in this interval not displayed.    ABG: Recent Labs  Lab 12/12/19 2316  PHART 7.426  PCO2ART 32.2  PO2ART 76.2*  HCO3 20.8  O2SAT 94.7    Liver Function Tests: Recent Labs  Lab 12/13/19 0445  AST 147*  ALT 143*  ALKPHOS 173*  BILITOT 0.8  PROT 7.2  ALBUMIN 1.7*   No results for input(s): LIPASE, AMYLASE in the last 168  hours. No results for input(s): AMMONIA in the last 168 hours.  CBC: Recent Labs  Lab 12/13/19 0445 12/15/19 0504  WBC 8.3 8.5  HGB 8.4* 8.3*  HCT 27.8* 27.5*  MCV 104.9* 105.8*  PLT 322 386    Cardiac Enzymes: No results for input(s): CKTOTAL, CKMB, CKMBINDEX, TROPONINI in the last 168 hours.  BNP (last 3 results) No results for input(s): BNP in the last 8760 hours.  ProBNP (last 3 results) No results for input(s): PROBNP in the last 8760 hours.  Radiological Exams: No results found.  Assessment/Plan Active Problems:   Acute on chronic respiratory failure with hypoxia (HCC)   COVID-19 virus infection   Pneumonia due to COVID-19 virus   Acute metabolic encephalopathy   Chronic kidney disease, stage III (moderate)   Chronic atrial fibrillation (HCC)   1. Acute on chronic respiratory failure hypoxia plan is to continue with full support on pressure support mode titrate oxygen continue pulmonary toilet 2. COVID-19 virus infection treated we will continue to monitor 3. Pneumonia due to COVID-19 treated 4. Acute metabolic encephalopathy no change 5. Chronic kidney disease stage III no changes we will continue to follow 6. Chronic atrial fibrillation rate controlled  I have personally seen and evaluated the patient, evaluated laboratory and imaging results, formulated the assessment and plan and placed orders. The Patient requires high complexity decision making with multiple systems involvement.  Rounds were done with the Respiratory Therapy Director and Staff therapists and discussed with nursing staff also.  Allyne Gee, MD [facility] Pulmonary Critical Care Medicine Sleep Medicine

## 2019-12-20 DIAGNOSIS — J9621 Acute and chronic respiratory failure with hypoxia: Secondary | ICD-10-CM | POA: Diagnosis not present

## 2019-12-20 DIAGNOSIS — I482 Chronic atrial fibrillation, unspecified: Secondary | ICD-10-CM | POA: Diagnosis not present

## 2019-12-20 DIAGNOSIS — N183 Chronic kidney disease, stage 3 unspecified: Secondary | ICD-10-CM | POA: Diagnosis not present

## 2019-12-20 DIAGNOSIS — G9341 Metabolic encephalopathy: Secondary | ICD-10-CM | POA: Diagnosis not present

## 2019-12-20 LAB — POTASSIUM: Potassium: 3.3 mmol/L — ABNORMAL LOW (ref 3.5–5.1)

## 2019-12-20 NOTE — Progress Notes (Signed)
Pulmonary Critical Care Medicine [facility] [place]   PULMONARY CRITICAL CARE SERVICE  PROGRESS NOTE  Date of Service: 12/20/2019  Kyle [name]  DSK:[id number]  DOB: 06/04/1946   DOA: 12/12/2019  Referring Physician: Carron Curie, MD  HPI: Kyle [name] is a 74 y.o. male seen for follow up of Acute on Chronic Respiratory Failure.  Patient is on pressure support right now has been on 28% FiO2 the goal today is for 16 hours  Medications: Reviewed on Rounds  Physical Exam:  Vitals: Temperature 99.4 pulse 63 respiratory rate 21 blood pressure is 144/64 saturations 98%  Ventilator Settings on pressure support FiO2 28% pressure 12 PEEP 5  . General: Comfortable at this time . Eyes: Grossly normal lids, irises & conjunctiva . ENT: grossly tongue is normal . Neck: no obvious mass . Cardiovascular: S1 S2 normal no gallop . Respiratory: Coarse breath sounds few rhonchi . Abdomen: soft . Skin: no rash seen on limited exam . Musculoskeletal: not rigid . Psychiatric:unable to assess . Neurologic: no seizure no involuntary movements         Lab Data:   Basic Metabolic Panel: Recent Labs  Lab 12/15/19 0504 12/16/19 0541 12/17/19 0627 12/19/19 0537 12/20/19 0810  NA 156* 152* 153* 142  --   K 3.5 3.2* 3.9 3.1* 3.3*  CL 127* 122* 121* 110  --   CO2 20* 23 24 23   --   GLUCOSE 123* 123* 105* 117*  --   BUN 53* 52* 41* 27*  --   CREATININE 1.45* 1.45* 1.37* 1.03  --   CALCIUM 9.9 10.3 10.4* 10.5*  --     ABG: No results for input(s): PHART, PCO2ART, PO2ART, HCO3, O2SAT in the last 168 hours.  Liver Function Tests: No results for input(s): AST, ALT, ALKPHOS, BILITOT, PROT, ALBUMIN in the last 168 hours. No results for input(s): LIPASE, AMYLASE in the last 168 hours. No results for input(s): AMMONIA in the last 168 hours.  CBC: Recent Labs  Lab 12/15/19 0504  WBC 8.5  HGB 8.3*  HCT 27.5*  MCV 105.8*  PLT 386    Cardiac Enzymes: No results for  input(s): CKTOTAL, CKMB, CKMBINDEX, TROPONINI in the last 168 hours.  BNP (last 3 results) No results for input(s): BNP in the last 8760 hours.  ProBNP (last 3 results) No results for input(s): PROBNP in the last 8760 hours.  Radiological Exams: No results found.  Assessment/Plan Active Problems:   Acute on chronic respiratory failure with hypoxia (HCC)   COVID-19 virus infection   Pneumonia due to COVID-19 virus   Acute metabolic encephalopathy   Chronic kidney disease, stage III (moderate)   Chronic atrial fibrillation (HCC)   1. Acute on chronic respiratory failure hypoxia plan is to give with pressure support titrate as tolerated with the patient's goal of 16 hours off the vent 2. COVID-19 virus infection resolved continue to monitor 3. Pneumonia due to COVID-19 slowly improving residual changes on chest x-ray 4. Acute metabolic encephalopathy we will continue to follow improving slowly 5. Chronic kidney disease resolving monitor labs 6. Chronic atrial fibrillation rate controlled   I have personally seen and evaluated the patient, evaluated laboratory and imaging results, formulated the assessment and plan and placed orders. The Patient requires high complexity decision making with multiple systems involvement.  Rounds were done with the Respiratory Therapy Director and Staff therapists and discussed with nursing staff also.  12/17/19, MD [facility] Pulmonary Critical Care Medicine Sleep Medicine

## 2019-12-21 DIAGNOSIS — D539 Nutritional anemia, unspecified: Secondary | ICD-10-CM

## 2019-12-21 DIAGNOSIS — J9621 Acute and chronic respiratory failure with hypoxia: Secondary | ICD-10-CM | POA: Diagnosis not present

## 2019-12-21 DIAGNOSIS — N183 Chronic kidney disease, stage 3 unspecified: Secondary | ICD-10-CM | POA: Diagnosis not present

## 2019-12-21 DIAGNOSIS — I482 Chronic atrial fibrillation, unspecified: Secondary | ICD-10-CM | POA: Diagnosis not present

## 2019-12-21 DIAGNOSIS — G9341 Metabolic encephalopathy: Secondary | ICD-10-CM | POA: Diagnosis not present

## 2019-12-21 LAB — CBC
HCT: 25.6 % — ABNORMAL LOW (ref 39.0–52.0)
Hemoglobin: 8 g/dL — ABNORMAL LOW (ref 13.0–17.0)
MCH: 32.5 pg (ref 26.0–34.0)
MCHC: 31.3 g/dL (ref 30.0–36.0)
MCV: 104.1 fL — ABNORMAL HIGH (ref 80.0–100.0)
Platelets: 225 10*3/uL (ref 150–400)
RBC: 2.46 MIL/uL — ABNORMAL LOW (ref 4.22–5.81)
RDW: 16.5 % — ABNORMAL HIGH (ref 11.5–15.5)
WBC: 6.6 10*3/uL (ref 4.0–10.5)
nRBC: 0 % (ref 0.0–0.2)

## 2019-12-21 LAB — BASIC METABOLIC PANEL
Anion gap: 5 (ref 5–15)
BUN: 32 mg/dL — ABNORMAL HIGH (ref 8–23)
CO2: 27 mmol/L (ref 22–32)
Calcium: 11.7 mg/dL — ABNORMAL HIGH (ref 8.9–10.3)
Chloride: 112 mmol/L — ABNORMAL HIGH (ref 98–111)
Creatinine, Ser: 0.92 mg/dL (ref 0.61–1.24)
GFR calc Af Amer: >60 mL/min (ref >60)
GFR calc non Af Amer: >60 mL/min (ref >60)
Glucose, Bld: 133 mg/dL — ABNORMAL HIGH (ref 70–99)
Potassium: 3.2 mmol/L — ABNORMAL LOW (ref 3.5–5.1)
Sodium: 144 mmol/L (ref 135–145)

## 2019-12-21 LAB — MAGNESIUM: Magnesium: 2.5 mg/dL — ABNORMAL HIGH (ref 1.7–2.4)

## 2019-12-21 NOTE — Progress Notes (Signed)
Pulmonary Critical Care Medicine [place]   PULMONARY CRITICAL CARE SERVICE  PROGRESS NOTE  Date of Service: 12/21/2019  Kyle [name]  LFY:[id number]  DOB: [birth year]   DOA: 12/12/2019  Referring Physician: Merton Border, MD  HPI: Kyle [name] is a 74 y.o. male seen for follow up of Acute on Chronic Respiratory Failure.  Patient currently is comfortable without distress has been on pressure support weaning the goal is to try to keep the patient back on T collar  Medications: Reviewed on Rounds  Physical Exam:  Vitals: Temperature 97.5 pulse 55 respiratory rate 20 blood pressure is 127/51 saturations 100%  Ventilator Settings mode ventilation pressure support FiO2 20% pressure 12 PEEP 5  . General: Comfortable at this time . Eyes: Grossly normal lids, irises & conjunctiva . ENT: grossly tongue is normal . Neck: no obvious mass . Cardiovascular: S1 S2 normal no gallop . Respiratory: No rhonchi coarse breath sounds . Abdomen: soft . Skin: no rash seen on limited exam . Musculoskeletal: not rigid . Psychiatric:unable to assess . Neurologic: no seizure no involuntary movements         Lab Data:   Basic Metabolic Panel: Recent Labs  Lab 12/15/19 0504 12/15/19 0504 12/16/19 0541 12/17/19 0627 12/19/19 0537 12/20/19 0810 12/21/19 1007  NA 156*  --  152* 153* 142  --  144  K 3.5   < > 3.2* 3.9 3.1* 3.3* 3.2*  CL 127*  --  122* 121* 110  --  112*  CO2 20*  --  23 24 23   --  27  GLUCOSE 123*  --  123* 105* 117*  --  133*  BUN 53*  --  52* 41* 27*  --  32*  CREATININE 1.45*  --  1.45* 1.37* 1.03  --  0.92  CALCIUM 9.9  --  10.3 10.4* 10.5*  --  11.7*  MG  --   --   --   --   --   --  2.5*   < > = values in this interval not displayed.    ABG: No results for input(s): PHART, PCO2ART, PO2ART, HCO3, O2SAT in the last 168 hours.  Liver Function Tests: No results for input(s): AST, ALT, ALKPHOS, BILITOT, PROT, ALBUMIN in the last 168 hours. No  results for input(s): LIPASE, AMYLASE in the last 168 hours. No results for input(s): AMMONIA in the last 168 hours.  CBC: Recent Labs  Lab 12/15/19 0504 12/21/19 1007  WBC 8.5 6.6  HGB 8.3* 8.0*  HCT 27.5* 25.6*  MCV 105.8* 104.1*  PLT 386 225    Cardiac Enzymes: No results for input(s): CKTOTAL, CKMB, CKMBINDEX, TROPONINI in the last 168 hours.  BNP (last 3 results) No results for input(s): BNP in the last 8760 hours.  ProBNP (last 3 results) No results for input(s): PROBNP in the last 8760 hours.  Radiological Exams: No results found.  Assessment/Plan Active Problems:   Acute on chronic respiratory failure with hypoxia (HCC)   COVID-19 virus infection   Pneumonia due to COVID-19 virus   Acute metabolic encephalopathy   Chronic kidney disease, stage III (moderate)   Chronic atrial fibrillation ([place])   1. Acute on chronic respiratory failure hypoxia plan is to continue with advancing the weaning with a goal of T collar today. 2. COVID-19 virus infection treated we will continue with supportive care 3. Pneumonia due to COVID-19 resolving 4. Acute metabolic encephalopathy patient is at his baseline still confused of note patient has elevated  MCV we will discuss with the primary care team 5. Chronic kidney disease stage III we will follow labs 6. Chronic atrial fibrillation rate is controlled   I have personally seen and evaluated the patient, evaluated laboratory and imaging results, formulated the assessment and plan and placed orders. The Patient requires high complexity decision making with multiple systems involvement.  Rounds were done with the Respiratory Therapy Director and Staff therapists and discussed with nursing staff also.  Time 35 minutes  Yevonne Pax, MD [facility] Pulmonary Critical Care Medicine Sleep Medicine

## 2019-12-22 ENCOUNTER — Other Ambulatory Visit (HOSPITAL_COMMUNITY): Payer: Medicare Other

## 2019-12-22 DIAGNOSIS — N183 Chronic kidney disease, stage 3 unspecified: Secondary | ICD-10-CM | POA: Diagnosis not present

## 2019-12-22 DIAGNOSIS — J9621 Acute and chronic respiratory failure with hypoxia: Secondary | ICD-10-CM | POA: Diagnosis not present

## 2019-12-22 DIAGNOSIS — I482 Chronic atrial fibrillation, unspecified: Secondary | ICD-10-CM | POA: Diagnosis not present

## 2019-12-22 DIAGNOSIS — G9341 Metabolic encephalopathy: Secondary | ICD-10-CM | POA: Diagnosis not present

## 2019-12-22 NOTE — Progress Notes (Signed)
Pulmonary Critical Care Medicine [facility] [place]   PULMONARY CRITICAL CARE SERVICE  PROGRESS NOTE  Date of Service: 12/22/2019  Kyle [name]  ALP:[id number]  DOB: Dec 04, 1945   DOA: 12/12/2019  Referring Physician: Carron Curie, MD  HPI: Kyle [name] is a 74 y.o. male seen for follow up of Acute on Chronic Respiratory Failure.  Patient currently is on full support on assist control mode has been on 28% FiO2 with good saturations  Medications: Reviewed on Rounds  Physical Exam:  Vitals: Temperature is 101.2 pulse 92 respiratory rate 21 blood pressure is 101/64 saturations 96%  Ventilator Settings on assist control FiO2 28% tidal volume is 505 PEEP of 5  . General: Comfortable at this time . Eyes: Grossly normal lids, irises & conjunctiva . ENT: grossly tongue is normal . Neck: no obvious mass . Cardiovascular: S1 S2 normal no gallop . Respiratory: No rhonchi no rales are noted at this time . Abdomen: soft . Skin: no rash seen on limited exam . Musculoskeletal: not rigid . Psychiatric:unable to assess . Neurologic: no seizure no involuntary movements         Lab Data:   Basic Metabolic Panel: Recent Labs  Lab 12/16/19 0541 12/17/19 0627 12/19/19 0537 12/20/19 0810 12/21/19 1007  NA 152* 153* 142  --  144  K 3.2* 3.9 3.1* 3.3* 3.2*  CL 122* 121* 110  --  112*  CO2 23 24 23   --  27  GLUCOSE 123* 105* 117*  --  133*  BUN 52* 41* 27*  --  32*  CREATININE 1.45* 1.37* 1.03  --  0.92  CALCIUM 10.3 10.4* 10.5*  --  11.7*  MG  --   --   --   --  2.5*    ABG: No results for input(s): PHART, PCO2ART, PO2ART, HCO3, O2SAT in the last 168 hours.  Liver Function Tests: No results for input(s): AST, ALT, ALKPHOS, BILITOT, PROT, ALBUMIN in the last 168 hours. No results for input(s): LIPASE, AMYLASE in the last 168 hours. No results for input(s): AMMONIA in the last 168 hours.  CBC: Recent Labs  Lab 12/21/19 1007  WBC 6.6  HGB 8.0*  HCT 25.6*  MCV  104.1*  PLT 225    Cardiac Enzymes: No results for input(s): CKTOTAL, CKMB, CKMBINDEX, TROPONINI in the last 168 hours.  BNP (last 3 results) No results for input(s): BNP in the last 8760 hours.  ProBNP (last 3 results) No results for input(s): PROBNP in the last 8760 hours.  Radiological Exams: No results found.  Assessment/Plan Active Problems:   Acute on chronic respiratory failure with hypoxia (HCC)   COVID-19 virus infection   Pneumonia due to COVID-19 virus   Acute metabolic encephalopathy   Chronic kidney disease, stage III (moderate)   Chronic atrial fibrillation (HCC)   1. Acute on chronic respiratory failure with hypoxia patient currently is having fevers may still be able to to tolerate the weaning.  Plan is going to be to continue with making attempts on the NAG device.  Plan is current.  Continue to monitor closely 2. COVID-19 virus infection.  We will continue with supportive care 3. Pneumonia due to COVID-19 treated clinically improving 4. Acute metabolic encephalopathy no change 5. Chronic kidney disease stage III at baseline 6. Chronic atrial fibrillation rate is controlled   I have personally seen and evaluated the patient, evaluated laboratory and imaging results, formulated the assessment and plan and placed orders. The Patient requires high complexity decision making  with multiple systems involvement.  Rounds were done with the Respiratory Therapy Director and Staff therapists and discussed with nursing staff also.  Allyne Gee, MD [facility] Pulmonary Critical Care Medicine Sleep Medicine

## 2019-12-23 DIAGNOSIS — J9621 Acute and chronic respiratory failure with hypoxia: Secondary | ICD-10-CM | POA: Diagnosis not present

## 2019-12-23 DIAGNOSIS — N183 Chronic kidney disease, stage 3 unspecified: Secondary | ICD-10-CM | POA: Diagnosis not present

## 2019-12-23 DIAGNOSIS — I482 Chronic atrial fibrillation, unspecified: Secondary | ICD-10-CM | POA: Diagnosis not present

## 2019-12-23 DIAGNOSIS — G9341 Metabolic encephalopathy: Secondary | ICD-10-CM | POA: Diagnosis not present

## 2019-12-23 LAB — POTASSIUM: Potassium: 3.4 mmol/L — ABNORMAL LOW (ref 3.5–5.1)
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
## 2019-12-24 DIAGNOSIS — I482 Chronic atrial fibrillation, unspecified: Secondary | ICD-10-CM | POA: Diagnosis not present

## 2019-12-24 DIAGNOSIS — N183 Chronic kidney disease, stage 3 unspecified: Secondary | ICD-10-CM | POA: Diagnosis not present

## 2019-12-24 DIAGNOSIS — J9621 Acute and chronic respiratory failure with hypoxia: Secondary | ICD-10-CM | POA: Diagnosis not present

## 2019-12-24 DIAGNOSIS — G9341 Metabolic encephalopathy: Secondary | ICD-10-CM | POA: Diagnosis not present

## 2019-12-24 LAB — POTASSIUM: Potassium: 3.1 mmol/L — ABNORMAL LOW (ref 3.5–5.1)

## 2019-12-24 NOTE — Progress Notes (Addendum)
Pulmonary Critical Care Medicine [facility] [place]   PULMONARY CRITICAL CARE SERVICE  PROGRESS NOTE  Date of Service: 12/24/2019  Kirklin Mcduffee  [id number]  DOB: 1946/08/25   DOA: 12/12/2019  Referring Physician: Carron Curie, MD  HPI: Kyle [name] is a 74 y.o. male seen for follow up of Acute on Chronic Respiratory Failure.  Patient remains on NAG 1 L for goal today of 16 hours currently satting well no fever distress.  Medications: Reviewed on Rounds  Physical Exam:  Vitals: Pulse 83 respirations 18 BP 171/66 O2 sat 97% temp 97.8  Ventilator Settings NAG 1 L  . General: Comfortable at this time . Eyes: Grossly normal lids, irises & conjunctiva . ENT: grossly tongue is normal . Neck: no obvious mass . Cardiovascular: S1 S2 normal no gallop . Respiratory: No rales or rhonchi noted . Abdomen: soft . Skin: no rash seen on limited exam . Musculoskeletal: not rigid . Psychiatric:unable to assess . Neurologic: no seizure no involuntary movements         Lab Data:   Basic Metabolic Panel: Recent Labs  Lab 12/19/19 0537 12/20/19 0810 12/21/19 1007 12/23/19 0927 12/24/19 1531  NA 142  --  144  --   --   K 3.1* 3.3* 3.2* 3.4* 3.1*  CL 110  --  112*  --   --   CO2 23  --  27  --   --   GLUCOSE 117*  --  133*  --   --   BUN 27*  --  32*  --   --   CREATININE 1.03  --  0.92  --   --   CALCIUM 10.5*  --  11.7*  --   --   MG  --   --  2.5*  --   --     ABG: No results for input(s): PHART, PCO2ART, PO2ART, HCO3, O2SAT in the last 168 hours.  Liver Function Tests: No results for input(s): AST, ALT, ALKPHOS, BILITOT, PROT, ALBUMIN in the last 168 hours. No results for input(s): LIPASE, AMYLASE in the last 168 hours. No results for input(s): AMMONIA in the last 168 hours.  CBC: Recent Labs  Lab 12/21/19 1007  WBC 6.6  HGB 8.0*  HCT 25.6*  MCV 104.1*  PLT 225    Cardiac Enzymes: No results for input(s): CKTOTAL, CKMB, CKMBINDEX, TROPONINI in  the last 168 hours.  BNP (last 3 results) No results for input(s): BNP in the last 8760 hours.  ProBNP (last 3 results) No results for input(s): PROBNP in the last 8760 hours.  Radiological Exams: No results found.  Assessment/Plan Active Problems:   Acute on chronic respiratory failure with hypoxia (HCC)   COVID-19 virus infection   Pneumonia due to COVID-19 virus   Acute metabolic encephalopathy   Chronic kidney disease, stage III (moderate)   Chronic atrial fibrillation (HCC)   1. Acute on chronic respiratory failure with hypoxia continue to wean on NAG device at this time.  Current goal is 16 hours.  Continue supportive measures and pulmonary toilet. 2. COVID-19 virus infection. We will continue with supportive care 3. Pneumonia due to COVID-19 treated clinically improving 4. Acute metabolic encephalopathy no change 5. Chronic kidney disease stage III at baseline 6. Chronic atrial fibrillation rate is controlled   I have personally seen and evaluated the patient, evaluated laboratory and imaging results, formulated the assessment and plan and placed orders. The Patient requires high complexity decision making with multiple systems involvement.  Rounds were done with the Respiratory Therapy Director and Staff therapists and discussed with nursing staff also.  Allyne Gee, MD [facility] Pulmonary Critical Care Medicine Sleep Medicine

## 2019-12-25 DIAGNOSIS — I482 Chronic atrial fibrillation, unspecified: Secondary | ICD-10-CM | POA: Diagnosis not present

## 2019-12-25 DIAGNOSIS — G9341 Metabolic encephalopathy: Secondary | ICD-10-CM | POA: Diagnosis not present

## 2019-12-25 DIAGNOSIS — J9621 Acute and chronic respiratory failure with hypoxia: Secondary | ICD-10-CM | POA: Diagnosis not present

## 2019-12-25 DIAGNOSIS — N183 Chronic kidney disease, stage 3 unspecified: Secondary | ICD-10-CM | POA: Diagnosis not present

## 2019-12-25 NOTE — Progress Notes (Signed)
Pulmonary Critical Care Medicine [facility] [place]   PULMONARY CRITICAL CARE SERVICE  PROGRESS NOTE  Date of Service: 12/25/2019  Kyle [name]  XTK:[id number]  DOB: 07/27/1946   DOA: 12/12/2019  Referring Physician: Carron Curie, MD  HPI: Kyle [name] is a 74 y.o. male seen for follow up of Acute on Chronic Respiratory Failure.  Patient currently is on the NAG on 2 L oxygen  Medications: Reviewed on Rounds  Physical Exam:  Vitals: Temperature 98.0 pulse 94 respiratory 21 blood pressure is 151/74 saturations 98%  Ventilator Settings off the ventilator on the NAG  . General: Comfortable at this time . Eyes: Grossly normal lids, irises & conjunctiva . ENT: grossly tongue is normal . Neck: no obvious mass . Cardiovascular: S1 S2 normal no gallop . Respiratory: No rhonchi no rales are noted . Abdomen: soft . Skin: no rash seen on limited exam . Musculoskeletal: not rigid . Psychiatric:unable to assess . Neurologic: no seizure no involuntary movements         Lab Data:   Basic Metabolic Panel: Recent Labs  Lab 12/19/19 0537 12/20/19 0810 12/21/19 1007 12/23/19 0927 12/24/19 1531  NA 142  --  144  --   --   K 3.1* 3.3* 3.2* 3.4* 3.1*  CL 110  --  112*  --   --   CO2 23  --  27  --   --   GLUCOSE 117*  --  133*  --   --   BUN 27*  --  32*  --   --   CREATININE 1.03  --  0.92  --   --   CALCIUM 10.5*  --  11.7*  --   --   MG  --   --  2.5*  --   --     ABG: No results for input(s): PHART, PCO2ART, PO2ART, HCO3, O2SAT in the last 168 hours.  Liver Function Tests: No results for input(s): AST, ALT, ALKPHOS, BILITOT, PROT, ALBUMIN in the last 168 hours. No results for input(s): LIPASE, AMYLASE in the last 168 hours. No results for input(s): AMMONIA in the last 168 hours.  CBC: Recent Labs  Lab 12/21/19 1007  WBC 6.6  HGB 8.0*  HCT 25.6*  MCV 104.1*  PLT 225    Cardiac Enzymes: No results for input(s): CKTOTAL, CKMB, CKMBINDEX, TROPONINI  in the last 168 hours.  BNP (last 3 results) No results for input(s): BNP in the last 8760 hours.  ProBNP (last 3 results) No results for input(s): PROBNP in the last 8760 hours.  Radiological Exams: No results found.  Assessment/Plan Active Problems:   Acute on chronic respiratory failure with hypoxia (HCC)   COVID-19 virus infection   Pneumonia due to COVID-19 virus   Acute metabolic encephalopathy   Chronic kidney disease, stage III (moderate)   Chronic atrial fibrillation (HCC)   1. Acute on chronic respiratory failure hypoxia continue to wean off the ventilator the goal today is for 20 hours 2. COVID-19 virus infection treated clinically improving 3. Pneumonia due to COVID-19 improving we will continue to follow 4. Metabolic encephalopathy no change 5. Chronic kidney disease stage III at baseline 6. Chronic atrial fibrillation rate controlled   I have personally seen and evaluated the patient, evaluated laboratory and imaging results, formulated the assessment and plan and placed orders. The Patient requires high complexity decision making with multiple systems involvement.  Rounds were done with the Respiratory Therapy Director and Staff therapists and discussed with nursing  staff also.  Allyne Gee, MD [facility] Pulmonary Critical Care Medicine Sleep Medicine

## 2019-12-26 DIAGNOSIS — I482 Chronic atrial fibrillation, unspecified: Secondary | ICD-10-CM | POA: Diagnosis not present

## 2019-12-26 DIAGNOSIS — G9341 Metabolic encephalopathy: Secondary | ICD-10-CM | POA: Diagnosis not present

## 2019-12-26 DIAGNOSIS — N183 Chronic kidney disease, stage 3 unspecified: Secondary | ICD-10-CM | POA: Diagnosis not present

## 2019-12-26 DIAGNOSIS — J9621 Acute and chronic respiratory failure with hypoxia: Secondary | ICD-10-CM | POA: Diagnosis not present

## 2019-12-26 LAB — BASIC METABOLIC PANEL
Anion gap: 9 (ref 5–15)
BUN: 50 mg/dL — ABNORMAL HIGH (ref 8–23)
CO2: 30 mmol/L (ref 22–32)
Calcium: 13.8 mg/dL (ref 8.9–10.3)
Chloride: 111 mmol/L (ref 98–111)
Creatinine, Ser: 1.12 mg/dL (ref 0.61–1.24)
GFR calc Af Amer: >60 mL/min (ref >60)
GFR calc non Af Amer: >60 mL/min (ref >60)
Glucose, Bld: 123 mg/dL — ABNORMAL HIGH (ref 70–99)
Potassium: 3.9 mmol/L (ref 3.5–5.1)
Sodium: 150 mmol/L — ABNORMAL HIGH (ref 135–145)

## 2019-12-26 NOTE — Progress Notes (Signed)
Pulmonary Critical Care Medicine [facility] [place]   PULMONARY CRITICAL CARE SERVICE  PROGRESS NOTE  Date of Service: 12/26/2019  Kyle [name]  TFT:[id number]  DOB: [birth year]   DOA: 12/12/2019  Referring Physician: Carron Curie, MD  HPI: Kyle [name] is a 74 y.o. male seen for follow up of Acute on Chronic Respiratory Failure.  Patient at this time is on the NAG with a goal of 24 hours  Medications: Reviewed on Rounds  Physical Exam:  Vitals: Temperature 98.4 pulse 92 respiratory rate 30 blood pressure is 145/76 saturations 100%  Ventilator Settings off the ventilator on the NAG  . General: Comfortable at this time . Eyes: Grossly normal lids, irises & conjunctiva . ENT: grossly tongue is normal . Neck: no obvious mass . Cardiovascular: S1 S2 normal no gallop . Respiratory: No rhonchi no rales are noted at this time . Abdomen: soft . Skin: no rash seen on limited exam . Musculoskeletal: not rigid . Psychiatric:unable to assess . Neurologic: no seizure no involuntary movements         Lab Data:   Basic Metabolic Panel: Recent Labs  Lab 12/20/19 0810 12/21/19 1007 12/23/19 0927 12/24/19 1531 12/26/19 0514  NA  --  144  --   --  150*  K 3.3* 3.2* 3.4* 3.1* 3.9  CL  --  112*  --   --  111  CO2  --  27  --   --  30  GLUCOSE  --  133*  --   --  123*  BUN  --  32*  --   --  50*  CREATININE  --  0.92  --   --  1.12  CALCIUM  --  11.7*  --   --  13.8*  MG  --  2.5*  --   --   --     ABG: No results for input(s): PHART, PCO2ART, PO2ART, HCO3, O2SAT in the last 168 hours.  Liver Function Tests: No results for input(s): AST, ALT, ALKPHOS, BILITOT, PROT, ALBUMIN in the last 168 hours. No results for input(s): LIPASE, AMYLASE in the last 168 hours. No results for input(s): AMMONIA in the last 168 hours.  CBC: Recent Labs  Lab 12/21/19 1007  WBC 6.6  HGB 8.0*  HCT 25.6*  MCV 104.1*  PLT 225    Cardiac Enzymes: No results for input(s):  CKTOTAL, CKMB, CKMBINDEX, TROPONINI in the last 168 hours.  BNP (last 3 results) No results for input(s): BNP in the last 8760 hours.  ProBNP (last 3 results) No results for input(s): PROBNP in the last 8760 hours.  Radiological Exams: No results found.  Assessment/Plan Active Problems:   Acute on chronic respiratory failure with hypoxia (HCC)   COVID-19 virus infection   Pneumonia due to COVID-19 virus   Acute metabolic encephalopathy   Chronic kidney disease, stage III (moderate)   Chronic atrial fibrillation (HCC)   1. Acute on chronic respiratory failure with hypoxia plan is to continue with the NAG continue secretion management pulmonary toilet. 2. COVID-19 virus infection at baseline resolving 3. Pneumonia due to COVID-19 improving 4. Acute metabolic encephalopathy slowly improving 5. Chronic kidney disease monitor labs 6. Chronic atrial fibrillation rate controlled   I have personally seen and evaluated the patient, evaluated laboratory and imaging results, formulated the assessment and plan and placed orders. The Patient requires high complexity decision making with multiple systems involvement.  Rounds were done with the Respiratory Therapy Director and Staff therapists and discussed  with nursing staff also.  Allyne Gee, MD [facility] Pulmonary Critical Care Medicine Sleep Medicine

## 2019-12-27 DIAGNOSIS — J9621 Acute and chronic respiratory failure with hypoxia: Secondary | ICD-10-CM | POA: Diagnosis not present

## 2019-12-27 DIAGNOSIS — I482 Chronic atrial fibrillation, unspecified: Secondary | ICD-10-CM | POA: Diagnosis not present

## 2019-12-27 DIAGNOSIS — N183 Chronic kidney disease, stage 3 unspecified: Secondary | ICD-10-CM | POA: Diagnosis not present

## 2019-12-27 DIAGNOSIS — G9341 Metabolic encephalopathy: Secondary | ICD-10-CM | POA: Diagnosis not present

## 2019-12-27 LAB — BASIC METABOLIC PANEL
Anion gap: 18 — ABNORMAL HIGH (ref 5–15)
BUN: 56 mg/dL — ABNORMAL HIGH (ref 8–23)
CO2: 29 mmol/L (ref 22–32)
Calcium: 13.8 mg/dL (ref 8.9–10.3)
Chloride: 106 mmol/L (ref 98–111)
Creatinine, Ser: 1.09 mg/dL (ref 0.61–1.24)
GFR calc Af Amer: >60 mL/min (ref >60)
GFR calc non Af Amer: >60 mL/min (ref >60)
Glucose, Bld: 146 mg/dL — ABNORMAL HIGH (ref 70–99)
Potassium: 4.1 mmol/L (ref 3.5–5.1)
Sodium: 153 mmol/L — ABNORMAL HIGH (ref 135–145)

## 2019-12-27 NOTE — Progress Notes (Signed)
Pulmonary Critical Care Medicine [facility] [place]   PULMONARY CRITICAL CARE SERVICE  PROGRESS NOTE  Date of Service: 12/27/2019  Kyle [name]  IHK:[id number]  DOB: 02/10/1946   DOA: 12/12/2019  Referring Physician: Carron Curie, MD  HPI: Kyle [name] is a 74 y.o. male seen for follow up of Acute on Chronic Respiratory Failure.  Patient currently is on the NAG on 2 L had increased respiratory rate noted however is tolerating it well the goal is for 24 hours  Medications: Reviewed on Rounds  Physical Exam:  Vitals: Temperature 97.4 pulse 73 respiratory rate 20 blood pressure is 145/65 saturations 98%  Ventilator Settings off the ventilator on the NAG on 2 L  . General: Comfortable at this time . Eyes: Grossly normal lids, irises & conjunctiva . ENT: grossly tongue is normal . Neck: no obvious mass . Cardiovascular: S1 S2 normal no gallop . Respiratory: No rhonchi no rales are noted at this time . Abdomen: soft . Skin: no rash seen on limited exam . Musculoskeletal: not rigid . Psychiatric:unable to assess . Neurologic: no seizure no involuntary movements         Lab Data:   Basic Metabolic Panel: Recent Labs  Lab 12/21/19 1007 12/23/19 0927 12/24/19 1531 12/26/19 0514 12/27/19 0651  NA 144  --   --  150* 153*  K 3.2* 3.4* 3.1* 3.9 4.1  CL 112*  --   --  111 106  CO2 27  --   --  30 29  GLUCOSE 133*  --   --  123* 146*  BUN 32*  --   --  50* 56*  CREATININE 0.92  --   --  1.12 1.09  CALCIUM 11.7*  --   --  13.8* 13.8*  MG 2.5*  --   --   --   --     ABG: No results for input(s): PHART, PCO2ART, PO2ART, HCO3, O2SAT in the last 168 hours.  Liver Function Tests: No results for input(s): AST, ALT, ALKPHOS, BILITOT, PROT, ALBUMIN in the last 168 hours. No results for input(s): LIPASE, AMYLASE in the last 168 hours. No results for input(s): AMMONIA in the last 168 hours.  CBC: Recent Labs  Lab 12/21/19 1007  WBC 6.6  HGB 8.0*  HCT 25.6*   MCV 104.1*  PLT 225    Cardiac Enzymes: No results for input(s): CKTOTAL, CKMB, CKMBINDEX, TROPONINI in the last 168 hours.  BNP (last 3 results) No results for input(s): BNP in the last 8760 hours.  ProBNP (last 3 results) No results for input(s): PROBNP in the last 8760 hours.  Radiological Exams: No results found.  Assessment/Plan Active Problems:   Acute on chronic respiratory failure with hypoxia (HCC)   COVID-19 virus infection   Pneumonia due to COVID-19 virus   Acute metabolic encephalopathy   Chronic kidney disease, stage III (moderate)   Chronic atrial fibrillation (HCC)   1. Acute on chronic respiratory failure hypoxia plan is to continue to wean for goal of 24 hours 2. COVID-19 virus infection no change we will continue present management 3. Pneumonia due to COVID-19 treated slowly improving 4. Metabolic encephalopathy no change 5. Chronic kidney disease stage III at baseline 6. Chronic atrial fibrillation rate controlled we will continue to follow   I have personally seen and evaluated the patient, evaluated laboratory and imaging results, formulated the assessment and plan and placed orders. The Patient requires high complexity decision making with multiple systems involvement.  Rounds were done  with the Respiratory Therapy Director and Staff therapists and discussed with nursing staff also.  Allyne Gee, MD [facility] Pulmonary Critical Care Medicine Sleep Medicine

## 2019-12-28 ENCOUNTER — Other Ambulatory Visit (HOSPITAL_COMMUNITY): Payer: Medicare Other

## 2019-12-28 DIAGNOSIS — I482 Chronic atrial fibrillation, unspecified: Secondary | ICD-10-CM | POA: Diagnosis not present

## 2019-12-28 DIAGNOSIS — J9621 Acute and chronic respiratory failure with hypoxia: Secondary | ICD-10-CM | POA: Diagnosis not present

## 2019-12-28 DIAGNOSIS — N183 Chronic kidney disease, stage 3 unspecified: Secondary | ICD-10-CM | POA: Diagnosis not present

## 2019-12-28 DIAGNOSIS — G9341 Metabolic encephalopathy: Secondary | ICD-10-CM | POA: Diagnosis not present

## 2019-12-28 LAB — BLOOD GAS, ARTERIAL
Acid-Base Excess: 6.4 mmol/L — ABNORMAL HIGH (ref 0.0–2.0)
Acid-Base Excess: 7.4 mmol/L — ABNORMAL HIGH (ref 0.0–2.0)
Bicarbonate: 31.1 mmol/L — ABNORMAL HIGH (ref 20.0–28.0)
Bicarbonate: 31.1 mmol/L — ABNORMAL HIGH (ref 20.0–28.0)
FIO2: 100
FIO2: 90
O2 Saturation: 90.6 %
O2 Saturation: 99.5 %
Patient temperature: 35.9
Patient temperature: 35.9
pCO2 arterial: 47.9 mmHg (ref 32.0–48.0)
pCO2 arterial: 39.9 mmHg (ref 32.0–48.0)
pH, Arterial: 7.421 (ref 7.350–7.450)
pH, Arterial: 7.498 — ABNORMAL HIGH (ref 7.350–7.450)
pO2, Arterial: 56.9 mmHg — ABNORMAL LOW (ref 83.0–108.0)
pO2, Arterial: 169 mmHg — ABNORMAL HIGH (ref 83.0–108.0)

## 2019-12-28 LAB — CBC
HCT: 32.1 % — ABNORMAL LOW (ref 39.0–52.0)
Hemoglobin: 10.1 g/dL — ABNORMAL LOW (ref 13.0–17.0)
MCH: 33.1 pg (ref 26.0–34.0)
MCHC: 31.5 g/dL (ref 30.0–36.0)
MCV: 105.2 fL — ABNORMAL HIGH (ref 80.0–100.0)
Platelets: 361 10*3/uL (ref 150–400)
RBC: 3.05 MIL/uL — ABNORMAL LOW (ref 4.22–5.81)
RDW: 18.4 % — ABNORMAL HIGH (ref 11.5–15.5)
WBC: 14.2 10*3/uL — ABNORMAL HIGH (ref 4.0–10.5)
nRBC: 0.1 % (ref 0.0–0.2)

## 2019-12-28 LAB — BASIC METABOLIC PANEL
Anion gap: 8 (ref 5–15)
BUN: 58 mg/dL — ABNORMAL HIGH (ref 8–23)
CO2: 32 mmol/L (ref 22–32)
Calcium: 13.2 mg/dL (ref 8.9–10.3)
Chloride: 104 mmol/L (ref 98–111)
Creatinine, Ser: 1.01 mg/dL (ref 0.61–1.24)
GFR calc Af Amer: >60 mL/min (ref >60)
GFR calc non Af Amer: >60 mL/min (ref >60)
Glucose, Bld: 152 mg/dL — ABNORMAL HIGH (ref 70–99)
Potassium: 3.9 mmol/L (ref 3.5–5.1)
Sodium: 144 mmol/L (ref 135–145)

## 2019-12-28 MED ORDER — IOHEXOL 350 MG/ML SOLN
100.0000 mL | Freq: Once | INTRAVENOUS | Status: AC | PRN
  Administered 2019-12-28: 13:00:00 100 mL via INTRAVENOUS
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
## 2019-12-29 DIAGNOSIS — N183 Chronic kidney disease, stage 3 unspecified: Secondary | ICD-10-CM | POA: Diagnosis not present

## 2019-12-29 DIAGNOSIS — J9621 Acute and chronic respiratory failure with hypoxia: Secondary | ICD-10-CM | POA: Diagnosis not present

## 2019-12-29 DIAGNOSIS — G9341 Metabolic encephalopathy: Secondary | ICD-10-CM | POA: Diagnosis not present

## 2019-12-29 DIAGNOSIS — I482 Chronic atrial fibrillation, unspecified: Secondary | ICD-10-CM | POA: Diagnosis not present
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
## 2019-12-30 DIAGNOSIS — N183 Chronic kidney disease, stage 3 unspecified: Secondary | ICD-10-CM | POA: Diagnosis not present

## 2019-12-30 DIAGNOSIS — J9621 Acute and chronic respiratory failure with hypoxia: Secondary | ICD-10-CM | POA: Diagnosis not present

## 2019-12-30 DIAGNOSIS — I482 Chronic atrial fibrillation, unspecified: Secondary | ICD-10-CM | POA: Diagnosis not present

## 2019-12-30 DIAGNOSIS — G9341 Metabolic encephalopathy: Secondary | ICD-10-CM | POA: Diagnosis not present

## 2019-12-30 LAB — VANCOMYCIN, TROUGH: Vancomycin Tr: 34 ug/mL (ref 15–20)

## 2019-12-30 NOTE — Progress Notes (Addendum)
Pulmonary Critical Care Medicine [facility] [place]   PULMONARY CRITICAL CARE SERVICE  PROGRESS NOTE  Date of Service: 12/30/2019  Kyle [name]  CZY:[id number]  DOB: January 28, 1946   DOA: 12/12/2019  Referring Physician: Carron Curie, MD  HPI: Kyle [name] is a 74 y.o. male seen for follow up of Acute on Chronic Respiratory Failure.  Patient remains on pressure support for 16-hour goal today currently on 12/5 with FiO2 of 40% satting well with no distress.  Medications: Reviewed on Rounds  Physical Exam:  Vitals: Pulse 57 respirations 20 BP 142/68 O2 sat 100% temp 98.1  Ventilator Settings pressure support 12/5 FiO2 40%  . General: Comfortable at this time . Eyes: Grossly normal lids, irises & conjunctiva . ENT: grossly tongue is normal . Neck: no obvious mass . Cardiovascular: S1 S2 normal no gallop . Respiratory: No rales or rhonchi noted . Abdomen: soft . Skin: no rash seen on limited exam . Musculoskeletal: not rigid . Psychiatric:unable to assess . Neurologic: no seizure no involuntary movements         Lab Data:   Basic Metabolic Panel: Recent Labs  Lab 12/24/19 1531 12/26/19 0514 12/27/19 0651 12/28/19 0528  NA  --  150* 153* 144  K 3.1* 3.9 4.1 3.9  CL  --  111 106 104  CO2  --  30 29 32  GLUCOSE  --  123* 146* 152*  BUN  --  50* 56* 58*  CREATININE  --  1.12 1.09 1.01  CALCIUM  --  13.8* 13.8* 13.2*    ABG: Recent Labs  Lab 12/28/19 1020 12/28/19 1046  PHART 7.421 7.498*  PCO2ART 47.9 39.9  PO2ART 56.9* 169*  HCO3 31.1* 31.1*  O2SAT 90.6 99.5    Liver Function Tests: No results for input(s): AST, ALT, ALKPHOS, BILITOT, PROT, ALBUMIN in the last 168 hours. No results for input(s): LIPASE, AMYLASE in the last 168 hours. No results for input(s): AMMONIA in the last 168 hours.  CBC: Recent Labs  Lab 12/28/19 1459  WBC 14.2*  HGB 10.1*  HCT 32.1*  MCV 105.2*  PLT 361    Cardiac Enzymes: No results for input(s): CKTOTAL,  CKMB, CKMBINDEX, TROPONINI in the last 168 hours.  BNP (last 3 results) No results for input(s): BNP in the last 8760 hours.  ProBNP (last 3 results) No results for input(s): PROBNP in the last 8760 hours.  Radiological Exams: No results found.  Assessment/Plan Active Problems:   Acute on chronic respiratory failure with hypoxia (HCC)   COVID-19 virus infection   Pneumonia due to COVID-19 virus   Acute metabolic encephalopathy   Chronic kidney disease, stage III (moderate)   Chronic atrial fibrillation (HCC)   1. Acute on chronic respiratory failure hypoxia plan is to continue with full support on the ventilator for 16-hour goal.  Continue to wean per protocol as patient can tolerate continue supportive measures. 2. COVID-19 virus infection in resolution phase 3. Pneumonia due to COVID-19 treated 4. Metabolic encephalopathy no change 5. Chronic kidney disease stage III 6. Chronic atrial fibrillation rate controlled   I have personally seen and evaluated the patient, evaluated laboratory and imaging results, formulated the assessment and plan and placed orders. The Patient requires high complexity decision making with multiple systems involvement.  Rounds were done with the Respiratory Therapy Director and Staff therapists and discussed with nursing staff also.  Yevonne Pax, MD [facility] Pulmonary Critical Care Medicine Sleep Medicine

## 2019-12-31 DIAGNOSIS — I482 Chronic atrial fibrillation, unspecified: Secondary | ICD-10-CM | POA: Diagnosis not present

## 2019-12-31 DIAGNOSIS — J9621 Acute and chronic respiratory failure with hypoxia: Secondary | ICD-10-CM | POA: Diagnosis not present

## 2019-12-31 DIAGNOSIS — G9341 Metabolic encephalopathy: Secondary | ICD-10-CM | POA: Diagnosis not present

## 2019-12-31 DIAGNOSIS — N183 Chronic kidney disease, stage 3 unspecified: Secondary | ICD-10-CM | POA: Diagnosis not present

## 2019-12-31 LAB — BLOOD GAS, ARTERIAL
Acid-Base Excess: 4.4 mmol/L — ABNORMAL HIGH (ref 0.0–2.0)
Bicarbonate: 28.5 mmol/L — ABNORMAL HIGH (ref 20.0–28.0)
FIO2: 100
O2 Saturation: 96.6 %
Patient temperature: 36.5
pCO2 arterial: 42.5 mmHg (ref 32.0–48.0)
pH, Arterial: 7.439 (ref 7.350–7.450)
pO2, Arterial: 77.9 mmHg — ABNORMAL LOW (ref 83.0–108.0)

## 2019-12-31 LAB — COMPREHENSIVE METABOLIC PANEL
ALT: 56 U/L — ABNORMAL HIGH (ref 0–44)
AST: 55 U/L — ABNORMAL HIGH (ref 15–41)
Albumin: 1.9 g/dL — ABNORMAL LOW (ref 3.5–5.0)
Alkaline Phosphatase: 158 U/L — ABNORMAL HIGH (ref 38–126)
Anion gap: 8 (ref 5–15)
BUN: 54 mg/dL — ABNORMAL HIGH (ref 8–23)
CO2: 28 mmol/L (ref 22–32)
Calcium: 12.6 mg/dL — ABNORMAL HIGH (ref 8.9–10.3)
Chloride: 103 mmol/L (ref 98–111)
Creatinine, Ser: 0.77 mg/dL (ref 0.61–1.24)
GFR calc Af Amer: >60 mL/min (ref >60)
GFR calc non Af Amer: >60 mL/min (ref >60)
Glucose, Bld: 149 mg/dL — ABNORMAL HIGH (ref 70–99)
Potassium: 2.9 mmol/L — ABNORMAL LOW (ref 3.5–5.1)
Sodium: 139 mmol/L (ref 135–145)
Total Bilirubin: 0.3 mg/dL (ref 0.3–1.2)
Total Protein: 6.9 g/dL (ref 6.5–8.1)

## 2019-12-31 LAB — CBC
HCT: 23.6 % — ABNORMAL LOW (ref 39.0–52.0)
Hemoglobin: 7.8 g/dL — ABNORMAL LOW (ref 13.0–17.0)
MCH: 33.2 pg (ref 26.0–34.0)
MCHC: 33.1 g/dL (ref 30.0–36.0)
MCV: 100.4 fL — ABNORMAL HIGH (ref 80.0–100.0)
Platelets: 275 10*3/uL (ref 150–400)
RBC: 2.35 MIL/uL — ABNORMAL LOW (ref 4.22–5.81)
RDW: 18.6 % — ABNORMAL HIGH (ref 11.5–15.5)
WBC: 9.2 10*3/uL (ref 4.0–10.5)
nRBC: 0.3 % — ABNORMAL HIGH (ref 0.0–0.2)

## 2019-12-31 LAB — VANCOMYCIN, TROUGH: Vancomycin Tr: 20 ug/mL (ref 15–20)

## 2019-12-31 LAB — POTASSIUM: Potassium: 5 mmol/L (ref 3.5–5.1)

## 2019-12-31 NOTE — Progress Notes (Addendum)
Pulmonary Critical Care Medicine [facility] [place]   PULMONARY CRITICAL CARE SERVICE  PROGRESS NOTE  Date of Service: 12/31/2019  Kyle [name]  [id number]  DOB: [birth year]   DOA: 12/12/2019  Referring Physician: Carron Curie, MD  HPI: Kyle [name] is a 74 y.o. male seen for follow up of Acute on Chronic Respiratory Failure.  Patient has a goal of 12 hours today on NAG failed and is now resting on full support satting well.  Medications: Reviewed on Rounds  Physical Exam:  Vitals: Pulse 61 respirations 21 BP 162/79 O2 sat 100% temp 96.7  Ventilator Settings ventilator mode AC PC rate of 15 tidal volume 460 PEEP of 7 with an FiO2 of 80%  . General: Comfortable at this time . Eyes: Grossly normal lids, irises & conjunctiva . ENT: grossly tongue is normal . Neck: no obvious mass . Cardiovascular: S1 S2 normal no gallop . Respiratory: No rales or rhonchi noted . Abdomen: soft . Skin: no rash seen on limited exam . Musculoskeletal: not rigid . Psychiatric:unable to assess . Neurologic: no seizure no involuntary movements         Lab Data:   Basic Metabolic Panel: Recent Labs  Lab 12/26/19 0514 12/27/19 0651 12/28/19 0528 12/31/19 0538  NA 150* 153* 144 139  K 3.9 4.1 3.9 2.9*  CL 111 106 104 103  CO2 30 29 32 28  GLUCOSE 123* 146* 152* 149*  BUN 50* 56* 58* 54*  CREATININE 1.12 1.09 1.01 0.77  CALCIUM 13.8* 13.8* 13.2* 12.6*    ABG: Recent Labs  Lab 12/28/19 1020 12/28/19 1046 12/31/19 1108  PHART 7.421 7.498* 7.439  PCO2ART 47.9 39.9 42.5  PO2ART 56.9* 169* 77.9*  HCO3 31.1* 31.1* 28.5*  O2SAT 90.6 99.5 96.6    Liver Function Tests: Recent Labs  Lab 12/31/19 0538  AST 55*  ALT 56*  ALKPHOS 158*  BILITOT 0.3  PROT 6.9  ALBUMIN 1.9*   No results for input(s): LIPASE, AMYLASE in the last 168 hours. No results for input(s): AMMONIA in the last 168 hours.  CBC: Recent Labs  Lab 12/28/19 1459 12/31/19 0538  WBC 14.2* 9.2   HGB 10.1* 7.8*  HCT 32.1* 23.6*  MCV 105.2* 100.4*  PLT 361 275    Cardiac Enzymes: No results for input(s): CKTOTAL, CKMB, CKMBINDEX, TROPONINI in the last 168 hours.  BNP (last 3 results) No results for input(s): BNP in the last 8760 hours.  ProBNP (last 3 results) No results for input(s): PROBNP in the last 8760 hours.  Radiological Exams: No results found.  Assessment/Plan Active Problems:   Acute on chronic respiratory failure with hypoxia (HCC)   COVID-19 virus infection   Pneumonia due to COVID-19 virus   Acute metabolic encephalopathy   Chronic kidney disease, stage III (moderate)   Chronic atrial fibrillation (HCC)   1. Acute on chronic respiratory failure hypoxia we will continue to attempt weaning as patient can tolerate.  Currently requiring high level FiO2 80%.  We will continue supportive measures and pulmonary toilet. 2. COVID-19 virus infection in resolution phase 3. Pneumonia due to COVID-19 treated 4. Metabolic encephalopathy no change 5. Chronic kidney disease stage III 6. Chronic atrial fibrillation rate controlled   I have personally seen and evaluated the patient, evaluated laboratory and imaging results, formulated the assessment and plan and placed orders. The Patient requires high complexity decision making with multiple systems involvement.  Rounds were done with the Respiratory Therapy Director and Staff therapists and discussed with nursing  staff also.  Allyne Gee, MD [facility] Pulmonary Critical Care Medicine Sleep Medicine

## 2020-01-01 DIAGNOSIS — I482 Chronic atrial fibrillation, unspecified: Secondary | ICD-10-CM | POA: Diagnosis not present

## 2020-01-01 DIAGNOSIS — J9621 Acute and chronic respiratory failure with hypoxia: Secondary | ICD-10-CM | POA: Diagnosis not present

## 2020-01-01 DIAGNOSIS — N183 Chronic kidney disease, stage 3 unspecified: Secondary | ICD-10-CM | POA: Diagnosis not present

## 2020-01-01 DIAGNOSIS — G9341 Metabolic encephalopathy: Secondary | ICD-10-CM | POA: Diagnosis not present

## 2020-01-01 LAB — POTASSIUM: Potassium: 3.9 mmol/L (ref 3.5–5.1)

## 2020-01-01 NOTE — Progress Notes (Signed)
Pulmonary Critical Care Medicine [place]   PULMONARY CRITICAL CARE SERVICE  PROGRESS NOTE  Date of Service: 01/01/2020  Kyle [name]  DDU:[id number]  DOB: 07/06/1946   DOA: 12/12/2019  Referring Physician: Merton Border, MD  HPI: Kyle [name] is a 74 y.o. male seen for follow up of Acute on Chronic Respiratory Failure.  Patient currently is on the NAG is comfortable right now without distress has been on 5 L oxygen  Medications: Reviewed on Rounds  Physical Exam:  Vitals: Temperature 97.6 pulse 87 respiratory rate 29 blood pressure is 128/64 saturations 100%  Ventilator Settings off the ventilator on the NAG  . General: Comfortable at this time . Eyes: Grossly normal lids, irises & conjunctiva . ENT: grossly tongue is normal . Neck: no obvious mass . Cardiovascular: S1 S2 normal no gallop . Respiratory: Scattered rhonchi expansion is equal . Abdomen: soft . Skin: no rash seen on limited exam . Musculoskeletal: not rigid . Psychiatric:unable to assess . Neurologic: no seizure no involuntary movements         Lab Data:   Basic Metabolic Panel: Recent Labs  Lab 12/26/19 0514 12/26/19 0514 12/27/19 0651 12/28/19 0528 12/31/19 0538 12/31/19 2156 01/01/20 0446  NA 150*  --  153* 144 139  --   --   K 3.9   < > 4.1 3.9 2.9* 5.0 3.9  CL 111  --  106 104 103  --   --   CO2 30  --  29 32 28  --   --   GLUCOSE 123*  --  146* 152* 149*  --   --   BUN 50*  --  56* 58* 54*  --   --   CREATININE 1.12  --  1.09 1.01 0.77  --   --   CALCIUM 13.8*  --  13.8* 13.2* 12.6*  --   --    < > = values in this interval not displayed.    ABG: Recent Labs  Lab 12/28/19 1020 12/28/19 1046 12/31/19 1108  PHART 7.421 7.498* 7.439  PCO2ART 47.9 39.9 42.5  PO2ART 56.9* 169* 77.9*  HCO3 31.1* 31.1* 28.5*  O2SAT 90.6 99.5 96.6    Liver Function Tests: Recent Labs  Lab 12/31/19 0538  AST 55*  ALT 56*  ALKPHOS 158*  BILITOT 0.3  PROT 6.9  ALBUMIN 1.9*    No results for input(s): LIPASE, AMYLASE in the last 168 hours. No results for input(s): AMMONIA in the last 168 hours.  CBC: Recent Labs  Lab 12/28/19 1459 12/31/19 0538  WBC 14.2* 9.2  HGB 10.1* 7.8*  HCT 32.1* 23.6*  MCV 105.2* 100.4*  PLT 361 275    Cardiac Enzymes: No results for input(s): CKTOTAL, CKMB, CKMBINDEX, TROPONINI in the last 168 hours.  BNP (last 3 results) No results for input(s): BNP in the last 8760 hours.  ProBNP (last 3 results) No results for input(s): PROBNP in the last 8760 hours.  Radiological Exams: No results found.  Assessment/Plan Active Problems:   Acute on chronic respiratory failure with hypoxia (HCC)   COVID-19 virus infection   Pneumonia due to COVID-19 virus   Acute metabolic encephalopathy   Chronic kidney disease, stage III (moderate)   Chronic atrial fibrillation ([place])   1. Acute on chronic respiratory failure hypoxia plan is to continue to advance the weaning as tolerated titrate oxygen down as tolerated 2. COVID-19 virus infection treated resolved 3. Pneumonia due to COVID-19 treated we will continue present management  4. Acute metabolic encephalopathy no change 5. Chronic kidney disease stage III we will continue to follow-up labs 6. Chronic atrial fibrillation rate controlled   I have personally seen and evaluated the patient, evaluated laboratory and imaging results, formulated the assessment and plan and placed orders. The Patient requires high complexity decision making with multiple systems involvement.  Rounds were done with the Respiratory Therapy Director and Staff therapists and discussed with nursing staff also.  Yevonne Pax, MD [facility] Pulmonary Critical Care Medicine Sleep Medicine

## 2020-01-02 DIAGNOSIS — I482 Chronic atrial fibrillation, unspecified: Secondary | ICD-10-CM | POA: Diagnosis not present

## 2020-01-02 DIAGNOSIS — N183 Chronic kidney disease, stage 3 unspecified: Secondary | ICD-10-CM | POA: Diagnosis not present

## 2020-01-02 DIAGNOSIS — J9621 Acute and chronic respiratory failure with hypoxia: Secondary | ICD-10-CM | POA: Diagnosis not present

## 2020-01-02 DIAGNOSIS — G9341 Metabolic encephalopathy: Secondary | ICD-10-CM | POA: Diagnosis not present

## 2020-01-02 LAB — VANCOMYCIN, TROUGH: Vancomycin Tr: 25 ug/mL (ref 15–20)

## 2020-01-02 NOTE — Progress Notes (Addendum)
Pulmonary Critical Care Medicine [facility] [place]   PULMONARY CRITICAL CARE SERVICE  PROGRESS NOTE  Date of Service: 01/02/2020  Kyle [name]  FAO:[id number]  DOB: 1945/12/03   DOA: 12/12/2019  Referring Physician: Carron Curie, MD  HPI: Kyle [name] is a 74 y.o. male seen for follow up of Acute on Chronic Respiratory Failure.  Patient at this time is on the NAG on 3 L oxygen the goal is for 24 hours  Medications: Reviewed on Rounds  Physical Exam:  Vitals: Temperature 98.2 pulse 66 respiratory 28 blood pressure is 140/60 saturations 100%  Ventilator Settings on NAG 3 L  . General: Comfortable at this time . Eyes: Grossly normal lids, irises & conjunctiva . ENT: grossly tongue is normal . Neck: no obvious mass . Cardiovascular: S1 S2 normal no gallop . Respiratory: Scattered rhonchi expansion is equal . Abdomen: soft . Skin: no rash seen on limited exam . Musculoskeletal: not rigid . Psychiatric:unable to assess . Neurologic: no seizure no involuntary movements         Lab Data:   Basic Metabolic Panel: Recent Labs  Lab 12/27/19 0651 12/28/19 0528 12/31/19 0538 12/31/19 2156 01/01/20 0446  NA 153* 144 139  --   --   K 4.1 3.9 2.9* 5.0 3.9  CL 106 104 103  --   --   CO2 29 32 28  --   --   GLUCOSE 146* 152* 149*  --   --   BUN 56* 58* 54*  --   --   CREATININE 1.09 1.01 0.77  --   --   CALCIUM 13.8* 13.2* 12.6*  --   --     ABG: Recent Labs  Lab 12/28/19 1020 12/28/19 1046 12/31/19 1108  PHART 7.421 7.498* 7.439  PCO2ART 47.9 39.9 42.5  PO2ART 56.9* 169* 77.9*  HCO3 31.1* 31.1* 28.5*  O2SAT 90.6 99.5 96.6    Liver Function Tests: Recent Labs  Lab 12/31/19 0538  AST 55*  ALT 56*  ALKPHOS 158*  BILITOT 0.3  PROT 6.9  ALBUMIN 1.9*   No results for input(s): LIPASE, AMYLASE in the last 168 hours. No results for input(s): AMMONIA in the last 168 hours.  CBC: Recent Labs  Lab 12/28/19 1459 12/31/19 0538  WBC 14.2* 9.2   HGB 10.1* 7.8*  HCT 32.1* 23.6*  MCV 105.2* 100.4*  PLT 361 275    Cardiac Enzymes: No results for input(s): CKTOTAL, CKMB, CKMBINDEX, TROPONINI in the last 168 hours.  BNP (last 3 results) No results for input(s): BNP in the last 8760 hours.  ProBNP (last 3 results) No results for input(s): PROBNP in the last 8760 hours.  Radiological Exams: No results found.  Assessment/Plan Active Problems:   Acute on chronic respiratory failure with hypoxia (HCC)   COVID-19 virus infection   Pneumonia due to COVID-19 virus   Acute metabolic encephalopathy   Chronic kidney disease, stage III (moderate)   Chronic atrial fibrillation (HCC)   1. Acute on chronic respiratory failure with hypoxia patient is on 3 L weaning 24-hour goal today on the NAG 2. COVID-19 virus infection resolving 3. Pneumonia due to COVID-19 improving clinically we will follow 4. Acute metabolic encephalopathy also significantly improved 5. Chronic kidney disease stage III following labs 6. Chronic atrial fibrillation rate controlled   I have personally seen and evaluated the patient, evaluated laboratory and imaging results, formulated the assessment and plan and placed orders. The Patient requires high complexity decision making with multiple systems involvement.  Rounds were done with the Respiratory Therapy Director and Staff therapists and discussed with nursing staff also.  Allyne Gee, MD [facility] Pulmonary Critical Care Medicine Sleep Medicine

## 2020-01-03 DIAGNOSIS — I482 Chronic atrial fibrillation, unspecified: Secondary | ICD-10-CM | POA: Diagnosis not present

## 2020-01-03 DIAGNOSIS — N183 Chronic kidney disease, stage 3 unspecified: Secondary | ICD-10-CM | POA: Diagnosis not present

## 2020-01-03 DIAGNOSIS — J9621 Acute and chronic respiratory failure with hypoxia: Secondary | ICD-10-CM | POA: Diagnosis not present

## 2020-01-03 DIAGNOSIS — G9341 Metabolic encephalopathy: Secondary | ICD-10-CM | POA: Diagnosis not present

## 2020-01-03 LAB — BASIC METABOLIC PANEL
Anion gap: 7 (ref 5–15)
BUN: 42 mg/dL — ABNORMAL HIGH (ref 8–23)
CO2: 27 mmol/L (ref 22–32)
Calcium: 11.9 mg/dL — ABNORMAL HIGH (ref 8.9–10.3)
Chloride: 102 mmol/L (ref 98–111)
Creatinine, Ser: 0.51 mg/dL — ABNORMAL LOW (ref 0.61–1.24)
GFR calc Af Amer: >60 mL/min (ref >60)
GFR calc non Af Amer: >60 mL/min (ref >60)
Glucose, Bld: 137 mg/dL — ABNORMAL HIGH (ref 70–99)
Potassium: 3.1 mmol/L — ABNORMAL LOW (ref 3.5–5.1)
Sodium: 136 mmol/L (ref 135–145)

## 2020-01-03 NOTE — Progress Notes (Addendum)
Pulmonary Critical Care Medicine [facility] [place]   PULMONARY CRITICAL CARE SERVICE  PROGRESS NOTE  Date of Service: 01/03/2020  Kyle [name]  HDQ:[id number]  DOB: 1946-05-25   DOA: 12/12/2019  Referring Physician: Carron Curie, MD  HPI: Kyle [name] is a 74 y.o. male seen for follow up of Acute on Chronic Respiratory Failure.  Patient mains on 9 at 2 L nasal cannula has a goal of 48 hours at this point.  Medications: Reviewed on Rounds  Physical Exam:  Vitals: Pulse 71 respirations 24 BP 136/87 O2 sat 100% temp 98.7  Ventilator Settings NAG 2 L  . General: Comfortable at this time . Eyes: Grossly normal lids, irises & conjunctiva . ENT: grossly tongue is normal . Neck: no obvious mass . Cardiovascular: S1 S2 normal no gallop . Respiratory: No rales or rhonchi noted . Abdomen: soft . Skin: no rash seen on limited exam . Musculoskeletal: not rigid . Psychiatric:unable to assess . Neurologic: no seizure no involuntary movements         Lab Data:   Basic Metabolic Panel: Recent Labs  Lab 12/28/19 0528 12/31/19 0538 12/31/19 2156 01/01/20 0446 01/03/20 0925  NA 144 139  --   --  136  K 3.9 2.9* 5.0 3.9 3.1*  CL 104 103  --   --  102  CO2 32 28  --   --  27  GLUCOSE 152* 149*  --   --  137*  BUN 58* 54*  --   --  42*  CREATININE 1.01 0.77  --   --  0.51*  CALCIUM 13.2* 12.6*  --   --  11.9*    ABG: Recent Labs  Lab 12/28/19 1020 12/28/19 1046 12/31/19 1108  PHART 7.421 7.498* 7.439  PCO2ART 47.9 39.9 42.5  PO2ART 56.9* 169* 77.9*  HCO3 31.1* 31.1* 28.5*  O2SAT 90.6 99.5 96.6    Liver Function Tests: Recent Labs  Lab 12/31/19 0538  AST 55*  ALT 56*  ALKPHOS 158*  BILITOT 0.3  PROT 6.9  ALBUMIN 1.9*   No results for input(s): LIPASE, AMYLASE in the last 168 hours. No results for input(s): AMMONIA in the last 168 hours.  CBC: Recent Labs  Lab 12/28/19 1459 12/31/19 0538  WBC 14.2* 9.2  HGB 10.1* 7.8*  HCT 32.1* 23.6*   MCV 105.2* 100.4*  PLT 361 275    Cardiac Enzymes: No results for input(s): CKTOTAL, CKMB, CKMBINDEX, TROPONINI in the last 168 hours.  BNP (last 3 results) No results for input(s): BNP in the last 8760 hours.  ProBNP (last 3 results) No results for input(s): PROBNP in the last 8760 hours.  Radiological Exams: No results found.  Assessment/Plan Active Problems:   Acute on chronic respiratory failure with hypoxia (HCC)   COVID-19 virus infection   Pneumonia due to COVID-19 virus   Acute metabolic encephalopathy   Chronic kidney disease, stage III (moderate)   Chronic atrial fibrillation (HCC)   1. Acute on chronic respiratory failure with hypoxia patient is on 3 L weaning 48-hour goal today on the NAG 2. COVID-19 virus infection resolving 3. Pneumonia due to COVID-19 improving clinically we will follow 4. Acute metabolic encephalopathy also significantly improved 5. Chronic kidney disease stage III following labs 6. Chronic atrial fibrillation rate controlled   I have personally seen and evaluated the patient, evaluated laboratory and imaging results, formulated the assessment and plan and placed orders. The Patient requires high complexity decision making with multiple systems involvement.  Rounds  were done with the Respiratory Therapy Director and Staff therapists and discussed with nursing staff also.  Allyne Gee, MD [facility] Pulmonary Critical Care Medicine Sleep Medicine

## 2020-01-04 DIAGNOSIS — G9341 Metabolic encephalopathy: Secondary | ICD-10-CM | POA: Diagnosis not present

## 2020-01-04 DIAGNOSIS — I482 Chronic atrial fibrillation, unspecified: Secondary | ICD-10-CM | POA: Diagnosis not present

## 2020-01-04 DIAGNOSIS — N183 Chronic kidney disease, stage 3 unspecified: Secondary | ICD-10-CM | POA: Diagnosis not present

## 2020-01-04 DIAGNOSIS — J9621 Acute and chronic respiratory failure with hypoxia: Secondary | ICD-10-CM | POA: Diagnosis not present

## 2020-01-04 LAB — PARATHYROID HORMONE, INTACT (NO CA): PTH: 6 pg/mL — ABNORMAL LOW (ref 15–65)

## 2020-01-04 LAB — VANCOMYCIN, TROUGH: Vancomycin Tr: 14 ug/mL — ABNORMAL LOW (ref 15–20)

## 2020-01-04 LAB — PTH, INTACT AND CALCIUM
Calcium, Total (PTH): 11.9 mg/dL — ABNORMAL HIGH (ref 8.6–10.2)
PTH: 5 pg/mL — ABNORMAL LOW (ref 15–65)

## 2020-01-04 NOTE — Progress Notes (Signed)
Pulmonary Critical Care Medicine [facility] [place]   PULMONARY CRITICAL CARE SERVICE  PROGRESS NOTE  Date of Service: 01/04/2020  [name]  EQA:[id number]  DOB: 12-05-1945   DOA: 12/12/2019  Referring Physician: Carron Curie, MD  HPI: Kyle [name] is a 74 y.o. male seen for follow up of Acute on Chronic Respiratory Failure.  Patient is on the NAG goal today is 72 hours  Medications: Reviewed on Rounds  Physical Exam:  Vitals: Temperature 97.4 pulse 63 respiratory rate 28 blood pressure is 115/58 saturations 100%  Ventilator Settings on the NAG  . General: Comfortable at this time . Eyes: Grossly normal lids, irises & conjunctiva . ENT: grossly tongue is normal . Neck: no obvious mass . Cardiovascular: S1 S2 normal no gallop . Respiratory: No rhonchi no rales are noted . Abdomen: soft . Skin: no rash seen on limited exam . Musculoskeletal: not rigid . Psychiatric:unable to assess . Neurologic: no seizure no involuntary movements         Lab Data:   Basic Metabolic Panel: Recent Labs  Lab 12/31/19 0538 12/31/19 2156 01/01/20 0446 01/02/20 2131 01/03/20 0925  NA 139  --   --   --  136  K 2.9* 5.0 3.9  --  3.1*  CL 103  --   --   --  102  CO2 28  --   --   --  27  GLUCOSE 149*  --   --   --  137*  BUN 54*  --   --   --  42*  CREATININE 0.77  --   --   --  0.51*  CALCIUM 12.6*  --   --  11.9* 11.9*    ABG: Recent Labs  Lab 12/28/19 1020 12/28/19 1046 12/31/19 1108  PHART 7.421 7.498* 7.439  PCO2ART 47.9 39.9 42.5  PO2ART 56.9* 169* 77.9*  HCO3 31.1* 31.1* 28.5*  O2SAT 90.6 99.5 96.6    Liver Function Tests: Recent Labs  Lab 12/31/19 0538  AST 55*  ALT 56*  ALKPHOS 158*  BILITOT 0.3  PROT 6.9  ALBUMIN 1.9*   No results for input(s): LIPASE, AMYLASE in the last 168 hours. No results for input(s): AMMONIA in the last 168 hours.  CBC: Recent Labs  Lab 12/28/19 1459 12/31/19 0538  WBC 14.2* 9.2  HGB 10.1* 7.8*  HCT  32.1* 23.6*  MCV 105.2* 100.4*  PLT 361 275    Cardiac Enzymes: No results for input(s): CKTOTAL, CKMB, CKMBINDEX, TROPONINI in the last 168 hours.  BNP (last 3 results) No results for input(s): BNP in the last 8760 hours.  ProBNP (last 3 results) No results for input(s): PROBNP in the last 8760 hours.  Radiological Exams: No results found.  Assessment/Plan Active Problems:   Acute on chronic respiratory failure with hypoxia (HCC)   COVID-19 virus infection   Pneumonia due to COVID-19 virus   Acute metabolic encephalopathy   Chronic kidney disease, stage III (moderate)   Chronic atrial fibrillation (HCC)   1. Acute on chronic respiratory failure with hypoxia patient is off the ventilator on the NAG goal 72 hours 2. COVID-19 virus infection treated improving 3. Pneumonia due to COVID-19 treated 4. Acute metabolic encephalopathy slow improvement 5. Chronic kidney disease stage III continue to follow 6. Chronic atrial fibrillation rate controlled   I have personally seen and evaluated the patient, evaluated laboratory and imaging results, formulated the assessment and plan and placed orders. The Patient requires high complexity decision making with  multiple systems involvement.  Rounds were done with the Respiratory Therapy Director and Staff therapists and discussed with nursing staff also.  Allyne Gee, MD [facility] Pulmonary Critical Care Medicine Sleep Medicine

## 2020-01-05 ENCOUNTER — Other Ambulatory Visit (HOSPITAL_COMMUNITY): Payer: Medicare Other

## 2020-01-05 DIAGNOSIS — J9621 Acute and chronic respiratory failure with hypoxia: Secondary | ICD-10-CM | POA: Diagnosis not present

## 2020-01-05 DIAGNOSIS — G9341 Metabolic encephalopathy: Secondary | ICD-10-CM | POA: Diagnosis not present

## 2020-01-05 DIAGNOSIS — N183 Chronic kidney disease, stage 3 unspecified: Secondary | ICD-10-CM | POA: Diagnosis not present

## 2020-01-05 DIAGNOSIS — I482 Chronic atrial fibrillation, unspecified: Secondary | ICD-10-CM | POA: Diagnosis not present

## 2020-01-05 LAB — COMPREHENSIVE METABOLIC PANEL
ALT: 34 U/L (ref 0–44)
AST: 37 U/L (ref 15–41)
Albumin: 1.8 g/dL — ABNORMAL LOW (ref 3.5–5.0)
Alkaline Phosphatase: 163 U/L — ABNORMAL HIGH (ref 38–126)
Anion gap: 9 (ref 5–15)
BUN: 39 mg/dL — ABNORMAL HIGH (ref 8–23)
CO2: 25 mmol/L (ref 22–32)
Calcium: 11.6 mg/dL — ABNORMAL HIGH (ref 8.9–10.3)
Chloride: 104 mmol/L (ref 98–111)
Creatinine, Ser: 0.49 mg/dL — ABNORMAL LOW (ref 0.61–1.24)
GFR calc Af Amer: >60 mL/min (ref >60)
GFR calc non Af Amer: >60 mL/min (ref >60)
Glucose, Bld: 116 mg/dL — ABNORMAL HIGH (ref 70–99)
Potassium: 3.5 mmol/L (ref 3.5–5.1)
Sodium: 138 mmol/L (ref 135–145)
Total Bilirubin: 0.4 mg/dL (ref 0.3–1.2)
Total Protein: 7 g/dL (ref 6.5–8.1)

## 2020-01-05 LAB — PROTEIN ELECTROPHORESIS, SERUM
A/G Ratio: 0.6 — ABNORMAL LOW (ref 0.7–1.7)
Albumin ELP: 2.4 g/dL — ABNORMAL LOW (ref 2.9–4.4)
Alpha-1-Globulin: 0.4 g/dL (ref 0.0–0.4)
Alpha-2-Globulin: 1 g/dL (ref 0.4–1.0)
Beta Globulin: 1.1 g/dL (ref 0.7–1.3)
Gamma Globulin: 1.9 g/dL — ABNORMAL HIGH (ref 0.4–1.8)
Globulin, Total: 4.3 g/dL — ABNORMAL HIGH (ref 2.2–3.9)
Total Protein ELP: 6.7 g/dL (ref 6.0–8.5)

## 2020-01-05 LAB — CBC
HCT: 26.3 % — ABNORMAL LOW (ref 39.0–52.0)
Hemoglobin: 8.6 g/dL — ABNORMAL LOW (ref 13.0–17.0)
MCH: 33.9 pg (ref 26.0–34.0)
MCHC: 32.7 g/dL (ref 30.0–36.0)
MCV: 103.5 fL — ABNORMAL HIGH (ref 80.0–100.0)
Platelets: 279 10*3/uL (ref 150–400)
RBC: 2.54 MIL/uL — ABNORMAL LOW (ref 4.22–5.81)
RDW: 19.5 % — ABNORMAL HIGH (ref 11.5–15.5)
WBC: 11.5 10*3/uL — ABNORMAL HIGH (ref 4.0–10.5)
nRBC: 0.5 % — ABNORMAL HIGH (ref 0.0–0.2)

## 2020-01-05 NOTE — Progress Notes (Addendum)
Pulmonary Critical Care Medicine [place]   PULMONARY CRITICAL CARE SERVICE  PROGRESS NOTE  Date of Service: 01/05/2020  Kyle [name]  [id number]:[id number]  DOB: 17-Apr-1946   DOA: 12/12/2019  Referring Physician: Merton Border, MD  HPI: Kyle [name] is a 74 y.o. male seen for follow up of Acute on Chronic Respiratory Failure.  Patient is currently capped on 1 L nasal cannula satting well no distress.  Medications: Reviewed on Rounds  Physical Exam:  Vitals: Pulse 67 respirations 20 BP 134/67 O2 sat 100% temp 97.8 1 L nasal cannula  Ventilator Settings 1 L nasal cannula  . General: Comfortable at this time . Eyes: Grossly normal lids, irises & conjunctiva . ENT: grossly tongue is normal . Neck: no obvious mass . Cardiovascular: S1 S2 normal no gallop . Respiratory: No rales or rhonchi noted . Abdomen: soft . Skin: no rash seen on limited exam . Musculoskeletal: not rigid . Psychiatric:unable to assess . Neurologic: no seizure no involuntary movements         Lab Data:   Basic Metabolic Panel: Recent Labs  Lab 12/31/19 0538 12/31/19 2156 01/01/20 0446 01/02/20 2131 01/03/20 0925 01/05/20 0504  NA 139  --   --   --  136 138  K 2.9* 5.0 3.9  --  3.1* 3.5  CL 103  --   --   --  102 104  CO2 28  --   --   --  27 25  GLUCOSE 149*  --   --   --  137* 116*  BUN 54*  --   --   --  42* 39*  CREATININE 0.77  --   --   --  0.51* 0.49*  CALCIUM 12.6*  --   --  11.9* 11.9* 11.6*    ABG: Recent Labs  Lab 12/31/19 1108  PHART 7.439  PCO2ART 42.5  PO2ART 77.9*  HCO3 28.5*  O2SAT 96.6    Liver Function Tests: Recent Labs  Lab 12/31/19 0538 01/05/20 0504  AST 55* 37  ALT 56* 34  ALKPHOS 158* 163*  BILITOT 0.3 0.4  PROT 6.9 7.0  ALBUMIN 1.9* 1.8*   No results for input(s): LIPASE, AMYLASE in the last 168 hours. No results for input(s): AMMONIA in the last 168 hours.  CBC: Recent Labs  Lab 12/31/19 0538 01/05/20 0504  WBC 9.2 11.5*   HGB 7.8* 8.6*  HCT 23.6* 26.3*  MCV 100.4* 103.5*  PLT 275 279    Cardiac Enzymes: No results for input(s): CKTOTAL, CKMB, CKMBINDEX, TROPONINI in the last 168 hours.  BNP (last 3 results) No results for input(s): BNP in the last 8760 hours.  ProBNP (last 3 results) No results for input(s): PROBNP in the last 8760 hours.  Radiological Exams: CT HEAD WO CONTRAST  Result Date: 01/05/2020 CLINICAL DATA:  Altered mental status.  Unresponsive. EXAM: CT HEAD WITHOUT CONTRAST TECHNIQUE: Contiguous axial images were obtained from the base of the skull through the vertex without intravenous contrast. COMPARISON:  08/28/2019 FINDINGS: Brain: Generalized atrophy. Chronic small-vessel ischemic changes of the white matter. 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 No sign of acute infarction, mass lesion, hemorrhage, hydrocephalus or extra-axial collection. Vascular: There is atherosclerotic calcification of the major vessels at the base of the brain. Skull: Negative Sinuses/Orbits: Clear/normal Other: None IMPRESSION: No acute finding by CT. Atrophy and chronic small-vessel ischemic change of the white matter. 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 Electronically Signed   By: Jan Fireman.D.  On: 01/05/2020 01:21    Assessment/Plan Active Problems:   Acute on chronic respiratory failure with hypoxia (HCC)   COVID-19 virus infection   Pneumonia due to COVID-19 virus   Acute metabolic encephalopathy   Chronic kidney disease, stage III (moderate)   Chronic atrial fibrillation (HCC)   1. Acute on chronic respiratory failure with hypoxia patient currently capping on 1 L nasal cannula continue this as tolerated.  Continue supportive measures and pulmonary toilet. 2. COVID-19 virus infection treated improving 3. Pneumonia due to COVID-19 treated 4. Acute metabolic encephalopathy slow improvement 5. Chronic kidney disease stage III continue to follow 6. Chronic atrial fibrillation rate  controlled   I have personally seen and evaluated the patient, evaluated laboratory and imaging results, formulated the assessment and plan and placed orders. The Patient requires high complexity decision making with multiple systems involvement.  Rounds were done with the Respiratory Therapy Director and Staff therapists and discussed with nursing staff also.  Yevonne Pax, MD [facility] Pulmonary Critical Care Medicine Sleep Medicine

## 2020-01-06 DIAGNOSIS — I482 Chronic atrial fibrillation, unspecified: Secondary | ICD-10-CM | POA: Diagnosis not present

## 2020-01-06 DIAGNOSIS — J9621 Acute and chronic respiratory failure with hypoxia: Secondary | ICD-10-CM | POA: Diagnosis not present

## 2020-01-06 DIAGNOSIS — N183 Chronic kidney disease, stage 3 unspecified: Secondary | ICD-10-CM | POA: Diagnosis not present

## 2020-01-06 DIAGNOSIS — G9341 Metabolic encephalopathy: Secondary | ICD-10-CM | POA: Diagnosis not present

## 2020-01-06 NOTE — Progress Notes (Addendum)
Pulmonary Critical Care Medicine [facility] [place]   PULMONARY CRITICAL CARE SERVICE  PROGRESS NOTE  Date of Service: 01/06/2020  Kyle [name]  JQZ:[id number]  DOB: May 19, 1946   DOA: 12/12/2019  Referring Physician: Carron Curie, MD  HPI: Kyle [name] is a 73 y.o. male seen for follow up of Acute on Chronic Respiratory Failure.  Patient mains capped at this time now for 24 hours on 1 L nasal cannula satting well no distress.  Medications: Reviewed on Rounds  Physical Exam:  Vitals: Pulse 66 respirations 25 BP 128/58 O2 sat 99% temp 97.4  Ventilator Settings 1 L nasal cannula  . General: Comfortable at this time . Eyes: Grossly normal lids, irises & conjunctiva . ENT: grossly tongue is normal . Neck: no obvious mass . Cardiovascular: S1 S2 normal no gallop . Respiratory: No rales rhonchi noted . Abdomen: soft . Skin: no rash seen on limited exam . Musculoskeletal: not rigid . Psychiatric:unable to assess . Neurologic: no seizure no involuntary movements         Lab Data:   Basic Metabolic Panel: Recent Labs  Lab 12/31/19 0538 12/31/19 2156 01/01/20 0446 01/02/20 2131 01/03/20 0925 01/05/20 0504  NA 139  --   --   --  136 138  K 2.9* 5.0 3.9  --  3.1* 3.5  CL 103  --   --   --  102 104  CO2 28  --   --   --  27 25  GLUCOSE 149*  --   --   --  137* 116*  BUN 54*  --   --   --  42* 39*  CREATININE 0.77  --   --   --  0.51* 0.49*  CALCIUM 12.6*  --   --  11.9* 11.9* 11.6*    ABG: Recent Labs  Lab 12/31/19 1108  PHART 7.439  PCO2ART 42.5  PO2ART 77.9*  HCO3 28.5*  O2SAT 96.6    Liver Function Tests: Recent Labs  Lab 12/31/19 0538 01/05/20 0504  AST 55* 37  ALT 56* 34  ALKPHOS 158* 163*  BILITOT 0.3 0.4  PROT 6.9 7.0  ALBUMIN 1.9* 1.8*   No results for input(s): LIPASE, AMYLASE in the last 168 hours. No results for input(s): AMMONIA in the last 168 hours.  CBC: Recent Labs  Lab 12/31/19 0538 01/05/20 0504  WBC 9.2 11.5*   HGB 7.8* 8.6*  HCT 23.6* 26.3*  MCV 100.4* 103.5*  PLT 275 279    Cardiac Enzymes: No results for input(s): CKTOTAL, CKMB, CKMBINDEX, TROPONINI in the last 168 hours.  BNP (last 3 results) No results for input(s): BNP in the last 8760 hours.  ProBNP (last 3 results) No results for input(s): PROBNP in the last 8760 hours.  Radiological Exams: CT HEAD WO CONTRAST  Result Date: 01/05/2020 CLINICAL DATA:  Altered mental status.  Unresponsive. EXAM: CT HEAD WITHOUT CONTRAST TECHNIQUE: Contiguous axial images were obtained from the base of the skull through the vertex without intravenous contrast. COMPARISON:  08/28/2019 FINDINGS: Brain: Generalized atrophy. Chronic small-vessel ischemic changes of the white matter. 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 No sign of acute infarction, mass lesion, hemorrhage, hydrocephalus or extra-axial collection. Vascular: There is atherosclerotic calcification of the major vessels at the base of the brain. Skull: Negative Sinuses/Orbits: Clear/normal Other: None IMPRESSION: No acute finding by CT. Atrophy and chronic small-vessel ischemic change of the white matter. 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 Electronically Signed   By: Paulina Fusi  M.D.   On: 01/05/2020 01:21    Assessment/Plan Active Problems:   Acute on chronic respiratory failure with hypoxia ([place])   COVID-19 virus infection   Pneumonia due to COVID-19 virus   Acute metabolic encephalopathy   Chronic kidney disease, stage III (moderate)   Chronic atrial fibrillation ([place])   1. Acute on chronic respiratory failure with hypoxia patient will continue with capping trials on 1 L nasal cannula continue supportive measures pulmonary toilet. 2. COVID-19 virus infection treated improving 3. Pneumonia due to COVID-19 treated 4. Acute metabolic encephalopathy slow improvement 5. Chronic kidney disease stage III continue to follow 6. Chronic atrial fibrillation rate controlled   I  have personally seen and evaluated the patient, evaluated laboratory and imaging results, formulated the assessment and plan and placed orders. The Patient requires high complexity decision making with multiple systems involvement.  Rounds were done with the Respiratory Therapy Director and Staff therapists and discussed with nursing staff also.  Allyne Gee, MD [facility] Pulmonary Critical Care Medicine Sleep Medicine

## 2020-01-07 DIAGNOSIS — I482 Chronic atrial fibrillation, unspecified: Secondary | ICD-10-CM | POA: Diagnosis not present

## 2020-01-07 DIAGNOSIS — N183 Chronic kidney disease, stage 3 unspecified: Secondary | ICD-10-CM | POA: Diagnosis not present

## 2020-01-07 DIAGNOSIS — G9341 Metabolic encephalopathy: Secondary | ICD-10-CM | POA: Diagnosis not present

## 2020-01-07 DIAGNOSIS — J9621 Acute and chronic respiratory failure with hypoxia: Secondary | ICD-10-CM | POA: Diagnosis not present

## 2020-01-07 NOTE — Progress Notes (Addendum)
Pulmonary Critical Care Medicine [facility] [place]   PULMONARY CRITICAL CARE SERVICE  PROGRESS NOTE  Date of Service: 01/07/2020  Kyle [name]  [id number]  DOB: 16-Apr-1946   DOA: 12/12/2019  Referring Physician: Carron Curie, MD  HPI: Kyle [name] is a 74 y.o. male seen for follow up of Acute on Chronic Respiratory Failure.  Patient is been capped for 48 hours on 1 L nasal cannula satting well no distress.  Medications: Reviewed on Rounds  Physical Exam:  Vitals: Pulse 71 respirations 34 BP 157/73 O2 sat 99% temp 98.6   Ventilator Settings 1 L  . General: Comfortable at this time . Eyes: Grossly normal lids, irises & conjunctiva . ENT: grossly tongue is normal . Neck: no obvious mass . Cardiovascular: S1 S2 normal no gallop . Respiratory: No rales or rhonchi noted . Abdomen: soft . Skin: no rash seen on limited exam . Musculoskeletal: not rigid . Psychiatric:unable to assess . Neurologic: no seizure no involuntary movements         Lab Data:   Basic Metabolic Panel: Recent Labs  Lab 12/31/19 2156 01/01/20 0446 01/02/20 2131 01/03/20 0925 01/05/20 0504  NA  --   --   --  136 138  K 5.0 3.9  --  3.1* 3.5  CL  --   --   --  102 104  CO2  --   --   --  27 25  GLUCOSE  --   --   --  137* 116*  BUN  --   --   --  42* 39*  CREATININE  --   --   --  0.51* 0.49*  CALCIUM  --   --  11.9* 11.9* 11.6*    ABG: No results for input(s): PHART, PCO2ART, PO2ART, HCO3, O2SAT in the last 168 hours.  Liver Function Tests: Recent Labs  Lab 01/05/20 0504  AST 37  ALT 34  ALKPHOS 163*  BILITOT 0.4  PROT 7.0  ALBUMIN 1.8*   No results for input(s): LIPASE, AMYLASE in the last 168 hours. No results for input(s): AMMONIA in the last 168 hours.  CBC: Recent Labs  Lab 01/05/20 0504  WBC 11.5*  HGB 8.6*  HCT 26.3*  MCV 103.5*  PLT 279    Cardiac Enzymes: No results for input(s): CKTOTAL, CKMB, CKMBINDEX, TROPONINI in the last 168  hours.  BNP (last 3 results) No results for input(s): BNP in the last 8760 hours.  ProBNP (last 3 results) No results for input(s): PROBNP in the last 8760 hours.  Radiological Exams: No results found.  Assessment/Plan Active Problems:   Acute on chronic respiratory failure with hypoxia (HCC)   COVID-19 virus infection   Pneumonia due to COVID-19 virus   Acute metabolic encephalopathy   Chronic kidney disease, stage III (moderate)   Chronic atrial fibrillation (HCC)   1. Acute on chronic respiratory failure with hypoxia patient will continue with capping trials on 1 L nasal cannula continue supportive measures pulmonary toilet. 2. COVID-19 virus infection treated improving 3. Pneumonia due to COVID-19 treated 4. Acute metabolic encephalopathy slow improvement 5. Chronic kidney disease stage III continue to follow 6. Chronic atrial fibrillation rate controlled   I have personally seen and evaluated the patient, evaluated laboratory and imaging results, formulated the assessment and plan and placed orders. The Patient requires high complexity decision making with multiple systems involvement.  Rounds were done with the Respiratory Therapy Director and Staff therapists and discussed with nursing staff also.  Allyne Gee, MD [facility] Pulmonary Critical Care Medicine Sleep Medicine

## 2020-01-08 DIAGNOSIS — N183 Chronic kidney disease, stage 3 unspecified: Secondary | ICD-10-CM | POA: Diagnosis not present

## 2020-01-08 DIAGNOSIS — I482 Chronic atrial fibrillation, unspecified: Secondary | ICD-10-CM | POA: Diagnosis not present

## 2020-01-08 DIAGNOSIS — G9341 Metabolic encephalopathy: Secondary | ICD-10-CM | POA: Diagnosis not present

## 2020-01-08 DIAGNOSIS — J9621 Acute and chronic respiratory failure with hypoxia: Secondary | ICD-10-CM | POA: Diagnosis not present

## 2020-01-08 LAB — CBC
HCT: 25.7 % — ABNORMAL LOW (ref 39.0–52.0)
Hemoglobin: 8.2 g/dL — ABNORMAL LOW (ref 13.0–17.0)
MCH: 32.8 pg (ref 26.0–34.0)
MCHC: 31.9 g/dL (ref 30.0–36.0)
MCV: 102.8 fL — ABNORMAL HIGH (ref 80.0–100.0)
Platelets: 306 10*3/uL (ref 150–400)
RBC: 2.5 MIL/uL — ABNORMAL LOW (ref 4.22–5.81)
RDW: 19.6 % — ABNORMAL HIGH (ref 11.5–15.5)
WBC: 11 10*3/uL — ABNORMAL HIGH (ref 4.0–10.5)
nRBC: 0.3 % — ABNORMAL HIGH (ref 0.0–0.2)

## 2020-01-08 LAB — BASIC METABOLIC PANEL
Anion gap: 11 (ref 5–15)
BUN: 32 mg/dL — ABNORMAL HIGH (ref 8–23)
CO2: 29 mmol/L (ref 22–32)
Calcium: 12 mg/dL — ABNORMAL HIGH (ref 8.9–10.3)
Chloride: 96 mmol/L — ABNORMAL LOW (ref 98–111)
Creatinine, Ser: 0.38 mg/dL — ABNORMAL LOW (ref 0.61–1.24)
GFR calc Af Amer: >60 mL/min (ref >60)
GFR calc non Af Amer: >60 mL/min (ref >60)
Glucose, Bld: 117 mg/dL — ABNORMAL HIGH (ref 70–99)
Potassium: 3.4 mmol/L — ABNORMAL LOW (ref 3.5–5.1)
Sodium: 136 mmol/L (ref 135–145)

## 2020-01-08 LAB — POTASSIUM: Potassium: 4.4 mmol/L (ref 3.5–5.1)

## 2020-01-08 NOTE — Progress Notes (Signed)
Pulmonary Critical Care Medicine [facility] [place]   PULMONARY CRITICAL CARE SERVICE  PROGRESS NOTE  Date of Service: 01/08/2020  Kyle [name]  ASN:[id number]  DOB: [birth year]   DOA: 12/12/2019  Referring Physician: Carron Curie, MD  HPI: Kyle [name] is a 74 y.o. male seen for follow up of Acute on Chronic Respiratory Failure.  Patient is on capping trials has been doing relatively well but still not quite ready for decannulation right now is on 1 L oxygen which he seems to be tolerating  Medications: Reviewed on Rounds  Physical Exam:  Vitals: Temperature is 98.6 pulse 70 respiratory 24 blood pressure 122/62 saturations 98%  Ventilator Settings capping off the ventilator  . General: Comfortable at this time . Eyes: Grossly normal lids, irises & conjunctiva . ENT: grossly tongue is normal . Neck: no obvious mass . Cardiovascular: S1 S2 normal no gallop . Respiratory: Scattered rhonchi expansion is equal . Abdomen: soft . Skin: no rash seen on limited exam . Musculoskeletal: not rigid . Psychiatric:unable to assess . Neurologic: no seizure no involuntary movements         Lab Data:   Basic Metabolic Panel: Recent Labs  Lab 01/02/20 2131 01/03/20 0925 01/05/20 0504 01/08/20 0805  NA  --  136 138 136  K  --  3.1* 3.5 3.4*  CL  --  102 104 96*  CO2  --  27 25 29   GLUCOSE  --  137* 116* 117*  BUN  --  42* 39* 32*  CREATININE  --  0.51* 0.49* 0.38*  CALCIUM 11.9* 11.9* 11.6* 12.0*    ABG: No results for input(s): PHART, PCO2ART, PO2ART, HCO3, O2SAT in the last 168 hours.  Liver Function Tests: Recent Labs  Lab 01/05/20 0504  AST 37  ALT 34  ALKPHOS 163*  BILITOT 0.4  PROT 7.0  ALBUMIN 1.8*   No results for input(s): LIPASE, AMYLASE in the last 168 hours. No results for input(s): AMMONIA in the last 168 hours.  CBC: Recent Labs  Lab 01/05/20 0504 01/08/20 0805  WBC 11.5* 11.0*  HGB 8.6* 8.2*  HCT 26.3* 25.7*  MCV 103.5* 102.8*   PLT 279 306    Cardiac Enzymes: No results for input(s): CKTOTAL, CKMB, CKMBINDEX, TROPONINI in the last 168 hours.  BNP (last 3 results) No results for input(s): BNP in the last 8760 hours.  ProBNP (last 3 results) No results for input(s): PROBNP in the last 8760 hours.  Radiological Exams: No results found.  Assessment/Plan Active Problems:   Acute on chronic respiratory failure with hypoxia (HCC)   COVID-19 virus infection   Pneumonia due to COVID-19 virus   Acute metabolic encephalopathy   Chronic kidney disease, stage III (moderate)   Chronic atrial fibrillation (HCC)   1. Acute on chronic respiratory failure with hypoxia plan continue with the capping trials as ordered patient seems to be tolerating it well no decannulation right now 2. COVID-19 virus infection in resolution phase 3. Pneumonia due to COVID-19 treated 4. Metabolic encephalopathy no change we will continue to follow 5. Chronic kidney disease stage III at baseline 6. Chronic atrial fibrillation rate controlled   I have personally seen and evaluated the patient, evaluated laboratory and imaging results, formulated the assessment and plan and placed orders. The Patient requires high complexity decision making with multiple systems involvement.  Rounds were done with the Respiratory Therapy Director and Staff therapists and discussed with nursing staff also.  01/10/20, MD [facility] Pulmonary Critical Care  Medicine Sleep Medicine

## 2020-01-09 DIAGNOSIS — J9621 Acute and chronic respiratory failure with hypoxia: Secondary | ICD-10-CM | POA: Diagnosis not present

## 2020-01-09 DIAGNOSIS — G9341 Metabolic encephalopathy: Secondary | ICD-10-CM | POA: Diagnosis not present

## 2020-01-09 DIAGNOSIS — I482 Chronic atrial fibrillation, unspecified: Secondary | ICD-10-CM | POA: Diagnosis not present

## 2020-01-09 DIAGNOSIS — N183 Chronic kidney disease, stage 3 unspecified: Secondary | ICD-10-CM | POA: Diagnosis not present

## 2020-01-09 NOTE — Progress Notes (Signed)
Pulmonary Critical Care Medicine [facility] [place]   PULMONARY CRITICAL CARE SERVICE  PROGRESS NOTE  Date of Service: 01/09/2020  Kyle [name]  WUJ:[id number]  DOB: 08/11/1946   DOA: 12/12/2019  Referring Physician: Carron Curie, MD  HPI: Kyle [name] is a 74 y.o. male seen for follow up of Acute on Chronic Respiratory Failure.  Patient currently is capping has been on 1 L of oxygen is actually doing quite well ready for decannulation  Medications: Reviewed on Rounds  Physical Exam:  Vitals: Temperature 98.5 pulse 76 respiratory rate 35 blood pressure is 153/67 saturations 98%  Ventilator Settings capping on 1 L  . General: Comfortable at this time . Eyes: Grossly normal lids, irises & conjunctiva . ENT: grossly tongue is normal . Neck: no obvious mass . Cardiovascular: S1 S2 normal no gallop . Respiratory: No rhonchi no rales are noted at this time . Abdomen: soft . Skin: no rash seen on limited exam . Musculoskeletal: not rigid . Psychiatric:unable to assess . Neurologic: no seizure no involuntary movements         Lab Data:   Basic Metabolic Panel: Recent Labs  Lab 01/02/20 2131 01/03/20 0925 01/05/20 0504 01/08/20 0805 01/08/20 1926  NA  --  136 138 136  --   K  --  3.1* 3.5 3.4* 4.4  CL  --  102 104 96*  --   CO2  --  27 25 29   --   GLUCOSE  --  137* 116* 117*  --   BUN  --  42* 39* 32*  --   CREATININE  --  0.51* 0.49* 0.38*  --   CALCIUM 11.9* 11.9* 11.6* 12.0*  --     ABG: No results for input(s): PHART, PCO2ART, PO2ART, HCO3, O2SAT in the last 168 hours.  Liver Function Tests: Recent Labs  Lab 01/05/20 0504  AST 37  ALT 34  ALKPHOS 163*  BILITOT 0.4  PROT 7.0  ALBUMIN 1.8*   No results for input(s): LIPASE, AMYLASE in the last 168 hours. No results for input(s): AMMONIA in the last 168 hours.  CBC: Recent Labs  Lab 01/05/20 0504 01/08/20 0805  WBC 11.5* 11.0*  HGB 8.6* 8.2*  HCT 26.3* 25.7*  MCV 103.5* 102.8*   PLT 279 306    Cardiac Enzymes: No results for input(s): CKTOTAL, CKMB, CKMBINDEX, TROPONINI in the last 168 hours.  BNP (last 3 results) No results for input(s): BNP in the last 8760 hours.  ProBNP (last 3 results) No results for input(s): PROBNP in the last 8760 hours.  Radiological Exams: No results found.  Assessment/Plan Active Problems:   Acute on chronic respiratory failure with hypoxia (HCC)   COVID-19 virus infection   Pneumonia due to COVID-19 virus   Acute metabolic encephalopathy   Chronic kidney disease, stage III (moderate)   Chronic atrial fibrillation (HCC)   1. Acute on chronic respiratory failure with hypoxia plan is to proceed to decannulation 2. COVID-19 virus infection resolved 3. Pneumonia due to COVID-19 clinically improved 4. Acute metabolic encephalopathy this has improved we will need to continue to monitor 5. Chronic kidney disease following labs 6. Chronic atrial fibrillation rate now rate is controlled   I have personally seen and evaluated the patient, evaluated laboratory and imaging results, formulated the assessment and plan and placed orders. The Patient requires high complexity decision making with multiple systems involvement.  Rounds were done with the Respiratory Therapy Director and Staff therapists and discussed with nursing staff also.  Allyne Gee, MD [facility] Pulmonary Critical Care Medicine Sleep Medicine

## 2020-01-10 DIAGNOSIS — N183 Chronic kidney disease, stage 3 unspecified: Secondary | ICD-10-CM | POA: Diagnosis not present

## 2020-01-10 DIAGNOSIS — I482 Chronic atrial fibrillation, unspecified: Secondary | ICD-10-CM | POA: Diagnosis not present

## 2020-01-10 DIAGNOSIS — G9341 Metabolic encephalopathy: Secondary | ICD-10-CM | POA: Diagnosis not present

## 2020-01-10 DIAGNOSIS — J9621 Acute and chronic respiratory failure with hypoxia: Secondary | ICD-10-CM | POA: Diagnosis not present

## 2020-01-10 NOTE — Progress Notes (Signed)
Pulmonary Critical Care Medicine [facility] [place]   PULMONARY CRITICAL CARE SERVICE  PROGRESS NOTE  Date of Service: 01/10/2020  Kyle [name]  OBS:[id number]  DOB: [birth year]   DOA: 12/12/2019  Referring Physician: Carron Curie, MD  HPI: Kyle [name] is a 74 y.o. male seen for follow up of Acute on Chronic Respiratory Failure.  Patient is doing well after decannulation right now is on room air no issues  Medications: Reviewed on Rounds  Physical Exam:  Vitals: Temperature 97.6 pulse 69 respiratory rate 20 blood pressure was 135/59 saturations 98%  Ventilator Settings decannulated on room air  . General: Comfortable at this time . Eyes: Grossly normal lids, irises & conjunctiva . ENT: grossly tongue is normal . Neck: no obvious mass . Cardiovascular: S1 S2 normal no gallop . Respiratory: No rhonchi no rales are noted . Abdomen: soft . Skin: no rash seen on limited exam . Musculoskeletal: not rigid . Psychiatric:unable to assess . Neurologic: no seizure no involuntary movements         Lab Data:   Basic Metabolic Panel: Recent Labs  Lab 01/05/20 0504 01/08/20 0805 01/08/20 1926  NA 138 136  --   K 3.5 3.4* 4.4  CL 104 96*  --   CO2 25 29  --   GLUCOSE 116* 117*  --   BUN 39* 32*  --   CREATININE 0.49* 0.38*  --   CALCIUM 11.6* 12.0*  --     ABG: No results for input(s): PHART, PCO2ART, PO2ART, HCO3, O2SAT in the last 168 hours.  Liver Function Tests: Recent Labs  Lab 01/05/20 0504  AST 37  ALT 34  ALKPHOS 163*  BILITOT 0.4  PROT 7.0  ALBUMIN 1.8*   No results for input(s): LIPASE, AMYLASE in the last 168 hours. No results for input(s): AMMONIA in the last 168 hours.  CBC: Recent Labs  Lab 01/05/20 0504 01/08/20 0805  WBC 11.5* 11.0*  HGB 8.6* 8.2*  HCT 26.3* 25.7*  MCV 103.5* 102.8*  PLT 279 306    Cardiac Enzymes: No results for input(s): CKTOTAL, CKMB, CKMBINDEX, TROPONINI in the last 168 hours.  BNP (last 3  results) No results for input(s): BNP in the last 8760 hours.  ProBNP (last 3 results) No results for input(s): PROBNP in the last 8760 hours.  Radiological Exams: No results found.  Assessment/Plan Active Problems:   Acute on chronic respiratory failure with hypoxia (HCC)   COVID-19 virus infection   Pneumonia due to COVID-19 virus   Acute metabolic encephalopathy   Chronic kidney disease, stage III (moderate)   Chronic atrial fibrillation (HCC)   1. Acute on chronic respiratory failure with hypoxia plan is to continue with the oxygen on an as-needed basis successfully decannulated 2. COVID-19 virus infection now is in resolution phase we will continue with supportive care 3. Pneumonia due to COVID-19 treated 4. encephalopathy no change 5. Chronic kidney disease stage III no change we will continue with supportive care 6. Chronic atrial fibrillation rate controlled   I have personally seen and evaluated the patient, evaluated laboratory and imaging results, formulated the assessment and plan and placed orders. The Patient requires high complexity decision making with multiple systems involvement.  Rounds were done with the Respiratory Therapy Director and Staff therapists and discussed with nursing staff also.  Yevonne Pax, MD [facility] Pulmonary Critical Care Medicine Sleep Medicine

## 2020-01-11 ENCOUNTER — Other Ambulatory Visit (HOSPITAL_COMMUNITY): Payer: Medicare Other

## 2020-01-11 DIAGNOSIS — N183 Chronic kidney disease, stage 3 unspecified: Secondary | ICD-10-CM | POA: Diagnosis not present

## 2020-01-11 DIAGNOSIS — I482 Chronic atrial fibrillation, unspecified: Secondary | ICD-10-CM | POA: Diagnosis not present

## 2020-01-11 DIAGNOSIS — G9341 Metabolic encephalopathy: Secondary | ICD-10-CM | POA: Diagnosis not present

## 2020-01-11 DIAGNOSIS — J9621 Acute and chronic respiratory failure with hypoxia: Secondary | ICD-10-CM | POA: Diagnosis not present

## 2020-01-11 LAB — BLOOD GAS, ARTERIAL
Acid-Base Excess: 6.2 mmol/L — ABNORMAL HIGH (ref 0.0–2.0)
Acid-Base Excess: 4.7 mmol/L — ABNORMAL HIGH (ref 0.0–2.0)
Bicarbonate: 31.3 mmol/L — ABNORMAL HIGH (ref 20.0–28.0)
Bicarbonate: 28.2 mmol/L — ABNORMAL HIGH (ref 20.0–28.0)
FIO2: 40
FIO2: 70
O2 Saturation: 87.5 %
O2 Saturation: 99 %
Patient temperature: 37
Patient temperature: 36.6
pCO2 arterial: 55 mmHg — ABNORMAL HIGH (ref 32.0–48.0)
pCO2 arterial: 38 mmHg (ref 32.0–48.0)
pH, Arterial: 7.374 (ref 7.350–7.450)
pH, Arterial: 7.482 — ABNORMAL HIGH (ref 7.350–7.450)
pO2, Arterial: 60.1 mmHg — ABNORMAL LOW (ref 83.0–108.0)
pO2, Arterial: 106 mmHg (ref 83.0–108.0)

## 2020-01-11 LAB — COMPREHENSIVE METABOLIC PANEL
ALT: 38 U/L (ref 0–44)
AST: 52 U/L — ABNORMAL HIGH (ref 15–41)
Albumin: 2.4 g/dL — ABNORMAL LOW (ref 3.5–5.0)
Alkaline Phosphatase: 174 U/L — ABNORMAL HIGH (ref 38–126)
Anion gap: 10 (ref 5–15)
BUN: 28 mg/dL — ABNORMAL HIGH (ref 8–23)
CO2: 28 mmol/L (ref 22–32)
Calcium: 12 mg/dL — ABNORMAL HIGH (ref 8.9–10.3)
Chloride: 103 mmol/L (ref 98–111)
Creatinine, Ser: 0.52 mg/dL — ABNORMAL LOW (ref 0.61–1.24)
GFR calc Af Amer: >60 mL/min (ref >60)
GFR calc non Af Amer: >60 mL/min (ref >60)
Glucose, Bld: 125 mg/dL — ABNORMAL HIGH (ref 70–99)
Potassium: 3.7 mmol/L (ref 3.5–5.1)
Sodium: 141 mmol/L (ref 135–145)
Total Bilirubin: 0.5 mg/dL (ref 0.3–1.2)
Total Protein: 8.2 g/dL — ABNORMAL HIGH (ref 6.5–8.1)

## 2020-01-11 LAB — NOVEL CORONAVIRUS, NAA (HOSP ORDER, SEND-OUT TO REF LAB; TAT 18-24 HRS): SARS-CoV-2, NAA: NOT DETECTED
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
## 2020-01-12 DIAGNOSIS — N183 Chronic kidney disease, stage 3 unspecified: Secondary | ICD-10-CM | POA: Diagnosis not present

## 2020-01-12 DIAGNOSIS — J9621 Acute and chronic respiratory failure with hypoxia: Secondary | ICD-10-CM | POA: Diagnosis not present

## 2020-01-12 DIAGNOSIS — I482 Chronic atrial fibrillation, unspecified: Secondary | ICD-10-CM | POA: Diagnosis not present

## 2020-01-12 DIAGNOSIS — G9341 Metabolic encephalopathy: Secondary | ICD-10-CM | POA: Diagnosis not present
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
## 2020-01-13 DIAGNOSIS — N183 Chronic kidney disease, stage 3 unspecified: Secondary | ICD-10-CM | POA: Diagnosis not present

## 2020-01-13 DIAGNOSIS — J9621 Acute and chronic respiratory failure with hypoxia: Secondary | ICD-10-CM | POA: Diagnosis not present

## 2020-01-13 DIAGNOSIS — U071 COVID-19: Secondary | ICD-10-CM | POA: Diagnosis not present

## 2020-01-13 DIAGNOSIS — I482 Chronic atrial fibrillation, unspecified: Secondary | ICD-10-CM | POA: Diagnosis not present

## 2020-01-13 NOTE — Progress Notes (Addendum)
Pulmonary Critical Care Medicine [facility] [place]   PULMONARY CRITICAL CARE SERVICE  PROGRESS NOTE  Date of Service: 01/13/2020  Kyle [name]  [id number]  DOB: 1946-08-17   DOA: 12/12/2019  Referring Physician: Carron Curie, MD  HPI: Kyle [name] is a 74 y.o. male seen for follow up of Acute on Chronic Respiratory Failure.  Patient remains on 2 L nasal cannula satting well no distress.  Medications: Reviewed on Rounds  Physical Exam:  Vitals: Pulse 88 respirations 18 BP 142/70 O2 sat 100% temp 97.0  Ventilator Settings 2 L nasal cannula  . General: Comfortable at this time . Eyes: Grossly normal lids, irises & conjunctiva . ENT: grossly tongue is normal . Neck: no obvious mass . Cardiovascular: S1 S2 normal no gallop . Respiratory: No rales or rhonchi noted . Abdomen: soft . Skin: no rash seen on limited exam . Musculoskeletal: not rigid . Psychiatric:unable to assess . Neurologic: no seizure no involuntary movements         Lab Data:   Basic Metabolic Panel: Recent Labs  Lab 01/08/20 0805 01/08/20 1926 01/11/20 0551  NA 136  --  141  K 3.4* 4.4 3.7  CL 96*  --  103  CO2 29  --  28  GLUCOSE 117*  --  125*  BUN 32*  --  28*  CREATININE 0.38*  --  0.52*  CALCIUM 12.0*  --  12.0*    ABG: Recent Labs  Lab 01/11/20 0529 01/11/20 0938  PHART 7.374 7.482*  PCO2ART 55.0* 38.0  PO2ART 60.1* 106  HCO3 31.3* 28.2*  O2SAT 87.5 99.0    Liver Function Tests: Recent Labs  Lab 01/11/20 0551  AST 52*  ALT 38  ALKPHOS 174*  BILITOT 0.5  PROT 8.2*  ALBUMIN 2.4*   No results for input(s): LIPASE, AMYLASE in the last 168 hours. No results for input(s): AMMONIA in the last 168 hours.  CBC: Recent Labs  Lab 01/08/20 0805  WBC 11.0*  HGB 8.2*  HCT 25.7*  MCV 102.8*  PLT 306    Cardiac Enzymes: No results for input(s): CKTOTAL, CKMB, CKMBINDEX, TROPONINI in the last 168 hours.  BNP (last 3 results) No results for input(s): BNP  in the last 8760 hours.  ProBNP (last 3 results) No results for input(s): PROBNP in the last 8760 hours.  Radiological Exams: No results found.  Assessment/Plan Active Problems:   Acute on chronic respiratory failure with hypoxia (HCC)   COVID-19 virus infection   Pneumonia due to COVID-19 virus   Acute metabolic encephalopathy   Chronic kidney disease, stage III (moderate)   Chronic atrial fibrillation (HCC)   1. Acute on chronic respiratory failure hypoxia plan is to continue with oxygen titration down as tolerated 2. COVID-19 virus infection in resolution phase 3. Pneumonia due to COVID-19 slow improvement pleural scarring noted on the latest chest film. Pleural scar noted on the latest chest film. 4. metabolic encephalopathy improved 5. Chronic kidney disease stage III slowly improving we will continue to follow to baseline 6. Chronic atrial fibrillation rate controlled   I have personally seen and evaluated the patient, evaluated laboratory and imaging results, formulated the assessment and plan and placed orders. The Patient requires high complexity decision making with multiple systems involvement.  Rounds were done with the Respiratory Therapy Director and Staff therapists and discussed with nursing staff also.  Yevonne Pax, MD [facility] Pulmonary Critical Care Medicine Sleep Medicine

## 2020-01-15 ENCOUNTER — Other Ambulatory Visit (HOSPITAL_COMMUNITY): Payer: Medicare Other

## 2020-01-15 DIAGNOSIS — N183 Chronic kidney disease, stage 3 unspecified: Secondary | ICD-10-CM | POA: Diagnosis not present

## 2020-01-15 DIAGNOSIS — I482 Chronic atrial fibrillation, unspecified: Secondary | ICD-10-CM | POA: Diagnosis not present

## 2020-01-15 DIAGNOSIS — G9341 Metabolic encephalopathy: Secondary | ICD-10-CM | POA: Diagnosis not present

## 2020-01-15 DIAGNOSIS — J9621 Acute and chronic respiratory failure with hypoxia: Secondary | ICD-10-CM | POA: Diagnosis not present

## 2020-01-15 LAB — CBC
HCT: 27 % — ABNORMAL LOW (ref 39.0–52.0)
Hemoglobin: 8.4 g/dL — ABNORMAL LOW (ref 13.0–17.0)
MCH: 33.5 pg (ref 26.0–34.0)
MCHC: 31.1 g/dL (ref 30.0–36.0)
MCV: 107.6 fL — ABNORMAL HIGH (ref 80.0–100.0)
Platelets: 325 10*3/uL (ref 150–400)
RBC: 2.51 MIL/uL — ABNORMAL LOW (ref 4.22–5.81)
RDW: 18.6 % — ABNORMAL HIGH (ref 11.5–15.5)
WBC: 9.7 10*3/uL (ref 4.0–10.5)
nRBC: 0.2 % (ref 0.0–0.2)

## 2020-01-15 LAB — BASIC METABOLIC PANEL
Anion gap: 13 (ref 5–15)
BUN: 31 mg/dL — ABNORMAL HIGH (ref 8–23)
CO2: 27 mmol/L (ref 22–32)
Calcium: 11.2 mg/dL — ABNORMAL HIGH (ref 8.9–10.3)
Chloride: 105 mmol/L (ref 98–111)
Creatinine, Ser: 0.5 mg/dL — ABNORMAL LOW (ref 0.61–1.24)
GFR calc Af Amer: >60 mL/min (ref >60)
GFR calc non Af Amer: >60 mL/min (ref >60)
Glucose, Bld: 135 mg/dL — ABNORMAL HIGH (ref 70–99)
Potassium: 3.1 mmol/L — ABNORMAL LOW (ref 3.5–5.1)
Sodium: 145 mmol/L (ref 135–145)

## 2020-01-15 LAB — NOVEL CORONAVIRUS, NAA (HOSP ORDER, SEND-OUT TO REF LAB; TAT 18-24 HRS): SARS-CoV-2, NAA: NOT DETECTED

## 2020-01-15 LAB — URINALYSIS, ROUTINE W REFLEX MICROSCOPIC
Bacteria, UA: NONE SEEN
Bilirubin Urine: NEGATIVE
Glucose, UA: NEGATIVE mg/dL
Hgb urine dipstick: NEGATIVE
Ketones, ur: NEGATIVE mg/dL
Nitrite: NEGATIVE
Protein, ur: 30 mg/dL — AB
Specific Gravity, Urine: 1.013 (ref 1.005–1.030)
pH: 8 (ref 5.0–8.0)
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
## 2020-01-16 DIAGNOSIS — J9621 Acute and chronic respiratory failure with hypoxia: Secondary | ICD-10-CM | POA: Diagnosis not present

## 2020-01-16 DIAGNOSIS — N183 Chronic kidney disease, stage 3 unspecified: Secondary | ICD-10-CM | POA: Diagnosis not present

## 2020-01-16 DIAGNOSIS — I482 Chronic atrial fibrillation, unspecified: Secondary | ICD-10-CM | POA: Diagnosis not present

## 2020-01-16 DIAGNOSIS — G9341 Metabolic encephalopathy: Secondary | ICD-10-CM | POA: Diagnosis not present

## 2020-01-16 LAB — URINE CULTURE: Culture: NO GROWTH

## 2020-01-16 LAB — POTASSIUM: Potassium: 3.5 mmol/L (ref 3.5–5.1)
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
## 2020-01-17 DIAGNOSIS — J9621 Acute and chronic respiratory failure with hypoxia: Secondary | ICD-10-CM | POA: Diagnosis not present

## 2020-01-17 DIAGNOSIS — G9341 Metabolic encephalopathy: Secondary | ICD-10-CM | POA: Diagnosis not present

## 2020-01-17 DIAGNOSIS — I482 Chronic atrial fibrillation, unspecified: Secondary | ICD-10-CM | POA: Diagnosis not present

## 2020-01-17 DIAGNOSIS — N183 Chronic kidney disease, stage 3 unspecified: Secondary | ICD-10-CM | POA: Diagnosis not present

## 2020-01-17 LAB — VANCOMYCIN, TROUGH
Vancomycin Tr: 23 ug/mL (ref 15–20)
Vancomycin Tr: 17 ug/mL (ref 15–20)

## 2020-01-17 NOTE — Progress Notes (Addendum)
Pulmonary Critical Care Medicine [facility] [place]   PULMONARY CRITICAL CARE SERVICE  PROGRESS NOTE  Date of Service: 01/17/2020  Branch Pacitti  [id number]  DOB: 09-27-1946   DOA: 12/12/2019  Referring Physician: Carron Curie, MD  HPI: Kyle [name] is a 74 y.o. male seen for follow up of Acute on Chronic Respiratory Failure.  Patient was decannulated on February 16 doing well on roomair at time.  Medications: Reviewed on Rounds  Physical Exam:  Vitals: Pulse 99 respirations 33 BP 142/78 O2 sat 97% temp 100.2  Ventilator Settings room air  . General: Comfortable at this time . Eyes: Grossly normal lids, irises & conjunctiva . ENT: grossly tongue is normal . Neck: no obvious mass . Cardiovascular: S1 S2 normal no gallop . Respiratory: No rales or rhonchi noted . Abdomen: soft . Skin: no rash seen on limited exam . Musculoskeletal: not rigid . Psychiatric:unable to assess . Neurologic: no seizure no involuntary movements         Lab Data:   Basic Metabolic Panel: Recent Labs  Lab 01/11/20 0551 01/15/20 1010 01/16/20 1152  NA 141 145  --   K 3.7 3.1* 3.5  CL 103 105  --   CO2 28 27  --   GLUCOSE 125* 135*  --   BUN 28* 31*  --   CREATININE 0.52* 0.50*  --   CALCIUM 12.0* 11.2*  --     ABG: Recent Labs  Lab 01/11/20 0529 01/11/20 0938  PHART 7.374 7.482*  PCO2ART 55.0* 38.0  PO2ART 60.1* 106  HCO3 31.3* 28.2*  O2SAT 87.5 99.0    Liver Function Tests: Recent Labs  Lab 01/11/20 0551  AST 52*  ALT 38  ALKPHOS 174*  BILITOT 0.5  PROT 8.2*  ALBUMIN 2.4*   No results for input(s): LIPASE, AMYLASE in the last 168 hours. No results for input(s): AMMONIA in the last 168 hours.  CBC: Recent Labs  Lab 01/15/20 1010  WBC 9.7  HGB 8.4*  HCT 27.0*  MCV 107.6*  PLT 325    Cardiac Enzymes: No results for input(s): CKTOTAL, CKMB, CKMBINDEX, TROPONINI in the last 168 hours.  BNP (last 3 results) No results for input(s): BNP in  the last 8760 hours.  ProBNP (last 3 results) No results for input(s): PROBNP in the last 8760 hours.  Radiological Exams: No results found.  Assessment/Plan Active Problems:   Acute on chronic respiratory failure with hypoxia (HCC)   COVID-19 virus infection   Pneumonia due to COVID-19 virus   Acute metabolic encephalopathy   Chronic kidney disease, stage III (moderate)   Chronic atrial fibrillation (HCC)   1. Acute on chronic respiratory failure with hypoxia patient is currently weaned to room air satting well at this time continue to follow.  Continue supportive measures and pulmonary toilet. 2. COVID-19 virus infection in resolution phase. 3. Pneumonia due to COVID-19 patient does have some residual infiltrates secondary to the Covid we will continue to monitor and follow-up to resolution 4. Metabolic encephalopathy resolved 5. Chronic kidney disease resolved 6. Chronic atrial fibrillation rate controlled   I have personally seen and evaluated the patient, evaluated laboratory and imaging results, formulated the assessment and plan and placed orders. The Patient requires high complexity decision making with multiple systems involvement.  Rounds were done with the Respiratory Therapy Director and Staff therapists and discussed with nursing staff also.  Yevonne Pax, MD [facility] Pulmonary Critical Care Medicine Sleep Medicine

## 2020-01-18 LAB — BLOOD GAS, ARTERIAL
Acid-Base Excess: 3.8 mmol/L — ABNORMAL HIGH (ref 0.0–2.0)
Bicarbonate: 27 mmol/L (ref 20.0–28.0)
FIO2: 21
O2 Saturation: 96 %
Patient temperature: 37.8
pCO2 arterial: 37 mmHg (ref 32.0–48.0)
pH, Arterial: 7.481 — ABNORMAL HIGH (ref 7.350–7.450)
pO2, Arterial: 78.6 mmHg — ABNORMAL LOW (ref 83.0–108.0)

## 2020-01-18 LAB — COMPREHENSIVE METABOLIC PANEL WITH GFR
ALT: 210 U/L — ABNORMAL HIGH (ref 0–44)
AST: 178 U/L — ABNORMAL HIGH (ref 15–41)
Albumin: 1.8 g/dL — ABNORMAL LOW (ref 3.5–5.0)
Alkaline Phosphatase: 251 U/L — ABNORMAL HIGH (ref 38–126)
Anion gap: 13 (ref 5–15)
BUN: 58 mg/dL — ABNORMAL HIGH (ref 8–23)
CO2: 26 mmol/L (ref 22–32)
Calcium: 12.5 mg/dL — ABNORMAL HIGH (ref 8.9–10.3)
Chloride: 119 mmol/L — ABNORMAL HIGH (ref 98–111)
Creatinine, Ser: 0.67 mg/dL (ref 0.61–1.24)
GFR calc Af Amer: >60 mL/min
GFR calc non Af Amer: >60 mL/min
Glucose, Bld: 149 mg/dL — ABNORMAL HIGH (ref 70–99)
Potassium: 3.2 mmol/L — ABNORMAL LOW (ref 3.5–5.1)
Sodium: 158 mmol/L — ABNORMAL HIGH (ref 135–145)
Total Bilirubin: 0.4 mg/dL (ref 0.3–1.2)
Total Protein: 7.8 g/dL (ref 6.5–8.1)

## 2020-01-18 LAB — CBC
HCT: 29.2 % — ABNORMAL LOW (ref 39.0–52.0)
Hemoglobin: 8.7 g/dL — ABNORMAL LOW (ref 13.0–17.0)
MCH: 33 pg (ref 26.0–34.0)
MCHC: 29.8 g/dL — ABNORMAL LOW (ref 30.0–36.0)
MCV: 110.6 fL — ABNORMAL HIGH (ref 80.0–100.0)
Platelets: 352 K/uL (ref 150–400)
RBC: 2.64 MIL/uL — ABNORMAL LOW (ref 4.22–5.81)
RDW: 18.3 % — ABNORMAL HIGH (ref 11.5–15.5)
WBC: 13.2 K/uL — ABNORMAL HIGH (ref 4.0–10.5)
nRBC: 0.8 % — ABNORMAL HIGH (ref 0.0–0.2)
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
## 2020-01-19 LAB — CBC
HCT: 28.2 % — ABNORMAL LOW (ref 39.0–52.0)
Hemoglobin: 8.4 g/dL — ABNORMAL LOW (ref 13.0–17.0)
MCH: 33.3 pg (ref 26.0–34.0)
MCHC: 29.8 g/dL — ABNORMAL LOW (ref 30.0–36.0)
MCV: 111.9 fL — ABNORMAL HIGH (ref 80.0–100.0)
Platelets: 371 10*3/uL (ref 150–400)
RBC: 2.52 MIL/uL — ABNORMAL LOW (ref 4.22–5.81)
RDW: 18.4 % — ABNORMAL HIGH (ref 11.5–15.5)
WBC: 13.9 10*3/uL — ABNORMAL HIGH (ref 4.0–10.5)
nRBC: 0.9 % — ABNORMAL HIGH (ref 0.0–0.2)

## 2020-01-19 LAB — BASIC METABOLIC PANEL
Anion gap: 11 (ref 5–15)
Anion gap: 11 (ref 5–15)
BUN: 68 mg/dL — ABNORMAL HIGH (ref 8–23)
BUN: 62 mg/dL — ABNORMAL HIGH (ref 8–23)
CO2: 27 mmol/L (ref 22–32)
CO2: 25 mmol/L (ref 22–32)
Calcium: 12.6 mg/dL — ABNORMAL HIGH (ref 8.9–10.3)
Calcium: 12.3 mg/dL — ABNORMAL HIGH (ref 8.9–10.3)
Chloride: 127 mmol/L — ABNORMAL HIGH (ref 98–111)
Chloride: 123 mmol/L — ABNORMAL HIGH (ref 98–111)
Creatinine, Ser: 0.69 mg/dL (ref 0.61–1.24)
Creatinine, Ser: 0.65 mg/dL (ref 0.61–1.24)
GFR calc Af Amer: >60 mL/min (ref >60)
GFR calc Af Amer: >60 mL/min (ref >60)
GFR calc non Af Amer: >60 mL/min (ref >60)
GFR calc non Af Amer: >60 mL/min (ref >60)
Glucose, Bld: 152 mg/dL — ABNORMAL HIGH (ref 70–99)
Glucose, Bld: 166 mg/dL — ABNORMAL HIGH (ref 70–99)
Potassium: 3.3 mmol/L — ABNORMAL LOW (ref 3.5–5.1)
Potassium: 2.8 mmol/L — ABNORMAL LOW (ref 3.5–5.1)
Sodium: 165 mmol/L (ref 135–145)
Sodium: 159 mmol/L — ABNORMAL HIGH (ref 135–145)

## 2020-01-19 LAB — VANCOMYCIN, TROUGH: Vancomycin Tr: 16 ug/mL (ref 15–20)

## 2020-01-19 LAB — OSMOLALITY, URINE: Osmolality, Ur: 429 mOsm/kg (ref 300–900)

## 2020-01-19 LAB — SODIUM, URINE, RANDOM: Sodium, Ur: 29 mmol/L

## 2020-01-20 LAB — BASIC METABOLIC PANEL
Anion gap: 6 (ref 5–15)
Anion gap: 8 (ref 5–15)
BUN: 58 mg/dL — ABNORMAL HIGH (ref 8–23)
BUN: 46 mg/dL — ABNORMAL HIGH (ref 8–23)
CO2: 29 mmol/L (ref 22–32)
CO2: 29 mmol/L (ref 22–32)
Calcium: 12.3 mg/dL — ABNORMAL HIGH (ref 8.9–10.3)
Calcium: 12 mg/dL — ABNORMAL HIGH (ref 8.9–10.3)
Chloride: 121 mmol/L — ABNORMAL HIGH (ref 98–111)
Chloride: 120 mmol/L — ABNORMAL HIGH (ref 98–111)
Creatinine, Ser: 0.61 mg/dL (ref 0.61–1.24)
Creatinine, Ser: 0.64 mg/dL (ref 0.61–1.24)
GFR calc Af Amer: >60 mL/min (ref >60)
GFR calc Af Amer: >60 mL/min (ref >60)
GFR calc non Af Amer: >60 mL/min (ref >60)
GFR calc non Af Amer: >60 mL/min (ref >60)
Glucose, Bld: 140 mg/dL — ABNORMAL HIGH (ref 70–99)
Glucose, Bld: 121 mg/dL — ABNORMAL HIGH (ref 70–99)
Potassium: 3.4 mmol/L — ABNORMAL LOW (ref 3.5–5.1)
Potassium: 3.5 mmol/L (ref 3.5–5.1)
Sodium: 156 mmol/L — ABNORMAL HIGH (ref 135–145)
Sodium: 157 mmol/L — ABNORMAL HIGH (ref 135–145)

## 2020-01-20 LAB — CBC
HCT: 26.3 % — ABNORMAL LOW (ref 39.0–52.0)
Hemoglobin: 7.9 g/dL — ABNORMAL LOW (ref 13.0–17.0)
MCH: 33.9 pg (ref 26.0–34.0)
MCHC: 30 g/dL (ref 30.0–36.0)
MCV: 112.9 fL — ABNORMAL HIGH (ref 80.0–100.0)
Platelets: 336 10*3/uL (ref 150–400)
RBC: 2.33 MIL/uL — ABNORMAL LOW (ref 4.22–5.81)
RDW: 18.3 % — ABNORMAL HIGH (ref 11.5–15.5)
WBC: 13.1 10*3/uL — ABNORMAL HIGH (ref 4.0–10.5)
nRBC: 1 % — ABNORMAL HIGH (ref 0.0–0.2)

## 2020-01-21 ENCOUNTER — Other Ambulatory Visit (HOSPITAL_COMMUNITY): Payer: Medicare Other

## 2020-01-21 DIAGNOSIS — I482 Chronic atrial fibrillation, unspecified: Secondary | ICD-10-CM | POA: Diagnosis not present

## 2020-01-21 DIAGNOSIS — N183 Chronic kidney disease, stage 3 unspecified: Secondary | ICD-10-CM | POA: Diagnosis not present

## 2020-01-21 DIAGNOSIS — G9341 Metabolic encephalopathy: Secondary | ICD-10-CM | POA: Diagnosis not present

## 2020-01-21 DIAGNOSIS — J9621 Acute and chronic respiratory failure with hypoxia: Secondary | ICD-10-CM | POA: Diagnosis not present

## 2020-01-21 LAB — CBC
HCT: 25 % — ABNORMAL LOW (ref 39.0–52.0)
Hemoglobin: 7.3 g/dL — ABNORMAL LOW (ref 13.0–17.0)
MCH: 33.8 pg (ref 26.0–34.0)
MCHC: 29.2 g/dL — ABNORMAL LOW (ref 30.0–36.0)
MCV: 115.7 fL — ABNORMAL HIGH (ref 80.0–100.0)
Platelets: 329 10*3/uL (ref 150–400)
RBC: 2.16 MIL/uL — ABNORMAL LOW (ref 4.22–5.81)
RDW: 17.7 % — ABNORMAL HIGH (ref 11.5–15.5)
WBC: 15.1 10*3/uL — ABNORMAL HIGH (ref 4.0–10.5)
nRBC: 0.7 % — ABNORMAL HIGH (ref 0.0–0.2)

## 2020-01-21 LAB — CULTURE, RESPIRATORY W GRAM STAIN: Culture: NORMAL

## 2020-01-21 LAB — BASIC METABOLIC PANEL
Anion gap: 8 (ref 5–15)
BUN: 39 mg/dL — ABNORMAL HIGH (ref 8–23)
CO2: 29 mmol/L (ref 22–32)
Calcium: 11.9 mg/dL — ABNORMAL HIGH (ref 8.9–10.3)
Chloride: 115 mmol/L — ABNORMAL HIGH (ref 98–111)
Creatinine, Ser: 0.56 mg/dL — ABNORMAL LOW (ref 0.61–1.24)
GFR calc Af Amer: >60 mL/min (ref >60)
GFR calc non Af Amer: >60 mL/min (ref >60)
Glucose, Bld: 136 mg/dL — ABNORMAL HIGH (ref 70–99)
Potassium: 3.4 mmol/L — ABNORMAL LOW (ref 3.5–5.1)
Sodium: 152 mmol/L — ABNORMAL HIGH (ref 135–145)
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
## 2020-01-22 DIAGNOSIS — I482 Chronic atrial fibrillation, unspecified: Secondary | ICD-10-CM | POA: Diagnosis not present

## 2020-01-22 DIAGNOSIS — N183 Chronic kidney disease, stage 3 unspecified: Secondary | ICD-10-CM | POA: Diagnosis not present

## 2020-01-22 DIAGNOSIS — J9621 Acute and chronic respiratory failure with hypoxia: Secondary | ICD-10-CM | POA: Diagnosis not present

## 2020-01-22 DIAGNOSIS — G9341 Metabolic encephalopathy: Secondary | ICD-10-CM | POA: Diagnosis not present

## 2020-01-22 LAB — BLOOD GAS, ARTERIAL
Acid-Base Excess: 7 mmol/L — ABNORMAL HIGH (ref 0.0–2.0)
Bicarbonate: 30.7 mmol/L — ABNORMAL HIGH (ref 20.0–28.0)
FIO2: 24
O2 Saturation: 94.6 %
Patient temperature: 37
pCO2 arterial: 41.7 mmHg (ref 32.0–48.0)
pH, Arterial: 7.481 — ABNORMAL HIGH (ref 7.350–7.450)
pO2, Arterial: 66.5 mmHg — ABNORMAL LOW (ref 83.0–108.0)
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
## 2020-01-23 DIAGNOSIS — I482 Chronic atrial fibrillation, unspecified: Secondary | ICD-10-CM | POA: Diagnosis not present

## 2020-01-23 DIAGNOSIS — J9621 Acute and chronic respiratory failure with hypoxia: Secondary | ICD-10-CM | POA: Diagnosis not present

## 2020-01-23 DIAGNOSIS — G9341 Metabolic encephalopathy: Secondary | ICD-10-CM | POA: Diagnosis not present

## 2020-01-23 DIAGNOSIS — N183 Chronic kidney disease, stage 3 unspecified: Secondary | ICD-10-CM | POA: Diagnosis not present

## 2020-01-23 LAB — BASIC METABOLIC PANEL
Anion gap: 9 (ref 5–15)
BUN: 30 mg/dL — ABNORMAL HIGH (ref 8–23)
CO2: 28 mmol/L (ref 22–32)
Calcium: 11.4 mg/dL — ABNORMAL HIGH (ref 8.9–10.3)
Chloride: 108 mmol/L (ref 98–111)
Creatinine, Ser: 0.49 mg/dL — ABNORMAL LOW (ref 0.61–1.24)
GFR calc Af Amer: >60 mL/min (ref >60)
GFR calc non Af Amer: >60 mL/min (ref >60)
Glucose, Bld: 122 mg/dL — ABNORMAL HIGH (ref 70–99)
Potassium: 3.4 mmol/L — ABNORMAL LOW (ref 3.5–5.1)
Sodium: 145 mmol/L (ref 135–145)

## 2020-01-23 NOTE — Progress Notes (Addendum)
Pulmonary Critical Care Medicine [facility] [place]   PULMONARY CRITICAL CARE SERVICE  PROGRESS NOTE  Date of Service: 01/23/2020  Kyle [name]  MHD:[id number]  DOB: 1946/09/28   DOA: 12/12/2019  Referring Physician: Carron Curie, MD  HPI: Kyle [name] is a 74 y.o. male seen for follow up of Acute on Chronic Respiratory Failure.  This morning patient is on room air reportedly no suctioning by respiratory therapy patient is going to reassess him.  As noted previously he has been requiring frequent NT suctioning and we were considering the possibility of a another tracheostomy  Medications: Reviewed on Rounds  Physical Exam:  Vitals: Temperature is 99.4 pulse 64 respiratory 28 blood pressure is 136/65 saturations 98%  Ventilator Settings on room air  . General: Comfortable at this time . Eyes: Grossly normal lids, irises & conjunctiva . ENT: grossly tongue is normal . Neck: no obvious mass . Cardiovascular: S1 S2 normal no gallop . Respiratory: No rhonchi coarse breath sounds are noted . Abdomen: soft . Skin: no rash seen on limited exam . Musculoskeletal: not rigid . Psychiatric:unable to assess . Neurologic: no seizure no involuntary movements         Lab Data:   Basic Metabolic Panel: Recent Labs  Lab 01/19/20 1752 01/20/20 0708 01/20/20 1754 01/21/20 0409 01/23/20 0612  NA 159* 156* 157* 152* 145  K 2.8* 3.4* 3.5 3.4* 3.4*  CL 123* 121* 120* 115* 108  CO2 25 29 29 29 28   GLUCOSE 166* 140* 121* 136* 122*  BUN 62* 58* 46* 39* 30*  CREATININE 0.65 0.61 0.64 0.56* 0.49*  CALCIUM 12.3* 12.3* 12.0* 11.9* 11.4*    ABG: Recent Labs  Lab 01/18/20 0950 01/22/20 1042  PHART 7.481* 7.481*  PCO2ART 37.0 41.7  PO2ART 78.6* 66.5*  HCO3 27.0 30.7*  O2SAT 96.0 94.6    Liver Function Tests: Recent Labs  Lab 01/18/20 0622  AST 178*  ALT 210*  ALKPHOS 251*  BILITOT 0.4  PROT 7.8  ALBUMIN 1.8*   No results for input(s): LIPASE, AMYLASE in the  last 168 hours. No results for input(s): AMMONIA in the last 168 hours.  CBC: Recent Labs  Lab 01/18/20 0622 01/19/20 0808 01/20/20 0708 01/21/20 0409  WBC 13.2* 13.9* 13.1* 15.1*  HGB 8.7* 8.4* 7.9* 7.3*  HCT 29.2* 28.2* 26.3* 25.0*  MCV 110.6* 111.9* 112.9* 115.7*  PLT 352 371 336 329    Cardiac Enzymes: No results for input(s): CKTOTAL, CKMB, CKMBINDEX, TROPONINI in the last 168 hours.  BNP (last 3 results) No results for input(s): BNP in the last 8760 hours.  ProBNP (last 3 results) No results for input(s): PROBNP in the last 8760 hours.  Radiological Exams: No results found.  Assessment/Plan Active Problems:   Acute on chronic respiratory failure with hypoxia (HCC)   COVID-19 virus infection   Pneumonia due to COVID-19 virus   Acute metabolic encephalopathy   Chronic kidney disease, stage III (moderate)   Chronic atrial fibrillation (HCC)   1. Acute on chronic respiratory failure with hypoxia patient some secretions may be improving we will need to keep a close eye however because of increased secretions and requiring nasotracheal suctioning patient may need to have another tracheostomy work on evaluate daily 2. COVID-19 virus infection treated we will continue with supportive care 3. Pneumonia due to COVID-19 in resolution phase now 4. Metabolic encephalopathy no change 5. Chronic kidney disease stage III following labs 6. Chronic atrial fibrillation rate controlled   I have personally seen  and evaluated the patient, evaluated laboratory and imaging results, formulated the assessment and plan and placed orders. The Patient requires high complexity decision making with multiple systems involvement.  Rounds were done with the Respiratory Therapy Director and Staff therapists and discussed with nursing staff also.  Allyne Gee, MD [facility] Pulmonary Critical Care Medicine Sleep Medicine

## 2020-01-24 LAB — CULTURE, BLOOD (ROUTINE X 2)
Culture: NO GROWTH
Culture: NO GROWTH
Special Requests: ADEQUATE
Special Requests: ADEQUATE

## 2020-01-25 LAB — NOVEL CORONAVIRUS, NAA (HOSP ORDER, SEND-OUT TO REF LAB; TAT 18-24 HRS): SARS-CoV-2, NAA: NOT DETECTED

## 2020-01-25 LAB — POTASSIUM: Potassium: 2.8 mmol/L — ABNORMAL LOW (ref 3.5–5.1)

## 2020-01-27 ENCOUNTER — Inpatient Hospital Stay
Admission: EM | Admit: 2020-01-27 | Discharge: 2020-02-15 | DRG: 853 | Disposition: A | Discharge status: Skilled Nursing Facility | Payer: Medicare Other | Attending: Internal Medicine | Admitting: Internal Medicine

## 2020-01-27 ENCOUNTER — Other Ambulatory Visit: Payer: Self-pay

## 2020-01-27 ENCOUNTER — Emergency Department: Payer: Medicare Other

## 2020-01-27 DIAGNOSIS — E119 Type 2 diabetes mellitus without complications: Secondary | ICD-10-CM

## 2020-01-27 DIAGNOSIS — J189 Pneumonia, unspecified organism: Secondary | ICD-10-CM | POA: Diagnosis not present

## 2020-01-27 DIAGNOSIS — Z8673 Personal history of transient ischemic attack (TIA), and cerebral infarction without residual deficits: Secondary | ICD-10-CM

## 2020-01-27 DIAGNOSIS — R6520 Severe sepsis without septic shock: Secondary | ICD-10-CM

## 2020-01-27 DIAGNOSIS — F0390 Unspecified dementia without behavioral disturbance: Secondary | ICD-10-CM | POA: Diagnosis present

## 2020-01-27 DIAGNOSIS — I482 Chronic atrial fibrillation, unspecified: Secondary | ICD-10-CM

## 2020-01-27 DIAGNOSIS — E43 Unspecified severe protein-calorie malnutrition: Secondary | ICD-10-CM | POA: Diagnosis present

## 2020-01-27 DIAGNOSIS — Z6828 Body mass index (BMI) 28.0-28.9, adult: Secondary | ICD-10-CM

## 2020-01-27 DIAGNOSIS — A419 Sepsis, unspecified organism: Secondary | ICD-10-CM | POA: Diagnosis not present

## 2020-01-27 DIAGNOSIS — B952 Enterococcus as the cause of diseases classified elsewhere: Secondary | ICD-10-CM | POA: Diagnosis present

## 2020-01-27 DIAGNOSIS — Z931 Gastrostomy status: Secondary | ICD-10-CM

## 2020-01-27 DIAGNOSIS — E876 Hypokalemia: Secondary | ICD-10-CM | POA: Diagnosis present

## 2020-01-27 DIAGNOSIS — L89224 Pressure ulcer of left hip, stage 4: Secondary | ICD-10-CM | POA: Diagnosis present

## 2020-01-27 DIAGNOSIS — Z7982 Long term (current) use of aspirin: Secondary | ICD-10-CM

## 2020-01-27 DIAGNOSIS — E870 Hyperosmolality and hypernatremia: Secondary | ICD-10-CM | POA: Diagnosis present

## 2020-01-27 DIAGNOSIS — I679 Cerebrovascular disease, unspecified: Secondary | ICD-10-CM | POA: Diagnosis present

## 2020-01-27 DIAGNOSIS — F319 Bipolar disorder, unspecified: Secondary | ICD-10-CM | POA: Diagnosis present

## 2020-01-27 DIAGNOSIS — R1310 Dysphagia, unspecified: Secondary | ICD-10-CM

## 2020-01-27 DIAGNOSIS — Z818 Family history of other mental and behavioral disorders: Secondary | ICD-10-CM

## 2020-01-27 DIAGNOSIS — N183 Chronic kidney disease, stage 3 unspecified: Secondary | ICD-10-CM | POA: Diagnosis present

## 2020-01-27 DIAGNOSIS — T17208A Unspecified foreign body in pharynx causing other injury, initial encounter: Secondary | ICD-10-CM | POA: Diagnosis present

## 2020-01-27 DIAGNOSIS — E1122 Type 2 diabetes mellitus with diabetic chronic kidney disease: Secondary | ICD-10-CM

## 2020-01-27 DIAGNOSIS — I10 Essential (primary) hypertension: Secondary | ICD-10-CM | POA: Diagnosis not present

## 2020-01-27 DIAGNOSIS — D649 Anemia, unspecified: Secondary | ICD-10-CM | POA: Diagnosis present

## 2020-01-27 DIAGNOSIS — F0280 Dementia in other diseases classified elsewhere without behavioral disturbance: Secondary | ICD-10-CM | POA: Diagnosis present

## 2020-01-27 DIAGNOSIS — J690 Pneumonitis due to inhalation of food and vomit: Secondary | ICD-10-CM | POA: Diagnosis present

## 2020-01-27 DIAGNOSIS — J440 Chronic obstructive pulmonary disease with acute lower respiratory infection: Secondary | ICD-10-CM | POA: Diagnosis present

## 2020-01-27 DIAGNOSIS — G9340 Encephalopathy, unspecified: Secondary | ICD-10-CM

## 2020-01-27 DIAGNOSIS — Z1621 Resistance to vancomycin: Secondary | ICD-10-CM | POA: Diagnosis present

## 2020-01-27 DIAGNOSIS — N400 Enlarged prostate without lower urinary tract symptoms: Secondary | ICD-10-CM | POA: Diagnosis present

## 2020-01-27 DIAGNOSIS — Z8616 Personal history of COVID-19: Secondary | ICD-10-CM

## 2020-01-27 DIAGNOSIS — G9341 Metabolic encephalopathy: Secondary | ICD-10-CM | POA: Diagnosis present

## 2020-01-27 DIAGNOSIS — G20 Parkinson's disease: Secondary | ICD-10-CM | POA: Diagnosis present

## 2020-01-27 DIAGNOSIS — Z7401 Bed confinement status: Secondary | ICD-10-CM

## 2020-01-27 DIAGNOSIS — E1140 Type 2 diabetes mellitus with diabetic neuropathy, unspecified: Secondary | ICD-10-CM | POA: Diagnosis present

## 2020-01-27 DIAGNOSIS — L89154 Pressure ulcer of sacral region, stage 4: Secondary | ICD-10-CM | POA: Diagnosis present

## 2020-01-27 DIAGNOSIS — R0682 Tachypnea, not elsewhere classified: Secondary | ICD-10-CM | POA: Diagnosis present

## 2020-01-27 DIAGNOSIS — G931 Anoxic brain damage, not elsewhere classified: Secondary | ICD-10-CM | POA: Diagnosis not present

## 2020-01-27 DIAGNOSIS — Z79899 Other long term (current) drug therapy: Secondary | ICD-10-CM

## 2020-01-27 DIAGNOSIS — Z66 Do not resuscitate: Secondary | ICD-10-CM | POA: Diagnosis present

## 2020-01-27 DIAGNOSIS — E785 Hyperlipidemia, unspecified: Secondary | ICD-10-CM | POA: Diagnosis present

## 2020-01-27 DIAGNOSIS — R6510 Systemic inflammatory response syndrome (SIRS) of non-infectious origin without acute organ dysfunction: Secondary | ICD-10-CM | POA: Diagnosis present

## 2020-01-27 DIAGNOSIS — I129 Hypertensive chronic kidney disease with stage 1 through stage 4 chronic kidney disease, or unspecified chronic kidney disease: Secondary | ICD-10-CM | POA: Diagnosis present

## 2020-01-27 DIAGNOSIS — Z515 Encounter for palliative care: Secondary | ICD-10-CM

## 2020-01-27 DIAGNOSIS — D631 Anemia in chronic kidney disease: Secondary | ICD-10-CM | POA: Diagnosis present

## 2020-01-27 DIAGNOSIS — R509 Fever, unspecified: Secondary | ICD-10-CM

## 2020-01-27 DIAGNOSIS — Z7901 Long term (current) use of anticoagulants: Secondary | ICD-10-CM

## 2020-01-27 DIAGNOSIS — L89311 Pressure ulcer of right buttock, stage 1: Secondary | ICD-10-CM | POA: Diagnosis present

## 2020-01-27 DIAGNOSIS — M4628 Osteomyelitis of vertebra, sacral and sacrococcygeal region: Secondary | ICD-10-CM

## 2020-01-27 DIAGNOSIS — Z86711 Personal history of pulmonary embolism: Secondary | ICD-10-CM

## 2020-01-27 DIAGNOSIS — Z7189 Other specified counseling: Secondary | ICD-10-CM

## 2020-01-27 DIAGNOSIS — E669 Obesity, unspecified: Secondary | ICD-10-CM | POA: Diagnosis present

## 2020-01-27 LAB — COMPREHENSIVE METABOLIC PANEL
ALT: 64 U/L — ABNORMAL HIGH (ref 0–44)
AST: 51 U/L — ABNORMAL HIGH (ref 15–41)
Albumin: 2.3 g/dL — ABNORMAL LOW (ref 3.5–5.0)
Alkaline Phosphatase: 168 U/L — ABNORMAL HIGH (ref 38–126)
Anion gap: 9 (ref 5–15)
BUN: 45 mg/dL — ABNORMAL HIGH (ref 8–23)
CO2: 27 mmol/L (ref 22–32)
Calcium: 12.2 mg/dL — ABNORMAL HIGH (ref 8.9–10.3)
Chloride: 107 mmol/L (ref 98–111)
Creatinine, Ser: 0.51 mg/dL — ABNORMAL LOW (ref 0.61–1.24)
GFR calc Af Amer: >60 mL/min (ref >60)
GFR calc non Af Amer: >60 mL/min (ref >60)
Glucose, Bld: 113 mg/dL — ABNORMAL HIGH (ref 70–99)
Potassium: 3.7 mmol/L (ref 3.5–5.1)
Sodium: 143 mmol/L (ref 135–145)
Total Bilirubin: 0.5 mg/dL (ref 0.3–1.2)
Total Protein: 7.9 g/dL (ref 6.5–8.1)

## 2020-01-27 LAB — LACTIC ACID, PLASMA: Lactic Acid, Venous: 1.7 mmol/L (ref 0.5–1.9)

## 2020-01-27 LAB — URINALYSIS, ROUTINE W REFLEX MICROSCOPIC
Bilirubin Urine: NEGATIVE
Glucose, UA: NEGATIVE mg/dL
Hgb urine dipstick: NEGATIVE
Ketones, ur: NEGATIVE mg/dL
Leukocytes,Ua: NEGATIVE
Nitrite: NEGATIVE
Protein, ur: NEGATIVE mg/dL
Specific Gravity, Urine: 1.012 (ref 1.005–1.030)
pH: 7 (ref 5.0–8.0)

## 2020-01-27 LAB — CBC WITH DIFFERENTIAL/PLATELET
Abs Immature Granulocytes: 0.06 10*3/uL (ref 0.00–0.07)
Basophils Absolute: 0.1 10*3/uL (ref 0.0–0.1)
Basophils Relative: 1 %
Eosinophils Absolute: 0.4 10*3/uL (ref 0.0–0.5)
Eosinophils Relative: 4 %
HCT: 29.5 % — ABNORMAL LOW (ref 39.0–52.0)
Hemoglobin: 8.4 g/dL — ABNORMAL LOW (ref 13.0–17.0)
Immature Granulocytes: 1 %
Lymphocytes Relative: 39 %
Lymphs Abs: 4.4 10*3/uL — ABNORMAL HIGH (ref 0.7–4.0)
MCH: 32.9 pg (ref 26.0–34.0)
MCHC: 28.5 g/dL — ABNORMAL LOW (ref 30.0–36.0)
MCV: 115.7 fL — ABNORMAL HIGH (ref 80.0–100.0)
Monocytes Absolute: 0.9 10*3/uL (ref 0.1–1.0)
Monocytes Relative: 8 %
Neutro Abs: 5.5 10*3/uL (ref 1.7–7.7)
Neutrophils Relative %: 47 %
Platelets: 446 10*3/uL — ABNORMAL HIGH (ref 150–400)
RBC: 2.55 MIL/uL — ABNORMAL LOW (ref 4.22–5.81)
RDW: 17.4 % — ABNORMAL HIGH (ref 11.5–15.5)
WBC: 11.3 10*3/uL — ABNORMAL HIGH (ref 4.0–10.5)
nRBC: 0.4 % — ABNORMAL HIGH (ref 0.0–0.2)

## 2020-01-27 MED ORDER — SODIUM CHLORIDE 0.9 % IV BOLUS
1000.0000 mL | Freq: Once | INTRAVENOUS | Status: AC
  Administered 2020-01-27: 1000 mL via INTRAVENOUS

## 2020-01-27 MED ORDER — VANCOMYCIN HCL IN DEXTROSE 1-5 GM/200ML-% IV SOLN
1000.0000 mg | Freq: Once | INTRAVENOUS | Status: AC
  Administered 2020-01-27: 1000 mg via INTRAVENOUS
  Filled 2020-01-27: qty 200

## 2020-01-27 MED ORDER — SODIUM CHLORIDE 0.9 % IV SOLN
2.0000 g | Freq: Once | INTRAVENOUS | Status: AC
  Administered 2020-01-27: 2 g via INTRAVENOUS
  Filled 2020-01-27: qty 2

## 2020-01-27 NOTE — ED Notes (Signed)
Lab with pt 
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
## 2020-01-27 NOTE — ED Triage Notes (Signed)
Per ems pt was discharged from [facility] yesterday with covid, fever, and pneumonia and has continued with fever today. 650 mg tylenol at 20000 at facility. BGL 138 and temp 101 per ems.  Pt alert, non-verbal at baseline. NAD noted. Pt hot to touch. No respiratory distress or cough noted.

## 2020-01-27 NOTE — ED Notes (Signed)
Attempt venipuncture by this RN and Mac, rn without success. Pt has a right upper arm picc line that does not have blood return but flushes easily. Lab notified of need for sepsis venipuncture assist.

## 2020-01-27 NOTE — ED Notes (Signed)
Perineal skin breakdown related to incontinence noted.
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
## 2020-01-28 ENCOUNTER — Encounter
Admission: EM | Disposition: A | Discharge status: Skilled Nursing Facility | Payer: Self-pay | Source: Home / Self Care | Attending: Internal Medicine

## 2020-01-28 ENCOUNTER — Inpatient Hospital Stay: Payer: Medicare Other

## 2020-01-28 ENCOUNTER — Inpatient Hospital Stay: Payer: Medicare Other | Admitting: Certified Registered"

## 2020-01-28 ENCOUNTER — Encounter: Payer: Self-pay | Admitting: Internal Medicine

## 2020-01-28 ENCOUNTER — Inpatient Hospital Stay: Admit: 2020-01-28 | Payer: Medicare Other | Admitting: Surgery

## 2020-01-28 DIAGNOSIS — L89154 Pressure ulcer of sacral region, stage 4: Secondary | ICD-10-CM

## 2020-01-28 DIAGNOSIS — L89150 Pressure ulcer of sacral region, unstageable: Secondary | ICD-10-CM | POA: Diagnosis not present

## 2020-01-28 DIAGNOSIS — Z515 Encounter for palliative care: Secondary | ICD-10-CM | POA: Diagnosis present

## 2020-01-28 DIAGNOSIS — I482 Chronic atrial fibrillation, unspecified: Secondary | ICD-10-CM | POA: Diagnosis present

## 2020-01-28 DIAGNOSIS — E870 Hyperosmolality and hypernatremia: Secondary | ICD-10-CM | POA: Diagnosis present

## 2020-01-28 DIAGNOSIS — Z931 Gastrostomy status: Secondary | ICD-10-CM | POA: Diagnosis not present

## 2020-01-28 DIAGNOSIS — N183 Chronic kidney disease, stage 3 unspecified: Secondary | ICD-10-CM | POA: Diagnosis present

## 2020-01-28 DIAGNOSIS — I10 Essential (primary) hypertension: Secondary | ICD-10-CM | POA: Diagnosis not present

## 2020-01-28 DIAGNOSIS — B952 Enterococcus as the cause of diseases classified elsewhere: Secondary | ICD-10-CM | POA: Diagnosis present

## 2020-01-28 DIAGNOSIS — I129 Hypertensive chronic kidney disease with stage 1 through stage 4 chronic kidney disease, or unspecified chronic kidney disease: Secondary | ICD-10-CM | POA: Diagnosis present

## 2020-01-28 DIAGNOSIS — J189 Pneumonia, unspecified organism: Secondary | ICD-10-CM | POA: Diagnosis not present

## 2020-01-28 DIAGNOSIS — E1140 Type 2 diabetes mellitus with diabetic neuropathy, unspecified: Secondary | ICD-10-CM | POA: Diagnosis present

## 2020-01-28 DIAGNOSIS — L8944 Pressure ulcer of contiguous site of back, buttock and hip, stage 4: Secondary | ICD-10-CM | POA: Diagnosis not present

## 2020-01-28 DIAGNOSIS — L89329 Pressure ulcer of left buttock, unspecified stage: Secondary | ICD-10-CM | POA: Diagnosis not present

## 2020-01-28 DIAGNOSIS — G20 Parkinson's disease: Secondary | ICD-10-CM

## 2020-01-28 DIAGNOSIS — G9341 Metabolic encephalopathy: Secondary | ICD-10-CM | POA: Diagnosis present

## 2020-01-28 DIAGNOSIS — J440 Chronic obstructive pulmonary disease with acute lower respiratory infection: Secondary | ICD-10-CM | POA: Diagnosis present

## 2020-01-28 DIAGNOSIS — L89311 Pressure ulcer of right buttock, stage 1: Secondary | ICD-10-CM

## 2020-01-28 DIAGNOSIS — R6520 Severe sepsis without septic shock: Secondary | ICD-10-CM | POA: Diagnosis not present

## 2020-01-28 DIAGNOSIS — F0281 Dementia in other diseases classified elsewhere with behavioral disturbance: Secondary | ICD-10-CM

## 2020-01-28 DIAGNOSIS — G931 Anoxic brain damage, not elsewhere classified: Secondary | ICD-10-CM | POA: Diagnosis not present

## 2020-01-28 DIAGNOSIS — R1310 Dysphagia, unspecified: Secondary | ICD-10-CM | POA: Diagnosis present

## 2020-01-28 DIAGNOSIS — A419 Sepsis, unspecified organism: Secondary | ICD-10-CM | POA: Diagnosis present

## 2020-01-28 DIAGNOSIS — J690 Pneumonitis due to inhalation of food and vomit: Secondary | ICD-10-CM | POA: Diagnosis present

## 2020-01-28 DIAGNOSIS — F319 Bipolar disorder, unspecified: Secondary | ICD-10-CM | POA: Diagnosis present

## 2020-01-28 DIAGNOSIS — Z1621 Resistance to vancomycin: Secondary | ICD-10-CM | POA: Diagnosis present

## 2020-01-28 DIAGNOSIS — Z66 Do not resuscitate: Secondary | ICD-10-CM | POA: Diagnosis present

## 2020-01-28 DIAGNOSIS — E876 Hypokalemia: Secondary | ICD-10-CM | POA: Diagnosis present

## 2020-01-28 DIAGNOSIS — T17208A Unspecified foreign body in pharynx causing other injury, initial encounter: Secondary | ICD-10-CM | POA: Diagnosis present

## 2020-01-28 DIAGNOSIS — N400 Enlarged prostate without lower urinary tract symptoms: Secondary | ICD-10-CM | POA: Diagnosis present

## 2020-01-28 DIAGNOSIS — E119 Type 2 diabetes mellitus without complications: Secondary | ICD-10-CM | POA: Diagnosis not present

## 2020-01-28 DIAGNOSIS — D638 Anemia in other chronic diseases classified elsewhere: Secondary | ICD-10-CM | POA: Diagnosis not present

## 2020-01-28 DIAGNOSIS — E43 Unspecified severe protein-calorie malnutrition: Secondary | ICD-10-CM | POA: Diagnosis present

## 2020-01-28 DIAGNOSIS — Z7189 Other specified counseling: Secondary | ICD-10-CM | POA: Diagnosis not present

## 2020-01-28 DIAGNOSIS — R6510 Systemic inflammatory response syndrome (SIRS) of non-infectious origin without acute organ dysfunction: Secondary | ICD-10-CM | POA: Diagnosis present

## 2020-01-28 DIAGNOSIS — Z8616 Personal history of COVID-19: Secondary | ICD-10-CM | POA: Diagnosis not present

## 2020-01-28 HISTORY — PX: INCISION AND DRAINAGE OF WOUND: SHX1803

## 2020-01-28 HISTORY — PX: IRRIGATION AND DEBRIDEMENT BUTTOCKS: SHX6601

## 2020-01-28 HISTORY — PX: APPLICATION OF WOUND VAC: SHX5189

## 2020-01-28 LAB — RESPIRATORY PANEL BY RT PCR (FLU A&B, COVID)
Influenza A by PCR: NEGATIVE
Influenza B by PCR: NEGATIVE
SARS Coronavirus 2 by RT PCR: NEGATIVE

## 2020-01-28 LAB — COMPREHENSIVE METABOLIC PANEL
ALT: 65 U/L — ABNORMAL HIGH (ref 0–44)
AST: 38 U/L (ref 15–41)
Albumin: 2 g/dL — ABNORMAL LOW (ref 3.5–5.0)
Alkaline Phosphatase: 145 U/L — ABNORMAL HIGH (ref 38–126)
Anion gap: 11 (ref 5–15)
BUN: 40 mg/dL — ABNORMAL HIGH (ref 8–23)
CO2: 26 mmol/L (ref 22–32)
Calcium: 11.6 mg/dL — ABNORMAL HIGH (ref 8.9–10.3)
Chloride: 108 mmol/L (ref 98–111)
Creatinine, Ser: 0.43 mg/dL — ABNORMAL LOW (ref 0.61–1.24)
GFR calc Af Amer: >60 mL/min (ref >60)
GFR calc non Af Amer: >60 mL/min (ref >60)
Glucose, Bld: 108 mg/dL — ABNORMAL HIGH (ref 70–99)
Potassium: 3.3 mmol/L — ABNORMAL LOW (ref 3.5–5.1)
Sodium: 145 mmol/L (ref 135–145)
Total Bilirubin: 0.5 mg/dL (ref 0.3–1.2)
Total Protein: 7 g/dL (ref 6.5–8.1)

## 2020-01-28 LAB — CBC
HCT: 25 % — ABNORMAL LOW (ref 39.0–52.0)
Hemoglobin: 7.4 g/dL — ABNORMAL LOW (ref 13.0–17.0)
MCH: 33 pg (ref 26.0–34.0)
MCHC: 29.6 g/dL — ABNORMAL LOW (ref 30.0–36.0)
MCV: 111.6 fL — ABNORMAL HIGH (ref 80.0–100.0)
Platelets: 393 10*3/uL (ref 150–400)
RBC: 2.24 MIL/uL — ABNORMAL LOW (ref 4.22–5.81)
RDW: 17.2 % — ABNORMAL HIGH (ref 11.5–15.5)
WBC: 9 10*3/uL (ref 4.0–10.5)
nRBC: 0 % (ref 0.0–0.2)

## 2020-01-28 LAB — MAGNESIUM: Magnesium: 2.3 mg/dL (ref 1.7–2.4)

## 2020-01-28 LAB — SAMPLE TO BLOOD BANK

## 2020-01-28 LAB — GLUCOSE, CAPILLARY
Glucose-Capillary: 71 mg/dL (ref 70–99)
Glucose-Capillary: 72 mg/dL (ref 70–99)
Glucose-Capillary: 77 mg/dL (ref 70–99)
Glucose-Capillary: 90 mg/dL (ref 70–99)
Glucose-Capillary: 94 mg/dL (ref 70–99)
Glucose-Capillary: 99 mg/dL (ref 70–99)

## 2020-01-28 LAB — PROCALCITONIN: Procalcitonin: 0.28 ng/mL

## 2020-01-28 LAB — PHOSPHORUS: Phosphorus: 3.3 mg/dL (ref 2.5–4.6)

## 2020-01-28 LAB — STREP PNEUMONIAE URINARY ANTIGEN: Strep Pneumo Urinary Antigen: NEGATIVE

## 2020-01-28 LAB — HEMOGLOBIN A1C
Hgb A1c MFr Bld: 4.5 % — ABNORMAL LOW (ref 4.8–5.6)
Mean Plasma Glucose: 82.45 mg/dL

## 2020-01-28 LAB — TSH: TSH: 3.28 u[IU]/mL (ref 0.350–4.500)

## 2020-01-28 LAB — SEDIMENTATION RATE: Sed Rate: 126 mm/hr — ABNORMAL HIGH (ref 0–20)

## 2020-01-28 LAB — C-REACTIVE PROTEIN: CRP: 9.7 mg/dL — ABNORMAL HIGH (ref <1.0)

## 2020-01-28 SURGERY — IRRIGATION AND DEBRIDEMENT BUTTOCKS
Anesthesia: General | Site: Hip

## 2020-01-28 MED ORDER — ALBUTEROL SULFATE HFA 108 (90 BASE) MCG/ACT IN AERS: 2.0000 | INHALATION_SPRAY | Freq: Four times a day (QID) | RESPIRATORY_TRACT | Status: DC

## 2020-01-28 MED ORDER — PROPOFOL 10 MG/ML IV BOLUS
INTRAVENOUS | Status: AC
  Filled 2020-01-28: qty 20

## 2020-01-28 MED ORDER — ACETAMINOPHEN 325 MG PO TABS
650.0000 mg | ORAL_TABLET | Freq: Four times a day (QID) | ORAL | Status: DC | PRN
  Administered 2020-01-28: 650 mg via ORAL
  Filled 2020-01-28: qty 2

## 2020-01-28 MED ORDER — PROPOFOL 500 MG/50ML IV EMUL
INTRAVENOUS | Status: DC | PRN
  Administered 2020-01-28: 75 ug/kg/min via INTRAVENOUS

## 2020-01-28 MED ORDER — FENTANYL CITRATE (PF) 100 MCG/2ML IJ SOLN
INTRAMUSCULAR | Status: DC | PRN
  Administered 2020-01-28 (×2): 25 ug via INTRAVENOUS

## 2020-01-28 MED ORDER — CARBIDOPA-LEVODOPA 25-100 MG PO TABS
1.0000 | ORAL_TABLET | Freq: Two times a day (BID) | ORAL | Status: DC
  Administered 2020-01-28 – 2020-02-15 (×36): 1
  Filled 2020-01-28 (×39): qty 1

## 2020-01-28 MED ORDER — FENTANYL CITRATE (PF) 100 MCG/2ML IJ SOLN
INTRAMUSCULAR | Status: AC
  Filled 2020-01-28: qty 2

## 2020-01-28 MED ORDER — HYDROCODONE-ACETAMINOPHEN 5-325 MG PO TABS
1.0000 | ORAL_TABLET | ORAL | Status: DC | PRN
  Administered 2020-01-28: 1 via ORAL
  Filled 2020-01-28: qty 1

## 2020-01-28 MED ORDER — FAMOTIDINE 20 MG PO TABS
20.0000 mg | ORAL_TABLET | Freq: Every day | ORAL | Status: DC
  Administered 2020-01-28 – 2020-02-15 (×18): 20 mg
  Filled 2020-01-28 (×19): qty 1

## 2020-01-28 MED ORDER — IBUPROFEN 100 MG/5ML PO SUSP
600.0000 mg | Freq: Three times a day (TID) | ORAL | Status: DC | PRN
  Filled 2020-01-28 (×2): qty 30

## 2020-01-28 MED ORDER — ACETAMINOPHEN 160 MG/5ML PO SOLN: 325.0000 mg | ORAL | Status: DC | PRN

## 2020-01-28 MED ORDER — DEXTROSE-NACL 5-0.2 % IV SOLN
250.0000 mL | Freq: Once | INTRAVENOUS | Status: AC
  Administered 2020-01-28: 250 mL via INTRAVENOUS

## 2020-01-28 MED ORDER — LACTATED RINGERS IV SOLN: INTRAVENOUS | Status: DC | PRN

## 2020-01-28 MED ORDER — DEXMEDETOMIDINE HCL 200 MCG/2ML IV SOLN
INTRAVENOUS | Status: DC | PRN
  Administered 2020-01-28: 12 ug via INTRAVENOUS

## 2020-01-28 MED ORDER — SODIUM CHLORIDE 0.9 % IV SOLN: INTRAVENOUS | Status: AC

## 2020-01-28 MED ORDER — ALBUTEROL SULFATE (2.5 MG/3ML) 0.083% IN NEBU
2.5000 mg | INHALATION_SOLUTION | Freq: Four times a day (QID) | RESPIRATORY_TRACT | Status: DC
  Administered 2020-01-28 – 2020-01-30 (×6): 2.5 mg via RESPIRATORY_TRACT
  Filled 2020-01-28 (×6): qty 3

## 2020-01-28 MED ORDER — VANCOMYCIN HCL IN DEXTROSE 1-5 GM/200ML-% IV SOLN
1000.0000 mg | Freq: Two times a day (BID) | INTRAVENOUS | Status: DC
  Administered 2020-01-29 – 2020-01-31 (×6): 1000 mg via INTRAVENOUS
  Filled 2020-01-28 (×7): qty 200

## 2020-01-28 MED ORDER — BENZTROPINE MESYLATE 0.5 MG PO TABS
0.5000 mg | ORAL_TABLET | Freq: Two times a day (BID) | ORAL | Status: DC
  Administered 2020-01-28 – 2020-02-15 (×37): 0.5 mg
  Filled 2020-01-28 (×39): qty 1

## 2020-01-28 MED ORDER — ACETAMINOPHEN 325 MG PO TABS: 325.0000 mg | ORAL_TABLET | ORAL | Status: DC | PRN

## 2020-01-28 MED ORDER — INSULIN ASPART 100 UNIT/ML ~~LOC~~ SOLN
0.0000 [IU] | SUBCUTANEOUS | Status: DC
  Administered 2020-01-31: 1 [IU] via SUBCUTANEOUS
  Administered 2020-01-31: 2 [IU] via SUBCUTANEOUS
  Administered 2020-01-31 – 2020-02-10 (×18): 1 [IU] via SUBCUTANEOUS
  Administered 2020-02-10: 2 [IU] via SUBCUTANEOUS
  Administered 2020-02-11 – 2020-02-14 (×13): 1 [IU] via SUBCUTANEOUS
  Filled 2020-01-28 (×32): qty 1

## 2020-01-28 MED ORDER — METOPROLOL TARTRATE 25 MG PO TABS
12.5000 mg | ORAL_TABLET | Freq: Two times a day (BID) | ORAL | Status: DC
  Administered 2020-01-28 – 2020-02-15 (×36): 12.5 mg
  Filled 2020-01-28 (×37): qty 1

## 2020-01-28 MED ORDER — THIAMINE HCL 100 MG PO TABS
200.0000 mg | ORAL_TABLET | Freq: Every day | ORAL | Status: DC
  Administered 2020-01-28 – 2020-02-15 (×18): 200 mg
  Filled 2020-01-28 (×18): qty 2

## 2020-01-28 MED ORDER — OXYCODONE HCL 5 MG/5ML PO SOLN
5.0000 mg | ORAL | Status: DC | PRN
  Administered 2020-01-31 – 2020-02-07 (×2): 5 mg
  Filled 2020-01-28 (×4): qty 5

## 2020-01-28 MED ORDER — SODIUM CHLORIDE 0.9% FLUSH
10.0000 mL | INTRAVENOUS | Status: DC | PRN
  Administered 2020-01-31 – 2020-02-11 (×2): 10 mL

## 2020-01-28 MED ORDER — ACETAMINOPHEN 650 MG RE SUPP: 650.0000 mg | Freq: Four times a day (QID) | RECTAL | Status: DC | PRN

## 2020-01-28 MED ORDER — VANCOMYCIN HCL IN DEXTROSE 1-5 GM/200ML-% IV SOLN
1000.0000 mg | Freq: Once | INTRAVENOUS | Status: AC
  Administered 2020-01-28: 1000 mg via INTRAVENOUS
  Filled 2020-01-28: qty 200

## 2020-01-28 MED ORDER — AMIODARONE HCL 200 MG PO TABS
100.0000 mg | ORAL_TABLET | Freq: Two times a day (BID) | ORAL | Status: DC
  Administered 2020-01-28 – 2020-02-15 (×36): 100 mg
  Filled 2020-01-28 (×37): qty 1

## 2020-01-28 MED ORDER — VANCOMYCIN HCL 750 MG/150ML IV SOLN: 750.0000 mg | Freq: Three times a day (TID) | INTRAVENOUS | Status: DC

## 2020-01-28 MED ORDER — EPHEDRINE SULFATE 50 MG/ML IJ SOLN
INTRAMUSCULAR | Status: DC | PRN
  Administered 2020-01-28 (×2): 10 mg via INTRAVENOUS

## 2020-01-28 MED ORDER — ONDANSETRON HCL 4 MG PO TABS: 4.0000 mg | ORAL_TABLET | Freq: Four times a day (QID) | ORAL | Status: DC | PRN

## 2020-01-28 MED ORDER — PROPOFOL 500 MG/50ML IV EMUL
INTRAVENOUS | Status: AC
  Filled 2020-01-28: qty 50

## 2020-01-28 MED ORDER — PIPERACILLIN-TAZOBACTAM 3.375 G IVPB
3.3750 g | Freq: Three times a day (TID) | INTRAVENOUS | Status: DC
  Administered 2020-01-28 – 2020-01-31 (×9): 3.375 g via INTRAVENOUS
  Filled 2020-01-28 (×9): qty 50

## 2020-01-28 MED ORDER — DEXMEDETOMIDINE HCL IN NACL 80 MCG/20ML IV SOLN
INTRAVENOUS | Status: AC
  Filled 2020-01-28: qty 20

## 2020-01-28 MED ORDER — JUVEN PO PACK
1.0000 | PACK | Freq: Three times a day (TID) | ORAL | Status: DC
  Administered 2020-01-29: 1

## 2020-01-28 MED ORDER — ONDANSETRON HCL 4 MG/2ML IJ SOLN: 4.0000 mg | Freq: Once | INTRAMUSCULAR | Status: DC | PRN

## 2020-01-28 MED ORDER — ACETAMINOPHEN 160 MG/5ML PO SOLN
650.0000 mg | Freq: Four times a day (QID) | ORAL | Status: DC | PRN
  Administered 2020-01-30 – 2020-02-15 (×7): 650 mg
  Filled 2020-01-28 (×10): qty 20.3

## 2020-01-28 MED ORDER — METRONIDAZOLE IN NACL 5-0.79 MG/ML-% IV SOLN
500.0000 mg | Freq: Three times a day (TID) | INTRAVENOUS | Status: DC
  Administered 2020-01-28 (×2): 500 mg via INTRAVENOUS
  Filled 2020-01-28 (×5): qty 100

## 2020-01-28 MED ORDER — SODIUM CHLORIDE 0.9 % IV SOLN
2.0000 g | Freq: Three times a day (TID) | INTRAVENOUS | Status: DC
  Administered 2020-01-28: 2 g via INTRAVENOUS
  Filled 2020-01-28 (×3): qty 2

## 2020-01-28 MED ORDER — FENTANYL CITRATE (PF) 100 MCG/2ML IJ SOLN: 25.0000 ug | INTRAMUSCULAR | Status: DC | PRN

## 2020-01-28 MED ORDER — ONDANSETRON HCL 4 MG/2ML IJ SOLN: 4.0000 mg | Freq: Four times a day (QID) | INTRAMUSCULAR | Status: DC | PRN

## 2020-01-28 MED ORDER — HYDRALAZINE HCL 50 MG PO TABS
50.0000 mg | ORAL_TABLET | Freq: Two times a day (BID) | ORAL | Status: DC
  Administered 2020-01-28 – 2020-02-09 (×25): 50 mg
  Filled 2020-01-28 (×25): qty 1

## 2020-01-28 MED ORDER — DILTIAZEM HCL 30 MG PO TABS
30.0000 mg | ORAL_TABLET | Freq: Four times a day (QID) | ORAL | Status: DC
  Administered 2020-01-28 – 2020-02-15 (×64): 30 mg
  Filled 2020-01-28 (×67): qty 1

## 2020-01-28 MED ORDER — IOHEXOL 300 MG/ML  SOLN
100.0000 mL | Freq: Once | INTRAMUSCULAR | Status: AC | PRN
  Administered 2020-01-28: 100 mL via INTRAVENOUS

## 2020-01-28 SURGICAL SUPPLY — 44 items
BLADE SURG 15 STRL LF DISP TIS (BLADE) ×2 IMPLANT
BLADE SURG 15 STRL SS (BLADE) ×4
BRIEF STRETCH MATERNITY 2XLG (MISCELLANEOUS) ×2 IMPLANT
CANISTER SUCT 1200ML W/VALVE (MISCELLANEOUS) ×4 IMPLANT
CANISTER WOUND CARE 500ML ATS (WOUND CARE) ×2 IMPLANT
CNTNR SPEC 2.5X3XGRAD LEK (MISCELLANEOUS) ×2
CONT SPEC 4OZ STER OR WHT (MISCELLANEOUS) ×2
CONT SPEC 4OZ STRL OR WHT (MISCELLANEOUS) ×2
CONTAINER SPEC 2.5X3XGRAD LEK (MISCELLANEOUS) IMPLANT
COVER WAND RF STERILE (DRAPES) ×4 IMPLANT
DECANTER SPIKE VIAL GLASS SM (MISCELLANEOUS) ×4 IMPLANT
DRAIN PENROSE 1/4X12 LTX STRL (WOUND CARE) IMPLANT
DRAPE LAPAROTOMY 100X77 ABD (DRAPES) ×4 IMPLANT
DRSG GAUZE FLUFF 36X18 (GAUZE/BANDAGES/DRESSINGS) ×4 IMPLANT
DRSG MEPILEX FLEX 3X3 (GAUZE/BANDAGES/DRESSINGS) ×2 IMPLANT
GAUZE PACKING IODOFORM 1/2 (PACKING) ×2 IMPLANT
GLOVE SURG SYN 7.0 (GLOVE) ×4 IMPLANT
GLOVE SURG SYN 7.0 PF PI (GLOVE) ×2 IMPLANT
GLOVE SURG SYN 7.5  E (GLOVE) ×4
GLOVE SURG SYN 7.5 E (GLOVE) ×2 IMPLANT
GLOVE SURG SYN 7.5 PF PI (GLOVE) ×2 IMPLANT
GOWN STRL REUS W/ TWL LRG LVL3 (GOWN DISPOSABLE) ×4 IMPLANT
GOWN STRL REUS W/TWL LRG LVL3 (GOWN DISPOSABLE) ×8
IV CATH ANGIO 14GX1.88 NO SAFE (IV SOLUTION) ×4 IMPLANT
KIT DRSG VAC SLVR GRANUFM (MISCELLANEOUS) ×4 IMPLANT
KIT TURNOVER KIT A (KITS) ×4 IMPLANT
LABEL OR SOLS (LABEL) ×2 IMPLANT
NEEDLE HYPO 22GX1.5 SAFETY (NEEDLE) ×4 IMPLANT
NS IRRIG 500ML POUR BTL (IV SOLUTION) ×4 IMPLANT
PACK BASIN MINOR ARMC (MISCELLANEOUS) ×4 IMPLANT
PAD ABD DERMACEA PRESS 5X9 (GAUZE/BANDAGES/DRESSINGS) ×4 IMPLANT
PULSAVAC PLUS IRRIG FAN TIP (DISPOSABLE) ×4
SOL PREP PVP 2OZ (MISCELLANEOUS) ×4
SOLUTION PREP PVP 2OZ (MISCELLANEOUS) ×2 IMPLANT
SPONGE LAP 18X18 RF (DISPOSABLE) ×2 IMPLANT
SURGILUBE 2OZ TUBE FLIPTOP (MISCELLANEOUS) ×4 IMPLANT
SUT ETH BLK MONO 3 0 FS 1 12/B (SUTURE) IMPLANT
SUT SILK 3-0 (SUTURE) ×2 IMPLANT
SWAB CULTURE AMIES ANAERIB BLU (MISCELLANEOUS) ×4 IMPLANT
SYR 10ML LL (SYRINGE) ×4 IMPLANT
SYR 20ML LL LF (SYRINGE) ×4 IMPLANT
SYR BULB IRRIG 60ML STRL (SYRINGE) ×4 IMPLANT
TIP FAN IRRIG PULSAVAC PLUS (DISPOSABLE) IMPLANT
TOWEL OR 17X26 4PK STRL BLUE (TOWEL DISPOSABLE) ×2 IMPLANT

## 2020-01-28 NOTE — Evaluation (Signed)
Occupational Therapy Evaluation Patient Details Name: Kyle [name] MRN: [id number] DOB: 08/26/1946 Today's Date: 01/28/2020    History of Present Illness Kyle [name] is a 74 y.o. male with medical history significant of COVID 11/02/2019.  CVA, prediabetes, PE around 2018 started on Xarelto, chronic tremors of bilateral hands, bipolar disorder, morbid obesity CKD HLD neuropathy chronic atrial fibrillation DM2 COPD, Parkinson disease. He has been in LTAC and was discharged to SNF on 01/25/20. Was febrile when arrived to SNF and sent here to ED.   Clinical Impression   Kyle [name] was seen for OT evaluation this date. Prior to hospital admission, pt was in LTAC. Pt was alert and responsive but unable to answer questions regarding home setup or prior level of function. Pt presents to acute OT demonstrating impaired ADL performance and functional mobility 2/2 global weakness and increased tone in BUE. Pt currently requires total assist +2 for bed mobility.  Pt would benefit from skilled OT to address noted impairments and functional limitations (see below for any additional details) in order to maximize safety and independence while minimizing falls risk and caregiver burden. Upon hospital discharge, recommend STR to maximize pt safety and return to PLOF.     Follow Up Recommendations  SNF    Equipment Recommendations       Recommendations for Other Services       Precautions / Restrictions Precautions Precautions: Fall Restrictions Weight Bearing Restrictions: No      Mobility Bed Mobility Overal bed mobility: Needs Assistance Bed Mobility: Sidelying to Sit;Sit to Sidelying   Sidelying to sit: Total assist;+2 for physical assistance     Sit to sidelying: Total assist;+2 for physical assistance General bed mobility comments: Patient does not attempt to move any extremity when directed to. Requires total assist at this time for bed mobilty.  Transfers                  General transfer comment: unsafe to attempt    Balance Overall balance assessment: Needs assistance Sitting-balance support: Bilateral upper extremity supported Sitting balance-Leahy Scale: Zero Sitting balance - Comments: requires +2 assist to achieve and maintain sitting up                                   ADL either performed or assessed with clinical judgement   ADL Overall ADL's : Needs assistance/impaired                                       General ADL Comments: Unable to assess - unclear cognition vs strength/ROM deficits. Increased tone t/o BUE limiting HOH assist for grooming ADLs. Currently TOTAL A for ADLs     Vision Baseline Vision/History: Wears glasses       Perception     Praxis      Pertinent Vitals/Pain       Hand Dominance     Extremity/Trunk Assessment Upper Extremity Assessment Upper Extremity Assessment: Generalized weakness;RUE deficits/detail;LUE deficits/detail;Difficult to assess due to impaired cognition RUE Deficits / Details: AROM unable to assess - unclear cognition vs strength. Increased tone t/o UE - unable to aciebe full fist or finger extension LUE Deficits / Details: AROM unable to assess - unclear cognition vs strength. Increased tone t/o UE - unable to aciebe full fist or finger extension   Lower Extremity Assessment Lower Extremity  Assessment: Generalized weakness;Difficult to assess due to impaired cognition       Communication Communication Communication: Expressive difficulties(garbled, repetitive speech)   Cognition Arousal/Alertness: Lethargic Behavior During Therapy: Flat affect Overall Cognitive Status: No family/caregiver present to determine baseline cognitive functioning                                 General Comments: patient attempts to answer questions, but unable to determine what he is saying most of the time. Unable to determine if he is A &O.   General  Comments  Mild edema B hands     Exercises Exercises: Other exercises Other Exercises Other Exercises: sup<>sit   Shoulder Instructions      Home Living Family/patient expects to be discharged to:: Skilled nursing facility                                 Additional Comments: Patient unable to provide any prior function or living situation      Prior Functioning/Environment                   OT Problem List: Decreased strength;Decreased range of motion;Decreased activity tolerance;Impaired balance (sitting and/or standing);Decreased coordination;Decreased cognition;Decreased safety awareness;Decreased knowledge of use of DME or AE;Decreased knowledge of precautions;Impaired tone;Impaired UE functional use;Increased edema      OT Treatment/Interventions: Self-care/ADL training;Therapeutic exercise;Neuromuscular education;Energy conservation;DME and/or AE instruction;Therapeutic activities;Cognitive remediation/compensation;Patient/family education;Balance training    OT Goals(Current goals can be found in the care plan section) Acute Rehab OT Goals Patient Stated Goal: none stated OT Goal Formulation: Patient unable to participate in goal setting Time For Goal Achievement: 02/11/20 Potential to Achieve Goals: Fair ADL Goals Pt Will Perform Grooming: with caregiver independent in assisting;sitting;with max assist Pt Will Transfer to Toilet: with max assist(at bed level)  OT Frequency: Min 1X/week   Barriers to D/C: Inaccessible home environment;Decreased caregiver support          Co-evaluation              AM-PAC OT "6 Clicks" Daily Activity     Outcome Measure Help from another person eating meals?: Total Help from another person taking care of personal grooming?: Total Help from another person toileting, which includes using toliet, bedpan, or urinal?: Total Help from another person bathing (including washing, rinsing, drying)?: Total Help from  another person to put on and taking off regular upper body clothing?: Total Help from another person to put on and taking off regular lower body clothing?: Total 6 Click Score: 6   End of Session    Activity Tolerance: Patient limited by lethargy;Other (comment)(Pt limited by inability to follow commands) Patient left: Other (comment)(In stretcher c both rails up)  OT Visit Diagnosis: Unsteadiness on feet (R26.81);Other abnormalities of gait and mobility (R26.89)                Time: 4081-4481 OT Time Calculation (min): 13 min Charges:  OT General Charges $OT Visit: 1 Visit OT Evaluation $OT Eval Moderate Complexity: 1 Mod  Dessie Coma, M.S. OTR/L  01/28/20, 12:52 PM

## 2020-01-28 NOTE — Op Note (Addendum)
Procedure Date:  01/28/2020  Pre-operative Diagnosis:  Infected sacral decubitus wound  Post-operative Diagnosis:  1.  Infected sacral decubitus wound 2.  Infected left hip/leg decubitus wound 3.  Infected left ischial decubitus wound 4.  Stage I right ischial decubitus wound  Procedure:   1.  Incision and Drainage, with extensive debridement of skin, subcutaneous tissue, and fascia of infected sacral decubitus wound (15 cm x 15 cm) and infected left hip/leg decubitus wound (10 cm x 5 cm), and debridement of skin and subcutaneous tissue of infected left ischial decubitus wound (2 cm x 2 cm), for total area of 279 sqcm. 2.  Sacral bone biopsy 3.  Application of negative pressure dressing > 50 cm  Surgeon:  Howie Ill, MD  Anesthesia:  General endotracheal  Estimated Blood Loss:  30 ml  Specimens:   1.  Sacral bone biopsy for culture 2.  Sacral decubitus wound tissue for culture 3.  Left hip/leg decubitus culture swab 4.  Left hip/leg decubitus wound tissue for culture  Complications:  None  Indications for Procedure:  This is a 74 y.o. male with diagnosis of worsening sacral decubitus wound, requiring debridement procedure.  The risks of bleeding, abscess or infection, injury to surrounding structures, and need for further procedures were all discussed with the patient's sister and she was willing to proceed.  Description of Procedure: The patient was correctly identified in the preoperative area and brought into the operating room.  The patient was placed supine with VTE prophylaxis in place.  Appropriate time-outs were performed.  Anesthesia was induced.  Appropriate antibiotics were infused.  The patient was then placed in right lateral decubitus position.  The patient's sacral area, perianal area, and left hip were prepped and draped in usual sterile fashion. We started with the sacral wound, and extensive debridement of necrotic tissue was performed both using scalpel and  cautery.  The debridement included skin, subcutaneous tissue, and fascia.  The sacral bone was not fully exposed, but a bone biopsy was obtained as well and sent for culture to evaluate for osteomyelitis.  The wound overall measured 15 x 15 cm.  Cautery was used for hemostasis and a figure eight 3-0 Silk suture was used as well.  Then we proceeded to the left hip wound.  This initially consisted of an eschar, but after starting the debridement, it was obvious it was much deeper.  There was some purulent fluid which was swabbed for culture as well.  The debridement was also extensive, including skin, subcutaneous tissue, and fascia.  The infection did reach fascia, but did not appear to go through it.  Overall, the wound measured 10 x 5 cm after debridement.  Then we debrided the small ischial wound which was about 2 x 2 cm with debridement of skin and subcutaneous tissue.  All the debridements were done with scalpel and cautery.  Then we proceed to thoroughly irrigate the wounds with pulse lavage of 2+ liters.  Cautery was used for hemostasis afterwards.    We then packed the small ischial wound with 1/2 inch iodoform gauze, and covered it with foam dressing.  Then the two other wounds were dressed with silver sponge wound vac, connecting the two and bridging anteriorly to the left thigh so the suction disc would not be in an area of pressure.  This was connected to suction with good seal.  The patient was then emerged from anesthesia, turned supine again, and brought to the recovery room for further management.  The patient tolerated the procedure well and all counts were correct at the end of the case.   Melvyn Neth, MD

## 2020-01-28 NOTE — Social Work (Addendum)
TOC CM/SW consult received for SNF.   Social work assessment in progress.   2820 Patient is from [facility] SW called [facility] regarding SNF  [phone] Telephone consult to the patient's sister [phone].  Stated that she would like the patient to return to [employer].   Larwance Rote, MSW, LCSW  [phone] 8am-6pm (weekends)
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
## 2020-01-28 NOTE — Progress Notes (Signed)
Pharmacy Antibiotic Note  Kyle [name] is a 74 y.o. male admitted on 01/27/2020 with sepsis.  Pharmacy has been consulted for Cefepime and Vancomycin dosing.  Plan: 1. Continue Cefepime 2gm IV q8hrs for CrCl >19ml.min   2. Adjust dose to Vancomycin 1000 mg IV Q 12 hrs. Goal AUC 400-550. Expected AUC: 437 SCr used: 0.8 Css: 12.2    Height: 5\' 9"  (175.3 cm) Weight: 190 lb (86.2 kg) IBW/kg (Calculated) : 70.7  Temp (24hrs), Avg:100 F (37.8 C), Min:98.3 F (36.8 C), Max:101.8 F (38.8 C)  Recent Labs  Lab 01/23/20 0612 01/27/20 2242 01/27/20 2323 01/28/20 0419  WBC  --  11.3*  --  9.0  CREATININE 0.49* 0.51*  --  0.43*  LATICACIDVEN  --   --  1.7  --     Estimated Creatinine Clearance: 90.8 mL/min (A) (by C-G formula based on SCr of 0.43 mg/dL (L)).    No Known Allergies  Antimicrobials this admission: Cefepime 3/6 >>  Vancomycin 3/6 >>    Microbiology results:  03/29/20   Thank you for allowing pharmacy to be a part of this patient's care.  VHA:[id number], PharmD, BCPS Clinical Pharmacist 01/28/2020 8:25 AM

## 2020-01-28 NOTE — ED Notes (Signed)
Surgery at bedside to assess sacral ulcer. Pt was turned by staff. Placed towels under pt to keep on on L side at this time.

## 2020-01-28 NOTE — Progress Notes (Signed)
Pt dressings- 1&2-sacral and left hip wound vac 3-left lower hip iodoform packing, mepilex 4- right hip mepilex. All dressings dry and intact

## 2020-01-28 NOTE — Transfer of Care (Signed)
Immediate Anesthesia Transfer of Care Note  Patient: Kyle [name]  Procedure(s) Performed: IRRIGATION AND DEBRIDEMENT BUTTOCKS (N/A Buttocks) APPLICATION OF WOUND VAC TO BUTTOCKS AND LEFT HIP WOUNDS (N/A Buttocks) IRRIGATION AND DEBRIDEMENT WOUND LEFT HIP (Left Hip)  Patient Location: PACU  Anesthesia Type:MAC  Level of Consciousness: awake  Airway & Oxygen Therapy: Patient Spontanous Breathing  Post-op Assessment: Report given to RN and Post -op Vital signs reviewed and stable  Post vital signs: Reviewed and stable  Last Vitals:  Vitals Value Taken Time  BP    Temp    Pulse    Resp    SpO2      Last Pain:  Vitals:   01/28/20 0636  TempSrc: Rectal         Complications: No apparent anesthesia complications

## 2020-01-28 NOTE — Progress Notes (Signed)
Pharmacy Antibiotic Note  Kyle [name] is a 74 y.o. male admitted on 01/27/2020 with sepsis.  Pharmacy has been consulted for Cefepime and Vancomycin dosing.  Plan: Cefepime 2gm IV q8hrs  Vancomycin 750 mg IV Q 8 hrs. Goal AUC 400-550. Expected AUC: 492.1, estimated trough level ~ 16.2 SCr used: 0.8   Height: 5\' 9"  (175.3 cm) Weight: 190 lb (86.2 kg) IBW/kg (Calculated) : 70.7  Temp (24hrs), Avg:100.8 F (38.2 C), Min:99.8 F (37.7 C), Max:101.8 F (38.8 C)  Recent Labs  Lab 01/21/20 0409 01/23/20 0612 01/27/20 2242 01/27/20 2323  WBC 15.1*  --  11.3*  --   CREATININE 0.56* 0.49* 0.51*  --   LATICACIDVEN  --   --   --  1.7    Estimated Creatinine Clearance: 90.8 mL/min (A) (by C-G formula based on SCr of 0.51 mg/dL (L)).    No Known Allergies  Antimicrobials this admission: Cefepime 3/6 >>  Vancomycin 3/6 >>   Dose adjustments this admission:   Microbiology results:  BCx:   UCx:    Sputum:    MRSA PCR:   Thank you for allowing pharmacy to be a part of this patient's care.  03/28/20 A 01/28/2020 3:00 AM

## 2020-01-28 NOTE — ED Notes (Signed)
IV team at bedside 

## 2020-01-28 NOTE — Progress Notes (Signed)
Pt noted to have right upper arm SL midline placed prior to arrival from another facility. Site care and dsg changed per protocol. Site wnl. Cap changed, flushed with 32ml ns with gbr. OK to use midline per MD. Suggest PICC placement if pt is admitted and needs additional vancomycin.   Gilman Schmidt, RN [facility]

## 2020-01-28 NOTE — Progress Notes (Signed)
PROGRESS NOTE  Demarkis Gheen JSE:[id number] DOB: 11/27/1945 DOA: 01/27/2020 PCP: Patient, No Pcp Per  Brief History   Acen Craun is a 74 y.o. male with medical history significant of COVID 11/02/2019. CVA, prediabetes, PE around 2018 started on Xarelto, chronic tremors of bilateral hands, bipolar disorder, morbid obesity, CKD, HLD, neuropathy, chronic atrial fibrillation, DM2, COPD, and Parkinson disease. He presented with fever from SNF. He had been discharged from SNF after a 10 day inpatient stay for acute hypoxemic respiratory failure due to COVID-19. During this stay the patient was treated with Remdesivir, decadron, Rocephin and azithromycin.   In December patient was admitted for Covid with acute respiratory hypoxemic failure treated remdesivir 10 days of Decadron and Rocephin and azithromycin for possible bacterial superinfection in [place]. This was followed by a roughly three week stay at [facility]. This was followed by a three day stay at [facility].   His hospital stay in [place] was complicated by tachycardia patient was seen by cardiology started Cardizem metoprolol. The patient suffered respiratory arrest on 24 November 2019 and had to be intubated. He had an EEG showing slowing consistent with mild residual encephalopathy patient required tracheostomy and PEG tube placement. He later on developed pressure sacral decubitus ulcer which required debridement.He required broad-spectrum antibiotics IV vancomycin and Zosyn. He then transferred to [facility] in [place]. His antibiotics were later simplified to cefepime.  His trach was de cannulated. Patient remains nonverbal. In February he developed pneumonia worrisome for aspiration and was treated Unasyn. He failed trial of decannulation plan for him to have a ENT consult done to place an hour trach he had copious secretion requiring frequent suctioning as well as nasotracheal suctioning. Patient was finally yesterday discharged  from [place] to [facility] 01/25/2020, but continued to be febrile. As soon as he presented to facility and was transferred to [facility] emergency department today.  Consultants  . General Surgery  Procedures  . I & D of Stage IV sacral decubitus ulcer  Antibiotics   Anti-infectives (From admission, onward)   Start     Dose/Rate Route Frequency Ordered Stop   01/28/20 1200  vancomycin (VANCOREADY) IVPB 750 mg/150 mL  Status:  Discontinued     750 mg 150 mL/hr over 60 Minutes Intravenous Every 8 hours 01/28/20 0300 01/28/20 0823   01/28/20 1200  vancomycin (VANCOCIN) IVPB 1000 mg/200 mL premix     1,000 mg 200 mL/hr over 60 Minutes Intravenous Every 12 hours 01/28/20 0825     01/28/20 0800  ceFEPIme (MAXIPIME) 2 g in sodium chloride 0.9 % 100 mL IVPB     2 g 200 mL/hr over 30 Minutes Intravenous Every 8 hours 01/28/20 0240     01/28/20 0245  vancomycin (VANCOCIN) IVPB 1000 mg/200 mL premix     1,000 mg 200 mL/hr over 60 Minutes Intravenous  Once 01/28/20 0241 01/28/20 0534   01/28/20 0200  metroNIDAZOLE (FLAGYL) IVPB 500 mg     500 mg 100 mL/hr over 60 Minutes Intravenous Every 8 hours 01/28/20 0108     01/27/20 2230  vancomycin (VANCOCIN) IVPB 1000 mg/200 mL premix     1,000 mg 200 mL/hr over 60 Minutes Intravenous  Once 01/27/20 2221 01/28/20 0028   01/27/20 2230  ceFEPIme (MAXIPIME) 2 g in sodium chloride 0.9 % 100 mL IVPB     2 g 200 mL/hr over 30 Minutes Intravenous  Once 01/27/20 2221 01/27/20 2356    .  Subjective  The patient is resting comfortably. He is non-verbal at  baseline.  Objective   Vitals:  Vitals:   01/28/20 1524 01/28/20 1539  BP: 109/66 122/60  Pulse: 64 (!) 59  Resp: 17 14  Temp:    SpO2: 100% 98%   Exam:  Constitutional:  . The patient is awake. No acute distress. Respiratory:  . No increased work of breathing. . No wheezes, rales, or rhonchi . No tactile fremitus Cardiovascular:  . Regular rate and rhythm . No murmurs, ectopy, or gallups. . No  lateral PMI. No thrills. Abdomen:  . Abdomen is soft, non-tender, non-distended . No hernias, masses, or organomegaly . Normoactive bowel sounds.  Musculoskeletal:  . No cyanosis, clubbing, or edema Skin:  . No rashes . Large sacral decubitus ulcer with necrotic tissue . palpation of skin: no induration or nodules Neurologic:  . CN 2-12 intact . Sensation all 4 extremities intact Psychiatric:  . Unable to evaluate as the patient is unable to cooperate with exam.  I have personally reviewed the following:   Today's Data  . Vitals, CBC, CMP  Micro Data  . Blood cultures x 2 - no growth . Wound cultures - pending  Imaging  . CT abdomen and pelvis: Sacral decubitus ulcer extending to bone with fluid collection in the central aspect of the gluteal musculature that is suspicious for abscess. Patchy and streaky opacities in the lung bases may represent pneumonia. . CXR Interstitial and ill-defined airspace opacities at the bilateral lung bases persis, but have improved as compared to CXR 01/21/2020.   Cardiology Data  . EKG: Repolarization abnormality vs severe global ischemia. New since 08/2019.  Scheduled Meds: . [MAR Hold] albuterol  2.5 mg Nebulization QID  . [MAR Hold] amiodarone  100 mg Per Tube BID  . [MAR Hold] benztropine  0.5 mg Per Tube BID  . [MAR Hold] carbidopa-levodopa  1 tablet Per Tube BID  . [MAR Hold] diltiazem  30 mg Per Tube QID  . [MAR Hold] famotidine  20 mg Per Tube Daily  . [MAR Hold] hydrALAZINE  50 mg Per Tube BID  . [MAR Hold] insulin aspart  0-9 Units Subcutaneous Q4H  . [MAR Hold] metoprolol tartrate  12.5 mg Per Tube BID  . [MAR Hold] nutrition supplement (JUVEN)  1 packet Per Tube TID  . [MAR Hold] thiamine  200 mg Per Tube Daily   Continuous Infusions: . [MAR Hold] ceFEPime (MAXIPIME) IV Stopped (01/28/20 0820)  . [MAR Hold] metronidazole Stopped (01/28/20 1017)  . [MAR Hold] vancomycin      Active Problems:   Hypertension   Diabetes  ([place])   Chronic atrial fibrillation (HCC)   SIRS (systemic inflammatory response syndrome) ([place])   HCAP (healthcare-associated pneumonia)   Sepsis ([place])   LOS: 0 days   A & P  . Sepsis ([place]) Due to sacral decubitus ulcer. In ED upon presentation the patient demonstrated fever, tachycardia, tachypnea, and hypotension. WBC was elevated. Lactic acid was 1.7 and procalcitonin was 0.28. Initially this was felt to be due to pneumonia. However the patient had received treatment with Unasyn for pneumonia in February. CXR demonstrated persistent radiological evidence of pneumonia, but clinically the patient has no adventitious lung sounds, no cough, and he is saturating 98% on room air. However, CT does show evidence of Stage IV decubitus sacral ulcer with abscess. Possible bone involvement. He has gone for I & D with general surgery. He will likely need to go back on Tuesday. Patient had a very complex prolonged post Covid stay.  No discharge summary but  per records apparently was discharged on Augmentin with Mid line in place no IV antibiotics were ordered at the time of discharge to SNF.  MId line does not appear to be infected and functional.   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 S/P I & D with general surgery. He will likely need to go back on Tuesday. Will consult ID in the am. The patient is receiving IV Zosyn and Vancomycin. The patient has a history of shrapnel would not be a good candidate for MRI. CRP elevated at 9.7. ESR is 126.  Possible recurrent aspiration pneumonia versus HCAP: Speech pathology will be asked to evaluate the patient's swallow.  Patient status post PEG tube.   Hypertension: Chronic. Continue home medications.   Chronic atrial fibrillation (HCC) - The patient has a CHADS2VASC score of >3. Resume eliquis when cleared by general surgery. Rate is well controlled on metoprolol and amiodarone which have been continued.   DM 2: Hb A1c is 4.2.  Glucoses to be followed by FSBS and sensitive  SSI. Oral antihyperglycemics have been held. He will be on a carbohydrate controlled diet and hypoglycemic protocol.  Parkinson disease: Noted. Likely playing a part in patient's history of aspiration. Continue home medications.  Encephalopathy: Status post hypoxic respiratory arrest requiring intubation in the setting of Covid in December 2020.  Patient with some minimal improvements per family started to respond to his name at times but mainly remains nonverbal bedbound incontinent status post PEG tube.  Lurline Idol has been decannulated per LTAC notes if patient has significant secretions may need to replace tracheostomy.  At this point appears to be stable.  I have seen and examined this patient myself. I have spent 42 minutes in his evaluation and care. More than 1/2 of this has been spent in coordination of care with general surgery. I have reviewed the patient's records availabel in [facility] and Care Everywhere.  DVT prophylaxis:  SCD resume eliquis if no surgical intervention Code Status:    DNR/DNI   as per family  I had personally discussed CODE STATUS with  Family  Family Communication:   Family not at  Bedside plan of care was discussed on the phone with sister by admitting physician. Disposition Plan: Back to current facility when stable     Kelsie Zaborowski, DO [facility] Direct contact: see [email or web address]  7PM-7AM contact night coverage as above  01/28/2020, 3:53 PM  LOS: 0 days

## 2020-01-28 NOTE — Evaluation (Signed)
Physical Therapy Evaluation Patient Details Name: Kyle [name] MRN: [id number] DOB: 1946/01/29 Today's Date: 01/28/2020   History of Present Illness  Kyle [name] is a 74 y.o. male with medical history significant of COVID 11/02/2019.  CVA, prediabetes, PE around 2018 started on Xarelto, chronic tremors of bilateral hands, bipolar disorder, morbid obesity CKD HLD neuropathy chronic atrial fibrillation DM2 COPD, Parkinson disease. He has been in LTAC and was discharged to SNF on 01/25/20. Was febrile when arrived to SNF and sent here to ED.    Clinical Impression  Patient received in bed, alert, attempting to verbalize but often difficult to understand. Unable to answer questions regarding home life or prior functional level. Patient is globally very weak. Does not assist with mobility at all at this time. Required total assist +2 for bed mobility this visit. He will continue to benefit from skilled PT while here to improve strength and functional independence.      Follow Up Recommendations SNF    Equipment Recommendations  Other (comment)(TBD)    Recommendations for Other Services       Precautions / Restrictions Precautions Precautions: Fall Restrictions Weight Bearing Restrictions: No      Mobility  Bed Mobility Overal bed mobility: Needs Assistance Bed Mobility: Sidelying to Sit;Sit to Sidelying   Sidelying to sit: Total assist;+2 for physical assistance     Sit to sidelying: Total assist;+2 for physical assistance General bed mobility comments: Patient does not attempt to move any extremity when directed to. Requires total assist at this time for bed mobilty.  Transfers                 General transfer comment: unsafe to attempt  Ambulation/Gait             General Gait Details: unable  Stairs            Wheelchair Mobility    Modified Rankin (Stroke Patients Only)       Balance Overall balance assessment: Needs  assistance Sitting-balance support: Bilateral upper extremity supported Sitting balance-Leahy Scale: Zero Sitting balance - Comments: requires +2 assist to achieve and maintain sitting up                                     Pertinent Vitals/Pain      Home Living Family/patient expects to be discharged to:: Skilled nursing facility                 Additional Comments: Patient unable to provide any prior function or living situation    Prior Function                 Hand Dominance        Extremity/Trunk Assessment   Upper Extremity Assessment Upper Extremity Assessment: Defer to OT evaluation    Lower Extremity Assessment Lower Extremity Assessment: Generalized weakness;Difficult to assess due to impaired cognition       Communication   Communication: Expressive difficulties(garbled, incoherent speech)  Cognition Arousal/Alertness: Lethargic Behavior During Therapy: Flat affect Overall Cognitive Status: No family/caregiver present to determine baseline cognitive functioning                                 General Comments: patient attempts to answer questions, but unable to determine what he is saying most of the time. Unable to determine if he is  A &O.      General Comments      Exercises     Assessment/Plan    PT Assessment Patient needs continued PT services  PT Problem List Decreased strength;Decreased mobility;Decreased activity tolerance;Decreased balance;Decreased safety awareness;Decreased cognition;Obesity       PT Treatment Interventions Therapeutic exercise;Balance training;Functional mobility training;Therapeutic activities;Patient/family education;Gait training;Neuromuscular re-education;DME instruction    PT Goals (Current goals can be found in the Care Plan section)  Acute Rehab PT Goals Patient Stated Goal: none stated PT Goal Formulation: Patient unable to participate in goal setting Time For Goal  Achievement: 02/11/20    Frequency Min 2X/week   Barriers to discharge Inaccessible home environment;Decreased caregiver support      Co-evaluation               AM-PAC PT "6 Clicks" Mobility  Outcome Measure Help needed turning from your back to your side while in a flat bed without using bedrails?: Total Help needed moving from lying on your back to sitting on the side of a flat bed without using bedrails?: Total Help needed moving to and from a bed to a chair (including a wheelchair)?: Total Help needed standing up from a chair using your arms (e.g., wheelchair or bedside chair)?: Total Help needed to walk in hospital room?: Total Help needed climbing 3-5 steps with a railing? : Total 6 Click Score: 6    End of Session   Activity Tolerance: Patient limited by lethargy Patient left: in bed Nurse Communication: Mobility status PT Visit Diagnosis: Other abnormalities of gait and mobility (R26.89);Muscle weakness (generalized) (M62.81)    Time: 6270-3500 PT Time Calculation (min) (ACUTE ONLY): 13 min   Charges:   PT Evaluation $PT Eval Moderate Complexity: 1 Mod          Jasman Pfeifle, PT, GCS 01/28/20,12:24 PM

## 2020-01-29 DIAGNOSIS — R6510 Systemic inflammatory response syndrome (SIRS) of non-infectious origin without acute organ dysfunction: Secondary | ICD-10-CM

## 2020-01-29 DIAGNOSIS — D638 Anemia in other chronic diseases classified elsewhere: Secondary | ICD-10-CM

## 2020-01-29 DIAGNOSIS — R1310 Dysphagia, unspecified: Secondary | ICD-10-CM

## 2020-01-29 LAB — COMPREHENSIVE METABOLIC PANEL
ALT: 14 U/L (ref 0–44)
AST: 29 U/L (ref 15–41)
Albumin: 2.1 g/dL — ABNORMAL LOW (ref 3.5–5.0)
Alkaline Phosphatase: 130 U/L — ABNORMAL HIGH (ref 38–126)
Anion gap: 6 (ref 5–15)
BUN: 28 mg/dL — ABNORMAL HIGH (ref 8–23)
CO2: 24 mmol/L (ref 22–32)
Calcium: 10.6 mg/dL — ABNORMAL HIGH (ref 8.9–10.3)
Chloride: 114 mmol/L — ABNORMAL HIGH (ref 98–111)
Creatinine, Ser: 0.47 mg/dL — ABNORMAL LOW (ref 0.61–1.24)
GFR calc Af Amer: >60 mL/min (ref >60)
GFR calc non Af Amer: >60 mL/min (ref >60)
Glucose, Bld: 75 mg/dL (ref 70–99)
Potassium: 3.3 mmol/L — ABNORMAL LOW (ref 3.5–5.1)
Sodium: 144 mmol/L (ref 135–145)
Total Bilirubin: 0.5 mg/dL (ref 0.3–1.2)
Total Protein: 6.5 g/dL (ref 6.5–8.1)

## 2020-01-29 LAB — CBC
HCT: 24.4 % — ABNORMAL LOW (ref 39.0–52.0)
Hemoglobin: 7.2 g/dL — ABNORMAL LOW (ref 13.0–17.0)
MCH: 32.9 pg (ref 26.0–34.0)
MCHC: 29.5 g/dL — ABNORMAL LOW (ref 30.0–36.0)
MCV: 111.4 fL — ABNORMAL HIGH (ref 80.0–100.0)
Platelets: 394 10*3/uL (ref 150–400)
RBC: 2.19 MIL/uL — ABNORMAL LOW (ref 4.22–5.81)
RDW: 16.9 % — ABNORMAL HIGH (ref 11.5–15.5)
WBC: 7.2 10*3/uL (ref 4.0–10.5)
nRBC: 0.3 % — ABNORMAL HIGH (ref 0.0–0.2)

## 2020-01-29 LAB — GLUCOSE, CAPILLARY
Glucose-Capillary: 100 mg/dL — ABNORMAL HIGH (ref 70–99)
Glucose-Capillary: 104 mg/dL — ABNORMAL HIGH (ref 70–99)
Glucose-Capillary: 117 mg/dL — ABNORMAL HIGH (ref 70–99)
Glucose-Capillary: 120 mg/dL — ABNORMAL HIGH (ref 70–99)
Glucose-Capillary: 72 mg/dL (ref 70–99)
Glucose-Capillary: 74 mg/dL (ref 70–99)
Glucose-Capillary: 77 mg/dL (ref 70–99)

## 2020-01-29 LAB — PROCALCITONIN: Procalcitonin: 0.2 ng/mL

## 2020-01-29 MED ORDER — POTASSIUM CHLORIDE CRYS ER 20 MEQ PO TBCR
40.0000 meq | EXTENDED_RELEASE_TABLET | Freq: Once | ORAL | Status: AC
  Administered 2020-01-29: 40 meq via ORAL
  Filled 2020-01-29: qty 2

## 2020-01-29 MED ORDER — JEVITY 1.5 CAL/FIBER PO LIQD
1000.0000 mL | ORAL | Status: AC
  Administered 2020-01-29 – 2020-02-04 (×6): 1000 mL via ORAL

## 2020-01-29 MED ORDER — FREE WATER
100.0000 mL | Status: DC
  Administered 2020-01-29 – 2020-02-01 (×15): 100 mL

## 2020-01-29 MED ORDER — JUVEN PO PACK
1.0000 | PACK | Freq: Two times a day (BID) | ORAL | Status: AC
  Administered 2020-01-29 – 2020-02-04 (×7): 1

## 2020-01-29 MED ORDER — PRO-STAT SUGAR FREE PO LIQD
30.0000 mL | Freq: Every day | ORAL | Status: AC
  Administered 2020-01-31 – 2020-02-04 (×4): 30 mL

## 2020-01-29 NOTE — Progress Notes (Signed)
[facility] SURGICAL PROGRESS NOTE  Hospital Day(s): 1.   Post op day(s): 1 Day Post-Op.   Interval History: Patient seen and examined, no acute events or new complaints overnight. Patient is essentially non verbal and does not contribute to the history. He has a wound vac in place to sacral and left trochanter which is bridged anteriorly. No fevers. Hypokalemia - 3.3, Wbc - 7.2k, Hgb 7.2. Plan to return to the OR tomorrow for second look and vac change.   Vital signs in last 24 hours: [min-max] current  Temp:  [97.1 F (36.2 C)-98.6 F (37 C)] 98.6 F (37 C) (03/08 0419) Pulse Rate:  [59-72] 67 (03/08 0419) Resp:  [13-27] 18 (03/07 1720) BP: (103-161)/(45-109) 103/45 (03/08 0419) SpO2:  [92 %-100 %] 97 % (03/08 0419)     Height: 5' 9" (175.3 cm) Weight: 86.2 kg BMI (Calculated): 28.05   Intake/Output last 2 shifts:  03/07 0701 - 03/08 0700 In: 750 [I.V.:750] Out: 1100 [Urine:1050; Blood:50]   Physical Exam:  Constitutional: alert, cooperative and no distress  Respiratory: breathing non-labored at rest  Cardiovascular: regular rate and sinus rhythm  Integumentary: Wound vac to the sacrum and left trochanteric which are bridged together and then this is bridged further to the left anterior thigh, good seal.    Labs:  CBC Latest Ref Rng & Units 01/29/2020 01/28/2020 01/27/2020  WBC 4.0 - 10.5 K/uL 7.2 9.0 11.3(H)  Hemoglobin 13.0 - 17.0 g/dL 7.2(L) 7.4(L) 8.4(L)  Hematocrit 39.0 - 52.0 % 24.4(L) 25.0(L) 29.5(L)  Platelets 150 - 400 K/uL 394 393 446(H)   CMP Latest Ref Rng & Units 01/29/2020 01/28/2020 01/27/2020  Glucose 70 - 99 mg/dL 75 108(H) 113(H)  BUN 8 - 23 mg/dL 28(H) 40(H) 45(H)  Creatinine 0.61 - 1.24 mg/dL 0.47(L) 0.43(L) 0.51(L)  Sodium 135 - 145 mmol/L 144 145 143  Potassium 3.5 - 5.1 mmol/L 3.3(L) 3.3(L) 3.7  Chloride 98 - 111 mmol/L 114(H) 108 107  CO2 22 - 32 mmol/L 24 26 27  Calcium 8.9 - 10.3 mg/dL 10.6(H) 11.6(H) 12.2(H)  Total Protein 6.5 - 8.1  g/dL 6.5 7.0 7.9  Total Bilirubin 0.3 - 1.2 mg/dL 0.5 0.5 0.5  Alkaline Phos 38 - 126 U/L 130(H) 145(H) 168(H)  AST 15 - 41 U/L 29 38 51(H)  ALT 0 - 44 U/L 14 65(H) 64(H)     Imaging studies: No new pertinent imaging studies   Assessment/Plan:  74 y.o. male 1 Day Post-Op s/p extensive sacral decubitus debridement, bone biopsy, and wound vac appliocation for infected sacral decubitus wound, complicated by multiple pertinent comorbidities.    - NPO after midnight, speech evaluation for swallowing  - Continue IV Abx (Vancomycin + Zosyn); follow up cultures and bon biopsy from OR  - Continue IVF  - Replete electrolytes  - Continue frequent positioning changes, offloading, low air loss mattress  - Will plan on return to OR tomorrow (03/09) for second look and wound vac change   - further management per primary team   All of the above findings and recommendations were discussed with the medical team  -- Leya Paige, PA-C [facility] 01/29/2020, 7:20 AM [phone] M-F: 7am - 4pm  

## 2020-01-29 NOTE — Progress Notes (Signed)
Physical Therapy Treatment Patient Details Name: Kyle [name] MRN: [id number] DOB: Apr 08, 1946 Today's Date: 01/29/2020    History of Present Illness Kyle [name] is a 74 y.o. male with medical history significant of COVID 11/02/2019.  CVA, prediabetes, PE around 2018 started on Xarelto, chronic tremors of bilateral hands, bipolar disorder, morbid obesity CKD HLD neuropathy chronic atrial fibrillation DM2 COPD, Parkinson disease. He has been in [place] and was discharged to SNF on 01/25/20. Was febrile when arrived to SNF and sent here to ED.    PT Comments    OT in roon upon arrival needing assist for linen change.  1- unit billed each discipline for co-tx.  Rolling left/right with max a x 2/dependant for complete bed change.  He is unable assist with rolling or holding position once in side lying.  BLE AA/PROM for available ranges in supine but air mattress limits ranges and he does not seem to assist despite cues.  Sitting EOB deferred due to level of participation and engagement.   Follow Up Recommendations  SNF     Equipment Recommendations       Recommendations for Other Services       Precautions / Restrictions Precautions Precautions: Fall Restrictions Weight Bearing Restrictions: No    Mobility  Bed Mobility               General bed mobility comments: deferred as he does not assist with rolling or AAROM BLE  Transfers                    Ambulation/Gait                 Stairs             Wheelchair Mobility    Modified Rankin (Stroke Patients Only)       Balance       Sitting balance - Comments: deferred                                    Cognition Arousal/Alertness: Lethargic Behavior During Therapy: Flat affect Overall Cognitive Status: Impaired/Different from baseline                                 General Comments: wife in, reports he is more alert today and answers questions  appropraitely      Exercises Other Exercises Other Exercises: rolling left/right for linen change and BLE AA/PROM as available on air mattress.    General Comments        Pertinent Vitals/Pain Pain Assessment: No/denies pain    Home Living                      Prior Function            PT Goals (current goals can now be found in the care plan section) Progress towards PT goals: Progressing toward goals    Frequency    Min 2X/week      PT Plan Current plan remains appropriate    Co-evaluation PT/OT/SLP Co-Evaluation/Treatment: Yes   PT goals addressed during session: Mobility/safety with mobility OT goals addressed during session: ADL's and self-care      AM-PAC PT "6 Clicks" Mobility   Outcome Measure  Help needed turning from your back to your side while in a flat bed without using bedrails?:  Total Help needed moving from lying on your back to sitting on the side of a flat bed without using bedrails?: Total Help needed moving to and from a bed to a chair (including a wheelchair)?: Total Help needed standing up from a chair using your arms (e.g., wheelchair or bedside chair)?: Total Help needed to walk in hospital room?: Total Help needed climbing 3-5 steps with a railing? : Total 6 Click Score: 6    End of Session   Activity Tolerance: Patient limited by lethargy Patient left: in bed;with family/visitor present;with call bell/phone within reach Nurse Communication: Mobility status       Time: 1125-1141 PT Time Calculation (min) (ACUTE ONLY): 16 min  Charges:  $Therapeutic Activity: 8-22 mins                    Danielle Dess, PTA 01/29/20, 12:38 PM

## 2020-01-29 NOTE — Progress Notes (Signed)
PROGRESS NOTE  Kyle [name] RFF:[id number] DOB: 31-Mar-1946 DOA: 01/27/2020 PCP: [facility]  Brief History   Kyle [name] is a 74 y.o. male with medical history significant of COVID 11/02/2019. CVA, prediabetes, PE around 2018 started on Xarelto, chronic tremors of bilateral hands, bipolar disorder, morbid obesity, CKD, HLD, neuropathy, chronic atrial fibrillation, DM2, COPD, and Parkinson disease. He presented with fever from SNF. He had been discharged from SNF after a 10 day inpatient stay for acute hypoxemic respiratory failure due to COVID-19. During this stay the patient was treated with Remdesivir, decadron, Rocephin and azithromycin.   In December patient was admitted for Covid with acute respiratory hypoxemic failure treated remdesivir 10 days of Decadron and Rocephin and azithromycin for possible bacterial superinfection in [place]. This was followed by a roughly three week stay at [facility]. This was followed by a three day stay at [facility]   His hospital stay in [place] was complicated by tachycardia patient was seen by cardiology started Cardizem metoprolol. The patient suffered respiratory arrest on 24 November 2019 and had to be intubated. He had an EEG showing slowing consistent with mild residual encephalopathy patient required tracheostomy and PEG tube placement. He later on developed pressure sacral decubitus ulcer which required debridement.He required broad-spectrum antibiotics IV vancomycin and Zosyn. He then transferred to [facility] in [place]. His antibiotics were later simplified to cefepime.  His trach was de cannulated. Patient remains nonverbal. In February he developed pneumonia worrisome for aspiration and was treated Unasyn. He failed trial of decannulation plan for him to have a ENT consult done to place an hour trach he had copious secretion requiring frequent suctioning as well as nasotracheal suctioning. Patient was finally  yesterday discharged from [facility] to [facility] 01/25/2020, but continued to be febrile. As soon as he presented to facility and was transferred to [facility] emergency department today.  The patient has been admitted to a telemetry bed. General surgery has been consulted and has taken the patient for operative I&D of the sacral wound on 01/28/2020. Cultures were taken. Infectious disease has also been consulted.   Consultants  . General Surgery  Procedures  . I & D of Stage IV sacral decubitus ulcer  Antibiotics   Anti-infectives (From admission, onward)   Start     Dose/Rate Route Frequency Ordered Stop   01/28/20 1730  piperacillin-tazobactam (ZOSYN) IVPB 3.375 g     3.375 g 12.5 mL/hr over 240 Minutes Intravenous Every 8 hours 01/28/20 1707     01/28/20 1200  vancomycin (VANCOREADY) IVPB 750 mg/150 mL  Status:  Discontinued     750 mg 150 mL/hr over 60 Minutes Intravenous Every 8 hours 01/28/20 0300 01/28/20 0823   01/28/20 1200  vancomycin (VANCOCIN) IVPB 1000 mg/200 mL premix     1,000 mg 200 mL/hr over 60 Minutes Intravenous Every 12 hours 01/28/20 0825     01/28/20 0800  ceFEPIme (MAXIPIME) 2 g in sodium chloride 0.9 % 100 mL IVPB  Status:  Discontinued     2 g 200 mL/hr over 30 Minutes Intravenous Every 8 hours 01/28/20 0240 01/28/20 1707   01/28/20 0245  vancomycin (VANCOCIN) IVPB 1000 mg/200 mL premix     1,000 mg 200 mL/hr over 60 Minutes Intravenous  Once 01/28/20 0241 01/28/20 0534   01/28/20 0200  metroNIDAZOLE (FLAGYL) IVPB 500 mg  Status:  Discontinued     500 mg 100 mL/hr over 60 Minutes Intravenous Every 8 hours 01/28/20 0108 01/28/20 1707   01/27/20  2230  vancomycin (VANCOCIN) IVPB 1000 mg/200 mL premix     1,000 mg 200 mL/hr over 60 Minutes Intravenous  Once 01/27/20 2221 01/28/20 0028   01/27/20 2230  ceFEPIme (MAXIPIME) 2 g in sodium chloride 0.9 % 100 mL IVPB     2 g 200 mL/hr over 30 Minutes Intravenous  Once 01/27/20 2221 01/27/20 2356     Subjective  The patient  is resting comfortably. He is non-verbal at baseline. His sister is at baseline. Prior to COVID infection, this patient lived alone and managed his own affairs.  Objective   Vitals:  Vitals:   01/29/20 1517 01/29/20 1526  BP:  124/63  Pulse:  72  Resp:  20  Temp:  98.2 F (36.8 C)  SpO2: 96% 100%   Exam:  Constitutional:  . The patient is awake. No acute distress. He speaks to his sister, but she cannot understand him. Respiratory:  . No increased work of breathing. . No wheezes, rales, or rhonchi . No tactile fremitus Cardiovascular:  . Regular rate and rhythm . No murmurs, ectopy, or gallups. . No lateral PMI. No thrills. Abdomen:  . Abdomen is soft, non-tender, non-distended . No hernias, masses, or organomegaly . Normoactive bowel sounds.  Musculoskeletal:  . No cyanosis, clubbing, or edema Skin:  . No rashes . Large sacral decubitus ulcer with necrotic tissue . palpation of skin: no induration or nodules Neurologic:  . CN 2-12 intact . Sensation all 4 extremities intact Psychiatric:  . Unable to evaluate as the patient is unable to cooperate with exam.  I have personally reviewed the following:   Today's Data  . Vitals, CBC, CMP  Micro Data  . Blood cultures x 2 - no growth . Wound cultures - pending  Imaging  . CT abdomen and pelvis: Sacral decubitus ulcer extending to bone with fluid collection in the central aspect of the gluteal musculature that is suspicious for abscess. Patchy and streaky opacities in the lung bases may represent pneumonia. . CXR Interstitial and ill-defined airspace opacities at the bilateral lung bases persis, but have improved as compared to CXR 01/21/2020.   Cardiology Data  . EKG: Repolarization abnormality vs severe global ischemia. New since 08/2019.  Scheduled Meds: . albuterol  2.5 mg Nebulization QID  . amiodarone  100 mg Per Tube BID  . benztropine  0.5 mg Per Tube BID  . carbidopa-levodopa  1 tablet Per Tube BID  .  diltiazem  30 mg Per Tube QID  . famotidine  20 mg Per Tube Daily  . [START ON 01/30/2020] feeding supplement (PRO-STAT SUGAR FREE 64)  30 mL Per Tube Daily  . free water  100 mL Per Tube Q4H  . hydrALAZINE  50 mg Per Tube BID  . insulin aspart  0-9 Units Subcutaneous Q4H  . metoprolol tartrate  12.5 mg Per Tube BID  . nutrition supplement (JUVEN)  1 packet Per Tube BID BM  . thiamine  200 mg Per Tube Daily   Continuous Infusions: . feeding supplement (JEVITY 1.5 CAL/FIBER) 1,000 mL (01/29/20 1545)  . piperacillin-tazobactam 3.375 g (01/29/20 0822)  . vancomycin 1,000 mg (01/29/20 1254)    Active Problems:   Hypertension   Diabetes ([place])   Chronic atrial fibrillation (HCC)   SIRS (systemic inflammatory response syndrome) ([place])   HCAP (healthcare-associated pneumonia)   Sepsis ([employer])   LOS: 1 day   A & P  . Sepsis ([place]) Due to sacral decubitus ulcer. In ED upon presentation the patient  demonstrated fever, tachycardia, tachypnea, and hypotension. WBC was elevated. Lactic acid was 1.7 and procalcitonin was 0.28. Initially this was felt to be due to pneumonia. However the patient had received treatment with Unasyn for pneumonia in February. CXR demonstrated persistent radiological evidence of pneumonia, but clinically the patient has no adventitious lung sounds, no cough, and he is saturating 98% on room air. However, CT does show evidence of Stage IV decubitus sacral ulcer with abscess. Possible bone involvement. He has gone for I & D with general surgery. He will likely need to go back on Tuesday. Patient had a very complex prolonged post Covid stay.  No discharge summary but per records apparently was discharged on Augmentin with Mid line in place no IV antibiotics were ordered at the time of discharge to SNF.  MId line does not appear to be infected and functional.   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 S/P I & D with general surgery. He will likely  need to go back on Tuesday. Will consult ID in the am. The patient is receiving IV Zosyn and Vancomycin. The patient has a history of shrapnel would not be a good candidate for MRI. CRP elevated at 9.7. ESR is 126.  Possible recurrent aspiration pneumonia versus HCAP: Speech pathology will be asked to evaluate the patient's swallow.  Patient status post PEG tube.   Hypertension: Chronic. Continue home medications.  Dysphagia: Pt has a PEG tube. Plans is for SLP swallow eval and nutrition consult. Will restart tube feeds.  Anemia: Likely due to chronic disease. Will check iron studies.   Chronic atrial fibrillation (HCC) - The patient has a CHADS2VASC score of >3. Resume eliquis when cleared by general surgery. Rate is well controlled on metoprolol and amiodarone which have been continued.   DM 2: Hb A1c is 4.2. Glucoses to be followed by FSBS and sensitive  SSI. Oral antihyperglycemics have been held. He will be on a carbohydrate controlled diet and hypoglycemic protocol.  Parkinson disease: Noted. Likely playing a part in patient's history of aspiration. Continue home medications.  Encephalopathy: Status post hypoxic respiratory arrest requiring intubation in the setting of Covid in December 2020.  Patient with some minimal improvements per family started to respond to his name at times but mainly remains nonverbal bedbound incontinent status post PEG tube.  Lurline Idol has been decannulated per LTAC notes if patient has significant secretions may need to replace tracheostomy.  At this point appears to be stable.  I have seen and examined this patient myself. I have spent 34 minutes in his evaluation and care.  DVT prophylaxis:  SCD resume eliquis if no surgical intervention Code Status:    DNR/DNI   as per family  I had personally discussed CODE STATUS with  Family  Family Communication:   Family not at  Bedside plan of care was discussed on the phone with sister by admitting  physician. Disposition Plan: Back to current facility when stable     Kyle Kai, DO [facility] Direct contact: see [email or web address]  7PM-7AM contact night coverage as above  01/29/2020, 4:00 PM  LOS: 0 days

## 2020-01-29 NOTE — Plan of Care (Signed)
  Problem: Education: Goal: Knowledge of General Education information will improve Description: Including pain rating scale, medication(s)/side effects and non-pharmacologic comfort measures Outcome: Not Progressing Note: Patient is non-verbal with multiple wounds, 1 wound vacuum, and now tube feedings. Will continue to monitor overall progression for the remainder of the shift. PT is recommending a SNF. Kyle [name] [facility]

## 2020-01-29 NOTE — H&P (View-Only) (Signed)
[facility] SURGICAL PROGRESS NOTE  Hospital Day(s): 1.   Post op day(s): 1 Day Post-Op.   Interval History: Patient seen and examined, no acute events or new complaints overnight. Patient is essentially non verbal and does not contribute to the history. He has a wound vac in place to sacral and left trochanter which is bridged anteriorly. No fevers. Hypokalemia - 3.3, Wbc - 7.2k, Hgb 7.2. Plan to return to the OR tomorrow for second look and vac change.   Vital signs in last 24 hours: [min-max] current  Temp:  [97.1 F (36.2 C)-98.6 F (37 C)] 98.6 F (37 C) (03/08 0419) Pulse Rate:  [59-72] 67 (03/08 0419) Resp:  [13-27] 18 (03/07 1720) BP: (103-161)/(45-109) 103/45 (03/08 0419) SpO2:  [92 %-100 %] 97 % (03/08 0419)     Height: 5\' 9"  (175.3 cm) Weight: 86.2 kg BMI (Calculated): 28.05   Intake/Output last 2 shifts:  03/07 0701 - 03/08 0700 In: 750 [I.V.:750] Out: 1100 [Urine:1050; Blood:50]   Physical Exam:  Constitutional: alert, cooperative and no distress  Respiratory: breathing non-labored at rest  Cardiovascular: regular rate and sinus rhythm  Integumentary: Wound vac to the sacrum and left trochanteric which are bridged together and then this is bridged further to the left anterior thigh, good seal.    Labs:  CBC Latest Ref Rng & Units 01/29/2020 01/28/2020 01/27/2020  WBC 4.0 - 10.5 K/uL 7.2 9.0 11.3(H)  Hemoglobin 13.0 - 17.0 g/dL 7.2(L) 7.4(L) 8.4(L)  Hematocrit 39.0 - 52.0 % 24.4(L) 25.0(L) 29.5(L)  Platelets 150 - 400 K/uL 394 393 446(H)   CMP Latest Ref Rng & Units 01/29/2020 01/28/2020 01/27/2020  Glucose 70 - 99 mg/dL 75 03/28/2020) 379(K)  BUN 8 - 23 mg/dL 240(X) 73(Z) 32(D)  Creatinine 0.61 - 1.24 mg/dL 92(E) 2.68(T) 4.19(Q)  Sodium 135 - 145 mmol/L 144 145 143  Potassium 3.5 - 5.1 mmol/L 3.3(L) 3.3(L) 3.7  Chloride 98 - 111 mmol/L 114(H) 108 107  CO2 22 - 32 mmol/L 24 26 27   Calcium 8.9 - 10.3 mg/dL 10.6(H) 11.6(H) 12.2(H)  Total Protein 6.5 - 8.1  g/dL 6.5 7.0 7.9  Total Bilirubin 0.3 - 1.2 mg/dL 0.5 0.5 0.5  Alkaline Phos 38 - 126 U/L 130(H) 145(H) 168(H)  AST 15 - 41 U/L 29 38 51(H)  ALT 0 - 44 U/L 14 65(H) 64(H)     Imaging studies: No new pertinent imaging studies   Assessment/Plan:  74 y.o. male 1 Day Post-Op s/p extensive sacral decubitus debridement, bone biopsy, and wound vac appliocation for infected sacral decubitus wound, complicated by multiple pertinent comorbidities.    - NPO after midnight, speech evaluation for swallowing  - Continue IV Abx (Vancomycin + Zosyn); follow up cultures and bon biopsy from OR  - Continue IVF  - Replete electrolytes  - Continue frequent positioning changes, offloading, low air loss mattress  - Will plan on return to OR tomorrow (03/09) for second look and wound vac change   - further management per primary team   All of the above findings and recommendations were discussed with the medical team  -- 62, PA-C [facility] 01/29/2020, 7:20 AM [phone] M-F: 7am - 4pm
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
## 2020-01-29 NOTE — Progress Notes (Signed)
Initial Nutrition Assessment  DOCUMENTATION CODES:   Not applicable  INTERVENTION:   Initiate Jevity 1.5 @ 39ml/hr + Prostat 85ml daily via tube   Free water flushes 114ml q4 hours   Regimen provides 2260kcal/day, 107g/day protein, 30g/day fiber, 1659ml/day free water  Juven Fruit Punch BID via tube, each serving provides 95kcal and 2.5g of protein (amino acids glutamine and arginine)  NUTRITION DIAGNOSIS:   Inadequate oral intake related to dysphagia as evidenced by NPO status, other (comment)(pt with PEG tube).  GOAL:   Patient will meet greater than or equal to 90% of their needs  MONITOR:   PO intake, Supplement acceptance, Labs, Weight trends, Skin, I & O's  REASON FOR ASSESSMENT:   Consult Enteral/tube feeding initiation and management  ASSESSMENT:   74 y.o. 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 Pt s/p I & D and VAC placement 3/7   Visited pt's room today, unable to speak with pt as pt is non verbal. Pt with PEG tube in place that was placed in January. Spoke with RN at [facility], pt's home tube feed regimen is Nepro 1.8 @45ml /hr + free water flushes 25ml q 6 hours. Regimen provides 1944kcal/day, 87g/day, 1829ml/day free water. Pt also receivers Juven TID. Per chart, it appears pt has lost 59lbs(24%) over the past 5 months; this is severe weight loss. RD unsure if weight loss has continued after PEG tube placement as there is no recent weight history in chart. RD will change pt's tube feed regimen to better meet his estimated needs.   Medications reviewed and include: pepcid,  insulin, juven, thiamine, zosyn, vancomycin   Labs reviewed: K 3.3(L), BUN 28(H), creat 0.47(L), Ca 10.6(H) adj. 12.12(H), P 3.3 wnl, Mg 2.3 wnl, alb 2.1(L) Hgb 7.2(L), Hct 24.4(L), MCV 111.4(H), MCHC 29.5(L)  NUTRITION - FOCUSED PHYSICAL EXAM:    Most Recent Value  Orbital Region  No depletion  Upper Arm Region  No depletion  Thoracic and Lumbar Region  No depletion  Buccal Region  No depletion  Temple Region  No depletion  Clavicle Bone Region  No depletion  Clavicle and Acromion Bone Region  No depletion  Scapular Bone Region  No depletion  Dorsal Hand  No depletion  Patellar Region  Moderate depletion  Anterior Thigh Region  Mild depletion  Posterior Calf Region  Moderate depletion  Edema (RD Assessment)  Mild  Hair  Reviewed  Eyes  Reviewed  Mouth  Reviewed  Skin  Reviewed  Nails  Reviewed     Diet Order:   Diet Order            Diet NPO time specified Except for: Sips with Meds  Diet effective now             EDUCATION NEEDS:   Not appropriate for education at this time  Skin:  Skin Assessment: Reviewed RN Assessment(sacral decubitus wound (15 cm x 15 cm) and infected left hip/leg decubitus wound (10 cm x 5 cm), and debridement of skin and subcutaneous tissue of infected left ischial decubitus wound (2 cm x 2 cm), for total area of 279 sqcm.)  Last BM:  3/7- type 7  Height:  Ht Readings from Last 1 Encounters:  01/27/20 5\' 9"  (1.753 m)    Weight:   Wt Readings from Last 1 Encounters:  01/27/20 86.2 kg    Ideal Body Weight:  72.7 kg  BMI:  Body mass index is 28.06 kg/m.  Estimated Nutritional Needs:   Kcal:  1900-2200kcal/day  Protein:  95-110g/day  Fluid:  >1.8L/day  03/28/20 MS, RD, LDN Contact information available in Amion

## 2020-01-29 NOTE — Progress Notes (Signed)
SLP Cancellation Note  Patient Details Name: Kyle [name] MRN: [id number] DOB: 1946-08-04   Cancelled treatment:       Reason Eval/Treat Not Completed: Patient not medically ready;Medical issues which prohibited therapy(chart reviewed thoroughly; met w/ pt/Sister this AM). Due to pt's extensive medical history since Jan. 2021(respiratory arrest Jan. 1, 2021 requirig tracheostomy and PEG tube placement; now decannulated) and to pt's NPO status since Jan. 1, 2021, suspect high likelihood of risk for dysphagia as well as aspiration.  A MBSS is recommended vs bedside evaluation in order to objectively assess oropharyngeal phase swallowing and the risk for aspiration. Discussed this w/ pt and Sister; Sister agreed. ST services will f/u w/ pt tomorrow for potential MBSS.  Recommend frequent oral care for hygiene and stimulation of swallowing; aspiration precautions. NSG updated.     Orinda Kenner, MS, CCC-SLP Olyn Landstrom 01/29/2020, 5:57 PM

## 2020-01-29 NOTE — Progress Notes (Signed)
Occupational Therapy Treatment Patient Details Name: Kyle [name] MRN: [id number] DOB: 1946/01/06 Today's Date: 01/29/2020    History of present illness Yoni Lobos is a 74 y.o. male with medical history significant of COVID 11/02/2019.  CVA, prediabetes, PE around 2018 started on Xarelto, chronic tremors of bilateral hands, bipolar disorder, morbid obesity CKD HLD neuropathy chronic atrial fibrillation DM2 COPD, Parkinson disease. He has been in LTAC and was discharged to SNF on 01/25/20. Was febrile when arrived to SNF and sent here to ED.   OT comments  Pt seen for OT tx this date to f/u re: ADL and ADL mobility performance, independence, and safety. Pt appears somewhat less lethargic this date versus information gleaned from OT evaluation documentation. Pt demos some increased visual attention and does appear to make some effort to engage in self care ADLs assessed (see ADL section), but still ultimately requires TOTAL assist. Pt engaged in bed mobility to laterally roll for linens to be changes and pt requiring MAX to TOTAL A +2 people. Pt appears to make some effort to each toward and hold rail when rolled onto his R side. Overall, pt requiring TOTAL A in most aspects of self care and ADL mobility. Anticipate SNF still most prudent d/c recommendation.   Follow Up Recommendations  SNF    Equipment Recommendations  Other (comment)(defer to next level of care)    Recommendations for Other Services      Precautions / Restrictions Precautions Precautions: Fall Restrictions Weight Bearing Restrictions: No       Mobility Bed Mobility Overal bed mobility: Needs Assistance Bed Mobility: Rolling Rolling: Max assist;+2 for physical assistance         General bed mobility comments: Pt with limited assistance with rolling, does with hand over hand guidance, hold bed rail when rolled onto his R side, difficult to assess whether grasp is primarily facilitated by increased  tone  Transfers                 General transfer comment: unsafe to attempt    Balance       Sitting balance - Comments: deferred                                   ADL either performed or assessed with clinical judgement   ADL Overall ADL's : Needs assistance/impaired                                       General ADL Comments: OT makes several attempts to engage pt in self care ADLs this session at bed level. Pt somewhat communicative this date with intermittent verbalizations including "be nice". Pt does make some attempt to engage when OT offers hand over hand assistance for oral care, UB dressing, and to wash hands with wash rag. Pt with some increased tone vs potential resistance of UEs noted throughout tasks and pt ultimately requring TOTAL A.     Vision Baseline Vision/History: Wears glasses Additional Comments: difficult to formally assess, pt with some incidence of tracking noted during session, but increased tone in neck/decreased ability to rotate coupled with some potential decreased cognitive status make true vision assesment difficult.   Perception     Praxis      Cognition Arousal/Alertness: Awake/alert Behavior During Therapy: Flat affect Overall Cognitive Status: Impaired/Different from baseline  General Comments: pt's sister present throughout session and reports that pt appears more attentive/awake this date.        Exercises Other Exercises Other Exercises: OT attempts to engage pt in hand squeezes and pt requires MAX to TOTAL A hand over hand for PROM to flex/ext digits of b/l hands with increased time required and limited arc of motion completed r/t increased tone.   Shoulder Instructions       General Comments      Pertinent Vitals/ Pain       Pain Assessment: No/denies pain(some vague grimacing with attempts to gently extend digits of L hand, but pt does answer  "no" when asked if painful.)  Home Living                                          Prior Functioning/Environment              Frequency  Min 1X/week        Progress Toward Goals  OT Goals(current goals can now be found in the care plan section)  Progress towards OT goals: OT to reassess next treatment  Acute Rehab OT Goals Patient Stated Goal: none stated OT Goal Formulation: Patient unable to participate in goal setting Time For Goal Achievement: 02/11/20 Potential to Achieve Goals: [place] Discharge plan remains appropriate;Frequency remains appropriate    Co-evaluation    PT/OT/SLP Co-Evaluation/Treatment: Yes Reason for Co-Treatment: Complexity of the patient's impairments (multi-system involvement);For patient/therapist safety PT goals addressed during session: Mobility/safety with mobility OT goals addressed during session: ADL's and self-care      AM-PAC OT "6 Clicks" Daily Activity     Outcome Measure   Help from another person eating meals?: Total Help from another person taking care of personal grooming?: Total Help from another person toileting, which includes using toliet, bedpan, or urinal?: Total Help from another person bathing (including washing, rinsing, drying)?: Total Help from another person to put on and taking off regular upper body clothing?: Total Help from another person to put on and taking off regular lower body clothing?: Total 6 Click Score: 6    End of Session    OT Visit Diagnosis: Other abnormalities of gait and mobility (R26.89);Muscle weakness (generalized) (M62.81)   Activity Tolerance Other (comment)(limited by hypertonicity, potentially by cognitive status)   Patient Left in bed;with call bell/phone within reach;with bed alarm set;with family/visitor present(pt's sister present throughout)   Nurse Communication          Time: 6269-4854 OT Time Calculation (min): 38 min  Charges: OT General  Charges $OT Visit: 1 Visit OT Treatments $Self Care/Home Management : 8-22 mins $Therapeutic Activity: 8-22 mins  Gerrianne Scale, [place], OTR/L ascom [phone] 01/29/20, 2:44 PM

## 2020-01-29 NOTE — Progress Notes (Signed)
Pharmacy Antibiotic Note  Kyle [name] is a 74 y.o. male admitted on 01/27/2020 with sepsis. Patient originally presented from nursing home due to concern for fever and was septic in ED. Exam notable for infected sacral decubitus ulcer now status post irrigation and debridement on 3/7. Pharmacy has been consulted for vancomycin dosing for infected sacral decubitus ulcer. Patient is also on pip/tazo.   Patient is scheduled to return to OR 3/9 for second look and wound vac change. Wound cultures from soft tissue significant for gram positive cocci in pairs and few gram variable rods to date.   Plan: Vancomycin 1000 mg IV Q 12 hrs. Goal AUC 400-550. Expected AUC: 437 SCr used: 0.8 Css: 12.2   Scr stable so far this admission. Monitor for nephrotoxicity with vancomycin and pip/tazo combination. Daily Scr per protocol. Levels at steady state.   Height: 5\' 9"  (175.3 cm) Weight: 190 lb (86.2 kg) IBW/kg (Calculated) : 70.7  Temp (24hrs), Avg:98 F (36.7 C), Min:97.1 F (36.2 C), Max:99 F (37.2 C)  Recent Labs  Lab 01/23/20 0612 01/27/20 2242 01/27/20 2323 01/28/20 0419 01/29/20 0319  WBC  --  11.3*  --  9.0 7.2  CREATININE 0.49* 0.51*  --  0.43* 0.47*  LATICACIDVEN  --   --  1.7  --   --     Estimated Creatinine Clearance: 90.8 mL/min (A) (by C-G formula based on SCr of 0.47 mg/dL (L)).    No Known Allergies  Antimicrobials this admission: Cefepime 3/6 >> 3/7 Metronidazole 3/7 >> 3/7 Vancomycin 3/6 >>  Pip/tazo 3/7 >>   Microbiology results: 3/7 Wound Cx (bone biopsy): No organisms seen 3/7 Wound Cx (fluid): No growth < 24h 3/7 Wound Cx (soft tissue): Few GPC in pairs, few gram variable rod 3/7 Wound Cx (soft tissue): No growth <24h 3/6 BCx: NG x 2 days   Thank you for allowing pharmacy to be a part of this patient's care.  5/7 Pharmacy Resident 01/29/2020 12:21 PM

## 2020-01-29 NOTE — Consult Note (Addendum)
WOC consult requested for sacrum wound prior to surgical team involvement.  They are now following for assessment and plan of care; pt had debridement in the OR yesterday and a Vac dressing was applied to the location.  Discussed plan of care with surgical PA Zack, via secure chat.  They will take the patient back to the OR for the next Vac dressing change and WOC assistance is not requested at this time. Please refer to surgical team for further questions.  Please re-consult if further assistance is needed.   Thank-you,  Cammie Mcgee MSN, RN, CWOCN, [place], CNS [phone]

## 2020-01-30 ENCOUNTER — Encounter: Payer: Self-pay | Admitting: Internal Medicine

## 2020-01-30 ENCOUNTER — Encounter
Admission: EM | Disposition: A | Discharge status: Skilled Nursing Facility | Payer: Self-pay | Source: Home / Self Care | Attending: Internal Medicine

## 2020-01-30 ENCOUNTER — Inpatient Hospital Stay: Payer: Medicare Other | Admitting: Anesthesiology

## 2020-01-30 DIAGNOSIS — L89224 Pressure ulcer of left hip, stage 4: Secondary | ICD-10-CM | POA: Diagnosis present

## 2020-01-30 HISTORY — PX: APPLICATION OF WOUND VAC: SHX5189

## 2020-01-30 HISTORY — PX: INCISION AND DRAINAGE OF WOUND: SHX1803

## 2020-01-30 LAB — BASIC METABOLIC PANEL
Anion gap: 7 (ref 5–15)
BUN: 25 mg/dL — ABNORMAL HIGH (ref 8–23)
CO2: 27 mmol/L (ref 22–32)
Calcium: 11.1 mg/dL — ABNORMAL HIGH (ref 8.9–10.3)
Chloride: 113 mmol/L — ABNORMAL HIGH (ref 98–111)
Creatinine, Ser: 0.49 mg/dL — ABNORMAL LOW (ref 0.61–1.24)
GFR calc Af Amer: >60 mL/min (ref >60)
GFR calc non Af Amer: >60 mL/min (ref >60)
Glucose, Bld: 89 mg/dL (ref 70–99)
Potassium: 3.3 mmol/L — ABNORMAL LOW (ref 3.5–5.1)
Sodium: 147 mmol/L — ABNORMAL HIGH (ref 135–145)

## 2020-01-30 LAB — CBC
HCT: 24.4 % — ABNORMAL LOW (ref 39.0–52.0)
Hemoglobin: 7.2 g/dL — ABNORMAL LOW (ref 13.0–17.0)
MCH: 33.2 pg (ref 26.0–34.0)
MCHC: 29.5 g/dL — ABNORMAL LOW (ref 30.0–36.0)
MCV: 112.4 fL — ABNORMAL HIGH (ref 80.0–100.0)
Platelets: 434 10*3/uL — ABNORMAL HIGH (ref 150–400)
RBC: 2.17 MIL/uL — ABNORMAL LOW (ref 4.22–5.81)
RDW: 17.2 % — ABNORMAL HIGH (ref 11.5–15.5)
WBC: 8.5 10*3/uL (ref 4.0–10.5)
nRBC: 0 % (ref 0.0–0.2)

## 2020-01-30 LAB — GLUCOSE, CAPILLARY
Glucose-Capillary: 103 mg/dL — ABNORMAL HIGH (ref 70–99)
Glucose-Capillary: 124 mg/dL — ABNORMAL HIGH (ref 70–99)
Glucose-Capillary: 75 mg/dL (ref 70–99)
Glucose-Capillary: 78 mg/dL (ref 70–99)
Glucose-Capillary: 82 mg/dL (ref 70–99)
Glucose-Capillary: 83 mg/dL (ref 70–99)

## 2020-01-30 LAB — LEGIONELLA PNEUMOPHILA SEROGP 1 UR AG: L. pneumophila Serogp 1 Ur Ag: NEGATIVE

## 2020-01-30 SURGERY — APPLICATION, WOUND VAC
Anesthesia: General | Laterality: Left

## 2020-01-30 MED ORDER — DEXMEDETOMIDINE HCL 200 MCG/2ML IV SOLN
INTRAVENOUS | Status: DC | PRN
  Administered 2020-01-30 (×2): 4 ug via INTRAVENOUS

## 2020-01-30 MED ORDER — PROPOFOL 500 MG/50ML IV EMUL
INTRAVENOUS | Status: DC | PRN
  Administered 2020-01-30: 65 ug/kg/min via INTRAVENOUS

## 2020-01-30 MED ORDER — SODIUM CHLORIDE 0.9 % IV SOLN: INTRAVENOUS | Status: DC | PRN

## 2020-01-30 MED ORDER — EPHEDRINE SULFATE 50 MG/ML IJ SOLN
INTRAMUSCULAR | Status: DC | PRN
  Administered 2020-01-30 (×2): 10 mg via INTRAVENOUS
  Administered 2020-01-30: 5 mg via INTRAVENOUS
  Administered 2020-01-30: 10 mg via INTRAVENOUS

## 2020-01-30 MED ORDER — PROPOFOL 10 MG/ML IV BOLUS
INTRAVENOUS | Status: AC
  Filled 2020-01-30: qty 40

## 2020-01-30 MED ORDER — FENTANYL CITRATE (PF) 100 MCG/2ML IJ SOLN
25.0000 ug | INTRAMUSCULAR | Status: DC | PRN
  Administered 2020-01-30: 25 ug via INTRAVENOUS

## 2020-01-30 MED ORDER — PROPOFOL 10 MG/ML IV BOLUS
INTRAVENOUS | Status: DC | PRN
  Administered 2020-01-30: 20 mg via INTRAVENOUS

## 2020-01-30 MED ORDER — POTASSIUM CHLORIDE 20 MEQ PO PACK
40.0000 meq | PACK | Freq: Once | ORAL | Status: AC
  Administered 2020-01-30: 40 meq via ORAL
  Filled 2020-01-30: qty 2

## 2020-01-30 MED ORDER — ONDANSETRON HCL 4 MG/2ML IJ SOLN: 4.0000 mg | Freq: Once | INTRAMUSCULAR | Status: DC | PRN

## 2020-01-30 MED ORDER — LIDOCAINE HCL (CARDIAC) PF 100 MG/5ML IV SOSY
PREFILLED_SYRINGE | INTRAVENOUS | Status: DC | PRN
  Administered 2020-01-30: 80 mg via INTRAVENOUS

## 2020-01-30 MED ORDER — IPRATROPIUM-ALBUTEROL 0.5-2.5 (3) MG/3ML IN SOLN
3.0000 mL | Freq: Three times a day (TID) | RESPIRATORY_TRACT | Status: DC
  Administered 2020-01-30 – 2020-02-08 (×25): 3 mL via RESPIRATORY_TRACT
  Filled 2020-01-30 (×26): qty 3

## 2020-01-30 MED ORDER — ALBUTEROL SULFATE (2.5 MG/3ML) 0.083% IN NEBU
2.5000 mg | INHALATION_SOLUTION | RESPIRATORY_TRACT | Status: DC | PRN
  Filled 2020-01-30: qty 3

## 2020-01-30 MED ORDER — FENTANYL CITRATE (PF) 100 MCG/2ML IJ SOLN
INTRAMUSCULAR | Status: AC
  Administered 2020-01-30: 25 ug via INTRAVENOUS
  Filled 2020-01-30: qty 2

## 2020-01-30 MED ORDER — FENTANYL CITRATE (PF) 100 MCG/2ML IJ SOLN
INTRAMUSCULAR | Status: AC
  Filled 2020-01-30: qty 2

## 2020-01-30 MED ORDER — PHENYLEPHRINE HCL (PRESSORS) 10 MG/ML IV SOLN
INTRAVENOUS | Status: DC | PRN
  Administered 2020-01-30: 100 ug via INTRAVENOUS
  Administered 2020-01-30: 150 ug via INTRAVENOUS
  Administered 2020-01-30 (×3): 100 ug via INTRAVENOUS

## 2020-01-30 MED ORDER — POTASSIUM CHLORIDE CRYS ER 20 MEQ PO TBCR: 40.0000 meq | EXTENDED_RELEASE_TABLET | Freq: Once | ORAL | Status: DC

## 2020-01-30 SURGICAL SUPPLY — 35 items
BLADE SURG 15 STRL LF DISP TIS (BLADE) ×1 IMPLANT
BLADE SURG 15 STRL SS (BLADE) ×2
BRIEF STRETCH MATERNITY 2XLG (MISCELLANEOUS) ×2 IMPLANT
CANISTER SUCT 1200ML W/VALVE (MISCELLANEOUS) ×2 IMPLANT
CANISTER WOUND CARE 500ML ATS (WOUND CARE) ×2 IMPLANT
CONNECTOR Y WND VAC (MISCELLANEOUS) IMPLANT
COVER WAND RF STERILE (DRAPES) ×2 IMPLANT
DRAPE LAPAROTOMY 100X77 ABD (DRAPES) ×2 IMPLANT
DRAPE LAPAROTOMY 77X122 PED (DRAPES) ×2 IMPLANT
DRSG VAC ATS MED SENSATRAC (GAUZE/BANDAGES/DRESSINGS) ×1 IMPLANT
ELECT CAUTERY BLADE TIP 2.5 (TIP) ×2
ELECT REM PT RETURN 9FT ADLT (ELECTROSURGICAL) ×2
ELECTRODE CAUTERY BLDE TIP 2.5 (TIP) ×1 IMPLANT
ELECTRODE REM PT RTRN 9FT ADLT (ELECTROSURGICAL) ×1 IMPLANT
GAUZE PACKING IODOFORM 1X5 (MISCELLANEOUS) IMPLANT
GLOVE BIO SURGEON STRL SZ 6.5 (GLOVE) ×2 IMPLANT
GLOVE INDICATOR 7.0 STRL GRN (GLOVE) ×4 IMPLANT
GOWN STRL REUS W/ TWL LRG LVL3 (GOWN DISPOSABLE) ×2 IMPLANT
GOWN STRL REUS W/TWL LRG LVL3 (GOWN DISPOSABLE) ×4
KIT DRSG VAC SLVR GRANUFM (MISCELLANEOUS) ×2 IMPLANT
KIT TURNOVER KIT A (KITS) ×2 IMPLANT
NS IRRIG 1000ML POUR BTL (IV SOLUTION) ×2 IMPLANT
NS IRRIG 500ML POUR BTL (IV SOLUTION) ×2 IMPLANT
PACK BASIN MINOR ARMC (MISCELLANEOUS) ×2 IMPLANT
PULSAVAC PLUS IRRIG FAN TIP (DISPOSABLE) ×2
SOL .9 NS 3000ML IRR  AL (IV SOLUTION) ×2
SOL .9 NS 3000ML IRR AL (IV SOLUTION) ×1
SOL .9 NS 3000ML IRR UROMATIC (IV SOLUTION) ×1 IMPLANT
SOL PREP PVP 2OZ (MISCELLANEOUS) ×2
SOLUTION PREP PVP 2OZ (MISCELLANEOUS) ×1 IMPLANT
SPONGE LAP 18X18 RF (DISPOSABLE) ×2 IMPLANT
SWAB DUAL CULTURE TRANS RED ST (MISCELLANEOUS) ×2 IMPLANT
SYR BULB IRRIG 60ML STRL (SYRINGE) ×2 IMPLANT
TIP FAN IRRIG PULSAVAC PLUS (DISPOSABLE) IMPLANT
WND VAC CONN Y (MISCELLANEOUS) ×1

## 2020-01-30 NOTE — Progress Notes (Signed)
[place] INFECTIOUS DISEASE PROGRESS NOTE Date of Admission:  01/27/2020     ID: Kyle [name] is a 74 y.o. 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 Wound relatively clean per op note. No fevers.  ROS  Eleven systems are reviewed and negative except per hpi  Medications:  Antibiotics Given (last 72 hours)    Date/Time Action Medication Dose Rate   01/27/20 2324 New Bag/Given   ceFEPIme (MAXIPIME) 2 g in sodium chloride 0.9 % 100 mL IVPB 2 g 200 mL/hr   01/27/20 2325 New Bag/Given   vancomycin (VANCOCIN) IVPB 1000 mg/200 mL premix 1,000 mg 200 mL/hr   01/28/20 0310 New Bag/Given   metroNIDAZOLE (FLAGYL) IVPB 500 mg 500 mg 100 mL/hr   01/28/20 0416 New Bag/Given   vancomycin (VANCOCIN) IVPB 1000 mg/200 mL premix 1,000 mg 200 mL/hr   01/28/20 0750 New Bag/Given   ceFEPIme (MAXIPIME) 2 g in sodium chloride 0.9 % 100 mL IVPB 2 g 200 mL/hr   01/28/20 0917 New Bag/Given   metroNIDAZOLE (FLAGYL) IVPB 500 mg 500 mg 100 mL/hr   01/28/20 1813 New Bag/Given   piperacillin-tazobactam (ZOSYN) IVPB 3.375 g 3.375 g 12.5 mL/hr   01/29/20 0008 New Bag/Given   vancomycin (VANCOCIN) IVPB 1000 mg/200 mL premix 1,000 mg 200 mL/hr   01/29/20 0112 New Bag/Given   piperacillin-tazobactam (ZOSYN) IVPB 3.375 g 3.375 g 12.5 mL/hr   01/29/20 3419 New Bag/Given   piperacillin-tazobactam (ZOSYN) IVPB 3.375 g 3.375 g 12.5 mL/hr   01/29/20 1254 New Bag/Given   vancomycin (VANCOCIN) IVPB 1000 mg/200 mL premix 1,000 mg 200 mL/hr   01/29/20 1557 New Bag/Given   piperacillin-tazobactam (ZOSYN) IVPB 3.375 g 3.375 g 12.5 mL/hr   01/30/20 0005 New Bag/Given   vancomycin (VANCOCIN) IVPB 1000  mg/200 mL premix 1,000 mg 200 mL/hr   01/30/20 0107 New Bag/Given   piperacillin-tazobactam (ZOSYN) IVPB 3.375 g 3.375 g 12.5 mL/hr   01/30/20 1004 New Bag/Given   piperacillin-tazobactam (ZOSYN) IVPB 3.375 g 3.375 g 12.5 mL/hr     . amiodarone  100 mg Per Tube BID  . benztropine  0.5 mg Per Tube BID  . carbidopa-levodopa  1 tablet Per Tube BID  . diltiazem  30 mg Per Tube QID  . famotidine  20 mg Per Tube Daily  . feeding supplement (PRO-STAT SUGAR FREE 64)  30 mL Per Tube Daily  . free water  100 mL Per Tube Q4H  . hydrALAZINE  50 mg Per Tube BID  . insulin aspart  0-9 Units Subcutaneous Q4H  . ipratropium-albuterol  3 mL Nebulization TID  . metoprolol tartrate  12.5 mg Per Tube BID  . nutrition supplement (JUVEN)  1 packet Per Tube BID BM  . thiamine  200 mg Per Tube Daily    Objective: Vital signs in last 24 hours: Temp:  [98 F (36.7 C)-99.9 F (37.7 C)] 98.3 F (36.8 C) (03/09 1251) Pulse Rate:  [64-85] 69 (03/09 1251) Resp:  [16-29] 25 (03/09 1239) BP: (106-163)/(51-68) 111/54 (03/09 1251) SpO2:  [94 %-100 %] 95 % (03/09 1251) Constitutional: He is awake,but minimally interactive  HENT: anicetric  Mouth/Throat: Oropharynx is clear and dry. No oropharyngeal exudate.  Cardiovascular: Normal  rate, regular rhythm and normal heart sounds. Pulmonary/Chest: Effort normal and breath sounds normal.   Abdominal: Soft. Bowel sounds are normal. He exhibits no distension. PEG Tube in place Lymphadenopathy: He has no cervical adenopathy.  Neurological: confused Skin: wound vac in place  Psychiatric: unable to assess  Lab Results Recent Labs    01/29/20 0319 01/30/20 0520  WBC 7.2 8.5  HGB 7.2* 7.2*  HCT 24.4* 24.4*  NA 144 147*  K 3.3* 3.3*  CL 114* 113*  CO2 24 27  BUN 28* 25*  CREATININE 0.47* 0.49*    Microbiology: Results for orders placed or performed during the hospital encounter of 01/27/20  Blood Culture (routine x 2)     Status: None (Preliminary result)    Collection Time: 01/27/20 10:42 PM   Specimen: BLOOD  Result Value Ref Range Status   Specimen Description BLOOD BLOOD LEFT HAND  Final   Special Requests   Final    BOTTLES DRAWN AEROBIC AND ANAEROBIC Blood Culture adequate volume   Culture   Final    NO GROWTH 3 DAYS Performed at [facility], [address] [place], [place] [address]    Report Status PENDING  Incomplete  Blood Culture (routine x 2)     Status: None (Preliminary result)   Collection Time: 01/27/20 10:42 PM   Specimen: BLOOD  Result Value Ref Range Status   Specimen Description BLOOD BLOOD LEFT HAND  Final   Special Requests   Final    BOTTLES DRAWN AEROBIC AND ANAEROBIC Blood Culture adequate volume   Culture   Final    NO GROWTH 3 DAYS Performed at [facility], [address], [place], [place] [address]    Report Status PENDING  Incomplete  Respiratory Panel by RT PCR (Flu A&B, Covid) - Nasopharyngeal Swab     Status: None   Collection Time: 01/27/20 11:57 PM   Specimen: Nasopharyngeal Swab  Result Value Ref Range Status   SARS Coronavirus 2 by RT PCR NEGATIVE NEGATIVE Final    Comment: (NOTE) SARS-CoV-2 target nucleic acids are NOT DETECTED. The SARS-CoV-2 RNA is generally detectable in upper respiratoy specimens during the acute phase of infection. The lowest concentration of SARS-CoV-2 viral copies this assay can detect is 131 copies/mL. A negative result does not preclude SARS-Cov-2 infection and should not be used as the sole basis for treatment or other patient management decisions. A negative result may occur with  improper specimen collection/handling, submission of specimen other than nasopharyngeal swab, presence of viral mutation(s) within the areas targeted by this assay, and inadequate number of viral copies (<131 copies/mL). A negative result must be combined with clinical observations, patient history, and epidemiological information. The expected result is  Negative. Fact Sheet for Patients:  PinkCheek.be Fact Sheet for Healthcare Providers:  GravelBags.it This test is not yet ap proved or cleared by the [place] FDA and  has been authorized for detection and/or diagnosis of SARS-CoV-2 by FDA under an Emergency Use Authorization (EUA). This EUA will remain  in effect (meaning this test can be used) for the duration of the COVID-19 declaration under Section 564(b)(1) of the Act, 21 U.S.C. section 360bbb-3(b)(1), unless the authorization is terminated or revoked sooner.    Influenza A by PCR NEGATIVE NEGATIVE Final   Influenza B by PCR NEGATIVE NEGATIVE Final    Comment: (NOTE) The Xpert Xpress SARS-CoV-2/FLU/RSV assay is intended as an aid in  the diagnosis of influenza from Nasopharyngeal swab specimens and  should  not be used as a sole basis for treatment. Nasal washings and  aspirates are unacceptable for Xpert Xpress SARS-CoV-2/FLU/RSV  testing. Fact Sheet for Patients: PinkCheek.be Fact Sheet for Healthcare Providers: GravelBags.it This test is not yet approved or cleared by the [place] FDA and  has been authorized for detection and/or diagnosis of SARS-CoV-2 by  FDA under an Emergency Use Authorization (EUA). This EUA will remain  in effect (meaning this test can be used) for the duration of the  Covid-19 declaration under Section 564(b)(1) of the Act, 21  U.S.C. section 360bbb-3(b)(1), unless the authorization is  terminated or revoked. Performed at [facility], [address] [place], [place] [address]   Aerobic/Anaerobic Culture (surgical/deep wound)     Status: None (Preliminary result)   Collection Time: 01/28/20  1:06 PM   Specimen: PATH Soft tissue  Result Value Ref Range Status   Specimen Description   Final    TISSUE Performed at [facility], [address],  [place], [place] [address]    Special Requests   Final    NONE Performed at [facility], [address] [place], [place] [address]    Gram Stain   Final    RARE WBC PRESENT,BOTH PMN AND MONONUCLEAR NO ORGANISMS SEEN    Culture   Final    NO GROWTH 2 DAYS Performed at [facility], [place] [address], [place], [place] [address]    Report Status PENDING  Incomplete  Aerobic/Anaerobic Culture (surgical/deep wound)     Status: None (Preliminary result)   Collection Time: 01/28/20  1:54 PM   Specimen: Wound; Body Fluid  Result Value Ref Range Status   Specimen Description   Final    WOUND Performed at [facility], [address] [place], [place] [address]    Special Requests   Final    NONE Performed at [facility], [address] [place], [place] [address]    Gram Stain   Final    FEW WBC PRESENT, PREDOMINANTLY PMN NO ORGANISMS SEEN    Culture   Final    NO GROWTH 2 DAYS Performed at [facility] [address], [place], [place] [address]    Report Status PENDING  Incomplete  Aerobic/Anaerobic Culture (surgical/deep wound)     Status: None (Preliminary result)   Collection Time: 01/28/20  1:55 PM   Specimen: PATH Soft tissue  Result Value Ref Range Status   Specimen Description   Final    TISSUE Performed at [facility], [address], [place], [place] [address]    Special Requests   Final    NONE Performed at [facility], [address] [place], [place] [address]    Gram Stain   Final    RARE WBC PRESENT, PREDOMINANTLY PMN FEW GRAM POSITIVE COCCI IN PAIRS FEW GRAM VARIABLE ROD    Culture   Final    ABUNDANT ENTEROCOCCUS FAECIUM SUSCEPTIBILITIES TO FOLLOW HOLDING FOR POSSIBLE ANAEROBE Performed at [facility], [place] [address] [place], [place] [address]    Report Status PENDING  Incomplete  Aerobic/Anaerobic Culture (surgical/deep wound)     Status: None (Preliminary result)   Collection  Time: 01/28/20  2:17 PM   Specimen: PATH Bone biopsy; Tissue  Result Value Ref Range Status   Specimen Description   Final    BIOPSY Performed at [facility], [address] [place], [place] [address]    Special Requests  Final    NONE Performed at [facility], [address] [place], [place] [address]    Gram Stain   Final    NO WBC SEEN NO ORGANISMS SEEN Performed at [facility], [place] [address] [place], [place] [address]    Culture   Final    RARE ENTEROCOCCUS FAECIUM NO ANAEROBES ISOLATED; CULTURE IN PROGRESS FOR 5 DAYS    Report Status PENDING  Incomplete    Studies/Results: No results found.  Assessment/Plan: Kyle [name] is a 74 y.o. male admitted March 6 with a fever from the nursing home.  He has a very complicated medical history.  He suffered Covid pneumonia in December and had a prolonged hospitalization including tracheostomy and PEG tube placement.  He was at a long-term acute care facility and recently discharged to a skilled nursing facility.  He was sent to the hospital due to fever and found to have a large infected appearing decubitus ulcer.  CT scan showed the sacral decub which extended to the bone with a fluid collection suspicious for an abscess.  On admission temperature was 101.8 white count was 11.3.  He was taken to the operating room March 7 by Dr. Hampton Abbot.  Extensive debridement was done of the sacral wound as well as the infected left hip and leg ulcer and the left ischial wound.  There was a sacral bone biopsy was done.  Wound VAC was placed.He is unable to provide me a history.  He has extensive wounds down to bone. These are unlikely to heal unless his mobility status improves and nutritional status improves (alb 2.0) which seems unlikely at point. ESR 126, crp 9.6.   3/9 - s/p repeat surgery and site remains clean I would strongly suggest palliative care for goals of care.  Rec Would continue vanco and zosyn pending  cx results.  If aggressive care is warranted we will base abx recs on culture results.  Cultures with enterococcus and possible anaerobe.  Thank you very much for the consult. Will follow with you.  Leonel Ramsay   01/30/2020, 1:51 PM

## 2020-01-30 NOTE — Op Note (Signed)
Operative Note  Preoperative Diagnosis: Pressure injury of sacrum and left trochanter  Postoperative Diagnosis: Same. Wound measurements as follows: Left trochanter was 10.5 x 7.5 x 3 cm and the sacral wound was 16 x 12.5 x 5 cm. I also debrided a small area on the sacral wound that was 2.5 x 2 x 1 cm  Operation: Debridement of sacral pressure ulcer and placement of wound VAC greater than 50 cm  Surgeon: [name], MD  Assistant: None  Anesthesia: Monitored anesthesia care  Findings: There was a small area of dusky skin at about the 2 o'clock position of the sacral wound. This was debrided for a total area of 2.5 x 2 x 1 cm. This was entirely suprafascial. The remainder of the wound appeared free of necrosis or purulent drainage.  Indications: This is a 74 year old man who has had a fairly unfortunate sequence of events that have resulted in significant immobility. He was brought to [facility] over the weekend and underwent debridement of a sacral and left trochanter pressure ulcer. Wound VAC was placed and was due to be changed today. Due to the extensive necrosis seen at his initial operation, it was felt that this should be performed in the operating room, in case additional debridement was necessary. Consent was obtained from the patient's sister, as the patient is currently unable to provide his own informed consent.  Procedure In Detail: The patient was brought to the operating room and placed supine on the OR table. After he was sedated with intravenous medications, he was turned into a right lateral decubitus position. Care was taken to pad all bony prominences appropriately and an axillary roll was placed. His left arm was supported on pillows and his right arm on the arm board. His body was supported with 2 large gel rolls and he was securely strapped in place. The old wound VAC was removed and discarded. He was sterilely prepped and draped in standard  fashion. A timeout was performed confirming the patient's identity, the procedure being performed, his allergies, all necessary equipment was available, and that he was adequately sedated. He is receiving antibiotics on the floor, therefore no perioperative prophylaxis was administered.  Both wounds were thoroughly inspected. The left trochanter wound was clean without any additional necrosis or questionable areas that would require debridement. There was a small area at the 2 o'clock position on the sacral wound that appeared questionable and therefore it was debrided using a combination of electrocautery and sharp dissection. After that, there were no additional concerns and the wound VAC was replaced using bridging technique to avoid the patient lying on the disc. Good suction was achieved. The patient was then turned supine and transferred to his hospital bed. He was aroused from his sedation and taken to the postanesthesia care unit in stable condition.  EBL: Less than 1 cc  IVF: See anesthesia record  Specimen(s): None  Complications: none immediately apparent.   Counts: all needles, instruments, and sponges were counted and reported to be correct in number at the end of the case.   I was present for and participated in the entire operation.  [name] 12:25 PM

## 2020-01-30 NOTE — Progress Notes (Signed)
SLP Cancellation Note  Patient Details Name: Kyle [name] MRN: [id number] DOB: 08-22-1946   Cancelled treatment:       Reason Eval/Treat Not Completed: Patient at procedure or test/unavailable(pt is currently NPO for procedure. ST will f/u. )     [name], MS, CCC-SLP [facility] 01/30/2020, 10:13 AM

## 2020-01-30 NOTE — Progress Notes (Signed)
PROGRESS NOTE  Kyle [name] NLZ:[id number] DOB: 06-28-1946 DOA: 01/27/2020 PCP: [facility]  Brief History   Kyle [name] is a 74 y.o. male with medical history significant of COVID 11/02/2019. CVA, prediabetes, PE around 2018 started on Xarelto, chronic tremors of bilateral hands, bipolar disorder, morbid obesity, CKD, HLD, neuropathy, chronic atrial fibrillation, DM2, COPD, and Parkinson disease. He presented with fever from SNF. He had been discharged from SNF after a 10 day inpatient stay for acute hypoxemic respiratory failure due to COVID-19. During this stay the patient was treated with Remdesivir, decadron, Rocephin and azithromycin.   In December patient was admitted for Covid with acute respiratory hypoxemic failure treated remdesivir 10 days of Decadron and Rocephin and azithromycin for possible bacterial superinfection in [place]. This was followed by a roughly three week stay at [facility]. This was followed by a three day stay at [facility].   His hospital stay in [place] was complicated by tachycardia patient was seen by cardiology started Cardizem metoprolol. The patient suffered respiratory arrest on 24 November 2019 and had to be intubated. He had an EEG showing slowing consistent with mild residual encephalopathy patient required tracheostomy and PEG tube placement. He later on developed pressure sacral decubitus ulcer which required debridement.He required broad-spectrum antibiotics IV vancomycin and Zosyn. He then transferred to [facility] in [place]. His antibiotics were later simplified to cefepime.  His trach was de cannulated. Patient remains nonverbal. In February he developed pneumonia worrisome for aspiration and was treated Unasyn. He failed trial of decannulation plan for him to have a ENT consult done to place an hour trach he had copious secretion requiring frequent suctioning as well as nasotracheal suctioning. Patient was finally  yesterday discharged from [facility] to [facility] 01/25/2020, but continued to be febrile. As soon as he presented to facility and was transferred to [facility] emergency department today.  The patient was admitted to a telemetry bed. General surgery and Infectious disease were consulted, and the patient went for operative I & D on 01/28/2020. Cultures were taken. He is receiving IV   The patient has been admitted to a telemetry bed. General surgery has been consulted and has taken the patient for operative I&D of the sacral wound on 01/28/2020. Cultures were taken. Infectious disease has also been consulted. The patient is receiving IV Zosyn and Vancomycin. Nutrition has been consulted and the patient has been restarted on tube feeds. SLP has been consulted for swallow eval, although this has not yet taken place as the patient has been in procedures. Cultures have grown enterococcus and possible anaerobes. Infectious disease feels that it will be unlikely that these wounds will heal given the patient's poor nutritional and mobility status. He has recommended palliative care consult. He has returned to the OR again today for further debridement.  Consultants  . General Surgery . Infectious disease  Procedures  . I & D of Stage IV sacral decubitus ulcer  Antibiotics   Anti-infectives (From admission, onward)   Start     Dose/Rate Route Frequency Ordered Stop   01/28/20 1730  piperacillin-tazobactam (ZOSYN) IVPB 3.375 g     3.375 g 12.5 mL/hr over 240 Minutes Intravenous Every 8 hours 01/28/20 1707     01/28/20 1200  vancomycin (VANCOREADY) IVPB 750 mg/150 mL  Status:  Discontinued     750 mg 150 mL/hr over 60 Minutes Intravenous Every 8 hours 01/28/20 0300 01/28/20 0823   01/28/20 1200  vancomycin (VANCOCIN) IVPB 1000 mg/200 mL premix  1,000 mg 200 mL/hr over 60 Minutes Intravenous Every 12 hours 01/28/20 0825     01/28/20 0800  ceFEPIme (MAXIPIME) 2 g in sodium chloride 0.9 % 100 mL IVPB  Status:   Discontinued     2 g 200 mL/hr over 30 Minutes Intravenous Every 8 hours 01/28/20 0240 01/28/20 1707   01/28/20 0245  vancomycin (VANCOCIN) IVPB 1000 mg/200 mL premix     1,000 mg 200 mL/hr over 60 Minutes Intravenous  Once 01/28/20 0241 01/28/20 0534   01/28/20 0200  metroNIDAZOLE (FLAGYL) IVPB 500 mg  Status:  Discontinued     500 mg 100 mL/hr over 60 Minutes Intravenous Every 8 hours 01/28/20 0108 01/28/20 1707   01/27/20 2230  vancomycin (VANCOCIN) IVPB 1000 mg/200 mL premix     1,000 mg 200 mL/hr over 60 Minutes Intravenous  Once 01/27/20 2221 01/28/20 0028   01/27/20 2230  ceFEPIme (MAXIPIME) 2 g in sodium chloride 0.9 % 100 mL IVPB     2 g 200 mL/hr over 30 Minutes Intravenous  Once 01/27/20 2221 01/27/20 2356     Subjective  The patient is resting comfortably. He is non-verbal at baseline. His sister is at baseline. Prior to COVID infection, this patient lived alone and managed his own affairs.  Objective   Vitals:  Vitals:   01/30/20 1251 01/30/20 1353  BP: (!) 111/54   Pulse: 69 66  Resp:  18  Temp: 98.3 F (36.8 C)   SpO2: 95% 93%   Exam:  Constitutional:  The patient is awake. No acute distress. He tells me that he is "alright". Respiratory:  . No increased work of breathing. . No wheezes, rales, or rhonchi . No tactile fremitus Cardiovascular:  . Regular rate and rhythm . No murmurs, ectopy, or gallups. . No lateral PMI. No thrills. Abdomen:  . Abdomen is soft, non-tender, non-distended . No hernias, masses, or organomegaly . Normoactive bowel sounds.  Musculoskeletal:  . No cyanosis, clubbing, or edema Skin:  . No rashes . Large sacral decubitus ulcer with necrotic tissue . palpation of skin: no induration or nodules Neurologic:  . CN 2-12 intact . Sensation all 4 extremities intact Psychiatric:  . Unable to evaluate as the patient is unable to cooperate with exam.  I have personally reviewed the following:   Today's Data  . Vitals, CBC,  BMP  Micro Data  . Blood cultures x 2 - no growth . Wound cultures - E. Faecium and possible anaerobe  Imaging  . CT abdomen and pelvis: Sacral decubitus ulcer extending to bone with fluid collection in the central aspect of the gluteal musculature that is suspicious for abscess. Patchy and streaky opacities in the lung bases may represent pneumonia. . CXR Interstitial and ill-defined airspace opacities at the bilateral lung bases persis, but have improved as compared to CXR 01/21/2020.   Cardiology Data  . EKG: Repolarization abnormality vs severe global ischemia. New since 08/2019.  Scheduled Meds: . amiodarone  100 mg Per Tube BID  . benztropine  0.5 mg Per Tube BID  . carbidopa-levodopa  1 tablet Per Tube BID  . diltiazem  30 mg Per Tube QID  . famotidine  20 mg Per Tube Daily  . feeding supplement (PRO-STAT SUGAR FREE 64)  30 mL Per Tube Daily  . free water  100 mL Per Tube Q4H  . hydrALAZINE  50 mg Per Tube BID  . insulin aspart  0-9 Units Subcutaneous Q4H  . ipratropium-albuterol  3 mL Nebulization TID  .  metoprolol tartrate  12.5 mg Per Tube BID  . nutrition supplement (JUVEN)  1 packet Per Tube BID BM  . thiamine  200 mg Per Tube Daily   Continuous Infusions: . feeding supplement (JEVITY 1.5 CAL/FIBER) Stopped (01/30/20 0004)  . piperacillin-tazobactam 3.375 g (01/30/20 1004)  . 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 Sepsis ([place]) Due to sacral decubitus ulcer. In ED upon presentation the patient demonstrated fever, tachycardia, tachypnea, and hypotension. WBC was elevated. Lactic acid was 1.7 and procalcitonin was 0.28. Initially this was felt to be due to  pneumonia. However the patient had received treatment with Unasyn for pneumonia in February. CXR demonstrated persistent radiological evidence of pneumonia, but clinically the patient has no adventitious lung sounds, no cough, and he is saturating 98% on room air. However, CT does show evidence of Stage IV decubitus sacral ulcer with abscess. Possible bone involvement. He has gone for I & D with general surgery. He will likely need to go back on Tuesday. Patient had a very complex prolonged post Covid stay.  No discharge summary but per records apparently was discharged on Augmentin with Mid line in place no IV antibiotics were ordered at the time of discharge to SNF.  Mid line does not appear to be infected and functional.   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 S/P I & D with general surgery. He will likely need to go back on Tuesday. Will consult ID in the am. The patient is receiving IV Zosyn and Vancomycin. The patient has a history of shrapnel would not be a good candidate for MRI. CRP elevated at 9.7. ESR is 126. Albumin 2.1. Culture has grown out E. Faecium and possible anaerobe. He has gone back to the OR today for further debridement. The patient is receiving IV Zosyn and Vancomycin. Infectious disease is concerned that these wounds are unlikely to heal given the patient's poor nutritional and motility status. Palliative care has been consulted.  Possible recurrent aspiration pneumonia versus HCAP: Speech pathology will be asked to evaluate the patient's swallow.  Patient status post PEG tube. Tube feeds have been restarted. I appreciate Nutrition's assistance.   Hypertension: Chronic. Continue home medications.  Dysphagia: Pt has a PEG tube. Plans is for SLP swallow eval and nutrition consult. Tube feeds have been restarted.  Severe protein calorie malnutrition: Nutrition consult appreciated. This severely impacts the patient's ability to heal these wounds.    Anemia: Likely due to chronic disease. Will check iron studies.   Chronic atrial fibrillation (HCC) - The patient has a CHADS2VASC score of >3. Resume eliquis when cleared by general surgery. Rate is well controlled on metoprolol and amiodarone which have been continued.   DM 2: Hb A1c is 4.2. Glucoses to be followed by FSBS and sensitive  SSI. Oral antihyperglycemics have been held. He will be on a carbohydrate controlled diet and hypoglycemic protocol.  Parkinson disease: Noted. Likely playing a part in patient's history of aspiration. Continue home medications.  Encephalopathy: Status post hypoxic respiratory arrest requiring intubation in the setting of Covid in December 2020.  Patient with some minimal improvements per family started  to respond to his name at times but mainly remains nonverbal bedbound incontinent status post PEG tube.  Lurline Idol has been decannulated per LTAC notes if patient has significant secretions may need to replace tracheostomy.  At this point appears to be stable.  I have seen and examined this patient myself. I have spent 36 minutes in his evaluation and care.  DVT prophylaxis:  SCD resume eliquis if no surgical intervention Code Status:    DNR/DNI   as per family  I had personally discussed CODE STATUS with  Family  Family Communication:   Family not at bedside. Disposition Plan: From SNF. Anticipate return to facility when wounds are stabilized and plan for antibiotics and wound care is determined. Palliative care has been consulted and it is possible that this patient would go to hospice upon discharge.   Kyle Gatchel, DO [facility] Direct contact: see [email or web address]  7PM-7AM contact night coverage as above  01/30/2020, 2:32 PM  LOS: 0 days
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
## 2020-01-30 NOTE — Interval H&P Note (Signed)
History and Physical Interval Note:  01/30/2020 11:17 AM  Kyle [name]  has presented today for surgery, with the diagnosis of SACRAL AND LEFT HIP DECUBITUS.  The various methods of treatment have been discussed with the patient and family. After consideration of risks, benefits and other options for treatment, the patient has consented to  Procedure(s): APPLICATION OF WOUND VAC (Left) IRRIGATION AND DEBRIDEMENT WOUND (N/A) as a surgical intervention.  The patient's history has been reviewed, patient examined, no change in status, stable for surgery.  I have reviewed the patient's chart and labs.  Questions were answered to the patient's satisfaction.     [name]

## 2020-01-30 NOTE — Transfer of Care (Signed)
Immediate Anesthesia Transfer of Care Note  Patient: [facility]  Procedure(s) Performed: APPLICATION OF WOUND VAC (Left ) IRRIGATION AND DEBRIDEMENT WOUND (Left )  Patient Location: PACU  Anesthesia Type:General  Level of Consciousness: unresponsive  Airway & Oxygen Therapy: Patient connected to face mask oxygen  Post-op Assessment: Post -op Vital signs reviewed and stable  Post vital signs: stable  Last Vitals:  Vitals Value Taken Time  BP 118/51   Temp 98.1   Pulse 78 01/30/20 1222  Resp 29 01/30/20 1222  SpO2 99 % 01/30/20 1222  Vitals shown include unvalidated device data.  Last Pain:  Vitals:   01/30/20 1059  TempSrc: Temporal         Complications: No apparent anesthesia complications

## 2020-01-30 NOTE — Progress Notes (Signed)
Pharmacy Antibiotic Note  Kyle [name] is a 74 y.o. male admitted on 01/27/2020 with sepsis. Patient originally presented from nursing home due to concern for fever and was septic in ED. Exam notable for infected sacral decubitus ulcer now status post irrigation and debridement on 3/7. Pharmacy has been consulted for vancomycin dosing for infected sacral decubitus ulcer. Patient is also on pip/tazo.   Patient is scheduled to return to OR 3/9 for second look and wound vac change. Wound cultures from soft tissue and bone biopsy significant for Enterococcus faecium (susceptibilities pending)  Plan: Vancomycin 1000 mg IV Q 12 hrs. Goal AUC 400-550. Expected AUC: 437 SCr used: 0.8 Css: 12.2   Scr stable so far this admission. Monitor for nephrotoxicity with vancomycin and pip/tazo combination. Daily Scr per protocol. Trough and peak ordered around midnight dose.   Height: 5\' 9"  (175.3 cm) Weight: 190 lb (86.2 kg) IBW/kg (Calculated) : 70.7  Temp (24hrs), Avg:98.4 F (36.9 C), Min:98 F (36.7 C), Max:99 F (37.2 C)  Recent Labs  Lab 01/27/20 2242 01/27/20 2323 01/28/20 0419 01/29/20 0319 01/30/20 0520  WBC 11.3*  --  9.0 7.2 8.5  CREATININE 0.51*  --  0.43* 0.47* 0.49*  LATICACIDVEN  --  1.7  --   --   --     Estimated Creatinine Clearance: 90.8 mL/min (A) (by C-G formula based on SCr of 0.49 mg/dL (L)).    No Known Allergies  Antimicrobials this admission: Cefepime 3/6 >> 3/7 Metronidazole 3/7 >> 3/7 Vancomycin 3/6 >>  Pip/tazo 3/7 >>   Microbiology results: 3/7 Wound Cx (bone biopsy): rare Enterococcus faecium 3/7 Wound Cx (fluid): No growth x 2 days 3/7 Wound Cx (soft tissue): Abundant Enterococcus faecium 3/7 Wound Cx (soft tissue): No growth x 2 days 3/6 BCx: NG x 3 days   Thank you for allowing pharmacy to be a part of this patient's care.  5/7 Pharmacy Resident 01/30/2020 7:49 AM

## 2020-01-31 ENCOUNTER — Inpatient Hospital Stay: Payer: Medicare Other

## 2020-01-31 DIAGNOSIS — Z7189 Other specified counseling: Secondary | ICD-10-CM

## 2020-01-31 DIAGNOSIS — L8944 Pressure ulcer of contiguous site of back, buttock and hip, stage 4: Secondary | ICD-10-CM

## 2020-01-31 DIAGNOSIS — Z515 Encounter for palliative care: Secondary | ICD-10-CM

## 2020-01-31 LAB — CBC
HCT: 26.1 % — ABNORMAL LOW (ref 39.0–52.0)
Hemoglobin: 7.5 g/dL — ABNORMAL LOW (ref 13.0–17.0)
MCH: 32.8 pg (ref 26.0–34.0)
MCHC: 28.7 g/dL — ABNORMAL LOW (ref 30.0–36.0)
MCV: 114 fL — ABNORMAL HIGH (ref 80.0–100.0)
Platelets: 427 10*3/uL — ABNORMAL HIGH (ref 150–400)
RBC: 2.29 MIL/uL — ABNORMAL LOW (ref 4.22–5.81)
RDW: 17 % — ABNORMAL HIGH (ref 11.5–15.5)
WBC: 8.5 10*3/uL (ref 4.0–10.5)
nRBC: 0 % (ref 0.0–0.2)

## 2020-01-31 LAB — BASIC METABOLIC PANEL
Anion gap: 6 (ref 5–15)
BUN: 19 mg/dL (ref 8–23)
CO2: 26 mmol/L (ref 22–32)
Calcium: 10.5 mg/dL — ABNORMAL HIGH (ref 8.9–10.3)
Chloride: 117 mmol/L — ABNORMAL HIGH (ref 98–111)
Creatinine, Ser: 0.44 mg/dL — ABNORMAL LOW (ref 0.61–1.24)
GFR calc Af Amer: >60 mL/min (ref >60)
GFR calc non Af Amer: >60 mL/min (ref >60)
Glucose, Bld: 155 mg/dL — ABNORMAL HIGH (ref 70–99)
Potassium: 3.7 mmol/L (ref 3.5–5.1)
Sodium: 149 mmol/L — ABNORMAL HIGH (ref 135–145)

## 2020-01-31 LAB — GLUCOSE, CAPILLARY
Glucose-Capillary: 149 mg/dL — ABNORMAL HIGH (ref 70–99)
Glucose-Capillary: 152 mg/dL — ABNORMAL HIGH (ref 70–99)
Glucose-Capillary: 126 mg/dL — ABNORMAL HIGH (ref 70–99)
Glucose-Capillary: 136 mg/dL — ABNORMAL HIGH (ref 70–99)
Glucose-Capillary: 126 mg/dL — ABNORMAL HIGH (ref 70–99)
Glucose-Capillary: 96 mg/dL (ref 70–99)

## 2020-01-31 LAB — VANCOMYCIN, TROUGH: Vancomycin Tr: 30 ug/mL (ref 15–20)

## 2020-01-31 LAB — VANCOMYCIN, PEAK: Vancomycin Pk: 39 ug/mL (ref 30–40)

## 2020-01-31 LAB — MRSA PCR SCREENING: MRSA by PCR: NEGATIVE

## 2020-01-31 MED ORDER — FLUCONAZOLE 40 MG/ML PO SUSR
200.0000 mg | ORAL | Status: AC
  Administered 2020-02-01 – 2020-02-03 (×2): 200 mg
  Filled 2020-01-31 (×4): qty 5

## 2020-01-31 MED ORDER — FLUCONAZOLE 40 MG/ML PO SUSR
400.0000 mg | Freq: Once | ORAL | Status: AC
  Administered 2020-01-31: 400 mg
  Filled 2020-01-31 (×2): qty 10

## 2020-01-31 MED ORDER — SODIUM CHLORIDE 0.9 % IV SOLN
3.0000 g | Freq: Four times a day (QID) | INTRAVENOUS | Status: DC
  Administered 2020-01-31 – 2020-02-15 (×58): 3 g via INTRAVENOUS
  Filled 2020-01-31 (×2): qty 3
  Filled 2020-01-31 (×3): qty 8
  Filled 2020-01-31: qty 3
  Filled 2020-01-31: qty 8
  Filled 2020-01-31: qty 3
  Filled 2020-01-31: qty 8
  Filled 2020-01-31: qty 3
  Filled 2020-01-31 (×3): qty 8
  Filled 2020-01-31: qty 3
  Filled 2020-01-31 (×5): qty 8
  Filled 2020-01-31: qty 3
  Filled 2020-01-31: qty 8
  Filled 2020-01-31 (×2): qty 3
  Filled 2020-01-31: qty 8
  Filled 2020-01-31: qty 3
  Filled 2020-01-31: qty 8
  Filled 2020-01-31 (×2): qty 3
  Filled 2020-01-31 (×2): qty 8
  Filled 2020-01-31: qty 3
  Filled 2020-01-31 (×2): qty 8
  Filled 2020-01-31: qty 3
  Filled 2020-01-31 (×3): qty 8
  Filled 2020-01-31: qty 3
  Filled 2020-01-31: qty 8
  Filled 2020-01-31: qty 3
  Filled 2020-01-31 (×2): qty 8
  Filled 2020-01-31 (×5): qty 3
  Filled 2020-01-31 (×2): qty 8
  Filled 2020-01-31 (×3): qty 3
  Filled 2020-01-31: qty 8
  Filled 2020-01-31 (×2): qty 3
  Filled 2020-01-31 (×2): qty 8
  Filled 2020-01-31 (×3): qty 3
  Filled 2020-01-31: qty 8
  Filled 2020-01-31 (×2): qty 3
  Filled 2020-01-31: qty 8
  Filled 2020-01-31: qty 3
  Filled 2020-01-31: qty 8

## 2020-01-31 MED ORDER — SODIUM CHLORIDE 0.9 % IV SOLN
700.0000 mg | Freq: Every day | INTRAVENOUS | Status: DC
  Administered 2020-01-31 – 2020-02-14 (×14): 700 mg via INTRAVENOUS
  Filled 2020-01-31 (×16): qty 14

## 2020-01-31 NOTE — Progress Notes (Signed)
[facility] SURGICAL PROGRESS NOTE  Hospital Day(s): 3.   Post op day(s): 1 Day Post-Op.   Interval History:  Patient seen and examined no acute events or new complaints overnight.  Patient does not provide much history Cultures growing vancomycin resistant enterococcus faecium No leukocytosis Hemoglobin stable low at 7.5; no signs of active bleeding No issues with wound vac   Vital signs in last 24 hours: [min-max] current  Temp:  [97.7 F (36.5 C)-99.3 F (37.4 C)] 99 F (37.2 C) (03/10 1201) Pulse Rate:  [64-90] 79 (03/10 1201) Resp:  [18-29] 21 (03/10 1201) BP: (106-159)/(51-78) 134/78 (03/10 1201) SpO2:  [93 %-99 %] 96 % (03/10 1201)     Height: 5\' 9"  (175.3 cm) Weight: 86.2 kg BMI (Calculated): 28.05   Intake/Output last 2 shifts:  03/09 0701 - 03/10 0700 In: 4568.5 [I.V.:100; 05/10; IV Piggyback:1184.5] Out: 855 [Urine:850; Blood:5]   Physical Exam:  Constitutional: alert, cooperative and no distress  Respiratory: breathing non-labored at rest  Cardiovascular: regular rate and sinus rhythm  Integumentary: Wound vac to the sacrum and left trochanteric which are bridged together and then this is bridged further to the left anterior thigh, good seal.    Labs:  CBC Latest Ref Rng & Units 01/31/2020 01/30/2020 01/29/2020  WBC 4.0 - 10.5 K/uL 8.5 8.5 7.2  Hemoglobin 13.0 - 17.0 g/dL 7.5(L) 7.2(L) 7.2(L)  Hematocrit 39.0 - 52.0 % 26.1(L) 24.4(L) 24.4(L)  Platelets 150 - 400 K/uL 427(H) 434(H) 394   CMP Latest Ref Rng & Units 01/31/2020 01/30/2020 01/29/2020  Glucose 70 - 99 mg/dL 03/30/2020) 89 75  BUN 8 - 23 mg/dL 19 497(W) 26(V)  Creatinine 0.61 - 1.24 mg/dL 78(H) 8.85(O) 2.77(A)  Sodium 135 - 145 mmol/L 149(H) 147(H) 144  Potassium 3.5 - 5.1 mmol/L 3.7 3.3(L) 3.3(L)  Chloride 98 - 111 mmol/L 117(H) 113(H) 114(H)  CO2 22 - 32 mmol/L 26 27 24   Calcium 8.9 - 10.3 mg/dL 10.5(H) 11.1(H) 10.6(H)  Total Protein 6.5 - 8.1 g/dL - - 6.5  Total Bilirubin 0.3 -  1.2 mg/dL - - 0.5  Alkaline Phos 38 - 126 U/L - - 130(H)  AST 15 - 41 U/L - - 29  ALT 0 - 44 U/L - - 14     Imaging studies: No new pertinent imaging studies   Assessment/Plan:  74 y.o. male 1 Day Post-Op s/p second debridement of sacral pressure ulcer and placement of wound VAC greater than 50 cm   - Will plan on wound vac change on Friday 03/12 either in the OR or at bedside   - Adjust ABx per culture results; vancomycin resistant enterococcus faecium    - Continue IVF resuscitation   - agree palliative care consult would be reasonable   - Continue frequent positioning changes, offloading, low air loss mattress   - further management per primary team   All of the above findings and recommendations were discussed with the medical team  -- Friday, PA-C  [facility] 01/31/2020, 12:13 PM [phone] M-F: 7am - 4pm

## 2020-01-31 NOTE — Progress Notes (Addendum)
[place] INFECTIOUS DISEASE PROGRESS NOTE Date of Admission:  01/27/2020     ID: Kyle [name] is a 74 y.o. 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  No fevers.  ROS  Unable to obtain  Medications:  Antibiotics Given (last 72 hours)    Date/Time Action Medication Dose Rate   01/28/20 1813 New Bag/Given   piperacillin-tazobactam (ZOSYN) IVPB 3.375 g 3.375 g 12.5 mL/hr   01/29/20 0008 New Bag/Given   vancomycin (VANCOCIN) IVPB 1000 mg/200 mL premix 1,000 mg 200 mL/hr   01/29/20 0112 New Bag/Given   piperacillin-tazobactam (ZOSYN) IVPB 3.375 g 3.375 g 12.5 mL/hr   01/29/20 1517 New Bag/Given   piperacillin-tazobactam (ZOSYN) IVPB 3.375 g 3.375 g 12.5 mL/hr   01/29/20 1254 New Bag/Given   vancomycin (VANCOCIN) IVPB 1000 mg/200 mL premix 1,000 mg 200 mL/hr   01/29/20 1557 New Bag/Given   piperacillin-tazobactam (ZOSYN) IVPB 3.375 g 3.375 g 12.5 mL/hr   01/30/20 0005 New Bag/Given   vancomycin (VANCOCIN) IVPB 1000 mg/200 mL premix 1,000 mg 200 mL/hr   01/30/20 0107 New Bag/Given   piperacillin-tazobactam (ZOSYN) IVPB 3.375 g 3.375 g 12.5 mL/hr   01/30/20 1004 New Bag/Given   piperacillin-tazobactam (ZOSYN) IVPB 3.375 g 3.375 g 12.5 mL/hr   01/30/20 1405 New Bag/Given   vancomycin (VANCOCIN) IVPB 1000 mg/200 mL premix 1,000 mg 200 mL/hr   01/30/20 1734 New Bag/Given   piperacillin-tazobactam (ZOSYN) IVPB 3.375 g 3.375 g 12.5 mL/hr   01/31/20 0100 New Bag/Given   piperacillin-tazobactam (ZOSYN) IVPB 3.375 g 3.375 g 12.5 mL/hr   01/31/20 0105 New Bag/Given   vancomycin (VANCOCIN) IVPB 1000 mg/200 mL premix 1,000 mg 200 mL/hr   01/31/20 0840 New  Bag/Given   piperacillin-tazobactam (ZOSYN) IVPB 3.375 g 3.375 g 12.5 mL/hr   01/31/20 1305 New Bag/Given   vancomycin (VANCOCIN) IVPB 1000 mg/200 mL premix 1,000 mg 200 mL/hr     . amiodarone  100 mg Per Tube BID  . benztropine  0.5 mg Per Tube BID  . carbidopa-levodopa  1 tablet Per Tube BID  . diltiazem  30 mg Per Tube QID  . famotidine  20 mg Per Tube Daily  . feeding supplement (PRO-STAT SUGAR FREE 64)  30 mL Per Tube Daily  . free water  100 mL Per Tube Q4H  . hydrALAZINE  50 mg Per Tube BID  . insulin aspart  0-9 Units Subcutaneous Q4H  . ipratropium-albuterol  3 mL Nebulization TID  . metoprolol tartrate  12.5 mg Per Tube BID  . nutrition supplement (JUVEN)  1 packet Per Tube BID BM  . thiamine  200 mg Per Tube Daily    Objective: Vital signs in last 24 hours: Temp:  [97.7 F (36.5 C)-99.3 F (37.4 C)] 99 F (37.2 C) (03/10 1201) Pulse Rate:  [64-90] 79 (03/10 1201) Resp:  [19-24] 21 (03/10 1201) BP: (123-159)/(64-78) 134/78 (03/10 1201) SpO2:  [96 %-99 %] 96 % (03/10 1348) Constitutional: He is awake,but minimally interactive  HENT: anicetric  Mouth/Throat: Oropharynx is clear and dry. No oropharyngeal exudate.  Cardiovascular: Normal rate, regular rhythm and normal heart sounds. Pulmonary/Chest: Effort normal and breath sounds normal.   Abdominal: Soft. Bowel sounds are  normal. He exhibits no distension. PEG Tube in place Lymphadenopathy: He has no cervical adenopathy.  Neurological: confused Skin: wound vac in place  Psychiatric: unable to assess  Lab Results Recent Labs    01/30/20 0520 01/31/20 0302  WBC 8.5 8.5  HGB 7.2* 7.5*  HCT 24.4* 26.1*  NA 147* 149*  K 3.3* 3.7  CL 113* 117*  CO2 27 26  BUN 25* 19  CREATININE 0.49* 0.44*    Microbiology: Results for orders placed or performed during the hospital encounter of 01/27/20  Blood Culture (routine x 2)     Status: None (Preliminary result)   Collection Time: 01/27/20 10:42 PM   Specimen:  BLOOD  Result Value Ref Range Status   Specimen Description BLOOD BLOOD LEFT HAND  Final   Special Requests   Final    BOTTLES DRAWN AEROBIC AND ANAEROBIC Blood Culture adequate volume   Culture   Final    NO GROWTH 4 DAYS Performed at [facility], [address] [place], [place] [address]    Report Status PENDING  Incomplete  Blood Culture (routine x 2)     Status: None (Preliminary result)   Collection Time: 01/27/20 10:42 PM   Specimen: BLOOD  Result Value Ref Range Status   Specimen Description BLOOD BLOOD LEFT HAND  Final   Special Requests   Final    BOTTLES DRAWN AEROBIC AND ANAEROBIC Blood Culture adequate volume   Culture   Final    NO GROWTH 4 DAYS Performed at [facility], [address] [place], [place] [address]    Report Status PENDING  Incomplete  Respiratory Panel by RT PCR (Flu A&B, Covid) - Nasopharyngeal Swab     Status: None   Collection Time: 01/27/20 11:57 PM   Specimen: Nasopharyngeal Swab  Result Value Ref Range Status   SARS Coronavirus 2 by RT PCR NEGATIVE NEGATIVE Final    Comment: (NOTE) SARS-CoV-2 target nucleic acids are NOT DETECTED. The SARS-CoV-2 RNA is generally detectable in upper respiratoy specimens during the acute phase of infection. The lowest concentration of SARS-CoV-2 viral copies this assay can detect is 131 copies/mL. A negative result does not preclude SARS-Cov-2 infection and should not be used as the sole basis for treatment or other patient management decisions. A negative result may occur with  improper specimen collection/handling, submission of specimen other than nasopharyngeal swab, presence of viral mutation(s) within the areas targeted by this assay, and inadequate number of viral copies (<131 copies/mL). A negative result must be combined with clinical observations, patient history, and epidemiological information. The expected result is Negative. Fact Sheet for Patients:   PinkCheek.be Fact Sheet for Healthcare Providers:  GravelBags.it This test is not yet ap proved or cleared by the [place] FDA and  has been authorized for detection and/or diagnosis of SARS-CoV-2 by FDA under an Emergency Use Authorization (EUA). This EUA will remain  in effect (meaning this test can be used) for the duration of the COVID-19 declaration under Section 564(b)(1) of the Act, 21 U.S.C. section 360bbb-3(b)(1), unless the authorization is terminated or revoked sooner.    Influenza A by PCR NEGATIVE NEGATIVE Final   Influenza B by PCR NEGATIVE NEGATIVE Final    Comment: (NOTE) The Xpert Xpress SARS-CoV-2/FLU/RSV assay is intended as an aid in  the diagnosis of influenza from Nasopharyngeal swab specimens and  should not be used as a sole basis for treatment. Nasal washings and  aspirates are unacceptable for Xpert Xpress SARS-CoV-2/FLU/RSV  testing. Fact Sheet for Patients: PinkCheek.be Fact Sheet for Healthcare Providers: GravelBags.it This test is not yet approved or cleared by the [place] FDA and  has been authorized for detection and/or diagnosis of SARS-CoV-2 by  FDA under an Emergency Use Authorization (EUA). This EUA will remain  in effect (meaning this test can be used) for the duration of the  Covid-19 declaration under Section 564(b)(1) of the Act, 21  U.S.C. section 360bbb-3(b)(1), unless the authorization is  terminated or revoked. Performed at [facility], [address] [place], [place] [address]   Aerobic/Anaerobic Culture (surgical/deep wound)     Status: None (Preliminary result)   Collection Time: 01/28/20  1:06 PM   Specimen: PATH Soft tissue  Result Value Ref Range Status   Specimen Description   Final    TISSUE Performed at [facility], [address], [place], [place] [address]    Special  Requests   Final    NONE Performed at [facility], [address] [place], [place] [address]    Gram Stain   Final    RARE WBC PRESENT,BOTH PMN AND MONONUCLEAR NO ORGANISMS SEEN    Culture   Final    NO GROWTH 3 DAYS Performed at [facility], [place] [address] [place], [place] [address]    Report Status PENDING  Incomplete  Aerobic/Anaerobic Culture (surgical/deep wound)     Status: None (Preliminary result)   Collection Time: 01/28/20  1:54 PM   Specimen: Wound; Body Fluid  Result Value Ref Range Status   Specimen Description   Final    WOUND Performed at [facility], [address] [place], [place] [address]    Special Requests   Final    NONE Performed at [facility], [address] [place], [place] [address]    Gram Stain   Final    FEW WBC PRESENT, PREDOMINANTLY PMN NO ORGANISMS SEEN    Culture   Final    NO GROWTH 3 DAYS Performed at [facility] [address], [place], [place] [address]    Report Status PENDING  Incomplete  Aerobic/Anaerobic Culture (surgical/deep wound)     Status: None (Preliminary result)   Collection Time: 01/28/20  1:55 PM   Specimen: PATH Soft tissue  Result Value Ref Range Status   Specimen Description   Final    TISSUE Performed at [facility], [address], [place], [place] [address]    Special Requests   Final    NONE Performed at [facility], [address] [place], [place] [address]    Gram Stain   Final    RARE WBC PRESENT, PREDOMINANTLY PMN FEW GRAM POSITIVE COCCI IN PAIRS FEW GRAM VARIABLE ROD    Culture   Final    ABUNDANT ENTEROCOCCUS FAECIUM FEW CANDIDA ALBICANS HOLDING FOR POSSIBLE ANAEROBE Performed at [facility], [place] [address] [place], [place] [address]    Report Status PENDING  Incomplete  Aerobic/Anaerobic Culture (surgical/deep wound)     Status: None (Preliminary result)   Collection Time: 01/28/20  2:17 PM   Specimen: PATH  Bone biopsy; Tissue  Result Value Ref Range Status   Specimen Description   Final    BIOPSY Performed at [facility], [address] [place], [place] [address]    Special Requests   Final    NONE Performed at [facility], [address] [place],  [address]  Gram Stain   Final    NO WBC SEEN NO ORGANISMS SEEN Performed at [facility], [place] [address], [place], [place] [address]    Culture   Final    RARE ENTEROCOCCUS FAECIUM NO ANAEROBES ISOLATED; CULTURE IN PROGRESS FOR 5 DAYS VANCOMYCIN RESISTANT ENTEROCOCCUS ISOLATED    Report Status PENDING  Incomplete   Organism ID, Bacteria ENTEROCOCCUS FAECIUM  Final      Susceptibility   Enterococcus faecium - MIC*    AMPICILLIN >=32 RESISTANT Resistant     VANCOMYCIN >=32 RESISTANT Resistant     GENTAMICIN SYNERGY SENSITIVE Sensitive     LINEZOLID 2 SENSITIVE Sensitive     * RARE ENTEROCOCCUS FAECIUM    Studies/Results: No results found.  Assessment/Plan: Kyle [name] is a 74 y.o. male admitted March 6 with a fever from the nursing home.  He has a very complicated medical history.  He suffered Covid pneumonia in December and had a prolonged hospitalization including tracheostomy and PEG tube placement.  He was at a long-term acute care facility and recently discharged to a skilled nursing facility.  He was sent to the hospital due to fever and found to have a large infected appearing decubitus ulcer.  CT scan showed the sacral decub which extended to the bone with a fluid collection suspicious for an abscess.  On admission temperature was 101.8 white count was 11.3.  He was taken to the operating room March 7 by Dr. Hampton Abbot.  Extensive debridement was done of the sacral wound as well as the infected left hip and leg ulcer and the left ischial wound.  There was a sacral bone biopsy was done.  Wound VAC was placed.He is unable to provide me a history.  He has extensive wounds down to bone. These are  unlikely to heal unless his mobility status improves and nutritional status improves (alb 2.0) which seems unlikely at point. ESR 126, crp 9.6.   3/9 - s/p repeat surgery and site remains clean 3/10 no fevers, noted to have foreign body in pharnyx and ENT did scope for removal Cx with VRE, candida, possible anaerobe I would strongly suggest palliative care for goals of care.   Rec DC  vanco and start dapto. Change zosyn to unasyn pending cx results.  Fluconazole per GT for a week for the yeast. If aggressive care is warranted he will need PICC. Given his multiple comorbidity would strongly suggest palliative care.  Thank you very much for the consult. Will follow with you.  Kyle [name]   01/31/2020, 2:29 PM

## 2020-01-31 NOTE — Progress Notes (Addendum)
PROGRESS NOTE    Kyle [name]  XQJ:[id number] DOB: 04/05/1946 DOA: 01/27/2020  PCP: [facility]    LOS - 3   Brief Narrative:  Kyle [name] a 74 y.o.malewith medical history significant of COVID12/08/2019. CVA, prediabetes, PE around 2018 started on Xarelto, chronic tremors of bilateral hands, bipolar disorder, morbid obesity,CKD, HLD, neuropathy, chronic atrial fibrillation, DM2,COPD, and Parkinson disease. He presented withfever from SNF. He had been discharged from SNF after a 10 day inpatient stay for acute hypoxemic respiratory failure due to COVID-19. During this stay the patient was treated with Remdesivir, decadron, Rocephin and azithromycin.   In December patient was admitted for Covid with acute respiratory hypoxemic failure treated remdesivir 10 days of Decadron and Rocephin and azithromycin for possible bacterial superinfection in [place]. This was followed by a roughly three week stay at [facility]. This was followed by a three day stay at [facility].   His hospital stay in [place] was complicated by tachycardia patient was seen by cardiology started Cardizem metoprolol. The patient suffered respiratory arrest on 24 November 2019 and had to be intubated. He had an EEG showing slowing consistent with mild residual encephalopathy patient required tracheostomy and PEG tube placement. He later on developed pressure sacral decubitus ulcer which required debridement.He required broad-spectrum antibiotics IV vancomycin and Zosyn. He then transferred to [facility] in [place]. His antibiotics were later simplified to cefepime. His trach was de cannulated. Patient remains nonverbal. In February he developed pneumonia worrisome for aspiration and was treated Unasyn. He failed trial of decannulation plan for him to have a ENT consult done to place an hour trach he had copious secretion requiring frequent suctioning as well as nasotracheal suctioning.  Patient was finally yesterday discharged from [place] to SNF3/02/2020, but continued to be febrile. As soon as he presented to facility and was transferred to [facility] emergency department today.  The patient was admitted to a telemetry bed. General surgery and Infectious disease were consulted, and the patient went for operative I & D on 01/28/2020. Cultures were taken. He is receiving IV   The patient has been admitted to a telemetry bed. General surgery has been consulted and has taken the patient for operative I&D of the sacral wound on 01/28/2020. Cultures were taken. Infectious disease has also been consulted. The patient is receiving IV Zosyn and Vancomycin. Nutrition has been consulted and the patient has been restarted on tube feeds. SLP has been consulted for swallow eval, although this has not yet taken place as the patient has been in procedures. Cultures have grown enterococcus and possible anaerobes. Infectious disease feels that it will be unlikely that these wounds will heal given the patient's poor nutritional and mobility status. He has recommended palliative care consult.  Returned to the OR again 3/9 for further debridement.  Subjective 3/10: Patient seen with SLP and RN at bedside.  No acute events reported overnight.  Patient nonverbal, does not follow commands, but awake and alert.  He appears in no distress or discomfort aside from very dry mouth.  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 Lactic acid was 1.7 and procalcitonin was 0.28.  Initially this was felt to be due to pneumonia. However the patient had  received treatment with Unasyn for pneumonia in February. CXR demonstrated persistent radiological evidence of pneumonia, but clinically the patient has no adventitious lung sounds, no cough, and he is saturating 98% on room air. However, CT does show evidence of Stage IV decubitus sacral ulcer with abscess and possible bone involvement. He has gone for I & D with general surgery twice this admission.  Patient had a very complex prolonged post Covid stay, led to trach/PEG, LTAC stay. No discharge summary but per records apparently was discharged on Augmentin with Midline in place no IV antibiotics were ordered at the time of discharge to SNF. Midline does not appear to be infected and functional.   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  S/P I & D with general surgery x 2. Will consult ID in the am. The patient is receiving IV Zosyn and Vancomycin. The patient has a history of shrapnel, so would not be a good candidate for MRI. CRP elevated at 9.7. ESR is 126. Albumin 2.1. Culture has grown out E. Faecium and possible anaerobe. --Continue IV Unasyn and Daptomycin --ID consulted, appreciate recommendations --unlikely sacral wound will heal with poor nutritional and functional status, palliative consulted --palliative is scheduled for family meeting on Friday at 2pm  ?Recurrent aspiration pneumonia versus HCAP:  --Speech evaluation --ENT consulted for retained material seen in the vallecula on esophagram today --continue tube feeds by PEG tube  Dysphagia: mgmt as above  Hypernatremia: Na 149 today.  Getting free water 100 cc q4h via PEG tube.   Will recheck tomorrow and increase free water if rising.  Hypertension: Chronic. Continue home medications.  Severe protein calorie malnutrition: Nutrition consult appreciated. This severely impacts the patient's ability to heal these wounds.   Anemia: Likely due to chronic disease. Will check iron  studies.  Chronic atrial fibrillation (HCC)-The patient has a CHADS2VASC score of >3. Resume eliquis when cleared by general surgery. Rate is well controlled on metoprolol and amiodarone which have been continued.  Type 2 Diabetes: Hb A1c is 4.2. Glucoses to be followed by FSBS and sensitive SSI. Oral antihyperglycemics have been held. He will be on a carbohydrate controlled diet and hypoglycemic protocol.  Parkinson disease: Noted. Likely playing a part in patient's history of aspiration.  --Continue Cogentin, Sinemet  Encephalopathy:Status post hypoxic respiratory arrest requiring intubation in the setting of Covid in December 2020.Patient with some minimal improvements per family started to respond to his name at times but mainly remains nonverbal bedbound incontinent status post PEG tube. Lurline Idol has been decannulated perLTAC notes if patient has significant secretions may need to replace tracheostomy. At this point appears to be stable.   DVT prophylaxis: SCD's   Code Status: DNR  Family Communication: none at bedside during encounter, will call   Disposition Plan:  To SNF pending improvement and palliative meeting regarding goals of care Coming From SNF (recently to SNF from [place]) Exp DC Date TBD Barriers GOC's to be determined Friday, IV antibiotics for ?sacral osteomyelitis/ulcer infection Medically Stable for Discharge? No   Consultants:   General Surgery  Infectious Disease  Palliative Care   Antimicrobials:   Unasyn  Daptomycin     Objective: Vitals:   01/31/20 0144 01/31/20 0145 01/31/20 0422 01/31/20 0825  BP: 126/71  (!) 159/71   Pulse: 68 66 87 90  Resp: (!) 24  20 (!) 22  Temp:  99.3 F (37.4  C) 99.2 F (37.3 C)   TempSrc:  Oral Oral   SpO2: 98% 98% 98% 97%  Weight:      Height:        Intake/Output Summary (Last 24 hours) at 01/31/2020 0833 Last data filed at 01/31/2020 0300 Gross per 24 hour  Intake 4568.5 ml  Output 855 ml  Net  3713.5 ml   Filed Weights   01/27/20 2220  Weight: 86.2 kg    Examination:  General exam: awake, alert, no acute distress, obese HEENT: atraumatic, clear conjunctiva, anicteric sclera, very dry mucus membranes Respiratory system: clear but diminished bilaterally, no wheezes, rales or rhonchi, normal respiratory effort. Cardiovascular system: normal S1/S2, RRR, no pedal edema.   Gastrointestinal system: soft, NT, ND, no HSM felt, +bowel sounds, PEG tube present. Central nervous system: Alert, unable to assess orientation in nonverbal patient, no gross focal neurologic deficits, does not follow commands Extremities: moves all, no edema, normal tone Psychiatry: normal mood, flat affect    Data Reviewed: I have personally reviewed following labs and imaging studies  CBC: Recent Labs  Lab 01/27/20 2242 01/28/20 0419 01/29/20 0319 01/30/20 0520 01/31/20 0302  WBC 11.3* 9.0 7.2 8.5 8.5  NEUTROABS 5.5  --   --   --   --   HGB 8.4* 7.4* 7.2* 7.2* 7.5*  HCT 29.5* 25.0* 24.4* 24.4* 26.1*  MCV 115.7* 111.6* 111.4* 112.4* 114.0*  PLT 446* 393 394 434* 465*   Basic Metabolic Panel: Recent Labs  Lab 01/27/20 2242 01/28/20 0419 01/29/20 0319 01/30/20 0520 01/31/20 0302  NA 143 145 144 147* 149*  K 3.7 3.3* 3.3* 3.3* 3.7  CL 107 108 114* 113* 117*  CO2 '27 26 24 27 26  '$ GLUCOSE 113* 108* 75 89 155*  BUN 45* 40* 28* 25* 19  CREATININE 0.51* 0.43* 0.47* 0.49* 0.44*  CALCIUM 12.2* 11.6* 10.6* 11.1* 10.5*  MG  --  2.3  --   --   --   PHOS  --  3.3  --   --   --    GFR: Estimated Creatinine Clearance: 90.8 mL/min (A) (by C-G formula based on SCr of 0.44 mg/dL (L)). Liver Function Tests: Recent Labs  Lab 01/27/20 2242 01/28/20 0419 01/29/20 0319  AST 51* 38 29  ALT 64* 65* 14  ALKPHOS 168* 145* 130*  BILITOT 0.5 0.5 0.5  PROT 7.9 7.0 6.5  ALBUMIN 2.3* 2.0* 2.1*   No results for input(s): LIPASE, AMYLASE in the last 168 hours. No results for input(s): AMMONIA in the last  168 hours. Coagulation Profile: No results for input(s): INR, PROTIME in the last 168 hours. Cardiac Enzymes: No results for input(s): CKTOTAL, CKMB, CKMBINDEX, TROPONINI in the last 168 hours. BNP (last 3 results) No results for input(s): PROBNP in the last 8760 hours. HbA1C: No results for input(s): HGBA1C in the last 72 hours. CBG: Recent Labs  Lab 01/30/20 1224 01/30/20 1634 01/30/20 2015 01/30/20 2349 01/31/20 0417  GLUCAP 83 78 103* 124* 149*   Lipid Profile: No results for input(s): CHOL, HDL, LDLCALC, TRIG, CHOLHDL, LDLDIRECT in the last 72 hours. Thyroid Function Tests: No results for input(s): TSH, T4TOTAL, FREET4, T3FREE, THYROIDAB in the last 72 hours. Anemia Panel: No results for input(s): VITAMINB12, FOLATE, FERRITIN, TIBC, IRON, RETICCTPCT in the last 72 hours. Sepsis Labs: Recent Labs  Lab 01/27/20 2323 01/28/20 0419 01/29/20 0319  PROCALCITON  --  0.28 0.20  LATICACIDVEN 1.7  --   --     Recent Results (  from the past 240 hour(s))  Novel Coronavirus, NAA (hospital order; send-out to ref lab)     Status: None   Collection Time: 01/24/20  3:00 PM   Specimen: Nasopharyngeal Swab; Respiratory  Result Value Ref Range Status   SARS-CoV-2, NAA NOT DETECTED NOT DETECTED Final    Comment: (NOTE) This nucleic acid amplification test was developed and its performance characteristics determined by [employer]. Nucleic acid amplification tests include RT-PCR and TMA. This test has not been FDA cleared or approved. This test has been authorized by FDA under an Emergency Use Authorization (EUA). This test is only authorized for the duration of time the declaration that circumstances exist justifying the authorization of the emergency use of in vitro diagnostic tests for detection of SARS-CoV-2 virus and/or diagnosis of COVID-19 infection under section 564(b)(1) of the Act, 21 U.S.C. 056PVX-4(I) (1), unless the authorization is terminated or revoked  sooner. When diagnostic testing is negative, the possibility of a false negative result should be considered in the context of a patient's recent exposures and the presence of clinical signs and symptoms consistent with COVID-19. An individual without symptoms of COVID- 19 and who is not shedding SARS-CoV-2  virus would expect to have a negative (not detected) result in this assay. Performed At: [facility] [address] [place], [place] [id number] Rush Farmer MD OL:[id number]    [place]  Final    Comment: Performed at [facility], [place] [address] [place], [place] [address]  Blood Culture (routine x 2)     Status: None (Preliminary result)   Collection Time: 01/27/20 10:42 PM   Specimen: BLOOD  Result Value Ref Range Status   Specimen Description BLOOD BLOOD LEFT HAND  Final   Special Requests   Final    BOTTLES DRAWN AEROBIC AND ANAEROBIC Blood Culture adequate volume   Culture   Final    NO GROWTH 4 DAYS Performed at [facility], [address], [place], [place] [address]    Report Status PENDING  Incomplete  Blood Culture (routine x 2)     Status: None (Preliminary result)   Collection Time: 01/27/20 10:42 PM   Specimen: BLOOD  Result Value Ref Range Status   Specimen Description BLOOD BLOOD LEFT HAND  Final   Special Requests   Final    BOTTLES DRAWN AEROBIC AND ANAEROBIC Blood Culture adequate volume   Culture   Final    NO GROWTH 4 DAYS Performed at [facility], [address], [place], [place] [address]    Report Status PENDING  Incomplete  Respiratory Panel by RT PCR (Flu A&B, Covid) - Nasopharyngeal Swab     Status: None   Collection Time: 01/27/20 11:57 PM   Specimen: Nasopharyngeal Swab  Result Value Ref Range Status   SARS Coronavirus 2 by RT PCR NEGATIVE NEGATIVE Final    Comment: (NOTE) SARS-CoV-2 target nucleic acids are NOT DETECTED. The SARS-CoV-2 RNA is generally detectable in upper  respiratoy specimens during the acute phase of infection. The lowest concentration of SARS-CoV-2 viral copies this assay can detect is 131 copies/mL. A negative result does not preclude SARS-Cov-2 infection and should not be used as the sole basis for treatment or other patient management decisions. A negative result may occur with  improper specimen collection/handling, submission of specimen other than nasopharyngeal swab, presence of viral mutation(s) within the areas targeted by this assay, and inadequate number of viral copies (<131 copies/mL). A negative result must be combined with  clinical observations, patient history, and epidemiological information. The expected result is Negative. Fact Sheet for Patients:  PinkCheek.be Fact Sheet for Healthcare Providers:  GravelBags.it This test is not yet ap proved or cleared by the [place] FDA and  has been authorized for detection and/or diagnosis of SARS-CoV-2 by FDA under an Emergency Use Authorization (EUA). This EUA will remain  in effect (meaning this test can be used) for the duration of the COVID-19 declaration under Section 564(b)(1) of the Act, 21 U.S.C. section 360bbb-3(b)(1), unless the authorization is terminated or revoked sooner.    Influenza A by PCR NEGATIVE NEGATIVE Final   Influenza B by PCR NEGATIVE NEGATIVE Final    Comment: (NOTE) The Xpert Xpress SARS-CoV-2/FLU/RSV assay is intended as an aid in  the diagnosis of influenza from Nasopharyngeal swab specimens and  should not be used as a sole basis for treatment. Nasal washings and  aspirates are unacceptable for Xpert Xpress SARS-CoV-2/FLU/RSV  testing. Fact Sheet for Patients: PinkCheek.be Fact Sheet for Healthcare Providers: GravelBags.it This test is not yet approved or cleared by the [place] FDA and  has been authorized for  detection and/or diagnosis of SARS-CoV-2 by  FDA under an Emergency Use Authorization (EUA). This EUA will remain  in effect (meaning this test can be used) for the duration of the  Covid-19 declaration under Section 564(b)(1) of the Act, 21  U.S.C. section 360bbb-3(b)(1), unless the authorization is  terminated or revoked. Performed at [facility], [address] [place], [place] [address]   Aerobic/Anaerobic Culture (surgical/deep wound)     Status: None (Preliminary result)   Collection Time: 01/28/20  1:06 PM   Specimen: PATH Soft tissue  Result Value Ref Range Status   Specimen Description   Final    TISSUE Performed at [facility], [address], [place], [place] [address]    Special Requests   Final    NONE Performed at [facility], [address] [place], [place] [address]    Gram Stain   Final    RARE WBC PRESENT,BOTH PMN AND MONONUCLEAR NO ORGANISMS SEEN    Culture   Final    NO GROWTH 2 DAYS Performed at [facility], [place] [address] [place], [place] [address]    Report Status PENDING  Incomplete  Aerobic/Anaerobic Culture (surgical/deep wound)     Status: None (Preliminary result)   Collection Time: 01/28/20  1:54 PM   Specimen: Wound; Body Fluid  Result Value Ref Range Status   Specimen Description   Final    WOUND Performed at [facility], [address] [place], [place] [address]    Special Requests   Final    NONE Performed at [facility], [address] [place], [place] [address]    Gram Stain   Final    FEW WBC PRESENT, PREDOMINANTLY PMN NO ORGANISMS SEEN    Culture   Final    NO GROWTH 2 DAYS Performed at [facility] [address] [place], [place] [address]    Report Status PENDING  Incomplete  Aerobic/Anaerobic Culture (surgical/deep wound)     Status: None (Preliminary result)   Collection Time: 01/28/20  1:55 PM   Specimen: PATH Soft tissue  Result Value Ref Range  Status   Specimen Description   Final    TISSUE Performed at [facility], [address], [place], [place] [address]    Special Requests   Final  NONE Performed at [facility], [address] [place], [place] [address]    Gram Stain   Final    RARE WBC PRESENT, PREDOMINANTLY PMN FEW GRAM POSITIVE COCCI IN PAIRS FEW GRAM VARIABLE ROD    Culture   Final    ABUNDANT ENTEROCOCCUS FAECIUM SUSCEPTIBILITIES TO FOLLOW HOLDING FOR POSSIBLE ANAEROBE Performed at [facility], [place] [address], [place], [place] [address]    Report Status PENDING  Incomplete  Aerobic/Anaerobic Culture (surgical/deep wound)     Status: None (Preliminary result)   Collection Time: 01/28/20  2:17 PM   Specimen: PATH Bone biopsy; Tissue  Result Value Ref Range Status   Specimen Description   Final    BIOPSY Performed at [facility], [address], [place], [place] [address]    Special Requests   Final    NONE Performed at [facility], [address] [place], [place] [address]    Gram Stain   Final    NO WBC SEEN NO ORGANISMS SEEN Performed at [facility], [place] [address] [place],  [address]    Culture   Final    RARE ENTEROCOCCUS FAECIUM NO ANAEROBES ISOLATED; CULTURE IN PROGRESS FOR 5 DAYS    Report Status PENDING  Incomplete         Radiology Studies: No results found.      Scheduled Meds: . amiodarone  100 mg Per Tube BID  . benztropine  0.5 mg Per Tube BID  . carbidopa-levodopa  1 tablet Per Tube BID  . diltiazem  30 mg Per Tube QID  . famotidine  20 mg Per Tube Daily  . feeding supplement (PRO-STAT SUGAR FREE 64)  30 mL Per Tube Daily  . free water  100 mL Per Tube Q4H  . hydrALAZINE  50 mg Per Tube BID  . insulin aspart  0-9 Units Subcutaneous Q4H  . ipratropium-albuterol  3 mL Nebulization TID  . metoprolol tartrate  12.5 mg Per Tube BID  . nutrition supplement (JUVEN)  1 packet Per Tube BID BM  . thiamine   200 mg Per Tube Daily   Continuous Infusions: . feeding supplement (JEVITY 1.5 CAL/FIBER) Stopped (01/30/20 0004)  . piperacillin-tazobactam 3.375 g (01/31/20 0100)  . vancomycin 1,000 mg (01/31/20 0105)     LOS: 3 days    Time spent: 21 minutes    Ezekiel Slocumb, DO [facility]   If 7PM-7AM, please contact night-coverage [email or web address] 01/31/2020, 8:33 AM

## 2020-01-31 NOTE — Care Management Important Message (Signed)
Important Message  Patient Details  Name: Kyle [name] MRN: [id number] Date of Birth: Nov 04, 1946   Medicare Important Message Given:  Yes     [name] 01/31/2020, 12:10 PM

## 2020-01-31 NOTE — TOC Initial Note (Signed)
Transition of Care [facility]) - Initial/Assessment Note    Patient Details  Name: Kyle [name] MRN: [id number] Date of Birth: [birth year]  [facility] CM/SW Contact:    Victorino Dike, RN Phone Number: 01/31/2020, 3:11 PM  Clinical Narrative:                   Met with patient in room, minimal response to my voice.  Spoke with sister Joycelyn Schmid on the phone, she reports there is a meeting with Palliative Care set up for Friday at 2pm.  Verified the [facility] will accept patient back to there facility at discharge pending appropriateness of care.  Will continue to follow case.   Expected Discharge Plan: Skilled Nursing Facility Barriers to Discharge: Continued Medical Work up   Patient Goals and CMS Choice     Choice offered to / list presented to : Sibling  Expected Discharge Plan and Services Expected Discharge Plan: [place]                                              Prior Living Arrangements/Services   Lives with:: Facility Resident                   Activities of Daily Living      Permission Sought/Granted                  Emotional Assessment              Admission diagnosis:  Sepsis ([place]) [A41.9] Fever, unspecified fever cause [R50.9] Pneumonia due to infectious organism, unspecified laterality, unspecified part of lung [J18.9] Patient Active Problem List   Diagnosis Date Noted  . 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 SIRS (systemic inflammatory response syndrome) ([place]) 01/28/2020  . HCAP (healthcare-associated pneumonia) 01/28/2020  . Sepsis ([place]) 01/28/2020  . Decubitus ulcer of sacral region, unstageable ([place])   . Acute on chronic respiratory failure with hypoxia ([place])   . COVID-19 virus infection   . Pneumonia due to COVID-19 virus   . Acute metabolic encephalopathy   . Chronic kidney disease, stage III (moderate)   . Chronic atrial  fibrillation ([place])   . Syncope and collapse 08/30/2019  . Falls/very unsteady gait 08/29/2019  . Syncope 08/28/2019  . BPH (benign prostatic hyperplasia) 03/17/2018  . Dyslipidemia 03/17/2018  . Bipolar I disorder, current or most recent episode manic, with psychotic features ([place]) 03/16/2018  . History of stroke 03/16/2018  . Current use of anticoagulant therapy 03/16/2018  . History of pulmonary embolism 03/16/2018  . Bipolar 1 disorder, depressed ([place]) 02/03/2017  . Catatonia 01/26/2017  . Diabetes ([place]) 01/26/2017  . Suicide attempt ([place])   . SVT (supraventricular tachycardia) ([place])   . Noncompliance 09/26/2015  . RLS (restless legs syndrome) 08/08/2015  . Peripheral neuropathy ([place]) 08/08/2015  . Cerebrovascular disease 08/07/2015  . Bipolar I disorder depressed with melancholic features ([place]) 07/86/7544  . Essential tremor 08/01/2015  . Hypertension 04/17/2015   PCP:  [facility]:   [place], [place] - [address] [place] [address] Phone: [phone] Fax: [phone]     Social Determinants of Health (SDOH) Interventions    Readmission Risk Interventions No flowsheet data found.
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
## 2020-01-31 NOTE — Progress Notes (Deleted)
Pt noted to have some confusion during the night. He attempted to get OOB and stated "his wife was on the way to pick him up". I Called pt's wife per his request. Informed wife of pt's condition and she quickly reoriented him. Upon re-assessment of his VS I noticed the O2 saturation was high 80's to low 90s. Increased O2 from 3L to 4.5LNC. NP notified and orders for repeat imaging in the AM. NP does not want to give meds to patient for insomnia due to increase in O2 requirement.  

## 2020-01-31 NOTE — Progress Notes (Signed)
Physical Therapy Treatment Patient Details Name: Kyle [name] MRN: [id number] DOB: 1946/09/24 Today's Date: 01/31/2020    History of Present Illness Kyle [name] is a 74 y.o. male with medical history significant of COVID 11/02/2019.  CVA, prediabetes, PE around 2018 started on Xarelto, chronic tremors of bilateral hands, bipolar disorder, morbid obesity CKD HLD neuropathy chronic atrial fibrillation DM2 COPD, Parkinson disease. He has been in [place] and was discharged to SNF on 01/25/20. Was febrile when arrived to SNF and sent here to ED.    PT Comments    Pt was long sitting in bed with RN at bedside and pt's sister in room. He agrees to PT session and per sister has been much more talkative this date. He was able to actively participate in conversation however continues to present with confusion. He was able to tolerate and participate in mostly PROM but did perform some AAROM with LLE. PROM performed to all extremities. Pt scheduled to have palliative care meeting Friday. Therapist quarter turned pt to R to un-weight L side and decreased pressure. Pt was left comfortably in bed with sister present. He tolerated session well and will continue to be followed per POC. PT continues to recommend SNF at d/c.     Follow Up Recommendations  SNF     Equipment Recommendations  Other (comment)(defer to next level of care)    Recommendations for Other Services       Precautions / Restrictions Precautions Precautions: Fall Restrictions Weight Bearing Restrictions: No    Mobility  Bed Mobility Overal bed mobility: Needs Assistance Bed Mobility: Rolling Rolling: Max assist;+2 for physical assistance         General bed mobility comments: therapist performed rolling to R to assist pt with positioning to promote wound healing and pressure relief.  Transfers                    Ambulation/Gait                 Stairs             Wheelchair Mobility    Modified  Rankin (Stroke Patients Only)       Balance                                            Cognition Arousal/Alertness: Awake/alert Behavior During Therapy: Flat affect Overall Cognitive Status: Impaired/Different from baseline                                 General Comments: pt's sister present throughout session and reports that pt is more talkative and  alert      Exercises General Exercises - Upper Extremity Shoulder Flexion: PROM Shoulder Extension: PROM Shoulder ABduction: PROM Shoulder ADduction: PROM Shoulder Horizontal ABduction: PROM Other Exercises Other Exercises: Therapist performed AAROM and PROM to all extremities. He does participate in LLE ROM but moreso passive than active    General Comments        Pertinent Vitals/Pain      Home Living                      Prior Function            PT Goals (current goals can now be found in the care plan section)  Progress towards PT goals: Progressing toward goals    Frequency    Min 2X/week      PT Plan Current plan remains appropriate    Co-evaluation              AM-PAC PT "6 Clicks" Mobility   Outcome Measure  Help needed turning from your back to your side while in a flat bed without using bedrails?: Total Help needed moving from lying on your back to sitting on the side of a flat bed without using bedrails?: Total Help needed moving to and from a bed to a chair (including a wheelchair)?: Total Help needed standing up from a chair using your arms (e.g., wheelchair or bedside chair)?: Total Help needed to walk in hospital room?: Total Help needed climbing 3-5 steps with a railing? : Total 6 Click Score: 6    End of Session Equipment Utilized During Treatment: Gait belt Activity Tolerance: Patient limited by lethargy Patient left: in bed;with family/visitor present;with call bell/phone within reach Nurse Communication: Mobility status PT Visit  Diagnosis: Other abnormalities of gait and mobility (R26.89);Muscle weakness (generalized) (M62.81)     Time: 1610-1630 PT Time Calculation (min) (ACUTE ONLY): 20 min  Charges:  $Therapeutic Exercise: 8-22 mins                     Jetta Lout PTA 01/31/20, 4:51 PM
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
## 2020-02-01 LAB — CBC
HCT: 24.6 % — ABNORMAL LOW (ref 39.0–52.0)
Hemoglobin: 7.3 g/dL — ABNORMAL LOW (ref 13.0–17.0)
MCH: 34 pg (ref 26.0–34.0)
MCHC: 29.7 g/dL — ABNORMAL LOW (ref 30.0–36.0)
MCV: 114.4 fL — ABNORMAL HIGH (ref 80.0–100.0)
Platelets: 364 10*3/uL (ref 150–400)
RBC: 2.15 MIL/uL — ABNORMAL LOW (ref 4.22–5.81)
RDW: 16.8 % — ABNORMAL HIGH (ref 11.5–15.5)
WBC: 7.2 10*3/uL (ref 4.0–10.5)
nRBC: 0 % (ref 0.0–0.2)

## 2020-02-01 LAB — CK: Total CK: 27 U/L — ABNORMAL LOW (ref 49–397)

## 2020-02-01 LAB — BASIC METABOLIC PANEL
Anion gap: 7 (ref 5–15)
BUN: 13 mg/dL (ref 8–23)
CO2: 29 mmol/L (ref 22–32)
Calcium: 10.2 mg/dL (ref 8.9–10.3)
Chloride: 117 mmol/L — ABNORMAL HIGH (ref 98–111)
Creatinine, Ser: 0.4 mg/dL — ABNORMAL LOW (ref 0.61–1.24)
GFR calc Af Amer: >60 mL/min (ref >60)
GFR calc non Af Amer: >60 mL/min (ref >60)
Glucose, Bld: 143 mg/dL — ABNORMAL HIGH (ref 70–99)
Potassium: 3.6 mmol/L (ref 3.5–5.1)
Sodium: 153 mmol/L — ABNORMAL HIGH (ref 135–145)

## 2020-02-01 LAB — GLUCOSE, CAPILLARY
Glucose-Capillary: 101 mg/dL — ABNORMAL HIGH (ref 70–99)
Glucose-Capillary: 106 mg/dL — ABNORMAL HIGH (ref 70–99)
Glucose-Capillary: 113 mg/dL — ABNORMAL HIGH (ref 70–99)
Glucose-Capillary: 119 mg/dL — ABNORMAL HIGH (ref 70–99)
Glucose-Capillary: 120 mg/dL — ABNORMAL HIGH (ref 70–99)
Glucose-Capillary: 130 mg/dL — ABNORMAL HIGH (ref 70–99)

## 2020-02-01 LAB — AEROBIC/ANAEROBIC CULTURE W GRAM STAIN (SURGICAL/DEEP WOUND)

## 2020-02-01 LAB — CULTURE, BLOOD (ROUTINE X 2)
Culture: NO GROWTH
Culture: NO GROWTH
Special Requests: ADEQUATE
Special Requests: ADEQUATE

## 2020-02-01 LAB — MAGNESIUM: Magnesium: 2.9 mg/dL — ABNORMAL HIGH (ref 1.7–2.4)

## 2020-02-01 MED ORDER — FREE WATER
100.0000 mL | Status: DC
  Administered 2020-02-01 – 2020-02-03 (×15): 100 mL

## 2020-02-01 MED ORDER — POTASSIUM CHLORIDE 20 MEQ PO PACK
40.0000 meq | PACK | Freq: Once | ORAL | Status: AC
  Administered 2020-02-01: 40 meq
  Filled 2020-02-01: qty 2

## 2020-02-01 NOTE — Progress Notes (Signed)
[facility] SURGICAL PROGRESS NOTE  Hospital Day(s): 4.   Post op day(s): 2 Days Post-Op.   Interval History:  Patient seen and examined no acute events or new complaints overnight.  Patient does not provide much history; no complaints of pain Cultures growing vancomycin resistant enterococcus faecium; switched to Unasyn and Daptomycin No issues with wound vac   Vital signs in last 24 hours: [min-max] current  Temp:  [97.4 F (36.3 C)-99 F (37.2 C)] 98.2 F (36.8 C) (03/11 0752) Pulse Rate:  [79-89] 87 (03/11 0810) Resp:  [18-21] 18 (03/11 0810) BP: (134-157)/(58-78) 144/67 (03/11 0752) SpO2:  [95 %-97 %] 97 % (03/11 0810)     Height: 5\' 9"  (175.3 cm) Weight: 86.2 kg BMI (Calculated): 28.05   Intake/Output last 2 shifts:  03/10 0701 - 03/11 0700 In: 2454 [NG/GT:1740; IV Piggyback:514] Out: 1550 [Urine:1550]   Physical Exam:  Constitutional: alert, cooperative and no distress  Respiratory: breathing non-labored at rest  Cardiovascular: regular rate and sinus rhythm  Integumentary: Wound vac to the sacrum and left trochanteric which are bridged together and then this is bridged further to the left anterior thigh, good seal.    Labs:  CBC Latest Ref Rng & Units 02/01/2020 01/31/2020 01/30/2020  WBC 4.0 - 10.5 K/uL 7.2 8.5 8.5  Hemoglobin 13.0 - 17.0 g/dL 7.3(L) 7.5(L) 7.2(L)  Hematocrit 39.0 - 52.0 % 24.6(L) 26.1(L) 24.4(L)  Platelets 150 - 400 K/uL 364 427(H) 434(H)   CMP Latest Ref Rng & Units 02/01/2020 01/31/2020 01/30/2020  Glucose 70 - 99 mg/dL 03/31/2020) 767(H) 89  BUN 8 - 23 mg/dL 13 19 419(F)  Creatinine 0.61 - 1.24 mg/dL 79(K) 2.40(X) 7.35(H)  Sodium 135 - 145 mmol/L 153(H) 149(H) 147(H)  Potassium 3.5 - 5.1 mmol/L 3.6 3.7 3.3(L)  Chloride 98 - 111 mmol/L 117(H) 117(H) 113(H)  CO2 22 - 32 mmol/L 29 26 27   Calcium 8.9 - 10.3 mg/dL 2.99(M 10.5(H) 11.1(H)  Total Protein 6.5 - 8.1 g/dL - - -  Total Bilirubin 0.3 - 1.2 mg/dL - - -  Alkaline Phos 38 - 126  U/L - - -  AST 15 - 41 U/L - - -  ALT 0 - 44 U/L - - -     Imaging studies: No new pertinent imaging studies   Assessment/Plan:  74 y.o. male 2 Days Post-Op s/p second debridement of sacral pressure ulcer and placement of wound VAC greater than 50 cm   - Will plan on wound vac change on Friday 03/12 in the OR with Dr Sunday; will discuss with family later today  - Adjust ABx per culture results; vancomycin resistant enterococcus faecium; now on Unasyn and Daptomycin  - Continue IVF resuscitation   - agree palliative care consult would be reasonable   - Continue frequent positioning changes, offloading, low air loss mattress   - further management per primary team   All of the above findings and recommendations were discussed with the medical team  -- 05/12, PA-C [facility] 02/01/2020, 9:16 AM [phone] M-F: 7am - 4pm

## 2020-02-01 NOTE — Progress Notes (Signed)
  Speech Language Pathology Treatment: Dysphagia  Patient Details Name: Kyle [name] MRN: [id number] DOB: 1946-02-27 Today's Date: 02/01/2020 Time: 1507-1600 SLP Time Calculation (min) (ACUTE ONLY): 53 min  Assessment / Plan / Recommendation Clinical Impression  Pt seen today for ongoing assessment of swallowing; dysphagia tx targeting stimulation of swallowing. Continued discussion w/ NSG and pt/Sister about the importance of frequent oral care for hygiene and stimulation of swallowing. Pt awakened easily, though appeared min tired. He was verbal and answered SLP's basic y/n questions w/ additional few words/comments intermittently. Noted slower processing and verbal responses, but they appeared accurate. Pt required full feeding assistance; positioning upright assistance. NSG reported providing ongoing oral care.  Reviewed chart notes; ENT was able to assess and remove via direct view and Glide Scope a large mass of material was noted in pt's hypopharynx/Valleculae yesterday. Pt verbalized it felt "better" in his throat now. Noted continued involuntary lingual movements at rest. Upon attending to presented oral care and/or single ice chips, noted the lingual movements calmed. Post thorough oral care, single ice chips were given. Noted pt's immediate mastication of each chip and appropriate oral management of each chip. Pt appeared to maintain oral control of the trials; no anterior loss. Pt demonstrated pharyngeal swallows w/ each trial; laryngeal excursion palpated. No overt, clinical s/s of aspiration noted - no decline in respiratory effort, no wet vocal quality b/t trials when assessed, no wet respirations. Pt stated the ice chips were "good" and "delicious". He endorsed wanting "more tomorrow". Pt's engagement w/ SLP was felt to be appropriate w/ pt exhibiting follow through w/ basic instructions.  Recommend continue w/ frequent oral care for hygiene and stimulation of swallowing. SLP will f/u w/  ice chips trials tomorrow for another assessment tx session w/ recommendation to continue for Pleasure w/ NSG staff after that if pt exhibits appropriate, safe toleration. Education posted in room re: oral care. Pt's Sister was contacted by telephone for an updated and seemed pleased w/ pt's progress. MD/NSG updated. ST services will f/u tomorrow.      HPI HPI: Pt is a 74 y.o. male with medical history significant of Respiratory Arrest Jan. 1, 2021 requirig tracheostomy and PEG tube placement, (now decannulated); NPO status since Jan. 1, 2021; Covid 11/02/2019; CVA; prediabetes; PE around 2018 started on Xarelto; chronic tremors of bilateral hands; Bipolar Disorder; morbid Obesity; CKD; HLD; neuropathy; chronic atrial fibrillation; DM2; COPD; Parkinson disease. He has been in LT[facility] since Jan. 2021, and was discharged to SNF on 01/25/2020. Was febrile when arrived to SNF and sent here to this ED on 01/27/2020.  Per pt's Sister, pt has not received skilled ST services for Dysphagia.      SLP Plan  Continue with current plan of care       Recommendations  Diet recommendations: NPO(w/ TFs via PEG) Medication Administration: Via alternative means(PEG)                General recommendations: (Dietician following) Oral Care Recommendations: Oral care QID;Staff/trained caregiver to provide oral care Follow up Recommendations: Skilled Nursing facility SLP Visit Diagnosis: Dysphagia, oropharyngeal phase (R13.12);Cognitive communication deficit (Y85.027) Plan: Continue with current plan of care       GO                [name], MS, CCC-SLP Tami Barren 02/01/2020, 4:08 PM

## 2020-02-01 NOTE — Anesthesia Postprocedure Evaluation (Signed)
Anesthesia Post Note  Patient: Kyle [name]  Procedure(s) Performed: IRRIGATION AND DEBRIDEMENT BUTTOCKS (N/A Buttocks) APPLICATION OF WOUND VAC TO BUTTOCKS AND LEFT HIP WOUNDS (N/A Buttocks) IRRIGATION AND DEBRIDEMENT WOUND LEFT HIP (Left Hip)  Patient location during evaluation: PACU Anesthesia Type: General Level of consciousness: awake and alert Pain management: pain level controlled Vital Signs Assessment: post-procedure vital signs reviewed and stable Respiratory status: spontaneous breathing, nonlabored ventilation, respiratory function stable and patient connected to nasal cannula oxygen Cardiovascular status: blood pressure returned to baseline and stable Postop Assessment: no apparent nausea or vomiting Anesthetic complications: no     Last Vitals:  Vitals:   02/01/20 0752 02/01/20 0810  BP: (!) 144/67   Pulse: 89 87  Resp: 18 18  Temp: 36.8 C   SpO2: 97% 97%    Last Pain:  Vitals:   02/01/20 0520  TempSrc: Oral                 Christia Reading

## 2020-02-01 NOTE — Progress Notes (Signed)
PROGRESS NOTE    Kyle [name]  KPT:[id number] DOB: [birth year] DOA: 01/27/2020  PCP: [facility]    LOS - 4   Brief Narrative:  Kyle [name] a 74 y.o.malewith medical history significant of COVID12/08/2019. CVA, prediabetes, PE around 2018 started on Xarelto, chronic tremors of bilateral hands, bipolar disorder, morbid obesity,CKD, HLD, neuropathy, chronic atrial fibrillation, DM2,COPD, and Parkinson disease.He presented withfever from SNF. He had been discharged from SNF after a 10 day inpatient stay for acute hypoxemic respiratory failure due to COVID-19. During this stay the patient was treated with Remdesivir, decadron, Rocephin and azithromycin.   In December patient was admitted for Covid with acute respiratory hypoxemic failure treated remdesivir 10 days of Decadron and Rocephin and azithromycin for possible bacterial superinfection in [place]. This was followed by a roughly three week stay at [facility]. This was followed by a three day stay at [facility].   His hospital stay in [place] was complicated by tachycardia patient was seen by cardiology started Cardizem metoprolol.The patient suffered respiratory arrest on 24 November 2019 and had to be intubated.He had an EEG showing slowing consistent with mild residual encephalopathy patient required tracheostomy and PEG tube placement. He later on developed pressure sacral decubitus ulcer which required debridement.He required broad-spectrum antibiotics IV vancomycin and Zosyn. He thentransferred to [name] in [place].His antibiotics were later simplified to cefepime. His trach was de cannulated.Patient remains nonverbal.In February he developed pneumonia worrisome for aspiration and was treated Unasyn.Hefailed trial of decannulation plan for him to have a ENT consult done to place an hour trach he had copious secretion requiring frequent suctioning as well as nasotracheal  suctioning.Patient was finally yesterday discharged from [facility] to SNF3/02/2020, but continued to be febrile.As soon as he presented to facility and was transferred to [facility] emergency department today.  The patient was admitted to a telemetry bed. General surgery and Infectious disease were consulted, and the patient went for operative I & D on 01/28/2020. Cultures were taken. He is receiving IV  The patient has been admitted to a telemetry bed. General surgery has been consulted and has taken the patient for operative I&D of the sacral wound on 01/28/2020. Cultures were taken. Infectious disease has also been consulted.The patient is receiving IV Zosyn and Vancomycin. Nutrition has been consulted and the patient has been restarted on tube feeds. SLP has been consulted for swallow eval, although this has not yet taken place as the patient has been in procedures. Cultures have grown enterococcus and possible anaerobes. Infectious disease feels that it will be unlikely that these wounds will heal given the patient's poor nutritional and mobility status. He has recommended palliative care consult.  Returned to the OR again 3/9 for further debridement.  Subjective 3/11: Patient seen at bedside this morning.  No acute events reported overnight.  According to notes, patient was talkative with his sister in the room yesterday afternoon.  This morning, patient's nurse reported he was talking to her earlier as well.  Patient is nonverbal for me during encounter.  Appears in no distress.  Does not squeeze hands on command but does take deep breaths on command during lung exam.  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 Lactic acid was 1.7 and procalcitonin was 0.28.  Initially this was felt to be due to pneumonia. However the patient had received treatment with Unasyn for pneumonia in February. CXR demonstrated persistent radiological evidence of pneumonia, but clinically the patient has no adventitious lung sounds, no cough, and he is saturating 98% on room air. However, CT does show evidence of Stage IV decubitus sacral ulcer with abscess and possible bone involvement. He has gone for I &D with general surgery twice this admission.  Patient had a very complex prolonged post Covid stay, led to trach/PEG, LTAC stay. No discharge summary but per records apparently was discharged on Augmentin with Midline in place no IV antibiotics were ordered at the time of discharge to SNF. Midline does not appear to be infected and functional.   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  S/P I & D with general surgery x 2. Will consult ID in the am. The patient is receiving IV Zosyn and Vancomycin. The patient has a history of shrapnel, so would not be a good candidate for MRI.CRP elevated at 9.7. ESR is 126.Albumin 2.1. Culture has grown out Vanc resistant E. Faecium and possible anaerobe. --ID consulted, appreciate recommendations --Continue on IV Unasyn and Daptomycin  --unlikely sacral wound will heal with poor nutritional and functional status, palliative consulted --palliative is scheduled for family meeting on Friday at 2pm --general surgery following --wound vac change in OR planned for tomorrow 3/12  ?Recurrent aspiration pneumonia versus HCAP: ENT was able to remove a large piece of retained material from vallecula on 3/11. --Speech evaluations, ongoing --continue tube feeds by PEG tube  Dysphagia: mgmt as  above  Hypernatremia: Na up to 153 today.  Getting free water flushes by PEG tube.  --increase free water flushes to 100 cc q2h (from q4h) --recheck BMP in AM  Hypertension: Chronic. Continue home medications.  Severe protein calorie malnutrition: Nutrition consult appreciated. This severely impacts the patient's ability to heal these wounds.  Anemia: Likely due to chronic disease. Will check iron studies.  Chronic atrial fibrillation (HCC)-The patient has a CHADS2VASC score of >3. Resume eliquis when cleared by general surgery. Rate is well controlled on metoprolol and amiodarone which have been continued.  Type 2 Diabetes: Hb A1c is 4.2. Glucoses to be followed by FSBS and sensitive SSI. Oral antihyperglycemics have been held. He will be on a carbohydrate controlled diet and hypoglycemic protocol.  Parkinson disease: Noted. Likely playing a part in patient's history of aspiration.  --Continue Cogentin, Sinemet  Encephalopathy:Status post hypoxic respiratory arrest requiring intubation in the setting of Covid in December 2020.Patient with some minimal improvements per family started to respond to his name at times but mainly remains nonverbal bedbound incontinent status post PEG tube. Lurline Idol has been decannulated perLTAC notes if patient has significant secretions may need to replace tracheostomy. At this point appears to be stable.    DVT prophylaxis: SCD's   Code Status: DNR  Family Communication: none at bedside during encounter, will call sister   Disposition Plan:  To SNF pending improvement and clearance by general surgery.  Palliative care meeting with family tomorrow at 2pm. Coming From SNF Exp DC Date TBD Barriers as above Medically Stable for Discharge? No   Consultants:   General Surgery  Infectious Disease  Palliative Care  Procedures:  Surgical debridement of sacral decubitus ulcer x 2  Antimicrobials:   Unasyn, Daptomycin     Objective: Vitals:   01/31/20 2011 02/01/20 0520 02/01/20 0752 02/01/20 0810  BP: (!) 157/60 (!) 144/58 (!) 144/67   Pulse: 79 82 89 87  Resp: '20 18 18 18  '$ Temp: (!) 97.4 F (36.3 C) 97.8 F (36.6 C) 98.2 F (36.8 C)   TempSrc: Oral Oral    SpO2: 95% 97% 97% 97%  Weight:      Height:        Intake/Output Summary (Last 24 hours) at 02/01/2020 0823 Last data filed at 02/01/2020 1017 Gross per 24 hour  Intake 2334 ml  Output 1550 ml  Net 784 ml   Filed Weights   01/27/20 2220  Weight: 86.2 kg    Examination:  General exam: awake, alert, no acute distress, obese HEENT: constantly moves tongue and lips at rest, dry mucus membranes, hearing grossly normal  Respiratory system: CTAB, no wheezes, rales or rhonchi, normal respiratory effort. Cardiovascular system: normal S1/S2, RRR, no pedal edema.   Gastrointestinal system: soft, NT, ND, +bowel sounds, PEG tube in place Central nervous system: unable to assess orientation as patient not verbal during encounter, follows only some commands (take deep breath, not squeeze my hands or tell me your name), no gross focal neurologic deficits Extremities: moves all, no cyanosis, normal tone Psychiatry: normal mood, congruent affect, judgement and insight appear normal    Data Reviewed: I have personally reviewed following labs and imaging studies  CBC: Recent Labs  Lab 01/27/20 2242 01/27/20 2242 01/28/20 0419 01/29/20 0319 01/30/20 0520 01/31/20 0302 02/01/20 0458  WBC 11.3*   < > 9.0 7.2 8.5 8.5 7.2  NEUTROABS 5.5  --   --   --   --   --   --   HGB 8.4*   < > 7.4* 7.2* 7.2* 7.5* 7.3*  HCT 29.5*   < > 25.0* 24.4* 24.4* 26.1* 24.6*  MCV 115.7*   < > 111.6* 111.4* 112.4* 114.0* 114.4*  PLT 446*   < > 393 394 434* 427* 364   < > = values in this interval not displayed.   Basic Metabolic Panel: Recent Labs  Lab 01/28/20 0419 01/29/20 0319 01/30/20 0520 01/31/20 0302 02/01/20 0458  NA 145 144 147* 149* 153*  K 3.3*  3.3* 3.3* 3.7 3.6  CL 108 114* 113* 117* 117*  CO2 '26 24 27 26 29  '$ GLUCOSE 108* 75 89 155* 143*  BUN 40* 28* 25* 19 13  CREATININE 0.43* 0.47* 0.49* 0.44* 0.40*  CALCIUM 11.6* 10.6* 11.1* 10.5* 10.2  MG 2.3  --   --   --  2.9*  PHOS 3.3  --   --   --   --    GFR: Estimated Creatinine Clearance: 90.8 mL/min (A) (by C-G formula based on SCr of 0.4 mg/dL (L)). Liver Function Tests: Recent Labs  Lab 01/27/20 2242 01/28/20 0419 01/29/20 0319  AST 51* 38 29  ALT 64* 65* 14  ALKPHOS 168* 145* 130*  BILITOT 0.5 0.5 0.5  PROT 7.9 7.0 6.5  ALBUMIN 2.3* 2.0* 2.1*   No results for input(s): LIPASE, AMYLASE in the last 168 hours. No results for input(s): AMMONIA in the last 168 hours. Coagulation Profile: No results for input(s): INR, PROTIME in the last 168 hours. Cardiac Enzymes: Recent Labs  Lab 02/01/20 0458  CKTOTAL 27*   BNP (last 3 results) No results for input(s): PROBNP in the last  8760 hours. HbA1C: No results for input(s): HGBA1C in the last 72 hours. CBG: Recent Labs  Lab 01/31/20 1702 01/31/20 2009 01/31/20 2319 02/01/20 0352 02/01/20 0754  GLUCAP 136* 126* 96 106* 130*   Lipid Profile: No results for input(s): CHOL, HDL, LDLCALC, TRIG, CHOLHDL, LDLDIRECT in the last 72 hours. Thyroid Function Tests: No results for input(s): TSH, T4TOTAL, FREET4, T3FREE, THYROIDAB in the last 72 hours. Anemia Panel: No results for input(s): VITAMINB12, FOLATE, FERRITIN, TIBC, IRON, RETICCTPCT in the last 72 hours. Sepsis Labs: Recent Labs  Lab 01/27/20 2323 01/28/20 0419 01/29/20 0319  PROCALCITON  --  0.28 0.20  LATICACIDVEN 1.7  --   --     Recent Results (from the past 240 hour(s))  Novel Coronavirus, NAA (hospital order; send-out to ref lab)     Status: None   Collection Time: 01/24/20  3:00 PM   Specimen: Nasopharyngeal Swab; Respiratory  Result Value Ref Range Status   SARS-CoV-2, NAA NOT DETECTED NOT DETECTED Final    Comment: (NOTE) This nucleic acid  amplification test was developed and its performance characteristics determined by [employer]. Nucleic acid amplification tests include RT-PCR and TMA. This test has not been FDA cleared or approved. This test has been authorized by FDA under an Emergency Use Authorization (EUA). This test is only authorized for the duration of time the declaration that circumstances exist justifying the authorization of the emergency use of in vitro diagnostic tests for detection of SARS-CoV-2 virus and/or diagnosis of COVID-19 infection under section 564(b)(1) of the Act, 21 U.S.C. 734LPF-7(T) (1), unless the authorization is terminated or revoked sooner. When diagnostic testing is negative, the possibility of a false negative result should be considered in the context of a patient's recent exposures and the presence of clinical signs and symptoms consistent with COVID-19. An individual without symptoms of COVID- 19 and who is not shedding SARS-CoV-2  virus would expect to have a negative (not detected) result in this assay. Performed At: [facility] [address] [place], [place] [id number] Rush Farmer MD GD:[id number]    [place]  Final    Comment: Performed at [facility], [place] [address], [place], [place] [address]  Blood Culture (routine x 2)     Status: None   Collection Time: 01/27/20 10:42 PM   Specimen: BLOOD  Result Value Ref Range Status   Specimen Description BLOOD BLOOD LEFT HAND  Final   Special Requests   Final    BOTTLES DRAWN AEROBIC AND ANAEROBIC Blood Culture adequate volume   Culture   Final    NO GROWTH 5 DAYS Performed at [facility], [address], [place], [place] [address]    Report Status 02/01/2020 FINAL  Final  Blood Culture (routine x 2)     Status: None   Collection Time: 01/27/20 10:42 PM   Specimen: BLOOD  Result Value Ref Range Status   Specimen Description BLOOD BLOOD LEFT HAND  Final    Special Requests   Final    BOTTLES DRAWN AEROBIC AND ANAEROBIC Blood Culture adequate volume   Culture   Final    NO GROWTH 5 DAYS Performed at [facility], [address], [place], [place] [address]    Report Status 02/01/2020 FINAL  Final  Respiratory Panel by RT PCR (Flu A&B, Covid) - Nasopharyngeal Swab     Status: None   Collection Time: 01/27/20 11:57 PM   Specimen: Nasopharyngeal Swab  Result Value Ref Range Status  SARS Coronavirus 2 by RT PCR NEGATIVE NEGATIVE Final    Comment: (NOTE) SARS-CoV-2 target nucleic acids are NOT DETECTED. The SARS-CoV-2 RNA is generally detectable in upper respiratoy specimens during the acute phase of infection. The lowest concentration of SARS-CoV-2 viral copies this assay can detect is 131 copies/mL. A negative result does not preclude SARS-Cov-2 infection and should not be used as the sole basis for treatment or other patient management decisions. A negative result may occur with  improper specimen collection/handling, submission of specimen other than nasopharyngeal swab, presence of viral mutation(s) within the areas targeted by this assay, and inadequate number of viral copies (<131 copies/mL). A negative result must be combined with clinical observations, patient history, and epidemiological information. The expected result is Negative. Fact Sheet for Patients:  PinkCheek.be Fact Sheet for Healthcare Providers:  GravelBags.it This test is not yet ap proved or cleared by the [place] FDA and  has been authorized for detection and/or diagnosis of SARS-CoV-2 by FDA under an Emergency Use Authorization (EUA). This EUA will remain  in effect (meaning this test can be used) for the duration of the COVID-19 declaration under Section 564(b)(1) of the Act, 21 U.S.C. section 360bbb-3(b)(1), unless the authorization is terminated or revoked sooner.    Influenza A by PCR  NEGATIVE NEGATIVE Final   Influenza B by PCR NEGATIVE NEGATIVE Final    Comment: (NOTE) The Xpert Xpress SARS-CoV-2/FLU/RSV assay is intended as an aid in  the diagnosis of influenza from Nasopharyngeal swab specimens and  should not be used as a sole basis for treatment. Nasal washings and  aspirates are unacceptable for Xpert Xpress SARS-CoV-2/FLU/RSV  testing. Fact Sheet for Patients: PinkCheek.be Fact Sheet for Healthcare Providers: GravelBags.it This test is not yet approved or cleared by the [place] FDA and  has been authorized for detection and/or diagnosis of SARS-CoV-2 by  FDA under an Emergency Use Authorization (EUA). This EUA will remain  in effect (meaning this test can be used) for the duration of the  Covid-19 declaration under Section 564(b)(1) of the Act, 21  U.S.C. section 360bbb-3(b)(1), unless the authorization is  terminated or revoked. Performed at [facility], [address] [place], [place] [address]   Aerobic/Anaerobic Culture (surgical/deep wound)     Status: None (Preliminary result)   Collection Time: 01/28/20  1:06 PM   Specimen: PATH Soft tissue  Result Value Ref Range Status   Specimen Description   Final    TISSUE Performed at [facility], [address], [place], [place] [address]    Special Requests   Final    NONE Performed at [facility], [address] [place], [place] [address]    Gram Stain   Final    RARE WBC PRESENT,BOTH PMN AND MONONUCLEAR NO ORGANISMS SEEN    Culture   Final    NO GROWTH 3 DAYS Performed at [facility], [place] [address], [place], [place] [address]    Report Status PENDING  Incomplete  Aerobic/Anaerobic Culture (surgical/deep wound)     Status: None (Preliminary result)   Collection Time: 01/28/20  1:54 PM   Specimen: Wound; Body Fluid  Result Value Ref Range Status   Specimen Description   Final     WOUND Performed at [facility], [address] [place], [place] [address]    Special Requests   Final    NONE Performed at [facility], [address] [place], [place] [address]  Gram Stain   Final    FEW WBC PRESENT, PREDOMINANTLY PMN NO ORGANISMS SEEN    Culture   Final    NO GROWTH 3 DAYS Performed at [facility] [address] [place], [place] [address]    Report Status PENDING  Incomplete  Aerobic/Anaerobic Culture (surgical/deep wound)     Status: None (Preliminary result)   Collection Time: 01/28/20  1:55 PM   Specimen: PATH Soft tissue  Result Value Ref Range Status   Specimen Description   Final    TISSUE Performed at [facility], [address] [place], [place] [address]    Special Requests   Final    NONE Performed at [facility], [address] [place], [place] [address]    Gram Stain   Final    RARE WBC PRESENT, PREDOMINANTLY PMN FEW GRAM POSITIVE COCCI IN PAIRS FEW GRAM VARIABLE ROD    Culture   Final    ABUNDANT ENTEROCOCCUS FAECIUM FEW CANDIDA ALBICANS HOLDING FOR POSSIBLE ANAEROBE Performed at [facility], [place] [address] [place], [place], [place] [address]    Report Status PENDING  Incomplete  Aerobic/Anaerobic Culture (surgical/deep wound)     Status: None (Preliminary result)   Collection Time: 01/28/20  2:17 PM   Specimen: PATH Bone biopsy; Tissue  Result Value Ref Range Status   Specimen Description   Final    BIOPSY Performed at [facility], [address] [place], [place] [address]    Special Requests   Final    NONE Performed at [facility], [address] [place], [place] [address]    Gram Stain   Final    NO WBC SEEN NO ORGANISMS SEEN Performed at [facility], [place] [address], [place], [place] [address]    Culture   Final    RARE ENTEROCOCCUS FAECIUM NO ANAEROBES ISOLATED; CULTURE IN PROGRESS FOR 5 DAYS VANCOMYCIN RESISTANT ENTEROCOCCUS  ISOLATED    Report Status PENDING  Incomplete   Organism ID, Bacteria ENTEROCOCCUS FAECIUM  Final      Susceptibility   Enterococcus faecium - MIC*    AMPICILLIN >=32 RESISTANT Resistant     VANCOMYCIN >=32 RESISTANT Resistant     GENTAMICIN SYNERGY SENSITIVE Sensitive     LINEZOLID 2 SENSITIVE Sensitive     * RARE ENTEROCOCCUS FAECIUM  MRSA PCR Screening     Status: None   Collection Time: 01/31/20  8:51 PM   Specimen: Nasal Mucosa; Nasopharyngeal  Result Value Ref Range Status   MRSA by PCR NEGATIVE NEGATIVE Final    Comment:        The GeneXpert MRSA Assay (FDA approved for NASAL specimens only), is one component of a comprehensive MRSA colonization surveillance program. It is not intended to diagnose MRSA infection nor to guide or monitor treatment for MRSA infections. Performed at [facility], [address] [place],  [address]          Radiology Studies: No results found.      Scheduled Meds: . amiodarone  100 mg Per Tube BID  . benztropine  0.5 mg Per Tube BID  . carbidopa-levodopa  1 tablet Per Tube BID  . diltiazem  30 mg Per Tube QID  . famotidine  20 mg Per Tube Daily  . feeding supplement (PRO-STAT SUGAR FREE 64)  30 mL Per Tube Daily  . fluconazole  200 mg Per Tube Q24H  . free water  100 mL Per Tube Q2H  .  hydrALAZINE  50 mg Per Tube BID  . insulin aspart  0-9 Units Subcutaneous Q4H  . ipratropium-albuterol  3 mL Nebulization TID  . metoprolol tartrate  12.5 mg Per Tube BID  . nutrition supplement (JUVEN)  1 packet Per Tube BID BM  . thiamine  200 mg Per Tube Daily   Continuous Infusions: . ampicillin-sulbactam (UNASYN) IV 3 g (02/01/20 0502)  . DAPTOmycin (CUBICIN)  IV 700 mg (01/31/20 2044)  . feeding supplement (JEVITY 1.5 CAL/FIBER) 1,000 mL (01/31/20 2152)     LOS: 4 days    Time spent: 35 minutes    Ezekiel Slocumb, DO [facility]   If 7PM-7AM, please contact  night-coverage [email or web address] 02/01/2020, 8:23 AM

## 2020-02-01 NOTE — Plan of Care (Signed)
  Problem: Elimination: Goal: Will not experience complications related to bowel motility Outcome: Progressing   Problem: Pain Managment: Goal: General experience of comfort will improve Outcome: Progressing   Problem: Education: Goal: Knowledge of General Education information will improve Description: Including pain rating scale, medication(s)/side effects and non-pharmacologic comfort measures Outcome: Not Progressing   Problem: Clinical Measurements: Goal: Will remain free from infection Outcome: Not Progressing Goal: Respiratory complications will improve Outcome: Not Progressing

## 2020-02-01 NOTE — Progress Notes (Signed)
Occupational Therapy Treatment Patient Details Name: Kyle [name] MRN: [id number] DOB: 05-17-1946 Today's Date: 02/01/2020    History of present illness Kyle [name] is a 74 y.o. male with medical history significant of COVID 11/02/2019.  CVA, prediabetes, PE around 2018 started on Xarelto, chronic tremors of bilateral hands, bipolar disorder, morbid obesity CKD HLD neuropathy chronic atrial fibrillation DM2 COPD, Parkinson disease. He has been in LTAC and was discharged to SNF on 01/25/20. Was febrile when arrived to SNF and sent here to ED.   OT comments  Pt was alert and trying better to communicate with delay in responses with MAX assist for hand to mouth for oral care and cleaning L hand that had old dried blood and NSG updated.  He grimaced in pain when crossing RUE over to LUE but attempted to assist with extra time for processing.  Max assist for washing face with HOH as well and only able to maintain for about 30 seconds before withdrawing hand back down.  Slight tremor noted in both hands as well as tightness and incomplete hand closure bilaterally.  Continue to rec SNF after discharge.  Follow Up Recommendations  SNF    Equipment Recommendations       Recommendations for Other Services      Precautions / Restrictions Precautions Precautions: Fall Restrictions Weight Bearing Restrictions: No       Mobility Bed Mobility                  Transfers                      Balance                                           ADL either performed or assessed with clinical judgement   ADL Overall ADL's : Needs assistance/impaired                                       General ADL Comments: Pt was alert and trying better to communicate with delay in responses with MAX assist for hand to mouth for oral care and cleaning L hand that had old dried blood and NSG updated.  He grimaced in pain when crossing RUE over to LUE but  attempted to assist with extra time for processing.  Max assist for washing face with HOH as well and only able to maintain for about 30 seconds before withdrawing hand back down.  Slight tremor noted in both hands as well as tightness and incomplete hand closure bilaterally.     Vision Baseline Vision/History: Wears glasses     Perception     Praxis      Cognition Arousal/Alertness: Awake/alert Behavior During Therapy: Flat affect Overall Cognitive Status: Impaired/Different from baseline                                 General Comments: no family present for this session.        Exercises     Shoulder Instructions       General Comments      Pertinent Vitals/ Pain       Pain Assessment: (pt grimacing with AAROM of BUEs and hands but not able  to rate with face scale for pain)  Home Living                                          Prior Functioning/Environment              Frequency  Min 1X/week        Progress Toward Goals  OT Goals(current goals can now be found in the care plan section)  Progress towards OT goals: Progressing toward goals  Acute Rehab OT Goals Patient Stated Goal: none stated OT Goal Formulation: Patient unable to participate in goal setting Time For Goal Achievement: 02/11/20 Potential to Achieve Goals: [place] Discharge plan remains appropriate;Frequency remains appropriate    Co-evaluation                 AM-PAC OT "6 Clicks" Daily Activity     Outcome Measure   Help from another person eating meals?: Total Help from another person taking care of personal grooming?: A Lot Help from another person toileting, which includes using toliet, bedpan, or urinal?: Total Help from another person bathing (including washing, rinsing, drying)?: Total Help from another person to put on and taking off regular upper body clothing?: Total Help from another person to put on and taking off regular  lower body clothing?: Total 6 Click Score: 7    End of Session    OT Visit Diagnosis: Other abnormalities of gait and mobility (R26.89);Muscle weakness (generalized) (M62.81);Pain Pain - Right/Left: (bilteral UEs) Pain - part of body: Arm   Activity Tolerance Patient tolerated treatment well   Patient Left in bed;with call bell/phone within reach;with bed alarm set;with family/visitor present   Nurse Communication          Time: 3329-5188 OT Time Calculation (min): 30 min  Charges: OT General Charges $OT Visit: 1 Visit OT Treatments $Self Care/Home Management : 23-37 mins  Chrys Racer, OTR/L, [place] ascom [phone] 02/01/20, 12:26 PM

## 2020-02-02 ENCOUNTER — Encounter
Admission: EM | Disposition: A | Discharge status: Skilled Nursing Facility | Payer: Self-pay | Source: Home / Self Care | Attending: Internal Medicine

## 2020-02-02 ENCOUNTER — Inpatient Hospital Stay: Payer: Medicare Other | Admitting: Certified Registered"

## 2020-02-02 HISTORY — PX: INCISION AND DRAINAGE OF WOUND: SHX1803

## 2020-02-02 HISTORY — PX: APPLICATION OF WOUND VAC: SHX5189

## 2020-02-02 LAB — AEROBIC/ANAEROBIC CULTURE W GRAM STAIN (SURGICAL/DEEP WOUND)
Culture: NO GROWTH
Gram Stain: NONE SEEN

## 2020-02-02 LAB — GLUCOSE, CAPILLARY
Glucose-Capillary: 101 mg/dL — ABNORMAL HIGH (ref 70–99)
Glucose-Capillary: 105 mg/dL — ABNORMAL HIGH (ref 70–99)
Glucose-Capillary: 106 mg/dL — ABNORMAL HIGH (ref 70–99)
Glucose-Capillary: 106 mg/dL — ABNORMAL HIGH (ref 70–99)
Glucose-Capillary: 70 mg/dL (ref 70–99)
Glucose-Capillary: 82 mg/dL (ref 70–99)
Glucose-Capillary: 83 mg/dL (ref 70–99)

## 2020-02-02 LAB — MAGNESIUM: Magnesium: 3 mg/dL — ABNORMAL HIGH (ref 1.7–2.4)

## 2020-02-02 LAB — BASIC METABOLIC PANEL
Anion gap: 6 (ref 5–15)
BUN: 23 mg/dL (ref 8–23)
CO2: 29 mmol/L (ref 22–32)
Calcium: 10.7 mg/dL — ABNORMAL HIGH (ref 8.9–10.3)
Chloride: 118 mmol/L — ABNORMAL HIGH (ref 98–111)
Creatinine, Ser: 0.38 mg/dL — ABNORMAL LOW (ref 0.61–1.24)
GFR calc Af Amer: >60 mL/min (ref >60)
GFR calc non Af Amer: >60 mL/min (ref >60)
Glucose, Bld: 125 mg/dL — ABNORMAL HIGH (ref 70–99)
Potassium: 4 mmol/L (ref 3.5–5.1)
Sodium: 153 mmol/L — ABNORMAL HIGH (ref 135–145)

## 2020-02-02 LAB — CBC
HCT: 28.4 % — ABNORMAL LOW (ref 39.0–52.0)
Hemoglobin: 8.2 g/dL — ABNORMAL LOW (ref 13.0–17.0)
MCH: 33.1 pg (ref 26.0–34.0)
MCHC: 28.9 g/dL — ABNORMAL LOW (ref 30.0–36.0)
MCV: 114.5 fL — ABNORMAL HIGH (ref 80.0–100.0)
Platelets: 389 10*3/uL (ref 150–400)
RBC: 2.48 MIL/uL — ABNORMAL LOW (ref 4.22–5.81)
RDW: 16.4 % — ABNORMAL HIGH (ref 11.5–15.5)
WBC: 7.4 10*3/uL (ref 4.0–10.5)
nRBC: 0 % (ref 0.0–0.2)

## 2020-02-02 SURGERY — APPLICATION, WOUND VAC
Anesthesia: General | Site: Buttocks

## 2020-02-02 MED ORDER — LIDOCAINE HCL (CARDIAC) PF 100 MG/5ML IV SOSY
PREFILLED_SYRINGE | INTRAVENOUS | Status: DC | PRN
  Administered 2020-02-02: 50 mg via INTRAVENOUS

## 2020-02-02 MED ORDER — ACETAMINOPHEN 10 MG/ML IV SOLN
INTRAVENOUS | Status: DC | PRN
  Administered 2020-02-02: 1000 mg via INTRAVENOUS

## 2020-02-02 MED ORDER — ONDANSETRON HCL 4 MG/2ML IJ SOLN: 4.0000 mg | Freq: Once | INTRAMUSCULAR | Status: DC | PRN

## 2020-02-02 MED ORDER — BUPIVACAINE-EPINEPHRINE 0.5% -1:200000 IJ SOLN
INTRAMUSCULAR | Status: DC | PRN
  Administered 2020-02-02: 20 mL

## 2020-02-02 MED ORDER — PROPOFOL 500 MG/50ML IV EMUL
INTRAVENOUS | Status: DC | PRN
  Administered 2020-02-02: 20 ug/kg/min via INTRAVENOUS

## 2020-02-02 MED ORDER — PROPOFOL 10 MG/ML IV BOLUS
INTRAVENOUS | Status: AC
  Filled 2020-02-02: qty 40

## 2020-02-02 MED ORDER — ACETAMINOPHEN 10 MG/ML IV SOLN
INTRAVENOUS | Status: AC
  Filled 2020-02-02: qty 100

## 2020-02-02 MED ORDER — DEXTROSE-NACL 5-0.45 % IV SOLN: INTRAVENOUS | Status: DC

## 2020-02-02 MED ORDER — BUPIVACAINE HCL (PF) 0.5 % IJ SOLN
INTRAMUSCULAR | Status: AC
  Filled 2020-02-02: qty 30

## 2020-02-02 MED ORDER — EPINEPHRINE PF 1 MG/ML IJ SOLN
INTRAMUSCULAR | Status: AC
  Filled 2020-02-02: qty 1

## 2020-02-02 MED ORDER — PROPOFOL 10 MG/ML IV BOLUS
INTRAVENOUS | Status: AC
  Filled 2020-02-02: qty 20

## 2020-02-02 MED ORDER — PHENYLEPHRINE HCL (PRESSORS) 10 MG/ML IV SOLN
INTRAVENOUS | Status: DC | PRN
  Administered 2020-02-02: 100 ug via INTRAVENOUS

## 2020-02-02 MED ORDER — FENTANYL CITRATE (PF) 100 MCG/2ML IJ SOLN: 25.0000 ug | INTRAMUSCULAR | Status: DC | PRN

## 2020-02-02 MED ORDER — DEXMEDETOMIDINE HCL 200 MCG/2ML IV SOLN
INTRAVENOUS | Status: DC | PRN
  Administered 2020-02-02 (×3): 4 ug via INTRAVENOUS
  Administered 2020-02-02: 8 ug via INTRAVENOUS
  Administered 2020-02-02: 4 ug via INTRAVENOUS

## 2020-02-02 SURGICAL SUPPLY — 41 items
APL PRP STRL LF DISP 70% ISPRP (MISCELLANEOUS)
BNDG COHESIVE 6X5 TAN STRL LF (GAUZE/BANDAGES/DRESSINGS) ×2 IMPLANT
BNDG GAUZE 4.5X4.1 6PLY STRL (MISCELLANEOUS) ×1 IMPLANT
CANISTER SUCT 1200ML W/VALVE (MISCELLANEOUS) ×3 IMPLANT
CANISTER WOUND CARE 500ML ATS (WOUND CARE) ×2 IMPLANT
CHLORAPREP W/TINT 26 (MISCELLANEOUS) ×1 IMPLANT
CNTNR SPEC 2.5X3XGRAD LEK (MISCELLANEOUS)
CONT SPEC 4OZ STER OR WHT (MISCELLANEOUS)
CONT SPEC 4OZ STRL OR WHT (MISCELLANEOUS)
CONTAINER SPEC 2.5X3XGRAD LEK (MISCELLANEOUS) ×1 IMPLANT
COVER WAND RF STERILE (DRAPES) ×1 IMPLANT
DRAIN PENROSE 1/4X12 LTX STRL (WOUND CARE) ×1 IMPLANT
DRAPE LAPAROTOMY 77X122 PED (DRAPES) ×3 IMPLANT
DRSG GAUZE FLUFF 36X18 (GAUZE/BANDAGES/DRESSINGS) ×3 IMPLANT
DRSG MEPILEX FLEX 3X3 (GAUZE/BANDAGES/DRESSINGS) ×2 IMPLANT
DRSG VAC ATS LRG SENSATRAC (GAUZE/BANDAGES/DRESSINGS) ×6 IMPLANT
ELECT REM PT RETURN 9FT ADLT (ELECTROSURGICAL) ×3
ELECTRODE REM PT RTRN 9FT ADLT (ELECTROSURGICAL) ×1 IMPLANT
GAUZE PACKING IODOFORM 1/2 (PACKING) ×2 IMPLANT
GAUZE SPONGE 4X4 12PLY STRL (GAUZE/BANDAGES/DRESSINGS) ×1 IMPLANT
GLOVE SURG SYN 7.0 (GLOVE) ×3 IMPLANT
GLOVE SURG SYN 7.0 PF PI (GLOVE) ×1 IMPLANT
GLOVE SURG SYN 7.5  E (GLOVE) ×3
GLOVE SURG SYN 7.5 E (GLOVE) ×1 IMPLANT
GLOVE SURG SYN 7.5 PF PI (GLOVE) ×1 IMPLANT
GOWN STRL REUS W/ TWL LRG LVL3 (GOWN DISPOSABLE) ×2 IMPLANT
GOWN STRL REUS W/TWL LRG LVL3 (GOWN DISPOSABLE) ×6
KIT TURNOVER KIT A (KITS) ×3 IMPLANT
LABEL OR SOLS (LABEL) ×3 IMPLANT
NEEDLE HYPO 22GX1.5 SAFETY (NEEDLE) ×3 IMPLANT
NS IRRIG 500ML POUR BTL (IV SOLUTION) ×3 IMPLANT
PACK BASIN MINOR ARMC (MISCELLANEOUS) ×3 IMPLANT
PAD ABD DERMACEA PRESS 5X9 (GAUZE/BANDAGES/DRESSINGS) ×1 IMPLANT
PULSAVAC PLUS IRRIG FAN TIP (DISPOSABLE) ×3
SOL PREP PVP 2OZ (MISCELLANEOUS) ×3
SOLUTION PREP PVP 2OZ (MISCELLANEOUS) ×1 IMPLANT
SPONGE LAP 18X18 RF (DISPOSABLE) ×6 IMPLANT
SWAB CULTURE AMIES ANAERIB BLU (MISCELLANEOUS) ×2 IMPLANT
SYR 20ML LL LF (SYRINGE) ×3 IMPLANT
SYR BULB IRRIG 60ML STRL (SYRINGE) ×3 IMPLANT
TIP FAN IRRIG PULSAVAC PLUS (DISPOSABLE) IMPLANT

## 2020-02-02 NOTE — Anesthesia Preprocedure Evaluation (Addendum)
Anesthesia Evaluation  Patient identified by MRN, date of birth, ID band Patient awake    Reviewed: Allergy & Precautions, H&P , NPO status , reviewed documented beta blocker date and time   History of Anesthesia Complications Negative for: history of anesthetic complications  Airway Mallampati: III  TM Distance: >3 FB Neck ROM: limited   Comment: S/P Trach Dental  (+) Chipped, Missing, Poor Dentition VERY poor dentition:   Pulmonary pneumonia,  Prolonged, complicated clinical course since COVID 12/20          Cardiovascular hypertension, Pt. on medications + dysrhythmias Atrial Fibrillation      Neuro/Psych PSYCHIATRIC DISORDERS Bipolar Disorder  Neuromuscular disease    GI/Hepatic Neg liver ROS, neg GERD  ,  Endo/Other  diabetes, Type 2, Oral Hypoglycemic Agents  Renal/GU Renal InsufficiencyRenal disease     Musculoskeletal   Abdominal   Peds  Hematology negative hematology ROS (+)   Anesthesia Other Findings      Reproductive/Obstetrics                            Anesthesia Physical  Anesthesia Plan  ASA: IV  Anesthesia Plan: General   Post-op Pain Management:    Induction: Intravenous  PONV Risk Score and Plan: Treatment may vary due to age or medical condition and TIVA  Airway Management Planned: Nasal Cannula and Natural Airway  Additional Equipment:   Intra-op Plan:   Post-operative Plan:   Informed Consent: I have reviewed the patients History and Physical, chart, labs and discussed the procedure including the risks, benefits and alternatives for the proposed anesthesia with the patient or authorized representative who has indicated his/her understanding and acceptance.       Plan Discussed with: CRNA  Anesthesia Plan Comments:        Anesthesia Quick Evaluation

## 2020-02-02 NOTE — Progress Notes (Signed)
[facility] SURGICAL PROGRESS NOTE  Hospital Day(s): 5.   Post op day(s): 3 Days Post-Op.   Interval History:  Patient seen and examined no acute events or new complaints overnight.  Patient does not provide much history; no complaints of pain Cultures growing vancomycin resistant enterococcus faecium; switched to Unasyn and Daptomycin No issues with wound vac; plan for vac change in OR today  Vital signs in last 24 hours: [min-max] current  Temp:  [98.2 F (36.8 C)-98.5 F (36.9 C)] 98.2 F (36.8 C) (03/12 0739) Pulse Rate:  [78-87] 82 (03/12 0739) Resp:  [16-18] 18 (03/12 0739) BP: (140-157)/(68-77) 144/77 (03/12 0739) SpO2:  [94 %-100 %] 98 % (03/12 0739)     Height: 5\' 9"  (175.3 cm) Weight: 86.2 kg BMI (Calculated): 28.05   Intake/Output last 2 shifts:  03/11 0701 - 03/12 0700 In: -  Out: 1000 [Urine:1000]   Physical Exam:  Constitutional: alert, cooperative and no distress  Respiratory: breathing non-labored at rest  Cardiovascular: regular rate and sinus rhythm  Integumentary: Wound vac to the sacrum and left trochanteric which are bridged together and then this is bridged further to the left anterior thigh, good seal.    Labs:  CBC Latest Ref Rng & Units 02/02/2020 02/01/2020 01/31/2020  WBC 4.0 - 10.5 K/uL 7.4 7.2 8.5  Hemoglobin 13.0 - 17.0 g/dL 8.2(L) 7.3(L) 7.5(L)  Hematocrit 39.0 - 52.0 % 28.4(L) 24.6(L) 26.1(L)  Platelets 150 - 400 K/uL 389 364 427(H)   CMP Latest Ref Rng & Units 02/01/2020 01/31/2020 01/30/2020  Glucose 70 - 99 mg/dL 03/31/2020) 902(I) 89  BUN 8 - 23 mg/dL 13 19 097(D)  Creatinine 0.61 - 1.24 mg/dL 53(G) 9.92(E) 2.68(T)  Sodium 135 - 145 mmol/L 153(H) 149(H) 147(H)  Potassium 3.5 - 5.1 mmol/L 3.6 3.7 3.3(L)  Chloride 98 - 111 mmol/L 117(H) 117(H) 113(H)  CO2 22 - 32 mmol/L 29 26 27   Calcium 8.9 - 10.3 mg/dL 4.19(Q 10.5(H) 11.1(H)  Total Protein 6.5 - 8.1 g/dL - - -  Total Bilirubin 0.3 - 1.2 mg/dL - - -  Alkaline Phos 38 - 126  U/L - - -  AST 15 - 41 U/L - - -  ALT 0 - 44 U/L - - -     Imaging studies: No new pertinent imaging studies   Assessment/Plan:  74 y.o. male 3 Days Post-Op s/p second debridement of sacral pressure ulcer and placement of wound VAC greater than 50 cm   - NPO; hold tube feedings  - Will plan on wound vac change on today in the OR with Dr 22.2; will discuss with family later today to obtain consent  - Adjust ABx per culture results; vancomycin resistant enterococcus faecium; now on Unasyn and Daptomycin  - Continue IVF resuscitation   - agree palliative care consult would be reasonable   - Continue frequent positioning changes, offloading, low air loss mattress   - further management per primary team   All of the above findings and recommendations were discussed with the medical team  -- 62, PA-C [facility] 02/02/2020, 9:09 AM [phone] M-F: 7am - 4pm

## 2020-02-02 NOTE — Progress Notes (Signed)
New referral for [facility] community Palliative to follow post discharge received from Palliative NP Harvest Dark. TOC Ginnie Russoli aware. Patient information give to referral. Thank you Dayna Barker BSN, RN, [facility] [facility] [employer] [phone]

## 2020-02-02 NOTE — Anesthesia Postprocedure Evaluation (Signed)
Anesthesia Post Note  Patient: Kyle [name]  Procedure(s) Performed: APPLICATION OF WOUND VAC (N/A Buttocks) IRRIGATION AND DEBRIDEMENT WOUND (N/A Buttocks)  Patient location during evaluation: PACU Anesthesia Type: General Level of consciousness: awake and alert Pain management: pain level controlled Vital Signs Assessment: post-procedure vital signs reviewed and stable Respiratory status: spontaneous breathing and respiratory function stable Cardiovascular status: stable Anesthetic complications: no     Last Vitals:  Vitals:   02/02/20 2110 02/02/20 2132  BP: 120/74 134/80  Pulse: 75 86  Resp: 13 17  Temp:  (!) 36.3 C  SpO2: 97% 100%    Last Pain:  Vitals:   02/02/20 2132  TempSrc: Oral  PainSc:                  Zena Amos K

## 2020-02-02 NOTE — Progress Notes (Signed)
Daily Progress Note   Patient Name: Kyle [name]       Date: 02/02/2020 DOB: 02-Oct-1946  Age: 74 y.o. MRN#: [id number] Attending Physician: Ezekiel Slocumb, DO Primary Care Physician: [facility] Date: 01/27/2020  Reason for Consultation/Follow-up: Establishing goals of care  Subjective: Patient more alert and interactive today - looks at me and mumbles a few words - does tell me "everything will be alright". Asked to squeeze my hand - very delayed, weak response.   Length of Stay: 5  Current Medications: Scheduled Meds:  . amiodarone  100 mg Per Tube BID  . benztropine  0.5 mg Per Tube BID  . carbidopa-levodopa  1 tablet Per Tube BID  . diltiazem  30 mg Per Tube QID  . famotidine  20 mg Per Tube Daily  . feeding supplement (PRO-STAT SUGAR FREE 64)  30 mL Per Tube Daily  . fluconazole  200 mg Per Tube Q24H  . free water  100 mL Per Tube Q2H  . hydrALAZINE  50 mg Per Tube BID  . insulin aspart  0-9 Units Subcutaneous Q4H  . ipratropium-albuterol  3 mL Nebulization TID  . metoprolol tartrate  12.5 mg Per Tube BID  . nutrition supplement (JUVEN)  1 packet Per Tube BID BM  . thiamine  200 mg Per Tube Daily    Continuous Infusions: . ampicillin-sulbactam (UNASYN) IV 3 g (02/02/20 0656)  . DAPTOmycin (CUBICIN)  IV 700 mg (02/02/20 0041)  . feeding supplement (JEVITY 1.5 CAL/FIBER) Stopped (02/02/20 0801)    PRN Meds: acetaminophen (TYLENOL) oral liquid 160 mg/5 mL, albuterol, ibuprofen, ondansetron **OR** ondansetron (ZOFRAN) IV, oxyCODONE, sodium chloride flush  Physical Exam Constitutional:      General: He is not in acute distress.    Appearance: He is ill-appearing.  Pulmonary:     Effort: Pulmonary effort is normal. No respiratory distress.  Skin:     General: Skin is warm and dry.  Neurological:     Mental Status: He is alert.     Comments: Does not answer orientation questions, delayed responses, follows some commands after significant delay             Vital Signs: BP (!) 144/77 (BP Location: Left Arm)   Pulse 82   Temp 98.2 F (36.8 C)   Resp 18   Ht '5\' 9"'$  (1.753 m)   Wt 86.2 kg   SpO2 98%   BMI 28.06 kg/m  SpO2: SpO2: 98 % O2 Device: O2 Device: Room Air O2 Flow Rate:    Intake/output summary:   Intake/Output Summary (Last 24 hours) at 02/02/2020 1212 Last data filed at 02/02/2020 0827 Gross per 24 hour  Intake --  Output 1000 ml  Net -1000 ml   LBM: Last BM Date: 02/01/20 Baseline Weight: Weight: 86.2 kg Most recent weight: Weight: 86.2 kg       Palliative Assessment/Data: PPS 30%    Flowsheet Rows     Most Recent Value  Intake Tab  Referral Department  Hospitalist  Unit at Time of Referral  Cardiac/Telemetry Unit  Palliative Care Primary Diagnosis  Sepsis/Infectious Disease  Date Notified  01/30/20  Palliative Care Type  New  Palliative care  Reason for referral  Clarify Goals of Care  Date of Admission  01/27/20  Date first seen by Palliative Care  01/31/20  # of days Palliative referral response time  1 Day(s)  # of days IP prior to Palliative referral  3  Clinical Assessment  Palliative Performance Scale Score  30%  Psychosocial & Spiritual Assessment  Palliative Care Outcomes  Patient/Family meeting held?  Yes  Who was at the meeting?  sister  Palliative Care Outcomes  Clarified goals of care, Provided psychosocial or spiritual support, Counseled regarding hospice      Patient Active Problem List   Diagnosis Date Noted  . Goals of care, counseling/discussion   . Palliative care by specialist   . 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 SIRS (systemic inflammatory response syndrome) ([place]) 01/28/2020  . HCAP (healthcare-associated pneumonia)  01/28/2020  . Sepsis ([place]) 01/28/2020  . Decubitus ulcer of sacral region, unstageable ([place])   . Acute on chronic respiratory failure with hypoxia ([place])   . COVID-19 virus infection   . Pneumonia due to COVID-19 virus   . Acute metabolic encephalopathy   . Chronic kidney disease, stage III (moderate)   . Chronic atrial fibrillation ([place])   . Syncope and collapse 08/30/2019  . Falls/very unsteady gait 08/29/2019  . Syncope 08/28/2019  . BPH (benign prostatic hyperplasia) 03/17/2018  . Dyslipidemia 03/17/2018  . Bipolar I disorder, current or most recent episode manic, with psychotic features ([place]) 03/16/2018  . History of stroke 03/16/2018  . Current use of anticoagulant therapy 03/16/2018  . History of pulmonary embolism 03/16/2018  . Bipolar 1 disorder, depressed ([place]) 02/03/2017  . Catatonia 01/26/2017  . Diabetes ([place]) 01/26/2017  . Suicide attempt ([place])   . SVT (supraventricular tachycardia) ([place])   . Noncompliance 09/26/2015  . RLS (restless legs syndrome) 08/08/2015  . Peripheral neuropathy ([place]) 08/08/2015  . Cerebrovascular disease 08/07/2015  . Bipolar I disorder depressed with melancholic features ([place]) 50/07/3817  . Essential tremor 08/01/2015  . Hypertension 04/17/2015    Palliative Care Assessment & Plan   HPI: 74 y.o. male  with past medical history of T2DM, CVA, chronic tremor of bilateral hands, PE in 2018 on xarelto, a fib, COPD, HLD, CKD, bipolar disorder, and COVID 11/02/19 followed by respiratory arrest that resulted in mechanical ventilation and eventual trach and peg placement admitted on 01/27/2020 with fever. Patient has just been transferred from [facility] to SNF a few days prior to admission. Patient had been decannulated at [facility]. Patient developed a sacral decubitus. On 3/7 patient went to the OR for debridement of ulcer. Patient is being treated for sepsis d/t sacral wound. Patient is receiving feeds through PEG - unable to tolerate PO diet at this time.  Albumin is 2.1. There is also concern for recurrent aspiration pneumonia. PMT consulted for [place].  Assessment: Met with patient's family members to include sister Joycelyn Schmid, daughter Lelon Frohlich, niece, and granddaughter.   First met in patient's room - patient was much more interactive with family - speaking, making jokes, raised both arms off the bed - held his cell phone to his ear to have a conversation with his girlfriend.   Then we relocated to conference room to discuss goals of care.  Reviewed patient's baseline status - prior to COVID infection, fully functional and lived independently.   We discussed his current illness and what it means in the larger context of his on-going co-morbidities.  Natural  disease trajectory and expectations at EOL were discussed. Reviewed patient's clinical condition since COVID infection. Discussed current issues - concern about large sacral wound that is unlikely to heal. Concerns about nutrition. Concerns about patient's level of function and not being able to return to prior level of independence.   I attempted to elicit values and goals of care important to the patient.  Family all agrees that patient would not want to live dependent on others for his daily cares.   The difference between aggressive medical intervention and comfort care was considered in light of the patient's goals of care. Detailed discussions describing continued aggressive interventions: return to rehab with therapies, wound care, nutrition through PEG with attempts to advance oral diet as tolerated. Discussed concerns about this path - concerns wound cannot heal d/t functional status and nutritional status. Discussed concerns for repeated hospitalizations - patient is at risk for repeated infections - wound, pneumonia. Discussed patient is having meaningful interactions today and there is opportunity for this to continue on this aggressive path but it is unlikely he will return to living  independently. We also discussed comfort focused care - focusing on ensuring patient is not suffering, keeping him comfortable and allowing the natural disease process to occur. Discussed involving hospice for this type of care.  While family does all agree patient would not want to live in a facility or dependent on others they feel that they are not at a point where they can elect full comfort care. They are encouraged by patient's interaction with them today. They would like some time to see how Mr. Islam does and see how things "play out". They understand the risks involved and family is thoughtful and reasonable in the way they approach decisions. They agree that if Mr Tullis does experience repeated hospitalizations they would elect a comfort focused approach to his care.   Hospice and Palliative Care services outpatient were explained and offered. Family agrees to palliative to follow at SNF.  Questions and concerns were addressed. The family was encouraged to call with questions or concerns.   Recommendations/Plan: Continue current care with plans to return to rehab facility Outpatient palliative to follow at facility - [facility] aware  Code Status: DNR  Prognosis:  Unable to determine  Discharge Planning: [place] for rehab with Palliative care service follow-up  Care plan was discussed with case manager, patient's family as listed above  Thank you for allowing the Palliative Medicine Team to assist in the care of this patient.   Time In: 1345 Time Out: 1530 Total Time 105 minutes Prolonged Time Billed  yes       Greater than 50%  of this time was spent counseling and coordinating care related to the above assessment and plan.  Juel Burrow, DNP, [facility] Palliative Medicine Team Team Phone # [phone]  Pager [phone]

## 2020-02-02 NOTE — Progress Notes (Signed)
Pt is to be NPO for surgical procedure.  PEG tube feed infusing at 60 mL/h throughout NOC shift.  Conferred with OR staff and surgeon to stop PEG tube feed (stopped at 0800).  Surgery will be delayed until sufficient time has passed to safely perform procedure as NPO.

## 2020-02-02 NOTE — Progress Notes (Signed)
Speech Language Pathology Treatment: Dysphagia  Patient Details Name: Kyle [name] MRN: [id number] DOB: 02/18/1946 Today's Date: 02/02/2020 Time: 3893-7342 SLP Time Calculation (min) (ACUTE ONLY): 29 min  Assessment / Plan / Recommendation Clinical Impression  Pt seen today for ongoing assessment of swallowing; dysphagia tx targeting stimulation of swallowing. Continued discussion w/ NSG and pt about the importance of frequent oral care for hygiene and stimulation of swallowing. Pt awakened easily, he greeted this SLP. He was verbal and answered SLP's basic y/n questions w/ additional few words/comments about the Golf found on TV to watch as the session began. Noted slower processing and verbal responses, but they appeared accurate. Pt required full feeding assistance; positioning upright assistance. Reviewed chart notes; ENT was able to assess and remove via direct view and Glide Scope a large mass of material was noted in pt's hypopharynx/Valleculae yesterday. Pt verbalized it felt "better" in his throat now. Noted continued involuntary lingual movements at rest. Upon attending to presented oral care and/or single ice chips, noted the lingual movements calmed. Post thorough oral care and moistening of mouth w/ oral gel, 3 single ice chips were given. Noted pt's immediate mastication of each chip and appropriate oral management of each chip. Pt appeared to maintain oral control of the trials; no anterior loss. Pt demonstrated fairly timely pharyngeal response/swallow w/ each trial; laryngeal excursion palpated. No overt, clinical s/s of aspiration noted - no decline in respiratory effort, no wet vocal quality b/t trials when assessed, no wet respirations. Pt stated the ice chips were "just as good as before". He endorsed wanting "more" but NSG informed this SLP that pt was pending surgery today so further ice chips were held. Pt's engagement w/ SLP was felt to be appropriate w/ pt exhibiting follow  through w/ basic instructions. He seemed pleased w/ eating ice chips and smiled when complimented by SLP on his progress so far. Recommend continue w/ frequent oral care for hygiene and stimulation of swallowing. Recommend Pleasure SINGLE ice chips w/ NSG staff post thorough oral care and positioning upright; aspiration precautions. NSG supervision for safe, oral intake of ice chips during the weekend. Education posted in room re: oral care and ice chips. It will be vital that skilled Dysphagia therapy and therapeutic po trials continue at discharge to SNF. MD/NSG updated. ST services will f/u on Monday for potential repeat of MBSS to reassess swallow oropharyngeal swallow function.    HPI HPI: Pt is a 74 y.o. male with medical history significant of Respiratory Arrest Jan. 1, 2021 requirig tracheostomy and PEG tube placement, (now decannulated); NPO status since Jan. 1, 2021; Covid 11/02/2019; CVA; prediabetes; PE around 2018 started on Xarelto; chronic tremors of bilateral hands; Bipolar Disorder; morbid Obesity; CKD; HLD; neuropathy; chronic atrial fibrillation; DM2; COPD; Parkinson disease. He has been in [place] since Jan. 2021, and was discharged to SNF on 01/25/2020. Was febrile when arrived to SNF and sent here to this ED on 01/27/2020.  Per pt's Sister, pt has not received skilled ST services for Dysphagia.      SLP Plan  Continue with current plan of care       Recommendations  Diet recommendations: NPO(w/ TFs via PEG; ice chips for Pleasure) Medication Administration: Via alternative means(PEG) Supervision: Full supervision/cueing for compensatory strategies Compensations: Minimize environmental distractions;Slow rate;Small sips/bites Postural Changes and/or Swallow Maneuvers: Seated upright 90 degrees;Upright 30-60 min after meal                General recommendations: (Dietician, Palliative Care  following) Oral Care Recommendations: Oral care QID;Staff/trained caregiver to provide  oral care Follow up Recommendations: Skilled Nursing facility SLP Visit Diagnosis: Dysphagia, oropharyngeal phase (R13.12);Cognitive communication deficit (E18.590) Plan: Continue with current plan of care       GO                 Kyle Som, MS, CCC-SLP Kyle [name] 02/02/2020, 4:37 PM

## 2020-02-02 NOTE — Progress Notes (Signed)
Physical Therapy Treatment Patient Details Name: Kyle [name] MRN: [id number] DOB: [birth year] Today's Date: 02/02/2020    History of Present Illness Kyle [name] is a 74 y.o. male with medical history significant of COVID 11/02/2019.  CVA, prediabetes, PE around 2018 started on Xarelto, chronic tremors of bilateral hands, bipolar disorder, morbid obesity CKD HLD neuropathy chronic atrial fibrillation DM2 COPD, Parkinson disease. He has been in [place] and was discharged to SNF on 01/25/20. Was febrile when arrived to SNF and sent here to ED.    PT Comments    Awake today attempting conversation asking where his sister is.  Some words difficult to understand.  Participated in exercises as described below.  Pt tolerates exercises well today. Pt does seem to assist with heel slides today but other ranges with limited to no AAROM.   Follow Up Recommendations  SNF     Equipment Recommendations       Recommendations for Other Services       Precautions / Restrictions Precautions Precautions: Fall Restrictions Weight Bearing Restrictions: No Other Position/Activity Restrictions: feeding tube, NPO    Mobility  Bed Mobility                  Transfers                    Ambulation/Gait                 Stairs             Wheelchair Mobility    Modified Rankin (Stroke Patients Only)       Balance                                            Cognition Arousal/Alertness: Awake/alert Behavior During Therapy: Flat affect Overall Cognitive Status: Within Functional Limits for tasks assessed                                        Exercises Other Exercises Other Exercises: 2 x 10 for ankle pumps, heel slides, ab/add, and slr AA/PROM    General Comments        Pertinent Vitals/Pain Pain Assessment: No/denies pain    Home Living                      Prior Function            PT Goals (current  goals can now be found in the care plan section) Progress towards PT goals: Progressing toward goals    Frequency    Min 2X/week      PT Plan Current plan remains appropriate    Co-evaluation              AM-PAC PT "6 Clicks" Mobility   Outcome Measure  Help needed turning from your back to your side while in a flat bed without using bedrails?: Total Help needed moving from lying on your back to sitting on the side of a flat bed without using bedrails?: Total Help needed moving to and from a bed to a chair (including a wheelchair)?: Total Help needed standing up from a chair using your arms (e.g., wheelchair or bedside chair)?: Total Help needed to walk in hospital room?: Total Help needed climbing 3-5  steps with a railing? : Total 6 Click Score: 6    End of Session   Activity Tolerance: Patient tolerated treatment well Patient left: in bed;with nursing/sitter in room Nurse Communication: Mobility status       Time: 0177-9390 PT Time Calculation (min) (ACUTE ONLY): 14 min  Charges:  $Therapeutic Exercise: 8-22 mins                    Danielle Dess, PTA 02/02/20, 10:45 AM

## 2020-02-02 NOTE — Transfer of Care (Signed)
Immediate Anesthesia Transfer of Care Note  Patient: Kyle [name]  Procedure(s) Performed: APPLICATION OF WOUND VAC (N/A Buttocks) IRRIGATION AND DEBRIDEMENT WOUND (N/A Buttocks)  Patient Location: PACU  Anesthesia Type:General  Level of Consciousness: awake, drowsy and patient cooperative  Airway & Oxygen Therapy: Patient Spontanous Breathing  Post-op Assessment: Report given to RN and Post -op Vital signs reviewed and stable  Post vital signs: Reviewed and stable  Last Vitals:  Vitals Value Taken Time  BP 97/70 02/02/20 2023  Temp    Pulse 70 02/02/20 2026  Resp 18 02/02/20 2026  SpO2 100 % 02/02/20 2026  Vitals shown include unvalidated device data.  Last Pain:  Vitals:   02/02/20 1000  TempSrc:   PainSc: 0-No pain         Complications: No apparent anesthesia complications

## 2020-02-02 NOTE — Brief Op Note (Signed)
02/02/2020  8:33 PM  PATIENT:  Marchia Bond Ruberg  74 y.o. male  PRE-OPERATIVE DIAGNOSIS:  stage IV decubitus  POST-OPERATIVE DIAGNOSIS:  stage IV decubitus  PROCEDURE:  Procedure(s): APPLICATION OF WOUND VAC (N/A) IRRIGATION AND DEBRIDEMENT WOUND (N/A)  SURGEON:  Surgeon(s) and Role:    * Delita Chiquito, MD - Primary  ANESTHESIA:   MAC  EBL:  2 mL   BLOOD ADMINISTERED:none  DRAINS: Has wound vac at sacral wound and left hip wound, bridged together, with suction disc located over left anterior thigh.  LOCAL MEDICATIONS USED:  BUPIVICAINE   SPECIMEN:  No Specimen  DISPOSITION OF SPECIMEN:  N/A  COUNTS:  YES  TOURNIQUET:  * No tourniquets in log *  DICTATION: .Dragon Dictation  PLAN OF CARE: return to floor  PATIENT DISPOSITION:  PACU - hemodynamically stable.   Delay start of Pharmacological VTE agent (>24hrs) due to surgical blood loss or risk of bleeding: yes

## 2020-02-02 NOTE — Progress Notes (Signed)
PROGRESS NOTE    Kyle [name]  MWN:[id number] DOB: [birth year] DOA: 01/27/2020  PCP: [facility]    LOS - 5   Brief Narrative:  Kyle [name] a 74 y.o.malewith medical history significant of COVID12/08/2019. CVA, prediabetes, PE around 2018 started on Xarelto, chronic tremors of bilateral hands, bipolar disorder, morbid obesity,CKD, HLD, neuropathy, chronic atrial fibrillation, DM2,COPD, and Parkinson disease.He presented withfever from SNF. He had been discharged from SNF after a 10 day inpatient stay for acute hypoxemic respiratory failure due to COVID-19. During this stay the patient was treated with Remdesivir, decadron, Rocephin and azithromycin.   In December patient was admitted for Covid with acute respiratory hypoxemic failure treated remdesivir 10 days of Decadron and Rocephin and azithromycin for possible bacterial superinfection in [place]. This was followed by a roughly three week stay at [facility]. This was followed by a three day stay at [facility].   His hospital stay in [place] was complicated by tachycardia patient was seen by cardiology started Cardizem metoprolol.The patient suffered respiratory arrest on 24 November 2019 and had to be intubated.He had an EEG showing slowing consistent with mild residual encephalopathy patient required tracheostomy and PEG tube placement. He later on developed pressure sacral decubitus ulcer which required debridement.He required broad-spectrum antibiotics IV vancomycin and Zosyn. He thentransferred to [name] in [place].His antibiotics were later simplified to cefepime. His trach was de cannulated.Patient remains nonverbal.In February he developed pneumonia worrisome for aspiration and was treated Unasyn.Hefailed trial of decannulation plan for him to have a ENT consult done to place an hour trach he had copious secretion requiring frequent suctioning as well as nasotracheal  suctioning.Patient was finally yesterday discharged from [place] to SNF3/02/2020, but continued to be febrile.As soon as he presented to facility and was transferred to [facility] emergency department today.  The patient was admitted to a telemetry bed. General surgery and Infectious disease were consulted, and the patient went for operative I & D on 01/28/2020. Cultures were taken. He is receiving IV  The patient has been admitted to a telemetry bed. General surgery has been consulted and has taken the patient for operative I&D of the sacral wound on 01/28/2020. Cultures were taken. Infectious disease has also been consulted.The patient is receiving IV Zosyn and Vancomycin. Nutrition has been consulted and the patient has been restarted on tube feeds. SLP has been consulted for swallow eval, although this has not yet taken place as the patient has been in procedures. Cultures have grown enterococcus and possible anaerobes. Infectious disease feels that it will be unlikely that these wounds will heal given the patient's poor nutritional and mobility status. He has recommended palliative care consult.Returned to the OR again3/36fr further debridement.  Subjective 3/12: Patient seen this AM, no acute events reported.  He spoke to me for the first time today.  Told me his name, his sister's name, knows he's in hospital.  He says "don't know what to tell you" when asked if anything bothering him.  He denies pain, fever/chills, nausea, chest pain or shortness of breath at this time.  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 Lactic acid was 1.7 and procalcitonin was 0.28. Initially this was felt to be due to pneumonia. However the patient had received treatment with Unasyn for pneumonia in February. CXR demonstrated persistent radiological evidence of pneumonia, but clinically the patient has no adventitious lung sounds, no cough, and he is saturating 98% on room air. However, CT does show evidence of Stage IV decubitus sacral ulcer with abscess and possible bone involvement. He has gone for I &D with general surgerytwice this admission. Patient had a very complex prolonged post Covid stay, led to trach/PEG, LTAC stay. No discharge summary but per records apparently was discharged on Augmentin with Midline in place no IV antibiotics were ordered at the time of discharge to SNF. Midline does not appear to be infected and functional.   Stage IV sacral decubitus ulcerwith abscess and possible bony involvement.  S/P I & D with general surgeryx 2.Will consult ID in the am. The patient is receiving IV Zosyn and Vancomycin. The patient has a history of shrapnel, sowould not be a good candidate for MRI.CRP elevated at 9.7. ESR is 126.Albumin 2.1. Culture has grown out Vanc resistant E. Faecium and possible anaerobe. --ID consulted, appreciate recommendations --Continue on IV Unasyn and Daptomycin  --unlikely sacral wound will heal with poor nutritional and functional status, palliative consulted --palliative is scheduled for family meeting on Friday at 2pm --general surgery following --wound vac change in OR planned for today 3/12  ?Recurrent aspiration pneumonia versus HCAP: ENT was able to remove a large piece of retained material from vallecula on 3/11. --Speech evaluations, ongoing --continue tube feeds by PEG tube  Dysphagia:mgmt as above  Hypernatremia: Na up to 153 today. Getting free water flushes by PEG tube. --increase  free water flushes to 100 cc q2h (from q4h) --recheck BMP in AM  Hypertension: Chronic. Continue home medications.  Severe protein calorie malnutrition: Nutrition consult appreciated. This severely impacts the patient's ability to heal these wounds.  Anemia: Likely due to chronic disease. Will check iron studies.  Chronic atrial fibrillation(HCC)-The patient has a CHADS2VASC score of >3. Resume eliquis when cleared by general surgery. Rate is well controlled on metoprolol and amiodarone which have been continued.  Type 2 Diabetes: Hb A1c is 4.2. Glucoses to be followed by FSBS and sensitive SSI. Oral antihyperglycemics have been held. He will be on a carbohydrate controlled diet and hypoglycemic protocol.  Parkinson disease: Noted. Likely playing a part in patient's history of aspiration. --ContinueCogentin, Sinemet  Encephalopathy:Status post hypoxic respiratory arrest requiring intubation in the setting of Covid in December 2020.Patient with some minimal improvements per family started to respond to his name at times but mainly remains nonverbal bedbound incontinent status post PEG tube. Lurline Idol has been decannulated perLTAC notes if patient has significant secretions may need to replace tracheostomy. At this point appears to be stable.    DVT prophylaxis: SCD's   Code Status: DNR  Family Communication: none at bedside during encounter, will call sister   Disposition Plan:  To SNF pending improvement and clearance by general surgery.  Palliative care meeting with family tomorrow at 2pm.  Patient awaiting wound vac change in OR with surgery today.  If no further surgical debridement required after today, likely patient can be discharged to rehab once wound vac and supplies available at facility. Coming From SNF Exp DC Date possibly this weekend, more likely Monday 3/15  Barriers as above Medically Stable for Discharge? No   Consultants:   General  Surgery  Infectious Disease  Palliative Care  Procedures:   Surgical debridement of sacral decubitus ulcer x 2  Antimicrobials:   Unasyn, Daptomycin    Objective: Vitals:   02/01/20 1931 02/01/20 2000 02/02/20 0525 02/02/20 0739  BP: 140/68  (!) 150/73 (!) 144/77  Pulse: 80  87 82  Resp: '18  17 18  '$ Temp: 98.3 F (36.8 C)  98.5 F (36.9 C) 98.2 F (36.8 C)  TempSrc:      SpO2: 94% 94% 98% 98%  Weight:      Height:        Intake/Output Summary (Last 24 hours) at 02/02/2020 0841 Last data filed at 02/02/2020 0827 Gross per 24 hour  Intake --  Output 1400 ml  Net -1400 ml   Filed Weights   01/27/20 2220  Weight: 86.2 kg    Examination:  General exam: awake, alert, no acute distress, [place] HEENT: constant tongue and lip movements at rest, dry mucus membranes, hearing grossly normal  Respiratory system: CTAB, no wheezes, rales or rhonchi, normal respiratory effort. Cardiovascular system: normal S1/S2, RRR, no pedal edema.   Gastrointestinal system: soft, NT, ND, no HSM felt, +bowel sounds.   Data Reviewed: I have personally reviewed following labs and imaging studies  CBC: Recent Labs  Lab 01/27/20 2242 01/27/20 2242 01/28/20 0419 01/29/20 0319 01/30/20 0520 01/31/20 0302 02/01/20 0458  WBC 11.3*   < > 9.0 7.2 8.5 8.5 7.2  NEUTROABS 5.5  --   --   --   --   --   --   HGB 8.4*   < > 7.4* 7.2* 7.2* 7.5* 7.3*  HCT 29.5*   < > 25.0* 24.4* 24.4* 26.1* 24.6*  MCV 115.7*   < > 111.6* 111.4* 112.4* 114.0* 114.4*  PLT 446*   < > 393 394 434* 427* 364   < > = values in this interval not displayed.   Basic Metabolic Panel: Recent Labs  Lab 01/28/20 0419 01/29/20 0319 01/30/20 0520 01/31/20 0302 02/01/20 0458  NA 145 144 147* 149* 153*  K 3.3* 3.3* 3.3* 3.7 3.6  CL 108 114* 113* 117* 117*  CO2 '26 24 27 26 29  '$ GLUCOSE 108* 75 89 155* 143*  BUN 40* 28* 25* 19 13  CREATININE 0.43* 0.47* 0.49* 0.44* 0.40*  CALCIUM 11.6* 10.6* 11.1* 10.5* 10.2  MG  2.3  --   --   --  2.9*  PHOS 3.3  --   --   --   --    GFR: Estimated Creatinine Clearance: 90.8 mL/min (A) (by C-G formula based on SCr of 0.4 mg/dL (L)). Liver Function Tests: Recent Labs  Lab 01/27/20 2242 01/28/20 0419 01/29/20 0319  AST 51* 38 29  ALT 64* 65* 14  ALKPHOS 168* 145* 130*  BILITOT 0.5 0.5 0.5  PROT 7.9 7.0 6.5  ALBUMIN 2.3* 2.0* 2.1*   No results for input(s): LIPASE, AMYLASE in the last 168 hours. No results for input(s): AMMONIA in the last 168 hours. Coagulation Profile: No results for input(s): INR, PROTIME in the last 168 hours. Cardiac Enzymes: Recent Labs  Lab 02/01/20 0458  CKTOTAL 27*   BNP (last 3 results) No results for input(s): PROBNP in the last 8760 hours. HbA1C: No results for input(s): HGBA1C in the last 72 hours. CBG: Recent Labs  Lab 02/01/20 1620 02/01/20 1933 02/01/20 2353 02/02/20 0526 02/02/20 0740  GLUCAP 120* 119* 101* 106* 105*   Lipid Profile: No results for input(s): CHOL, HDL, LDLCALC,  TRIG, CHOLHDL, LDLDIRECT in the last 72 hours. Thyroid Function Tests: No results for input(s): TSH, T4TOTAL, FREET4, T3FREE, THYROIDAB in the last 72 hours. Anemia Panel: No results for input(s): VITAMINB12, FOLATE, FERRITIN, TIBC, IRON, RETICCTPCT in the last 72 hours. Sepsis Labs: Recent Labs  Lab 01/27/20 2323 01/28/20 0419 01/29/20 0319  PROCALCITON  --  0.28 0.20  LATICACIDVEN 1.7  --   --     Recent Results (from the past 240 hour(s))  Novel Coronavirus, NAA (hospital order; send-out to ref lab)     Status: None   Collection Time: 01/24/20  3:00 PM   Specimen: Nasopharyngeal Swab; Respiratory  Result Value Ref Range Status   SARS-CoV-2, NAA NOT DETECTED NOT DETECTED Final    Comment: (NOTE) This nucleic acid amplification test was developed and its performance characteristics determined by [employer]. Nucleic acid amplification tests include RT-PCR and TMA. This test has not been FDA cleared or  approved. This test has been authorized by FDA under an Emergency Use Authorization (EUA). This test is only authorized for the duration of time the declaration that circumstances exist justifying the authorization of the emergency use of in vitro diagnostic tests for detection of SARS-CoV-2 virus and/or diagnosis of COVID-19 infection under section 564(b)(1) of the Act, 21 U.S.C. 449PNP-0(Y) (1), unless the authorization is terminated or revoked sooner. When diagnostic testing is negative, the possibility of a false negative result should be considered in the context of a patient's recent exposures and the presence of clinical signs and symptoms consistent with COVID-19. An individual without symptoms of COVID- 19 and who is not shedding SARS-CoV-2  virus would expect to have a negative (not detected) result in this assay. Performed At: [facility] [address] [place], [place] [id number] Rush Farmer MD BV:[id number]    [place]  Final    Comment: Performed at [facility], [place] [address], [place], [place] [address]  Blood Culture (routine x 2)     Status: None   Collection Time: 01/27/20 10:42 PM   Specimen: BLOOD  Result Value Ref Range Status   Specimen Description BLOOD BLOOD LEFT HAND  Final   Special Requests   Final    BOTTLES DRAWN AEROBIC AND ANAEROBIC Blood Culture adequate volume   Culture   Final    NO GROWTH 5 DAYS Performed at [facility], [address], [place], [place] [address]    Report Status 02/01/2020 FINAL  Final  Blood Culture (routine x 2)     Status: None   Collection Time: 01/27/20 10:42 PM   Specimen: BLOOD  Result Value Ref Range Status   Specimen Description BLOOD BLOOD LEFT HAND  Final   Special Requests   Final    BOTTLES DRAWN AEROBIC AND ANAEROBIC Blood Culture adequate volume   Culture   Final    NO GROWTH 5 DAYS Performed at [facility], [address], [name], [place]  [address]    Report Status 02/01/2020 FINAL  Final  Respiratory Panel by RT PCR (Flu A&B, Covid) - Nasopharyngeal Swab     Status: None   Collection Time: 01/27/20 11:57 PM   Specimen: Nasopharyngeal Swab  Result Value Ref Range Status   SARS Coronavirus 2 by RT PCR NEGATIVE NEGATIVE Final    Comment: (NOTE) SARS-CoV-2 target nucleic acids are NOT DETECTED. The SARS-CoV-2 RNA is generally detectable in upper respiratoy specimens during the acute phase of infection. The lowest concentration of SARS-CoV-2 viral copies this  assay can detect is 131 copies/mL. A negative result does not preclude SARS-Cov-2 infection and should not be used as the sole basis for treatment or other patient management decisions. A negative result may occur with  improper specimen collection/handling, submission of specimen other than nasopharyngeal swab, presence of viral mutation(s) within the areas targeted by this assay, and inadequate number of viral copies (<131 copies/mL). A negative result must be combined with clinical observations, patient history, and epidemiological information. The expected result is Negative. Fact Sheet for Patients:  PinkCheek.be Fact Sheet for Healthcare Providers:  GravelBags.it This test is not yet ap proved or cleared by the [place] FDA and  has been authorized for detection and/or diagnosis of SARS-CoV-2 by FDA under an Emergency Use Authorization (EUA). This EUA will remain  in effect (meaning this test can be used) for the duration of the COVID-19 declaration under Section 564(b)(1) of the Act, 21 U.S.C. section 360bbb-3(b)(1), unless the authorization is terminated or revoked sooner.    Influenza A by PCR NEGATIVE NEGATIVE Final   Influenza B by PCR NEGATIVE NEGATIVE Final    Comment: (NOTE) The Xpert Xpress SARS-CoV-2/FLU/RSV assay is intended as an aid in  the diagnosis of influenza from Nasopharyngeal  swab specimens and  should not be used as a sole basis for treatment. Nasal washings and  aspirates are unacceptable for Xpert Xpress SARS-CoV-2/FLU/RSV  testing. Fact Sheet for Patients: PinkCheek.be Fact Sheet for Healthcare Providers: GravelBags.it This test is not yet approved or cleared by the [place] FDA and  has been authorized for detection and/or diagnosis of SARS-CoV-2 by  FDA under an Emergency Use Authorization (EUA). This EUA will remain  in effect (meaning this test can be used) for the duration of the  Covid-19 declaration under Section 564(b)(1) of the Act, 21  U.S.C. section 360bbb-3(b)(1), unless the authorization is  terminated or revoked. Performed at [facility], [address] [place], [place] [address]   Aerobic/Anaerobic Culture (surgical/deep wound)     Status: None (Preliminary result)   Collection Time: 01/28/20  1:06 PM   Specimen: PATH Soft tissue  Result Value Ref Range Status   Specimen Description   Final    TISSUE Performed at [facility], [address], [place], [place] [address]    Special Requests   Final    NONE Performed at [facility], [address] [place], [place] [address]    Gram Stain   Final    RARE WBC PRESENT,BOTH PMN AND MONONUCLEAR NO ORGANISMS SEEN    Culture   Final    RARE ENTEROCOCCUS FAECIUM CULTURE REINCUBATED FOR BETTER GROWTH Performed at [facility], [place] [address], [place], [place] [address]    Report Status PENDING  Incomplete  Aerobic/Anaerobic Culture (surgical/deep wound)     Status: None (Preliminary result)   Collection Time: 01/28/20  1:54 PM   Specimen: Wound; Body Fluid  Result Value Ref Range Status   Specimen Description   Final    WOUND Performed at [facility], [address] [place], [place] [address]    Special Requests   Final    NONE Performed at [facility], [address] [place],  [address]    Gram Stain   Final    FEW WBC PRESENT, PREDOMINANTLY PMN NO ORGANISMS SEEN    Culture   Final    NO GROWTH 4 DAYS Performed at [place],  [place] [address]    Report Status PENDING  Incomplete  Aerobic/Anaerobic Culture (surgical/deep wound)     Status: None   Collection Time: 01/28/20  1:55 PM   Specimen: PATH Soft tissue  Result Value Ref Range Status   Specimen Description   Final    TISSUE Performed at [facility], [address], [place], [place] [address]    Special Requests   Final    NONE Performed at [facility], [address] [place], [place] [address]    Gram Stain   Final    RARE WBC PRESENT, PREDOMINANTLY PMN FEW GRAM POSITIVE COCCI IN PAIRS FEW GRAM VARIABLE ROD    Culture   Final    ABUNDANT ENTEROCOCCUS FAECIUM FEW CANDIDA ALBICANS FEW BACTEROIDES THETAIOTAOMICRON BETA LACTAMASE POSITIVE Performed at [facility], [place] [address] [place], [place] [address]    Report Status 02/01/2020 FINAL  Final   Organism ID, Bacteria ENTEROCOCCUS FAECIUM  Final      Susceptibility   Enterococcus faecium - MIC*    AMPICILLIN >=32 RESISTANT Resistant     VANCOMYCIN >=32 RESISTANT Resistant     GENTAMICIN SYNERGY SENSITIVE Sensitive     LINEZOLID 2 SENSITIVE Sensitive     * ABUNDANT ENTEROCOCCUS FAECIUM  Aerobic/Anaerobic Culture (surgical/deep wound)     Status: None (Preliminary result)   Collection Time: 01/28/20  2:17 PM   Specimen: PATH Bone biopsy; Tissue  Result Value Ref Range Status   Specimen Description   Final    BIOPSY Performed at [facility], [address] [place], [place] [address]    Special Requests   Final    NONE Performed at [facility], [address] [place], [place] [address]    Gram Stain   Final    NO WBC SEEN NO ORGANISMS SEEN Performed at [facility], [place] [address] [place], [place] [address]     Culture   Final    RARE ENTEROCOCCUS FAECIUM VANCOMYCIN RESISTANT ENTEROCOCCUS ISOLATED NO ANAEROBES ISOLATED; CULTURE IN PROGRESS FOR 5 DAYS    Report Status PENDING  Incomplete   Organism ID, Bacteria ENTEROCOCCUS FAECIUM  Final      Susceptibility   Enterococcus faecium - MIC*    AMPICILLIN >=32 RESISTANT Resistant     VANCOMYCIN >=32 RESISTANT Resistant     GENTAMICIN SYNERGY SENSITIVE Sensitive     LINEZOLID 2 SENSITIVE Sensitive     * RARE ENTEROCOCCUS FAECIUM  MRSA PCR Screening     Status: None   Collection Time: 01/31/20  8:51 PM   Specimen: Nasal Mucosa; Nasopharyngeal  Result Value Ref Range Status   MRSA by PCR NEGATIVE NEGATIVE Final    Comment:        The GeneXpert MRSA Assay (FDA approved for NASAL specimens only), is one component of a comprehensive MRSA colonization surveillance program. It is not intended to diagnose MRSA infection nor to guide or monitor treatment for MRSA infections. Performed at [facility], [address], [place], [place] [address]          Radiology Studies: No results found.      Scheduled Meds: . amiodarone  100 mg Per Tube BID  . benztropine  0.5 mg Per Tube BID  . carbidopa-levodopa  1 tablet Per Tube BID  . diltiazem  30 mg Per Tube QID  . famotidine  20 mg Per Tube Daily  . feeding supplement (PRO-STAT SUGAR FREE 64)  30 mL Per Tube  Daily  . fluconazole  200 mg Per Tube Q24H  . free water  100 mL Per Tube Q2H  . hydrALAZINE  50 mg Per Tube BID  . insulin aspart  0-9 Units Subcutaneous Q4H  . ipratropium-albuterol  3 mL Nebulization TID  . metoprolol tartrate  12.5 mg Per Tube BID  . nutrition supplement (JUVEN)  1 packet Per Tube BID BM  . thiamine  200 mg Per Tube Daily   Continuous Infusions: . ampicillin-sulbactam (UNASYN) IV 3 g (02/02/20 0656)  . DAPTOmycin (CUBICIN)  IV 700 mg (02/02/20 0041)  . feeding supplement (JEVITY 1.5 CAL/FIBER) Stopped (02/02/20 0801)     LOS: 5 days     Time spent: 20 minutes    Ezekiel Slocumb, DO [facility]   If 7PM-7AM, please contact night-coverage [email or web address] 02/02/2020, 8:41 AM

## 2020-02-02 NOTE — Progress Notes (Signed)
Pt off unit to OR

## 2020-02-02 NOTE — TOC Transition Note (Signed)
Transition of Care [facility]) - CM/SW Discharge Note   Patient Details  Name: Kyle [name] MRN: [id number] Date of Birth: Oct 13, 1946  Transition of Care [facility] CM/SW Contact:  Victorino Dike, RN Phone Number: 02/02/2020, 3:57 PM   Clinical Narrative:     Met with patient and family today.  Patient is alert today, able to communicate with difficulty.  Sister would like to see if [facility] can offer a bed for short term rehab.  Spoke with Army Melia and she is reviewing.     Barriers to Discharge: Continued Medical Work up   Patient Goals and CMS Choice     Choice offered to / list presented to : Sibling  Discharge Placement                       Discharge Plan and Services                                     Social Determinants of Health (SDOH) Interventions     Readmission Risk Interventions No flowsheet data found.

## 2020-02-02 NOTE — NC FL2 (Signed)
[place] LEVEL OF CARE SCREENING TOOL     IDENTIFICATION  Patient Name: Kyle [name] Birthdate: August 05, 1946 Sex: male Admission Date (Current Location): 01/27/2020  [place] and [place] Number:  Engineering geologist and Address:  [facility], [address] [place], [place] [address]      Provider Number: [id number]  Attending Physician Name and Address:  Ezekiel Slocumb, DO  Relative Name and Phone Number:  Dierdre Forth  [phone]    Current Level of Care: Hospital Recommended Level of Care: Nursing Facility Prior Approval Number:    Date Approved/Denied:   PASRR Number:    Discharge Plan: SNF    Current Diagnoses: Patient Active Problem List   Diagnosis Date Noted  . Goals of care, counseling/discussion   . Palliative care by specialist   . 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 SIRS (systemic inflammatory response syndrome) ([place]) 01/28/2020  . HCAP (healthcare-associated pneumonia) 01/28/2020  . Sepsis ([place]) 01/28/2020  . Decubitus ulcer of sacral region, unstageable ([place])   . Acute on chronic respiratory failure with hypoxia ([place])   . COVID-19 virus infection   . Pneumonia due to COVID-19 virus   . Acute metabolic encephalopathy   . Chronic kidney disease, stage III (moderate)   . Chronic atrial fibrillation ([place])   . Syncope and collapse 08/30/2019  . Falls/very unsteady gait 08/29/2019  . Syncope 08/28/2019  . BPH (benign prostatic hyperplasia) 03/17/2018  . Dyslipidemia 03/17/2018  . Bipolar I disorder, current or most recent episode manic, with psychotic features ([place]) 03/16/2018  . History of stroke 03/16/2018  . Current use of anticoagulant therapy 03/16/2018  . History of pulmonary embolism 03/16/2018  . Bipolar 1 disorder, depressed ([place]) 02/03/2017  . Catatonia 01/26/2017  . Diabetes ([place]) 01/26/2017  . Suicide attempt ([place])   . SVT (supraventricular  tachycardia) ([place])   . Noncompliance 09/26/2015  . RLS (restless legs syndrome) 08/08/2015  . Peripheral neuropathy ([place]) 08/08/2015  . Cerebrovascular disease 08/07/2015  . Bipolar I disorder depressed with melancholic features ([place]) 38/25/0539  . Essential tremor 08/01/2015  . Hypertension 04/17/2015    Orientation RESPIRATION BLADDER Height & Weight     Self, Time, Situation, Place  Normal Incontinent Weight: 86.2 kg Height:  5\' 9"  (175.3 cm)  BEHAVIORAL SYMPTOMS/MOOD NEUROLOGICAL BOWEL NUTRITION STATUS      Incontinent Diet(PEG tube, feeding pump)  AMBULATORY STATUS COMMUNICATION OF NEEDS Skin   Total Care Verbally Wound Vac, PU Stage and Appropriate Care                       Personal Care Assistance Level of Assistance  Bathing, Dressing, Feeding Bathing Assistance: Maximum assistance Feeding assistance: Maximum assistance Dressing Assistance: Maximum assistance     Functional Limitations Info  Sight, Speech Sight Info: Adequate(glasses) Hearing Info: Impaired(can communicate by mouthing words with minimal volume) Speech Info: Adequate    SPECIAL CARE FACTORS FREQUENCY  PT (By licensed PT), OT (By licensed OT), Speech therapy     PT Frequency: 5x a week OT Frequency: 5x a week     Speech Therapy Frequency: 5x a week      Contractures Contractures Info: Not present    Additional Factors Info  Code Status Code Status Info: DNR             Current Medications (02/02/2020):  This is the current hospital active medication list Current Facility-Administered Medications  Medication Dose Route Frequency Provider Last Rate Last Admin  . acetaminophen (TYLENOL) 160 MG/5ML solution 650 mg  650 mg Per Tube Q6H PRN [name], MD   650 mg at 02/01/20 1811  . albuterol (PROVENTIL) (2.5 MG/3ML) 0.083% nebulizer solution 2.5 mg  2.5 mg Nebulization Q4H PRN [name], MD      . amiodarone (PACERONE) tablet 100 mg  100 mg Per Tube BID [name], MD   100 mg at 02/01/20 2211  . Ampicillin-Sulbactam (UNASYN) 3 g in sodium chloride 0.9 % 100 mL IVPB  3 g Intravenous Q6H Mick Sell, MD 200 mL/hr at 02/02/20 1335 3 g at 02/02/20 1335  . benztropine (COGENTIN) tablet 0.5 mg  0.5 mg Per Tube BID [name], MD   0.5 mg at 02/01/20 2211  . carbidopa-levodopa (SINEMET IR) 25-100 MG per tablet immediate release 1 tablet  1 tablet Per Tube BID [name], MD   1 tablet at 02/01/20 2212  . DAPTOmycin (CUBICIN) 700 mg in sodium chloride 0.9 % IVPB  700 mg Intravenous Q2000 Mick Sell, MD 228 mL/hr at 02/02/20 0041 700 mg at 02/02/20 0041  . diltiazem (CARDIZEM) tablet 30 mg  30 mg Per Tube QID [name], MD   30 mg at 02/01/20 2211  . famotidine (PEPCID) tablet 20 mg  20 mg Per Tube Daily [name], MD   20 mg at 02/01/20 0900  . feeding supplement (JEVITY 1.5 CAL/FIBER) liquid 1,000 mL  1,000 mL Oral Continuous [name], MD   Stopped at 02/02/20 0801  . feeding supplement (PRO-STAT SUGAR FREE 64) liquid 30 mL  30 mL Per Tube Daily [name], MD   30 mL at 02/01/20 0910  . fluconazole (DIFLUCAN) 40 MG/ML suspension 200 mg  200 mg Per Tube Q24H Mick Sell, MD   200 mg at 02/01/20 1811  . free water 100 mL  100 mL Per Tube Q2H Esaw Grandchild A, DO   100 mL at 02/02/20 0657  . hydrALAZINE (APRESOLINE) tablet 50 mg  50 mg Per Tube BID [name], MD   50 mg at 02/01/20 2211  . ibuprofen (ADVIL) 100 MG/5ML suspension 600 mg  600 mg Per Tube Q8H PRN [name], MD      . insulin aspart (novoLOG) injection 0-9 Units  0-9 Units Subcutaneous Q4H [name], MD   1 Units at 02/01/20 0854  . ipratropium-albuterol (DUONEB) 0.5-2.5 (3) MG/3ML nebulizer solution 3 mL  3 mL Nebulization TID [name], MD   3 mL at 02/02/20 1332  . metoprolol tartrate (LOPRESSOR) tablet 12.5 mg  12.5 mg Per Tube BID [name], MD   12.5 mg at 02/01/20 2212  . nutrition  supplement (JUVEN) (JUVEN) powder packet 1 packet  1 packet Per Tube BID BM [name], MD   1 packet at 02/01/20 1443  . ondansetron (ZOFRAN) tablet 4 mg  4 mg Oral Q6H PRN [name], MD       Or  . ondansetron [facility]) injection 4 mg  4 mg Intravenous Q6H PRN [name], MD      . oxyCODONE (ROXICODONE) 5 MG/5ML solution 5 mg  5 mg Per Tube Q4H PRN [name], MD   5 mg at 01/31/20 0146  . sodium chloride flush (NS) 0.9 % injection 10-40 mL  10-40 mL Intracatheter PRN [name], MD   10 mL at 01/31/20 2213  . thiamine tablet 200 mg  200 mg Per Tube Daily Lady Gary,  Victorino Dike, MD   200 mg at 02/01/20 0900     Discharge Medications: Please see discharge summary for a list of discharge medications.  Relevant Imaging Results:  Relevant Lab Results:   Additional Information SS [id number], may need IV antibiotics at discharge  Shawn Route, RN

## 2020-02-03 LAB — BASIC METABOLIC PANEL
Anion gap: 8 (ref 5–15)
BUN: 17 mg/dL (ref 8–23)
CO2: 28 mmol/L (ref 22–32)
Calcium: 10.3 mg/dL (ref 8.9–10.3)
Chloride: 116 mmol/L — ABNORMAL HIGH (ref 98–111)
Creatinine, Ser: 0.42 mg/dL — ABNORMAL LOW (ref 0.61–1.24)
GFR calc Af Amer: >60 mL/min (ref >60)
GFR calc non Af Amer: >60 mL/min (ref >60)
Glucose, Bld: 128 mg/dL — ABNORMAL HIGH (ref 70–99)
Potassium: 3.8 mmol/L (ref 3.5–5.1)
Sodium: 152 mmol/L — ABNORMAL HIGH (ref 135–145)

## 2020-02-03 LAB — MAGNESIUM: Magnesium: 2.8 mg/dL — ABNORMAL HIGH (ref 1.7–2.4)

## 2020-02-03 LAB — AEROBIC/ANAEROBIC CULTURE W GRAM STAIN (SURGICAL/DEEP WOUND)

## 2020-02-03 LAB — CBC
HCT: 29.3 % — ABNORMAL LOW (ref 39.0–52.0)
Hemoglobin: 8.4 g/dL — ABNORMAL LOW (ref 13.0–17.0)
MCH: 32.9 pg (ref 26.0–34.0)
MCHC: 28.7 g/dL — ABNORMAL LOW (ref 30.0–36.0)
MCV: 114.9 fL — ABNORMAL HIGH (ref 80.0–100.0)
Platelets: 388 10*3/uL (ref 150–400)
RBC: 2.55 MIL/uL — ABNORMAL LOW (ref 4.22–5.81)
RDW: 16.1 % — ABNORMAL HIGH (ref 11.5–15.5)
WBC: 7 10*3/uL (ref 4.0–10.5)
nRBC: 0 % (ref 0.0–0.2)

## 2020-02-03 LAB — GLUCOSE, CAPILLARY
Glucose-Capillary: 128 mg/dL — ABNORMAL HIGH (ref 70–99)
Glucose-Capillary: 109 mg/dL — ABNORMAL HIGH (ref 70–99)
Glucose-Capillary: 125 mg/dL — ABNORMAL HIGH (ref 70–99)
Glucose-Capillary: 110 mg/dL — ABNORMAL HIGH (ref 70–99)
Glucose-Capillary: 93 mg/dL (ref 70–99)

## 2020-02-03 MED ORDER — FREE WATER
150.0000 mL | Status: DC
  Administered 2020-02-03 – 2020-02-05 (×22): 150 mL

## 2020-02-03 NOTE — Progress Notes (Addendum)
PROGRESS NOTE    Kyle [name]  FYB:[id number] DOB: 1946/10/11 DOA: 01/27/2020  PCP: [facility]    LOS - 6   Brief Narrative:  Kyle [name] a 74 y.o.malewith medical history significant of COVID12/08/2019. CVA, prediabetes, PE around 2018 started on Xarelto, chronic tremors of bilateral hands, bipolar disorder, morbid obesity,CKD, HLD, neuropathy, chronic atrial fibrillation, DM2,COPD, and Parkinson disease.He presented withfever from SNF. He had been discharged from SNF after a 10 day inpatient stay for acute hypoxemic respiratory failure due to COVID-19. During this stay the patient was treated with Remdesivir, decadron, Rocephin and azithromycin.   In December patient was admitted for Covid with acute respiratory hypoxemic failure treated remdesivir 10 days of Decadron and Rocephin and azithromycin for possible bacterial superinfection in [place]. This was followed by a roughly three week stay at [facility]. This was followed by a three day stay at [facility].   His hospital stay in [place] was complicated by tachycardia patient was seen by cardiology started Cardizem metoprolol.The patient suffered respiratory arrest on 24 November 2019 and had to be intubated.He had an EEG showing slowing consistent with mild residual encephalopathy patient required tracheostomy and PEG tube placement. He later on developed pressure sacral decubitus ulcer which required debridement.He required broad-spectrum antibiotics IV vancomycin and Zosyn. He thentransferred to [name] in [place].His antibiotics were later simplified to cefepime. His trach was de cannulated.Patient remains nonverbal.In February he developed pneumonia worrisome for aspiration and was treated Unasyn.Hefailed trial of decannulation plan for him to have a ENT consult done to place an hour trach he had copious secretion requiring frequent suctioning as well as nasotracheal  suctioning.Patient was finally yesterday discharged from [place] to SNF3/02/2020, but continued to be febrile.As soon as he presented to facility and was transferred to [facility] emergency department today.  The patient was admitted to a telemetry bed. General surgery and Infectious disease were consulted, and the patient went for operative I & D on 01/28/2020. Cultures were taken. He is receiving IV  The patient has been admitted to a telemetry bed. General surgery has been consulted and has taken the patient for operative I&D of the sacral wound on 01/28/2020. Cultures were taken. Infectious disease has also been consulted.The patient is receiving IV Zosyn and Vancomycin. Nutrition has been consulted and the patient has been restarted on tube feeds. SLP has been consulted for swallow eval, although this has not yet taken place as the patient has been in procedures. Cultures have grown enterococcus and possible anaerobes. Infectious disease feels that it will be unlikely that these wounds will heal given the patient's poor nutritional and mobility status. He has recommended palliative care consult.Returned to the OR again3/49fr further debridement, wound vac placed.  Taken back to OR for wound vac change and evaluation, further debridement of necrotic tissue was required.    Subjective 3/13: Patient seen examined this morning at bedside.  No acute events reported overnight.  He does speak to me very little today, less than yesterday.  He denies pain at this time.  Denies fevers or chills, abdominal pain, nausea or other acute complaints.  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 Lactic acid was 1.7 and procalcitonin was 0.28.  CT showed evidence of Stage IV decubitus sacral ulcer with abscess and possible bone involvement.  He has gone for I &D with general surgeryin the OR three times to date this admission.  Now with wound vac. Patient had a very complex prolonged post Covid stay, led to trach/PEG, LTAC stay.   Stage IV sacral decubitus ulcerwith abscess and possible bony involvement.  S/P I & D with general surgeryx 3.The patient is receiving IV Zosyn and Vancomycin. The patient has a history of shrapnel, sowould not be a good candidate for MRI.CRP elevated at 9.7. ESR is 126.Albumin 2.1. Culture has grown outVanc resistantE. Faecium and possible anaerobe. --ID consulted, appreciate recommendations --ContinueonIV Unasyn and Daptomycin  --unlikely sacral wound will heal with poor nutritional and functional status, palliative consulted --palliative is scheduled for family meeting on Friday at 2pm --general surgery following --wound vac change and further debridement on 3/12 --likely further exploration and debridement on Monday  ?Recurrent aspiration pneumonia versus HCAP: ENT was able to remove a large piece of retained material from vallecula on 3/11. --Speech evaluations, ongoing --continue tube feeds by PEG tube  Dysphagia:mgmt as above  Hypernatremia: Naup to 153today. Getting free waterflushes by PEG tube. Not improved with increased frequency of 100 cc flushes --increase free water flushes to 150 cc q2h (from q4h) --recheck BMP in AM  Hypertension: Chronic. Continue home medications.  Severe protein calorie malnutrition: Nutrition consult appreciated. This severely impacts the patient's ability to heal these wounds.  Anemia: Likely due to chronic disease. Will check iron studies.  Chronic atrial  fibrillation(HCC)-The patient has a CHADS2VASC score of >3. Resume eliquis when cleared by general surgery. Rate is well controlled on metoprolol and amiodarone which have been continued.  Type 2 Diabetes: Hb A1c is 4.2. Glucoses to be followed by FSBS and sensitive SSI. Oral antihyperglycemics have been held. He will be on a carbohydrate controlled diet and hypoglycemic protocol.  Parkinson disease: Noted. Likely playing a part in patient's history of aspiration. --ContinueCogentin, Sinemet  Encephalopathy:Status post hypoxic respiratory arrest requiring intubation in the setting of Covid in December 2020.Patient with some minimal improvements per family started to respond to his name at times but mainly remains nonverbal bedbound incontinent status post PEG tube. Lurline Idol has been decannulated perLTAC notes if patient has significant secretions may need to replace tracheostomy. At this point appears to be stable.    DVT prophylaxis:SCD's Code Status: DNR Family Communication:none at bedside during encounter, will call sister  Disposition Plan:To SNF pending improvement and clearance by general surgery. Surgery planning for another trip to OR to assess need for more debridement on Monday 3/15. , likely patient can be discharged to rehab once wound vac and supplies available at facility. Coming FromSNF Exp DC Dateearliest 3/16  Barriersas above Medically Stable for Discharge?No  Consultants:  General Surgery  Infectious Disease  Palliative Care  Procedures:  Surgical debridement of sacral decubitus ulcer x 2  Antimicrobials:  Unasyn, Daptomycin    Objective: Vitals:   02/03/20 0013 02/03/20 0519 02/03/20 0817 02/03/20 0853  BP: 127/66 139/78  (!) 161/77  Pulse: 64 76  84  Resp: _0 Temp: 98.5 F (36.9 C) 98.8 F (37.1 C)  98.3 F (36.8 C)  TempSrc: Oral Oral    SpO2: 98% 97% 96% 98%  Weight:      Height:  Intake/Output Summary (Last 24 hours) at 02/03/2020 0906 Last data filed at 02/02/2020 2100 Gross per 24 hour  Intake 1110.45 ml  Output 652 ml  Net 458.45 ml   Filed Weights   01/27/20 2220  Weight: 86.2 kg    Examination:  General exam: awake, alert, no acute distress, obese HEENT: very dry mucus membranes, hearing grossly normal, constant tongue and lip movements unchanged Respiratory system: CTAB, no wheezes, rales or rhonchi, normal respiratory effort. Cardiovascular system: normal S1/S2, RRR, no JVD, murmurs, rubs, gallops, no pedal edema.   Gastrointestinal system: soft, NT, PED tube in place    Data Reviewed: I have personally reviewed following labs and imaging studies  CBC: Recent Labs  Lab 01/27/20 2242 01/28/20 0419 01/30/20 0520 01/31/20 0302 02/01/20 0458 02/02/20 0831 02/03/20 0556  WBC 11.3*   < > 8.5 8.5 7.2 7.4 7.0  NEUTROABS 5.5  --   --   --   --   --   --   HGB 8.4*   < > 7.2* 7.5* 7.3* 8.2* 8.4*  HCT 29.5*   < > 24.4* 26.1* 24.6* 28.4* 29.3*  MCV 115.7*   < > 112.4* 114.0* 114.4* 114.5* 114.9*  PLT 446*   < > 434* 427* 364 389 388   < > = values in this interval not displayed.   Basic Metabolic Panel: Recent Labs  Lab 01/28/20 0419 01/29/20 0319 01/30/20 0520 01/31/20 0302 02/01/20 0458 02/02/20 0831 02/03/20 0556  NA 145   < > 147* 149* 153* 153* 152*  K 3.3*   < > 3.3* 3.7 3.6 4.0 3.8  CL 108   < > 113* 117* 117* 118* 116*  CO2 26   < > _0 GLUCOSE 108*   < > 89 155* 143* 125* 128*  BUN 40*   < > 25* _1 CREATININE 0.43*   < > 0.49* 0.44* 0.40* 0.38* 0.42*  CALCIUM 11.6*   < > 11.1* 10.5* 10.2 10.7* 10.3  MG 2.3  --   --   --  2.9* 3.0* 2.8*  PHOS 3.3  --   --   --   --   --   --    < > = values in this interval not displayed.   GFR: Estimated Creatinine Clearance: 90.8 mL/min (A) (by C-G formula based on SCr of 0.42 mg/dL (L)). Liver Function Tests: Recent Labs  Lab 01/27/20 2242 01/28/20 0419  01/29/20 0319  AST 51* 38 29  ALT 64* 65* 14  ALKPHOS 168* 145* 130*  BILITOT 0.5 0.5 0.5  PROT 7.9 7.0 6.5  ALBUMIN 2.3* 2.0* 2.1*   No results for input(s): LIPASE, AMYLASE in the last 168 hours. No results for input(s): AMMONIA in the last 168 hours. Coagulation Profile: No results for input(s): INR, PROTIME in the last 168 hours. Cardiac Enzymes: Recent Labs  Lab 02/01/20 0458  CKTOTAL 27*   BNP (last 3 results) No results for input(s): PROBNP in the last 8760 hours. HbA1C: No results for input(s): HGBA1C in the last 72 hours. CBG: Recent Labs  Lab 02/02/20 2022 02/02/20 2125 02/02/20 2352 02/03/20 0540 02/03/20 0854  GLUCAP 101* 83 106* 128* 109*   Lipid Profile: No results for input(s): CHOL, HDL, LDLCALC, TRIG, CHOLHDL, LDLDIRECT in the last 72 hours. Thyroid Function Tests: No results for input(s): TSH, T4TOTAL, FREET4, T3FREE, THYROIDAB in the last 72 hours. Anemia Panel: No results for input(s): VITAMINB12, FOLATE, FERRITIN, TIBC,  IRON, RETICCTPCT in the last 72 hours. Sepsis Labs: Recent Labs  Lab 01/27/20 2323 01/28/20 0419 01/29/20 0319  PROCALCITON  --  0.28 0.20  LATICACIDVEN 1.7  --   --     Recent Results (from the past 240 hour(s))  Novel Coronavirus, NAA (hospital order; send-out to ref lab)     Status: None   Collection Time: 01/24/20  3:00 PM   Specimen: Nasopharyngeal Swab; Respiratory  Result Value Ref Range Status   SARS-CoV-2, NAA NOT DETECTED NOT DETECTED Final    Comment: (NOTE) This nucleic acid amplification test was developed and its performance characteristics determined by [employer]. Nucleic acid amplification tests include RT-PCR and TMA. This test has not been FDA cleared or approved. This test has been authorized by FDA under an Emergency Use Authorization (EUA). This test is only authorized for the duration of time the declaration that circumstances exist justifying the authorization of the emergency use of  in vitro diagnostic tests for detection of SARS-CoV-2 virus and/or diagnosis of COVID-19 infection under section 564(b)(1) of the Act, 21 U.S.C. 712RFX-5(O) (1), unless the authorization is terminated or revoked sooner. When diagnostic testing is negative, the possibility of a false negative result should be considered in the context of a patient's recent exposures and the presence of clinical signs and symptoms consistent with COVID-19. An individual without symptoms of COVID- 19 and who is not shedding SARS-CoV-2  virus would expect to have a negative (not detected) result in this assay. Performed At: [facility] [address] [place], [place] [id number] Rush Farmer MD EB:[id number]    [place]  Final    Comment: Performed at [facility], [place] [address] [place], [place] [address]  Blood Culture (routine x 2)     Status: None   Collection Time: 01/27/20 10:42 PM   Specimen: BLOOD  Result Value Ref Range Status   Specimen Description BLOOD BLOOD LEFT HAND  Final   Special Requests   Final    BOTTLES DRAWN AEROBIC AND ANAEROBIC Blood Culture adequate volume   Culture   Final    NO GROWTH 5 DAYS Performed at [facility], [address] [place], [place] [address]    Report Status 02/01/2020 FINAL  Final  Blood Culture (routine x 2)     Status: None   Collection Time: 01/27/20 10:42 PM   Specimen: BLOOD  Result Value Ref Range Status   Specimen Description BLOOD BLOOD LEFT HAND  Final   Special Requests   Final    BOTTLES DRAWN AEROBIC AND ANAEROBIC Blood Culture adequate volume   Culture   Final    NO GROWTH 5 DAYS Performed at [facility], [address], [place], [place] [address]    Report Status 02/01/2020 FINAL  Final  Respiratory Panel by RT PCR (Flu A&B, Covid) - Nasopharyngeal Swab     Status: None   Collection Time: 01/27/20 11:57 PM   Specimen: Nasopharyngeal Swab  Result Value Ref Range Status    SARS Coronavirus 2 by RT PCR NEGATIVE NEGATIVE Final    Comment: (NOTE) SARS-CoV-2 target nucleic acids are NOT DETECTED. The SARS-CoV-2 RNA is generally detectable in upper respiratoy specimens during the acute phase of infection. The lowest concentration of SARS-CoV-2 viral copies this assay can detect is 131 copies/mL. A negative result does not preclude SARS-Cov-2 infection and should not be used as the sole basis for treatment or other patient management decisions. A negative result may occur  with  improper specimen collection/handling, submission of specimen other than nasopharyngeal swab, presence of viral mutation(s) within the areas targeted by this assay, and inadequate number of viral copies (<131 copies/mL). A negative result must be combined with clinical observations, patient history, and epidemiological information. The expected result is Negative. Fact Sheet for Patients:  PinkCheek.be Fact Sheet for Healthcare Providers:  GravelBags.it This test is not yet ap proved or cleared by the [place] FDA and  has been authorized for detection and/or diagnosis of SARS-CoV-2 by FDA under an Emergency Use Authorization (EUA). This EUA will remain  in effect (meaning this test can be used) for the duration of the COVID-19 declaration under Section 564(b)(1) of the Act, 21 U.S.C. section 360bbb-3(b)(1), unless the authorization is terminated or revoked sooner.    Influenza A by PCR NEGATIVE NEGATIVE Final   Influenza B by PCR NEGATIVE NEGATIVE Final    Comment: (NOTE) The Xpert Xpress SARS-CoV-2/FLU/RSV assay is intended as an aid in  the diagnosis of influenza from Nasopharyngeal swab specimens and  should not be used as a sole basis for treatment. Nasal washings and  aspirates are unacceptable for Xpert Xpress SARS-CoV-2/FLU/RSV  testing. Fact Sheet for Patients: PinkCheek.be Fact  Sheet for Healthcare Providers: GravelBags.it This test is not yet approved or cleared by the [place] FDA and  has been authorized for detection and/or diagnosis of SARS-CoV-2 by  FDA under an Emergency Use Authorization (EUA). This EUA will remain  in effect (meaning this test can be used) for the duration of the  Covid-19 declaration under Section 564(b)(1) of the Act, 21  U.S.C. section 360bbb-3(b)(1), unless the authorization is  terminated or revoked. Performed at [facility], [address], [place], [place] [address]   Aerobic/Anaerobic Culture (surgical/deep wound)     Status: None (Preliminary result)   Collection Time: 01/28/20  1:06 PM   Specimen: PATH Soft tissue  Result Value Ref Range Status   Specimen Description   Final    TISSUE Performed at [facility], [address] [place], [place] [address]    Special Requests   Final    NONE Performed at [facility], [address] [place], [place] [address]    Gram Stain   Final    RARE WBC PRESENT,BOTH PMN AND MONONUCLEAR NO ORGANISMS SEEN    Culture   Final    RARE ENTEROCOCCUS FAECIUM SUSCEPTIBILITIES TO FOLLOW NO ANAEROBES ISOLATED Performed at [facility], [place] [address], [place], [place] [address]    Report Status PENDING  Incomplete  Aerobic/Anaerobic Culture (surgical/deep wound)     Status: None   Collection Time: 01/28/20  1:54 PM   Specimen: Wound; Body Fluid  Result Value Ref Range Status   Specimen Description   Final    WOUND Performed at [facility], [address] [place], [place] [address]    Special Requests   Final    NONE Performed at [facility], [address] [place], [place] [address]    Gram Stain   Final    FEW WBC PRESENT, PREDOMINANTLY PMN NO ORGANISMS SEEN    Culture   Final    No growth aerobically or anaerobically. Performed at [facility], [place] [address]  [place], [place] [address]    Report Status 02/02/2020 FINAL  Final  Aerobic/Anaerobic Culture (surgical/deep wound)     Status: None   Collection Time: 01/28/20  1:55 PM   Specimen: PATH Soft  tissue  Result Value Ref Range Status   Specimen Description   Final    TISSUE Performed at [facility], [address], [place], [place] [address]    Special Requests   Final    NONE Performed at [facility], [address] [place], [place] [address]    Gram Stain   Final    RARE WBC PRESENT, PREDOMINANTLY PMN FEW GRAM POSITIVE COCCI IN PAIRS FEW GRAM VARIABLE ROD    Culture   Final    ABUNDANT ENTEROCOCCUS FAECIUM FEW CANDIDA ALBICANS FEW BACTEROIDES THETAIOTAOMICRON BETA LACTAMASE POSITIVE Performed at [facility], [place] [address], [place], [place] [address]    Report Status 02/01/2020 FINAL  Final   Organism ID, Bacteria ENTEROCOCCUS FAECIUM  Final      Susceptibility   Enterococcus faecium - MIC*    AMPICILLIN >=32 RESISTANT Resistant     VANCOMYCIN >=32 RESISTANT Resistant     GENTAMICIN SYNERGY SENSITIVE Sensitive     LINEZOLID 2 SENSITIVE Sensitive     * ABUNDANT ENTEROCOCCUS FAECIUM  Aerobic/Anaerobic Culture (surgical/deep wound)     Status: None   Collection Time: 01/28/20  2:17 PM   Specimen: PATH Bone biopsy; Tissue  Result Value Ref Range Status   Specimen Description   Final    BIOPSY Performed at [facility], [address] [place], [place] [address]    Special Requests   Final    NONE Performed at [facility], [place], [place] [address]    Gram Stain NO WBC SEEN NO ORGANISMS SEEN   Final   Culture   Final    RARE VANCOMYCIN RESISTANT ENTEROCOCCUS ISOLATED NO ANAEROBES ISOLATED Performed at [facility], [place] [address], [place], [place] [address]    Report Status 02/02/2020 FINAL  Final   Organism ID, Bacteria VANCOMYCIN RESISTANT ENTEROCOCCUS ISOLATED  Final      Susceptibility    Vancomycin resistant enterococcus isolated - MIC*    AMPICILLIN >=32 RESISTANT Resistant     VANCOMYCIN >=32 RESISTANT Resistant     GENTAMICIN SYNERGY SENSITIVE Sensitive     LINEZOLID 2 SENSITIVE Sensitive     * RARE VANCOMYCIN RESISTANT ENTEROCOCCUS ISOLATED  MRSA PCR Screening     Status: None   Collection Time: 01/31/20  8:51 PM   Specimen: Nasal Mucosa; Nasopharyngeal  Result Value Ref Range Status   MRSA by PCR NEGATIVE NEGATIVE Final    Comment:        The GeneXpert MRSA Assay (FDA approved for NASAL specimens only), is one component of a comprehensive MRSA colonization surveillance program. It is not intended to diagnose MRSA infection nor to guide or monitor treatment for MRSA infections. Performed at [facility], [address] [place], [place] [address]          Radiology Studies: No results found.      Scheduled Meds: . amiodarone  100 mg Per Tube BID  . benztropine  0.5 mg Per Tube BID  . carbidopa-levodopa  1 tablet Per Tube BID  . diltiazem  30 mg Per Tube QID  . famotidine  20 mg Per Tube Daily  . feeding supplement (PRO-STAT SUGAR FREE 64)  30 mL Per Tube Daily  . fluconazole  200 mg Per Tube Q24H  . free water  150 mL Per Tube Q2H  . hydrALAZINE  50 mg Per Tube BID  . insulin aspart  0-9 Units Subcutaneous Q4H  . ipratropium-albuterol  3 mL Nebulization TID  . metoprolol tartrate  12.5 mg Per Tube BID  . nutrition supplement (JUVEN)  1 packet Per Tube BID BM  . thiamine  200 mg Per Tube Daily   Continuous Infusions: . ampicillin-sulbactam (UNASYN) IV 3 g (02/03/20 0545)  . DAPTOmycin (CUBICIN)  IV Stopped (02/02/20 0122)  . dextrose 5 % and 0.45% NaCl 75 mL/hr at 02/03/20 0526  . feeding supplement (JEVITY 1.5 CAL/FIBER) 1,000 mL (02/03/20 0532)     LOS: 6 days    Time spent: 20 minutes    Ezekiel Slocumb, DO [facility]   If 7PM-7AM, please contact night-coverage [email or web address] 02/03/2020, 9:06 AM

## 2020-02-03 NOTE — Progress Notes (Signed)
Subjective:  CC: Kyle [name] is a 74 y.o. male  Hospital stay day 6, 1 Day Post-Op sacral left trochanteric decub debridement  HPI: No acute issues reported overnight  ROS:  Unable to obtain secondary to patient status  Objective:   Temp:  [97.4 F (36.3 C)-99.4 F (37.4 C)] 97.9 F (36.6 C) (03/13 1241) Pulse Rate:  [64-88] 80 (03/13 1241) Resp:  [13-25] 16 (03/13 1241) BP: (97-161)/(52-80) 130/79 (03/13 1241) SpO2:  [96 %-100 %] 99 % (03/13 1241)     Height: 5\' 9"  (175.3 cm) Weight: 86.2 kg BMI (Calculated): 28.05   Intake/Output this shift:   Intake/Output Summary (Last 24 hours) at 02/03/2020 1526 Last data filed at 02/02/2020 2100 Gross per 24 hour  Intake 1110.45 ml  Output 652 ml  Net 458.45 ml    Constitutional :  alert, cooperative, appears stated age and no distress  Respiratory:  clear to auscultation bilaterally  Cardiovascular:  regular rate and rhythm  Gastrointestinal: soft, non-tender; bowel sounds normal; no masses,  no organomegaly.  PEG tube in place with tube feedings.  Skin: Cool and moist.  Wound VAC intact over sacral and left trochanteric wound with minimal serosanguineous output  Psychiatric: Normal affect, non-agitated, not confused       LABS:  CMP Latest Ref Rng & Units 02/03/2020 02/02/2020 02/01/2020  Glucose 70 - 99 mg/dL 04/02/2020) 619(J) 093(O)  BUN 8 - 23 mg/dL 17 23 13   Creatinine 0.61 - 1.24 mg/dL 671(I) ) 4.58(K)  Sodium 135 - 145 mmol/L 152(H) 153(H) 153(H)  Potassium 3.5 - 5.1 mmol/L 3.8 4.0 3.6  Chloride 98 - 111 mmol/L 116(H) 118(H) 117(H)  CO2 22 - 32 mmol/L 28 29 29   Calcium 8.9 - 10.3 mg/dL 9.98(P 10.7(H) 10.2  Total Protein 6.5 - 8.1 g/dL - - -  Total Bilirubin 0.3 - 1.2 mg/dL - - -  Alkaline Phos 38 - 126 U/L - - -  AST 15 - 41 U/L - - -  ALT 0 - 44 U/L - - -   CBC Latest Ref Rng & Units 02/03/2020 02/02/2020 02/01/2020  WBC 4.0 - 10.5 K/uL 7.0 7.4 7.2  Hemoglobin 13.0 - 17.0 g/dL 02/05/2020) 04/03/2020) 7.3(L)  Hematocrit 39.0  - 52.0 % 29.3(L) 28.4(L) 24.6(L)  Platelets 150 - 400 K/uL 388 389 364    RADS: n/a Assessment:   sacral left trochanteric decub debridement.  Stable and wound VAC intact.  Plan is for second look operation in 2 days.  Continue current management until then.

## 2020-02-03 NOTE — Op Note (Addendum)
  Procedure Date:  02/02/2020  Pre-operative Diagnosis:  Sacral, Left Trochanteric, and Left Ischial decubitus wounds  Post-operative Diagnosis:  Sacral, Left Trochanteric, and Left Ischial decubitus wounds  Procedure:   1.  Debridement of sacral wound, including skin and subcutaneous tissue for 10 cm2 2.  Debridement of left trochanteric wound, including subcutaneous tissue, fascia, and muscle for 628 cm2. 3.  Application of negative pressure dressing over both sacral and left trochanteric wounds, > 50 cm2.  Surgeon:  Melvyn Neth, MD  Anesthesia:  MAC  Estimated Blood Loss:  5 ml  Specimens:  None  Complications:  None  Measurements:  Sacral wound overall now measures 18 x 12 cm in largest dimensions.  Left trochanteric wound measures 11 x 7 cm in largest dimensions.  Left ischial wound measures about 3 x 2 cm.  Indications for Procedure:  This is a 74 y.o. male with diagnosis of extensive sacral and left trochanteric decubitus wounds, s/p prior debridements on 3/7 and 3/9, now presenting for wound exploration, possible debridement, and reapplication of wound vac dressing.  The risks of bleeding, abscess or infection, injury to surrounding structures, and need for further procedures were all discussed with the patient and his sister, and were willing to proceed.  Description of Procedure: The patient was correctly identified in the preoperative area and brought into the operating room.  The patient was placed supine with VTE prophylaxis in place.  Appropriate time-outs were performed.  Anesthesia was induced.  Then patient was placed in right lateral decubitus position.  Appropriate antibiotics were infused.  The patient's wound vac dressing was removed and the left hip and buttocks areas were prepped and draped in usual sterile fashion.  The sacral wound had some areas of necrosis at the superior aspect of the wound, and this was debrided using cautery for an area of 2 x 5 cm.   There was minimal additional necrosis on the left lateral edge of the wound which was also debrided the cautery.  We then proceeded to the left hip area, and the overlying muscle fascia appeared necrotic.  This was debrided and noted to have necrotic underlying muscle as well, particularly over the superior half of the wound.  The necrosis extended superiorly with some undermining underneath the superior skin edge. This was also debrided with cautery.  Total area debrided in the left wound was 8 x 16.5 cm.  After all debridement was completed, the wounds were thoroughly irrigated using Pulse lavage.  Then, wound vac dressing was applied over both wounds, bridging between them with sponge, and bridging anteriorly to the left anterior thigh where the suction disc was placed.  This was then connected to 125 mmHg continuous low intensity suction with good seal and no leak noted.  The left ischial wound was healing well, and was re-dressed with 1/2 inch iodoform and mepilex dressing.  The patient was then turned supine, emerged from anesthesia, and brought to the recovery room for further management.  The patient tolerated the procedure well and all counts were correct at the end of the case.   Melvyn Neth, MD

## 2020-02-04 LAB — CBC
HCT: 28.3 % — ABNORMAL LOW (ref 39.0–52.0)
Hemoglobin: 8.3 g/dL — ABNORMAL LOW (ref 13.0–17.0)
MCH: 32.8 pg (ref 26.0–34.0)
MCHC: 29.3 g/dL — ABNORMAL LOW (ref 30.0–36.0)
MCV: 111.9 fL — ABNORMAL HIGH (ref 80.0–100.0)
Platelets: 346 10*3/uL (ref 150–400)
RBC: 2.53 MIL/uL — ABNORMAL LOW (ref 4.22–5.81)
RDW: 15.9 % — ABNORMAL HIGH (ref 11.5–15.5)
WBC: 7.6 10*3/uL (ref 4.0–10.5)
nRBC: 0 % (ref 0.0–0.2)

## 2020-02-04 LAB — BASIC METABOLIC PANEL
Anion gap: 8 (ref 5–15)
BUN: 19 mg/dL (ref 8–23)
CO2: 29 mmol/L (ref 22–32)
Calcium: 10.2 mg/dL (ref 8.9–10.3)
Chloride: 114 mmol/L — ABNORMAL HIGH (ref 98–111)
Creatinine, Ser: 0.45 mg/dL — ABNORMAL LOW (ref 0.61–1.24)
GFR calc Af Amer: >60 mL/min (ref >60)
GFR calc non Af Amer: >60 mL/min (ref >60)
Glucose, Bld: 117 mg/dL — ABNORMAL HIGH (ref 70–99)
Potassium: 4.2 mmol/L (ref 3.5–5.1)
Sodium: 151 mmol/L — ABNORMAL HIGH (ref 135–145)

## 2020-02-04 LAB — GLUCOSE, CAPILLARY
Glucose-Capillary: 105 mg/dL — ABNORMAL HIGH (ref 70–99)
Glucose-Capillary: 106 mg/dL — ABNORMAL HIGH (ref 70–99)
Glucose-Capillary: 108 mg/dL — ABNORMAL HIGH (ref 70–99)
Glucose-Capillary: 109 mg/dL — ABNORMAL HIGH (ref 70–99)
Glucose-Capillary: 135 mg/dL — ABNORMAL HIGH (ref 70–99)
Glucose-Capillary: 99 mg/dL (ref 70–99)

## 2020-02-04 LAB — MAGNESIUM: Magnesium: 2.9 mg/dL — ABNORMAL HIGH (ref 1.7–2.4)

## 2020-02-04 MED ORDER — SODIUM CHLORIDE 0.9 % IV SOLN
INTRAVENOUS | Status: DC | PRN
  Administered 2020-02-04 – 2020-02-12 (×3): 250 mL via INTRAVENOUS

## 2020-02-04 MED ORDER — SODIUM CHLORIDE 0.9% FLUSH
10.0000 mL | Freq: Two times a day (BID) | INTRAVENOUS | Status: DC
  Administered 2020-02-04 – 2020-02-15 (×18): 10 mL via INTRAVENOUS

## 2020-02-04 NOTE — Progress Notes (Signed)
Plan to OR tomorrow pm for wound vac exchange.  RN and primary notified

## 2020-02-04 NOTE — Progress Notes (Addendum)
PROGRESS NOTE    [name]  XBL:[id number] DOB: 1946/03/15 DOA: 01/27/2020  PCP: [facility]    LOS - 7   Brief Narrative:  [name] a 74 y.o.malewith medical history significant of COVID12/08/2019. CVA, prediabetes, PE around 2018 started on Xarelto, chronic tremors of bilateral hands, bipolar disorder, morbid obesity,CKD, HLD, neuropathy, chronic atrial fibrillation, DM2,COPD, and Parkinson disease.He presented withfever from SNF. He had been discharged from SNF after a 10 day inpatient stay for acute hypoxemic respiratory failure due to COVID-19. During this stay the patient was treated with Remdesivir, decadron, Rocephin and azithromycin.  In December patient was admitted for Covid with acute respiratory hypoxemic failure treated remdesivir 10 days of Decadron and Rocephin and azithromycin for possible bacterial superinfection in [name]. This was followed by a roughly three week stay at [name]. This was followed by a three day stay at [name].   His hospital stay in [name] was complicated by tachycardia patient was seen by cardiology started Cardizem metoprolol.The patient suffered respiratory arrest on 24 November 2019 and had to be intubated.He had an EEG showing slowing consistent with mild residual encephalopathy patient required tracheostomy and PEG tube placement. He later on developed pressure sacral decubitus ulcer which required debridement.He required broad-spectrum antibiotics IV vancomycin and Zosyn. He thentransferred to [name] [name].His antibiotics were later simplified to cefepime. His trach was de cannulated.Patient remains nonverbal.In February he developed pneumonia worrisome for aspiration and was treated Unasyn.Hefailed trial of decannulation plan for him to have a ENT consult done to place an hour trach he had copious secretion requiring frequent suctioning as well as nasotracheal  suctioning.Patient was finally yesterday discharged from [name] SN[facility], but continued to be febrile.As soon as he presented to facility and was transferred to [name] emergency department today.  The patient was admitted to a telemetry bed. General [name] and Infectious disease were consulted, and the patient went for operative I & D on 01/28/2020. Cultures were taken. He is receiving IV  The patient has been admitted to a telemetry bed. General [name] has been consulted and has taken the patient for operative I&D of the sacral wound on 01/28/2020. Cultures were taken. Infectious disease has also been consulted.The patient is receiving IV Zosyn and Vancomycin. Nutrition has been consulted and the patient has been restarted on tube feeds. SLP has been consulted for swallow eval, although this has not yet taken place as the patient has been in procedures. Cultures have grown enterococcus and possible anaerobes. Infectious disease feels that it will be unlikely that these wounds will heal given the patient's poor nutritional and mobility status. He has recommended palliative care consult.Returned to the OR again3/9and 3/11 for further debridement.  Subjective 3/14: Patient seen at bedside this morning. No acute events reported overnight. He does talk some today but less than yesterday. He denies pain or feeling sick, fevers or chills, nausea, chest pain or trouble breathing.  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 Lactic acid was 1.7 and  procalcitonin was 0.28. Initially this was felt to be due to pneumonia.  However the patient had received treatment with Unasyn for pneumonia in February. CXR demonstrated persistent radiological evidence of pneumonia, but clinically the patient has no adventitious lung sounds, no cough, and he is saturating 98% on room air. However, CT does show evidence of Stage IV decubitus sacral ulcer with abscess and possible bone involvement. He has gone for multiple surgical debridements with general surgerythis admission.   Stage IV sacral decubitus ulcerwith abscess and possible bony involvement.  S/P I & D with general surgeryx 3. The patient has a history of shrapnel, sowould not be a good candidate for MRI.CRP elevated at 9.7. ESR is 126.Albumin 2.1. Culture has grown outVanc resistantE. Faecium and possible anaerobe. --ID consulted, appreciate recommendations --ContinueonIV Unasyn and Daptomycin  --unlikely sacral wound will heal with poor nutritional and functional status, palliative consulted --general [name] following --wound vac change in OR planned for today 3/12 --palliative care following, patient will be followed by outpatient palliative at [name] after discharge  ?Recurrent aspiration pneumonia versus HCAP:resolved ENT was able to remove a large piece of retained material from vallecula on 3/11. --Speech evaluations, ongoing --continue tube feeds by PEG tube  Dysphagia:mgmt as above  Hypernatremia: Napeaked to 153. Getting free waterflushes by PEG tube. --increased free water flushes to 150 cc q2h (from q4h) --recheck BMP in AM  Hypertension: Chronic. Continue home medications.  Severe protein calorie malnutrition: Nutrition consult appreciated. This severely impacts the patient's ability to heal these wounds.  Anemia: Likely due to chronic disease. Will check iron studies.  Chronic atrial fibrillation(HCC)-The patient has a CHADS2VASC score of >3. Resume  eliquis when cleared by general [name]. Rate is well controlled on metoprolol and amiodarone which have been continued.  Type 2 Diabetes: Hb A1c is 4.2. Glucoses to be followed by FSBS and sensitive SSI. Oral antihyperglycemics have been held. He will be on a carbohydrate controlled diet and hypoglycemic protocol.  Parkinson disease: Noted. Likely playing a part in patient's history of aspiration. --ContinueCogentin, Sinemet  Encephalopathy:Status post hypoxic respiratory arrest requiring intubation in the setting of Covid in December 2020.Patient with some minimal improvements per family started to respond to his name at times but mainly remains nonverbal bedbound incontinent status post PEG tube. Lurline Idol has been decannulated perLTAC notes if patient has significant secretions may need to replace tracheostomy. At this point appears to be stable.    DVT prophylaxis:SCD's Code Status: DNR Family Communication:none at bedside during encounter, will call sister  Disposition Plan:To SNF pending improvement and clearance by general [name]. Palliative care meeting with family tomorrow at 2pm.  Patient awaiting wound vac change in OR with [name] today.  If no further surgical debridement required after today, likely patient can be discharged to rehab once wound vac and supplies available at facility. Coming FromSNF Exp DC Datepossibly this weekend, more likely Monday 3/15  Barriersas above Medically Stable for Discharge?No  Consultants:  General [name]  Infectious Disease  Palliative Care  Procedures:  Surgical debridement of sacral decubitus ulcer x 2  Antimicrobials:  Unasyn, Daptomycin    Objective: Vitals:   02/03/20 0853 02/03/20 1241 02/03/20 1641 02/03/20 1948  BP: (!) 161/77 130/79 129/70 (!) 144/76  Pulse: 84 80 84 74  Resp: _0 (!) 24  Temp: 98.3 F (36.8 C) 97.9 F (36.6 C) 97.9 F (36.6 C) 97.9 F (36.6 C)  TempSrc:   Oral  Oral  SpO2: 98% 99% 98% 98%  Weight:      Height:  Intake/Output Summary (Last 24 hours) at 02/04/2020 0809 Last data filed at 02/03/2020 2146 Gross per 24 hour  Intake --  Output 1400 ml  Net -1400 ml   Filed Weights   01/27/20 2220  Weight: 86.2 kg    Examination:  General exam: awake, alert, no acute distress, obese HEENT: clear conjunctiva, anicteric sclera, very dry mucus membranes, hearing grossly normal, lips and tongue with think whitish-yellow clumps of material Respiratory system: CTAB, no wheezes, rales or rhonchi, normal respiratory effort. Cardiovascular system: normal S1/S2, RRR, no pedal edema.   Gastrointestinal system: soft, NT, ND, PEG tube in place, +bowel sounds, wound vac container with dark fludi    Data Reviewed: I have personally reviewed following labs and imaging studies  CBC: Recent Labs  Lab 01/31/20 0302 02/01/20 0458 02/02/20 0831 02/03/20 0556 02/04/20 0640  WBC 8.5 7.2 7.4 7.0 7.6  HGB 7.5* 7.3* 8.2* 8.4* 8.3*  HCT 26.1* 24.6* 28.4* 29.3* 28.3*  MCV 114.0* 114.4* 114.5* 114.9* 111.9*  PLT 427* 364 389 388 712   Basic Metabolic Panel: Recent Labs  Lab 01/31/20 0302 02/01/20 0458 02/02/20 0831 02/03/20 0556 02/04/20 0640  NA 149* 153* 153* 152* 151*  K 3.7 3.6 4.0 3.8 4.2  CL 117* 117* 118* 116* 114*  CO2 _0 GLUCOSE 155* 143* 125* 128* 117*  BUN _1 CREATININE 0.44* 0.40* 0.38* 0.42* 0.45*  CALCIUM 10.5* 10.2 10.7* 10.3 10.2  MG  --  2.9* 3.0* 2.8* 2.9*   GFR: Estimated Creatinine Clearance: 90.8 mL/min (A) (by C-G formula based on SCr of 0.45 mg/dL (L)). Liver Function Tests: Recent Labs  Lab 01/29/20 0319  AST 29  ALT 14  ALKPHOS 130*  BILITOT 0.5  PROT 6.5  ALBUMIN 2.1*   No results for input(s): LIPASE, AMYLASE in the last 168 hours. No results for input(s): AMMONIA in the last 168 hours. Coagulation Profile: No results for input(s): INR, PROTIME in the last 168  hours. Cardiac Enzymes: Recent Labs  Lab 02/01/20 0458  CKTOTAL 27*   BNP (last 3 results) No results for input(s): PROBNP in the last 8760 hours. HbA1C: No results for input(s): HGBA1C in the last 72 hours. CBG: Recent Labs  Lab 02/03/20 1639 02/03/20 1951 02/04/20 0008 02/04/20 0351 02/04/20 0752  GLUCAP 110* 93 105* 99 106*   Lipid Profile: No results for input(s): CHOL, HDL, LDLCALC, TRIG, CHOLHDL, LDLDIRECT in the last 72 hours. Thyroid Function Tests: No results for input(s): TSH, T4TOTAL, FREET4, T3FREE, THYROIDAB in the last 72 hours. Anemia Panel: No results for input(s): VITAMINB12, FOLATE, FERRITIN, TIBC, IRON, RETICCTPCT in the last 72 hours. Sepsis Labs: Recent Labs  Lab 01/29/20 0319  PROCALCITON 0.20    Recent Results (from the past 240 hour(s))  Blood Culture (routine x 2)     Status: None   Collection Time: 01/27/20 10:42 PM   Specimen: BLOOD  Result Value Ref Range Status   Specimen Description BLOOD BLOOD LEFT HAND  Final   Special Requests   Final    BOTTLES DRAWN AEROBIC AND ANAEROBIC Blood Culture adequate volume   Culture   Final    NO GROWTH 5 DAYS Performed at [facility], [address] [place], [place] [address]    Report Status 02/01/2020 FINAL  Final  Blood Culture (routine x 2)     Status: None   Collection Time: 01/27/20 10:42 PM   Specimen: BLOOD  Result Value Ref Range Status  Specimen Description BLOOD BLOOD LEFT HAND  Final   Special Requests   Final    BOTTLES DRAWN AEROBIC AND ANAEROBIC Blood Culture adequate volume   Culture   Final    NO GROWTH 5 DAYS Performed at [name], [address] [place], [place] [address]    Report Status 02/01/2020 FINAL  Final  Respiratory Panel by RT PCR (Flu A&B, Covid) - Nasopharyngeal Swab     Status: None   Collection Time: 01/27/20 11:57 PM   Specimen: Nasopharyngeal Swab  Result Value Ref Range Status   SARS Coronavirus 2 by RT PCR NEGATIVE NEGATIVE  Final    Comment: (NOTE) SARS-CoV-2 target nucleic acids are NOT DETECTED. The SARS-CoV-2 RNA is generally detectable in upper respiratoy specimens during the acute phase of infection. The lowest concentration of SARS-CoV-2 viral copies this assay can detect is 131 copies/mL. A negative result does not preclude SARS-Cov-2 infection and should not be used as the sole basis for treatment or other patient management decisions. A negative result may occur with  improper specimen collection/handling, submission of specimen other than nasopharyngeal swab, presence of viral mutation(s) within the areas targeted by this assay, and inadequate number of viral copies (<131 copies/mL). A negative result must be combined with clinical observations, patient history, and epidemiological information. The expected result is Negative. Fact Sheet for Patients:  PinkCheek.be Fact Sheet for Healthcare Providers:  GravelBags.it This test is not yet ap proved or cleared by the [place] FDA and  has been authorized for detection and/or diagnosis of SARS-CoV-2 by FDA under an Emergency Use Authorization (EUA). This EUA will remain  in effect (meaning this test can be used) for the duration of the COVID-19 declaration under Section 564(b)(1) of the Act, 21 U.S.C. section 360bbb-3(b)(1), unless the authorization is terminated or revoked sooner.    Influenza A by PCR NEGATIVE NEGATIVE Final   Influenza B by PCR NEGATIVE NEGATIVE Final    Comment: (NOTE) The Xpert Xpress SARS-CoV-2/FLU/RSV assay is intended as an aid in  the diagnosis of influenza from Nasopharyngeal swab specimens and  should not be used as a sole basis for treatment. Nasal washings and  aspirates are unacceptable for Xpert Xpress SARS-CoV-2/FLU/RSV  testing. Fact Sheet for Patients: PinkCheek.be Fact Sheet for Healthcare  Providers: GravelBags.it This test is not yet approved or cleared by the [place] FDA and  has been authorized for detection and/or diagnosis of SARS-CoV-2 by  FDA under an Emergency Use Authorization (EUA). This EUA will remain  in effect (meaning this test can be used) for the duration of the  Covid-19 declaration under Section 564(b)(1) of the Act, 21  U.S.C. section 360bbb-3(b)(1), unless the authorization is  terminated or revoked. Performed at [name], [address] [place], [place] [address]   Aerobic/Anaerobic Culture (surgical/deep wound)     Status: None   Collection Time: 01/28/20  1:06 PM   Specimen: PATH Soft tissue  Result Value Ref Range Status   Specimen Description   Final    TISSUE Performed at [facility], [address] [place], [place] [address]    Special Requests   Final    NONE Performed at [facility], [name] [place], [place] [address]    Gram Stain   Final    RARE WBC PRESENT,BOTH PMN AND MONONUCLEAR NO ORGANISMS SEEN Performed at [facility], [place] [address], [place], [place] [address]    Culture   Final  RARE ENTEROCOCCUS FAECIUM NO ANAEROBES ISOLATED VANCOMYCIN RESISTANT ENTEROCOCCUS ISOLATED    Report Status 02/03/2020 FINAL  Final   Organism ID, Bacteria ENTEROCOCCUS FAECIUM  Final      Susceptibility   Enterococcus faecium - MIC*    AMPICILLIN >=32 RESISTANT Resistant     VANCOMYCIN >=32 RESISTANT Resistant     GENTAMICIN SYNERGY SENSITIVE Sensitive     LINEZOLID 2 SENSITIVE Sensitive     * RARE ENTEROCOCCUS FAECIUM  Aerobic/Anaerobic Culture (surgical/deep wound)     Status: None   Collection Time: 01/28/20  1:54 PM   Specimen: Wound; Body Fluid  Result Value Ref Range Status   Specimen Description   Final    WOUND Performed at [facility], [address] [place], [place] [address]    Special Requests   Final    NONE Performed at  [name], [address] [place], [place] [address]    Gram Stain   Final    FEW WBC PRESENT, PREDOMINANTLY PMN NO ORGANISMS SEEN    Culture   Final    No growth aerobically or anaerobically. Performed at [facility], [place] [address], [place], [place] [address]    Report Status 02/02/2020 FINAL  Final  Aerobic/Anaerobic Culture (surgical/deep wound)     Status: None   Collection Time: 01/28/20  1:55 PM   Specimen: PATH Soft tissue  Result Value Ref Range Status   Specimen Description   Final    TISSUE Performed at [name], [address], [name], [place] [address]    Special Requests   Final    NONE Performed at [facility], [address] [place], [place] [address]    Gram Stain   Final    RARE WBC PRESENT, PREDOMINANTLY PMN FEW GRAM POSITIVE COCCI IN PAIRS FEW GRAM VARIABLE ROD    Culture   Final    ABUNDANT ENTEROCOCCUS FAECIUM FEW CANDIDA ALBICANS FEW BACTEROIDES THETAIOTAOMICRON BETA LACTAMASE POSITIVE Performed at [facility], [place] [address] [place], [place] [address]    Report Status 02/01/2020 FINAL  Final   Organism ID, Bacteria ENTEROCOCCUS FAECIUM  Final      Susceptibility   Enterococcus faecium - MIC*    AMPICILLIN >=32 RESISTANT Resistant     VANCOMYCIN >=32 RESISTANT Resistant     GENTAMICIN SYNERGY SENSITIVE Sensitive     LINEZOLID 2 SENSITIVE Sensitive     * ABUNDANT ENTEROCOCCUS FAECIUM  Aerobic/Anaerobic Culture (surgical/deep wound)     Status: None   Collection Time: 01/28/20  2:17 PM   Specimen: PATH Bone biopsy; Tissue  Result Value Ref Range Status   Specimen Description   Final    BIOPSY Performed at [name], [name] [place], [place] [address]    Special Requests   Final    NONE Performed at [name], [place], [place] [address]    Gram Stain NO WBC SEEN NO ORGANISMS SEEN   Final   Culture   Final    RARE VANCOMYCIN RESISTANT  ENTEROCOCCUS ISOLATED NO ANAEROBES ISOLATED Performed at [facility], [place] [address] [place], [place] [address]    Report Status 02/02/2020 FINAL  Final   Organism ID, Bacteria VANCOMYCIN RESISTANT ENTEROCOCCUS ISOLATED  Final      Susceptibility   Vancomycin resistant enterococcus isolated - MIC*    AMPICILLIN >=32 RESISTANT Resistant     VANCOMYCIN >=32 RESISTANT Resistant     GENTAMICIN  SYNERGY SENSITIVE Sensitive     LINEZOLID 2 SENSITIVE Sensitive     * RARE VANCOMYCIN RESISTANT ENTEROCOCCUS ISOLATED  MRSA PCR Screening     Status: None   Collection Time: 01/31/20  8:51 PM   Specimen: Nasal Mucosa; Nasopharyngeal  Result Value Ref Range Status   MRSA by PCR NEGATIVE NEGATIVE Final    Comment:        The GeneXpert MRSA Assay (FDA approved for NASAL specimens only), is one component of a comprehensive MRSA colonization surveillance program. It is not intended to diagnose MRSA infection nor to guide or monitor treatment for MRSA infections. Performed at [facility], [address], [place], [place] [address]          Radiology Studies: No results found.      Scheduled Meds: . amiodarone  100 mg Per Tube BID  . benztropine  0.5 mg Per Tube BID  . carbidopa-levodopa  1 tablet Per Tube BID  . diltiazem  30 mg Per Tube QID  . famotidine  20 mg Per Tube Daily  . feeding supplement (PRO-STAT SUGAR FREE 64)  30 mL Per Tube Daily  . fluconazole  200 mg Per Tube Q24H  . free water  150 mL Per Tube Q2H  . hydrALAZINE  50 mg Per Tube BID  . insulin aspart  0-9 Units Subcutaneous Q4H  . ipratropium-albuterol  3 mL Nebulization TID  . metoprolol tartrate  12.5 mg Per Tube BID  . nutrition supplement (JUVEN)  1 packet Per Tube BID BM  . thiamine  200 mg Per Tube Daily   Continuous Infusions: . ampicillin-sulbactam (UNASYN) IV 3 g (02/04/20 0540)  . DAPTOmycin (CUBICIN)  IV 700 mg (02/03/20 2005)  . feeding supplement (JEVITY 1.5 CAL/FIBER) 1,000 mL  (02/03/20 0926)     LOS: 7 days    Time spent: 20 minutes    Ezekiel Slocumb, DO [facility]   If 7PM-7AM, please contact night-coverage [email or web address] 02/04/2020, 8:09 AM

## 2020-02-05 ENCOUNTER — Encounter
Admission: EM | Disposition: A | Discharge status: Skilled Nursing Facility | Payer: Self-pay | Source: Home / Self Care | Attending: Internal Medicine

## 2020-02-05 ENCOUNTER — Inpatient Hospital Stay: Payer: Medicare Other | Admitting: Anesthesiology

## 2020-02-05 ENCOUNTER — Encounter: Payer: Self-pay | Admitting: Internal Medicine

## 2020-02-05 HISTORY — PX: APPLICATION OF WOUND VAC: SHX5189

## 2020-02-05 HISTORY — PX: INCISION AND DRAINAGE OF WOUND: SHX1803

## 2020-02-05 LAB — BASIC METABOLIC PANEL
Anion gap: 9 (ref 5–15)
BUN: 21 mg/dL (ref 8–23)
CO2: 26 mmol/L (ref 22–32)
Calcium: 9.6 mg/dL (ref 8.9–10.3)
Chloride: 110 mmol/L (ref 98–111)
Creatinine, Ser: 0.44 mg/dL — ABNORMAL LOW (ref 0.61–1.24)
GFR calc Af Amer: >60 mL/min (ref >60)
GFR calc non Af Amer: >60 mL/min (ref >60)
Glucose, Bld: 80 mg/dL (ref 70–99)
Potassium: 3.9 mmol/L (ref 3.5–5.1)
Sodium: 145 mmol/L (ref 135–145)

## 2020-02-05 LAB — GLUCOSE, CAPILLARY
Glucose-Capillary: 117 mg/dL — ABNORMAL HIGH (ref 70–99)
Glucose-Capillary: 118 mg/dL — ABNORMAL HIGH (ref 70–99)
Glucose-Capillary: 67 mg/dL — ABNORMAL LOW (ref 70–99)
Glucose-Capillary: 76 mg/dL (ref 70–99)
Glucose-Capillary: 76 mg/dL (ref 70–99)
Glucose-Capillary: 80 mg/dL (ref 70–99)
Glucose-Capillary: 80 mg/dL (ref 70–99)
Glucose-Capillary: 83 mg/dL (ref 70–99)
Glucose-Capillary: 85 mg/dL (ref 70–99)

## 2020-02-05 LAB — CBC
HCT: 26.3 % — ABNORMAL LOW (ref 39.0–52.0)
Hemoglobin: 7.7 g/dL — ABNORMAL LOW (ref 13.0–17.0)
MCH: 32.4 pg (ref 26.0–34.0)
MCHC: 29.3 g/dL — ABNORMAL LOW (ref 30.0–36.0)
MCV: 110.5 fL — ABNORMAL HIGH (ref 80.0–100.0)
Platelets: 356 10*3/uL (ref 150–400)
RBC: 2.38 MIL/uL — ABNORMAL LOW (ref 4.22–5.81)
RDW: 15.5 % (ref 11.5–15.5)
WBC: 7.6 10*3/uL (ref 4.0–10.5)
nRBC: 0 % (ref 0.0–0.2)

## 2020-02-05 LAB — MAGNESIUM: Magnesium: 2.6 mg/dL — ABNORMAL HIGH (ref 1.7–2.4)

## 2020-02-05 SURGERY — IRRIGATION AND DEBRIDEMENT WOUND
Anesthesia: General

## 2020-02-05 MED ORDER — FREE WATER
125.0000 mL | Status: DC
  Administered 2020-02-05 – 2020-02-06 (×8): 125 mL

## 2020-02-05 MED ORDER — JEVITY 1.5 CAL/FIBER PO LIQD
1000.0000 mL | ORAL | Status: DC
  Administered 2020-02-05: 1000 mL via ORAL

## 2020-02-05 MED ORDER — PHENYLEPHRINE HCL (PRESSORS) 10 MG/ML IV SOLN
INTRAVENOUS | Status: DC | PRN
  Administered 2020-02-05 (×5): 100 ug via INTRAVENOUS

## 2020-02-05 MED ORDER — LIDOCAINE HCL (PF) 2 % IJ SOLN
INTRAMUSCULAR | Status: AC
  Filled 2020-02-05: qty 10

## 2020-02-05 MED ORDER — JEVITY 1.5 CAL/FIBER PO LIQD
1000.0000 mL | ORAL | Status: DC
  Administered 2020-02-05 – 2020-02-08 (×2): 1000 mL

## 2020-02-05 MED ORDER — LIDOCAINE HCL (CARDIAC) PF 100 MG/5ML IV SOSY
PREFILLED_SYRINGE | INTRAVENOUS | Status: DC | PRN
  Administered 2020-02-05: 50 mg via INTRAVENOUS

## 2020-02-05 MED ORDER — ACETAMINOPHEN 10 MG/ML IV SOLN
INTRAVENOUS | Status: DC | PRN
  Administered 2020-02-05: 1000 mg via INTRAVENOUS

## 2020-02-05 MED ORDER — DEXMEDETOMIDINE HCL IN NACL 80 MCG/20ML IV SOLN
INTRAVENOUS | Status: AC
  Filled 2020-02-05: qty 20

## 2020-02-05 MED ORDER — BUPIVACAINE-EPINEPHRINE 0.5% -1:200000 IJ SOLN
INTRAMUSCULAR | Status: DC | PRN
  Administered 2020-02-05: 30 mL

## 2020-02-05 MED ORDER — BUPIVACAINE HCL (PF) 0.5 % IJ SOLN
INTRAMUSCULAR | Status: AC
  Filled 2020-02-05: qty 30

## 2020-02-05 MED ORDER — EPINEPHRINE PF 1 MG/ML IJ SOLN
INTRAMUSCULAR | Status: AC
  Filled 2020-02-05: qty 1

## 2020-02-05 MED ORDER — ACETAMINOPHEN 10 MG/ML IV SOLN
INTRAVENOUS | Status: AC
  Filled 2020-02-05: qty 100

## 2020-02-05 MED ORDER — JUVEN PO PACK
1.0000 | PACK | Freq: Two times a day (BID) | ORAL | Status: DC
  Administered 2020-02-06 – 2020-02-15 (×19): 1

## 2020-02-05 MED ORDER — SODIUM CHLORIDE FLUSH 0.9 % IV SOLN
INTRAVENOUS | Status: AC
  Filled 2020-02-05: qty 10

## 2020-02-05 MED ORDER — PROPOFOL 10 MG/ML IV BOLUS
INTRAVENOUS | Status: AC
  Filled 2020-02-05: qty 20

## 2020-02-05 MED ORDER — FENTANYL CITRATE (PF) 100 MCG/2ML IJ SOLN: 25.0000 ug | INTRAMUSCULAR | Status: DC | PRN

## 2020-02-05 MED ORDER — DEXMEDETOMIDINE HCL 200 MCG/2ML IV SOLN
INTRAVENOUS | Status: DC | PRN
  Administered 2020-02-05 (×5): 4 ug via INTRAVENOUS

## 2020-02-05 MED ORDER — PROPOFOL 500 MG/50ML IV EMUL
INTRAVENOUS | Status: DC | PRN
  Administered 2020-02-05: 20 ug/kg/min via INTRAVENOUS

## 2020-02-05 MED ORDER — PRO-STAT SUGAR FREE PO LIQD
30.0000 mL | Freq: Every day | ORAL | Status: DC
  Administered 2020-02-06 – 2020-02-09 (×4): 30 mL

## 2020-02-05 SURGICAL SUPPLY — 41 items
APL PRP STRL LF DISP 70% ISPRP (MISCELLANEOUS)
BNDG GAUZE 4.5X4.1 6PLY STRL (MISCELLANEOUS) ×1 IMPLANT
CANISTER SUCT 1200ML W/VALVE (MISCELLANEOUS) ×3 IMPLANT
CANISTER WOUND CARE 500ML ATS (WOUND CARE) ×2 IMPLANT
CHLORAPREP W/TINT 26 (MISCELLANEOUS) ×1 IMPLANT
CNTNR SPEC 2.5X3XGRAD LEK (MISCELLANEOUS) ×2
CONT SPEC 4OZ STER OR WHT (MISCELLANEOUS) ×4
CONT SPEC 4OZ STRL OR WHT (MISCELLANEOUS) ×2
CONTAINER SPEC 2.5X3XGRAD LEK (MISCELLANEOUS) ×1 IMPLANT
COVER WAND RF STERILE (DRAPES) ×1 IMPLANT
DRAIN PENROSE 1/4X12 LTX STRL (WOUND CARE) ×1 IMPLANT
DRAPE LAPAROTOMY 77X122 PED (DRAPES) ×3 IMPLANT
DRSG GAUZE FLUFF 36X18 (GAUZE/BANDAGES/DRESSINGS) ×1 IMPLANT
DRSG MEPILEX FLEX 3X3 (GAUZE/BANDAGES/DRESSINGS) ×2 IMPLANT
DRSG VAC ATS LRG SENSATRAC (GAUZE/BANDAGES/DRESSINGS) ×2 IMPLANT
ELECT REM PT RETURN 9FT ADLT (ELECTROSURGICAL) ×3
ELECTRODE REM PT RTRN 9FT ADLT (ELECTROSURGICAL) ×1 IMPLANT
GAUZE SPONGE 4X4 12PLY STRL (GAUZE/BANDAGES/DRESSINGS) ×1 IMPLANT
GLOVE BIO SURGEON STRL SZ7 (GLOVE) ×4 IMPLANT
GLOVE BIOGEL PI IND STRL 7.0 (GLOVE) IMPLANT
GLOVE BIOGEL PI INDICATOR 7.0 (GLOVE) ×4
GLOVE SURG SYN 7.0 (GLOVE) ×3 IMPLANT
GLOVE SURG SYN 7.0 PF PI (GLOVE) ×1 IMPLANT
GLOVE SURG SYN 7.5  E (GLOVE) ×3
GLOVE SURG SYN 7.5 E (GLOVE) ×1 IMPLANT
GLOVE SURG SYN 7.5 PF PI (GLOVE) ×1 IMPLANT
GOWN STRL REUS W/ TWL LRG LVL3 (GOWN DISPOSABLE) ×2 IMPLANT
GOWN STRL REUS W/TWL LRG LVL3 (GOWN DISPOSABLE) ×9
KIT TURNOVER KIT A (KITS) ×3 IMPLANT
LABEL OR SOLS (LABEL) ×3 IMPLANT
NEEDLE HYPO 22GX1.5 SAFETY (NEEDLE) ×3 IMPLANT
NS IRRIG 500ML POUR BTL (IV SOLUTION) ×3 IMPLANT
PACK BASIN MINOR ARMC (MISCELLANEOUS) ×3 IMPLANT
PAD ABD DERMACEA PRESS 5X9 (GAUZE/BANDAGES/DRESSINGS) ×1 IMPLANT
PREP PVP WINGED SPONGE (MISCELLANEOUS) ×2 IMPLANT
SOL PREP PVP 2OZ (MISCELLANEOUS) ×3
SOLUTION PREP PVP 2OZ (MISCELLANEOUS) ×1 IMPLANT
SPONGE LAP 18X18 RF (DISPOSABLE) ×4 IMPLANT
SWAB CULTURE AMIES ANAERIB BLU (MISCELLANEOUS) ×2 IMPLANT
SYR 20ML LL LF (SYRINGE) ×1 IMPLANT
SYR BULB IRRIG 60ML STRL (SYRINGE) ×3 IMPLANT

## 2020-02-05 NOTE — Anesthesia Preprocedure Evaluation (Addendum)
Anesthesia Evaluation  Patient identified by MRN, date of birth, ID band Patient confused and Patient unresponsive    Reviewed: Allergy & Precautions, H&P , NPO status , reviewed documented beta blocker date and time   History of Anesthesia Complications Negative for: history of anesthetic complications  Airway Mallampati: III  TM Distance: >3 FB Neck ROM: limited   Comment: S/P Trach Dental  (+) Chipped, Missing, Poor Dentition VERY poor dentition:   Pulmonary pneumonia,  Prolonged, complicated clinical course since COVID 12/20          Cardiovascular hypertension, Pt. on medications + dysrhythmias Atrial Fibrillation      Neuro/Psych PSYCHIATRIC DISORDERS Bipolar Disorder  Neuromuscular disease    GI/Hepatic Neg liver ROS, neg GERD  ,  Endo/Other  diabetes, Type 2, Oral Hypoglycemic Agents  Renal/GU Renal InsufficiencyRenal disease     Musculoskeletal   Abdominal   Peds  Hematology negative hematology ROS (+)   Anesthesia Other Findings Past Medical History: No date: Acute metabolic encephalopathy No date: Acute on chronic respiratory failure with hypoxia (HCC) No date: Bipolar 1 disorder (HCC) No date: BPH (benign prostatic hyperplasia) No date: Cerebrovascular disease No date: Chronic atrial fibrillation (HCC) No date: Chronic kidney disease, stage III (moderate) No date: COVID-19 virus infection No date: HTN No date: Hyperlipidemia No date: Neuropathy No date: Pneumonia due to COVID-19 virus No date: SVT (supraventricular tachycardia) (HCC) No date: Tremor, essential     Comment:  only to the hands No date: Type 2 diabetes mellitus (HCC)   Reproductive/Obstetrics                            Anesthesia Physical  Anesthesia Plan  ASA: IV  Anesthesia Plan: General   Post-op Pain Management:    Induction: Intravenous  PONV Risk Score and Plan: Treatment may vary due to  age or medical condition and TIVA  Airway Management Planned: Nasal Cannula and Natural Airway  Additional Equipment:   Intra-op Plan:   Post-operative Plan: Extubation in OR  Informed Consent: I have reviewed the patients History and Physical, chart, labs and discussed the procedure including the risks, benefits and alternatives for the proposed anesthesia with the patient or authorized representative who has indicated his/her understanding and acceptance.   Patient has DNR.  Discussed DNR with power of attorney and Suspend DNR.   Dental Advisory Given  Plan Discussed with: CRNA  Anesthesia Plan Comments: (History and phone consent from the patients sister Kyle [name] at [phone]   Sister consented for risks of anesthesia including but not limited to:  - adverse reactions to medications - damage to teeth, lips or other oral mucosa - sore throat or hoarseness - Damage to heart, brain, lungs or loss of life  She voiced understanding.)        Anesthesia Quick Evaluation

## 2020-02-05 NOTE — Progress Notes (Signed)
Patient returned back to rm 253 from OR. Patient is alert, VSS, no complaints of pain and stated that he is comfortable. New wound vac dressing is in place.

## 2020-02-05 NOTE — Anesthesia Postprocedure Evaluation (Signed)
Anesthesia Post Note  Patient: Kyle [name]  Procedure(s) Performed: IRRIGATION AND DEBRIDEMENT WOUND (N/A ) WOUND VAC Change Out (N/A )  Patient location during evaluation: PACU Anesthesia Type: General Level of consciousness: awake and alert Pain management: pain level controlled Vital Signs Assessment: post-procedure vital signs reviewed and stable Respiratory status: spontaneous breathing, nonlabored ventilation, respiratory function stable and patient connected to nasal cannula oxygen Cardiovascular status: blood pressure returned to baseline and stable Postop Assessment: no apparent nausea or vomiting Anesthetic complications: no     Last Vitals:  Vitals:   02/05/20 1346 02/05/20 1429  BP:    Pulse: 64   Resp: 15   Temp:    SpO2: 93% 93%    Last Pain:  Vitals:   02/05/20 1331  TempSrc:   PainSc: 0-No pain                 Cleda Mccreedy Joscelin Fray

## 2020-02-05 NOTE — Progress Notes (Signed)
02/05/2020  Subjective: Patient is 3 Days Post-Op s/p sacral and left trochanteric wound exploration and debridement, with wound vac change on 3/12.  There was necrotic fascia and muscle on the left trochanteric wound and some minimal amount of necrotic subcutaneous tissue on the sacral wound.  No acute events.  Denies any pain, but difficult to understand.    Vital signs: Temp:  [97.6 F (36.4 C)-98.9 F (37.2 C)] 97.8 F (36.6 C) (03/15 0740) Pulse Rate:  [70-93] 83 (03/15 0740) Resp:  [16-19] 19 (03/15 0740) BP: (125-159)/(62-78) 159/69 (03/15 0740) SpO2:  [94 %-97 %] 97 % (03/15 0740)   Intake/Output: 03/14 0701 - 03/15 0700 In: 1299.5 [I.V.:49.5; NG/GT:1050; IV Piggyback:200] Out: 1550 [Urine:1550] Last BM Date: 02/04/20  Physical Exam: Constitutional: No acute distress Skin:  Wound vac in place with good seal, with bridging going to anterior left thigh where suction disc is located.  Serosanguinous fluid in vac cannister.  Labs:  Recent Labs    02/04/20 0640 02/05/20 0440  WBC 7.6 7.6  HGB 8.3* 7.7*  HCT 28.3* 26.3*  PLT 346 356   Recent Labs    02/04/20 0640 02/05/20 0440  NA 151* 145  K 4.2 3.9  CL 114* 110  CO2 29 26  GLUCOSE 117* 80  BUN 19 21  CREATININE 0.45* 0.44*  CALCIUM 10.2 9.6   No results for input(s): LABPROT, INR in the last 72 hours.  Imaging: No results found.  Assessment/Plan: This is a 74 y.o. male s/p sacral and left trochanteric wound debridements and wound vac change.  --Will take back to OR today for another look at his wounds, debridement if necessary, and wound vac change.  If tissue appears healthy, then we can transition to bedside wound vac changes.   Howie Ill, MD [facility]

## 2020-02-05 NOTE — Op Note (Addendum)
Procedure Date:  02/05/2020  Pre-operative Diagnosis:  Sacral, Left Trochanteric, and Left Ischial decubitus wounds  Post-operative Diagnosis:  Sacral, Left Trochanteric, and Left Ischial decubitus wounds  Procedure:   1.  Debridement of sacral wound, including skin and subcutaneous tissue for 15 cm2 2.  Debridement of left trochanteric wound, including subcutaneous tissue, fascia, and muscle for 18 cm2. 3.  Application of negative pressure dressing over both sacral and left trochanteric wounds, > 50 cm2.  Surgeon:  Melvyn Neth, MD  Assistant:  Burke Keels, PA-S  Anesthesia:  MAC  Estimated Blood Loss:  5 ml  Specimens:  None  Complications:  None  Measurements:  Sacral wound overall now measures 20 x 13 cm in largest dimensions.  Left trochanteric wound measures 14 x 7.5 cm in largest dimensions.  Left ischial wound measures about 3 x 2 cm.  Indications for Procedure:  This is a 74 y.o. male with diagnosis of extensive sacral and left trochanteric decubitus wounds, s/p prior debridements on 3/7, 3/9, and 3/12, now presenting for wound exploration, possible debridement, and reapplication of wound vac dressing.  The risks of bleeding, abscess or infection, injury to surrounding structures, and need for further procedures were all discussed with the patient and his sister, and were willing to proceed.  Description of Procedure: The patient was correctly identified in the preoperative area and brought into the operating room.  The patient was placed supine with VTE prophylaxis in place.  Appropriate time-outs were performed.  Anesthesia was induced.  Then patient was placed in right lateral decubitus position.  Appropriate antibiotics were infused.  The patient's wound vac dressing was removed and the left hip and buttocks areas were prepped and draped in usual sterile fashion.  The sacral wound had minimal necrosis at the superior aspect of the wound, and this was  debrided using cautery.  After that, it was noted that the we were creating too much of a tunnel in the area, and due to concerns for the outpatient wound vac dressings not being applied appropriately, I decided to excise a section of skin and subcutaneous tissue to open up the tunnel for better sponge exposure.  Total area debrided and excised was 2 x 7.5 cm.  With this additional area excised, the sacral wound now measures about 20 x 13 cm.  We then proceeded to the left hip area.  Superiorly, there was still some residual necrotic muscle and subcutaneous tissue, but this was much improved compared to the last surgery.  This was also debrided with cautery.  Similarly, too deep of a tunnel was being created, so a portion of the overlying skin and subcutaneous tissue was excised to open the tunnel.  Total area debrided and excised was 3 x 6 cm.  With this additional area excised, the left trochanteric wound now measures about 14 x 7.5 cm.  After all debridement was completed, the wounds were thoroughly irrigated using Pulse lavage.  Then, wound vac dressing was applied over both wounds, bridging between them with sponge, and bridging anteriorly to the left anterior thigh where the suction disc was placed.  This was then connected to 125 mmHg continuous low intensity suction with good seal and no leak noted.  The left ischial wound was healing well, and was re-dressed with 4x4 gauze packing and mepilex dressing.  The patient was then turned supine, emerged from anesthesia, and brought to the recovery room for further management.  The patient tolerated the procedure well and all counts  were correct at the end of the case.   Melvyn Neth, MD

## 2020-02-05 NOTE — Anesthesia Postprocedure Evaluation (Signed)
Anesthesia Post Note  Patient: Ellsworth Lennox  Procedure(s) Performed: APPLICATION OF WOUND VAC (Left ) IRRIGATION AND DEBRIDEMENT WOUND (Left )  Patient location during evaluation: PACU Anesthesia Type: General Level of consciousness: awake and alert Pain management: pain level controlled Vital Signs Assessment: post-procedure vital signs reviewed and stable Respiratory status: spontaneous breathing, nonlabored ventilation, respiratory function stable and patient connected to nasal cannula oxygen Cardiovascular status: blood pressure returned to baseline and stable Postop Assessment: no apparent nausea or vomiting Anesthetic complications: no     Last Vitals:  Vitals:   02/05/20 1429 02/05/20 1708  BP:  (!) 145/83  Pulse:  75  Resp:  20  Temp:  36.4 C  SpO2: 93% 98%    Last Pain:  Vitals:   02/05/20 1708  TempSrc: Oral  PainSc:                  [name]

## 2020-02-05 NOTE — Progress Notes (Signed)
PROGRESS NOTE    Kyle [name]  DDU:[id number] DOB: 1946-03-30 DOA: 01/27/2020  PCP: [facility]    LOS - 8   Brief Narrative:  Kyle [name] a 74 y.o.malewith medical history significant of COVID12/08/2019. CVA, prediabetes, PE around 2018 started on Xarelto, chronic tremors of bilateral hands, bipolar disorder, morbid obesity,CKD, HLD, neuropathy, chronic atrial fibrillation, DM2,COPD, and Parkinson disease.He presented withfever from SNF. He had been discharged from SNF after a 10 day inpatient stay for acute hypoxemic respiratory failure due to COVID-19. During this stay the patient was treated with Remdesivir, decadron, Rocephin and azithromycin.  In December patient was admitted for Covid with acute respiratory hypoxemic failure treated remdesivir 10 days of Decadron and Rocephin and azithromycin for possible bacterial superinfection in [place]. This was followed by a roughly three week stay at [facility]. This was followed by a three day stay at [facility].   His hospital stay in [place] was complicated by tachycardia patient was seen by cardiology started Cardizem metoprolol.The patient suffered respiratory arrest on 24 November 2019 and had to be intubated.He had an EEG showing slowing consistent with mild residual encephalopathy patient required tracheostomy and PEG tube placement. He later on developed pressure sacral decubitus ulcer which required debridement.He required broad-spectrum antibiotics IV vancomycin and Zosyn. He thentransferred to [name] in [place].His antibiotics were later simplified to cefepime. His trach was de cannulated.Patient remains nonverbal.In February he developed pneumonia worrisome for aspiration and was treated Unasyn.Hefailed trial of decannulation plan for him to have a ENT consult done to place an hour trach he had copious secretion requiring frequent suctioning as well as nasotracheal  suctioning.Patient was finally yesterday discharged from [place] to SNF3/02/2020, but continued to be febrile.As soon as he presented to facility and was transferred to [facility] emergency department today.  The patient was admitted to a telemetry bed. General surgery and Infectious disease were consulted, and the patient went for operative I & D on 01/28/2020. Cultures were taken. He is receiving IV  The patient has been admitted to a telemetry bed. General surgery has been consulted and has taken the patient for operative I&D of the sacral wound on 01/28/2020. Cultures were taken. Infectious disease has also been consulted.The patient is receiving IV Zosyn and Vancomycin. Nutrition has been consulted and the patient has been restarted on tube feeds. SLP has been consulted for swallow eval, although this has not yet taken place as the patient has been in procedures. Cultures have grown enterococcus and possible anaerobes. Infectious disease feels that it will be unlikely that these wounds will heal given the patient's poor nutritional and mobility status. He has recommended palliative care consult.Returned to the OR again3/9, 3/11and 3/15 for further debridement.  Subjective 3/15: Patient asleep when seen this morning at bedside.  Awoke to voice but fell back to sleep.  No acute distress or discomfort noted.  No acute events reported.  Surgery to take back to the OR for wound VAC change and possible further debridement is needed today.  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 Lactic acid was  1.7 and procalcitonin was 0.28. Initially this was felt to be due to pneumonia. However the patient had received treatment with Unasyn for pneumonia in February. CXR demonstrated persistent radiological evidence of pneumonia, but clinically the patient has no adventitious lung sounds, no cough, and he is saturating 98% on room air. However, CT does show evidence of Stage IV decubitus sacral ulcer with abscess and possible bone involvement. He has gone for multiple surgical debridements with general surgerythis admission.   Stage IV sacral decubitus ulcerwith abscess and possible bony involvement.  S/P I & D with general surgeryx 3. The patient has a history of shrapnel, sowould not be a good candidate for MRI.CRP elevated at 9.7. ESR is 126.Albumin 2.1. Culture has grown outVanc resistantE. Faecium and possible anaerobe. --ID consulted, appreciate recommendations --ContinueonIV Unasyn and Daptomycin  --unlikely sacral wound will heal with poor nutritional and functional status, palliative consulted --general surgery following --wound vac change in OR planned fortoday3/12 --palliative care following, patient will be followed by outpatient palliative at [facility] after discharge  ?Recurrent aspiration pneumonia versus HCAP:resolved ENT was able to remove a large piece of retained material from vallecula on 3/11. --Speech evaluations, ongoing --continue tube feeds by PEG tube  Dysphagia:mgmt as above  Hypernatremia: Napeaked to 153. Getting free waterflushes by PEG tube. --increased free water flushes to 150 cc q2h (from q4h) --recheck BMP in AM  Hypertension: Chronic. Continue home medications.  Severe protein calorie malnutrition: Nutrition consult appreciated. This severely impacts the patient's ability to heal these wounds.  Anemia: Likely due to chronic disease. Will check iron studies.  Chronic atrial  fibrillation(HCC)-The patient has a CHADS2VASC score of >3. Resume eliquis when cleared by general surgery. Rate is well controlled on metoprolol and amiodarone which have been continued.  Type 2 Diabetes: Hb A1c is 4.2. Glucoses to be followed by FSBS and sensitive SSI. Oral antihyperglycemics have been held. He will be on a carbohydrate controlled diet and hypoglycemic protocol.  Parkinson disease: Noted. Likely playing a part in patient's history of aspiration. --ContinueCogentin, Sinemet  Encephalopathy:Status post hypoxic respiratory arrest requiring intubation in the setting of Covid in December 2020.Patient with some minimal improvements per family started to respond to his name at times but mainly remains nonverbal bedbound incontinent status post PEG tube. Lurline Idol has been decannulated perLTAC notes if patient has significant secretions may need to replace tracheostomy. At this point appears to be stable.    DVT prophylaxis:SCD's Code Status: DNR Family Communication:none at bedside during encounter  Disposition Plan:To SNF pending improvement and clearance by general surgery. Patient undergoing further surgical debridement in the OR with surgery today.  Plans for additional surgery will depend on findings today.  If no further surgical debridement deems necessary, likely patient can be discharged to rehab once wound vac and supplies available at facility. Coming FromSNF Exp DC Date 3/16 Barriersas above Medically Stable for Discharge?No  Consultants:  General Surgery  Infectious Disease  Palliative Care  Procedures:  Surgical debridement of sacral decubitus ulcer x 2  Antimicrobials:  Unasyn, Daptomycin  Objective: Vitals:   02/05/20 0414 02/05/20 0740 02/05/20 1026 02/05/20 1301  BP: (!) 154/77 (!) 159/69 (!) 157/66 (!) 158/71  Pulse: 89 83 82 79  Resp:  19 20 (!) 29  Temp: 98.2 F (36.8 C) 97.8 F (36.6 C) 97.8 F (36.6  C) 99.2 F (37.3 C)  TempSrc: Oral Oral Tympanic   SpO2: 95% 97% 96% 100%  Weight:      Height:  Intake/Output Summary (Last 24 hours) at 02/05/2020 1311 Last data filed at 02/05/2020 1309 Gross per 24 hour  Intake 1899.53 ml  Output 1553 ml  Net 346.53 ml   Filed Weights   01/27/20 2220  Weight: 86.2 kg    Examination:  General exam: Sleeping comfortably, no acute distress, obese Respiratory system: CTAB anteriorly, no wheezes, rales or rhonchi, normal respiratory effort. Cardiovascular system: normal S1/S2, RRR, no JVD, murmurs, rubs, gallops, no pedal edema.   Gastrointestinal system: soft, NT, tube feeds off, wound VAC in place    Data Reviewed: I have personally reviewed following labs and imaging studies  CBC: Recent Labs  Lab 02/01/20 0458 02/02/20 0831 02/03/20 0556 02/04/20 0640 02/05/20 0440  WBC 7.2 7.4 7.0 7.6 7.6  HGB 7.3* 8.2* 8.4* 8.3* 7.7*  HCT 24.6* 28.4* 29.3* 28.3* 26.3*  MCV 114.4* 114.5* 114.9* 111.9* 110.5*  PLT 364 389 388 346 469   Basic Metabolic Panel: Recent Labs  Lab 02/01/20 0458 02/02/20 0831 02/03/20 0556 02/04/20 0640 02/05/20 0440  NA 153* 153* 152* 151* 145  K 3.6 4.0 3.8 4.2 3.9  CL 117* 118* 116* 114* 110  CO2 '29 29 28 29 26  '$ GLUCOSE 143* 125* 128* 117* 80  BUN '13 23 17 19 21  '$ CREATININE 0.40* 0.38* 0.42* 0.45* 0.44*  CALCIUM 10.2 10.7* 10.3 10.2 9.6  MG 2.9* 3.0* 2.8* 2.9* 2.6*   GFR: Estimated Creatinine Clearance: 90.8 mL/min (A) (by C-G formula based on SCr of 0.44 mg/dL (L)). Liver Function Tests: No results for input(s): AST, ALT, ALKPHOS, BILITOT, PROT, ALBUMIN in the last 168 hours. No results for input(s): LIPASE, AMYLASE in the last 168 hours. No results for input(s): AMMONIA in the last 168 hours. Coagulation Profile: No results for input(s): INR, PROTIME in the last 168 hours. Cardiac Enzymes: Recent Labs  Lab 02/01/20 0458  CKTOTAL 27*   BNP (last 3 results) No results for input(s):  PROBNP in the last 8760 hours. HbA1C: No results for input(s): HGBA1C in the last 72 hours. CBG: Recent Labs  Lab 02/04/20 2025 02/04/20 2359 02/05/20 0411 02/05/20 0743 02/05/20 1030  GLUCAP 135* 80 83 80 76   Lipid Profile: No results for input(s): CHOL, HDL, LDLCALC, TRIG, CHOLHDL, LDLDIRECT in the last 72 hours. Thyroid Function Tests: No results for input(s): TSH, T4TOTAL, FREET4, T3FREE, THYROIDAB in the last 72 hours. Anemia Panel: No results for input(s): VITAMINB12, FOLATE, FERRITIN, TIBC, IRON, RETICCTPCT in the last 72 hours. Sepsis Labs: No results for input(s): PROCALCITON, LATICACIDVEN in the last 168 hours.  Recent Results (from the past 240 hour(s))  Blood Culture (routine x 2)     Status: None   Collection Time: 01/27/20 10:42 PM   Specimen: BLOOD  Result Value Ref Range Status   Specimen Description BLOOD BLOOD LEFT HAND  Final   Special Requests   Final    BOTTLES DRAWN AEROBIC AND ANAEROBIC Blood Culture adequate volume   Culture   Final    NO GROWTH 5 DAYS Performed at [facility], [address], [place], [place] [address]    Report Status 02/01/2020 FINAL  Final  Blood Culture (routine x 2)     Status: None   Collection Time: 01/27/20 10:42 PM   Specimen: BLOOD  Result Value Ref Range Status   Specimen Description BLOOD BLOOD LEFT HAND  Final   Special Requests   Final    BOTTLES DRAWN AEROBIC AND ANAEROBIC Blood Culture adequate volume   Culture  Final    NO GROWTH 5 DAYS Performed at [facility], [address] [place], [place] [address]    Report Status 02/01/2020 FINAL  Final  Respiratory Panel by RT PCR (Flu A&B, Covid) - Nasopharyngeal Swab     Status: None   Collection Time: 01/27/20 11:57 PM   Specimen: Nasopharyngeal Swab  Result Value Ref Range Status   SARS Coronavirus 2 by RT PCR NEGATIVE NEGATIVE Final    Comment: (NOTE) SARS-CoV-2 target nucleic acids are NOT DETECTED. The SARS-CoV-2 RNA is generally  detectable in upper respiratoy specimens during the acute phase of infection. The lowest concentration of SARS-CoV-2 viral copies this assay can detect is 131 copies/mL. A negative result does not preclude SARS-Cov-2 infection and should not be used as the sole basis for treatment or other patient management decisions. A negative result may occur with  improper specimen collection/handling, submission of specimen other than nasopharyngeal swab, presence of viral mutation(s) within the areas targeted by this assay, and inadequate number of viral copies (<131 copies/mL). A negative result must be combined with clinical observations, patient history, and epidemiological information. The expected result is Negative. Fact Sheet for Patients:  PinkCheek.be Fact Sheet for Healthcare Providers:  GravelBags.it This test is not yet ap proved or cleared by the [place] FDA and  has been authorized for detection and/or diagnosis of SARS-CoV-2 by FDA under an Emergency Use Authorization (EUA). This EUA will remain  in effect (meaning this test can be used) for the duration of the COVID-19 declaration under Section 564(b)(1) of the Act, 21 U.S.C. section 360bbb-3(b)(1), unless the authorization is terminated or revoked sooner.    Influenza A by PCR NEGATIVE NEGATIVE Final   Influenza B by PCR NEGATIVE NEGATIVE Final    Comment: (NOTE) The Xpert Xpress SARS-CoV-2/FLU/RSV assay is intended as an aid in  the diagnosis of influenza from Nasopharyngeal swab specimens and  should not be used as a sole basis for treatment. Nasal washings and  aspirates are unacceptable for Xpert Xpress SARS-CoV-2/FLU/RSV  testing. Fact Sheet for Patients: PinkCheek.be Fact Sheet for Healthcare Providers: GravelBags.it This test is not yet approved or cleared by the [place] FDA and  has been  authorized for detection and/or diagnosis of SARS-CoV-2 by  FDA under an Emergency Use Authorization (EUA). This EUA will remain  in effect (meaning this test can be used) for the duration of the  Covid-19 declaration under Section 564(b)(1) of the Act, 21  U.S.C. section 360bbb-3(b)(1), unless the authorization is  terminated or revoked. Performed at [facility], [address] [place], [place] [address]   Aerobic/Anaerobic Culture (surgical/deep wound)     Status: None   Collection Time: 01/28/20  1:06 PM   Specimen: PATH Soft tissue  Result Value Ref Range Status   Specimen Description   Final    TISSUE Performed at [facility], [address] [place], [place] [address]    Special Requests   Final    NONE Performed at [facility], [address] [place], [place] [address]    Gram Stain   Final    RARE WBC PRESENT,BOTH PMN AND MONONUCLEAR NO ORGANISMS SEEN Performed at [facility], [place] [address], [place],  [address]    Culture   Final    RARE ENTEROCOCCUS FAECIUM NO ANAEROBES ISOLATED VANCOMYCIN RESISTANT ENTEROCOCCUS ISOLATED    Report Status 02/03/2020 FINAL  Final   Organism ID, Bacteria ENTEROCOCCUS FAECIUM  Final  Susceptibility   Enterococcus faecium - MIC*    AMPICILLIN >=32 RESISTANT Resistant     VANCOMYCIN >=32 RESISTANT Resistant     GENTAMICIN SYNERGY SENSITIVE Sensitive     LINEZOLID 2 SENSITIVE Sensitive     * RARE ENTEROCOCCUS FAECIUM  Aerobic/Anaerobic Culture (surgical/deep wound)     Status: None   Collection Time: 01/28/20  1:54 PM   Specimen: Wound; Body Fluid  Result Value Ref Range Status   Specimen Description   Final    WOUND Performed at [facility], [address] [place], [place] [address]    Special Requests   Final    NONE Performed at [facility], [address] [place], [place] [address]    Gram Stain   Final    FEW WBC PRESENT, PREDOMINANTLY PMN NO  ORGANISMS SEEN    Culture   Final    No growth aerobically or anaerobically. Performed at [facility], [place] [address] [place], [place] [address]    Report Status 02/02/2020 FINAL  Final  Aerobic/Anaerobic Culture (surgical/deep wound)     Status: None   Collection Time: 01/28/20  1:55 PM   Specimen: PATH Soft tissue  Result Value Ref Range Status   Specimen Description   Final    TISSUE Performed at [facility], [address], [place], [place] [address]    Special Requests   Final    NONE Performed at [facility], [address] [place], [place] [address]    Gram Stain   Final    RARE WBC PRESENT, PREDOMINANTLY PMN FEW GRAM POSITIVE COCCI IN PAIRS FEW GRAM VARIABLE ROD    Culture   Final    ABUNDANT ENTEROCOCCUS FAECIUM FEW CANDIDA ALBICANS FEW BACTEROIDES THETAIOTAOMICRON BETA LACTAMASE POSITIVE Performed at [facility], [place] [address] [place], [place] [address]    Report Status 02/01/2020 FINAL  Final   Organism ID, Bacteria ENTEROCOCCUS FAECIUM  Final      Susceptibility   Enterococcus faecium - MIC*    AMPICILLIN >=32 RESISTANT Resistant     VANCOMYCIN >=32 RESISTANT Resistant     GENTAMICIN SYNERGY SENSITIVE Sensitive     LINEZOLID 2 SENSITIVE Sensitive     * ABUNDANT ENTEROCOCCUS FAECIUM  Aerobic/Anaerobic Culture (surgical/deep wound)     Status: None   Collection Time: 01/28/20  2:17 PM   Specimen: PATH Bone biopsy; Tissue  Result Value Ref Range Status   Specimen Description   Final    BIOPSY Performed at [facility], [address] [place], [place] [address]    Special Requests   Final    NONE Performed at [facility], [place], [place] [address]    Gram Stain NO WBC SEEN NO ORGANISMS SEEN   Final   Culture   Final    RARE VANCOMYCIN RESISTANT ENTEROCOCCUS ISOLATED NO ANAEROBES ISOLATED Performed at [facility], [place] [address], [place], [place] [address]    Report  Status 02/02/2020 FINAL  Final   Organism ID, Bacteria VANCOMYCIN RESISTANT ENTEROCOCCUS ISOLATED  Final      Susceptibility   Vancomycin resistant enterococcus isolated - MIC*    AMPICILLIN >=32 RESISTANT Resistant     VANCOMYCIN >=32 RESISTANT Resistant     GENTAMICIN SYNERGY SENSITIVE Sensitive     LINEZOLID 2 SENSITIVE Sensitive     * RARE VANCOMYCIN RESISTANT ENTEROCOCCUS ISOLATED  MRSA PCR Screening     Status: None  Collection Time: 01/31/20  8:51 PM   Specimen: Nasal Mucosa; Nasopharyngeal  Result Value Ref Range Status   MRSA by PCR NEGATIVE NEGATIVE Final    Comment:        The GeneXpert MRSA Assay (FDA approved for NASAL specimens only), is one component of a comprehensive MRSA colonization surveillance program. It is not intended to diagnose MRSA infection nor to guide or monitor treatment for MRSA infections. Performed at [facility], [address], [place], [place] [address]          Radiology Studies: No results found.      Scheduled Meds: . [MAR Hold] amiodarone  100 mg Per Tube BID  . [MAR Hold] benztropine  0.5 mg Per Tube BID  . [MAR Hold] carbidopa-levodopa  1 tablet Per Tube BID  . [MAR Hold] diltiazem  30 mg Per Tube QID  . [MAR Hold] famotidine  20 mg Per Tube Daily  . fluconazole  200 mg Per Tube Q24H  . [MAR Hold] free water  150 mL Per Tube Q2H  . [MAR Hold] hydrALAZINE  50 mg Per Tube BID  . [MAR Hold] insulin aspart  0-9 Units Subcutaneous Q4H  . [MAR Hold] ipratropium-albuterol  3 mL Nebulization TID  . [MAR Hold] metoprolol tartrate  12.5 mg Per Tube BID  . [MAR Hold] sodium chloride flush  10 mL Intravenous Q12H  . sodium chloride flush      . [MAR Hold] thiamine  200 mg Per Tube Daily   Continuous Infusions: . [MAR Hold] sodium chloride 250 mL (02/04/20 2030)  . [MAR Hold] ampicillin-sulbactam (UNASYN) IV 3 g (02/05/20 0615)  . [MAR Hold] DAPTOmycin (CUBICIN)  IV 700 mg (02/04/20 2040)     LOS: 8 days    Time  spent: 15 minutes    Ezekiel Slocumb, DO [facility]   If 7PM-7AM, please contact night-coverage [email or web address] 02/05/2020, 1:11 PM

## 2020-02-05 NOTE — Transfer of Care (Signed)
Immediate Anesthesia Transfer of Care Note  Patient: Kyle [name]  Procedure(s) Performed: IRRIGATION AND DEBRIDEMENT WOUND (N/A ) WOUND VAC Change Out (N/A )  Patient Location: PACU  Anesthesia Type:General  Level of Consciousness: drowsy  Airway & Oxygen Therapy: Patient Spontanous Breathing and Patient connected to face mask oxygen  Post-op Assessment: Report given to RN and Post -op Vital signs reviewed and stable  Post vital signs: Reviewed and stable  Last Vitals:  Vitals Value Taken Time  BP 158/71 02/05/20 1303  Temp 37.3 C 02/05/20 1301  Pulse 69 02/05/20 1308  Resp 29 02/05/20 1308  SpO2 100 % 02/05/20 1308  Vitals shown include unvalidated device data.  Last Pain:  Vitals:   02/05/20 1301  TempSrc:   PainSc: Asleep         Complications: No apparent anesthesia complications

## 2020-02-05 NOTE — Progress Notes (Addendum)
Nutrition Follow Up Note   DOCUMENTATION CODES:   Not applicable  INTERVENTION:   Continue Jevity 1.5 @ 83m/hr + Prostat 353mdaily via tube   Free water flushes 12530m2 hours per MD  Regimen provides 2260kcal/day, 107g/day protein, 30g/day fiber, 2594m51my free water  Juven Fruit Punch BID via tube, each serving provides 95kcal and 2.5g of protein (amino acids glutamine and arginine)  Daily weights   NUTRITION DIAGNOSIS:   Inadequate oral intake related to dysphagia as evidenced by NPO status, other (comment)(pt with PEG tube).  GOAL:   Patient will meet greater than or equal to 90% of their needs  -Met with tube feeds  MONITOR:   Labs, Weight trends, TF tolerance, Skin, I & O's  ASSESSMENT:   73 y66. 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 Pt s/p I & D and VAC placement 3/7   Pt s/p I & D 3/9, 3/11 and today. Pt tolerating tube feeds well at goal rate. Free water increased by MD. Now new weight since admit; will request daily weights.   Medications reviewed and include: pepcid, insulin, juven, thiamine, unasyn, daptomycin   Labs reviewed: Na 145 wnl Hgb 7.7(L), Hct 26.3(L)  Diet Order:   Diet Order            Diet NPO time specified  Diet effective midnight             EDUCATION NEEDS:   Not appropriate for education at this time  Skin:  Skin Assessment: Reviewed RN Assessment(sacral decubitus wound (15 cm x 15 cm) and infected left hip/leg decubitus wound (10 cm x 5 cm), and debridement of skin and subcutaneous tissue of infected left ischial decubitus wound (2 cm x 2 cm),  for total area of 279 sqcm.)  Last BM:  3/15- type 7  Height:   Ht Readings from Last 1 Encounters:  01/27/20 '5\' 9"'$  (1.753 m)    Weight:   Wt Readings from Last 1 Encounters:  01/27/20 86.2 kg    Ideal Body Weight:  72.7 kg  BMI:  Body mass index is 28.06 kg/m.  Estimated Nutritional Needs:   Kcal:  1900-2200kcal/day  Protein:  95-110g/day  Fluid:  >1.8L/day  CaseKoleen Distance RD, LDN Contact information available in Amion

## 2020-02-05 NOTE — Progress Notes (Signed)
PT Cancellation Note  Patient Details Name: Kyle [name] MRN: [id number] DOB: [birth year]   Cancelled Treatment:    Reason Eval/Treat Not Completed: Other (comment)(Pt not available, PT to re-attempt as able.)  Olga Coaster PT, DPT 12:00 PM,02/05/20

## 2020-02-05 NOTE — Progress Notes (Signed)
Wound vac dressing removed, debridement performed, new vac dressing placed

## 2020-02-05 NOTE — Plan of Care (Addendum)
VSS. NSR on tele. Denies any pain. Contact isolation maintained. Assessment as documented. IV antibiotics continued per order. Tube feed infusing. Medications given crushed via PEG tube. Tube flushes well. NPO maintained. Frequent oral care provided. Wound vac in place to ordered settings. External catheter in place to suction. Q2 turns maintained. Pt tolerates well. POC reviewed. Needs frequent reinforcement. Sepsis screen not applicable d/t current antibiotic use.   Problem: Education: Goal: Knowledge of General Education information will improve Description: Including pain rating scale, medication(s)/side effects and non-pharmacologic comfort measures Outcome: Progressing   Problem: Health Behavior/Discharge Planning: Goal: Ability to manage health-related needs will improve Outcome: Progressing   Problem: Clinical Measurements: Goal: Ability to maintain clinical measurements within normal limits will improve Outcome: Progressing Goal: Will remain free from infection Outcome: Progressing Goal: Diagnostic test results will improve Outcome: Progressing Goal: Respiratory complications will improve Outcome: Progressing Goal: Cardiovascular complication will be avoided Outcome: Progressing   Problem: Activity: Goal: Risk for activity intolerance will decrease Outcome: Progressing   Problem: Nutrition: Goal: Adequate nutrition will be maintained Outcome: Progressing   Problem: Coping: Goal: Level of anxiety will decrease Outcome: Progressing   Problem: Elimination: Goal: Will not experience complications related to bowel motility Outcome: Progressing Goal: Will not experience complications related to urinary retention Outcome: Progressing   Problem: Pain Managment: Goal: General experience of comfort will improve Outcome: Progressing   Problem: Safety: Goal: Ability to remain free from injury will improve Outcome: Progressing   Problem: Skin Integrity: Goal: Risk for  impaired skin integrity will decrease Outcome: Progressing

## 2020-02-05 NOTE — Progress Notes (Signed)
Wound vac dressing removed, wound debrided, new vac dressing placed

## 2020-02-05 NOTE — Progress Notes (Signed)
OT Cancellation Note  Patient Details Name: Gal Feldhaus MRN: [id number] DOB: November 23, 1946   Cancelled Treatment:    Reason Eval/Treat Not Completed: Patient at procedure or test/ unavailable  Chart reviewed and unable to see patient who was just taken down to OR for wound vac surgery.  Will follow up with patient tomorrow and review medical status and orders to continue OT.  Susanne Borders, OTR/L, NTMTC ascom [phone] 02/05/20, 10:38 AM

## 2020-02-05 NOTE — OR Nursing (Signed)
Dr. Randa Ngo aware of pt cbg 76, no new orders given at this time

## 2020-02-05 NOTE — Care Management Important Message (Signed)
Important Message  Patient Details  Name: Kyle [name] MRN: [id number] Date of Birth: 01-15-1946   Medicare Important Message Given:  Yes  Reviewed verbally over phone with sister.     [name] 02/05/2020, 1:28 PM

## 2020-02-06 LAB — CBC
HCT: 28.1 % — ABNORMAL LOW (ref 39.0–52.0)
Hemoglobin: 8.6 g/dL — ABNORMAL LOW (ref 13.0–17.0)
MCH: 33.2 pg (ref 26.0–34.0)
MCHC: 30.6 g/dL (ref 30.0–36.0)
MCV: 108.5 fL — ABNORMAL HIGH (ref 80.0–100.0)
Platelets: 323 10*3/uL (ref 150–400)
RBC: 2.59 MIL/uL — ABNORMAL LOW (ref 4.22–5.81)
RDW: 15.2 % (ref 11.5–15.5)
WBC: 7.4 10*3/uL (ref 4.0–10.5)
nRBC: 0.3 % — ABNORMAL HIGH (ref 0.0–0.2)

## 2020-02-06 LAB — BASIC METABOLIC PANEL
Anion gap: 10 (ref 5–15)
BUN: 18 mg/dL (ref 8–23)
CO2: 26 mmol/L (ref 22–32)
Calcium: 9.5 mg/dL (ref 8.9–10.3)
Chloride: 108 mmol/L (ref 98–111)
Creatinine, Ser: 0.48 mg/dL — ABNORMAL LOW (ref 0.61–1.24)
GFR calc Af Amer: >60 mL/min (ref >60)
GFR calc non Af Amer: >60 mL/min (ref >60)
Glucose, Bld: 123 mg/dL — ABNORMAL HIGH (ref 70–99)
Potassium: 4.1 mmol/L (ref 3.5–5.1)
Sodium: 144 mmol/L (ref 135–145)

## 2020-02-06 LAB — GLUCOSE, CAPILLARY
Glucose-Capillary: 124 mg/dL — ABNORMAL HIGH (ref 70–99)
Glucose-Capillary: 105 mg/dL — ABNORMAL HIGH (ref 70–99)
Glucose-Capillary: 126 mg/dL — ABNORMAL HIGH (ref 70–99)
Glucose-Capillary: 104 mg/dL — ABNORMAL HIGH (ref 70–99)
Glucose-Capillary: 113 mg/dL — ABNORMAL HIGH (ref 70–99)

## 2020-02-06 LAB — MAGNESIUM: Magnesium: 2.5 mg/dL — ABNORMAL HIGH (ref 1.7–2.4)

## 2020-02-06 MED ORDER — FREE WATER
175.0000 mL | Status: DC
  Administered 2020-02-06 – 2020-02-14 (×64): 175 mL

## 2020-02-06 NOTE — Progress Notes (Signed)
[facility] SURGICAL PROGRESS NOTE  Hospital Day(s): 9.   Post op day(s): 1 Day Post-Op.   Interval History:  Patient seen and examined no acute events or new complaints overnight.  Patient does not provide much history secondary to dementia, he does deny any pain No recorded fevers No leukocytosis, anemia improved Continues on IV Unasyn and Daptomycin  Vital signs in last 24 hours: [min-max] current  Temp:  [97.6 F (36.4 C)-99.2 F (37.3 C)] 97.9 F (36.6 C) (03/16 0435) Pulse Rate:  [64-85] 85 (03/16 0454) Resp:  [15-29] 20 (03/15 1708) BP: (131-173)/(66-83) 162/79 (03/16 0454) SpO2:  [93 %-100 %] 95 % (03/16 0435)     Height: 5\' 9"  (175.3 cm) Weight: 86.2 kg BMI (Calculated): 28.05   Intake/Output last 2 shifts:  03/15 0701 - 03/16 0700 In: 1240 [I.V.:500; NG/GT:275; IV Piggyback:315] Out: 403 [Urine:400; Blood:3]   Physical Exam:  Constitutional: alert, cooperative and no distress  Respiratory: breathing non-labored at rest  Cardiovascular: regular rate and sinus rhythm  Integumentary:Wound vac to the sacrum and left trochanteric which are bridged together and then this is bridged further to the left anterior thigh, good seal, output serosanguinous   Labs:  CBC Latest Ref Rng & Units 02/06/2020 02/05/2020 02/04/2020  WBC 4.0 - 10.5 K/uL 7.4 7.6 7.6  Hemoglobin 13.0 - 17.0 g/dL 02/06/2020) 7.7(L) 8.3(L)  Hematocrit 39.0 - 52.0 % 28.1(L) 26.3(L) 28.3(L)  Platelets 150 - 400 K/uL 323 356 346   CMP Latest Ref Rng & Units 02/06/2020 02/05/2020 02/04/2020  Glucose 70 - 99 mg/dL 02/06/2020) 80 546(E)  BUN 8 - 23 mg/dL 18 21 19   Creatinine 0.61 - 1.24 mg/dL 703(J) ) 0.09(F)  Sodium 135 - 145 mmol/L 144 145 151(H)  Potassium 3.5 - 5.1 mmol/L 4.1 3.9 4.2  Chloride 98 - 111 mmol/L 108 110 114(H)  CO2 22 - 32 mmol/L 26 26 29   Calcium 8.9 - 10.3 mg/dL 9.5 9.6 8.18(E  Total Protein 6.5 - 8.1 g/dL - - -  Total Bilirubin 0.3 - 1.2 mg/dL - - -  Alkaline Phos 38 - 126  U/L - - -  AST 15 - 41 U/L - - -  ALT 0 - 44 U/L - - -     Imaging studies: No new pertinent imaging studies   Assessment/Plan:  74 y.o. male 1 Day Post-Op s/p fourth debridement of sacral pressure ulcer and placement of wound VAC greater than 50 cm   - Will plan on bedside wound vac change tomorrow (02/07/2020)             - Adjust ABx per culture results; vancomycin resistant enterococcus faecium; now on Unasyn and Daptomycin             - Continue IVF resuscitation                - agree palliative care consult would be reasonable              - Continue frequent positioning changes, offloading, low air loss mattress              - further management per primary team  All of the above findings and recommendations were discussed with the medical team  -- 16.9, PA-C  [facility] 02/06/2020, 7:49 AM [phone] M-F: 7am - 4pm

## 2020-02-06 NOTE — Progress Notes (Signed)
Physical Therapy Treatment Patient Details Name: Kyle [name] MRN: [id number] DOB: Apr 09, 1946 Today's Date: 02/06/2020    History of Present Illness Kyle [name] is a 74 y.o. male with medical history significant of COVID 11/02/2019.  CVA, prediabetes, PE around 2018 started on Xarelto, chronic tremors of bilateral hands, bipolar disorder, morbid obesity CKD HLD neuropathy chronic atrial fibrillation DM2 COPD, Parkinson disease. He has been in LTAC and was discharged to SNF on 01/25/20. Was febrile when arrived to SNF and sent here to ED.    PT Comments    Patient easily woken at start of session, did need many multimodal cues to continue to attend to task. Session focused on PROM/AAROM, with fatigue pt unable to keep eyes open, and PT noticed a decrease in pt muscle activation with fatigue. UE and LE addressed, LE weaker than UE. Pt unable to tolerate bilateral finger extension, significant increase in grimacing noted. Pt repositioned, lights left on to encourage pt to be awake during the day. The patient would benefit from further skilled PT intervention to progress towards goals as able and maximize function.     Follow Up Recommendations  SNF     Equipment Recommendations  Other (comment)(TBD by next venue of care)    Recommendations for Other Services       Precautions / Restrictions Precautions Precautions: Fall Precaution Comments: sacral wound and wound vac Restrictions Weight Bearing Restrictions: No Other Position/Activity Restrictions: feeding tube, NPO    Mobility  Bed Mobility                  Transfers                 General transfer comment: unsafe to attempt  Ambulation/Gait                 Stairs             Wheelchair Mobility    Modified Rankin (Stroke Patients Only)       Balance                                            Cognition Arousal/Alertness: Awake/alert Behavior During Therapy: Flat  affect Overall Cognitive Status: Within Functional Limits for tasks assessed                                 General Comments: Pt verbalizes 2-3 times during session, very soft spoken      Exercises General Exercises - Upper Extremity Shoulder Flexion: AAROM;Both;10 reps;Strengthening Elbow Flexion: AAROM;Both;10 reps;Strengthening Elbow Extension: PROM;Strengthening;Both;10 reps Wrist Flexion: PROM;Both;10 reps Wrist Extension: PROM;10 reps;Strengthening;Both Composite Extension: (Pt unable to tolerate) General Exercises - Lower Extremity Ankle Circles/Pumps: AAROM;Right;10 reps;PROM;Left Short Arc Quad: AAROM;Strengthening;Both;10 reps Heel Slides: AAROM;Strengthening;Both;10 reps Hip ABduction/ADduction: PROM;Strengthening;Both;10 reps Straight Leg Raises: PROM;Strengthening;Both;10 reps    General Comments        Pertinent Vitals/Pain Pain Assessment: Faces Faces Pain Scale: Hurts little more Pain Location: grimaces with finger extension, bilateral knee flexion Pain Descriptors / Indicators: Moaning;Grimacing;Guarding Pain Intervention(s): Limited activity within patient's tolerance;Monitored during session;Repositioned    Home Living                      Prior Function            PT Goals (current goals  can now be found in the care plan section) Progress towards PT goals: Progressing toward goals(slowly)    Frequency    Min 2X/week      PT Plan Current plan remains appropriate    Co-evaluation              AM-PAC PT "6 Clicks" Mobility   Outcome Measure  Help needed turning from your back to your side while in a flat bed without using bedrails?: Total Help needed moving from lying on your back to sitting on the side of a flat bed without using bedrails?: Total Help needed moving to and from a bed to a chair (including a wheelchair)?: Total Help needed standing up from a chair using your arms (e.g., wheelchair or bedside  chair)?: Total Help needed to walk in hospital room?: Total Help needed climbing 3-5 steps with a railing? : Total 6 Click Score: 6    End of Session   Activity Tolerance: Patient tolerated treatment well Patient left: in bed;with call bell/phone within reach;with bed alarm set Nurse Communication: Mobility status PT Visit Diagnosis: Other abnormalities of gait and mobility (R26.89);Muscle weakness (generalized) (M62.81)     Time: 6168-3729 PT Time Calculation (min) (ACUTE ONLY): 23 min  Charges:  $Therapeutic Exercise: 23-37 mins                     Lieutenant Diego PT, DPT 11:38 AM,02/06/20

## 2020-02-06 NOTE — Progress Notes (Signed)
PROGRESS NOTE    Corran Lalone  ELF:[id number] DOB: Mar 12, 1946 DOA: 01/27/2020  PCP: [facility]    LOS - 9   Brief Narrative:  [name] a 74 y.o.malewith medical history significant of COVID12/08/2019. CVA, prediabetes, PE around 2018 started on Xarelto, chronic tremors of bilateral hands, bipolar disorder, morbid obesity,CKD, HLD, neuropathy, chronic atrial fibrillation, DM2,COPD, and Parkinson disease.He presented withfever from SNF. He had been discharged from SNF after a 10 day inpatient stay for acute hypoxemic respiratory failure due to COVID-19. During this stay the patient was treated with Remdesivir, decadron, Rocephin and azithromycin.  In December patient was admitted for Covid with acute respiratory hypoxemic failure treated remdesivir 10 days of Decadron and Rocephin and azithromycin for possible bacterial superinfection in [place]. This was followed by a roughly three week stay at [facility]. This was followed by a three day stay at [facility].   His hospital stay in [place] was complicated by tachycardia patient was seen by cardiology started Cardizem metoprolol.The patient suffered respiratory arrest on 24 November 2019 and had to be intubated.He had an EEG showing slowing consistent with mild residual encephalopathy patient required tracheostomy and PEG tube placement. He later on developed pressure sacral decubitus ulcer which required debridement.He required broad-spectrum antibiotics IV vancomycin and Zosyn. He thentransferred to [name] in House.His antibiotics were later simplified to cefepime. His trach was de cannulated.Patient remains nonverbal.In February he developed pneumonia worrisome for aspiration and was treated Unasyn.Hefailed trial of decannulation plan for him to have a ENT consult done to place an hour trach he had copious secretion requiring frequent suctioning as well as nasotracheal  suctioning.Patient was finally yesterday discharged from [facility] to SNF3/02/2020, but continued to be febrile.As soon as he presented to facility and was transferred to [facility] emergency department today.  The patient was admitted to a telemetry bed. General surgery and Infectious disease were consulted, and the patient went for operative I & D on 01/28/2020. Cultures were taken. He is receiving IV  The patient has been admitted to a telemetry bed. General surgery has been consulted and has taken the patient for operative I&D of the sacral wound on 01/28/2020. Cultures were taken. Infectious disease has also been consulted.The patient is receiving IV Zosyn and Vancomycin. Nutrition has been consulted and the patient has been restarted on tube feeds. SLP has been consulted for swallow eval, although this has not yet taken place as the patient has been in procedures. Cultures have grown enterococcus and possible anaerobes. Infectious disease feels that it will be unlikely that these wounds will heal given the patient's poor nutritional and mobility status. He has recommended palliative care consult.Returned to the OR again3/9, 3/11and 3/15 for further debridement.  Subjective 3/15: Patient awake when seen and examined this morning.  No acute events reported overnight.  Today he reports that he is sleepy.  Denies pain or other acute complaints.  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 Lactic acid was 1.7 and procalcitonin was 0.28. Initially this was  felt to be due to pneumonia. However the patient had received  treatment with Unasyn for pneumonia in February. CXR demonstrated persistent radiological evidence of pneumonia, but clinically the patient has no adventitious lung sounds, no cough, and he is saturating 98% on room air. However, CT does show evidence of Stage IV decubitus sacral ulcer with abscess and possible bone involvement. He has gone for multiple surgical debridements with general surgerythis admission.   Stage IV sacral decubitus ulcerwith abscess and possible bony involvement.  S/P I & D with general surgeryx 3. The patient has a history of shrapnel, sowould not be a good candidate for MRI.CRP elevated at 9.7. ESR is 126.Albumin 2.1. Culture has grown outVanc resistantE. Faecium and possible anaerobe. --ID consulted, appreciate recommendations --ContinueonIV Unasyn and Daptomycin  --unlikely sacral wound will heal with poor nutritional and functional status, palliative consulted --general surgery following --wound vac change at bedside tomorrow morning --palliative care following, patient will be followed by outpatient palliative at SNF after discharge --patient to be discharged with wound VAC  ?Recurrent aspiration pneumonia versus HCAP:resolved ENT was able to remove a large piece of retained material from vallecula on 3/11. --Speech evaluations, ongoing --continue tube feeds by PEG tube  Dysphagia:mgmt as above  Hypernatremia: Resolved.  Napeaked to 153. Getting free waterflushes by PEG tube. --Free water flushes: 175 cc every 3 hours --Adjust free water as needed to maintain normal sodium level --recheck BMP in AM  Hypertension: Chronic. Continue home medications.  Severe protein calorie malnutrition: Nutrition consult appreciated. This severely impacts the patient's ability to heal these wounds.  Anemia: Likely due to chronic disease.  Stable and no evidence of bleeding. --Monitor  CBC  Chronic atrial fibrillation(HCC)-The patient has a CHADS2VASC score of >3.  Rate controlled. --Resume eliquis when cleared by general surgery and no more OR trips planned.  --Continue metoprolol and amiodarone   Type 2 Diabetes: Hb A1c is 4.2. Glucoses to be followed by FSBS and sensitive SSI.  --Oral antihyperglycemics have been held.  --carbohydrate controlled diet/tube feeds and hypoglycemic protocol.  Parkinson disease: Noted. Likely playing a part in patient's history of aspiration. --ContinueCogentin, Sinemet  Encephalopathy:Status post hypoxic respiratory arrest requiring intubation in the setting of Covid in December 2020.Patient with some minimal improvements per family started to respond to his name at times but mainly remains nonverbal bedbound incontinent status post PEG tube. Lurline Idol has been decannulated perLTAC notes if patient has significant secretions may need to replace tracheostomy. At this point appears to be stable.    DVT prophylaxis:SCD's Code Status: DNR Family Communication:none at bedside during encounter  Disposition Plan:To SNF pending improvement and clearance by general surgery. Patient undergoing further surgical debridement in the OR with surgery today.  Plans for additional surgery will depend on findings today.  If no further surgical debridement deems necessary, likely patient can be discharged to rehab once wound vac and supplies available at facility. Coming FromSNF Exp DC Date 3/16 Barriersas above Medically Stable for Discharge?No  Consultants:  General Surgery  Infectious Disease  Palliative Care  Procedures:  Surgical debridement of sacral decubitus ulcer x 2  Antimicrobials:  Unasyn, Daptomycin  Objective: Vitals:   02/06/20 0748 02/06/20 0826 02/06/20 1201 02/06/20 1633  BP:  (!) 153/72 (!) 147/69 (!) 154/66  Pulse:  75 70 68  Resp:  _0 Temp:  98.4 F (36.9 C) 98.4 F (36.9  C) 98.6 F (37 C)  TempSrc:  Oral Oral Oral  SpO2: 98% 98% 98% 99%  Weight:      Height:  Intake/Output Summary (Last 24 hours) at 02/06/2020 1839 Last data filed at 02/06/2020 0552 Gross per 24 hour  Intake 640 ml  Output 400 ml  Net 240 ml   Filed Weights   01/27/20 2220  Weight: 86.2 kg    Examination:  General exam: Awake and alert, no acute distress, obese Respiratory system: CTAB anteriorly, no wheezes, rales or rhonchi, normal respiratory effort. Cardiovascular system: normal S1/S2, RRR, no pedal edema.   Gastrointestinal system: soft, NT, tube feeds running, wound VAC in place    Data Reviewed: I have personally reviewed following labs and imaging studies  CBC: Recent Labs  Lab 02/02/20 0831 02/03/20 0556 02/04/20 0640 02/05/20 0440 02/06/20 0630  WBC 7.4 7.0 7.6 7.6 7.4  HGB 8.2* 8.4* 8.3* 7.7* 8.6*  HCT 28.4* 29.3* 28.3* 26.3* 28.1*  MCV 114.5* 114.9* 111.9* 110.5* 108.5*  PLT 389 388 346 356 161   Basic Metabolic Panel: Recent Labs  Lab 02/02/20 0831 02/03/20 0556 02/04/20 0640 02/05/20 0440 02/06/20 0630  NA 153* 152* 151* 145 144  K 4.0 3.8 4.2 3.9 4.1  CL 118* 116* 114* 110 108  CO2 _0 GLUCOSE 125* 128* 117* 80 123*  BUN _1 CREATININE 0.38* 0.42* 0.45* 0.44* 0.48*  CALCIUM 10.7* 10.3 10.2 9.6 9.5  MG 3.0* 2.8* 2.9* 2.6* 2.5*   GFR: Estimated Creatinine Clearance: 90.8 mL/min (A) (by C-G formula based on SCr of 0.48 mg/dL (L)). Liver Function Tests: No results for input(s): AST, ALT, ALKPHOS, BILITOT, PROT, ALBUMIN in the last 168 hours. No results for input(s): LIPASE, AMYLASE in the last 168 hours. No results for input(s): AMMONIA in the last 168 hours. Coagulation Profile: No results for input(s): INR, PROTIME in the last 168 hours. Cardiac Enzymes: Recent Labs  Lab 02/01/20 0458  CKTOTAL 27*   BNP (last 3 results) No results for input(s): PROBNP in the last 8760 hours. HbA1C: No results for  input(s): HGBA1C in the last 72 hours. CBG: Recent Labs  Lab 02/05/20 2353 02/06/20 0430 02/06/20 0825 02/06/20 1202 02/06/20 1632  GLUCAP 117* 124* 105* 126* 104*   Lipid Profile: No results for input(s): CHOL, HDL, LDLCALC, TRIG, CHOLHDL, LDLDIRECT in the last 72 hours. Thyroid Function Tests: No results for input(s): TSH, T4TOTAL, FREET4, T3FREE, THYROIDAB in the last 72 hours. Anemia Panel: No results for input(s): VITAMINB12, FOLATE, FERRITIN, TIBC, IRON, RETICCTPCT in the last 72 hours. Sepsis Labs: No results for input(s): PROCALCITON, LATICACIDVEN in the last 168 hours.  Recent Results (from the past 240 hour(s))  Blood Culture (routine x 2)     Status: None   Collection Time: 01/27/20 10:42 PM   Specimen: BLOOD  Result Value Ref Range Status   Specimen Description BLOOD BLOOD LEFT HAND  Final   Special Requests   Final    BOTTLES DRAWN AEROBIC AND ANAEROBIC Blood Culture adequate volume   Culture   Final    NO GROWTH 5 DAYS Performed at [facility], [address] [place], [place] [address]    Report Status 02/01/2020 FINAL  Final  Blood Culture (routine x 2)     Status: None   Collection Time: 01/27/20 10:42 PM   Specimen: BLOOD  Result Value Ref Range Status   Specimen Description BLOOD BLOOD LEFT HAND  Final   Special Requests   Final    BOTTLES DRAWN AEROBIC AND ANAEROBIC Blood Culture adequate volume   Culture   Final  NO GROWTH 5 DAYS Performed at [facility], [address] [place], [place] [address]    Report Status 02/01/2020 FINAL  Final  Respiratory Panel by RT PCR (Flu A&B, Covid) - Nasopharyngeal Swab     Status: None   Collection Time: 01/27/20 11:57 PM   Specimen: Nasopharyngeal Swab  Result Value Ref Range Status   SARS Coronavirus 2 by RT PCR NEGATIVE NEGATIVE Final    Comment: (NOTE) SARS-CoV-2 target nucleic acids are NOT DETECTED. The SARS-CoV-2 RNA is generally detectable in upper respiratoy specimens during  the acute phase of infection. The lowest concentration of SARS-CoV-2 viral copies this assay can detect is 131 copies/mL. A negative result does not preclude SARS-Cov-2 infection and should not be used as the sole basis for treatment or other patient management decisions. A negative result may occur with  improper specimen collection/handling, submission of specimen other than nasopharyngeal swab, presence of viral mutation(s) within the areas targeted by this assay, and inadequate number of viral copies (<131 copies/mL). A negative result must be combined with clinical observations, patient history, and epidemiological information. The expected result is Negative. Fact Sheet for Patients:  PinkCheek.be Fact Sheet for Healthcare Providers:  GravelBags.it This test is not yet ap proved or cleared by the [place] FDA and  has been authorized for detection and/or diagnosis of SARS-CoV-2 by FDA under an Emergency Use Authorization (EUA). This EUA will remain  in effect (meaning this test can be used) for the duration of the COVID-19 declaration under Section 564(b)(1) of the Act, 21 U.S.C. section 360bbb-3(b)(1), unless the authorization is terminated or revoked sooner.    Influenza A by PCR NEGATIVE NEGATIVE Final   Influenza B by PCR NEGATIVE NEGATIVE Final    Comment: (NOTE) The Xpert Xpress SARS-CoV-2/FLU/RSV assay is intended as an aid in  the diagnosis of influenza from Nasopharyngeal swab specimens and  should not be used as a sole basis for treatment. Nasal washings and  aspirates are unacceptable for Xpert Xpress SARS-CoV-2/FLU/RSV  testing. Fact Sheet for Patients: PinkCheek.be Fact Sheet for Healthcare Providers: GravelBags.it This test is not yet approved or cleared by the [place] FDA and  has been authorized for detection and/or diagnosis of  SARS-CoV-2 by  FDA under an Emergency Use Authorization (EUA). This EUA will remain  in effect (meaning this test can be used) for the duration of the  Covid-19 declaration under Section 564(b)(1) of the Act, 21  U.S.C. section 360bbb-3(b)(1), unless the authorization is  terminated or revoked. Performed at [facility], [address] [place], [place] [address]   Aerobic/Anaerobic Culture (surgical/deep wound)     Status: None   Collection Time: 01/28/20  1:06 PM   Specimen: PATH Soft tissue  Result Value Ref Range Status   Specimen Description   Final    TISSUE Performed at [facility], [address], [place], [place] [address]    Special Requests   Final    NONE Performed at [facility], [address] [place], [place] [address]    Gram Stain   Final    RARE WBC PRESENT,BOTH PMN AND MONONUCLEAR NO ORGANISMS SEEN Performed at [facility], [place] [address] [place], [place] [address]    Culture   Final    RARE ENTEROCOCCUS FAECIUM NO ANAEROBES ISOLATED VANCOMYCIN RESISTANT ENTEROCOCCUS ISOLATED    Report Status 02/03/2020 FINAL  Final   Organism ID, Bacteria ENTEROCOCCUS FAECIUM  Final      Susceptibility  Enterococcus faecium - MIC*    AMPICILLIN >=32 RESISTANT Resistant     VANCOMYCIN >=32 RESISTANT Resistant     GENTAMICIN SYNERGY SENSITIVE Sensitive     LINEZOLID 2 SENSITIVE Sensitive     * RARE ENTEROCOCCUS FAECIUM  Aerobic/Anaerobic Culture (surgical/deep wound)     Status: None   Collection Time: 01/28/20  1:54 PM   Specimen: Wound; Body Fluid  Result Value Ref Range Status   Specimen Description   Final    WOUND Performed at [facility], [address] [place], [place] [address]    Special Requests   Final    NONE Performed at [facility], [address] [place], [place] [address]    Gram Stain   Final    FEW WBC PRESENT, PREDOMINANTLY PMN NO ORGANISMS SEEN    Culture   Final    No  growth aerobically or anaerobically. Performed at [facility], [place] [address], [place], [place] [address]    Report Status 02/02/2020 FINAL  Final  Aerobic/Anaerobic Culture (surgical/deep wound)     Status: None   Collection Time: 01/28/20  1:55 PM   Specimen: PATH Soft tissue  Result Value Ref Range Status   Specimen Description   Final    TISSUE Performed at [facility], [address], [place], [place] [address]    Special Requests   Final    NONE Performed at [facility], [address] [place], [place] [address]    Gram Stain   Final    RARE WBC PRESENT, PREDOMINANTLY PMN FEW GRAM POSITIVE COCCI IN PAIRS FEW GRAM VARIABLE ROD    Culture   Final    ABUNDANT ENTEROCOCCUS FAECIUM FEW CANDIDA ALBICANS FEW BACTEROIDES THETAIOTAOMICRON BETA LACTAMASE POSITIVE Performed at [facility], [place] [address], [place], [place] [address]    Report Status 02/01/2020 FINAL  Final   Organism ID, Bacteria ENTEROCOCCUS FAECIUM  Final      Susceptibility   Enterococcus faecium - MIC*    AMPICILLIN >=32 RESISTANT Resistant     VANCOMYCIN >=32 RESISTANT Resistant     GENTAMICIN SYNERGY SENSITIVE Sensitive     LINEZOLID 2 SENSITIVE Sensitive     * ABUNDANT ENTEROCOCCUS FAECIUM  Aerobic/Anaerobic Culture (surgical/deep wound)     Status: None   Collection Time: 01/28/20  2:17 PM   Specimen: PATH Bone biopsy; Tissue  Result Value Ref Range Status   Specimen Description   Final    BIOPSY Performed at [facility], [address] [place], [place] [address]    Special Requests   Final    NONE Performed at [facility], [place],  [address]    Gram Stain NO WBC SEEN NO ORGANISMS SEEN   Final   Culture   Final    RARE VANCOMYCIN RESISTANT ENTEROCOCCUS ISOLATED NO ANAEROBES ISOLATED Performed at [facility], [place] [address], [place],  [address]    Report Status 02/02/2020 FINAL  Final   Organism  ID, Bacteria VANCOMYCIN RESISTANT ENTEROCOCCUS ISOLATED  Final      Susceptibility   Vancomycin resistant enterococcus isolated - MIC*    AMPICILLIN >=32 RESISTANT Resistant     VANCOMYCIN >=32 RESISTANT Resistant     GENTAMICIN SYNERGY SENSITIVE Sensitive     LINEZOLID 2 SENSITIVE Sensitive     * RARE VANCOMYCIN RESISTANT ENTEROCOCCUS ISOLATED  MRSA PCR Screening     Status: None   Collection Time: 01/31/20  8:51 PM   Specimen: Nasal Mucosa; Nasopharyngeal  Result Value Ref Range Status   MRSA by PCR NEGATIVE NEGATIVE Final    Comment:        The GeneXpert MRSA Assay (FDA approved for NASAL specimens only), is one component of a comprehensive MRSA colonization surveillance program. It is not intended to diagnose MRSA infection nor to guide or monitor treatment for MRSA infections. Performed at [facility], [address] [place], [place] [address]   Aerobic/Anaerobic Culture (surgical/deep wound)     Status: None (Preliminary result)   Collection Time: 02/05/20 11:48 AM   Specimen: PATH Other; Tissue  Result Value Ref Range Status   Specimen Description TISSUE SACRAL  Final   Special Requests NONE  Final   Gram Stain   Final    RARE WBC PRESENT, PREDOMINANTLY PMN FEW GRAM POSITIVE COCCI IN PAIRS    Culture   Final    RARE ENTEROCOCCUS FAECIUM CULTURE REINCUBATED FOR BETTER GROWTH Performed at [facility], [place] [address], [place], [place] [address]    Report Status PENDING  Incomplete  Aerobic/Anaerobic Culture (surgical/deep wound)     Status: None (Preliminary result)   Collection Time: 02/05/20 11:48 AM   Specimen: PATH Other; Tissue  Result Value Ref Range Status   Specimen Description TISSUE  Final   Special Requests LEFT TROCHANTERIC  Final   Gram Stain   Final    RARE WBC PRESENT,BOTH PMN AND MONONUCLEAR FEW GRAM POSITIVE COCCI IN PAIRS    Culture   Final    FEW ENTEROCOCCUS FAECIUM CULTURE REINCUBATED FOR BETTER GROWTH Performed at  [facility], [place] [address], [place], [place] [address]    Report Status PENDING  Incomplete         Radiology Studies: No results found.      Scheduled Meds: . amiodarone  100 mg Per Tube BID  . benztropine  0.5 mg Per Tube BID  . carbidopa-levodopa  1 tablet Per Tube BID  . diltiazem  30 mg Per Tube QID  . famotidine  20 mg Per Tube Daily  . feeding supplement (PRO-STAT SUGAR FREE 64)  30 mL Per Tube Daily  . free water  175 mL Per Tube Q3H  . hydrALAZINE  50 mg Per Tube BID  . insulin aspart  0-9 Units Subcutaneous Q4H  . ipratropium-albuterol  3 mL Nebulization TID  . metoprolol tartrate  12.5 mg Per Tube BID  . nutrition supplement (JUVEN)  1 packet Per Tube BID BM  . sodium chloride flush  10 mL Intravenous Q12H  . thiamine  200 mg Per Tube Daily   Continuous Infusions: . sodium chloride 250 mL (02/04/20 2030)  . ampicillin-sulbactam (UNASYN) IV 3 g (02/06/20 1723)  . DAPTOmycin (CUBICIN)  IV Stopped (02/05/20 2129)  . feeding supplement (JEVITY 1.5 CAL/FIBER) 1,000 mL (02/05/20 1710)     LOS: 9 days    Time spent: 20 minutes    Ezekiel Slocumb, DO [facility]   If 7PM-7AM, please contact night-coverage [email or web address] 02/06/2020, 6:39 PM

## 2020-02-06 NOTE — Progress Notes (Signed)
Speech Language Pathology Treatment: Dysphagia  Patient Details Name: Kyle [name] MRN: [id number] DOB: [birth year] Today's Date: 02/06/2020 Time: 1455-1530 SLP Time Calculation (min) (ACUTE ONLY): 35 min  Assessment / Plan / Recommendation Clinical Impression  Pt seen today for ongoing assessment of swallowing; dysphagia tx targeting stimulation of swallowing. Continued discussion w/ NSG and pt about the importance of frequent oral care for hygiene and stimulation of swallowing. Pt awakened easily, he greeted this SLP. He was verbal and answered SLP's basic y/n questions w/ additional few words/comments about the [place] we had watched on tv. Noted slower processing and verbal responses, but they appeared accurate. Pt required full feeding assistance; positioning upright assistance. Reviewed chart notes; ENT was able to assess and remove via direct view and Glide Scope alarge mass of material was noted inpt's hypopharynx/Valleculae yesterday. Pt verbalized it felt "better" in his throat now. Noted continued involuntary lingual movements at rest. Upon attending to presented oral care and/or single ice chips, noted the lingual movements calmed. Post thorough oral care and moistening of mouth w/ oral gel, several single ice chips were given. Noted pt's immediate mastication of each chip and appropriate oral management of each chip. Pt appeared to maintain oral control of the trials; no anterior loss. Pt demonstrated fairly timely pharyngeal response/swallow w/ each trial; laryngeal excursion palpated. No overt, clinical s/s of aspiration noted - no decline in respiratory effort, no wet vocal quality b/t trials when assessed, no wet respirations. Pt stated the ice chips were "just right". He endorsed wanting "more" tomorrow. Pt's engagement w/ SLP was felt to be appropriate w/ pt exhibiting follow through w/ basic instructions. He seemed pleased w/ eating ice chips and smiled when complimented by SLP on his  progress so far. Recommend continue w/ frequent oral care for hygiene and stimulation of swallowing. Recommend Pleasure SINGLE ice chips w/ NSG staff post thorough oral care and positioning upright; aspiration precautions. NSG supervision for safe, oral intake of ice chips during the weekend. Education posted in room re: oral care and ice chips. It will be vital that skilled Dysphagia therapy and therapeutic po trials continue at discharge to SNF. MD/NSG updated. ST services will f/u next 1-2 days for ongoing assessment of oropharyngeal swallow function.    HPI HPI: Pt is a 74 y.o. male with medical history significant of Respiratory Arrest Jan. 1, 2021 requirig tracheostomy and PEG tube placement, (now decannulated); NPO status since Jan. 1, 2021; Covid 11/02/2019; CVA; prediabetes; PE around 2018 started on Xarelto; chronic tremors of bilateral hands; Bipolar Disorder; morbid Obesity; CKD; HLD; neuropathy; chronic atrial fibrillation; DM2; COPD; Parkinson disease. He has been in [facility] since Jan. 2021, and was discharged to SNF on 01/25/2020. Was febrile when arrived to SNF and sent here to this ED on 01/27/2020.  Per pt's Sister, pt has not received skilled ST services for Dysphagia.      SLP Plan  Continue with current plan of care       Recommendations  Diet recommendations: (ice chips for pleasure; TFs via PEG) Medication Administration: Via alternative means(PEG) Supervision: Full supervision/cueing for compensatory strategies;Staff to assist with self feeding Compensations: Minimize environmental distractions;Slow rate;Small sips/bites Postural Changes and/or Swallow Maneuvers: Seated upright 90 degrees;Upright 30-60 min after meal                General recommendations: (Dietician following) Oral Care Recommendations: Oral care QID;Staff/trained caregiver to provide oral care Follow up Recommendations: Skilled Nursing facility SLP Visit Diagnosis: Dysphagia, oropharyngeal phase  (R13.12);Cognitive  communication deficit (P12.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

## 2020-02-07 LAB — GLUCOSE, CAPILLARY
Glucose-Capillary: 108 mg/dL — ABNORMAL HIGH (ref 70–99)
Glucose-Capillary: 108 mg/dL — ABNORMAL HIGH (ref 70–99)
Glucose-Capillary: 113 mg/dL — ABNORMAL HIGH (ref 70–99)
Glucose-Capillary: 119 mg/dL — ABNORMAL HIGH (ref 70–99)
Glucose-Capillary: 121 mg/dL — ABNORMAL HIGH (ref 70–99)
Glucose-Capillary: 99 mg/dL (ref 70–99)

## 2020-02-07 NOTE — Consult Note (Signed)
WOC Nurse Consult Note: Reason for Consult: assistance with complicated NPWT dressing Wound type: surgical debridement of sacrum and left trochanter wounds per surgical team.  Pressure Injury POA: Yes Measurement:  Sacrum: 20cmx 13.5cm x 3cm x 3cm underminning  Left trochanter: 9cm x 6cm x 4cm tunneling at o'clock   Wound bed: Sacrum: 95% red/5% pink Left trochanter: 50% pink/50% yellow Drainage (amount, consistency, odor) minimal with serosanguinous in canister   Periwound: intact   Dressing procedure/placement/frequency: Removed old NPWT dressing  Skin protected to the hip and thigh with VAC drape for foam bridge  Filled wound with  _2 pc__ piece of black foam to the sacrum; 1pc of left trochanter; 1pc for each bridge  Sealed NPWT dressing at HG Patient received PO pain medication Patient tolerated procedure well  WOC nurse will continue to provide NPWT dressing changed due to the complexity of the dressing change.   Melroy Bougher [facility], CNS, [employer] [phone]

## 2020-02-07 NOTE — Progress Notes (Signed)
[facility] PROGRESS NOTE Date of Admission:  01/27/2020     ID: Kyle [name] is a 74 y.o. 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   ROS  Unable to obtain  Medications:  Antibiotics Given (last 72 hours)    Date/Time Action Medication Dose Rate   02/04/20 2040 New Bag/Given   DAPTOmycin (CUBICIN) 700 mg in sodium chloride 0.9 % IVPB 700 mg 228 mL/hr   02/04/20 2312 New Bag/Given   Ampicillin-Sulbactam (UNASYN) 3 g in sodium chloride 0.9 % 100 mL IVPB 3 g 200 mL/hr   02/05/20 0615 New Bag/Given   Ampicillin-Sulbactam (UNASYN) 3 g in sodium chloride 0.9 % 100 mL IVPB 3 g 200 mL/hr   02/05/20 1710 New Bag/Given   Ampicillin-Sulbactam (UNASYN) 3 g in sodium chloride 0.9 % 100 mL IVPB 3 g 200 mL/hr   02/05/20 2058 New Bag/Given   DAPTOmycin (CUBICIN) 700 mg in sodium chloride 0.9 % IVPB 700 mg 228 mL/hr   02/06/20 0004 New Bag/Given   Ampicillin-Sulbactam (UNASYN) 3 g in sodium chloride 0.9 % 100 mL IVPB 3 g 200 mL/hr   02/06/20 0457 New Bag/Given   Ampicillin-Sulbactam (UNASYN) 3 g in sodium chloride 0.9 % 100 mL IVPB 3 g 200 mL/hr   02/06/20 1217 New Bag/Given   Ampicillin-Sulbactam (UNASYN) 3 g in sodium chloride 0.9 % 100 mL IVPB 3 g 200 mL/hr   02/06/20 1723 New Bag/Given   Ampicillin-Sulbactam (UNASYN) 3 g in sodium chloride 0.9 % 100 mL IVPB 3 g 200 mL/hr   02/06/20 2042 New Bag/Given   DAPTOmycin (CUBICIN) 700 mg in sodium chloride 0.9 % IVPB 700 mg 228 mL/hr   02/07/20 0026 New Bag/Given   Ampicillin-Sulbactam (UNASYN) 3 g in sodium chloride 0.9 % 100 mL  IVPB 3 g 200 mL/hr   02/07/20 0510 New Bag/Given   Ampicillin-Sulbactam (UNASYN) 3 g in sodium chloride 0.9 % 100 mL IVPB 3 g 200 mL/hr   02/07/20 1118 New Bag/Given   Ampicillin-Sulbactam (UNASYN) 3 g in sodium chloride 0.9 % 100 mL IVPB 3 g 200 mL/hr   02/07/20 1810 New Bag/Given   Ampicillin-Sulbactam (UNASYN) 3 g in sodium chloride 0.9 % 100 mL IVPB 3 g 200 mL/hr     . amiodarone  100 mg Per Tube BID  . benztropine  0.5 mg Per Tube BID  . carbidopa-levodopa  1 tablet Per Tube BID  . diltiazem  30 mg Per Tube QID  . famotidine  20 mg Per Tube Daily  . feeding supplement (PRO-STAT SUGAR FREE 64)  30 mL Per Tube Daily  . free water  175 mL Per Tube Q3H  . hydrALAZINE  50 mg Per Tube BID  . insulin aspart  0-9 Units Subcutaneous Q4H  . ipratropium-albuterol  3 mL Nebulization TID  . metoprolol tartrate  12.5 mg Per Tube BID  . nutrition supplement (JUVEN)  1 packet Per Tube BID BM  . sodium chloride flush  10 mL Intravenous Q12H  . thiamine  200 mg Per Tube Daily    Objective: Vital signs in last 24 hours: Temp:  [97.9  F (36.6 C)-99 F (37.2 C)] 97.9 F (36.6 C) (03/17 1641) Pulse Rate:  [66-99] 79 (03/17 1641) Resp:  [17-20] 17 (03/17 1641) BP: (123-160)/(61-84) 142/66 (03/17 1641) SpO2:  [97 %-100 %] 98 % (03/17 2009) Constitutional: He is awake,but minimally interactive  HENT: anicetric  Mouth/Throat: Oropharynx is clear and dry. No oropharyngeal exudate.  Cardiovascular: Normal rate, regular rhythm and normal heart sounds. Pulmonary/Chest: Effort normal and breath sounds normal.   Abdominal: Soft. Bowel sounds are normal. He exhibits no distension. PEG Tube in place Lymphadenopathy: He has no cervical adenopathy.  Neurological: confused Skin: wound vac in place  Psychiatric: unable to assess  Lab Results Recent Labs    02/05/20 0440 02/06/20 0630  WBC 7.6 7.4  HGB 7.7* 8.6*  HCT 26.3* 28.1*  NA 145 144  K 3.9 4.1  CL 110 108  CO2 26 26  BUN 21 18   CREATININE 0.44* 0.48*    Microbiology: Results for orders placed or performed during the hospital encounter of 01/27/20  Blood Culture (routine x 2)     Status: None   Collection Time: 01/27/20 10:42 PM   Specimen: BLOOD  Result Value Ref Range Status   Specimen Description BLOOD BLOOD LEFT HAND  Final   Special Requests   Final    BOTTLES DRAWN AEROBIC AND ANAEROBIC Blood Culture adequate volume   Culture   Final    NO GROWTH 5 DAYS Performed at [facility], [address], [place], [place] [address]    Report Status 02/01/2020 FINAL  Final  Blood Culture (routine x 2)     Status: None   Collection Time: 01/27/20 10:42 PM   Specimen: BLOOD  Result Value Ref Range Status   Specimen Description BLOOD BLOOD LEFT HAND  Final   Special Requests   Final    BOTTLES DRAWN AEROBIC AND ANAEROBIC Blood Culture adequate volume   Culture   Final    NO GROWTH 5 DAYS Performed at [facility], [address] [place], [place] [address]    Report Status 02/01/2020 FINAL  Final  Respiratory Panel by RT PCR (Flu A&B, Covid) - Nasopharyngeal Swab     Status: None   Collection Time: 01/27/20 11:57 PM   Specimen: Nasopharyngeal Swab  Result Value Ref Range Status   SARS Coronavirus 2 by RT PCR NEGATIVE NEGATIVE Final    Comment: (NOTE) SARS-CoV-2 target nucleic acids are NOT DETECTED. The SARS-CoV-2 RNA is generally detectable in upper respiratoy specimens during the acute phase of infection. The lowest concentration of SARS-CoV-2 viral copies this assay can detect is 131 copies/mL. A negative result does not preclude SARS-Cov-2 infection and should not be used as the sole basis for treatment or other patient management decisions. A negative result may occur with  improper specimen collection/handling, submission of specimen other than nasopharyngeal swab, presence of viral mutation(s) within the areas targeted by this assay, and inadequate number of viral  copies (<131 copies/mL). A negative result must be combined with clinical observations, patient history, and epidemiological information. The expected result is Negative. Fact Sheet for Patients:  PinkCheek.be Fact Sheet for Healthcare Providers:  GravelBags.it This test is not yet ap proved or cleared by the [place] FDA and  has been authorized for detection and/or diagnosis of SARS-CoV-2 by FDA under an Emergency Use Authorization (EUA). This EUA will remain  in effect (meaning this test can be used) for the duration of the COVID-19 declaration under Section 564(b)(1) of the Act,  21 U.S.C. section 360bbb-3(b)(1), unless the authorization is terminated or revoked sooner.    Influenza A by PCR NEGATIVE NEGATIVE Final   Influenza B by PCR NEGATIVE NEGATIVE Final    Comment: (NOTE) The Xpert Xpress SARS-CoV-2/FLU/RSV assay is intended as an aid in  the diagnosis of influenza from Nasopharyngeal swab specimens and  should not be used as a sole basis for treatment. Nasal washings and  aspirates are unacceptable for Xpert Xpress SARS-CoV-2/FLU/RSV  testing. Fact Sheet for Patients: PinkCheek.be Fact Sheet for Healthcare Providers: GravelBags.it This test is not yet approved or cleared by the [place] FDA and  has been authorized for detection and/or diagnosis of SARS-CoV-2 by  FDA under an Emergency Use Authorization (EUA). This EUA will remain  in effect (meaning this test can be used) for the duration of the  Covid-19 declaration under Section 564(b)(1) of the Act, 21  U.S.C. section 360bbb-3(b)(1), unless the authorization is  terminated or revoked. Performed at [facility], [address] [place], [place] [address]   Aerobic/Anaerobic Culture (surgical/deep wound)     Status: None   Collection Time: 01/28/20  1:06 PM   Specimen: PATH Soft  tissue  Result Value Ref Range Status   Specimen Description   Final    TISSUE Performed at [facility], [address], [place], [place] [address]    Special Requests   Final    NONE Performed at [facility], [address] [place], [place] [address]    Gram Stain   Final    RARE WBC PRESENT,BOTH PMN AND MONONUCLEAR NO ORGANISMS SEEN Performed at [facility], [place] [address] [place], [place] [address]    Culture   Final    RARE ENTEROCOCCUS FAECIUM NO ANAEROBES ISOLATED VANCOMYCIN RESISTANT ENTEROCOCCUS ISOLATED    Report Status 02/03/2020 FINAL  Final   Organism ID, Bacteria ENTEROCOCCUS FAECIUM  Final      Susceptibility   Enterococcus faecium - MIC*    AMPICILLIN >=32 RESISTANT Resistant     VANCOMYCIN >=32 RESISTANT Resistant     GENTAMICIN SYNERGY SENSITIVE Sensitive     LINEZOLID 2 SENSITIVE Sensitive     * RARE ENTEROCOCCUS FAECIUM  Aerobic/Anaerobic Culture (surgical/deep wound)     Status: None   Collection Time: 01/28/20  1:54 PM   Specimen: Wound; Body Fluid  Result Value Ref Range Status   Specimen Description   Final    WOUND Performed at [facility], [address] [place], [place] [address]    Special Requests   Final    NONE Performed at [facility], [address] [place], [place] [address]    Gram Stain   Final    FEW WBC PRESENT, PREDOMINANTLY PMN NO ORGANISMS SEEN    Culture   Final    No growth aerobically or anaerobically. Performed at [facility], [place] [address] [place], [place] [address]    Report Status 02/02/2020 FINAL  Final  Aerobic/Anaerobic Culture (surgical/deep wound)     Status: None   Collection Time: 01/28/20  1:55 PM   Specimen: PATH Soft tissue  Result Value Ref Range Status   Specimen Description   Final    TISSUE Performed at [facility], [address] [place], [place] [address]    Special Requests   Final    NONE Performed at [facility], [address] [place], [place] [address]    Gram Stain   Final  RARE WBC PRESENT, PREDOMINANTLY PMN FEW GRAM POSITIVE COCCI IN PAIRS FEW GRAM VARIABLE ROD    Culture   Final    ABUNDANT ENTEROCOCCUS FAECIUM FEW CANDIDA ALBICANS FEW BACTEROIDES THETAIOTAOMICRON BETA LACTAMASE POSITIVE Performed at [facility], [place] [address], [place], [place] [address]    Report Status 02/01/2020 FINAL  Final   Organism ID, Bacteria ENTEROCOCCUS FAECIUM  Final      Susceptibility   Enterococcus faecium - MIC*    AMPICILLIN >=32 RESISTANT Resistant     VANCOMYCIN >=32 RESISTANT Resistant     GENTAMICIN SYNERGY SENSITIVE Sensitive     LINEZOLID 2 SENSITIVE Sensitive     * ABUNDANT ENTEROCOCCUS FAECIUM  Aerobic/Anaerobic Culture (surgical/deep wound)     Status: None   Collection Time: 01/28/20  2:17 PM   Specimen: PATH Bone biopsy; Tissue  Result Value Ref Range Status   Specimen Description   Final    BIOPSY Performed at [facility], [address] [place], [place] [address]    Special Requests   Final    NONE Performed at [facility], [place], [place] [address]    Gram Stain NO WBC SEEN NO ORGANISMS SEEN   Final   Culture   Final    RARE VANCOMYCIN RESISTANT ENTEROCOCCUS ISOLATED NO ANAEROBES ISOLATED Performed at [facility], [place] [address] [place], [place] [address]    Report Status 02/02/2020 FINAL  Final   Organism ID, Bacteria VANCOMYCIN RESISTANT ENTEROCOCCUS ISOLATED  Final      Susceptibility   Vancomycin resistant enterococcus isolated - MIC*    AMPICILLIN >=32 RESISTANT Resistant     VANCOMYCIN >=32 RESISTANT Resistant     GENTAMICIN SYNERGY SENSITIVE Sensitive     LINEZOLID 2 SENSITIVE Sensitive     * RARE VANCOMYCIN RESISTANT ENTEROCOCCUS ISOLATED  MRSA PCR Screening     Status: None   Collection Time: 01/31/20  8:51 PM   Specimen: Nasal Mucosa; Nasopharyngeal  Result Value Ref Range Status   MRSA by PCR  NEGATIVE NEGATIVE Final    Comment:        The GeneXpert MRSA Assay (FDA approved for NASAL specimens only), is one component of a comprehensive MRSA colonization surveillance program. It is not intended to diagnose MRSA infection nor to guide or monitor treatment for MRSA infections. Performed at [facility], [address] [place], [place] [address]   Aerobic/Anaerobic Culture (surgical/deep wound)     Status: None (Preliminary result)   Collection Time: 02/05/20 11:48 AM   Specimen: PATH Other; Tissue  Result Value Ref Range Status   Specimen Description TISSUE SACRAL  Final   Special Requests NONE  Final   Gram Stain   Final    RARE WBC PRESENT, PREDOMINANTLY PMN FEW GRAM POSITIVE COCCI IN PAIRS Performed at [facility], [place] [address], [place],  [address]    Culture   Final    RARE ENTEROCOCCUS FAECIUM SUSCEPTIBILITIES TO FOLLOW NO ANAEROBES ISOLATED; CULTURE IN PROGRESS FOR 5 DAYS    Report Status PENDING  Incomplete  Aerobic/Anaerobic Culture (surgical/deep wound)     Status: None (Preliminary result)   Collection Time: 02/05/20 11:48 AM   Specimen: PATH Other; Tissue  Result Value Ref Range Status   Specimen Description TISSUE  Final   Special Requests LEFT TROCHANTERIC  Final   Gram Stain   Final    RARE WBC PRESENT,BOTH PMN AND MONONUCLEAR FEW GRAM POSITIVE COCCI IN PAIRS  Culture   Final    FEW ENTEROCOCCUS FAECIUM SUSCEPTIBILITIES TO FOLLOW HOLDING FOR POSSIBLE ANAEROBE Performed at [facility], [place] [address] [place], [place] [address]    Report Status PENDING  Incomplete    Studies/Results: No results found.  Assessment/Plan: Kyle [name] is a 74 y.o. male admitted March 6 with a fever from the nursing home.  He has a very complicated medical history.  He suffered Covid pneumonia in December and had a prolonged hospitalization including tracheostomy and PEG tube placement.  He was at a long-term acute care  facility and recently discharged to a skilled nursing facility.  He was sent to the hospital due to fever and found to have a large infected appearing decubitus ulcer.  CT scan showed the sacral decub which extended to the bone with a fluid collection suspicious for an abscess.  On admission temperature was 101.8 white count was 11.3.  He was taken to the operating room March 7 by Dr. Hampton Abbot.  Extensive debridement was done of the sacral wound as well as the infected left hip and leg ulcer and the left ischial wound.  There was a sacral bone biopsy was done.  Wound VAC was placed.He is unable to provide me a history.  He has extensive wounds down to bone. These are unlikely to heal unless his mobility status improves and nutritional status improves (alb 2.0) which seems unlikely at point. ESR 126, crp 9.6.   3/9 - s/p repeat surgery and site remains clean 3/10 no fevers, noted to have foreign body in pharnyx and ENT did scope for removal Cx with VRE, candida, possible anaerobe I would strongly suggest palliative care for goals of care.  3/17 dispo planning in place. dapto and linezolid cost prohibitive but there are no other options for VRE.  Rec Cont Dapto and unasyn. No other alternatives to daptomycin Fluconazole per GT for a week for the yeast..  Thank you very much for the consult. Will follow with you.  Leonel Ramsay   02/07/2020, 8:24 PM

## 2020-02-07 NOTE — Progress Notes (Signed)
[facility] SURGICAL PROGRESS NOTE  Hospital Day(s): 10.   Post op day(s): 2 Days Post-Op.   Interval History:  Patient seen and examined no acute events or new complaints overnight.  Patient does not provide much history secondary to dementia, he does deny any pain No recorded fevers No new labs Continues on IV Unasyn and Daptomycin   Vital signs in last 24 hours: [min-max] current  Temp:  [98.4 F (36.9 C)-99 F (37.2 C)] 99 F (37.2 C) (03/17 0754) Pulse Rate:  [68-99] 99 (03/17 0810) Resp:  [18-20] 20 (03/17 0810) BP: (123-160)/(61-69) 123/61 (03/17 0754) SpO2:  [97 %-100 %] 98 % (03/17 0810)     Height: 5\' 9"  (175.3 cm) Weight: 86.2 kg BMI (Calculated): 28.05   Intake/Output last 2 shifts:  03/16 0701 - 03/17 0700 In: 264 [IV Piggyback:114] Out: 350 [Urine:300; Drains:50]   Physical Exam:  Constitutional: alert, cooperative and no distress  Respiratory: breathing non-labored at rest  Cardiovascular: regular rate and sinus rhythm  Integumentary:Large stage IV sacral decubitus ulcer, healthy tissue in wound bed, no appreciable necrotic tissue or purulence. Stage IV left ischial ulceration, healthy tissue in wound bed, bone palpable but not visible, there is undermining of this superiorly. Wound vac was changed at bedside  Sacral Wound (02/07/2020):     Left Ischial Wound (02/07/2020):       Labs:  CBC Latest Ref Rng & Units 02/06/2020 02/05/2020 02/04/2020  WBC 4.0 - 10.5 K/uL 7.4 7.6 7.6  Hemoglobin 13.0 - 17.0 g/dL 02/06/2020) 7.7(L) 8.3(L)  Hematocrit 39.0 - 52.0 % 28.1(L) 26.3(L) 28.3(L)  Platelets 150 - 400 K/uL 323 356 346   CMP Latest Ref Rng & Units 02/06/2020 02/05/2020 02/04/2020  Glucose 70 - 99 mg/dL 02/06/2020) 80 350(K)  BUN 8 - 23 mg/dL 18 21 19   Creatinine 0.61 - 1.24 mg/dL 938(H) ) 8.29(H)  Sodium 135 - 145 mmol/L 144 145 151(H)  Potassium 3.5 - 5.1 mmol/L 4.1 3.9 4.2  Chloride 98 - 111 mmol/L 108 110 114(H)  CO2 22 - 32 mmol/L  26 26 29   Calcium 8.9 - 10.3 mg/dL 9.5 9.6 3.71(I  Total Protein 6.5 - 8.1 g/dL - - -  Total Bilirubin 0.3 - 1.2 mg/dL - - -  Alkaline Phos 38 - 126 U/L - - -  AST 15 - 41 U/L - - -  ALT 0 - 44 U/L - - -     Imaging studies: No new pertinent imaging studies   Assessment/Plan:  74 y.o. male 2 Days Post-Op s/p fourth debridement of sacral and left ischial pressure ulcers and placement of wound VAC greater than 50 cm   - Wound vac changed at bedside with assistance of WOC RN; continue changes MWF   - No further surgical intervention  - Adjust ABx per culture results; vancomycin resistant enterococcus faecium; now on Unasyn and Daptomycin - Continue IVF resuscitation - agree palliative care consult would be reasonable  - Continue frequent positioning changes, offloading, low air loss mattress  - further management per primary team   All of the above findings and recommendations were discussed with the medical team  -- , PA-C  [facility] 02/07/2020, 10:50 AM [phone] M-F: 7am - 4pm

## 2020-02-07 NOTE — Progress Notes (Signed)
PROGRESS NOTE    Kyle [name]  MVH:[id number] DOB: 1946-01-11 DOA: 01/27/2020  PCP: [facility]    LOS - 10   Brief Narrative:  Kyle [name] a 74 y.o.malewith medical history significant of COVID12/08/2019. CVA, prediabetes, PE around 2018 started on Xarelto, chronic tremors of bilateral hands, bipolar disorder, morbid obesity,CKD, HLD, neuropathy, chronic atrial fibrillation, DM2,COPD, and Parkinson disease.He presented withfever from SNF. He had been discharged from SNF after a 10 day inpatient stay for acute hypoxemic respiratory failure due to COVID-19. During this stay the patient was treated with Remdesivir, decadron, Rocephin and azithromycin.  In December patient was admitted for Covid with acute respiratory hypoxemic failure treated remdesivir 10 days of Decadron and Rocephin and azithromycin for possible bacterial superinfection in [place]. This was followed by a roughly three week stay at [facility]. This was followed by a three day stay at [facility].   His hospital stay in [place] was complicated by tachycardia patient was seen by cardiology started Cardizem metoprolol.The patient suffered respiratory arrest on 24 November 2019 and had to be intubated.He had an EEG showing slowing consistent with mild residual encephalopathy patient required tracheostomy and PEG tube placement. He later on developed pressure sacral decubitus ulcer which required debridement.He required broad-spectrum antibiotics IV vancomycin and Zosyn. He thentransferred to [name] in [place].His antibiotics were later simplified to cefepime. His trach was de cannulated.Patient remains nonverbal.In February he developed pneumonia worrisome for aspiration and was treated Unasyn.Hefailed trial of decannulation plan for him to have a ENT consult done to place an hour trach he had copious secretion requiring frequent suctioning as well as nasotracheal  suctioning.Patient was finally yesterday discharged from [facility] to SNF3/02/2020, but continued to be febrile.As soon as he presented to facility and was transferred to [facility] emergency department today.  The patient was admitted to a telemetry bed. General surgery and Infectious disease were consulted, and the patient went for operative I & D on 01/28/2020. Cultures were taken. He is receiving IV  The patient has been admitted to a telemetry bed. General surgery has been consulted and has taken the patient for operative I&D of the sacral wound on 01/28/2020. Cultures were taken. Infectious disease has also been consulted.The patient is receiving IV Zosyn and Vancomycin. Nutrition has been consulted and the patient has been restarted on tube feeds. SLP has been consulted for swallow eval, although this has not yet taken place as the patient has been in procedures. Cultures have grown enterococcus and possible anaerobes. Infectious disease feels that it will be unlikely that these wounds will heal given the patient's poor nutritional and mobility status. He has recommended palliative care consult.Returned to the OR again3/9, 3/11and 3/15 for further debridement.  Subjective 3/15: Pt denied pain, but otherwise, difficult to understand his answers to ROS questions. No noted fever, N/V/D.   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 Lactic acid was 1.7 and procalcitonin was 0.28. Initially this was felt to be due to pneumonia. However the patient had  received treatment with Unasyn for pneumonia in February. CXR demonstrated persistent radiological  evidence of pneumonia, but clinically the patient has no adventitious lung sounds, no cough, and he is saturating 98% on room air. However, CT does show evidence of Stage IV decubitus sacral ulcer with abscess and possible bone involvement. He has gone for multiple surgical debridements with general surgerythis admission.   Stage IV sacral decubitus ulcerwith abscess and possible bony involvement.  S/P I & D with general surgeryx 3. The patient has a history of shrapnel, sowould not be a good candidate for MRI.CRP elevated at 9.7. ESR is 126.Albumin 2.1. Culture has grown outVanc resistantE. Faecium and possible anaerobe. --ID consulted, appreciate recommendations --ContinueonIV Unasyn and Daptomycin  --unlikely sacral wound will heal with poor nutritional and functional status, palliative consulted --general surgery following --wound vac change MWF --palliative care following, patient will be followed by outpatient palliative at SNF after discharge --patient to be discharged with wound VAC --No further surgical procedure, per surgery --wound care, dressing changes, off loading, air mattress, and Wound Clinic follow up.  ?Recurrent aspiration pneumonia versus HCAP:resolved ENT was able to remove a large piece of retained material from vallecula on 3/11. --Speech evaluations, ongoing --continue tube feeds by PEG tube  Dysphagia:mgmt as above  Hypernatremia: Resolved.  Napeaked to 153. Getting free waterflushes by PEG tube. --Free water flushes: 175 cc every 3 hours --Adjust free water as needed to maintain normal sodium level --recheck BMP in AM  Hypertension: Chronic. Continue home medications.  Severe protein calorie malnutrition: Nutrition consult appreciated. This severely impacts the patient's ability to heal these wounds.  Anemia: Likely due to chronic  disease.  Stable and no evidence of bleeding. --Monitor CBC  Chronic atrial fibrillation(HCC)-The patient has a CHADS2VASC score of >3.  Rate controlled. --Resume eliquis when cleared by general surgery and no more OR trips planned.  --Continue metoprolol and amiodarone   Type 2 Diabetes: Hb A1c is 4.2. Glucoses to be followed by FSBS and sensitive SSI.  --Oral antihyperglycemics have been held.  --carbohydrate controlled diet/tube feeds and hypoglycemic protocol.  Parkinson disease: Noted. Likely playing a part in patient's history of aspiration. --ContinueCogentin, Sinemet  Encephalopathy:Status post hypoxic respiratory arrest requiring intubation in the setting of Covid in December 2020.Patient with some minimal improvements per family started to respond to his name at times but mainly remains nonverbal bedbound incontinent status post PEG tube. Lurline Idol has been decannulated perLTAC notes if patient has significant secretions may need to replace tracheostomy. At this point appears to be stable.    DVT prophylaxis:SCD's Code Status: DNR Family Communication:none at bedside during encounter  Disposition Plan:To SNF pending bed offer.  Barrier currently is pt's daptomycin is too expensive (Linezolid on the expensive list as well), so SNF will not accept.   Consultants:  General Surgery  Infectious Disease  Palliative Care  Procedures:  Surgical debridement of sacral decubitus ulcer x 2  Antimicrobials:  Unasyn, Daptomycin  Objective: Vitals:   02/07/20 0356 02/07/20 0754 02/07/20 0810 02/07/20 1202  BP: 131/69 123/61  (!) 144/84  Pulse: 75 77 99 66  Resp:  _0 Temp: 98.8 F (37.1 C) 99 F (37.2 C)  98.8 F (37.1 C)  TempSrc: Oral Oral  Oral  SpO2: 97% 100% 98% 98%  Weight:      Height:        Intake/Output Summary (Last 24 hours) at 02/07/2020 1612 Last data filed at 02/07/2020 0640 Gross per 24 hour  Intake 264 ml    Output 350 ml  Net -86 ml  Filed Weights   01/27/20 2220  Weight: 86.2 kg    Examination:  Constitutional: NAD, lethargic, arousable,  HEENT: conjunctivae and lids normal, EOMI, mucosa dry CV: RRR no M,R,G. Distal pulses +2.  No cyanosis.   RESP: CTA B/L over anterior, normal respiratory effort  GI: +BS, NTND, tube feed present Extremities: No effusions, edema, or tenderness in BLE SKIN: warm. Neuro: II - XII grossly intact.  Wound vac draining good amount of serosanguinous fluids   Data Reviewed: I have personally reviewed following labs and imaging studies  CBC: Recent Labs  Lab 02/02/20 0831 02/03/20 0556 02/04/20 0640 02/05/20 0440 02/06/20 0630  WBC 7.4 7.0 7.6 7.6 7.4  HGB 8.2* 8.4* 8.3* 7.7* 8.6*  HCT 28.4* 29.3* 28.3* 26.3* 28.1*  MCV 114.5* 114.9* 111.9* 110.5* 108.5*  PLT 389 388 346 356 235   Basic Metabolic Panel: Recent Labs  Lab 02/02/20 0831 02/03/20 0556 02/04/20 0640 02/05/20 0440 02/06/20 0630  NA 153* 152* 151* 145 144  K 4.0 3.8 4.2 3.9 4.1  CL 118* 116* 114* 110 108  CO2 _0 GLUCOSE 125* 128* 117* 80 123*  BUN _1 CREATININE 0.38* 0.42* 0.45* 0.44* 0.48*  CALCIUM 10.7* 10.3 10.2 9.6 9.5  MG 3.0* 2.8* 2.9* 2.6* 2.5*   GFR: Estimated Creatinine Clearance: 90.8 mL/min (A) (by C-G formula based on SCr of 0.48 mg/dL (L)). Liver Function Tests: No results for input(s): AST, ALT, ALKPHOS, BILITOT, PROT, ALBUMIN in the last 168 hours. No results for input(s): LIPASE, AMYLASE in the last 168 hours. No results for input(s): AMMONIA in the last 168 hours. Coagulation Profile: No results for input(s): INR, PROTIME in the last 168 hours. Cardiac Enzymes: Recent Labs  Lab 02/01/20 0458  CKTOTAL 27*   BNP (last 3 results) No results for input(s): PROBNP in the last 8760 hours. HbA1C: No results for input(s): HGBA1C in the last 72 hours. CBG: Recent Labs  Lab 02/06/20 2103 02/07/20 0009 02/07/20 0352  02/07/20 0754 02/07/20 1201  GLUCAP 113* 113* 121* 108* 108*   Lipid Profile: No results for input(s): CHOL, HDL, LDLCALC, TRIG, CHOLHDL, LDLDIRECT in the last 72 hours. Thyroid Function Tests: No results for input(s): TSH, T4TOTAL, FREET4, T3FREE, THYROIDAB in the last 72 hours. Anemia Panel: No results for input(s): VITAMINB12, FOLATE, FERRITIN, TIBC, IRON, RETICCTPCT in the last 72 hours. Sepsis Labs: No results for input(s): PROCALCITON, LATICACIDVEN in the last 168 hours.  Recent Results (from the past 240 hour(s))  MRSA PCR Screening     Status: None   Collection Time: 01/31/20  8:51 PM   Specimen: Nasal Mucosa; Nasopharyngeal  Result Value Ref Range Status   MRSA by PCR NEGATIVE NEGATIVE Final    Comment:        The GeneXpert MRSA Assay (FDA approved for NASAL specimens only), is one component of a comprehensive MRSA colonization surveillance program. It is not intended to diagnose MRSA infection nor to guide or monitor treatment for MRSA infections. Performed at [facility], [address] [place], [place] [address]   Aerobic/Anaerobic Culture (surgical/deep wound)     Status: None (Preliminary result)   Collection Time: 02/05/20 11:48 AM   Specimen: PATH Other; Tissue  Result Value Ref Range Status   Specimen Description TISSUE SACRAL  Final   Special Requests NONE  Final   Gram Stain   Final    RARE WBC PRESENT, PREDOMINANTLY PMN FEW GRAM POSITIVE COCCI IN PAIRS Performed  at [facility], [place] [address] [place], [place] [address]    Culture   Final    RARE ENTEROCOCCUS FAECIUM SUSCEPTIBILITIES TO FOLLOW NO ANAEROBES ISOLATED; CULTURE IN PROGRESS FOR 5 DAYS    Report Status PENDING  Incomplete  Aerobic/Anaerobic Culture (surgical/deep wound)     Status: None (Preliminary result)   Collection Time: 02/05/20 11:48 AM   Specimen: PATH Other; Tissue  Result Value Ref Range Status   Specimen Description TISSUE  Final   Special Requests LEFT  TROCHANTERIC  Final   Gram Stain   Final    RARE WBC PRESENT,BOTH PMN AND MONONUCLEAR FEW GRAM POSITIVE COCCI IN PAIRS    Culture   Final    FEW ENTEROCOCCUS FAECIUM SUSCEPTIBILITIES TO FOLLOW HOLDING FOR POSSIBLE ANAEROBE Performed at [facility], [place] [address], [place], [place] [address]    Report Status PENDING  Incomplete         Radiology Studies: No results found.      Scheduled Meds: . amiodarone  100 mg Per Tube BID  . benztropine  0.5 mg Per Tube BID  . carbidopa-levodopa  1 tablet Per Tube BID  . diltiazem  30 mg Per Tube QID  . famotidine  20 mg Per Tube Daily  . feeding supplement (PRO-STAT SUGAR FREE 64)  30 mL Per Tube Daily  . free water  175 mL Per Tube Q3H  . hydrALAZINE  50 mg Per Tube BID  . insulin aspart  0-9 Units Subcutaneous Q4H  . ipratropium-albuterol  3 mL Nebulization TID  . metoprolol tartrate  12.5 mg Per Tube BID  . nutrition supplement (JUVEN)  1 packet Per Tube BID BM  . sodium chloride flush  10 mL Intravenous Q12H  . thiamine  200 mg Per Tube Daily   Continuous Infusions: . sodium chloride 250 mL (02/04/20 2030)  . ampicillin-sulbactam (UNASYN) IV 3 g (02/07/20 1118)  . DAPTOmycin (CUBICIN)  IV Stopped (02/06/20 2113)  . feeding supplement (JEVITY 1.5 CAL/FIBER) 1,000 mL (02/05/20 1710)     LOS: 10 days    Enzo Bi, MD [facility]   If 7PM-7AM, please contact night-coverage [email or web address] 02/07/2020, 4:12 PM

## 2020-02-08 LAB — BASIC METABOLIC PANEL
Anion gap: 11 (ref 5–15)
BUN: 13 mg/dL (ref 8–23)
CO2: 24 mmol/L (ref 22–32)
Calcium: 9.4 mg/dL (ref 8.9–10.3)
Chloride: 105 mmol/L (ref 98–111)
Creatinine, Ser: 0.51 mg/dL — ABNORMAL LOW (ref 0.61–1.24)
GFR calc Af Amer: >60 mL/min (ref >60)
GFR calc non Af Amer: >60 mL/min (ref >60)
Glucose, Bld: 122 mg/dL — ABNORMAL HIGH (ref 70–99)
Potassium: 4.1 mmol/L (ref 3.5–5.1)
Sodium: 140 mmol/L (ref 135–145)

## 2020-02-08 LAB — GLUCOSE, CAPILLARY
Glucose-Capillary: 105 mg/dL — ABNORMAL HIGH (ref 70–99)
Glucose-Capillary: 112 mg/dL — ABNORMAL HIGH (ref 70–99)
Glucose-Capillary: 116 mg/dL — ABNORMAL HIGH (ref 70–99)
Glucose-Capillary: 119 mg/dL — ABNORMAL HIGH (ref 70–99)
Glucose-Capillary: 141 mg/dL — ABNORMAL HIGH (ref 70–99)
Glucose-Capillary: 89 mg/dL (ref 70–99)

## 2020-02-08 LAB — CBC
HCT: 25.1 % — ABNORMAL LOW (ref 39.0–52.0)
Hemoglobin: 7.9 g/dL — ABNORMAL LOW (ref 13.0–17.0)
MCH: 32.8 pg (ref 26.0–34.0)
MCHC: 31.5 g/dL (ref 30.0–36.0)
MCV: 104.1 fL — ABNORMAL HIGH (ref 80.0–100.0)
Platelets: 287 10*3/uL (ref 150–400)
RBC: 2.41 MIL/uL — ABNORMAL LOW (ref 4.22–5.81)
RDW: 15.1 % (ref 11.5–15.5)
WBC: 7.7 10*3/uL (ref 4.0–10.5)
nRBC: 0 % (ref 0.0–0.2)

## 2020-02-08 LAB — MAGNESIUM: Magnesium: 2.6 mg/dL — ABNORMAL HIGH (ref 1.7–2.4)

## 2020-02-08 LAB — CK: Total CK: 60 U/L (ref 49–397)

## 2020-02-08 MED ORDER — IPRATROPIUM-ALBUTEROL 0.5-2.5 (3) MG/3ML IN SOLN: 3.0000 mL | RESPIRATORY_TRACT | Status: DC | PRN

## 2020-02-08 MED ORDER — FLUCONAZOLE 40 MG/ML PO SUSR
400.0000 mg | Freq: Every day | ORAL | Status: DC
  Administered 2020-02-09 – 2020-02-13 (×5): 400 mg via ORAL
  Filled 2020-02-08 (×8): qty 10

## 2020-02-08 NOTE — Progress Notes (Signed)
PROGRESS NOTE    Kyle [name]  XIP:[id number] DOB: Dec 25, 1945 DOA: 01/27/2020  PCP: [facility]    LOS - 11   Brief Narrative:  Kyle [name] a 74 y.o.malewith medical history significant of COVID12/08/2019. CVA, prediabetes, PE around 2018 started on Xarelto, chronic tremors of bilateral hands, bipolar disorder, morbid obesity,CKD, HLD, neuropathy, chronic atrial fibrillation, DM2,COPD, and Parkinson disease.He presented withfever from SNF. He had been discharged from SNF after a 10 day inpatient stay for acute hypoxemic respiratory failure due to COVID-19. During this stay the patient was treated with Remdesivir, decadron, Rocephin and azithromycin.  In December patient was admitted for Covid with acute respiratory hypoxemic failure treated remdesivir 10 days of Decadron and Rocephin and azithromycin for possible bacterial superinfection in [place]. This was followed by a roughly three week stay at [facility]. This was followed by a three day stay at [facility].   His hospital stay in [place] was complicated by tachycardia patient was seen by cardiology started Cardizem metoprolol.The patient suffered respiratory arrest on 24 November 2019 and had to be intubated.He had an EEG showing slowing consistent with mild residual encephalopathy patient required tracheostomy and PEG tube placement. He later on developed pressure sacral decubitus ulcer which required debridement.He required broad-spectrum antibiotics IV vancomycin and Zosyn. He thentransferred to [name] in [place].His antibiotics were later simplified to cefepime. His trach was de cannulated.Patient remains nonverbal.In February he developed pneumonia worrisome for aspiration and was treated Unasyn.Hefailed trial of decannulation plan for him to have a ENT consult done to place an hour trach he had copious secretion requiring frequent suctioning as well as nasotracheal  suctioning.Patient was finally yesterday discharged from [place] to SNF3/02/2020, but continued to be febrile.As soon as he presented to facility and was transferred to [facility] emergency department today.  The patient was admitted to a telemetry bed. General surgery and Infectious disease were consulted, and the patient went for operative I & D on 01/28/2020. Cultures were taken. He is receiving IV  The patient has been admitted to a telemetry bed. General surgery has been consulted and has taken the patient for operative I&D of the sacral wound on 01/28/2020. Cultures were taken. Infectious disease has also been consulted.The patient is receiving IV Zosyn and Vancomycin. Nutrition has been consulted and the patient has been restarted on tube feeds. SLP has been consulted for swallow eval, although this has not yet taken place as the patient has been in procedures. Cultures have grown enterococcus and possible anaerobes. Infectious disease feels that it will be unlikely that these wounds will heal given the patient's poor nutritional and mobility status. He has recommended palliative care consult.Returned to the OR again3/9, 3/11and 3/15 for further debridement.  Subjective 3/15: Pt had temp to 100.7 overnight.    Pt said he was "doing ok" and didn't want anything to eat right now.  No N/V/D noted.     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 Lactic acid was 1.7 and procalcitonin was 0.28. Initially this was felt to be due to pneumonia. However  the patient had  received treatment with Unasyn for pneumonia in February. CXR demonstrated persistent radiological evidence of pneumonia, but clinically the patient has no adventitious lung sounds, no cough, and he is saturating 98% on room air. However, CT does show evidence of Stage IV decubitus sacral ulcer with abscess and possible bone involvement. He has gone for multiple surgical debridements with general surgerythis admission.   Stage IV sacral decubitus ulcerwith abscess and possible bony involvement.  S/P I & D with general surgeryx 3. The patient has a history of shrapnel, sowould not be a good candidate for MRI.CRP elevated at 9.7. ESR is 126.Albumin 2.1. Culture has grown outVanc resistantE. Faecium and possible anaerobe. --ID consulted, appreciate recommendations --unlikely sacral wound will heal with poor nutritional and functional status, palliative consulted --general surgery following PLAN: --ContinueonIV Unasyn and Daptomycin  --fluconazole 400 mg via G-tube, per ID --wound vac change MWF --palliative care following, patient will be followed by outpatient palliative at SNF after discharge --patient to be discharged with wound VAC --No further surgical procedure, per surgery --wound care, dressing changes, off loading, air mattress, and Wound Clinic follow up.  ?Recurrent aspiration pneumonia versus HCAP:resolved ENT was able to remove a large piece of retained material from vallecula on 3/11. --Speech evaluations, ongoing --continue tube feeds by PEG tube  Dysphagia:mgmt as above  Hypernatremia: Resolved.  Napeaked to 153. Getting free waterflushes by PEG tube. --Free water flushes: 175 cc every 3 hours --Adjust free water as needed to maintain normal sodium level --recheck BMP in AM  Hypertension: Chronic. Continue home medications.  Severe protein calorie malnutrition: Nutrition consult appreciated. This severely impacts the patient's  ability to heal these wounds.  Anemia: Likely due to chronic disease.  Stable and no evidence of bleeding. --Monitor CBC  Chronic atrial fibrillation(HCC)-The patient has a CHADS2VASC score of >3.  Rate controlled. --Resume eliquis when cleared by general surgery and no more OR trips planned.  --Continue metoprolol and amiodarone   Type 2 Diabetes: Hb A1c is 4.2. Glucoses to be followed by FSBS and sensitive SSI.  --Oral antihyperglycemics have been held.  --carbohydrate controlled diet/tube feeds and hypoglycemic protocol.  Parkinson disease: Noted. Likely playing a part in patient's history of aspiration. --ContinueCogentin, Sinemet  Encephalopathy:Status post hypoxic respiratory arrest requiring intubation in the setting of Covid in December 2020.Patient with some minimal improvements per family started to respond to his name at times but mainly remains nonverbal bedbound incontinent status post PEG tube. Lurline Idol has been decannulated perLTAC notes if patient has significant secretions may need to replace tracheostomy. At this point appears to be stable.   DVT prophylaxis:SCD's Code Status: DNR Family Communication:none at bedside during encounter  Disposition Plan:To SNF pending bed offer.  Barrier currently is pt's daptomycin is too expensive (Linezolid on the expensive list as well), so SNF will not accept.  Due to Vanc resistantE. Faecium, no other abx option available.     Consultants:  General Surgery  Infectious Disease  Palliative Care  Procedures:  Surgical debridement of sacral decubitus ulcer x 2  Antimicrobials:  Unasyn, Daptomycin  Objective: Vitals:   02/08/20 1207 02/08/20 1300 02/08/20 1523 02/08/20 1535  BP: (!) 151/76  (!) 158/74   Pulse: 79  92   Resp: (!) 30 (!) 24 (!) 34 (!) 24  Temp: 99.6 F (37.6 C)  100.1 F (37.8 C)   TempSrc: Axillary  Axillary   SpO2: 95%  97% 97%  Weight:      Height:  Intake/Output Summary (Last 24 hours) at 02/08/2020 1902 Last data filed at 02/08/2020 1400 Gross per 24 hour  Intake 784 ml  Output 1002 ml  Net -218 ml   Filed Weights   01/27/20 2220  Weight: 86.2 kg    Examination:  Constitutional: NAD, lethargic, arousable,  HEENT: conjunctivae and lids normal, EOMI, mucosa dry CV: RRR no M,R,G. Distal pulses +2.  No cyanosis.   RESP: CTA B/L over anterior, normal respiratory effort  GI: +BS, NTND, tube feed present Extremities: No effusions, edema, or tenderness in BLE SKIN: warm. Neuro: II - XII grossly intact.  Wound vac draining serosanguinous fluid   Data Reviewed: I have personally reviewed following labs and imaging studies  CBC: Recent Labs  Lab 02/03/20 0556 02/04/20 0640 02/05/20 0440 02/06/20 0630 02/08/20 0808  WBC 7.0 7.6 7.6 7.4 7.7  HGB 8.4* 8.3* 7.7* 8.6* 7.9*  HCT 29.3* 28.3* 26.3* 28.1* 25.1*  MCV 114.9* 111.9* 110.5* 108.5* 104.1*  PLT 388 346 356 323 010   Basic Metabolic Panel: Recent Labs  Lab 02/03/20 0556 02/04/20 0640 02/05/20 0440 02/06/20 0630 02/08/20 0808  NA 152* 151* 145 144 140  K 3.8 4.2 3.9 4.1 4.1  CL 116* 114* 110 108 105  CO2 '28 29 26 26 24  '$ GLUCOSE 128* 117* 80 123* 122*  BUN '17 19 21 18 13  '$ CREATININE 0.42* 0.45* 0.44* 0.48* 0.51*  CALCIUM 10.3 10.2 9.6 9.5 9.4  MG 2.8* 2.9* 2.6* 2.5* 2.6*   GFR: Estimated Creatinine Clearance: 90.8 mL/min (A) (by C-G formula based on SCr of 0.51 mg/dL (L)). Liver Function Tests: No results for input(s): AST, ALT, ALKPHOS, BILITOT, PROT, ALBUMIN in the last 168 hours. No results for input(s): LIPASE, AMYLASE in the last 168 hours. No results for input(s): AMMONIA in the last 168 hours. Coagulation Profile: No results for input(s): INR, PROTIME in the last 168 hours. Cardiac Enzymes: Recent Labs  Lab 02/08/20 0808  CKTOTAL 60   BNP (last 3 results) No results for input(s): PROBNP in the last 8760 hours. HbA1C: No results for  input(s): HGBA1C in the last 72 hours. CBG: Recent Labs  Lab 02/08/20 0006 02/08/20 0436 02/08/20 0745 02/08/20 1205 02/08/20 1645  GLUCAP 89 116* 119* 105* 112*   Lipid Profile: No results for input(s): CHOL, HDL, LDLCALC, TRIG, CHOLHDL, LDLDIRECT in the last 72 hours. Thyroid Function Tests: No results for input(s): TSH, T4TOTAL, FREET4, T3FREE, THYROIDAB in the last 72 hours. Anemia Panel: No results for input(s): VITAMINB12, FOLATE, FERRITIN, TIBC, IRON, RETICCTPCT in the last 72 hours. Sepsis Labs: No results for input(s): PROCALCITON, LATICACIDVEN in the last 168 hours.  Recent Results (from the past 240 hour(s))  MRSA PCR Screening     Status: None   Collection Time: 01/31/20  8:51 PM   Specimen: Nasal Mucosa; Nasopharyngeal  Result Value Ref Range Status   MRSA by PCR NEGATIVE NEGATIVE Final    Comment:        The GeneXpert MRSA Assay (FDA approved for NASAL specimens only), is one component of a comprehensive MRSA colonization surveillance program. It is not intended to diagnose MRSA infection nor to guide or monitor treatment for MRSA infections. Performed at [facility], [address] [place], [place] [address]   Aerobic/Anaerobic Culture (surgical/deep wound)     Status: None (Preliminary result)   Collection Time: 02/05/20 11:48 AM   Specimen: PATH Other; Tissue  Result Value Ref Range Status   Specimen Description TISSUE SACRAL  Final  Special Requests NONE  Final   Gram Stain   Final    RARE WBC PRESENT, PREDOMINANTLY PMN FEW GRAM POSITIVE COCCI IN PAIRS Performed at [facility], [place] [address] [place], [place] [address]    Culture   Final    RARE VANCOMYCIN RESISTANT ENTEROCOCCUS ISOLATED NO ANAEROBES ISOLATED; CULTURE IN PROGRESS FOR 5 DAYS    Report Status PENDING  Incomplete   Organism ID, Bacteria VANCOMYCIN RESISTANT ENTEROCOCCUS ISOLATED  Final      Susceptibility   Vancomycin resistant enterococcus isolated - MIC*     AMPICILLIN >=32 RESISTANT Resistant     VANCOMYCIN >=32 RESISTANT Resistant     GENTAMICIN SYNERGY SENSITIVE Sensitive     LINEZOLID 2 SENSITIVE Sensitive     * RARE VANCOMYCIN RESISTANT ENTEROCOCCUS ISOLATED  Aerobic/Anaerobic Culture (surgical/deep wound)     Status: None (Preliminary result)   Collection Time: 02/05/20 11:48 AM   Specimen: PATH Other; Tissue  Result Value Ref Range Status   Specimen Description TISSUE  Final   Special Requests LEFT TROCHANTERIC  Final   Gram Stain   Final    RARE WBC PRESENT,BOTH PMN AND MONONUCLEAR FEW GRAM POSITIVE COCCI IN PAIRS Performed at [facility], [address] [place], [place] [address]    Culture   Final    FEW VANCOMYCIN RESISTANT ENTEROCOCCUS ISOLATED NO ANAEROBES ISOLATED; CULTURE IN PROGRESS FOR 5 DAYS    Report Status PENDING  Incomplete   Organism ID, Bacteria VANCOMYCIN RESISTANT ENTEROCOCCUS ISOLATED  Final      Susceptibility   Vancomycin resistant enterococcus isolated - MIC*    AMPICILLIN >=32 RESISTANT Resistant     VANCOMYCIN >=32 RESISTANT Resistant     GENTAMICIN SYNERGY SENSITIVE Sensitive     LINEZOLID 2 SENSITIVE Sensitive     * FEW VANCOMYCIN RESISTANT ENTEROCOCCUS ISOLATED         Radiology Studies: No results found.      Scheduled Meds:  amiodarone  100 mg Per Tube BID   benztropine  0.5 mg Per Tube BID   carbidopa-levodopa  1 tablet Per Tube BID   diltiazem  30 mg Per Tube QID   famotidine  20 mg Per Tube Daily   feeding supplement (PRO-STAT SUGAR FREE 64)  30 mL Per Tube Daily   free water  175 mL Per Tube Q3H   hydrALAZINE  50 mg Per Tube BID   insulin aspart  0-9 Units Subcutaneous Q4H   metoprolol tartrate  12.5 mg Per Tube BID   nutrition supplement (JUVEN)  1 packet Per Tube BID BM   sodium chloride flush  10 mL Intravenous Q12H   thiamine  200 mg Per Tube Daily   Continuous Infusions:  sodium chloride 250 mL (02/04/20 2030)   ampicillin-sulbactam (UNASYN)  IV 3 g (02/08/20 1804)   DAPTOmycin (CUBICIN)  IV Stopped (02/07/20 2244)   feeding supplement (JEVITY 1.5 CAL/FIBER) 1,000 mL (02/08/20 0130)     LOS: 11 days    Enzo Bi, MD [facility]   If 7PM-7AM, please contact night-coverage [email or web address] 02/08/2020, 7:02 PM

## 2020-02-08 NOTE — Progress Notes (Signed)
Occupational Therapy Treatment Patient Details Name: Kyle [name] MRN: [id number] DOB: [birth year] Today's Date: 02/08/2020    History of present illness Kyle [name] is a 74 y.o. male with medical history significant of COVID 11/02/2019.  CVA, prediabetes, PE around 2018 started on Xarelto, chronic tremors of bilateral hands, bipolar disorder, morbid obesity CKD HLD neuropathy chronic atrial fibrillation DM2 COPD, Parkinson disease. He has been in LTAC and was discharged to SNF on 01/25/20. Was febrile when arrived to SNF and sent here to ED.   OT comments  Pt seen for OT tx this date to f/u re: participation/tolerance for ADL/bed mobility as well as with self care ADLs. Pt requires MAX +2 for lateral rolling in bed for peri care following episode of bowel incontinence. With hand over hand guidance from OT, pt does make effort to hold bed rail. Laying in bed, OT engages pt in [place] of R UE and PROM of  L UE. Pt is noted to spontaneously flex shld/ext elbow some to help stabilize during rolling process as well. OT engages pt in gentle digit ext stretches of b/l hands as well to improve elasticity of connective tissue and prevent contracture. Pt mostly tolerates well, some slight grimacing noted with digit extension. Pt appears to be very gradually demonstrating some level of improved tolerance/participation. Anticipate SNF is still most appropriate d/c recommendation.    Follow Up Recommendations  SNF    Equipment Recommendations  Other (comment)(defer)    Recommendations for Other Services      Precautions / Restrictions Precautions Precautions: Fall Precaution Comments: sacral wound and wound vac Restrictions Weight Bearing Restrictions: No Other Position/Activity Restrictions: feeding tube, NPO       Mobility Bed Mobility Overal bed mobility: Needs Assistance Bed Mobility: Rolling Rolling: Max assist;+2 for physical assistance(during peri care d/t episode of bowel incontinence)         General bed mobility comments: Pt able to grasp bilateral rails once hand placed with hand over hand. Pt does make some effort with R LE to attempt to use to propel towards L side-lying, but does ultimately require MAX A  Transfers                      Balance                                           ADL either performed or assessed with clinical judgement   ADL                                         General ADL Comments: OT attempts to engage pt in wiping face with wash cloth this date for which pt required hand over hand MOD A at bed level with HOB elevated. Pt slow to initiate, but when OT initates task for him, he does make effort with R hand to grasp wash cloth and move it.     Vision Baseline Vision/History: Wears glasses     Perception     Praxis      Cognition Arousal/Alertness: Awake/alert Behavior During Therapy: Flat affect Overall Cognitive Status: No family/caregiver present to determine baseline cognitive functioning  General Comments: Pt verbalizes 2-3 times during session, very soft spoken. Somewhat appropriate when responding, Ex: when asked if he is warm temperature wise, pt responds "a little bit"        Exercises Other Exercises Other Exercises: OT facilitates R UE AAROM of shoulder and elbow in available planes for 1 set x10 reps. OT facilitates PROM of L shoulder for 1 set x10 reps in all available planes at bed level   Shoulder Instructions       General Comments      Pertinent Vitals/ Pain       Pain Assessment: Faces Faces Pain [name]: Hurts little more Pain Location: some slight grimace with gentle digit extension stech/ROM Pain Descriptors / Indicators: Grimacing Pain Intervention(s): Limited activity within patient's tolerance;Monitored during session  Home Living                                          Prior  Functioning/Environment              Frequency  Min 1X/week        Progress Toward Goals  OT Goals(current goals can now be found in the care plan section)  Progress towards OT goals: Progressing toward goals  Acute Rehab OT Goals Patient Stated Goal: none stated OT Goal Formulation: Patient unable to participate in goal setting Time For Goal Achievement: 02/11/20 Potential to Achieve Goals: [place] Discharge plan remains appropriate;Frequency remains appropriate    Co-evaluation                 AM-PAC OT "6 Clicks" Daily Activity     Outcome Measure   Help from another person eating meals?: Total Help from another person taking care of personal grooming?: A Lot Help from another person toileting, which includes using toliet, bedpan, or urinal?: Total Help from another person bathing (including washing, rinsing, drying)?: Total Help from another person to put on and taking off regular upper body clothing?: Total Help from another person to put on and taking off regular lower body clothing?: Total 6 Click Score: 7    End of Session    OT Visit Diagnosis: Other abnormalities of gait and mobility (R26.89);Muscle weakness (generalized) (M62.81);Pain   Activity Tolerance Patient tolerated treatment well   Patient Left in bed;with call bell/phone within reach;with bed alarm set;with nursing/sitter in room(nurse-erica assists with peri care and rolling and re-presents with meds)   Nurse Communication          Time: 3536-1443 OT Time Calculation (min): 28 min  Charges: OT General Charges $OT Visit: 1 Visit OT Treatments $Self Care/Home Management : 8-22 mins $Therapeutic Activity: 8-22 mins  Kyle [name], [place], OTR/L ascom [phone] 02/08/20, 4:48 PM

## 2020-02-08 NOTE — Progress Notes (Signed)
Physical Therapy Treatment Patient Details Name: Kyle [name] MRN: [id number] DOB: 1946-06-22 Today's Date: 02/08/2020    History of Present Illness Kyle [name] is a 74 y.o. male with medical history significant of COVID 11/02/2019.  CVA, prediabetes, PE around 2018 started on Xarelto, chronic tremors of bilateral hands, bipolar disorder, morbid obesity CKD HLD neuropathy chronic atrial fibrillation DM2 COPD, Parkinson disease. He has been in [place] and was discharged to SNF on 01/25/20. Was febrile when arrived to SNF and sent here to ED.    PT Comments    Pt awake.  Fever noted noted today but no change in tolerance to session.  He continues with overall weakness and limited participation in therapy session.  Discussed with primary PT.    Follow Up Recommendations  SNF     Equipment Recommendations  Other (comment)(TBD by next venue of care)    Recommendations for Other Services       Precautions / Restrictions Precautions Precautions: Fall Precaution Comments: sacral wound and wound vac Restrictions Weight Bearing Restrictions: No Other Position/Activity Restrictions: feeding tube, NPO    Mobility  Bed Mobility                  Transfers                    Ambulation/Gait                 Stairs             Wheelchair Mobility    Modified Rankin (Stroke Patients Only)       Balance                                            Cognition Arousal/Alertness: Awake/alert Behavior During Therapy: Flat affect Overall Cognitive Status: Within Functional Limits for tasks assessed                                 General Comments: Pt verbalizes 2-3 times during session, very soft spoken      Exercises General Exercises - Lower Extremity Ankle Circles/Pumps: AAROM;Right;10 reps;PROM;Left Short Arc Quad: AAROM;Strengthening;Both;10 reps;PROM Heel Slides: AAROM;Strengthening;Both;10 reps;PROM Hip  ABduction/ADduction: PROM;Strengthening;Both;10 reps Straight Leg Raises: PROM;Strengthening;Both;10 reps    General Comments        Pertinent Vitals/Pain Pain Assessment: No/denies pain    Home Living                      Prior Function            PT Goals (current goals can now be found in the care plan section) Progress towards PT goals: Not progressing toward goals - comment    Frequency    Min 2X/week      PT Plan Current plan remains appropriate    Co-evaluation              AM-PAC PT "6 Clicks" Mobility   Outcome Measure  Help needed turning from your back to your side while in a flat bed without using bedrails?: Total Help needed moving from lying on your back to sitting on the side of a flat bed without using bedrails?: Total Help needed moving to and from a bed to a chair (including a wheelchair)?: Total Help needed standing up from a  chair using your arms (e.g., wheelchair or bedside chair)?: Total Help needed to walk in hospital room?: Total Help needed climbing 3-5 steps with a railing? : Total 6 Click Score: 6    End of Session   Activity Tolerance: Patient tolerated treatment well Patient left: in bed;with call bell/phone within reach;with bed alarm set Nurse Communication: Mobility status PT Visit Diagnosis: Other abnormalities of gait and mobility (R26.89);Muscle weakness (generalized) (M62.81)     Time: 5093-2671 PT Time Calculation (min) (ACUTE ONLY): 10 min  Charges:  $Therapeutic Exercise: 8-22 mins                    Danielle Dess, PTA 02/08/20, 11:08 AM

## 2020-02-09 ENCOUNTER — Inpatient Hospital Stay: Payer: Medicare Other

## 2020-02-09 LAB — GLUCOSE, CAPILLARY
Glucose-Capillary: 125 mg/dL — ABNORMAL HIGH (ref 70–99)
Glucose-Capillary: 126 mg/dL — ABNORMAL HIGH (ref 70–99)
Glucose-Capillary: 130 mg/dL — ABNORMAL HIGH (ref 70–99)
Glucose-Capillary: 131 mg/dL — ABNORMAL HIGH (ref 70–99)
Glucose-Capillary: 143 mg/dL — ABNORMAL HIGH (ref 70–99)
Glucose-Capillary: 149 mg/dL — ABNORMAL HIGH (ref 70–99)

## 2020-02-09 LAB — BASIC METABOLIC PANEL
Anion gap: 12 (ref 5–15)
BUN: 24 mg/dL — ABNORMAL HIGH (ref 8–23)
CO2: 22 mmol/L (ref 22–32)
Calcium: 9.3 mg/dL (ref 8.9–10.3)
Chloride: 106 mmol/L (ref 98–111)
Creatinine, Ser: 0.49 mg/dL — ABNORMAL LOW (ref 0.61–1.24)
GFR calc Af Amer: >60 mL/min (ref >60)
GFR calc non Af Amer: >60 mL/min (ref >60)
Glucose, Bld: 137 mg/dL — ABNORMAL HIGH (ref 70–99)
Potassium: 4.2 mmol/L (ref 3.5–5.1)
Sodium: 140 mmol/L (ref 135–145)

## 2020-02-09 LAB — CBC
HCT: 25.8 % — ABNORMAL LOW (ref 39.0–52.0)
Hemoglobin: 8 g/dL — ABNORMAL LOW (ref 13.0–17.0)
MCH: 32.5 pg (ref 26.0–34.0)
MCHC: 31 g/dL (ref 30.0–36.0)
MCV: 104.9 fL — ABNORMAL HIGH (ref 80.0–100.0)
Platelets: 328 10*3/uL (ref 150–400)
RBC: 2.46 MIL/uL — ABNORMAL LOW (ref 4.22–5.81)
RDW: 15.1 % (ref 11.5–15.5)
WBC: 10.9 10*3/uL — ABNORMAL HIGH (ref 4.0–10.5)
nRBC: 0.2 % (ref 0.0–0.2)

## 2020-02-09 LAB — AEROBIC/ANAEROBIC CULTURE W GRAM STAIN (SURGICAL/DEEP WOUND)

## 2020-02-09 LAB — MAGNESIUM: Magnesium: 2.3 mg/dL (ref 1.7–2.4)

## 2020-02-09 MED ORDER — PRO-STAT SUGAR FREE PO LIQD
30.0000 mL | Freq: Two times a day (BID) | ORAL | Status: DC
  Administered 2020-02-09 – 2020-02-15 (×12): 30 mL

## 2020-02-09 MED ORDER — JEVITY 1.5 CAL/FIBER PO LIQD
1000.0000 mL | ORAL | Status: DC
  Administered 2020-02-09 – 2020-02-15 (×9): 1000 mL

## 2020-02-09 MED ORDER — HYDRALAZINE HCL 50 MG PO TABS
50.0000 mg | ORAL_TABLET | Freq: Three times a day (TID) | ORAL | Status: DC
  Administered 2020-02-10 – 2020-02-15 (×16): 50 mg
  Filled 2020-02-09 (×17): qty 1

## 2020-02-09 MED ORDER — HYDRALAZINE HCL 50 MG PO TABS: 50.0000 mg | ORAL_TABLET | Freq: Three times a day (TID) | ORAL | Status: DC

## 2020-02-09 MED ORDER — APIXABAN 5 MG PO TABS
5.0000 mg | ORAL_TABLET | Freq: Two times a day (BID) | ORAL | Status: DC
  Administered 2020-02-09 – 2020-02-15 (×12): 5 mg
  Filled 2020-02-09 (×12): qty 1

## 2020-02-09 NOTE — Progress Notes (Signed)
Nutrition Follow-up  DOCUMENTATION CODES:   Not applicable  INTERVENTION:  Initiate new TF regimen: -Jevity 1.5 Cal at 70 mL/hr per G-tube (1680 mL goal daily volume, 2520 kcal, 107 grams of protein, 35 grams of fiber, 1277 mL H2O) -Pro-Stat 30 mL BID per G-tube (100 kcal and 15 grams of protein per 30 mL) -Juven packet (each packet provides 95 kcal, 7 grams L-Arginine, 7 grams L-Glutamine, 2.5 grams protein in form of collagen, and vitamins/minerals essential for wound healing) -Total regimen provides 2910 kcal, 142 grams of protein, 35 grams of fiber, 1277 mL H2O  With free water flush of 175 mL Q3hrs patient will receive a total of 2677 mL H2O daily. He will also receive free water with medications.  NUTRITION DIAGNOSIS:   Inadequate oral intake related to dysphagia as evidenced by NPO status, other (comment)(pt with PEG tube).  Ongoing - addressing with TF regimen.  GOAL:   Patient will meet greater than or equal to 90% of their needs  Met with TF regimen.  MONITOR:   Labs, Weight trends, TF tolerance, Skin, I & O's  REASON FOR ASSESSMENT:   Consult Enteral/tube feeding initiation and management  ASSESSMENT:   74 y.o. 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 Pt s/p I & D and VAC placement 3/7  Patient sleeping at time of RD assessment. Tube feeds infusing. Patient has been tolerating tube feed regimen. Patient is ordered for daily weights but after seeing the bed patient is on and all the devices on the bed it is likely going to be very difficult to get accurate weights at this time. Noted  wound VAC in place. Will increase estimated calorie/protein needs and tube feed regimen to promote wound healing.  Enteral Access: G-tube  TF regimen: Jevity 1.5 Cal at 60 mL/hr + Pro-Stat 30 mL daily per tube + Juven 1 packet BID per tube  Medications reviewed and include: famotidine, free water flush 175 mL Q3hrs, Novolog 0-9 units Q4hrs, thiamine 200 mg daily per tube, Unasyn.  Labs reviewed: CBG 126-149, BUN 24, Creatinine 0.49.  Discussed with RN over the phone.  Diet Order:   Diet Order            Diet NPO time specified  Diet effective midnight             EDUCATION NEEDS:   Not appropriate for education at this time  Skin:  Skin Assessment: Reviewed RN Assessment(sacral decubitus wound (15 cm x 15 cm) and infected left hip/leg decubitus wound (10 cm x 5 cm), and debridement of skin and subcutaneous tissue of infected left ischial decubitus wound (2 cm x 2 cm), for total area of 279 sqcm.)  Last BM:  02/09/2020 medium type 6  Height:   Ht Readings from Last 1 Encounters:  01/27/20 '5\' 9"'$  (1.753 m)   Weight:   Wt Readings from Last 1 Encounters:  08/30/19 113.2 kg  Patient was 86.2 kg on 01/27/2020. Unsure why that weight is not pulling into note.  Ideal Body Weight:  72.7 kg  BMI:  Body mass index is 28.06 kg/m.  Estimated Nutritional Needs:   Kcal:  2845-3015 (33-35 kcal/kg)  Protein:  130-145 grams (1.5-1.7 grams/kg)  Fluid:  2.2-2.6 L/day  Jacklynn Barnacle, MS, RD, LDN Pager number available on Amion

## 2020-02-09 NOTE — Plan of Care (Signed)
  Problem: Nutrition: Goal: Adequate nutrition will be maintained Outcome: Progressing   

## 2020-02-09 NOTE — Consult Note (Signed)
WOC Nurse wound follow up Wound type: surgical  Measurement: see previous notes Wound bed: 1. Left trochanter 80% yellow/20% pink 2. Sacrum: 90% pink/10% yellow 3. Left ischium: 90% yellow (this wound does not have NPWT) Drainage (amount, consistency, odor) minimal in VAC canister Periwound: intact  Dressing procedure/placement/frequency:  Skin protected between sacral wound and trochanter wound as well as from trochanter to left thigh with VAC drape for foam bridge  Filled wound with  1. Left trochanter __1_ piece of black foam 2. Sacrum 3 pc of black foam 1pc for bridge from sacrum to trochanter; 1 pc for bridge from trochanter to thigh Sealed NPWT dressing at HG Patient tolerated procedure well   WOC nurse will continue to provide NPWT dressing changed due to the complexity of the dressing change.   Veleka Djordjevic [facility], CNS, [employer] [phone]
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
## 2020-02-09 NOTE — Progress Notes (Signed)
PROGRESS NOTE    Kyle [name]  YTK:[id number] DOB: [birth year] DOA: 01/27/2020  PCP: [facility]    LOS - 12   Brief Narrative:  Kyle [name] a 74 y.o.malewith medical history significant of COVID12/08/2019. CVA, prediabetes, PE around 2018 started on Xarelto, chronic tremors of bilateral hands, bipolar disorder, morbid obesity,CKD, HLD, neuropathy, chronic atrial fibrillation, DM2,COPD, and Parkinson disease.He presented withfever from SNF. He had been discharged from SNF after a 10 day inpatient stay for acute hypoxemic respiratory failure due to COVID-19. During this stay the patient was treated with Remdesivir, decadron, Rocephin and azithromycin.  In December patient was admitted for Covid with acute respiratory hypoxemic failure treated remdesivir 10 days of Decadron and Rocephin and azithromycin for possible bacterial superinfection in [place]. This was followed by a roughly three week stay at [facility]. This was followed by a three day stay at [facility].   His hospital stay in [place] was complicated by tachycardia patient was seen by cardiology started Cardizem metoprolol.The patient suffered respiratory arrest on 24 November 2019 and had to be intubated.He had an EEG showing slowing consistent with mild residual encephalopathy patient required tracheostomy and PEG tube placement. He later on developed pressure sacral decubitus ulcer which required debridement.He required broad-spectrum antibiotics IV vancomycin and Zosyn. He thentransferred to [name] in [place].His antibiotics were later simplified to cefepime. His trach was de cannulated.Patient remains nonverbal.In February he developed pneumonia worrisome for aspiration and was treated Unasyn.Hefailed trial of decannulation plan for him to have a ENT consult done to place an hour trach he had copious secretion requiring frequent suctioning as well as nasotracheal  suctioning.Patient was finally yesterday discharged from [place] to SNF3/02/2020, but continued to be febrile.As soon as he presented to facility and was transferred to [facility] emergency department today.  The patient was admitted to a telemetry bed. General surgery and Infectious disease were consulted, and the patient went for operative I & D on 01/28/2020. Cultures were taken. He is receiving IV  The patient has been admitted to a telemetry bed. General surgery has been consulted and has taken the patient for operative I&D of the sacral wound on 01/28/2020. Cultures were taken. Infectious disease has also been consulted.The patient is receiving IV Zosyn and Vancomycin. Nutrition has been consulted and the patient has been restarted on tube feeds. SLP has been consulted for swallow eval, although this has not yet taken place as the patient has been in procedures. Cultures have grown enterococcus and possible anaerobes. Infectious disease feels that it will be unlikely that these wounds will heal given the patient's poor nutritional and mobility status. He has recommended palliative care consult.Returned to the OR again3/9, 3/11and 3/15 for further debridement.  Subjective 3/15: Pt had temp to 100.5 overnight.    Pt was sleepy today and didn't answer questions.    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 Lactic acid was 1.7 and procalcitonin was 0.28. Initially this was felt to be due to pneumonia. However the patient had received treatment with  Unasyn for pneumonia in February. CXR demonstrated persistent  radiological evidence of pneumonia, but clinically the patient has no adventitious lung sounds, no cough, and he is saturating 98% on room air. However, CT does show evidence of Stage IV decubitus sacral ulcer with abscess and possible bone involvement. He has gone for multiple surgical debridements with general surgerythis admission.   Stage IV sacral decubitus ulcerwith abscess and possible bony involvement.  S/P I & D with general surgeryx 3. The patient has a history of shrapnel, sowould not be a good candidate for MRI.CRP elevated at 9.7. ESR is 126.Albumin 2.1. Culture has grown outVanc resistantE. Faecium and possible anaerobe. --ID consulted, appreciate recommendations --unlikely sacral wound will heal with poor nutritional and functional status, palliative consulted --general surgery following PLAN: --ContinueonIV Unasyn and Daptomycin  --fluconazole 400 mg x1 week via G-tube, per ID --wound vac change MWF --palliative care following, patient will be followed by outpatient palliative at SNF after discharge --patient to be discharged with wound VAC --No further surgical procedure, per surgery --wound care, dressing changes, off loading, air mattress, and Wound Clinic follow up.  ?Recurrent aspiration pneumonia versus HCAP:resolved ENT was able to remove a large piece of retained material from vallecula on 3/11. --Speech evaluations, ongoing --continue tube feeds by PEG tube  Dysphagia:mgmt as above  Hypernatremia: Resolved.  Napeaked to 153. Getting free waterflushes by PEG tube. --Free water flushes: 175 cc every 3 hours --Adjust free water as needed to maintain normal sodium level  Hypertension: Chronic. Continue home medications.  Severe protein calorie malnutrition: Nutrition consult appreciated. This severely impacts the patient's ability to heal these wounds.  Anemia: Likely due to  chronic disease.  Stable and no evidence of bleeding. --Monitor CBC  Chronic atrial fibrillation(HCC)-The patient has a CHADS2VASC score of >3.  Rate controlled. --Resume eliquis today --Continue metoprolol and amiodarone   Type 2 Diabetes: Hb A1c is 4.2. Glucoses to be followed by FSBS and sensitive SSI.  --Oral antihyperglycemics have been held.  --carbohydrate controlled diet/tube feeds and hypoglycemic protocol.  Parkinson disease: Noted. Likely playing a part in patient's history of aspiration. --ContinueCogentin, Sinemet  Encephalopathy: Status post hypoxic respiratory arrest requiring intubation in the setting of Covid in December 2020.Patient with some minimal improvements per family started to respond to his name at times but mainly remains nonverbal bedbound incontinent status post PEG tube. Lurline Idol has been decannulated perLTAC notes if patient has significant secretions may need to replace tracheostomy. At this point appears to be stable.   DVT prophylaxis:SCD's Code Status: DNR Family Communication:none at bedside during encounter  Disposition Plan:To SNF pending bed offer.  Barrier currently is pt's daptomycin is too expensive (Linezolid on the expensive list as well), so SNF will not accept.  Due to Vanc resistantE. Faecium, no other abx option available.     Consultants:  General Surgery  Infectious Disease  Palliative Care  Procedures:  Surgical debridement of sacral decubitus ulcer x 2  Antimicrobials:  Unasyn, Daptomycin  Objective: Vitals:   02/08/20 2046 02/09/20 0601 02/09/20 0903 02/09/20 1219  BP: (!) 175/89 (!) 155/85 (!) 156/90 129/66  Pulse: 99 (!) 102 99 84  Resp: _0 Temp: (!) 100.5 F (38.1 C) 98.6 F (37 C) 98.6 F (37 C) 99.5 F (37.5 C)  TempSrc: Oral Oral Oral Oral  SpO2: 93% 96% 97% 95%  Weight:      Height:        Intake/Output Summary (Last 24 hours) at 02/09/2020 1633 Last data  filed at 02/09/2020 4782 Gross per  24 hour  Intake --  Output 1500 ml  Net -1500 ml   Filed Weights   01/27/20 2220  Weight: 86.2 kg    Examination:  Constitutional: NAD, lethargic HEENT: conjunctivae and lids normal, EOMI CV: RRR no M,R,G. Distal pulses +2.  No cyanosis.   RESP: CTA B/L over anterior, normal respiratory effort  GI: +BS, NTND, tube feed present Extremities: No effusions, edema, or tenderness in BLE SKIN: warm. Neuro: II - XII grossly intact.  Wound vac draining serosanguinous fluid   Data Reviewed: I have personally reviewed following labs and imaging studies  CBC: Recent Labs  Lab 02/04/20 0640 02/05/20 0440 02/06/20 0630 02/08/20 0808 02/09/20 0606  WBC 7.6 7.6 7.4 7.7 10.9*  HGB 8.3* 7.7* 8.6* 7.9* 8.0*  HCT 28.3* 26.3* 28.1* 25.1* 25.8*  MCV 111.9* 110.5* 108.5* 104.1* 104.9*  PLT 346 356 323 287 503   Basic Metabolic Panel: Recent Labs  Lab 02/04/20 0640 02/05/20 0440 02/06/20 0630 02/08/20 0808 02/09/20 0606  NA 151* 145 144 140 140  K 4.2 3.9 4.1 4.1 4.2  CL 114* 110 108 105 106  CO2 _0 GLUCOSE 117* 80 123* 122* 137*  BUN _1 24*  CREATININE 0.45* 0.44* 0.48* 0.51* 0.49*  CALCIUM 10.2 9.6 9.5 9.4 9.3  MG 2.9* 2.6* 2.5* 2.6* 2.3   GFR: Estimated Creatinine Clearance: 90.8 mL/min (A) (by C-G formula based on SCr of 0.49 mg/dL (L)). Liver Function Tests: No results for input(s): AST, ALT, ALKPHOS, BILITOT, PROT, ALBUMIN in the last 168 hours. No results for input(s): LIPASE, AMYLASE in the last 168 hours. No results for input(s): AMMONIA in the last 168 hours. Coagulation Profile: No results for input(s): INR, PROTIME in the last 168 hours. Cardiac Enzymes: Recent Labs  Lab 02/08/20 0808  CKTOTAL 60   BNP (last 3 results) No results for input(s): PROBNP in the last 8760 hours. HbA1C: No results for input(s): HGBA1C in the last 72 hours. CBG: Recent Labs  Lab 02/08/20 2043 02/09/20 0004  02/09/20 0354 02/09/20 0906 02/09/20 1216  GLUCAP 141* 149* 126* 143* 131*   Lipid Profile: No results for input(s): CHOL, HDL, LDLCALC, TRIG, CHOLHDL, LDLDIRECT in the last 72 hours. Thyroid Function Tests: No results for input(s): TSH, T4TOTAL, FREET4, T3FREE, THYROIDAB in the last 72 hours. Anemia Panel: No results for input(s): VITAMINB12, FOLATE, FERRITIN, TIBC, IRON, RETICCTPCT in the last 72 hours. Sepsis Labs: No results for input(s): PROCALCITON, LATICACIDVEN in the last 168 hours.  Recent Results (from the past 240 hour(s))  MRSA PCR Screening     Status: None   Collection Time: 01/31/20  8:51 PM   Specimen: Nasal Mucosa; Nasopharyngeal  Result Value Ref Range Status   MRSA by PCR NEGATIVE NEGATIVE Final    Comment:        The GeneXpert MRSA Assay (FDA approved for NASAL specimens only), is one component of a comprehensive MRSA colonization surveillance program. It is not intended to diagnose MRSA infection nor to guide or monitor treatment for MRSA infections. Performed at [facility], [address] [place], [place] [address]   Aerobic/Anaerobic Culture (surgical/deep wound)     Status: None (Preliminary result)   Collection Time: 02/05/20 11:48 AM   Specimen: PATH Other; Tissue  Result Value Ref Range Status   Specimen Description TISSUE SACRAL  Final   Special Requests NONE  Final   Gram Stain   Final    RARE WBC PRESENT, PREDOMINANTLY  PMN FEW GRAM POSITIVE COCCI IN PAIRS Performed at [facility], [place] [address], [place], [place] [address]    Culture   Final    RARE VANCOMYCIN RESISTANT ENTEROCOCCUS ISOLATED NO ANAEROBES ISOLATED; CULTURE IN PROGRESS FOR 5 DAYS    Report Status PENDING  Incomplete   Organism ID, Bacteria VANCOMYCIN RESISTANT ENTEROCOCCUS ISOLATED  Final      Susceptibility   Vancomycin resistant enterococcus isolated - MIC*    AMPICILLIN >=32 RESISTANT Resistant     VANCOMYCIN >=32 RESISTANT Resistant      GENTAMICIN SYNERGY SENSITIVE Sensitive     LINEZOLID 2 SENSITIVE Sensitive     * RARE VANCOMYCIN RESISTANT ENTEROCOCCUS ISOLATED  Aerobic/Anaerobic Culture (surgical/deep wound)     Status: None   Collection Time: 02/05/20 11:48 AM   Specimen: PATH Other; Tissue  Result Value Ref Range Status   Specimen Description TISSUE  Final   Special Requests LEFT TROCHANTERIC  Final   Gram Stain   Final    RARE WBC PRESENT,BOTH PMN AND MONONUCLEAR FEW GRAM POSITIVE COCCI IN PAIRS    Culture   Final    FEW VANCOMYCIN RESISTANT ENTEROCOCCUS ISOLATED NO ANAEROBES ISOLATED Performed at [facility], [address] [place], [place] [address]    Report Status 02/09/2020 FINAL  Final   Organism ID, Bacteria VANCOMYCIN RESISTANT ENTEROCOCCUS ISOLATED  Final      Susceptibility   Vancomycin resistant enterococcus isolated - MIC*    AMPICILLIN >=32 RESISTANT Resistant     VANCOMYCIN >=32 RESISTANT Resistant     GENTAMICIN SYNERGY SENSITIVE Sensitive     LINEZOLID 2 SENSITIVE Sensitive     * FEW VANCOMYCIN RESISTANT ENTEROCOCCUS ISOLATED  Culture, blood (Routine X 2) w Reflex to ID Panel     Status: None (Preliminary result)   Collection Time: 02/08/20 10:41 AM   Specimen: BLOOD  Result Value Ref Range Status   Specimen Description BLOOD BLOOD LEFT ARM  Final   Special Requests   Final    BOTTLES DRAWN AEROBIC AND ANAEROBIC Blood Culture adequate volume   Culture   Final    NO GROWTH < 24 HOURS Performed at [facility], [address] [place], [place] [address]    Report Status PENDING  Incomplete  Culture, blood (Routine X 2) w Reflex to ID Panel     Status: None (Preliminary result)   Collection Time: 02/08/20 10:51 AM   Specimen: BLOOD  Result Value Ref Range Status   Specimen Description BLOOD BLOOD RIGHT HAND  Final   Special Requests   Final    BOTTLES DRAWN AEROBIC AND ANAEROBIC Blood Culture adequate volume   Culture   Final    NO GROWTH < 24 HOURS Performed at  [facility], [address], [place], [place] [address]    Report Status PENDING  Incomplete         Radiology Studies: No results found.      Scheduled Meds: . amiodarone  100 mg Per Tube BID  . benztropine  0.5 mg Per Tube BID  . carbidopa-levodopa  1 tablet Per Tube BID  . diltiazem  30 mg Per Tube QID  . famotidine  20 mg Per Tube Daily  . feeding supplement (PRO-STAT SUGAR FREE 64)  30 mL Per Tube BID  . fluconazole  400 mg Oral Daily  . free water  175 mL Per Tube Q3H  . hydrALAZINE  50 mg Per Tube Q8H  . insulin aspart  0-9 Units Subcutaneous Q4H  . metoprolol tartrate  12.5 mg Per Tube BID  . nutrition supplement (JUVEN)  1 packet Per Tube BID BM  . sodium chloride flush  10 mL Intravenous Q12H  . thiamine  200 mg Per Tube Daily   Continuous Infusions: . sodium chloride 250 mL (02/04/20 2030)  . ampicillin-sulbactam (UNASYN) IV 3 g (02/09/20 1222)  . DAPTOmycin (CUBICIN)  IV 700 mg (02/08/20 2135)  . feeding supplement (JEVITY 1.5 CAL/FIBER) 1,000 mL (02/09/20 1436)     LOS: 12 days    Enzo Bi, MD [facility]   If 7PM-7AM, please contact night-coverage [email or web address] 02/09/2020, 4:33 PM

## 2020-02-10 ENCOUNTER — Inpatient Hospital Stay: Payer: Medicare Other

## 2020-02-10 LAB — CBC
HCT: 25.4 % — ABNORMAL LOW (ref 39.0–52.0)
Hemoglobin: 7.7 g/dL — ABNORMAL LOW (ref 13.0–17.0)
MCH: 32.2 pg (ref 26.0–34.0)
MCHC: 30.3 g/dL (ref 30.0–36.0)
MCV: 106.3 fL — ABNORMAL HIGH (ref 80.0–100.0)
Platelets: 322 10*3/uL (ref 150–400)
RBC: 2.39 MIL/uL — ABNORMAL LOW (ref 4.22–5.81)
RDW: 15 % (ref 11.5–15.5)
WBC: 9.1 10*3/uL (ref 4.0–10.5)
nRBC: 0 % (ref 0.0–0.2)

## 2020-02-10 LAB — MAGNESIUM: Magnesium: 2.2 mg/dL (ref 1.7–2.4)

## 2020-02-10 LAB — AEROBIC/ANAEROBIC CULTURE W GRAM STAIN (SURGICAL/DEEP WOUND)

## 2020-02-10 LAB — GLUCOSE, CAPILLARY
Glucose-Capillary: 117 mg/dL — ABNORMAL HIGH (ref 70–99)
Glucose-Capillary: 132 mg/dL — ABNORMAL HIGH (ref 70–99)
Glucose-Capillary: 133 mg/dL — ABNORMAL HIGH (ref 70–99)
Glucose-Capillary: 154 mg/dL — ABNORMAL HIGH (ref 70–99)
Glucose-Capillary: 88 mg/dL (ref 70–99)
Glucose-Capillary: 90 mg/dL (ref 70–99)

## 2020-02-10 LAB — BASIC METABOLIC PANEL
Anion gap: 8 (ref 5–15)
BUN: <5 mg/dL — ABNORMAL LOW (ref 8–23)
CO2: 24 mmol/L (ref 22–32)
Calcium: 9.5 mg/dL (ref 8.9–10.3)
Chloride: 111 mmol/L (ref 98–111)
Creatinine, Ser: 0.45 mg/dL — ABNORMAL LOW (ref 0.61–1.24)
GFR calc Af Amer: >60 mL/min (ref >60)
GFR calc non Af Amer: >60 mL/min (ref >60)
Glucose, Bld: 103 mg/dL — ABNORMAL HIGH (ref 70–99)
Potassium: 4.3 mmol/L (ref 3.5–5.1)
Sodium: 143 mmol/L (ref 135–145)

## 2020-02-10 NOTE — Progress Notes (Signed)
   02/10/20 0434  Level of Consciousness  Level of Consciousness Alert  MEWS Score  MEWS Temp 1  MEWS Systolic 0  MEWS Pulse 0  MEWS RR 0  MEWS LOC 0  MEWS Score 1  MEWS Score Color Green  MEWS Assessment  Is this an acute change? No
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
## 2020-02-11 DIAGNOSIS — G931 Anoxic brain damage, not elsewhere classified: Secondary | ICD-10-CM | POA: Diagnosis present

## 2020-02-11 DIAGNOSIS — R1310 Dysphagia, unspecified: Secondary | ICD-10-CM

## 2020-02-11 DIAGNOSIS — D649 Anemia, unspecified: Secondary | ICD-10-CM | POA: Diagnosis present

## 2020-02-11 LAB — MAGNESIUM: Magnesium: 2.5 mg/dL — ABNORMAL HIGH (ref 1.7–2.4)

## 2020-02-11 LAB — CBC
HCT: 24.2 % — ABNORMAL LOW (ref 39.0–52.0)
Hemoglobin: 7.4 g/dL — ABNORMAL LOW (ref 13.0–17.0)
MCH: 32.2 pg (ref 26.0–34.0)
MCHC: 30.6 g/dL (ref 30.0–36.0)
MCV: 105.2 fL — ABNORMAL HIGH (ref 80.0–100.0)
Platelets: 342 10*3/uL (ref 150–400)
RBC: 2.3 MIL/uL — ABNORMAL LOW (ref 4.22–5.81)
RDW: 15 % (ref 11.5–15.5)
WBC: 8.5 10*3/uL (ref 4.0–10.5)
nRBC: 0.4 % — ABNORMAL HIGH (ref 0.0–0.2)

## 2020-02-11 LAB — BASIC METABOLIC PANEL
Anion gap: 11 (ref 5–15)
BUN: 24 mg/dL — ABNORMAL HIGH (ref 8–23)
CO2: 23 mmol/L (ref 22–32)
Calcium: 9.1 mg/dL (ref 8.9–10.3)
Chloride: 109 mmol/L (ref 98–111)
Creatinine, Ser: 0.45 mg/dL — ABNORMAL LOW (ref 0.61–1.24)
GFR calc Af Amer: >60 mL/min (ref >60)
GFR calc non Af Amer: >60 mL/min (ref >60)
Glucose, Bld: 118 mg/dL — ABNORMAL HIGH (ref 70–99)
Potassium: 3.8 mmol/L (ref 3.5–5.1)
Sodium: 143 mmol/L (ref 135–145)

## 2020-02-11 LAB — GLUCOSE, CAPILLARY
Glucose-Capillary: 106 mg/dL — ABNORMAL HIGH (ref 70–99)
Glucose-Capillary: 112 mg/dL — ABNORMAL HIGH (ref 70–99)
Glucose-Capillary: 123 mg/dL — ABNORMAL HIGH (ref 70–99)
Glucose-Capillary: 124 mg/dL — ABNORMAL HIGH (ref 70–99)
Glucose-Capillary: 127 mg/dL — ABNORMAL HIGH (ref 70–99)
Glucose-Capillary: 130 mg/dL — ABNORMAL HIGH (ref 70–99)

## 2020-02-11 NOTE — Progress Notes (Signed)
Physical Therapy Treatment Patient Details Name: Jhostin Epps MRN: [id number] DOB: August 15, 1946 Today's Date: 02/11/2020    History of Present Illness Emilliano Dilworth is a 74 y.o. male with medical history significant of COVID 11/02/2019.  CVA, prediabetes, PE around 2018 started on Xarelto, chronic tremors of bilateral hands, bipolar disorder, morbid obesity CKD HLD neuropathy chronic atrial fibrillation DM2 COPD, Parkinson disease. He has been in [place] and was discharged to SNF on 01/25/20. Was febrile when arrived to SNF and sent here to ED.    PT Comments    Patient demonstrates declining strength and ability to participate in ther-ex this session. He was minimally able to lift his shoulders, even with PT assistance he was unable to go through full excursion of knee or elbow flexion-extension ROM. PT will continue to follow.    Follow Up Recommendations  SNF     Equipment Recommendations  Other (comment)(TBD at next venue of care.)    Recommendations for Other Services       Precautions / Restrictions Precautions Precautions: Fall Precaution Comments: sacral wound and wound vac Restrictions Weight Bearing Restrictions: No Other Position/Activity Restrictions: feeding tube, NPO    Mobility  Bed Mobility               General bed mobility comments: No attempt at mobility this date given his lack of engagement in exercises.  Transfers                    Ambulation/Gait                 Stairs             Wheelchair Mobility    Modified Rankin (Stroke Patients Only)       Balance       Sitting balance - Comments: deferred                                    Cognition Arousal/Alertness: Awake/alert Behavior During Therapy: Flat affect Overall Cognitive Status: Within Functional Limits for tasks assessed                                 General Comments: Pt verbalizes 2-3 times during session, very soft  spoken      Exercises General Exercises - Upper Extremity Shoulder Flexion: AAROM;Both;10 reps;Strengthening(Limited ROM bilaterally more assist from patient on L than R) Digit Composite Flexion: AROM;AAROM;Both;10 reps General Exercises - Lower Extremity Ankle Circles/Pumps: AAROM;PROM;10 reps(Very minimal assistance from patient) Heel Slides: AAROM;Strengthening;Both;10 reps;PROM    General Comments        Pertinent Vitals/Pain Pain Location: No pain expressions or verbalized this session.    Home Living                      Prior Function            PT Goals (current goals can now be found in the care plan section)      Frequency    Min 2X/week      PT Plan Current plan remains appropriate    Co-evaluation              AM-PAC PT "6 Clicks" Mobility   Outcome Measure  Help needed turning from your back to your side while in a flat bed without using bedrails?: Total Help  needed moving from lying on your back to sitting on the side of a flat bed without using bedrails?: Total Help needed moving to and from a bed to a chair (including a wheelchair)?: Total Help needed standing up from a chair using your arms (e.g., wheelchair or bedside chair)?: Total Help needed to walk in hospital room?: Total Help needed climbing 3-5 steps with a railing? : Total 6 Click Score: 6    End of Session   Activity Tolerance: Other (comment)(Patient unable to provide significant meaningful volitional contractions this date.) Patient left: in bed;with call bell/phone within reach;with bed alarm set Nurse Communication: Mobility status PT Visit Diagnosis: Other abnormalities of gait and mobility (R26.89);Muscle weakness (generalized) (M62.81)     Time: 2683-4196 PT Time Calculation (min) (ACUTE ONLY): 10 min  Charges:  $Therapeutic Exercise: 8-22 mins           Alva Garnet PT, DPT, CSCS             02/11/2020, 11:11 AM
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
## 2020-02-12 LAB — GLUCOSE, CAPILLARY
Glucose-Capillary: 106 mg/dL — ABNORMAL HIGH (ref 70–99)
Glucose-Capillary: 111 mg/dL — ABNORMAL HIGH (ref 70–99)
Glucose-Capillary: 111 mg/dL — ABNORMAL HIGH (ref 70–99)
Glucose-Capillary: 115 mg/dL — ABNORMAL HIGH (ref 70–99)
Glucose-Capillary: 121 mg/dL — ABNORMAL HIGH (ref 70–99)
Glucose-Capillary: 128 mg/dL — ABNORMAL HIGH (ref 70–99)
Glucose-Capillary: 130 mg/dL — ABNORMAL HIGH (ref 70–99)

## 2020-02-12 LAB — MAGNESIUM: Magnesium: 2.3 mg/dL (ref 1.7–2.4)

## 2020-02-12 LAB — BASIC METABOLIC PANEL
Anion gap: 9 (ref 5–15)
BUN: 22 mg/dL (ref 8–23)
CO2: 23 mmol/L (ref 22–32)
Calcium: 9.1 mg/dL (ref 8.9–10.3)
Chloride: 108 mmol/L (ref 98–111)
Creatinine, Ser: 0.56 mg/dL — ABNORMAL LOW (ref 0.61–1.24)
GFR calc Af Amer: >60 mL/min (ref >60)
GFR calc non Af Amer: >60 mL/min (ref >60)
Glucose, Bld: 135 mg/dL — ABNORMAL HIGH (ref 70–99)
Potassium: 3.9 mmol/L (ref 3.5–5.1)
Sodium: 140 mmol/L (ref 135–145)

## 2020-02-12 LAB — CBC
HCT: 25.6 % — ABNORMAL LOW (ref 39.0–52.0)
Hemoglobin: 7.9 g/dL — ABNORMAL LOW (ref 13.0–17.0)
MCH: 32.1 pg (ref 26.0–34.0)
MCHC: 30.9 g/dL (ref 30.0–36.0)
MCV: 104.1 fL — ABNORMAL HIGH (ref 80.0–100.0)
Platelets: 363 10*3/uL (ref 150–400)
RBC: 2.46 MIL/uL — ABNORMAL LOW (ref 4.22–5.81)
RDW: 14.7 % (ref 11.5–15.5)
WBC: 9.5 10*3/uL (ref 4.0–10.5)
nRBC: 0.4 % — ABNORMAL HIGH (ref 0.0–0.2)

## 2020-02-12 NOTE — Progress Notes (Signed)
[place] INFECTIOUS DISEASE PROGRESS NOTE Date of Admission:  01/27/2020     ID: Kyle [name] is a 74 y.o. 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  ROS  Unable to obtain  Medications:  Antibiotics Given (last 72 hours)    Date/Time Action Medication Dose Rate   02/09/20 1739 New Bag/Given   Ampicillin-Sulbactam (UNASYN) 3 g in sodium chloride 0.9 % 100 mL IVPB 3 g 200 mL/hr   02/09/20 2008 New Bag/Given   DAPTOmycin (CUBICIN) 700 mg in sodium chloride 0.9 % IVPB 700 mg 228 mL/hr   02/10/20 0016 New Bag/Given   Ampicillin-Sulbactam (UNASYN) 3 g in sodium chloride 0.9 % 100 mL IVPB 3 g 200 mL/hr   02/10/20 0539 New Bag/Given   Ampicillin-Sulbactam (UNASYN) 3 g in sodium chloride 0.9 % 100 mL IVPB 3 g 200 mL/hr   02/10/20 1247 New Bag/Given   Ampicillin-Sulbactam (UNASYN) 3 g in sodium chloride 0.9 % 100 mL IVPB 3 g 200 mL/hr   02/10/20 1730 New Bag/Given   Ampicillin-Sulbactam (UNASYN) 3 g in sodium chloride 0.9 % 100 mL IVPB 3 g 200 mL/hr   02/10/20 2146 New Bag/Given   DAPTOmycin (CUBICIN) 700 mg in sodium chloride 0.9 % IVPB 700 mg 228 mL/hr   02/11/20 0031 New Bag/Given   Ampicillin-Sulbactam (UNASYN) 3 g in sodium chloride 0.9 % 100 mL IVPB 3 g 200 mL/hr   02/11/20 0555 New Bag/Given   Ampicillin-Sulbactam (UNASYN) 3 g in sodium chloride 0.9 % 100 mL IVPB 3 g 200 mL/hr   02/11/20 1224 New  Bag/Given   Ampicillin-Sulbactam (UNASYN) 3 g in sodium chloride 0.9 % 100 mL IVPB 3 g 200 mL/hr   02/11/20 1639 New Bag/Given   Ampicillin-Sulbactam (UNASYN) 3 g in sodium chloride 0.9 % 100 mL IVPB 3 g 200 mL/hr   02/12/20 0023 New Bag/Given   DAPTOmycin (CUBICIN) 700 mg in sodium chloride 0.9 % IVPB 700 mg 228 mL/hr   02/12/20 0106 New Bag/Given   Ampicillin-Sulbactam (UNASYN) 3 g in sodium chloride 0.9 % 100 mL IVPB 3 g 200 mL/hr   02/12/20 0522 New Bag/Given   Ampicillin-Sulbactam (UNASYN) 3 g in sodium chloride 0.9 % 100 mL IVPB 3 g 200 mL/hr   02/12/20 1150 New Bag/Given   Ampicillin-Sulbactam (UNASYN) 3 g in sodium chloride 0.9 % 100 mL IVPB 3 g 200 mL/hr     . amiodarone  100 mg Per Tube BID  . apixaban  5 mg Per Tube BID  . benztropine  0.5 mg Per Tube BID  . carbidopa-levodopa  1 tablet Per Tube BID  . diltiazem  30 mg Per Tube QID  . famotidine  20 mg Per Tube Daily  . feeding supplement (PRO-STAT SUGAR FREE 64)  30 mL Per Tube BID  . fluconazole  400 mg Oral Daily  . free water  175 mL Per Tube Q3H  . hydrALAZINE  50 mg Per Tube Q8H  .  insulin aspart  0-9 Units Subcutaneous Q4H  . metoprolol tartrate  12.5 mg Per Tube BID  . nutrition supplement (JUVEN)  1 packet Per Tube BID BM  . sodium chloride flush  10 mL Intravenous Q12H  . thiamine  200 mg Per Tube Daily    Objective: Vital signs in last 24 hours: Temp:  [98 F (36.7 C)-98.7 F (37.1 C)] 98.2 F (36.8 C) (03/22 1510) Pulse Rate:  [72-82] 74 (03/22 1510) Resp:  [17-19] 18 (03/22 1510) BP: (137-154)/(62-80) 154/74 (03/22 1510) SpO2:  [97 %-100 %] 100 % (03/22 1510) Constitutional: He is awake,but minimally interactive  HENT: anicetric  Mouth/Throat: Oropharynx is clear and dry. No oropharyngeal exudate.  Cardiovascular: Normal rate, regular rhythm and normal heart sounds. Pulmonary/Chest: Effort normal and breath sounds normal.   Abdominal: Soft. Bowel sounds are normal. He exhibits no distension. PEG  Tube in place Lymphadenopathy: He has no cervical adenopathy.  Neurological: confused Skin: wound vac in place  Psychiatric: unable to assess  Lab Results Recent Labs    02/11/20 0652 02/12/20 0736  WBC 8.5 9.5  HGB 7.4* 7.9*  HCT 24.2* 25.6*  NA 143 140  K 3.8 3.9  CL 109 108  CO2 23 23  BUN 24* 22  CREATININE 0.45* 0.56*    Microbiology: Results for orders placed or performed during the hospital encounter of 01/27/20  Blood Culture (routine x 2)     Status: None   Collection Time: 01/27/20 10:42 PM   Specimen: BLOOD  Result Value Ref Range Status   Specimen Description BLOOD BLOOD LEFT HAND  Final   Special Requests   Final    BOTTLES DRAWN AEROBIC AND ANAEROBIC Blood Culture adequate volume   Culture   Final    NO GROWTH 5 DAYS Performed at [facility], [address], [place], [place] [address]    Report Status 02/01/2020 FINAL  Final  Blood Culture (routine x 2)     Status: None   Collection Time: 01/27/20 10:42 PM   Specimen: BLOOD  Result Value Ref Range Status   Specimen Description BLOOD BLOOD LEFT HAND  Final   Special Requests   Final    BOTTLES DRAWN AEROBIC AND ANAEROBIC Blood Culture adequate volume   Culture   Final    NO GROWTH 5 DAYS Performed at [facility], [address] [place],  [address]    Report Status 02/01/2020 FINAL  Final  Respiratory Panel by RT PCR (Flu A&B, Covid) - Nasopharyngeal Swab     Status: None   Collection Time: 01/27/20 11:57 PM   Specimen: Nasopharyngeal Swab  Result Value Ref Range Status   SARS Coronavirus 2 by RT PCR NEGATIVE NEGATIVE Final    Comment: (NOTE) SARS-CoV-2 target nucleic acids are NOT DETECTED. The SARS-CoV-2 RNA is generally detectable in upper respiratoy specimens during the acute phase of infection. The lowest concentration of SARS-CoV-2 viral copies this assay can detect is 131 copies/mL. A negative result does not preclude SARS-Cov-2 infection and should not be  used as the sole basis for treatment or other patient management decisions. A negative result may occur with  improper specimen collection/handling, submission of specimen other than nasopharyngeal swab, presence of viral mutation(s) within the areas targeted by this assay, and inadequate number of viral copies (<131 copies/mL). A negative result must be combined with clinical observations, patient history, and epidemiological information. The expected result is Negative. Fact Sheet for Patients:  PinkCheek.be Fact Sheet for Healthcare Providers:  GravelBags.it This test is not yet ap proved or cleared by the [place] and  has been authorized for detection and/or diagnosis of SARS-CoV-2 by FDA under an Emergency Use Authorization (EUA). This EUA will remain  in effect (meaning this test can be used) for the duration of the COVID-19 declaration under Section 564(b)(1) of the Act, 21 U.S.C. section 360bbb-3(b)(1), unless the authorization is terminated or revoked sooner.    Influenza A by PCR NEGATIVE NEGATIVE Final   Influenza B by PCR NEGATIVE NEGATIVE Final    Comment: (NOTE) The Xpert Xpress SARS-CoV-2/FLU/RSV assay is intended as an aid in  the diagnosis of influenza from Nasopharyngeal swab specimens and  should not be used as a sole basis for treatment. Nasal washings and  aspirates are unacceptable for Xpert Xpress SARS-CoV-2/FLU/RSV  testing. Fact Sheet for Patients: PinkCheek.be Fact Sheet for Healthcare Providers: GravelBags.it This test is not yet approved or cleared by the [place] FDA and  has been authorized for detection and/or diagnosis of SARS-CoV-2 by  FDA under an Emergency Use Authorization (EUA). This EUA will remain  in effect (meaning this test can be used) for the duration of the  Covid-19 declaration under Section 564(b)(1) of the  Act, 21  U.S.C. section 360bbb-3(b)(1), unless the authorization is  terminated or revoked. Performed at [facility], [address] [place], [place] [address]   Aerobic/Anaerobic Culture (surgical/deep wound)     Status: None   Collection Time: 01/28/20  1:06 PM   Specimen: PATH Soft tissue  Result Value Ref Range Status   Specimen Description   Final    TISSUE Performed at [facility], [address], [place], [place] [address]    Special Requests   Final    NONE Performed at [facility], [address] [place], [place] [address]    Gram Stain   Final    RARE WBC PRESENT,BOTH PMN AND MONONUCLEAR NO ORGANISMS SEEN Performed at [facility], [place] [address] [place], [place] [address]    Culture   Final    RARE ENTEROCOCCUS FAECIUM NO ANAEROBES ISOLATED VANCOMYCIN RESISTANT ENTEROCOCCUS ISOLATED    Report Status 02/03/2020 FINAL  Final   Organism ID, Bacteria ENTEROCOCCUS FAECIUM  Final      Susceptibility   Enterococcus faecium - MIC*    AMPICILLIN >=32 RESISTANT Resistant     VANCOMYCIN >=32 RESISTANT Resistant     GENTAMICIN SYNERGY SENSITIVE Sensitive     LINEZOLID 2 SENSITIVE Sensitive     * RARE ENTEROCOCCUS FAECIUM  Aerobic/Anaerobic Culture (surgical/deep wound)     Status: None   Collection Time: 01/28/20  1:54 PM   Specimen: Wound; Body Fluid  Result Value Ref Range Status   Specimen Description   Final    WOUND Performed at [facility], [address] [place], [place] [address]    Special Requests   Final    NONE Performed at [facility], [address] [place], [place] [address]    Gram Stain   Final    FEW WBC PRESENT, PREDOMINANTLY PMN NO ORGANISMS SEEN    Culture   Final    No growth aerobically or anaerobically. Performed at [facility], [place] [address], [place],  [address]    Report Status 02/02/2020 FINAL  Final  Aerobic/Anaerobic Culture (surgical/deep wound)      Status: None   Collection Time: 01/28/20  1:55 PM   Specimen: PATH Soft tissue  Result Value Ref Range Status   Specimen Description   Final    TISSUE Performed at [facility], [address], [place], [place] [address]    Special Requests   Final    NONE Performed at [facility], [address] [place], [place] [address]    Gram Stain   Final    RARE WBC PRESENT, PREDOMINANTLY PMN FEW GRAM POSITIVE COCCI IN PAIRS FEW GRAM VARIABLE ROD    Culture   Final    ABUNDANT ENTEROCOCCUS FAECIUM FEW CANDIDA ALBICANS FEW BACTEROIDES THETAIOTAOMICRON BETA LACTAMASE POSITIVE Performed at [facility], [place] [address] [place], [place] [address]    Report Status 02/01/2020 FINAL  Final   Organism ID, Bacteria ENTEROCOCCUS FAECIUM  Final      Susceptibility   Enterococcus faecium - MIC*    AMPICILLIN >=32 RESISTANT Resistant     VANCOMYCIN >=32 RESISTANT Resistant     GENTAMICIN SYNERGY SENSITIVE Sensitive     LINEZOLID 2 SENSITIVE Sensitive     * ABUNDANT ENTEROCOCCUS FAECIUM  Aerobic/Anaerobic Culture (surgical/deep wound)     Status: None   Collection Time: 01/28/20  2:17 PM   Specimen: PATH Bone biopsy; Tissue  Result Value Ref Range Status   Specimen Description   Final    BIOPSY Performed at [facility], [address] [place], [place] [address]    Special Requests   Final    NONE Performed at [facility], [place], [place] [address]    Gram Stain NO WBC SEEN NO ORGANISMS SEEN   Final   Culture   Final    RARE VANCOMYCIN RESISTANT ENTEROCOCCUS ISOLATED NO ANAEROBES ISOLATED Performed at [facility], [place] [address], [place], [place] [address]    Report Status 02/02/2020 FINAL  Final   Organism ID, Bacteria VANCOMYCIN RESISTANT ENTEROCOCCUS ISOLATED  Final      Susceptibility   Vancomycin resistant enterococcus isolated - MIC*    AMPICILLIN >=32 RESISTANT Resistant     VANCOMYCIN >=32 RESISTANT  Resistant     GENTAMICIN SYNERGY SENSITIVE Sensitive     LINEZOLID 2 SENSITIVE Sensitive     * RARE VANCOMYCIN RESISTANT ENTEROCOCCUS ISOLATED  MRSA PCR Screening     Status: None   Collection Time: 01/31/20  8:51 PM   Specimen: Nasal Mucosa; Nasopharyngeal  Result Value Ref Range Status   MRSA by PCR NEGATIVE NEGATIVE Final    Comment:        The GeneXpert MRSA Assay (FDA approved for NASAL specimens only), is one component of a comprehensive MRSA colonization surveillance program. It is not intended to diagnose MRSA infection nor to guide or monitor treatment for MRSA infections. Performed at [facility], [address] [place], [place] [address]   Aerobic/Anaerobic Culture (surgical/deep wound)     Status: None   Collection Time: 02/05/20 11:48 AM   Specimen: PATH Other; Tissue  Result Value Ref Range Status   Specimen Description TISSUE SACRAL  Final   Special Requests NONE  Final   Gram Stain   Final    RARE WBC PRESENT, PREDOMINANTLY PMN FEW GRAM POSITIVE COCCI IN PAIRS    Culture   Final    RARE VANCOMYCIN RESISTANT ENTEROCOCCUS ISOLATED NO ANAEROBES ISOLATED Performed at [facility], [place] [address] [place] [address], [place], [place] [address]    Report Status 02/10/2020 FINAL  Final   Organism ID, Bacteria VANCOMYCIN RESISTANT ENTEROCOCCUS ISOLATED  Final  Susceptibility   Vancomycin resistant enterococcus isolated - MIC*    AMPICILLIN >=32 RESISTANT Resistant     VANCOMYCIN >=32 RESISTANT Resistant     GENTAMICIN SYNERGY SENSITIVE Sensitive     LINEZOLID 2 SENSITIVE Sensitive     * RARE VANCOMYCIN RESISTANT ENTEROCOCCUS ISOLATED  Aerobic/Anaerobic Culture (surgical/deep wound)     Status: None   Collection Time: 02/05/20 11:48 AM   Specimen: PATH Other; Tissue  Result Value Ref Range Status   Specimen Description TISSUE  Final   Special Requests LEFT TROCHANTERIC  Final   Gram Stain   Final    RARE WBC PRESENT,BOTH PMN AND MONONUCLEAR FEW GRAM  POSITIVE COCCI IN PAIRS    Culture   Final    FEW VANCOMYCIN RESISTANT ENTEROCOCCUS ISOLATED NO ANAEROBES ISOLATED Performed at [facility], [address], [place], [place] [address]    Report Status 02/09/2020 FINAL  Final   Organism ID, Bacteria VANCOMYCIN RESISTANT ENTEROCOCCUS ISOLATED  Final      Susceptibility   Vancomycin resistant enterococcus isolated - MIC*    AMPICILLIN >=32 RESISTANT Resistant     VANCOMYCIN >=32 RESISTANT Resistant     GENTAMICIN SYNERGY SENSITIVE Sensitive     LINEZOLID 2 SENSITIVE Sensitive     * FEW VANCOMYCIN RESISTANT ENTEROCOCCUS ISOLATED  Culture, blood (Routine X 2) w Reflex to ID Panel     Status: None (Preliminary result)   Collection Time: 02/08/20 10:41 AM   Specimen: BLOOD  Result Value Ref Range Status   Specimen Description BLOOD BLOOD LEFT ARM  Final   Special Requests   Final    BOTTLES DRAWN AEROBIC AND ANAEROBIC Blood Culture adequate volume   Culture   Final    NO GROWTH 4 DAYS Performed at [facility], [address] [place], [place] [address]    Report Status PENDING  Incomplete  Culture, blood (Routine X 2) w Reflex to ID Panel     Status: None (Preliminary result)   Collection Time: 02/08/20 10:51 AM   Specimen: BLOOD  Result Value Ref Range Status   Specimen Description BLOOD BLOOD RIGHT HAND  Final   Special Requests   Final    BOTTLES DRAWN AEROBIC AND ANAEROBIC Blood Culture adequate volume   Culture   Final    NO GROWTH 4 DAYS Performed at [facility], [address] [place], [place] [address]    Report Status PENDING  Incomplete    Studies/Results: No results found.  Assessment/Plan: Dakwan Pridgen is a 74 y.o. male admitted March 6 with a fever from the nursing home.  He has a very complicated medical history.  He suffered Covid pneumonia in December and had a prolonged hospitalization including tracheostomy and PEG tube placement.  He was at a long-term acute care facility and  recently discharged to a skilled nursing facility.  He was sent to the hospital due to fever and found to have a large infected appearing decubitus ulcer.  CT scan showed the sacral decub which extended to the bone with a fluid collection suspicious for an abscess.  On admission temperature was 101.8 white count was 11.3.  He was taken to the operating room March 7 by Dr. [place] Abbot.  Extensive debridement was done of the sacral wound as well as the infected left hip and leg ulcer and the left ischial wound.  There was a sacral bone biopsy was done.  Wound VAC was placed.He is unable to provide me a history.  He  has extensive wounds down to bone. These are unlikely to heal unless his mobility status improves and nutritional status improves (alb 2.0) which seems unlikely at point. ESR 126, crp 9.6.  3/9 - s/p repeat surgery and site remains clean 3/10 no fevers, noted to have foreign body in pharnyx and ENT did scope for removal Cx with VRE, candida, possible anaerobe 3/17 dispo planning in place. dapto and linezolid cost prohibitive but there are no other options for VRE. 3/23 - some low grade fevers over weekend. Remains on dapt and unasyn and fluconazol.  CXR with stable bibasilar scarring.   Rec Cont Dapto and unasyn. No other alternatives to daptomycin except linezolid which apparently is also cost prohibitive to the SNF.  For underlying osteo will need 4-6 weeks. Will check esr and crp.  Fluconazole per GT for a week for the yeast..  Without plans to cover the wound with a muscle flap this wound is very unlikely to heal. Thank you very much for the consult. Will follow with you.  Leonel Ramsay   02/12/2020, 3:56 PM

## 2020-02-12 NOTE — Consult Note (Signed)
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  Recent surgical debridement and NPWT (VAC) therapy in place.  Assisted by OT.  Measurement:sacrum:  20 cm x 14 cm x 4 cm  LEft trochanter:  8 cm  X 6 cm x 4 cm  Wound HWT:[id number] 50% tendon and 50% pink and moist Sacrum:  90% red 10% pale pink nongranulating Drainage (amount, consistency, odor)  Periwound: Sacral wound with 1 cm x 1 cm x 0.2 cm denuded perimeter.  Protected with barrier ring Dressing procedure/placement/frequency: Cleanse wounds with NS and pat dry.  Apply 1 piece foam to sacral wound and 1 piece left trochanter with foam bridge. Barrier ring to coccyx.  Change M/W/F WOC team will follow.   Maple Hudson MSN, RN, FNP-BC CWON Wound, Ostomy, Continence Nurse Pager [phone]

## 2020-02-12 NOTE — TOC Progression Note (Signed)
Transition of Care [facility]) - Progression Note    Patient Details  Name: Kyle [name] MRN: [id number] Date of Birth: Dec 10, 1945  Transition of Care [facility] CM/SW Contact  Shawn Route, RN Phone Number: 02/12/2020, 2:37 PM  Clinical Narrative:      Unsuccessful attempts by [facility] and [facility] Leadership of patient.  Patient has complex medical therapies happening as well as requirement for IV Daptomycin, which Infectious Disease feels this is medically necessary for treatment.  Patient has been consulted by Palliative Care previously, MD has asked family if they would like to talk with them again.  Daughter reports at this time they still want to continue aggressive treatments.   Will continue to follow for further needs.    Expected Discharge Plan: Skilled Nursing Facility Barriers to Discharge: Continued Medical Work up  Expected Discharge Plan and Services Expected Discharge Plan: Skilled Nursing Facility                                               Social Determinants of Health (SDOH) Interventions    Readmission Risk Interventions No flowsheet data found.

## 2020-02-12 NOTE — Progress Notes (Signed)
PROGRESS NOTE    Kyle [name]  WIO:[id number] DOB: 1946-04-10 DOA: 01/27/2020  PCP: [facility]    LOS - 15   Brief Narrative:  Kyle [name] a 74 y.o.malewith medical history significant of COVID12/08/2019. CVA, prediabetes, PE around 2018 started on Xarelto, chronic tremors of bilateral hands, bipolar disorder, morbid obesity,CKD, HLD, neuropathy, chronic atrial fibrillation, DM2,COPD, and Parkinson disease.He presented withfever from SNF. He had been discharged from SNF after a 10 day inpatient stay for acute hypoxemic respiratory failure due to COVID-19. During this stay the patient was treated with Remdesivir, decadron, Rocephin and azithromycin.  In December patient was admitted for Covid with acute respiratory hypoxemic failure treated remdesivir 10 days of Decadron and Rocephin and azithromycin for possible bacterial superinfection in [place]. This was followed by a roughly three week stay at [facility]. This was followed by a three day stay at [facility].   His hospital stay in [place] was complicated by tachycardia patient was seen by cardiology started Cardizem metoprolol.The patient suffered respiratory arrest on 24 November 2019 and had to be intubated.He had an EEG showing slowing consistent with mild residual encephalopathy patient required tracheostomy and PEG tube placement. He later on developed pressure sacral decubitus ulcer which required debridement.He required broad-spectrum antibiotics IV vancomycin and Zosyn. He thentransferred to [name] in [place].His antibiotics were later simplified to cefepime. His trach was de cannulated.Patient remains nonverbal.In February he developed pneumonia worrisome for aspiration and was treated Unasyn.Hefailed trial of decannulation plan for him to have a ENT consult done to place an hour trach he had copious secretion requiring frequent suctioning as well as nasotracheal  suctioning.Patient was finally yesterday discharged from [facility] to SNF3/02/2020, but continued to be febrile.As soon as he presented to facility and was transferred to [facility] emergency department today.  The patient was admitted to a telemetry bed. General surgery and Infectious disease were consulted, and the patient went for operative I & D on 01/28/2020. Cultures were taken. He is receiving IV  The patient has been admitted to a telemetry bed. General surgery has been consulted and has taken the patient for operative I&D of the sacral wound on 01/28/2020. Cultures were taken. Infectious disease has also been consulted.The patient is receiving IV Zosyn and Vancomycin. Nutrition has been consulted and the patient has been restarted on tube feeds. SLP has been consulted for swallow eval, although this has not yet taken place as the patient has been in procedures. Cultures have grown enterococcus and possible anaerobes. Infectious disease feels that it will be unlikely that these wounds will heal given the patient's poor nutritional and mobility status. He has recommended palliative care consult.Returned to the OR again3/9, 3/11and 3/15 for further debridement.  Subjective 3/15: Pt was more awake today.  Denied pain, dyspnea.  No fever, N/V/D.   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 Lactic acid was 1.7 and procalcitonin  was 0.28. Initially this was felt to be due to pneumonia. However the patient had received treatment with Unasyn for pneumonia in February. CXR demonstrated persistent radiological evidence of pneumonia, but clinically the patient has no adventitious lung sounds, no cough, and he is saturating 98% on room air. However, CT does show evidence of Stage IV decubitus sacral ulcer with abscess and possible bone involvement. He has gone for multiple surgical debridements with general surgerythis admission.   Persistent intermittent fevers --CXR on 02/10/20 no acute finding --repeat blood cx neg  Stage IV sacral decubitus ulcerwith abscess and possible bony involvement.  S/P I & D with general surgeryx 3. The patient has a history of shrapnel, sowould not be a good candidate for MRI.CRP elevated at 9.7. ESR is 126.Albumin 2.1. Culture has grown outVanc resistantE. Faecium and possible anaerobe. --ID consulted, appreciate recommendations --unlikely sacral wound will heal with poor nutritional and functional status, palliative consulted --general surgery following PLAN: --ContinueonIV Unasyn and Daptomycin  --fluconazole 400 mg x1 week via G-tube, per ID --wound vac change MWF --palliative care following, patient will be followed by outpatient palliative at SNF after discharge --patient to be discharged with wound VAC --No further surgical procedure, per surgery --wound care, dressing changes, off loading, air mattress, and Wound Clinic follow up.  ?Recurrent aspiration pneumonia versus HCAP:resolved ENT was able to remove a large piece of retained material from vallecula on 3/11. --Speech evaluations, ongoing, can have ice chips --continue tube feeds by PEG tube  Dysphagia:mgmt as above  Hypernatremia: Resolved.  Napeaked to 153. Getting free waterflushes by PEG tube. --Free water flushes: 175 cc every 3 hours --Adjust free  water as needed to maintain normal sodium level  Hypertension: Chronic. Continue home medications.  Severe protein calorie malnutrition: Nutrition consult appreciated. This severely impacts the patient's ability to heal these wounds.  Anemia: Likely due to chronic disease.  Stable and no evidence of bleeding. --Monitor CBC  Chronic atrial fibrillation(HCC)-The patient has a CHADS2VASC score of >3.  Rate controlled. --continue eliquis  --Continue metoprolol and amiodarone   Type 2 Diabetes: Hb A1c is 4.2. Glucoses to be followed by FSBS and sensitive SSI.  --Oral antihyperglycemics have been held.  --carbohydrate controlled diet/tube feeds and hypoglycemic protocol.  Parkinson disease: Noted. Likely playing a part in patient's history of aspiration. --ContinueCogentin, Sinemet  Encephalopathy: Status post hypoxic respiratory arrest requiring intubation in the setting of Covid in December 2020.Patient with some minimal improvements per family started to respond to his name at times but mainly remains nonverbal bedbound incontinent status post PEG tube. Lurline Idol has been decannulated perLTAC notes if patient has significant secretions may need to replace tracheostomy. At this point appears to be stable.   DVT prophylaxis:SCD's Code Status: DNR Palliative care following.  Discussed with daughter during this admission myself, Family not ready for comfort care or hospice. Family Communication: Disposition Plan:To SNF pending bed offer.  Barrier currently is pt's daptomycin is too expensive (Linezolid on the expensive list as well), so SNF will not accept.  Due to Vanc resistantE. Faecium, no other abx option available.  ID has not finalized abx end dates yet.   Consultants:  General Surgery  Infectious Disease  Palliative Care  Procedures:  Surgical debridement of sacral decubitus ulcer x 2  Antimicrobials:  Unasyn,  Daptomycin  Objective: Vitals:   02/12/20 0336 02/12/20 0821 02/12/20 1140 02/12/20 1510  BP: (!) 151/68 (!) 152/80 137/70 (!) 154/74  Pulse: 73 80 72 74  Resp: 17 19  18 18  Temp: 98 F (36.7 C) 98.5 F (36.9 C) 98.7 F (37.1 C) 98.2 F (36.8 C)  TempSrc: Oral Oral Oral Oral  SpO2: 99% 99% 100% 100%  Height:        Intake/Output Summary (Last 24 hours) at 02/12/2020 1629 Last data filed at 02/12/2020 1418 Gross per 24 hour  Intake 1354.7 ml  Output 2250 ml  Net -895.3 ml   Filed Weights    Examination:  Constitutional: NAD, more awake today, had a few more exchanges HEENT: conjunctivae and lids normal, EOMI, mucosa dry CV: RRR no M,R,G. Distal pulses +2.  No cyanosis.   RESP: CTA B/L over anterior, normal respiratory effort  GI: +BS, NTND, tube feed present Extremities: No effusions, edema, or tenderness in BLE SKIN: warm. Neuro: II - XII grossly intact.  Wound vac draining serosanguinous fluid   Data Reviewed: I have personally reviewed following labs and imaging studies  CBC: Recent Labs  Lab 02/08/20 0808 02/09/20 0606 02/10/20 0445 02/11/20 0652 02/12/20 0736  WBC 7.7 10.9* 9.1 8.5 9.5  HGB 7.9* 8.0* 7.7* 7.4* 7.9*  HCT 25.1* 25.8* 25.4* 24.2* 25.6*  MCV 104.1* 104.9* 106.3* 105.2* 104.1*  PLT 287 328 322 342 161   Basic Metabolic Panel: Recent Labs  Lab 02/08/20 0808 02/09/20 0606 02/10/20 0445 02/11/20 0652 02/12/20 0736  NA 140 140 143 143 140  K 4.1 4.2 4.3 3.8 3.9  CL 105 106 111 109 108  CO2 _0 GLUCOSE 122* 137* 103* 118* 135*  BUN 13 24* <5* 24* 22  CREATININE 0.51* 0.49* 0.45* 0.45* 0.56*  CALCIUM 9.4 9.3 9.5 9.1 9.1  MG 2.6* 2.3 2.2 2.5* 2.3   GFR: Estimated Creatinine Clearance: 90.8 mL/min (A) (by C-G formula based on SCr of 0.56 mg/dL (L)). Liver Function Tests: No results for input(s): AST, ALT, ALKPHOS, BILITOT, PROT, ALBUMIN in the last 168 hours. No results for input(s): LIPASE, AMYLASE in the last 168  hours. No results for input(s): AMMONIA in the last 168 hours. Coagulation Profile: No results for input(s): INR, PROTIME in the last 168 hours. Cardiac Enzymes: Recent Labs  Lab 02/08/20 0808  CKTOTAL 60   BNP (last 3 results) No results for input(s): PROBNP in the last 8760 hours. HbA1C: No results for input(s): HGBA1C in the last 72 hours. CBG: Recent Labs  Lab 02/12/20 0005 02/12/20 0416 02/12/20 0823 02/12/20 1138 02/12/20 1602  GLUCAP 111* 115* 130* 121* 128*   Lipid Profile: No results for input(s): CHOL, HDL, LDLCALC, TRIG, CHOLHDL, LDLDIRECT in the last 72 hours. Thyroid Function Tests: No results for input(s): TSH, T4TOTAL, FREET4, T3FREE, THYROIDAB in the last 72 hours. Anemia Panel: No results for input(s): VITAMINB12, FOLATE, FERRITIN, TIBC, IRON, RETICCTPCT in the last 72 hours. Sepsis Labs: No results for input(s): PROCALCITON, LATICACIDVEN in the last 168 hours.  Recent Results (from the past 240 hour(s))  Aerobic/Anaerobic Culture (surgical/deep wound)     Status: None   Collection Time: 02/05/20 11:48 AM   Specimen: PATH Other; Tissue  Result Value Ref Range Status   Specimen Description TISSUE SACRAL  Final   Special Requests NONE  Final   Gram Stain   Final    RARE WBC PRESENT, PREDOMINANTLY PMN FEW GRAM POSITIVE COCCI IN PAIRS    Culture   Final    RARE VANCOMYCIN RESISTANT ENTEROCOCCUS ISOLATED NO ANAEROBES ISOLATED Performed at [facility], [address], [place], [place] [address]    Report  Status 02/10/2020 FINAL  Final   Organism ID, Bacteria VANCOMYCIN RESISTANT ENTEROCOCCUS ISOLATED  Final      Susceptibility   Vancomycin resistant enterococcus isolated - MIC*    AMPICILLIN >=32 RESISTANT Resistant     VANCOMYCIN >=32 RESISTANT Resistant     GENTAMICIN SYNERGY SENSITIVE Sensitive     LINEZOLID 2 SENSITIVE Sensitive     * RARE VANCOMYCIN RESISTANT ENTEROCOCCUS ISOLATED  Aerobic/Anaerobic Culture (surgical/deep wound)      Status: None   Collection Time: 02/05/20 11:48 AM   Specimen: PATH Other; Tissue  Result Value Ref Range Status   Specimen Description TISSUE  Final   Special Requests LEFT TROCHANTERIC  Final   Gram Stain   Final    RARE WBC PRESENT,BOTH PMN AND MONONUCLEAR FEW GRAM POSITIVE COCCI IN PAIRS    Culture   Final    FEW VANCOMYCIN RESISTANT ENTEROCOCCUS ISOLATED NO ANAEROBES ISOLATED Performed at [facility], [address], [place], [place] [address]    Report Status 02/09/2020 FINAL  Final   Organism ID, Bacteria VANCOMYCIN RESISTANT ENTEROCOCCUS ISOLATED  Final      Susceptibility   Vancomycin resistant enterococcus isolated - MIC*    AMPICILLIN >=32 RESISTANT Resistant     VANCOMYCIN >=32 RESISTANT Resistant     GENTAMICIN SYNERGY SENSITIVE Sensitive     LINEZOLID 2 SENSITIVE Sensitive     * FEW VANCOMYCIN RESISTANT ENTEROCOCCUS ISOLATED  Culture, blood (Routine X 2) w Reflex to ID Panel     Status: None (Preliminary result)   Collection Time: 02/08/20 10:41 AM   Specimen: BLOOD  Result Value Ref Range Status   Specimen Description BLOOD BLOOD LEFT ARM  Final   Special Requests   Final    BOTTLES DRAWN AEROBIC AND ANAEROBIC Blood Culture adequate volume   Culture   Final    NO GROWTH 4 DAYS Performed at [facility], [address], [place], [place] [address]    Report Status PENDING  Incomplete  Culture, blood (Routine X 2) w Reflex to ID Panel     Status: None (Preliminary result)   Collection Time: 02/08/20 10:51 AM   Specimen: BLOOD  Result Value Ref Range Status   Specimen Description BLOOD BLOOD RIGHT HAND  Final   Special Requests   Final    BOTTLES DRAWN AEROBIC AND ANAEROBIC Blood Culture adequate volume   Culture   Final    NO GROWTH 4 DAYS Performed at [facility], [address], [place], [place] [address]    Report Status PENDING  Incomplete         Radiology Studies: No results found.      Scheduled Meds: .  amiodarone  100 mg Per Tube BID  . apixaban  5 mg Per Tube BID  . benztropine  0.5 mg Per Tube BID  . carbidopa-levodopa  1 tablet Per Tube BID  . diltiazem  30 mg Per Tube QID  . famotidine  20 mg Per Tube Daily  . feeding supplement (PRO-STAT SUGAR FREE 64)  30 mL Per Tube BID  . fluconazole  400 mg Oral Daily  . free water  175 mL Per Tube Q3H  . hydrALAZINE  50 mg Per Tube Q8H  . insulin aspart  0-9 Units Subcutaneous Q4H  . metoprolol tartrate  12.5 mg Per Tube BID  . nutrition supplement (JUVEN)  1 packet Per Tube BID BM  . sodium chloride flush  10 mL Intravenous Q12H  . thiamine  200 mg Per Tube Daily   Continuous Infusions: . sodium chloride Stopped (02/12/20 1228)  . ampicillin-sulbactam (UNASYN) IV Stopped (02/12/20 1220)  . DAPTOmycin (CUBICIN)  IV Stopped (02/12/20 0053)  . feeding supplement (JEVITY 1.5 CAL/FIBER) 1,000 mL (02/12/20 0311)     LOS: 15 days    Enzo Bi, MD [facility]   If 7PM-7AM, please contact night-coverage [email or web address] 02/12/2020, 4:29 PM

## 2020-02-12 NOTE — Evaluation (Signed)
Occupational Therapy Re-Evaluation Patient Details Name: Kyle [name] MRN: [id number] DOB: Apr 18, 1946 Today's Date: 02/12/2020    History of Present Illness Kyle [name] is a 74 y.o. male with medical history significant of COVID 11/02/2019.  CVA, prediabetes, PE around 2018 started on Xarelto, chronic tremors of bilateral hands, bipolar disorder, morbid obesity CKD HLD neuropathy chronic atrial fibrillation DM2 COPD, Parkinson disease. He has been in LTAC and was discharged to SNF on 01/25/20. Was febrile when arrived to SNF and sent here to ED.   Clinical Impression   Pt seen for re-evaluation this date to update goals as appropriate and assess for progress. Pt does demo some increased vocalizations/communication throughout therapy this date including some independent thoughts separate from echolalia of OT's verbalizations. OT engages pt in lateral rolling with wound care nurse with MAX A +2 with pt demonstrating increased effort to place hand on bed rail this date with MOD/MAX hand over hand guidance. OT engages pt in oral care with swab and self feeding of individual ice chips with spoon with MAX A hand over hand and verbal cues to engage pt in using both hands to assist. Pt gradually becomes more successful with each task after OT assists in initiating. Extended time required for all aspects of re-evaluation and treatment. Pt left with bed alarm on, air mattress on turn setting, and with call bell in reach although pt could benefit from soft-touch call light. Anticipate SNF is still most appropriate d/c setting.    Follow Up Recommendations  SNF    Equipment Recommendations  Other (comment)(defer to next level of care)    Recommendations for Other Services       Precautions / Restrictions Precautions Precautions: Fall Precaution Comments: sacral wound and wound vac Restrictions Weight Bearing Restrictions: No Other Position/Activity Restrictions: feeding tube, NPO      Mobility  Bed Mobility Overal bed mobility: Needs Assistance Bed Mobility: Rolling Rolling: Max assist;+2 for physical assistance(increased initiation of placing hand on bed rail noted this date.)            Transfers                      Balance       Sitting balance - Comments: deferred                                   ADL either performed or assessed with clinical judgement   ADL Overall ADL's : Needs assistance/impaired     Grooming: Wash/dry face;Oral care;Moderate assistance;Maximal assistance;Cueing for sequencing;Cueing for compensatory techniques;Bed level Grooming Details (indicate cue type and reason): with HOB elevated. Cues to use B UEs to assist with performing hand to mouth with oral swab. Pt requires MOD/MAX hand over hand.                               General ADL Comments: Pt requires extended time and encouragement with all aspects of engaging in self care.     Vision Baseline Vision/History: Wears glasses Additional Comments: Pt noted to have decreased visual attention in R plane, tracks some on L side, diffficulty to formally assess vision d/t cognition     Perception     Praxis      Pertinent Vitals/Pain Pain Assessment: Faces Faces Pain Scale: Hurts little more Pain Location: some minimal grimacing with digit extension ROM  Pain Descriptors / Indicators: Grimacing Pain Intervention(s): Limited activity within patient's tolerance;Monitored during session     Hand Dominance     Extremity/Trunk Assessment Upper Extremity Assessment Upper Extremity Assessment: Generalized weakness;Difficult to assess due to impaired cognition RUE Deficits / Details: Pt with some AROM of digits (~10 degrees flex/ext with visual/tactile cues), shld flex AROM to ~15 degrees. LUE Deficits / Details: Pt with some AROM of digits (~15 degrees flex/ext with visual/tactile cues), limited reaching (shld flex/elbow ext) during fxl task, better  reachign with R UE.   Lower Extremity Assessment Lower Extremity Assessment: Defer to PT evaluation;Generalized weakness;Difficult to assess due to impaired cognition       Communication Communication Communication: Expressive difficulties(garbled, repetitive)   Cognition Arousal/Alertness: Awake/alert Behavior During Therapy: Flat affect Overall Cognitive Status: Within Functional Limits for tasks assessed                                 General Comments: Pt verbalizes 6-7 times during session, very soft spoken. Some more appropriate and engaged communication noted this session   General Comments       Exercises Other Exercises Other Exercises: OT facilitates education with pt re: importance of initiating UE ROM as he is able.   Shoulder Instructions      Home Living Family/patient expects to be discharged to:: Skilled nursing facility                                 Additional Comments: Patient unable to provide any prior function or living situation      Prior Functioning/Environment Level of Independence: Needs assistance                 OT Problem List: Decreased strength;Decreased range of motion;Decreased activity tolerance;Impaired balance (sitting and/or standing);Decreased coordination;Decreased cognition;Decreased safety awareness;Decreased knowledge of use of DME or AE;Decreased knowledge of precautions;Impaired tone;Impaired UE functional use;Increased edema      OT Treatment/Interventions: Self-care/ADL training;Therapeutic exercise;Neuromuscular education;Energy conservation;DME and/or AE instruction;Therapeutic activities;Cognitive remediation/compensation;Patient/family education;Balance training    OT Goals(Current goals can be found in the care plan section) Acute Rehab OT Goals Patient Stated Goal: none stated OT Goal Formulation: Patient unable to participate in goal setting Time For Goal Achievement:  02/26/20 Potential to Achieve Goals: Fair  OT Frequency: Min 1X/week   Barriers to D/C: Inaccessible home environment;Decreased caregiver support          Co-evaluation              AM-PAC OT "6 Clicks" Daily Activity     Outcome Measure Help from another person eating meals?: Total Help from another person taking care of personal grooming?: A Lot Help from another person toileting, which includes using toliet, bedpan, or urinal?: Total Help from another person bathing (including washing, rinsing, drying)?: Total Help from another person to put on and taking off regular upper body clothing?: Total Help from another person to put on and taking off regular lower body clothing?: Total 6 Click Score: 7   End of Session    Activity Tolerance: Patient tolerated treatment well Patient left: in bed;with call bell/phone within reach;with bed alarm set  OT Visit Diagnosis: Other abnormalities of gait and mobility (R26.89);Muscle weakness (generalized) (M62.81);Pain Pain - Right/Left: (B UE) Pain - part of body: Arm  Time: 4696-2952 OT Time Calculation (min): 53 min Charges:  OT General Charges $OT Visit: 1 Visit OT Evaluation $OT Re-eval: 1 Re-eval OT Treatments $Self Care/Home Management : 23-37 mins $Therapeutic Activity: 8-22 mins  Gerrianne Scale, MS, OTR/L ascom [phone] 02/12/20, 5:03 PM

## 2020-02-12 NOTE — Plan of Care (Addendum)
VSS. NSR on tele. Contact precautions maintained. Falls precautions and Q2 turns maintained. Total care pt. Assessment as documented. IV antibiotics continued per order. Tube feeds infusing @ 16mL/hr w/ ordered flush. Q4 BG checks maintained. No coverage needed overnight. Medications crushed and given via PEG tube. Tube flushes w/o difficulty. Pt tolerates well. Wound vac in place. Small amt of serosanguinous drainage present. Dressing reinforced post bm. External urinary catheter in place. Urine output clear yellow, amt adequate. Frequent oral care provided. POC reviewed, needs reinforcement.   Problem: Education: Goal: Knowledge of General Education information will improve Description: Including pain rating scale, medication(s)/side effects and non-pharmacologic comfort measures Outcome: Progressing   Problem: Health Behavior/Discharge Planning: Goal: Ability to manage health-related needs will improve Outcome: Progressing   Problem: Clinical Measurements: Goal: Ability to maintain clinical measurements within normal limits will improve Outcome: Progressing Goal: Will remain free from infection Outcome: Progressing Goal: Diagnostic test results will improve Outcome: Progressing Goal: Respiratory complications will improve Outcome: Progressing Goal: Cardiovascular complication will be avoided Outcome: Progressing   Problem: Activity: Goal: Risk for activity intolerance will decrease Outcome: Progressing   Problem: Nutrition: Goal: Adequate nutrition will be maintained Outcome: Progressing   Problem: Coping: Goal: Level of anxiety will decrease Outcome: Progressing   Problem: Elimination: Goal: Will not experience complications related to bowel motility Outcome: Progressing Goal: Will not experience complications related to urinary retention Outcome: Progressing   Problem: Pain Managment: Goal: General experience of comfort will improve Outcome: Progressing   Problem:  Safety: Goal: Ability to remain free from injury will improve Outcome: Progressing   Problem: Skin Integrity: Goal: Risk for impaired skin integrity will decrease Outcome: Progressing

## 2020-02-13 LAB — MAGNESIUM: Magnesium: 2.3 mg/dL (ref 1.7–2.4)

## 2020-02-13 LAB — BASIC METABOLIC PANEL
Anion gap: 9 (ref 5–15)
BUN: 25 mg/dL — ABNORMAL HIGH (ref 8–23)
CO2: 23 mmol/L (ref 22–32)
Calcium: 9.1 mg/dL (ref 8.9–10.3)
Chloride: 107 mmol/L (ref 98–111)
Creatinine, Ser: 0.34 mg/dL — ABNORMAL LOW (ref 0.61–1.24)
GFR calc Af Amer: >60 mL/min (ref >60)
GFR calc non Af Amer: >60 mL/min (ref >60)
Glucose, Bld: 138 mg/dL — ABNORMAL HIGH (ref 70–99)
Potassium: 3.9 mmol/L (ref 3.5–5.1)
Sodium: 139 mmol/L (ref 135–145)

## 2020-02-13 LAB — GLUCOSE, CAPILLARY
Glucose-Capillary: 135 mg/dL — ABNORMAL HIGH (ref 70–99)
Glucose-Capillary: 105 mg/dL — ABNORMAL HIGH (ref 70–99)
Glucose-Capillary: 148 mg/dL — ABNORMAL HIGH (ref 70–99)
Glucose-Capillary: 128 mg/dL — ABNORMAL HIGH (ref 70–99)
Glucose-Capillary: 127 mg/dL — ABNORMAL HIGH (ref 70–99)
Glucose-Capillary: 123 mg/dL — ABNORMAL HIGH (ref 70–99)

## 2020-02-13 LAB — CULTURE, BLOOD (ROUTINE X 2)
Culture: NO GROWTH
Culture: NO GROWTH
Special Requests: ADEQUATE
Special Requests: ADEQUATE

## 2020-02-13 LAB — CBC
HCT: 28.4 % — ABNORMAL LOW (ref 39.0–52.0)
Hemoglobin: 8.7 g/dL — ABNORMAL LOW (ref 13.0–17.0)
MCH: 31.8 pg (ref 26.0–34.0)
MCHC: 30.6 g/dL (ref 30.0–36.0)
MCV: 103.6 fL — ABNORMAL HIGH (ref 80.0–100.0)
Platelets: 382 10*3/uL (ref 150–400)
RBC: 2.74 MIL/uL — ABNORMAL LOW (ref 4.22–5.81)
RDW: 14.8 % (ref 11.5–15.5)
WBC: 9 10*3/uL (ref 4.0–10.5)
nRBC: 0.8 % — ABNORMAL HIGH (ref 0.0–0.2)

## 2020-02-13 LAB — C-REACTIVE PROTEIN: CRP: 5.1 mg/dL — ABNORMAL HIGH (ref <1.0)

## 2020-02-13 NOTE — Progress Notes (Addendum)
PROGRESS NOTE    Keiron Klos  MRN:[id number] DOB: 01/12/1946 DOA: 01/27/2020  PCP: [facility]    LOS - 16   Brief Narrative:  Kyle Websteris a 74 y.o.malewith medical history significant of COVID12/08/2019. CVA, prediabetes, PE around 2018 started on Xarelto, chronic tremors of bilateral hands, bipolar disorder, morbid obesity,CKD, HLD, neuropathy, chronic atrial fibrillation, DM2,COPD, and Parkinson disease.He presented withfever from SNF. He had been discharged from SNF after a 10 day inpatient stay for acute hypoxemic respiratory failure due to COVID-19. During this stay the patient was treated with Remdesivir, decadron, Rocephin and azithromycin.  In December patient was admitted for Covid with acute respiratory hypoxemic failure treated remdesivir 10 days of Decadron and Rocephin and azithromycin for possible bacterial superinfection in [place]. This was followed by a roughly three week stay at [facility]. This was followed by a three day stay at SNF.   His hospital stay in [place] was complicated by tachycardia patient was seen by cardiology started Cardizem metoprolol.The patient suffered respiratory arrest on 24 November 2019 and had to be intubated.He had an EEG showing slowing consistent with mild residual encephalopathy patient required tracheostomy and PEG tube placement. He later on developed pressure sacral decubitus ulcer which required debridement.He required broad-spectrum antibiotics IV vancomycin and Zosyn. He thentransferred to [facility] in [place].His antibiotics were later simplified to cefepime. His trach was de cannulated.Patient remains nonverbal.In February he developed pneumonia worrisome for aspiration and was treated Unasyn.Hefailed trial of decannulation plan for him to have a ENT consult done to place an hour trach he had copious secretion requiring frequent suctioning as well as nasotracheal  suctioning.Patient was finally discharged from LTAC to SNF3/02/2020, but continued to be febrile.As soon as he presented to facility and was transferred to [place] emergency department today.  The patient has been admitted to a telemetry bed. General surgery has been consulted and has taken the patient for operative I&D of the sacral wound on 01/28/2020. Cultures were taken. Infectious disease has also been consulted.  Returned to the OR again3/9, 3/11and 3/15 for further debridement.  Subjective 3/15: Pt was more awake today, with SLP at bedside for eval.  Denied pain, dyspnea.  No fever, N/V/D.   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 Lactic acid was 1.7 and procalcitonin was 0.28. Initially this was felt to be due to pneumonia. However the patient had received treatment with Unasyn for pneumonia in February. CXR demonstrated persistent radiological evidence of pneumonia, but clinically the patient has no adventitious lung sounds, no cough, and he is saturating 98% on room air. However, CT does show evidence of Stage IV decubitus sacral ulcer with abscess and possible bone involvement. He has gone for multiple surgical debridements with general surgerythis admission.   Persistent intermittent fevers, resolved --CXR on 02/10/20 no acute finding --repeat blood cx  neg  Stage IV sacral decubitus ulcerwith abscess and possible   bony involvement.  S/P I & D with general surgeryx 3. The patient has a history of shrapnel, sowould not be a good candidate for MRI.CRP elevated at 9.7. ESR is 126.Albumin 2.1. Culture has grown outVanc resistantE. Faecium and possible anaerobe. --ID consulted, appreciate recommendations --unlikely sacral wound will heal with poor nutritional and functional status, palliative consulted --general surgery following PLAN: --ContinueonIV Unasyn and Daptomycin  --fluconazole 400 mg x1 week via G-tube, per ID, last day 3/24. --wound vac change MWF --palliative care following, patient will be followed by outpatient palliative at SNF after discharge --patient to be discharged with wound VAC --No further surgical procedure, per surgery --wound care, dressing changes, off loading, air mattress, and Wound Clinic follow up.  ?Recurrent aspiration pneumonia versus HCAP:resolved ENT was able to remove a large piece of retained material from vallecula on 3/11. --Speech evaluations, ongoing, can have ice chips --continue tube feeds by PEG tube  Dysphagia:mgmt as above  Hypernatremia: Resolved.  Napeaked to 153. Getting free waterflushes by PEG tube. --Free water flushes: 175 cc every 3 hours --Adjust free water as needed to maintain normal sodium level  Hypertension: Chronic. Continue home medications.  Severe protein calorie malnutrition: Nutrition consult appreciated. This severely impacts the patient's ability to heal these wounds.  Anemia: Likely due to chronic disease.  Stable and no evidence of bleeding. --Monitor CBC  Chronic atrial fibrillation(HCC)-The patient has a CHADS2VASC score of >3.  Rate controlled. --continue eliquis  --Continue metoprolol and amiodarone   Type 2 Diabetes:  Hb A1c is 4.2.  --Oral antihyperglycemics have been held.  --carbohydrate controlled diet/tube feeds and  hypoglycemic protocol. --d/c fingersticks and SSI since pt has needed very little insulin thus far  Parkinson disease: Noted. Likely playing a part in patient's history of aspiration. --ContinueCogentin, Sinemet  Encephalopathy: Status post hypoxic respiratory arrest requiring intubation in the setting of Covid in December 2020.Patient with some minimal improvements per family started to respond to his name at times but mainly remains nonverbal bedbound incontinent status post PEG tube. Lurline Idol has been decannulated perLTAC notes if patient has significant secretions may need to replace tracheostomy. At this point appears to be stable.   DVT prophylaxis:SCD's Code Status: DNR Palliative care following.  Discussed with daughter during this admission myself, Family not ready for comfort care or hospice. Family Communication: Disposition Plan:To SNF pending bed offer.  Barrier currently is pt's daptomycin is too expensive (Linezolid on the expensive list as well), so SNF will not accept.  Due to Vanc resistantE. Faecium, no other abx option available.  ID has not finalized abx end dates yet.   Consultants:  General Surgery  Infectious Disease  Palliative Care  Procedures:  Surgical debridement of sacral decubitus ulcer x 2  Antimicrobials:  Unasyn, Daptomycin  Objective: Vitals:   02/13/20 0811 02/13/20 1025 02/13/20 1437 02/13/20 1810  BP: (!) 147/72 (!) 151/75 (!) 152/72 (!) 142/85  Pulse: 68 75 79 82  Resp: _0 Temp: 98 F (36.7 C) 97.7 F (36.5 C) 97.6 F (36.4 C)   TempSrc:  Axillary Axillary   SpO2: 99% 100% 99%   Height:        Intake/Output Summary (Last 24 hours) at 02/13/2020 1927 Last data filed at 02/13/2020 1900 Gross per 24 hour  Intake 1344.45 ml  Output 1910 ml  Net -565.55 ml   Filed Weights    Examination:  Constitutional: NAD, more awake today, said a few complete sentences HEENT: conjunctivae and lids normal,  EOMI, mucosa dry CV: RRR no M,R,G. Distal pulses +2.  No cyanosis.   RESP: CTA B/L over anterior, normal respiratory effort  GI: +BS, NTND, tube feed present Extremities: No effusions, edema, or tenderness in BLE SKIN: warm. Neuro: II - XII grossly intact.  Wound vac draining serosanguinous fluid   Data Reviewed: I have personally reviewed following labs and imaging studies  CBC: Recent Labs  Lab 02/09/20 0606 02/10/20 0445 02/11/20 0652 02/12/20 0736 02/13/20 0436  WBC 10.9* 9.1 8.5 9.5 9.0  HGB 8.0* 7.7* 7.4* 7.9* 8.7*  HCT 25.8* 25.4* 24.2* 25.6* 28.4*  MCV 104.9* 106.3* 105.2* 104.1* 103.6*  PLT 328 322 342 363 382   Basic Metabolic Panel: Recent Labs  Lab 02/09/20 0606 02/10/20 0445 02/11/20 0652 02/12/20 0736 02/13/20 0436  NA 140 143 143 140 139  K 4.2 4.3 3.8 3.9 3.9  CL 106 111 109 108 107  CO2 22 24 23 23 23  GLUCOSE 137* 103* 118* 135* 138*  BUN 24* <5* 24* 22 25*  CREATININE 0.49* 0.45* 0.45* 0.56* 0.34*  CALCIUM 9.3 9.5 9.1 9.1 9.1  MG 2.3 2.2 2.5* 2.3 2.3   GFR: Estimated Creatinine Clearance: 90.8 mL/min (A) (by C-G formula based on SCr of 0.34 mg/dL (L)). Liver Function Tests: No results for input(s): AST, ALT, ALKPHOS, BILITOT, PROT, ALBUMIN in the last 168 hours. No results for input(s): LIPASE, AMYLASE in the last 168 hours. No results for input(s): AMMONIA in the last 168 hours. Coagulation Profile: No results for input(s): INR, PROTIME in the last 168 hours. Cardiac Enzymes: Recent Labs  Lab 02/08/20 0808  CKTOTAL 60   BNP (last 3 results) No results for input(s): PROBNP in the last 8760 hours. HbA1C: No results for input(s): HGBA1C in the last 72 hours. CBG: Recent Labs  Lab 02/13/20 0436 02/13/20 0733 02/13/20 1213 02/13/20 1521 02/13/20 1653  GLUCAP 135* 105* 148* 128* 127*   Lipid Profile: No results for input(s): CHOL, HDL, LDLCALC, TRIG, CHOLHDL, LDLDIRECT in the last 72 hours. Thyroid Function Tests: No results for  input(s): TSH, T4TOTAL, FREET4, T3FREE, THYROIDAB in the last 72 hours. Anemia Panel: No results for input(s): VITAMINB12, FOLATE, FERRITIN, TIBC, IRON, RETICCTPCT in the last 72 hours. Sepsis Labs: No results for input(s): PROCALCITON, LATICACIDVEN in the last 168 hours.  Recent Results (from the past 240 hour(s))  Aerobic/Anaerobic Culture (surgical/deep wound)     Status: None   Collection Time: 02/05/20 11:48 AM   Specimen: PATH Other; Tissue  Result Value Ref Range Status   Specimen Description TISSUE SACRAL  Final   Special Requests NONE  Final   Gram Stain   Final    RARE WBC PRESENT, PREDOMINANTLY PMN FEW GRAM POSITIVE COCCI IN PAIRS    Culture   Final    RARE VANCOMYCIN RESISTANT ENTEROCOCCUS ISOLATED NO ANAEROBES ISOLATED Performed at [facility], [address] [place], [place] [address]    Report Status 02/10/2020 FINAL  Final   Organism ID, Bacteria VANCOMYCIN RESISTANT ENTEROCOCCUS ISOLATED  Final      Susceptibility   Vancomycin resistant enterococcus isolated - MIC*    AMPICILLIN >=32 RESISTANT Resistant     VANCOMYCIN >=32 RESISTANT Resistant     GENTAMICIN SYNERGY SENSITIVE Sensitive     LINEZOLID 2 SENSITIVE Sensitive     * RARE VANCOMYCIN RESISTANT ENTEROCOCCUS ISOLATED  Aerobic/Anaerobic Culture (surgical/deep wound)     Status: None   Collection Time: 02/05/20 11:48 AM   Specimen: PATH Other; Tissue    Result Value Ref Range Status   Specimen Description TISSUE  Final   Special Requests LEFT TROCHANTERIC  Final   Gram Stain   Final    RARE WBC PRESENT,BOTH PMN AND MONONUCLEAR FEW GRAM POSITIVE COCCI IN PAIRS    Culture   Final    FEW VANCOMYCIN RESISTANT ENTEROCOCCUS ISOLATED NO ANAEROBES ISOLATED Performed at [facility], [address], [place], [place] [address]    Report Status 02/09/2020 FINAL  Final   Organism ID, Bacteria VANCOMYCIN RESISTANT ENTEROCOCCUS ISOLATED  Final      Susceptibility   Vancomycin resistant enterococcus  isolated - MIC*    AMPICILLIN >=32 RESISTANT Resistant     VANCOMYCIN >=32 RESISTANT Resistant     GENTAMICIN SYNERGY SENSITIVE Sensitive     LINEZOLID 2 SENSITIVE Sensitive     * FEW VANCOMYCIN RESISTANT ENTEROCOCCUS ISOLATED  Culture, blood (Routine X 2) w Reflex to ID Panel     Status: None   Collection Time: 02/08/20 10:41 AM   Specimen: BLOOD  Result Value Ref Range Status   Specimen Description BLOOD BLOOD LEFT ARM  Final   Special Requests   Final    BOTTLES DRAWN AEROBIC AND ANAEROBIC Blood Culture adequate volume   Culture   Final    NO GROWTH 5 DAYS Performed at [facility], [address], [place], [place] [address]    Report Status 02/13/2020 FINAL  Final  Culture, blood (Routine X 2) w Reflex to ID Panel     Status: None   Collection Time: 02/08/20 10:51 AM   Specimen: BLOOD  Result Value Ref Range Status   Specimen Description BLOOD BLOOD RIGHT HAND  Final   Special Requests   Final    BOTTLES DRAWN AEROBIC AND ANAEROBIC Blood Culture adequate volume   Culture   Final    NO GROWTH 5 DAYS Performed at [facility], [address] [place], [place] [address]    Report Status 02/13/2020 FINAL  Final         Radiology Studies: No results found.      Scheduled Meds: . amiodarone  100 mg Per Tube BID  . apixaban  5 mg Per Tube BID  . benztropine  0.5 mg Per Tube BID  . carbidopa-levodopa  1 tablet Per Tube BID  . diltiazem  30 mg Per Tube QID  . famotidine  20 mg Per Tube Daily  . feeding supplement (PRO-STAT SUGAR FREE 64)  30 mL Per Tube BID  . fluconazole  400 mg Oral Daily  . free water  175 mL Per Tube Q3H  . hydrALAZINE  50 mg Per Tube Q8H  . insulin aspart  0-9 Units Subcutaneous Q4H  . metoprolol tartrate  12.5 mg Per Tube BID  . nutrition supplement (JUVEN)  1 packet Per Tube BID BM  . sodium chloride flush  10 mL Intravenous Q12H  . thiamine  200 mg Per Tube Daily   Continuous Infusions: . sodium chloride Stopped  (02/12/20 1228)  . ampicillin-sulbactam (UNASYN) IV 3 g (02/13/20 1829)  . DAPTOmycin (CUBICIN)  IV Stopped (02/12/20 2055)  . feeding supplement (JEVITY 1.5 CAL/FIBER) 1,000 mL (02/13/20 1459)     LOS: 16 days    Enzo Bi, MD [facility]   If 7PM-7AM, please contact night-coverage [email or web address] 02/13/2020, 7:27 PM

## 2020-02-13 NOTE — Progress Notes (Signed)
Patient resting in bed without any complaints.

## 2020-02-13 NOTE — Progress Notes (Signed)
All medications administered for this patient by Juluis Rainier Student Nurse was supervised by this RN  Betty Daidone A Shun Pletz

## 2020-02-13 NOTE — Progress Notes (Signed)
Speech Language Pathology Treatment: Dysphagia  Patient Details Name: Kyle [name] MRN: [id number] DOB: July 15, 1946 Today's Date: 02/13/2020 Time: 1530-1630 SLP Time Calculation (min) (ACUTE ONLY): 60 min  Assessment / Plan / Recommendation Clinical Impression  Pt seen today for ongoing assessment of swallowing; dysphagia tx targeting stimulation of swallowing and therapeutic po trials w/ ST services per results of recent MBSS completed. Continued discussion w/ NSG and pt about the importance of frequent oral care for hygiene and stimulation of swallowing. Pt awakened easily,he greeted this SLP.He was verbal and answered SLP's basic y/n questions w/ additional few words/comments. Noted slower processing and verbal responses, but they were accurate. Pt required full feeding assistance; positioning upright assistance. Reviewed chart notes; previous ENT noted re: assessment "was able to assess and remove via direct view and Glide Scope alarge mass of material was noted inpt's hypopharynx/Valleculae yesterday". Pt verbalized it felt "better" in his throat now. Noted continued involuntary lingual movements at rest. Upon attending to presented oral care and/or single ice chips, noted the lingual movements calmed. Post thorough oral careand moistening of mouth w/ oral gel,several single ice chips were given. Then trials of Puree, and Nectar consistency liquids via TSP. Strict supportive feeding strategies given readying the pt for each trial given. Noted pt's immediate mastication of each chip and appropriate oral management of each trial. Pt appeared to maintain oral control of the trials; no anterior loss. Pt demonstrated fairly timelypharyngeal response/swallow w/ each trial; laryngeal excursion palpated. No overt, clinical s/s of aspiration noted - no decline in respiratory effort, no wet vocal quality b/t trials when assessed, no wet respirations. Pt stated the po trials were "good". He endorsed  wanting "more"tomorrow; wants to try ice cream -- discussed Magic Cup option. Pt's engagement w/ SLP was felt to be appropriate w/ pt exhibiting follow through w/ basic instructions.He seemed pleased w/ his oral intake. Niece was present during session and thorough education was given to both pt and Niece on the session; feeding of oral intake to include: education on dysphagia, aspiration precautions, swallowing strategies implemented, food/liquid consistency, importance of oral care to avoid drying oropharyngeal secretions again. NSG updated on the need for oral care as well -- pt's mouth/tongue was dry, rough to touch. Recommend continue w/ frequent oral care for hygiene and stimulation of swallowing.Recommend Pleasure po SINGLEice chips w/ NSG staff post thorough oral care and positioning upright; aspiration precautions. Therapeutic po trials of Puree and Nectar consistency liquids w/ ST services. NSG supervision for safe, oral intake of ice chips during the weekend.Education posted in room re: oral careand ice chips.It will be vital that skilled Dysphagia therapy and therapeutic po trials continue at discharge to SNF -- the need to establish a structured feeding plan for po trials to be given 3-4x daily w/ NSG and/or family (post thorough family education) in order to meet pt's quality of life goals and protect and preserve pt's oropharyngeal swallow function. MD/NSG updated. ST services will f/unext 1-2 days for ongoing assessment of oropharyngeal swallow function through therapeutic po trials.     HPI HPI: Pt is a 74 y.o. male with medical history significant of Respiratory Arrest Jan. 1, 2021 requirig tracheostomy and PEG tube placement, (now decannulated); NPO status since Jan. 1, 2021; Covid 11/02/2019; CVA; prediabetes; PE around 2018 started on Xarelto; chronic tremors of bilateral hands; Bipolar Disorder; morbid Obesity; CKD; HLD; neuropathy; chronic atrial fibrillation; DM2; COPD;  Parkinson disease. He has been in [facility] since Jan. 2021, and was discharged to [facility]  on 01/25/2020. Was febrile when arrived to SNF and sent here to this ED on 01/27/2020.  Per pt's Sister, pt has not received skilled ST services for Dysphagia prior to this admission.  MBSS x2 this admission during ST therapy services.       SLP Plan  Continue with current plan of care       Recommendations  Diet recommendations: NPO(ice chips w/ NSG; therapeutic trials w/ ST ) Liquids provided via: Teaspoon Medication Administration: Via alternative means(PEG) Supervision: Full supervision/cueing for compensatory strategies;Staff to assist with self feeding Compensations: Minimize environmental distractions;Slow rate;Small sips/bites Postural Changes and/or Swallow Maneuvers: Seated upright 90 degrees;Upright 30-60 min after meal                General recommendations: (Dietician f/u) Oral Care Recommendations: Oral care QID;Oral care before and after PO;Staff/trained caregiver to provide oral care;Oral care prior to ice chip/H20 Follow up Recommendations: Skilled Nursing facility SLP Visit Diagnosis: Dysphagia, oropharyngeal phase (R13.12);Cognitive communication deficit (G68.159) Plan: Continue with current plan of care       GO                 [name], MS, CCC-SLP [name] 02/13/2020, 5:13 PM

## 2020-02-13 NOTE — Plan of Care (Signed)

## 2020-02-14 ENCOUNTER — Inpatient Hospital Stay: Payer: Self-pay

## 2020-02-14 LAB — GLUCOSE, CAPILLARY
Glucose-Capillary: 106 mg/dL — ABNORMAL HIGH (ref 70–99)
Glucose-Capillary: 120 mg/dL — ABNORMAL HIGH (ref 70–99)
Glucose-Capillary: 128 mg/dL — ABNORMAL HIGH (ref 70–99)
Glucose-Capillary: 128 mg/dL — ABNORMAL HIGH (ref 70–99)
Glucose-Capillary: 136 mg/dL — ABNORMAL HIGH (ref 70–99)

## 2020-02-14 LAB — BASIC METABOLIC PANEL
Anion gap: 8 (ref 5–15)
BUN: 27 mg/dL — ABNORMAL HIGH (ref 8–23)
CO2: 24 mmol/L (ref 22–32)
Calcium: 9.2 mg/dL (ref 8.9–10.3)
Chloride: 106 mmol/L (ref 98–111)
Creatinine, Ser: 0.36 mg/dL — ABNORMAL LOW (ref 0.61–1.24)
GFR calc Af Amer: >60 mL/min (ref >60)
GFR calc non Af Amer: >60 mL/min (ref >60)
Glucose, Bld: 108 mg/dL — ABNORMAL HIGH (ref 70–99)
Potassium: 4.2 mmol/L (ref 3.5–5.1)
Sodium: 138 mmol/L (ref 135–145)

## 2020-02-14 LAB — SEDIMENTATION RATE: Sed Rate: 127 mm/hr — ABNORMAL HIGH (ref 0–20)

## 2020-02-14 LAB — CBC
HCT: 27.5 % — ABNORMAL LOW (ref 39.0–52.0)
Hemoglobin: 8.5 g/dL — ABNORMAL LOW (ref 13.0–17.0)
MCH: 31.7 pg (ref 26.0–34.0)
MCHC: 30.9 g/dL (ref 30.0–36.0)
MCV: 102.6 fL — ABNORMAL HIGH (ref 80.0–100.0)
Platelets: 438 10*3/uL — ABNORMAL HIGH (ref 150–400)
RBC: 2.68 MIL/uL — ABNORMAL LOW (ref 4.22–5.81)
RDW: 15.1 % (ref 11.5–15.5)
WBC: 8.8 10*3/uL (ref 4.0–10.5)
nRBC: 0.6 % — ABNORMAL HIGH (ref 0.0–0.2)

## 2020-02-14 LAB — MAGNESIUM: Magnesium: 2.4 mg/dL (ref 1.7–2.4)

## 2020-02-14 MED ORDER — FLUCONAZOLE 40 MG/ML PO SUSR
400.0000 mg | Freq: Once | ORAL | Status: DC
  Filled 2020-02-14: qty 10

## 2020-02-14 MED ORDER — SODIUM CHLORIDE 0.9% FLUSH: 10.0000 mL | INTRAVENOUS | Status: DC | PRN

## 2020-02-14 MED ORDER — FREE WATER
200.0000 mL | Status: DC
  Administered 2020-02-14 – 2020-02-15 (×6): 200 mL

## 2020-02-14 MED ORDER — CHLORHEXIDINE GLUCONATE CLOTH 2 % EX PADS
6.0000 | MEDICATED_PAD | Freq: Every day | CUTANEOUS | Status: DC
  Administered 2020-02-14: 6 via TOPICAL

## 2020-02-14 NOTE — Progress Notes (Addendum)
Peripherally Inserted Central Catheter Placement  The IV Nurse has discussed with the patient and/or persons authorized to consent for the patient, the purpose of this procedure and the potential benefits and risks involved with this procedure.  The benefits include less needle sticks, lab draws from the catheter, and the patient may be discharged home with the catheter. Risks include, but not limited to, infection, bleeding, blood clot (thrombus formation), and puncture of an artery; nerve damage and irregular heartbeat and possibility to perform a PICC exchange if needed/ordered by physician.  Alternatives to this procedure were also discussed.  Bard Power PICC patient education guide, fact sheet on infection prevention and patient information card has been provided to patient /or left at bedside.    PICC Placement Documentation  PICC Single Lumen 02/14/20 PICC Left Brachial 45 cm 0 cm (Active)  Indication for Insertion or Continuance of Line Home intravenous therapies (PICC only) 02/14/20 1850  Exposed Catheter (cm) 0 cm 02/14/20 1850  Site Assessment Clean;Dry;Intact 02/14/20 1850  Line Status Flushed;Saline locked;Blood return noted 02/14/20 1850  Dressing Type Transparent;Securing device 02/14/20 1850  Dressing Status Clean;Dry;Intact;Antimicrobial disc in place 02/14/20 1850  Dressing Intervention New dressing 02/14/20 1850  Dressing Change Due 02/21/20 02/14/20 1850    Consent signed by sister, [name] via telephone.   Annett Fabian 02/14/2020, 7:26 PM

## 2020-02-14 NOTE — Progress Notes (Signed)
PHARMACY CONSULT NOTE FOR:  OUTPATIENT  PARENTERAL ANTIBIOTIC THERAPY (OPAT)  Indication: Sacral wounds and osteomyelitis Regimen: Ceftriaxone 2gm IV q24h End date: 03/10/2020  Per ID, will also need doxycycline 100mg  PO BID to 03/10/2020 and metronidazole 500mg  PO TID until 02/28/2020  IV antibiotic discharge orders are pended. To discharging provider:  please sign these orders via discharge navigator,  Select New Orders & click on the button choice - Manage This Unsigned Work.     Thank you for allowing pharmacy to be a part of this patient's care.  , PharmD, BCPS.   Work Cell: [phone] 02/14/2020 3:34 PM

## 2020-02-14 NOTE — Progress Notes (Signed)
Physical Therapy Treatment Patient Details Name: Kyle [name] MRN: [id number] DOB: 1946/05/26 Today's Date: 02/14/2020    History of Present Illness Kyle [name] is a 74 y.o. male with medical history significant of COVID 11/02/2019.  CVA, prediabetes, PE around 2018 started on Xarelto, chronic tremors of bilateral hands, bipolar disorder, morbid obesity CKD HLD neuropathy chronic atrial fibrillation DM2 COPD, Parkinson disease. He has been in [place] and was discharged to SNF on 01/25/20. Was febrile when arrived to SNF and sent here to ED.    PT Comments    Pt seen this date for PT assessment and goal update. Due to patient's limited participation and profound weakness, goals downgraded. However, the patient was willing to initiate all movements, alert throughout the session, and oriented to self. The patient was able to move both LE on command in a very limited range of motion, AAROM to complete movement. Pt able to reach with L hand to squeeze therapists hand, and squeeze with R hand as well. Bed mobility performed in preparation for transfer to sitting, rolled R and L several times with maxAx2. Pt able to initiate movements and use grab bars, maintained sidelying for several minutes each side with CGA/minA. Further mobility deferred due to wound vac concerns, RN in room to assist and assess (corner of wound vac application lifted away from wound). Due to pt's ability to attempt to participate, current recommendation remains appropriate.     Follow Up Recommendations  SNF     Equipment Recommendations  Other (comment)(TBD)    Recommendations for Other Services       Precautions / Restrictions Precautions Precautions: Fall Precaution Comments: sacral wound and wound vac Restrictions Weight Bearing Restrictions: No Other Position/Activity Restrictions: feeding tube, NPO    Mobility  Bed Mobility Overal bed mobility: Needs Assistance Bed Mobility: Rolling Rolling: Max assist;+2 for  physical assistance            Transfers                 General transfer comment: unsafe to attempt during this session due to difficulties with wound vac, RN in room and aware of wound vac concerns  Ambulation/Gait                 Stairs             Wheelchair Mobility    Modified Rankin (Stroke Patients Only)       Balance                                            Cognition Arousal/Alertness: Awake/alert Behavior During Therapy: Flat affect Overall Cognitive Status: Within Functional Limits for tasks assessed                                 General Comments: Pt oriented to self, incorrect birth year stated. Pt thought he was in [place], able to staye "maybe" when providing with location choices about being in a hospital. Disoriented to year.      Exercises Other Exercises Other Exercises: extended time spent with bed mobility to encourage pt participation, spent several minutes in R and L sidelying to allow for cleaning after BM, and linen changes. The patient held until bed rails and was a CGA-minA to maintain position once position was achieved.  General Comments        Pertinent Vitals/Pain      Home Living                      Prior Function            PT Goals (current goals can now be found in the care plan section) Acute Rehab PT Goals PT Goal Formulation: Patient unable to participate in goal setting Time For Goal Achievement: 02/28/20 Progress towards PT goals: Goals downgraded-see care plan    Frequency    Min 2X/week      PT Plan Current plan remains appropriate    Co-evaluation PT/OT/SLP Co-Evaluation/Treatment: Yes Reason for Co-Treatment: Complexity of the patient's impairments (multi-system involvement);For patient/therapist safety;Necessary to address cognition/behavior during functional activity PT goals addressed during session: Mobility/safety with  mobility;Strengthening/ROM OT goals addressed during session: ADL's and self-care      AM-PAC PT "6 Clicks" Mobility   Outcome Measure  Help needed turning from your back to your side while in a flat bed without using bedrails?: Total Help needed moving from lying on your back to sitting on the side of a flat bed without using bedrails?: Total Help needed moving to and from a bed to a chair (including a wheelchair)?: Total Help needed standing up from a chair using your arms (e.g., wheelchair or bedside chair)?: Total Help needed to walk in hospital room?: Total Help needed climbing 3-5 steps with a railing? : Total 6 Click Score: 6    End of Session   Activity Tolerance: Patient tolerated treatment well;Other (comment)(treatment limited by wound vac edge lifting/unattached, RN aware.) Patient left: in bed;with call bell/phone within reach;with bed alarm set Nurse Communication: Mobility status PT Visit Diagnosis: Other abnormalities of gait and mobility (R26.89);Muscle weakness (generalized) (M62.81)     Time: 6203-5597 PT Time Calculation (min) (ACUTE ONLY): 37 min  Charges:  $Therapeutic Activity: 8-22 mins                     Olga Coaster PT, DPT 11:54 AM,02/14/20

## 2020-02-14 NOTE — Consult Note (Signed)
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  Recent surgical debridement and NPWT (VAC) therapy in place.  Assisted by bedside RN today.   Wound VKF:MMCRFVOHKG 50% tendon and 50% pink and moist Sacrum:  90% red 10% pale pink nongranulating Drainage (amount, consistency, odor)  Periwound: Sacral wound with 1 cm x 1 cm x 0.2 cm denuded perimeter.  Protected with barrier ring Dressing procedure/placement/frequency: Cleanse wounds with NS and pat dry.  Apply 1 piece foam to sacral wound and 1 piece left trochanter with foam bridge. Barrier ring to coccyx.  Change M/W/F WOC team will follow.   Kyle Hudson MSN, RN, FNP-BC CWON Wound, Ostomy, Continence Nurse Pager [phone]

## 2020-02-14 NOTE — Progress Notes (Signed)
PROGRESS NOTE    Kyle [name]  VZC:[id number] DOB: 06-Oct-1946 DOA: 01/27/2020  PCP: [facility]    LOS - 17   Brief Narrative:  Kyle [name] a 74 y.o.malewith medical history significant of COVID12/08/2019. CVA, prediabetes, PE around 2018 started on Xarelto, chronic tremors of bilateral hands, bipolar disorder, morbid obesity,CKD, HLD, neuropathy, chronic atrial fibrillation, DM2,COPD, and Parkinson disease.He presented withfever from SNF. He had been discharged from SNF after a 10 day inpatient stay for acute hypoxemic respiratory failure due to COVID-19. During this stay the patient was treated with Remdesivir, decadron, Rocephin and azithromycin. In December patient was admitted for Covid with acute respiratory hypoxemic failure treated remdesivir 10 days of Decadron and Rocephin and azithromycin for possible bacterial superinfection in [place]. This was followed by a roughly three week stay at [facility]. This was followed by a three day stay at [facility].   His hospital stay in [place] was complicated by tachycardia patient was seen by cardiology started Cardizem metoprolol.The patient suffered respiratory arrest on 24 November 2019 and had to be intubated.He had an EEG showing slowing consistent with mild residual encephalopathy patient required tracheostomy and PEG tube placement. He later on developed pressure sacral decubitus ulcer which required debridement.He required broad-spectrum antibiotics IV vancomycin and Zosyn. He thentransferred to [name] in [place].His antibiotics were later simplified to cefepime. His trach was de cannulated.Patient remains nonverbal.In February he developed pneumonia worrisome for aspiration and was treated Unasyn.Hefailed trial of decannulation plan for him to have a ENT consult done to place an hour trach he had copious secretion requiring frequent suctioning as well as nasotracheal  suctioning.Patient was finally discharged from [facility] to SNF3/02/2020, but continued to be febrile.As soon as he presented to facility and was transferred to [facility] emergency department today.  The patient has been admitted to a telemetry bed. General surgery has been consulted and has taken the patient for operative I&D of the sacral wound on 01/28/2020. Cultures were taken. Infectious disease has also been consulted.  Returned to the OR again3/9, 3/11and 3/45fr further debridement.  Subjective 3/24: Patient seen at bedside this morning.  No acute events reported overnight.  He appears slightly diaphoretic and does report he feels a little hot, but does not feel sick.  No chills.  He did also denies chest pain, shortness of breath, backside pain from his wound, nausea vomiting.  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 Lactic acid was 1.7 and procalcitonin was 0.28. Initially this was felt to be due to pneumonia. However the patient had received treatment with Unasyn for pneumonia in February. CXR demonstrated persistent radiological evidence of pneumonia, but clinically the patient has no adventitious lung sounds, no cough, and he is saturating 98% on room air. However, CT does show evidence of Stage IV decubitus sacral ulcer with abscess and possible bone involvement.  He has gone formultiple surgical debridementswith general surgerythis  admission.   Persistent intermittent fevers, resolved --CXR on 02/10/20 no acute finding --repeat blood cx neg  Stage IV sacral decubitus ulcerwith abscess and possible bony involvement.  S/P I & D with general surgeryx3. The patient has a history of shrapnel, sowould not be a good candidate for MRI.CRP elevated at 9.7. ESR is 126.Albumin 2.1. Culture has grown outVanc resistantE. Faecium and possible anaerobe. --ID consulted, appreciate recommendations --unlikely sacral wound will heal with poor nutritional and functional status, palliative consulted --general surgery following PLAN: --ContinueonIV Unasyn and Daptomycin  --fluconazole 400 mg x1 week via G-tube, per ID, last day 3/24. --wound vac change MWF --palliative care following, patient will be followed by outpatient palliative at SNF after discharge --patient to be discharged with wound VAC --No further surgical procedure, per surgery --wound care, dressing changes, off loading, air mattress, and Wound Clinic follow up.  ?Recurrent aspiration pneumonia versus HCAP:resolved ENT was able to remove a large piece of retained material from vallecula on 3/11. --Speech evaluations, ongoing, can have ice chips --continue tube feeds by PEG tube  Dysphagia:mgmt as above  Hypernatremia: Resolved.  Napeakedto 153. Getting free waterflushes by PEG tube. --Free water flushes: 200 cc every 4 hours --Adjust free water as needed to maintain normal sodium level  Hypertension: Chronic. Continue home medications.  Severe protein calorie malnutrition: Nutrition consult appreciated. This severely impacts the patient's ability to heal these wounds.  Anemia: Likely due to chronic disease.  Stable and no evidence of bleeding. --Monitor CBC  Chronic atrial fibrillation(HCC)-The patient has a CHADS2VASC score of >3.  Rate controlled.  --continue eliquis  --Continue metoprolol and amiodarone   Type 2 Diabetes:  Hb A1c is 4.2.  --Oral antihyperglycemics have been held.  --carbohydrate controlled diet/tube feeds and hypoglycemic protocol. --d/c fingersticks and SSI since pt has needed very little insulin thus far  Parkinson disease: Noted. Likely playing a part in patient's history of aspiration. --ContinueCogentin, Sinemet  Encephalopathy: Status post hypoxic respiratory arrest requiring intubation in the setting of Covid in December 2020.Patient with some minimal improvements per family started to respond to his name at times but mainly remains nonverbal bedbound incontinent status post PEG tube. Lurline Idol has been decannulated perLTAC notes if patient has significant secretions may need to replace tracheostomy. At this point appears to be stable.   DVT prophylaxis: SCDs   Code Status: DNR  Family Communication: None at bedside during encounter, will call  Disposition Plan: Possibly to SNF tomorrow.  ID has determined patient's infection sensitive to doxycycline, so no longer need daptomycin which was a barrier due to cost.  Can now go on doxy and Rocephin Coming From SNF Exp DC Date 3/25 Barriers as above Medically Stable for Discharge?  Yes  Consultants:  General Surgery  Infectious Disease  Palliative Care  Procedures:  Surgical debridement of sacral decubitus ulcer - multiple  Antimicrobials:  Unasyn, Daptomycin  Objective: Vitals:   02/13/20 1810 02/13/20 2010 02/14/20 0354 02/14/20 0809  BP: (!) 142/85 130/73 (!) 163/73 (!) 153/70  Pulse: 82 81 84 86  Resp:    14  Temp:  98.1 F (36.7 C) 99.2 F (37.3 C) 98.1 F (36.7 C)  TempSrc:  Oral Oral Oral  SpO2:  100% 99% 100%  Height:        Intake/Output Summary (Last 24 hours) at 02/14/2020 0837 Last data filed at 02/14/2020 0713 Gross per 24 hour  Intake 1458.99 ml  Output 1962 ml  Net -503.01 ml   [employer]  Examination:  General exam: awake, alert, no acute distress Respiratory system: CTAB, normal respiratory effort. Cardiovascular system: normal S1/S2, RRR, no pedal edema.   Gastrointestinal system: soft, NT, ND, no HSM felt, +bowel sounds, PEG tube in place. Central nervous system: A&O x3.  Follows commands.  Speech delay but normal. Extremities: moves all, no cyanosis, normal tone Psychiatry: normal mood, congruent affect    Data Reviewed: I have personally reviewed following labs and imaging studies  CBC: Recent Labs  Lab 02/10/20 0445 02/11/20 0652 02/12/20 0736 02/13/20 0436 02/14/20 0440  WBC 9.1 8.5 9.5 9.0 8.8  HGB 7.7* 7.4* 7.9* 8.7* 8.5*  HCT 25.4* 24.2* 25.6* 28.4* 27.5*  MCV 106.3* 105.2* 104.1* 103.6* 102.6*  PLT 322 342 363 382 366*   Basic Metabolic Panel: Recent Labs  Lab 02/10/20 0445 02/11/20 0652 02/12/20 0736 02/13/20 0436 02/14/20 0440  NA 143 143 140 139 138  K 4.3 3.8 3.9 3.9 4.2  CL 111 109 108 107 106  CO2 _0 GLUCOSE 103* 118* 135* 138* 108*  BUN <5* 24* 22 25* 27*  CREATININE 0.45* 0.45* 0.56* 0.34* 0.36*  CALCIUM 9.5 9.1 9.1 9.1 9.2  MG 2.2 2.5* 2.3 2.3 2.4   GFR: Estimated Creatinine Clearance: 90.8 mL/min (A) (by C-G formula based on SCr of 0.36 mg/dL (L)). Liver Function Tests: No results for input(s): AST, ALT, ALKPHOS, BILITOT, PROT, ALBUMIN in the last 168 hours. No results for input(s): LIPASE, AMYLASE in the last 168 hours. No results for input(s): AMMONIA in the last 168 hours. Coagulation Profile: No results for input(s): INR, PROTIME in the last 168 hours. Cardiac Enzymes: Recent Labs  Lab 02/08/20 0808  CKTOTAL 60   BNP (last 3 results) No results for input(s): PROBNP in the last 8760 hours. HbA1C: No results for input(s): HGBA1C in the last 72 hours. CBG: Recent Labs  Lab 02/13/20 1653 02/13/20 2012 02/14/20 0011 02/14/20 0358 02/14/20 0811  GLUCAP 127* 123* 128* 128* 120*   Lipid Profile:  No results for input(s): CHOL, HDL, LDLCALC, TRIG, CHOLHDL, LDLDIRECT in the last 72 hours. Thyroid Function Tests: No results for input(s): TSH, T4TOTAL, FREET4, T3FREE, THYROIDAB in the last 72 hours. Anemia Panel: No results for input(s): VITAMINB12, FOLATE, FERRITIN, TIBC, IRON, RETICCTPCT in the last 72 hours. Sepsis Labs: No results for input(s): PROCALCITON, LATICACIDVEN in the last 168 hours.  Recent Results (from the past 240 hour(s))  Aerobic/Anaerobic Culture (surgical/deep wound)     Status: None   Collection Time: 02/05/20 11:48 AM   Specimen: PATH Other; Tissue  Result Value Ref Range Status   Specimen Description TISSUE SACRAL  Final   Special Requests NONE  Final   Gram Stain   Final    RARE WBC PRESENT, PREDOMINANTLY PMN FEW GRAM POSITIVE COCCI IN PAIRS    Culture   Final    RARE VANCOMYCIN RESISTANT ENTEROCOCCUS ISOLATED NO ANAEROBES ISOLATED Performed at [facility], [address] [place], [place] [address]    Report Status 02/10/2020 FINAL  Final   Organism ID, Bacteria VANCOMYCIN RESISTANT ENTEROCOCCUS ISOLATED  Final      Susceptibility   Vancomycin resistant enterococcus isolated - MIC*    AMPICILLIN >=32 RESISTANT Resistant     VANCOMYCIN >=32 RESISTANT Resistant     GENTAMICIN SYNERGY SENSITIVE Sensitive     LINEZOLID 2 SENSITIVE Sensitive     * RARE VANCOMYCIN RESISTANT ENTEROCOCCUS ISOLATED  Aerobic/Anaerobic Culture (surgical/deep wound)     Status: None  Collection Time: 02/05/20 11:48 AM   Specimen: PATH Other; Tissue  Result Value Ref Range Status   Specimen Description TISSUE  Final   Special Requests LEFT TROCHANTERIC  Final   Gram Stain   Final    RARE WBC PRESENT,BOTH PMN AND MONONUCLEAR FEW GRAM POSITIVE COCCI IN PAIRS    Culture   Final    FEW VANCOMYCIN RESISTANT ENTEROCOCCUS ISOLATED NO ANAEROBES ISOLATED Performed at [facility], [address] [place], [place] [address]    Report Status 02/09/2020 FINAL  Final    Organism ID, Bacteria VANCOMYCIN RESISTANT ENTEROCOCCUS ISOLATED  Final      Susceptibility   Vancomycin resistant enterococcus isolated - MIC*    AMPICILLIN >=32 RESISTANT Resistant     VANCOMYCIN >=32 RESISTANT Resistant     GENTAMICIN SYNERGY SENSITIVE Sensitive     LINEZOLID 2 SENSITIVE Sensitive     * FEW VANCOMYCIN RESISTANT ENTEROCOCCUS ISOLATED  Culture, blood (Routine X 2) w Reflex to ID Panel     Status: None   Collection Time: 02/08/20 10:41 AM   Specimen: BLOOD  Result Value Ref Range Status   Specimen Description BLOOD BLOOD LEFT ARM  Final   Special Requests   Final    BOTTLES DRAWN AEROBIC AND ANAEROBIC Blood Culture adequate volume   Culture   Final    NO GROWTH 5 DAYS Performed at [facility], [address] [place], [place] [address]    Report Status 02/13/2020 FINAL  Final  Culture, blood (Routine X 2) w Reflex to ID Panel     Status: None   Collection Time: 02/08/20 10:51 AM   Specimen: BLOOD  Result Value Ref Range Status   Specimen Description BLOOD BLOOD RIGHT HAND  Final   Special Requests   Final    BOTTLES DRAWN AEROBIC AND ANAEROBIC Blood Culture adequate volume   Culture   Final    NO GROWTH 5 DAYS Performed at [facility], [address] [place], [place] [address]    Report Status 02/13/2020 FINAL  Final         Radiology Studies: No results found.      Scheduled Meds: . amiodarone  100 mg Per Tube BID  . apixaban  5 mg Per Tube BID  . benztropine  0.5 mg Per Tube BID  . carbidopa-levodopa  1 tablet Per Tube BID  . diltiazem  30 mg Per Tube QID  . famotidine  20 mg Per Tube Daily  . feeding supplement (PRO-STAT SUGAR FREE 64)  30 mL Per Tube BID  . fluconazole  400 mg Oral Daily  . free water  200 mL Per Tube Q4H  . hydrALAZINE  50 mg Per Tube Q8H  . metoprolol tartrate  12.5 mg Per Tube BID  . nutrition supplement (JUVEN)  1 packet Per Tube BID BM  . sodium chloride flush  10 mL Intravenous Q12H  .  thiamine  200 mg Per Tube Daily   Continuous Infusions: . sodium chloride Stopped (02/12/20 1228)  . ampicillin-sulbactam (UNASYN) IV 3 g (02/14/20 0543)  . DAPTOmycin (CUBICIN)  IV Stopped (02/13/20 2029)  . feeding supplement (JEVITY 1.5 CAL/FIBER) 1,000 mL (02/13/20 1459)     LOS: 17 days    Time spent: 30 minutes    Ezekiel Slocumb, DO [facility]   If 7PM-7AM, please contact night-coverage [email or web address] 02/14/2020, 8:37 AM

## 2020-02-14 NOTE — Progress Notes (Signed)
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  Results for orders placed or performed during the hospital encounter of 01/27/20  Blood Culture (routine x 2)     Status: None   Collection Time: 01/27/20 10:42 PM   Specimen: BLOOD  Result Value Ref Range Status   Specimen Description BLOOD BLOOD LEFT HAND  Final   Special Requests   Final    BOTTLES DRAWN AEROBIC AND ANAEROBIC Blood Culture adequate volume   Culture   Final    NO GROWTH 5 DAYS Performed at [facility], [address], [place], [place] [address]    Report Status 02/01/2020 FINAL  Final  Blood Culture (routine x 2)     Status: None   Collection Time: 01/27/20 10:42 PM   Specimen: BLOOD  Result Value Ref Range Status   Specimen Description BLOOD BLOOD LEFT HAND  Final   Special Requests   Final    BOTTLES DRAWN AEROBIC AND ANAEROBIC Blood Culture adequate volume   Culture   Final    NO GROWTH 5 DAYS Performed at [facility], [address], [place], [place] [address]    Report Status 02/01/2020 FINAL  Final  Respiratory Panel by RT PCR (Flu A&B, Covid) - Nasopharyngeal Swab     Status: None   Collection Time: 01/27/20 11:57 PM   Specimen: Nasopharyngeal Swab  Result Value Ref Range Status   SARS Coronavirus 2 by RT PCR NEGATIVE NEGATIVE Final    Comment: (NOTE) SARS-CoV-2 target nucleic acids are NOT DETECTED. The SARS-CoV-2 RNA is generally detectable in upper respiratoy specimens during the acute phase of infection. The lowest concentration of SARS-CoV-2 viral copies this assay can detect is 131 copies/mL. A negative result does not preclude SARS-Cov-2 infection and should not be used as the sole basis for treatment or other patient management decisions. A negative result may occur with  improper specimen collection/handling, submission of  specimen other than nasopharyngeal swab, presence of viral mutation(s) within the areas targeted by this assay, and inadequate number of viral copies (<131 copies/mL). A negative result must be combined with clinical observations, patient history, and epidemiological information. The expected result is Negative. Fact Sheet for Patients:  PinkCheek.be Fact Sheet for Healthcare Providers:  GravelBags.it This test is not yet ap proved or cleared by the [place] FDA and  has been authorized for detection and/or diagnosis of SARS-CoV-2 by FDA under an Emergency Use Authorization (EUA). This EUA will remain  in effect (meaning this test can be used) for the duration of the COVID-19 declaration under Section 564(b)(1) of the Act, 21 U.S.C. section 360bbb-3(b)(1), unless the authorization is terminated or revoked sooner.    Influenza A by PCR NEGATIVE NEGATIVE Final   Influenza B by PCR NEGATIVE NEGATIVE Final    Comment: (NOTE) The Xpert Xpress SARS-CoV-2/FLU/RSV assay is intended as an aid in  the diagnosis of influenza from Nasopharyngeal swab specimens and  should not be used as a sole basis for treatment. Nasal washings and  aspirates are unacceptable for Xpert Xpress SARS-CoV-2/FLU/RSV  testing. Fact Sheet for Patients: PinkCheek.be Fact Sheet for Healthcare Providers: GravelBags.it This test is not yet approved or cleared by the [place] FDA and  has been authorized for detection and/or diagnosis of SARS-CoV-2 by  FDA under an Emergency Use Authorization (EUA). This EUA will remain  in effect (meaning this test  can be used) for the duration of the  Covid-19 declaration under Section 564(b)(1) of the Act, 21  U.S.C. section 360bbb-3(b)(1), unless the authorization is  terminated or revoked. Performed at [facility], [address]  [place], [place] [address]   Aerobic/Anaerobic Culture (surgical/deep wound)     Status: None   Collection Time: 01/28/20  1:06 PM   Specimen: PATH Soft tissue  Result Value Ref Range Status   Specimen Description   Final    TISSUE Performed at [facility], [address], [place], [place] [address]    Special Requests   Final    NONE Performed at [facility], [address] [place], [place] [address]    Gram Stain   Final    RARE WBC PRESENT,BOTH PMN AND MONONUCLEAR NO ORGANISMS SEEN Performed at [facility], [place] [address] [place], [place] [address]    Culture   Final    RARE ENTEROCOCCUS FAECIUM NO ANAEROBES ISOLATED VANCOMYCIN RESISTANT ENTEROCOCCUS ISOLATED    Report Status 02/03/2020 FINAL  Final   Organism ID, Bacteria ENTEROCOCCUS FAECIUM  Final      Susceptibility   Enterococcus faecium - MIC*    AMPICILLIN >=32 RESISTANT Resistant     VANCOMYCIN >=32 RESISTANT Resistant     GENTAMICIN SYNERGY SENSITIVE Sensitive     LINEZOLID 2 SENSITIVE Sensitive     * RARE ENTEROCOCCUS FAECIUM  Aerobic/Anaerobic Culture (surgical/deep wound)     Status: None   Collection Time: 01/28/20  1:54 PM   Specimen: Wound; Body Fluid  Result Value Ref Range Status   Specimen Description   Final    WOUND Performed at [facility], [address] [place], [place] [address]    Special Requests   Final    NONE Performed at [facility], [address] [place], [place] [address]    Gram Stain   Final    FEW WBC PRESENT, PREDOMINANTLY PMN NO ORGANISMS SEEN    Culture   Final    No growth aerobically or anaerobically. Performed at [facility], [place] [address] [place], [place] [address]    Report Status 02/02/2020 FINAL  Final  Aerobic/Anaerobic Culture (surgical/deep wound)     Status: None   Collection Time: 01/28/20  1:55 PM   Specimen: PATH Soft tissue  Result Value Ref Range Status   Specimen Description   Final     TISSUE Performed at [facility], [address] [place], [place] [address]    Special Requests   Final    NONE Performed at [facility], [address] [place], [place] [address]    Gram Stain   Final    RARE WBC PRESENT, PREDOMINANTLY PMN FEW GRAM POSITIVE COCCI IN PAIRS FEW GRAM VARIABLE ROD    Culture   Final    ABUNDANT ENTEROCOCCUS FAECIUM FEW CANDIDA ALBICANS FEW BACTEROIDES THETAIOTAOMICRON BETA LACTAMASE POSITIVE Performed at [facility], [place] [address], [place], [place] [address]    Report Status 02/01/2020 FINAL  Final   Organism ID, Bacteria ENTEROCOCCUS FAECIUM  Final      Susceptibility   Enterococcus faecium - MIC*    AMPICILLIN >=32 RESISTANT Resistant     VANCOMYCIN >=32 RESISTANT Resistant     GENTAMICIN SYNERGY SENSITIVE Sensitive     LINEZOLID 2 SENSITIVE Sensitive     * ABUNDANT ENTEROCOCCUS FAECIUM  Aerobic/Anaerobic Culture (surgical/deep wound)     Status: None  Collection Time: 01/28/20  2:17 PM   Specimen: PATH Bone biopsy; Tissue  Result Value Ref Range Status   Specimen Description   Final    BIOPSY Performed at [facility], [address] [place], [place] [address]    Special Requests   Final    NONE Performed at [facility], [place], [place] [address]    Gram Stain NO WBC SEEN NO ORGANISMS SEEN   Final   Culture   Final    RARE VANCOMYCIN RESISTANT ENTEROCOCCUS ISOLATED NO ANAEROBES ISOLATED Performed at [facility], [place] [address] [place], [place] [address]    Report Status 02/02/2020 FINAL  Final   Organism ID, Bacteria VANCOMYCIN RESISTANT ENTEROCOCCUS ISOLATED  Final      Susceptibility   Vancomycin resistant enterococcus isolated - MIC*    AMPICILLIN >=32 RESISTANT Resistant     VANCOMYCIN >=32 RESISTANT Resistant     GENTAMICIN SYNERGY SENSITIVE Sensitive     LINEZOLID 2 SENSITIVE Sensitive     * RARE VANCOMYCIN RESISTANT ENTEROCOCCUS ISOLATED   MRSA PCR Screening     Status: None   Collection Time: 01/31/20  8:51 PM   Specimen: Nasal Mucosa; Nasopharyngeal  Result Value Ref Range Status   MRSA by PCR NEGATIVE NEGATIVE Final    Comment:        The GeneXpert MRSA Assay (FDA approved for NASAL specimens only), is one component of a comprehensive MRSA colonization surveillance program. It is not intended to diagnose MRSA infection nor to guide or monitor treatment for MRSA infections. Performed at [facility], [address] [place], [place] [address]   Aerobic/Anaerobic Culture (surgical/deep wound)     Status: None   Collection Time: 02/05/20 11:48 AM   Specimen: PATH Other; Tissue  Result Value Ref Range Status   Specimen Description TISSUE SACRAL  Final   Special Requests NONE  Final   Gram Stain   Final    RARE WBC PRESENT, PREDOMINANTLY PMN FEW GRAM POSITIVE COCCI IN PAIRS    Culture   Final    RARE VANCOMYCIN RESISTANT ENTEROCOCCUS ISOLATED NO ANAEROBES ISOLATED Performed at [facility], [place] [address], [place], [place] [address]    Report Status 02/10/2020 FINAL  Final   Organism ID, Bacteria VANCOMYCIN RESISTANT ENTEROCOCCUS ISOLATED  Final      Susceptibility   Vancomycin resistant enterococcus isolated - MIC*    AMPICILLIN >=32 RESISTANT Resistant     VANCOMYCIN >=32 RESISTANT Resistant     GENTAMICIN SYNERGY SENSITIVE Sensitive     LINEZOLID 2 SENSITIVE Sensitive     * RARE VANCOMYCIN RESISTANT ENTEROCOCCUS ISOLATED  Aerobic/Anaerobic Culture (surgical/deep wound)     Status: None   Collection Time: 02/05/20 11:48 AM   Specimen: PATH Other; Tissue  Result Value Ref Range Status   Specimen Description TISSUE  Final   Special Requests LEFT TROCHANTERIC  Final   Gram Stain   Final    RARE WBC PRESENT,BOTH PMN AND MONONUCLEAR FEW GRAM POSITIVE COCCI IN PAIRS    Culture   Final    FEW VANCOMYCIN RESISTANT ENTEROCOCCUS ISOLATED NO ANAEROBES ISOLATED Performed at [facility], [place] [address], [place], [place] [address]    Report Status 02/09/2020 FINAL  Final   Organism ID, Bacteria VANCOMYCIN RESISTANT ENTEROCOCCUS ISOLATED  Final      Susceptibility   Vancomycin resistant enterococcus isolated - MIC*    AMPICILLIN >=32 RESISTANT Resistant  VANCOMYCIN >=32 RESISTANT Resistant     GENTAMICIN SYNERGY SENSITIVE Sensitive     LINEZOLID 2 SENSITIVE Sensitive     * FEW VANCOMYCIN RESISTANT ENTEROCOCCUS ISOLATED  Culture, blood (Routine X 2) w Reflex to ID Panel     Status: None   Collection Time: 02/08/20 10:41 AM   Specimen: BLOOD  Result Value Ref Range Status   Specimen Description BLOOD BLOOD LEFT ARM  Final   Special Requests   Final    BOTTLES DRAWN AEROBIC AND ANAEROBIC Blood Culture adequate volume   Culture   Final    NO GROWTH 5 DAYS Performed at [facility], [address], [place], [place] [address]    Report Status 02/13/2020 FINAL  Final  Culture, blood (Routine X 2) w Reflex to ID Panel     Status: None   Collection Time: 02/08/20 10:51 AM   Specimen: BLOOD  Result Value Ref Range Status   Specimen Description BLOOD BLOOD RIGHT HAND  Final   Special Requests   Final    BOTTLES DRAWN AEROBIC AND ANAEROBIC Blood Culture adequate volume   Culture   Final    NO GROWTH 5 DAYS Performed at [facility], [address], [place], [place] [address]    Report Status 02/13/2020 FINAL  Final     LABS Lab Results  Component Value Date   CREATININE 0.36 (L) 02/14/2020   Lab Results  Component Value Date   WBC 8.8 02/14/2020   HGB 8.5 (L) 02/14/2020   HCT 27.5 (L) 02/14/2020   MCV 102.6 (H) 02/14/2020   PLT 438 (H) 02/14/2020   Lab Results  Component Value Date   ESRSEDRATE 127 (H) 02/14/2020   Lab Results  Component Value Date   CRP 5.1 (H) 02/13/2020    Allergies: No Known Allergies  Discharge antibiotics Ceftriaxone 2 grams every 24 Hours x 6 weeks from surgery March 7th  Doxy 100 mg PO BID x 6  weeks from surgery March 7th  Flaygl 500 mg PO BID for 2 more weeks   PICC Care per protocol Labs weekly while on IV antibiotics -FAX weekly labs to [phone] CBC w diff   Comprehensive met panel CRP   Planned duration of antibiotics 6 weeks from surgery March 7th  Stop date April 18th for ceftriaxone and doxy. April 7 for oral flayl  Follow up clinic dateTelevisit from facility week of April 12th  Leonel Ramsay, MD

## 2020-02-14 NOTE — Progress Notes (Signed)
Occupational Therapy Treatment Patient Details Name: Kyle [name] MRN: [id number] DOB: 1946/02/16 Today's Date: 02/14/2020    History of present illness Kyle [name] is a 74 y.o. male with medical history significant of COVID 11/02/2019.  CVA, prediabetes, PE around 2018 started on Xarelto, chronic tremors of bilateral hands, bipolar disorder, morbid obesity, CKD, HLD, neuropathy, chronic atrial fibrillation, DM2, COPD, Parkinson's disease. He has been in LTAC and was discharged to SNF on 01/25/20. Was febrile when arrived to SNF and sent here to ED.   OT comments  Pt seen for OT treatment this date to f/u re: bed mobility and self care ADLs. Pt seen by OT/PT simultaneously this date to maximize therapeutic benefit. Pt participates in lateral rolling 2x bilaterally and lays on each side ~10 mins to allow RN to assesses skin and address wounds. Pt participates in bed-level self care UB hygiene ADLs. Pt requires MOD/MAX hand over hand and use of B UEs for oral care with swab. Overall, SNF remains most appropriate d/c recommendation. Pt with good effort this date.     Follow Up Recommendations  SNF    Equipment Recommendations  Other (comment)(defer to next level of care.)    Recommendations for Other Services      Precautions / Restrictions Precautions Precautions: Fall Precaution Comments: sacral wound and wound vac Restrictions Weight Bearing Restrictions: No Other Position/Activity Restrictions: feeding tube, NPO       Mobility Bed Mobility Overal bed mobility: Needs Assistance Bed Mobility: Rolling Rolling: Max assist;+2 for physical assistance(increasd effort to reach for and grasp bilateral railing to contribute to in-bed rolling)         General bed mobility comments: sup<>sit not attempted this date d/t pt's wound vac dressing somewhat loose 2/2 episode of bowel and bladder incontinence (RN aware).  Transfers                 General transfer comment:  deferred/unsafe    Balance                                           ADL either performed or assessed with clinical judgement   ADL Overall ADL's : Needs assistance/impaired     Grooming: Wash/dry hands;Oral care;Moderate assistance;Maximal assistance;Bed level Grooming Details (indicate cue type and reason): Pt demos increased initiative to complete oral care this date, increased pinch/grasp of oral swab in R hand. MOD/MAX hand over hand and MIN verbal cues to sequence to compelte oral care with swab at bed level with HOB elevated.                               General ADL Comments: extended time with all aspects of self care participation.     Vision Baseline Vision/History: Wears glasses     Perception     Praxis      Cognition Arousal/Alertness: Awake/alert Behavior During Therapy: Flat affect Overall Cognitive Status: Within Functional Limits for tasks assessed                                 General Comments: Pt oriented to self, incorrect birth year stated. Pt thought he was in [place], able to state "maybe" when providing with location choices about being in a hospital. Disoriented to year. More conversational,  states "60s" when asked his favorite music. And overall more responsive with individual responses versus rote response or echolalia.        Exercises Other Exercises Other Exercises: Extended time spent on mobilizing pt (lateral rolling x2 each side) with extended time spent facing each direction, while RN in room to assist with peri care and addressing wounds on pt's back/bottom. Pt tolerates well (approx 10 mins per side total)   Shoulder Instructions       General Comments      Pertinent Vitals/ Pain       Pain Assessment: Faces Faces Pain Scale: No hurt Pain Location: pt does not demo any grimacing with lateral rolling during session.  Home Living                                           Prior Functioning/Environment              Frequency  Min 1X/week        Progress Toward Goals  OT Goals(current goals can now be found in the care plan section)     Acute Rehab OT Goals Patient Stated Goal: none stated OT Goal Formulation: Patient unable to participate in goal setting Time For Goal Achievement: 02/26/20 Potential to Achieve Goals: [place] Discharge plan remains appropriate;Frequency remains appropriate    Co-evaluation    PT/OT/SLP Co-Evaluation/Treatment: Yes Reason for Co-Treatment: Complexity of the patient's impairments (multi-system involvement);For patient/therapist safety;Necessary to address cognition/behavior during functional activity PT goals addressed during session: Mobility/safety with mobility;Strengthening/ROM OT goals addressed during session: ADL's and self-care      AM-PAC OT "6 Clicks" Daily Activity     Outcome Measure   Help from another person eating meals?: A Lot Help from another person taking care of personal grooming?: A Lot Help from another person toileting, which includes using toliet, bedpan, or urinal?: Total Help from another person bathing (including washing, rinsing, drying)?: Total Help from another person to put on and taking off regular upper body clothing?: Total Help from another person to put on and taking off regular lower body clothing?: Total 6 Click Score: 8    End of Session    OT Visit Diagnosis: Other abnormalities of gait and mobility (R26.89);Muscle weakness (generalized) (M62.81)   Activity Tolerance Patient tolerated treatment well   Patient Left in bed;with call bell/phone within reach;with bed alarm set   Nurse Communication          Time: 4492-0100 OT Time Calculation (min): 37 min  Charges: OT General Charges $OT Visit: 1 Visit OT Treatments $Self Care/Home Management : 8-22 mins  Gerrianne Scale, [place], OTR/L ascom [phone] 02/14/20, 2:39 PM

## 2020-02-14 NOTE — Progress Notes (Signed)
Nutrition Follow-up  DOCUMENTATION CODES:   Not applicable  INTERVENTION:   Continue Jevity 1.5 Cal at 70 mL/hr per G-tube (1680 mL goal daily volume  Continue Pro-Stat 30 mL BID per G-tube (100 kcal and 15 grams of protein per 30 mL)  Continue Juven packet (each packet provides 95 kcal, 7 grams L-Arginine, 7 grams L-Glutamine, 2.5 grams protein in form of collagen, and vitamins/minerals essential for wound healing)  -Total regimen provides 2910 kcal, 142 grams of protein, 35 grams of fiber, 1277 mL H2O  With free water flush of 200 mL Q4hrs patient will receive a total of 2477 mL H2O daily. He will also receive free water with medications.  NUTRITION DIAGNOSIS:   Inadequate oral intake related to dysphagia as evidenced by NPO status, other (comment)(pt with PEG tube). Ongoing - addressing with TF regimen.  GOAL:   Patient will meet greater than or equal to 90% of their needs Met with TF regimen.  MONITOR:   Labs, Weight trends, TF tolerance, Skin, I & O's  ASSESSMENT:   74 y.o. 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 Pt s/p I & D and VAC placement 3/7   Pt tolerating new tube feed regimen well. Recommend continue supplements and vitamins to support wound healing. There is no new weight in chart as pt is on a bed that does not have a scale and pt is unable to stand. RD completed NFPE today; pt noted to have moderate muscle depletions in his BLE which were noted on admission. Pt does not appear to have any new fat or muscle depletions.   Medications reviewed and include: pepcid, diflucan,  thiamine, unasyn, daptomycin   Labs reviewed: BUN 27(H), creat 0.36(H) Hgb 8.5(L), Hct 27.5(L), MCV 102.6(H) cbgs- 128, 128, 120, 106 x 24 hrs  Diet Order:   Diet Order            Diet NPO time specified  Diet effective midnight             EDUCATION NEEDS:   Not appropriate for education at this time  Skin:  Skin Assessment: Reviewed RN Assessment(sacrum:  20 cm x 14 cm x 4 cm, L trochanter:  8 cm  X 6 cm x 4 cm)  Last BM:  3/23- type 6  Height:   Ht Readings from Last 1 Encounters:  01/27/20 '5\' 9"'$  (1.753 m)   Weight:   Wt Readings from Last 1 Encounters:  08/30/19 113.2 kg  Patient was 86.2 kg on 01/27/2020. Unsure why that weight is not pulling into note.  Ideal Body Weight:  72.7 kg  BMI:  Body mass index is 28.06 kg/m.  Estimated Nutritional Needs:   Kcal:  6237-6283 (33-35 kcal/kg)  Protein:  130-145 grams (1.5-1.7 grams/kg)  Fluid:  2.2-2.6 L/day  Koleen Distance MS, RD, LDN Contact information available in Amion

## 2020-02-14 NOTE — Plan of Care (Signed)

## 2020-02-15 LAB — CBC
HCT: 25.2 % — ABNORMAL LOW (ref 39.0–52.0)
Hemoglobin: 8 g/dL — ABNORMAL LOW (ref 13.0–17.0)
MCH: 32.4 pg (ref 26.0–34.0)
MCHC: 31.7 g/dL (ref 30.0–36.0)
MCV: 102 fL — ABNORMAL HIGH (ref 80.0–100.0)
Platelets: 450 10*3/uL — ABNORMAL HIGH (ref 150–400)
RBC: 2.47 MIL/uL — ABNORMAL LOW (ref 4.22–5.81)
RDW: 15.1 % (ref 11.5–15.5)
WBC: 9.8 10*3/uL (ref 4.0–10.5)
nRBC: 0.2 % (ref 0.0–0.2)

## 2020-02-15 LAB — BASIC METABOLIC PANEL
Anion gap: 6 (ref 5–15)
BUN: 20 mg/dL (ref 8–23)
CO2: 25 mmol/L (ref 22–32)
Calcium: 9 mg/dL (ref 8.9–10.3)
Chloride: 106 mmol/L (ref 98–111)
Creatinine, Ser: <0.3 mg/dL — ABNORMAL LOW (ref 0.61–1.24)
Glucose, Bld: 139 mg/dL — ABNORMAL HIGH (ref 70–99)
Potassium: 4.1 mmol/L (ref 3.5–5.1)
Sodium: 137 mmol/L (ref 135–145)

## 2020-02-15 LAB — MAGNESIUM: Magnesium: 2.4 mg/dL (ref 1.7–2.4)

## 2020-02-15 LAB — SARS CORONAVIRUS 2 (TAT 6-24 HRS): SARS Coronavirus 2: NEGATIVE

## 2020-02-15 LAB — GLUCOSE, CAPILLARY: Glucose-Capillary: 133 mg/dL — ABNORMAL HIGH (ref 70–99)

## 2020-02-15 LAB — CK: Total CK: 35 U/L — ABNORMAL LOW (ref 49–397)

## 2020-02-15 MED ORDER — METRONIDAZOLE 500 MG PO TABS: 500.0000 mg | ORAL_TABLET | Freq: Three times a day (TID) | ORAL | 0 refills | Status: DC

## 2020-02-15 MED ORDER — FREE WATER: 200.0000 mL | Status: DC

## 2020-02-15 MED ORDER — PRO-STAT SUGAR FREE PO LIQD: 30.0000 mL | Freq: Two times a day (BID) | ORAL | 0 refills | Status: DC

## 2020-02-15 MED ORDER — ACETAMINOPHEN 160 MG/5ML PO SOLN: 650.0000 mg | Freq: Four times a day (QID) | ORAL | 0 refills | Status: DC | PRN

## 2020-02-15 MED ORDER — DOXYCYCLINE HYCLATE 100 MG PO TABS: 100.0000 mg | ORAL_TABLET | Freq: Two times a day (BID) | ORAL | 0 refills | Status: DC

## 2020-02-15 MED ORDER — CEFTRIAXONE IV (FOR PTA / DISCHARGE USE ONLY): 2.0000 g | INTRAVENOUS | 0 refills | Status: DC

## 2020-02-15 MED ORDER — IBUPROFEN 100 MG/5ML PO SUSP: 600.0000 mg | Freq: Three times a day (TID) | ORAL | Status: DC | PRN

## 2020-02-15 MED ORDER — CHLORHEXIDINE GLUCONATE CLOTH 2 % EX PADS: 6.0000 | MEDICATED_PAD | Freq: Every day | CUTANEOUS | Status: DC

## 2020-02-15 MED ORDER — JUVEN PO PACK: 1.0000 | PACK | Freq: Two times a day (BID) | ORAL | 0 refills | Status: DC

## 2020-02-15 MED ORDER — IPRATROPIUM-ALBUTEROL 0.5-2.5 (3) MG/3ML IN SOLN: 3.0000 mL | RESPIRATORY_TRACT | Status: DC | PRN

## 2020-02-15 NOTE — TOC Progression Note (Addendum)
Transition of Care [facility]) - Progression Note    Patient Details  Name: Kyle [name] MRN: [id number] Date of Birth: [birth year]  [facility] CM/SW Contact  Shawn Route, RN Phone Number: 02/15/2020, 9:05 AM  Clinical Narrative:     Spoke with Sister this morning to let her know that Brother will Transfer to [facility] today.  Update on his status.   Spoke with Tresa Endo at [facility] and verified Wound Vac is being delivered today, she reports that it should be in building by 10am.      Expected Discharge Plan: Skilled Nursing Facility Barriers to Discharge: Continued Medical Work up  Expected Discharge Plan and Services Expected Discharge Plan: Skilled Nursing Facility                                               Social Determinants of Health (SDOH) Interventions    Readmission Risk Interventions No flowsheet data found.

## 2020-02-15 NOTE — Discharge Summary (Signed)
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 2.5* 2.3 2.3 2.4 2.4   Liver Function Tests: No results for input(s): AST, ALT, ALKPHOS, BILITOT, PROT, ALBUMIN in the last 168 hours. No results for input(s): LIPASE, AMYLASE in the last 168 hours. No results for input(s): AMMONIA in the last 168 hours. CBC: Recent Labs  Lab 02/11/20 0652 02/12/20 0736 02/13/20 0436 02/14/20 0440 02/15/20 0520  WBC 8.5 9.5 9.0 8.8 9.8  HGB 7.4* 7.9* 8.7* 8.5* 8.0*  HCT 24.2* 25.6* 28.4* 27.5* 25.2*  MCV 105.2* 104.1* 103.6* 102.6* 102.0*  PLT 342 363 382 438* 450*   Cardiac Enzymes: Recent Labs  Lab 02/15/20 0520  CKTOTAL 35*   BNP: Invalid input(s):  POCBNP CBG: Recent Labs  Lab 02/14/20 0358 02/14/20 0811 02/14/20 1209 02/14/20 1632 02/15/20 0823  GLUCAP 128* 120* 106* 136* 133*  D-Dimer No results for input(s): DDIMER in the last 72 hours. Hgb A1c No results for input(s): HGBA1C in the last 72 hours. Lipid Profile No results for input(s): CHOL, HDL, LDLCALC, TRIG, CHOLHDL, LDLDIRECT in the last 72 hours. Thyroid function studies No results for input(s): TSH, T4TOTAL, T3FREE, THYROIDAB in the last 72 hours.  Invalid input(s): FREET3 Anemia work up No results for input(s): VITAMINB12, FOLATE, FERRITIN, TIBC, IRON, RETICCTPCT in the last 72 hours. Urinalysis    Component Value Date/Time   COLORURINE YELLOW (A) 01/27/2020 2221   APPEARANCEUR CLEAR (A) 01/27/2020 2221   APPEARANCEUR Clear 06/01/2012 0730   LABSPEC 1.012 01/27/2020 2221   LABSPEC 1.014 06/01/2012 0730   PHURINE 7.0 01/27/2020 2221   GLUCOSEU NEGATIVE 01/27/2020 2221   GLUCOSEU Negative 06/01/2012 0730   HGBUR NEGATIVE 01/27/2020 2221   BILIRUBINUR NEGATIVE 01/27/2020 2221   BILIRUBINUR Negative 06/01/2012 0730   KETONESUR NEGATIVE 01/27/2020 2221   PROTEINUR NEGATIVE 01/27/2020 2221   NITRITE NEGATIVE 01/27/2020 2221   LEUKOCYTESUR NEGATIVE 01/27/2020 2221   LEUKOCYTESUR Trace 06/01/2012 0730   Sepsis Labs Invalid input(s): PROCALCITONIN,  WBC,  LACTICIDVEN Microbiology Recent Results (from the past 240 hour(s))  Aerobic/Anaerobic Culture (surgical/deep wound)     Status: None   Collection Time: 02/05/20 11:48 AM   Specimen: PATH Other; Tissue  Result Value Ref Range Status   Specimen Description TISSUE SACRAL  Final   Special Requests NONE  Final   Gram Stain   Final    RARE WBC PRESENT, PREDOMINANTLY PMN FEW GRAM POSITIVE COCCI IN PAIRS    Culture   Final    RARE VANCOMYCIN RESISTANT ENTEROCOCCUS ISOLATED NO ANAEROBES ISOLATED Performed at [facility], [place] [address], [place], [place] [address]    Report Status 02/10/2020 FINAL   Final   Organism ID, Bacteria VANCOMYCIN RESISTANT ENTEROCOCCUS ISOLATED  Final      Susceptibility   Vancomycin resistant enterococcus isolated - MIC*    AMPICILLIN >=32 RESISTANT Resistant     VANCOMYCIN >=32 RESISTANT Resistant     GENTAMICIN SYNERGY SENSITIVE Sensitive     LINEZOLID 2 SENSITIVE Sensitive     * RARE VANCOMYCIN RESISTANT ENTEROCOCCUS ISOLATED  Aerobic/Anaerobic Culture (surgical/deep wound)     Status: None   Collection Time: 02/05/20 11:48 AM   Specimen: PATH Other; Tissue  Result Value Ref Range Status   Specimen Description TISSUE  Final   Special Requests LEFT TROCHANTERIC  Final   Gram Stain   Final    RARE WBC PRESENT,BOTH PMN AND MONONUCLEAR FEW GRAM POSITIVE COCCI IN PAIRS    Culture   Final    FEW VANCOMYCIN RESISTANT ENTEROCOCCUS ISOLATED NO ANAEROBES ISOLATED Performed at [facility], [place] [address], [place], [place] [address]    Report Status 02/09/2020 FINAL  Final   Organism ID, Bacteria VANCOMYCIN RESISTANT ENTEROCOCCUS ISOLATED  Final      Susceptibility   Vancomycin resistant enterococcus isolated - MIC*    AMPICILLIN >=32 RESISTANT Resistant     VANCOMYCIN >=32 RESISTANT Resistant     GENTAMICIN SYNERGY SENSITIVE Sensitive     LINEZOLID 2 SENSITIVE Sensitive     * FEW VANCOMYCIN RESISTANT ENTEROCOCCUS ISOLATED  Culture, blood (Routine X 2) w Reflex to ID Panel     Status: None   Collection Time: 02/08/20 10:41 AM   Specimen: BLOOD  Result Value Ref Range Status   Specimen Description BLOOD BLOOD LEFT ARM  Final   Special Requests   Final  BOTTLES DRAWN AEROBIC AND ANAEROBIC Blood Culture adequate volume   Culture   Final    NO GROWTH 5 DAYS Performed at [facility], [address] [place], [place] [address]    Report Status 02/13/2020 FINAL  Final  Culture, blood (Routine X 2) w Reflex to ID Panel     Status: None   Collection Time: 02/08/20 10:51 AM   Specimen: BLOOD  Result Value Ref Range Status   Specimen  Description BLOOD BLOOD RIGHT HAND  Final   Special Requests   Final    BOTTLES DRAWN AEROBIC AND ANAEROBIC Blood Culture adequate volume   Culture   Final    NO GROWTH 5 DAYS Performed at [facility], [address] [place], [place] [address]    Report Status 02/13/2020 FINAL  Final  SARS CORONAVIRUS 2 (TAT 6-24 HRS) Nasopharyngeal Nasopharyngeal Swab     Status: None   Collection Time: 02/14/20  5:23 PM   Specimen: Nasopharyngeal Swab  Result Value Ref Range Status   SARS Coronavirus 2 NEGATIVE NEGATIVE Final    Comment: (NOTE) SARS-CoV-2 target nucleic acids are NOT DETECTED. The SARS-CoV-2 RNA is generally detectable in upper and lower respiratory specimens during the acute phase of infection. Negative results do not preclude SARS-CoV-2 infection, do not rule out co-infections with other pathogens, and should not be used as the sole basis for treatment or other patient management decisions. Negative results must be combined with clinical observations, patient history, and epidemiological information. The expected result is Negative. Fact Sheet for Patients: SugarRoll.be Fact Sheet for Healthcare Providers: https://www.woods-mathews.com/ This test is not yet approved or cleared by the [place] FDA and  has been authorized for detection and/or diagnosis of SARS-CoV-2 by FDA under an Emergency Use Authorization (EUA). This EUA will remain  in effect (meaning this test can be used) for the duration of the COVID-19 declaration under Section 56 4(b)(1) of the Act, 21 U.S.C. section 360bbb-3(b)(1), unless the authorization is terminated or revoked sooner. Performed at [facility], [place] [address] [place],  [address]      Time coordinating discharge: Over 30 minutes  SIGNED:   Ezekiel Slocumb, DO [facility] 02/15/2020, 9:50 AM   If 7PM-7AM, please contact night-coverage [email or web address]

## 2020-02-15 NOTE — TOC Progression Note (Signed)
Transition of Care [facility]) - Progression Note    Patient Details  Name: Kano Heckmann MRN: [id number] Date of Birth: 10/10/1946  [facility]) CM/SW Contact  Shawn Route, RN Phone Number: 02/15/2020, 10:54 AM  Clinical Narrative:      Spoke with sister and she had a concern and would like an air mattress at SNF.  Talked with Tresa Endo at [facility] and she will order one for him.    Expected Discharge Plan: Skilled Nursing Facility Barriers to Discharge: Barriers Resolved  Expected Discharge Plan and Services Expected Discharge Plan: Skilled Nursing Facility         Expected Discharge Date: 02/15/20                                     Social Determinants of Health (SDOH) Interventions    Readmission Risk Interventions No flowsheet data found.

## 2020-02-15 NOTE — TOC Transition Note (Signed)
Transition of Care [facility]) - CM/SW Discharge Note   Patient Details  Name: Kyle [name] MRN: [id number] Date of Birth: 11/03/1946  Transition of Care [facility]) CM/SW Contact:  Shawn Route, RN Phone Number: 02/15/2020, 10:05 AM   Clinical Narrative:     Patient to be transported to [facility] today via [facility].  They are able to provide a specific time for transport, which is the best option for this patient due to his medically complexity and wound changes for transport.  Transport scheduled for 1145am.    Discharge Packet placed on chart.  Bedside Nurse to call report to facility. No further TOC needs at this time, please re-consult for new needs.     Barriers to Discharge: Continued Medical Work up   Patient Goals and CMS Choice     Choice offered to / list presented to : Sibling  Discharge Placement                       Discharge Plan and Services                                     Social Determinants of Health (SDOH) Interventions     Readmission Risk Interventions No flowsheet data found.

## 2020-02-15 NOTE — Care Management Important Message (Signed)
Important Message  Patient Details  Name: Kyle [name] MRN: [id number] Date of Birth: [birth year]   Medicare Important Message Given:  No  Patient discharged prior to arrival to unit to deliver/review concurrent Medicare IM.    [name] 02/15/2020, 12:15 PM

## 2020-02-15 NOTE — Progress Notes (Signed)
  Speech Language Pathology Treatment: Dysphagia(education)  Patient Details Name: Kyle [name] MRN: [id number] DOB: May 20, 1946 Today's Date: 02/15/2020 Time: 1115-1140 SLP Time Calculation (min) (ACUTE ONLY): 25 min  Assessment / Plan / Recommendation Clinical Impression  Met w/ pt and family present in room today; pt is discharging to SNF for continued care. Discussed w/ family his progress so far and the POC to include continued therapeutic po's w/ skilled ST services and NSG. Encouraged family to make contact w/ the ST services in order to discuss being able to be trained in therapeutic po's as well.  Recommend continue w/ frequent oral care for hygiene and stimulation of swallowing.Recommend Pleasure po SINGLEice chips w/ NSG staff post thorough oral care and positioning upright; aspiration precautions. Therapeutic po trials of Puree and Nectar consistency liquids w/ ST services. NSG supervision for safe, oral intake of ice chips during the weekend.Education posted in room re: oral careand ice chips.It will be vital that skilled Dysphagia therapy and therapeutic po trials continue at discharge to SNF -- the need to establish a structured feeding plan for po trials to be given 3-4x daily w/ NSG and/or family (post thorough family education) in order to meet pt's quality of life goals and protect and preserve pt's oropharyngeal swallow function. MD/NSG updated. ST services will f/unext 1-2 days for ongoingassessment oforopharyngeal swallow function through therapeutic po trials. Handouts were given on the above; precautions and recommendations. Family gave verbal agreement.    HPI HPI: Pt is a 74 y.o. male with medical history significant of Respiratory Arrest Jan. 1, 2021 requirig tracheostomy and PEG tube placement, (now decannulated); NPO status since Jan. 1, 2021; Covid 11/02/2019; CVA; prediabetes; PE around 2018 started on Xarelto; chronic tremors of bilateral hands; Bipolar Disorder;  morbid Obesity; CKD; HLD; neuropathy; chronic atrial fibrillation; DM2; COPD; Parkinson disease. He has been in [place] since Jan. 2021, and was discharged to SNF on 01/25/2020. Was febrile when arrived to SNF and sent here to this ED on 01/27/2020.  Per pt's Sister, pt has not received skilled ST services for Dysphagia prior to this admission.  MBSS x2 this admission during ST therapy services.       SLP Plan  Continue with current plan of care(d/c'ing today)       Recommendations  Diet recommendations: Dysphagia 1 (puree);Nectar-thick liquid(as therapeutic po's) Liquids provided via: Teaspoon Medication Administration: Via alternative means(PEG) Supervision: Full supervision/cueing for compensatory strategies;Staff to assist with self feeding Compensations: Minimize environmental distractions;Slow rate;Small sips/bites Postural Changes and/or Swallow Maneuvers: Seated upright 90 degrees;Upright 30-60 min after meal                General recommendations: (Dietician f/u) Oral Care Recommendations: Oral care QID;Oral care before and after PO;Staff/trained caregiver to provide oral care;Oral care prior to ice chip/H20 Follow up Recommendations: Skilled Nursing facility SLP Visit Diagnosis: Dysphagia, oropharyngeal phase (R13.12);Cognitive communication deficit [phone]) Plan: Continue with current plan of care(d/c'ing today)       GO                 Orinda Kenner, [place], CCC-SLP Kyle [name] 02/15/2020, 12:09 PM

## 2020-02-16 ENCOUNTER — Encounter: Payer: Self-pay | Admitting: Nurse Practitioner

## 2020-02-16 ENCOUNTER — Non-Acute Institutional Stay: Payer: Medicare Other | Admitting: Nurse Practitioner

## 2020-02-16 VITALS — BP 150/80 | HR 86 | Temp 97.7°F | Resp 18 | Wt 189.7 lb

## 2020-02-16 DIAGNOSIS — Z515 Encounter for palliative care: Secondary | ICD-10-CM

## 2020-02-16 DIAGNOSIS — I679 Cerebrovascular disease, unspecified: Secondary | ICD-10-CM

## 2020-02-16 NOTE — Progress Notes (Signed)
Therapist, nutritional Palliative Care Consult Note Telephone: [phone]  Fax: [phone]  PATIENT NAME: Kyle [name] DOB: 04-02-1946 MRN: [id number]  PRIMARY CARE PROVIDER:   [facility]  REFERRING PROVIDER:  Dr [facility]  I was asked by Dr Yetta Flock to see Kyle [name] for Palliative Care consult for goals of care  RESPONSIBLE PARTY:   Kyle [name] sister [phone] or [phone]  RECOMMENDATIONS and PLAN:  1. ACP: full code pending further discussion with family   2. Palliative care encounter; Palliative medicine team will continue to support patient, patient's family, and medical team. Visit consisted of counseling and education dealing with the complex and emotionally intense issues of symptom management and palliative care in the setting of serious and potentially life-threatening illness  I spent 65 minutes providing this consultation,  from 11:40am to 12:45pm. More than 50% of the time in this consultation was spent coordinating communication.   HISTORY OF PRESENT ILLNESS:  Kyle [name] is a 74 y.o. year old male with multiple medical problems including Chronic respiratory failure with hypoxia, cerebrovascular disease, dysphasia, chronic atrial fibrillation, history of pulmonary embolism,  anemia, chronic kidney disease, hypertension, hyperlipidemia, neuropathy, diabetes, tremor, restless leg syndrome, BPH, history of covid-19 fiction, bipolar , a history of suicide attempt, appendectomy. Hospitalized 3/6/ 2021 to 3/25 / 2021 for fevers from a Skilled Nursing Facility. Kyle [name] was discharged to Skilled Nursing Facility after 10 day inpatient for acute hypoxic respiratory failure secondary to covid infection. Kyle [name] was treated with Remdesivir, Rocephin, Azithromycin, Decadron. Hospital stay was in [place]. Complicated by tachycardia, respiratory arrest on 11/2018 one with intubation  and EEG showing consistent mild residual encephalopathy requiring tracheostomy and peg tube placement. Kyle [name] developed a pressure sacral ulcer which required to bereavement. Kyle [name] was transferred to [facility] in [place] trach decannulate is but remained non-verbal. February Kyle [name] developed pneumonia worse than eating aspiration treated with unison. Kyle [name] failed trial of decannulation an ENT consult done to place in trach. Kyle [name]  was discharged from LTAC to SNF on 3/4 / 2021 continued to be febrile and return for this hospitalization. Workout significant for sepsis due to sacral ulcer stage 4 with abscesses;  albumin 2.1. Unlikely sacral wounds will heal with poor nutrition and functional status plan antibiotic therapy, wound care and palliative care. Recurrent pneumonia vs hcap resolved. Hypernatremia resolved. Severe protein calorie malnutrition, anemia, chronic atrial fibrillation. Encephalopathy status quo hypoxic respiratory arrest requiring intubation in the setting of covid-19 with minimal improvement remain nonverbal, bed down, incontinence status post peg tube and trach has been decannulated at [facility]. Palliative care saw during hospitalization. Conversations with patients sister documented that he has a daughter and she is involved in his care also and an makes the legal decisions. Prior to covid, Kyle [name] was independently cared for himself and lived alone. Kyle [name]  was discharged to [facility] where he currently is residing. Kyle [name]  is bed-bound require staff to turn in position him. Kyle [name]  is total ADL dependence with incontinence. Kyle [name]  requires assistance feeding. Staff endorses Kyle [name] sleep a lot. Mr [name] does remain on continuous oxygen. At present Kyle [name]  is lying in bed, appears debilitated, chronically ill. No visitors present. I visited and observed Kyle [name] .Kyle [name] did make eye  contact with verbal cues. Kyle [name] was cooperative with assessment. Attempted to explain palliative care visit. With cognitive  limitations no meaningful discussion. Kyle [name] does appear to be chronically ill. Kyle [name] does remaining full code. I have attempted to contact his sister for further discussion of medical goals of care. I updated nursing staff in the new changes that present time until further discussion with his sister. Emotional support provided. Palliative Care was asked to help address goals of care.   CODE STATUS: full code  PPS: 30% HOSPICE ELIGIBILITY/DIAGNOSIS: TBD  PAST MEDICAL HISTORY:  Past Medical History:  Diagnosis Date  . Acute metabolic encephalopathy   . Acute on chronic respiratory failure with hypoxia ([place])   . Bipolar 1 disorder ([place])   . BPH (benign prostatic hyperplasia)   . Cerebrovascular disease   . Chronic atrial fibrillation ([place])   . Chronic kidney disease, stage III (moderate)   . COVID-19 virus infection   . HTN   . Hyperlipidemia   . Neuropathy   . Pneumonia due to COVID-19 virus   . SVT (supraventricular tachycardia) ([place])   . 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 Type 2 diabetes mellitus ([place])     SOCIAL HX:  Social History   Tobacco Use  . Smoking status: Never Smoker  . Smokeless tobacco: Never Used  Substance Use Topics  . Alcohol use: Not Currently    Alcohol/week: 1.0 standard drinks    Types: 1 Shots of liquor per week    Comment: Q day    ALLERGIES: No Known Allergies   PERTINENT MEDICATIONS:  Outpatient Encounter Medications as of 02/16/2020  Medication Sig  . acetaminophen (TYLENOL) 160 MG/5ML solution Place 20.3 mLs (650 mg total) into feeding tube every 6 (six) hours as needed for mild pain.  . Amino Acids-Protein Hydrolys (FEEDING SUPPLEMENT, PRO-STAT SUGAR FREE 64,) LIQD Place 30 mLs into feeding tube 2 (two) times daily.  Marland Kitchen amiodarone (PACERONE) 100 MG tablet Place 100 mg into feeding tube 2 (two)  times daily.  Marland Kitchen apixaban (ELIQUIS) 5 MG TABS tablet Place 5 mg into feeding tube 2 (two) times daily.  . benztropine (COGENTIN) 0.5 MG tablet Place 0.5 mg into feeding tube 2 (two) times daily.  . carbidopa-levodopa (SINEMET IR) 25-100 MG tablet Place 1 tablet into feeding tube 2 (two) times daily.  . cefTRIAXone (ROCEPHIN) IVPB Inject 2 g into the vein daily for 25 days. Indication: sacral wound with osteomyelitis Last Day of Therapy: 03/11/2020 Labs - Once weekly:  CBC/D, CRP and CMP,  . Chlorhexidine Gluconate Cloth 2 % PADS Apply 6 each topically daily.  Marland Kitchen diltiazem (CARDIZEM) 30 MG tablet Place 30 mg into feeding tube 4 (four) times daily.  Marland Kitchen doxycycline (VIBRA-TABS) 100 MG tablet Take 1 tablet (100 mg total) by mouth 2 (two) times daily for 25 days.  . famotidine (PEPCID) 20 MG tablet Place 20 mg into feeding tube daily.  . hydrALAZINE (APRESOLINE) 50 MG tablet Place 50 mg into feeding tube 2 (two) times daily.  Marland Kitchen ibuprofen (ADVIL) 100 MG/5ML suspension Place 30 mLs (600 mg total) into feeding tube every 8 (eight) hours as needed for moderate pain.  Marland Kitchen ipratropium-albuterol (DUONEB) 0.5-2.5 (3) MG/3ML SOLN Take 3 mLs by nebulization every 4 (four) hours as needed.  . metoprolol tartrate (LOPRESSOR) 25 MG tablet Place 12.5 mg into feeding tube 2 (two) times daily.  . metroNIDAZOLE (FLAGYL) 500 MG tablet Take 1 tablet (500 mg total) by mouth 3 (three) times daily for 14 days.  . nutrition supplement, JUVEN, (JUVEN) PACK Place 1 packet into feeding tube  2 (two) times daily between meals.  . polyethylene glycol (MIRALAX / GLYCOLAX) 17 g packet Place 17 g into feeding tube daily.  Marland Kitchen thiamine 100 MG tablet Place 200 mg into feeding tube daily.  . vitamin B-12 (CYANOCOBALAMIN) 1000 MCG tablet Place 2,000 mcg into feeding tube daily.  . Water For Irrigation, Sterile (FREE WATER) SOLN Place 200 mLs into feeding tube every 4 (four) hours.   No facility-administered encounter medications on file as of  02/16/2020.    PHYSICAL EXAM:   General: debilitated, chronically ill, confused, male Cardiovascular: regular rate and rhythm Pulmonary: clear ant fields Extremities: BLE edema, no joint deformities Neurological: functional quadriplegic  Zakiya Sporrer [place], NP

## 2020-02-19 ENCOUNTER — Other Ambulatory Visit: Payer: Self-pay

## 2020-02-19 ENCOUNTER — Inpatient Hospital Stay
Admission: EM | Admit: 2020-02-19 | Discharge: 2020-02-21 | DRG: 871 | Disposition: A | Discharge status: Hospice | Payer: Medicare Other | Source: Skilled Nursing Facility | Attending: Internal Medicine | Admitting: Internal Medicine

## 2020-02-19 ENCOUNTER — Emergency Department: Payer: Medicare Other

## 2020-02-19 DIAGNOSIS — Z452 Encounter for adjustment and management of vascular access device: Secondary | ICD-10-CM

## 2020-02-19 DIAGNOSIS — E860 Dehydration: Secondary | ICD-10-CM | POA: Diagnosis present

## 2020-02-19 DIAGNOSIS — N179 Acute kidney failure, unspecified: Secondary | ICD-10-CM | POA: Diagnosis not present

## 2020-02-19 DIAGNOSIS — Z8673 Personal history of transient ischemic attack (TIA), and cerebral infarction without residual deficits: Secondary | ICD-10-CM | POA: Diagnosis not present

## 2020-02-19 DIAGNOSIS — N400 Enlarged prostate without lower urinary tract symptoms: Secondary | ICD-10-CM | POA: Diagnosis present

## 2020-02-19 DIAGNOSIS — E872 Acidosis: Secondary | ICD-10-CM | POA: Diagnosis present

## 2020-02-19 DIAGNOSIS — L89224 Pressure ulcer of left hip, stage 4: Secondary | ICD-10-CM | POA: Diagnosis present

## 2020-02-19 DIAGNOSIS — G20 Parkinson's disease: Secondary | ICD-10-CM | POA: Diagnosis present

## 2020-02-19 DIAGNOSIS — Z515 Encounter for palliative care: Secondary | ICD-10-CM | POA: Diagnosis not present

## 2020-02-19 DIAGNOSIS — G9340 Encephalopathy, unspecified: Secondary | ICD-10-CM | POA: Diagnosis not present

## 2020-02-19 DIAGNOSIS — E785 Hyperlipidemia, unspecified: Secondary | ICD-10-CM | POA: Diagnosis present

## 2020-02-19 DIAGNOSIS — I482 Chronic atrial fibrillation, unspecified: Secondary | ICD-10-CM | POA: Diagnosis present

## 2020-02-19 DIAGNOSIS — E1165 Type 2 diabetes mellitus with hyperglycemia: Secondary | ICD-10-CM | POA: Diagnosis present

## 2020-02-19 DIAGNOSIS — L98424 Non-pressure chronic ulcer of back with necrosis of bone: Secondary | ICD-10-CM

## 2020-02-19 DIAGNOSIS — A419 Sepsis, unspecified organism: Secondary | ICD-10-CM

## 2020-02-19 DIAGNOSIS — L89154 Pressure ulcer of sacral region, stage 4: Secondary | ICD-10-CM | POA: Diagnosis present

## 2020-02-19 DIAGNOSIS — N183 Chronic kidney disease, stage 3 unspecified: Secondary | ICD-10-CM | POA: Diagnosis present

## 2020-02-19 DIAGNOSIS — Z8616 Personal history of COVID-19: Secondary | ICD-10-CM | POA: Diagnosis not present

## 2020-02-19 DIAGNOSIS — I959 Hypotension, unspecified: Secondary | ICD-10-CM | POA: Diagnosis not present

## 2020-02-19 DIAGNOSIS — J449 Chronic obstructive pulmonary disease, unspecified: Secondary | ICD-10-CM | POA: Diagnosis present

## 2020-02-19 DIAGNOSIS — Z79899 Other long term (current) drug therapy: Secondary | ICD-10-CM | POA: Diagnosis not present

## 2020-02-19 DIAGNOSIS — F3130 Bipolar disorder, current episode depressed, mild or moderate severity, unspecified: Secondary | ICD-10-CM | POA: Diagnosis present

## 2020-02-19 DIAGNOSIS — Z66 Do not resuscitate: Secondary | ICD-10-CM | POA: Diagnosis present

## 2020-02-19 DIAGNOSIS — Z7189 Other specified counseling: Secondary | ICD-10-CM | POA: Diagnosis not present

## 2020-02-19 DIAGNOSIS — G9341 Metabolic encephalopathy: Secondary | ICD-10-CM | POA: Diagnosis not present

## 2020-02-19 DIAGNOSIS — E870 Hyperosmolality and hypernatremia: Secondary | ICD-10-CM | POA: Diagnosis present

## 2020-02-19 DIAGNOSIS — I10 Essential (primary) hypertension: Secondary | ICD-10-CM | POA: Diagnosis present

## 2020-02-19 DIAGNOSIS — R6520 Severe sepsis without septic shock: Secondary | ICD-10-CM

## 2020-02-19 DIAGNOSIS — E119 Type 2 diabetes mellitus without complications: Secondary | ICD-10-CM

## 2020-02-19 DIAGNOSIS — N189 Chronic kidney disease, unspecified: Secondary | ICD-10-CM | POA: Diagnosis not present

## 2020-02-19 DIAGNOSIS — R6521 Severe sepsis with septic shock: Secondary | ICD-10-CM | POA: Diagnosis present

## 2020-02-19 DIAGNOSIS — E1140 Type 2 diabetes mellitus with diabetic neuropathy, unspecified: Secondary | ICD-10-CM | POA: Diagnosis present

## 2020-02-19 DIAGNOSIS — Z931 Gastrostomy status: Secondary | ICD-10-CM

## 2020-02-19 DIAGNOSIS — Z86711 Personal history of pulmonary embolism: Secondary | ICD-10-CM | POA: Diagnosis present

## 2020-02-19 DIAGNOSIS — E1122 Type 2 diabetes mellitus with diabetic chronic kidney disease: Secondary | ICD-10-CM | POA: Diagnosis present

## 2020-02-19 DIAGNOSIS — G629 Polyneuropathy, unspecified: Secondary | ICD-10-CM

## 2020-02-19 DIAGNOSIS — Z794 Long term (current) use of insulin: Secondary | ICD-10-CM

## 2020-02-19 DIAGNOSIS — I129 Hypertensive chronic kidney disease with stage 1 through stage 4 chronic kidney disease, or unspecified chronic kidney disease: Secondary | ICD-10-CM | POA: Diagnosis not present

## 2020-02-19 DIAGNOSIS — D649 Anemia, unspecified: Secondary | ICD-10-CM | POA: Diagnosis present

## 2020-02-19 DIAGNOSIS — G250 Essential tremor: Secondary | ICD-10-CM | POA: Diagnosis present

## 2020-02-19 DIAGNOSIS — Z7901 Long term (current) use of anticoagulants: Secondary | ICD-10-CM | POA: Diagnosis not present

## 2020-02-19 DIAGNOSIS — T8144XA Sepsis following a procedure, initial encounter: Secondary | ICD-10-CM | POA: Diagnosis not present

## 2020-02-19 DIAGNOSIS — Z9049 Acquired absence of other specified parts of digestive tract: Secondary | ICD-10-CM

## 2020-02-19 LAB — CBC WITH DIFFERENTIAL/PLATELET
Abs Immature Granulocytes: 0.36 10*3/uL — ABNORMAL HIGH (ref 0.00–0.07)
Basophils Absolute: 0.2 10*3/uL — ABNORMAL HIGH (ref 0.0–0.1)
Basophils Relative: 1 %
Eosinophils Absolute: 0.1 10*3/uL (ref 0.0–0.5)
Eosinophils Relative: 0 %
HCT: 40.7 % (ref 39.0–52.0)
Hemoglobin: 11.5 g/dL — ABNORMAL LOW (ref 13.0–17.0)
Immature Granulocytes: 1 %
Lymphocytes Relative: 15 %
Lymphs Abs: 3.9 10*3/uL (ref 0.7–4.0)
MCH: 31.9 pg (ref 26.0–34.0)
MCHC: 28.3 g/dL — ABNORMAL LOW (ref 30.0–36.0)
MCV: 113.1 fL — ABNORMAL HIGH (ref 80.0–100.0)
Monocytes Absolute: 1.2 10*3/uL — ABNORMAL HIGH (ref 0.1–1.0)
Monocytes Relative: 5 %
Neutro Abs: 20.3 10*3/uL — ABNORMAL HIGH (ref 1.7–7.7)
Neutrophils Relative %: 78 %
Platelets: 673 10*3/uL — ABNORMAL HIGH (ref 150–400)
RBC: 3.6 MIL/uL — ABNORMAL LOW (ref 4.22–5.81)
RDW: 16.1 % — ABNORMAL HIGH (ref 11.5–15.5)
Smear Review: NORMAL
WBC: 26 10*3/uL — ABNORMAL HIGH (ref 4.0–10.5)
nRBC: 1.4 % — ABNORMAL HIGH (ref 0.0–0.2)

## 2020-02-19 LAB — COMPREHENSIVE METABOLIC PANEL
ALT: 97 U/L — ABNORMAL HIGH (ref 0–44)
AST: 195 U/L — ABNORMAL HIGH (ref 15–41)
Albumin: 3 g/dL — ABNORMAL LOW (ref 3.5–5.0)
Alkaline Phosphatase: 195 U/L — ABNORMAL HIGH (ref 38–126)
Anion gap: 15 (ref 5–15)
BUN: 64 mg/dL — ABNORMAL HIGH (ref 8–23)
CO2: 24 mmol/L (ref 22–32)
Calcium: 10.1 mg/dL (ref 8.9–10.3)
Chloride: 123 mmol/L — ABNORMAL HIGH (ref 98–111)
Creatinine, Ser: 1.15 mg/dL (ref 0.61–1.24)
GFR calc Af Amer: >60 mL/min (ref >60)
GFR calc non Af Amer: >60 mL/min (ref >60)
Glucose, Bld: 145 mg/dL — ABNORMAL HIGH (ref 70–99)
Potassium: 3.9 mmol/L (ref 3.5–5.1)
Sodium: 162 mmol/L (ref 135–145)
Total Bilirubin: 0.7 mg/dL (ref 0.3–1.2)
Total Protein: 9.3 g/dL — ABNORMAL HIGH (ref 6.5–8.1)

## 2020-02-19 LAB — PROCALCITONIN: Procalcitonin: 0.65 ng/mL

## 2020-02-19 LAB — PROTIME-INR
INR: 1.4 — ABNORMAL HIGH (ref 0.8–1.2)
Prothrombin Time: 17 seconds — ABNORMAL HIGH (ref 11.4–15.2)

## 2020-02-19 LAB — RESPIRATORY PANEL BY RT PCR (FLU A&B, COVID)
Influenza A by PCR: NEGATIVE
Influenza B by PCR: NEGATIVE
SARS Coronavirus 2 by RT PCR: NEGATIVE

## 2020-02-19 LAB — GLUCOSE, CAPILLARY: Glucose-Capillary: 103 mg/dL — ABNORMAL HIGH (ref 70–99)

## 2020-02-19 LAB — APTT: aPTT: 32 seconds (ref 24–36)

## 2020-02-19 LAB — LACTIC ACID, PLASMA
Lactic Acid, Venous: 3.4 mmol/L (ref 0.5–1.9)
Lactic Acid, Venous: 3.9 mmol/L (ref 0.5–1.9)

## 2020-02-19 MED ORDER — SODIUM CHLORIDE 0.9 % IV SOLN
2.0000 g | Freq: Once | INTRAVENOUS | Status: AC
  Administered 2020-02-19: 16:00:00 2 g via INTRAVENOUS
  Filled 2020-02-19: qty 2

## 2020-02-19 MED ORDER — DEXTROSE IN LACTATED RINGERS 5 % IV SOLN: INTRAVENOUS | Status: DC

## 2020-02-19 MED ORDER — MORPHINE SULFATE (PF) 2 MG/ML IV SOLN: 2.0000 mg | Freq: Once | INTRAVENOUS | Status: DC

## 2020-02-19 MED ORDER — VANCOMYCIN HCL 2000 MG/400ML IV SOLN: 2000.0000 mg | Freq: Once | INTRAVENOUS | Status: DC

## 2020-02-19 MED ORDER — INSULIN ASPART 100 UNIT/ML ~~LOC~~ SOLN: 0.0000 [IU] | Freq: Every day | SUBCUTANEOUS | Status: DC

## 2020-02-19 MED ORDER — VANCOMYCIN HCL IN DEXTROSE 1-5 GM/200ML-% IV SOLN: 1000.0000 mg | Freq: Once | INTRAVENOUS | Status: DC

## 2020-02-19 MED ORDER — AMIODARONE HCL 200 MG PO TABS: 100.0000 mg | ORAL_TABLET | Freq: Two times a day (BID) | ORAL | Status: DC

## 2020-02-19 MED ORDER — FAMOTIDINE 20 MG PO TABS
20.0000 mg | ORAL_TABLET | Freq: Every day | ORAL | Status: DC
  Administered 2020-02-20: 09:00:00 20 mg
  Filled 2020-02-19: qty 1

## 2020-02-19 MED ORDER — POLYETHYLENE GLYCOL 3350 17 G PO PACK: 17.0000 g | PACK | Freq: Every day | ORAL | Status: DC

## 2020-02-19 MED ORDER — PRO-STAT SUGAR FREE PO LIQD
30.0000 mL | Freq: Two times a day (BID) | ORAL | Status: DC
  Administered 2020-02-19 – 2020-02-20 (×2): 30 mL

## 2020-02-19 MED ORDER — CHLORHEXIDINE GLUCONATE CLOTH 2 % EX PADS: 6.0000 | MEDICATED_PAD | Freq: Every day | CUTANEOUS | Status: DC

## 2020-02-19 MED ORDER — HYDRALAZINE HCL 50 MG PO TABS: 50.0000 mg | ORAL_TABLET | Freq: Two times a day (BID) | ORAL | Status: DC

## 2020-02-19 MED ORDER — ONDANSETRON HCL 4 MG/2ML IJ SOLN
4.0000 mg | Freq: Four times a day (QID) | INTRAMUSCULAR | Status: DC | PRN
  Administered 2020-02-20: 4 mg via INTRAVENOUS
  Filled 2020-02-19: qty 2

## 2020-02-19 MED ORDER — MORPHINE SULFATE (PF) 2 MG/ML IV SOLN
INTRAVENOUS | Status: AC
  Filled 2020-02-19: qty 1

## 2020-02-19 MED ORDER — CARBIDOPA-LEVODOPA 25-100 MG PO TABS
1.0000 | ORAL_TABLET | Freq: Two times a day (BID) | ORAL | Status: DC
  Administered 2020-02-19 – 2020-02-20 (×2): 1
  Filled 2020-02-19 (×3): qty 1

## 2020-02-19 MED ORDER — APIXABAN 5 MG PO TABS: 5.0000 mg | ORAL_TABLET | Freq: Two times a day (BID) | ORAL | Status: DC

## 2020-02-19 MED ORDER — INSULIN ASPART 100 UNIT/ML ~~LOC~~ SOLN
0.0000 [IU] | Freq: Three times a day (TID) | SUBCUTANEOUS | Status: DC
  Administered 2020-02-20 (×2): 1 [IU] via SUBCUTANEOUS
  Filled 2020-02-19 (×2): qty 1

## 2020-02-19 MED ORDER — AMIODARONE IV BOLUS ONLY 150 MG/100ML
150.0000 mg | Freq: Once | INTRAVENOUS | Status: AC
  Administered 2020-02-19: 150 mg via INTRAVENOUS
  Filled 2020-02-19: qty 100

## 2020-02-19 MED ORDER — IBUPROFEN 100 MG/5ML PO SUSP
600.0000 mg | Freq: Three times a day (TID) | ORAL | Status: DC | PRN
  Administered 2020-02-20: 600 mg
  Filled 2020-02-19 (×4): qty 30

## 2020-02-19 MED ORDER — LINEZOLID 600 MG/300ML IV SOLN
600.0000 mg | Freq: Once | INTRAVENOUS | Status: AC
  Administered 2020-02-19: 600 mg via INTRAVENOUS
  Filled 2020-02-19 (×2): qty 300

## 2020-02-19 MED ORDER — PHENYLEPHRINE CONCENTRATED 100MG/250ML (0.4 MG/ML) INFUSION SIMPLE
0.0000 ug/min | INTRAVENOUS | Status: DC
  Administered 2020-02-20: 33.3333 ug/min via INTRAVENOUS
  Filled 2020-02-19: qty 250

## 2020-02-19 MED ORDER — METRONIDAZOLE IN NACL 5-0.79 MG/ML-% IV SOLN
500.0000 mg | Freq: Once | INTRAVENOUS | Status: AC
  Administered 2020-02-19: 19:00:00 500 mg via INTRAVENOUS
  Filled 2020-02-19: qty 100

## 2020-02-19 MED ORDER — LACTATED RINGERS IV BOLUS
1000.0000 mL | Freq: Once | INTRAVENOUS | Status: AC
  Administered 2020-02-19: 1000 mL via INTRAVENOUS

## 2020-02-19 MED ORDER — VANCOMYCIN HCL 2000 MG/400ML IV SOLN
2000.0000 mg | Freq: Once | INTRAVENOUS | Status: AC
  Administered 2020-02-19: 23:00:00 2000 mg via INTRAVENOUS
  Filled 2020-02-19: qty 400

## 2020-02-19 MED ORDER — CHLORHEXIDINE GLUCONATE CLOTH 2 % EX PADS
6.0000 | MEDICATED_PAD | Freq: Every day | CUTANEOUS | Status: DC
  Administered 2020-02-19: 6 via TOPICAL

## 2020-02-19 MED ORDER — DILTIAZEM HCL 30 MG PO TABS: 30.0000 mg | ORAL_TABLET | Freq: Four times a day (QID) | ORAL | Status: DC

## 2020-02-19 MED ORDER — JUVEN PO PACK
1.0000 | PACK | Freq: Two times a day (BID) | ORAL | Status: DC
  Administered 2020-02-20: 11:00:00 1

## 2020-02-19 MED ORDER — VITAMIN B-12 1000 MCG PO TABS
2000.0000 ug | ORAL_TABLET | Freq: Every day | ORAL | Status: DC
  Administered 2020-02-20: 2000 ug
  Filled 2020-02-19: qty 2

## 2020-02-19 MED ORDER — THIAMINE HCL 100 MG PO TABS
200.0000 mg | ORAL_TABLET | Freq: Every day | ORAL | Status: DC
  Administered 2020-02-20: 200 mg
  Filled 2020-02-19: qty 2

## 2020-02-19 MED ORDER — SODIUM CHLORIDE 0.9 % IV SOLN: INTRAVENOUS | Status: DC

## 2020-02-19 MED ORDER — BENZTROPINE MESYLATE 0.5 MG PO TABS
0.5000 mg | ORAL_TABLET | Freq: Two times a day (BID) | ORAL | Status: DC
  Administered 2020-02-19 – 2020-02-20 (×2): 0.5 mg
  Filled 2020-02-19 (×3): qty 1

## 2020-02-19 MED ORDER — SODIUM CHLORIDE 0.9 % IV BOLUS
1000.0000 mL | Freq: Once | INTRAVENOUS | Status: AC
  Administered 2020-02-19: 1000 mL via INTRAVENOUS

## 2020-02-19 MED ORDER — LACTATED RINGERS IV BOLUS
1000.0000 mL | Freq: Once | INTRAVENOUS | Status: AC
  Administered 2020-02-19: 16:00:00 1000 mL via INTRAVENOUS

## 2020-02-19 MED ORDER — VANCOMYCIN HCL 1750 MG/350ML IV SOLN: 1750.0000 mg | INTRAVENOUS | Status: DC

## 2020-02-19 MED ORDER — SODIUM CHLORIDE 0.9 % IV SOLN
2.0000 g | INTRAVENOUS | Status: DC
  Administered 2020-02-19: 2 g via INTRAVENOUS
  Filled 2020-02-19 (×2): qty 20

## 2020-02-19 MED ORDER — ACETAMINOPHEN 160 MG/5ML PO SOLN
650.0000 mg | Freq: Four times a day (QID) | ORAL | Status: DC | PRN
  Filled 2020-02-19: qty 20.3

## 2020-02-19 MED ORDER — ONDANSETRON HCL 4 MG PO TABS: 4.0000 mg | ORAL_TABLET | Freq: Four times a day (QID) | ORAL | Status: DC | PRN

## 2020-02-19 MED ORDER — METRONIDAZOLE 500 MG PO TABS
500.0000 mg | ORAL_TABLET | Freq: Three times a day (TID) | ORAL | Status: DC
  Administered 2020-02-19 – 2020-02-20 (×2): 500 mg via ORAL
  Filled 2020-02-19 (×4): qty 1

## 2020-02-19 MED ORDER — MORPHINE SULFATE (PF) 2 MG/ML IV SOLN
1.0000 mg | INTRAVENOUS | Status: DC | PRN
  Administered 2020-02-19 – 2020-02-20 (×2): 2 mg via INTRAVENOUS
  Filled 2020-02-19 (×2): qty 1

## 2020-02-19 MED ORDER — AMIODARONE HCL 200 MG PO TABS
100.0000 mg | ORAL_TABLET | Freq: Two times a day (BID) | ORAL | Status: DC
  Administered 2020-02-20: 09:00:00 100 mg
  Filled 2020-02-19: qty 1

## 2020-02-19 MED ORDER — MORPHINE SULFATE (PF) 2 MG/ML IV SOLN
2.0000 mg | Freq: Once | INTRAVENOUS | Status: AC
  Administered 2020-02-19: 2 mg via INTRAVENOUS

## 2020-02-19 MED ORDER — FREE WATER
200.0000 mL | Status: DC
  Administered 2020-02-19 – 2020-02-20 (×2): 200 mL
  Administered 2020-02-20: 150 mL
  Administered 2020-02-20: 200 mL

## 2020-02-19 MED ORDER — METOPROLOL TARTRATE 25 MG PO TABS: 12.5000 mg | ORAL_TABLET | Freq: Two times a day (BID) | ORAL | Status: DC

## 2020-02-19 MED ORDER — VANCOMYCIN HCL IN DEXTROSE 1-5 GM/200ML-% IV SOLN
1000.0000 mg | Freq: Once | INTRAVENOUS | Status: AC
  Filled 2020-02-19: qty 200

## 2020-02-19 NOTE — ED Notes (Signed)
Sherie,RN provided wound care. This tech and [facility] provided inc care. Dry brief and placed a male external device(purewick).

## 2020-02-19 NOTE — Progress Notes (Signed)
Pharmacy Antibiotic Note  Kyle [name] is a 74 y.o. male admitted on 02/19/2020. Patient recently discharged 3/25 after admission with sacral wound. Patient was taken to the OR four times during stay for debridement of wound as well as left hip and leg. Wounds extending to bone. Cultures grew out VRE, candida and bacteroides. Patient was on daptomycin in the hospital but was discharged on ceftriaxone, doxycycline and metronidazole. He was unable to go out on appropriate antibiotic for the VRE as daptomycin and linezolid were cost prohibitive to the SNF. Patient presents with wound VAC in place. Pharmacy has been consulted for vancomycin dosing.  Plan: Previously spoke with EDP who expressed concern for previous h/o VRE. Agreed on one time dose of linezolid tonight with recommendation for ID involvement in the morning to determine need for continuation of linezolid.  Vancomycin 2000 mg IV x 1. Will tentatively plan to follow with vancomycin 1750 mg IV q24h to start 3/30 ~ 2100. Based on previous stay and SCr ~ 0.3-0.5, patient with possible AKI. Will follow up renal function in the morning and reassess need for possible adjustment of vancomycin dosing if plan to continue. Goal AUC 400-550 Expected AUC: 493 SCr used: 1.15   Height: 6' (182.9 cm) Weight: 189 lb 9.5 oz (86 kg) IBW/kg (Calculated) : 77.6  Temp (24hrs), Avg:102 F (38.9 C), Min:102 F (38.9 C), Max:102 F (38.9 C)  Recent Labs  Lab 02/13/20 0436 02/14/20 0440 02/15/20 0520 02/19/20 1606  WBC 9.0 8.8 9.8 26.0*  CREATININE 0.34* 0.36* <0.30* 1.15  LATICACIDVEN  --   --   --  3.9*  3.4*    Estimated Creatinine Clearance: 63.7 mL/min (by C-G formula based on SCr of 1.15 mg/dL).    No Known Allergies  Antimicrobials this admission: Linezolid 3/29 x 1 Vancomycin 3/29 >>  Dose adjustments this admission: NA  Microbiology results: 3/29 BCx: pending  Thank you for allowing pharmacy to be a part of this patient's  care.  Pricilla Riffle, PharmD 02/19/2020 7:51 PM
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
## 2020-02-19 NOTE — Progress Notes (Signed)
Bedside RN Toma Copier confirmed that MD was aware of increase in [place]
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
## 2020-02-19 NOTE — Progress Notes (Signed)
PHARMACY -  BRIEF ANTIBIOTIC NOTE   Pharmacy has received consult(s) for vancomycin and cefepime from an ED provider.  The patient's profile has been reviewed for ht/wt/allergies/indication/available labs.    One time order(s) placed for vanc 1 g + cefepime 2 g  Further antibiotics/pharmacy consults should be ordered by admitting physician if indicated.                       Thank you,  Pricilla Riffle, PharmD 02/19/2020  3:39 PM
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
## 2020-02-19 NOTE — ED Notes (Signed)
Sister to the bedside. Sister updated on pt status. Pt now acknowledging staff presence but is still nonverbal. Resp rate and sats have improved. BP stable.

## 2020-02-19 NOTE — Progress Notes (Signed)
CODE SEPSIS - PHARMACY COMMUNICATION  **Broad Spectrum Antibiotics should be administered within 1 hour of Sepsis diagnosis**  Time Code Sepsis Called/Page Received: 1537  Antibiotics Ordered: vanc/cefepime/metronidazole  Time of 1st antibiotic administration: 1613  Additional action taken by pharmacy: NA  If necessary, Name of Provider/Nurse Contacted: NA    Pricilla Riffle ,PharmD Clinical Pharmacist  02/19/2020  4:14 PM

## 2020-02-19 NOTE — Progress Notes (Signed)
Dr. Mikeal Hawthorne responded to my secure chat regarding increase in LA, extra fluids were ordered

## 2020-02-19 NOTE — ED Triage Notes (Signed)
Pt to the er via ems from [place] for health care, pt was at PT and oted a high heart rate and decreased loc. Heart rate 148 with ems, sinus tach, bp 109/66, o2 level 99% on 2L and room air,

## 2020-02-20 ENCOUNTER — Other Ambulatory Visit: Payer: Self-pay

## 2020-02-20 ENCOUNTER — Inpatient Hospital Stay: Payer: Medicare Other

## 2020-02-20 DIAGNOSIS — I959 Hypotension, unspecified: Secondary | ICD-10-CM

## 2020-02-20 DIAGNOSIS — N179 Acute kidney failure, unspecified: Secondary | ICD-10-CM

## 2020-02-20 DIAGNOSIS — G9341 Metabolic encephalopathy: Secondary | ICD-10-CM

## 2020-02-20 DIAGNOSIS — N189 Chronic kidney disease, unspecified: Secondary | ICD-10-CM

## 2020-02-20 DIAGNOSIS — Z8673 Personal history of transient ischemic attack (TIA), and cerebral infarction without residual deficits: Secondary | ICD-10-CM

## 2020-02-20 DIAGNOSIS — I129 Hypertensive chronic kidney disease with stage 1 through stage 4 chronic kidney disease, or unspecified chronic kidney disease: Secondary | ICD-10-CM

## 2020-02-20 DIAGNOSIS — L89224 Pressure ulcer of left hip, stage 4: Secondary | ICD-10-CM

## 2020-02-20 DIAGNOSIS — L89154 Pressure ulcer of sacral region, stage 4: Secondary | ICD-10-CM

## 2020-02-20 DIAGNOSIS — R6521 Severe sepsis with septic shock: Secondary | ICD-10-CM

## 2020-02-20 DIAGNOSIS — Z7189 Other specified counseling: Secondary | ICD-10-CM

## 2020-02-20 DIAGNOSIS — Z86711 Personal history of pulmonary embolism: Secondary | ICD-10-CM

## 2020-02-20 DIAGNOSIS — I482 Chronic atrial fibrillation, unspecified: Secondary | ICD-10-CM

## 2020-02-20 DIAGNOSIS — A419 Sepsis, unspecified organism: Principal | ICD-10-CM

## 2020-02-20 DIAGNOSIS — L98424 Non-pressure chronic ulcer of back with necrosis of bone: Secondary | ICD-10-CM

## 2020-02-20 DIAGNOSIS — E1122 Type 2 diabetes mellitus with diabetic chronic kidney disease: Secondary | ICD-10-CM

## 2020-02-20 DIAGNOSIS — E870 Hyperosmolality and hypernatremia: Secondary | ICD-10-CM

## 2020-02-20 DIAGNOSIS — Z515 Encounter for palliative care: Secondary | ICD-10-CM

## 2020-02-20 DIAGNOSIS — Z8616 Personal history of COVID-19: Secondary | ICD-10-CM

## 2020-02-20 DIAGNOSIS — Z931 Gastrostomy status: Secondary | ICD-10-CM

## 2020-02-20 DIAGNOSIS — T8144XA Sepsis following a procedure, initial encounter: Secondary | ICD-10-CM

## 2020-02-20 LAB — GLUCOSE, CAPILLARY
Glucose-Capillary: 149 mg/dL — ABNORMAL HIGH (ref 70–99)
Glucose-Capillary: 144 mg/dL — ABNORMAL HIGH (ref 70–99)

## 2020-02-20 LAB — CBC
HCT: 31 % — ABNORMAL LOW (ref 39.0–52.0)
Hemoglobin: 8.7 g/dL — ABNORMAL LOW (ref 13.0–17.0)
MCH: 32.2 pg (ref 26.0–34.0)
MCHC: 28.1 g/dL — ABNORMAL LOW (ref 30.0–36.0)
MCV: 114.8 fL — ABNORMAL HIGH (ref 80.0–100.0)
Platelets: 430 10*3/uL — ABNORMAL HIGH (ref 150–400)
RBC: 2.7 MIL/uL — ABNORMAL LOW (ref 4.22–5.81)
RDW: 16 % — ABNORMAL HIGH (ref 11.5–15.5)
WBC: 22.4 10*3/uL — ABNORMAL HIGH (ref 4.0–10.5)
nRBC: 0.6 % — ABNORMAL HIGH (ref 0.0–0.2)

## 2020-02-20 LAB — LACTIC ACID, PLASMA
Lactic Acid, Venous: 4.6 mmol/L (ref 0.5–1.9)
Lactic Acid, Venous: 3 mmol/L (ref 0.5–1.9)

## 2020-02-20 LAB — COMPREHENSIVE METABOLIC PANEL
ALT: 88 U/L — ABNORMAL HIGH (ref 0–44)
AST: 160 U/L — ABNORMAL HIGH (ref 15–41)
Albumin: 2.8 g/dL — ABNORMAL LOW (ref 3.5–5.0)
Alkaline Phosphatase: 172 U/L — ABNORMAL HIGH (ref 38–126)
Anion gap: 16 — ABNORMAL HIGH (ref 5–15)
BUN: 64 mg/dL — ABNORMAL HIGH (ref 8–23)
CO2: 19 mmol/L — ABNORMAL LOW (ref 22–32)
Calcium: 8.9 mg/dL (ref 8.9–10.3)
Chloride: 123 mmol/L — ABNORMAL HIGH (ref 98–111)
Creatinine, Ser: 1.25 mg/dL — ABNORMAL HIGH (ref 0.61–1.24)
GFR calc Af Amer: >60 mL/min (ref >60)
GFR calc non Af Amer: 57 mL/min — ABNORMAL LOW (ref >60)
Glucose, Bld: 161 mg/dL — ABNORMAL HIGH (ref 70–99)
Potassium: 4.7 mmol/L (ref 3.5–5.1)
Sodium: 158 mmol/L — ABNORMAL HIGH (ref 135–145)
Total Bilirubin: 0.9 mg/dL (ref 0.3–1.2)
Total Protein: 8 g/dL (ref 6.5–8.1)

## 2020-02-20 LAB — TROPONIN I (HIGH SENSITIVITY)
Troponin I (High Sensitivity): 78 ng/L — ABNORMAL HIGH (ref <18)
Troponin I (High Sensitivity): 76 ng/L — ABNORMAL HIGH (ref <18)
Troponin I (High Sensitivity): 59 ng/L — ABNORMAL HIGH (ref <18)

## 2020-02-20 LAB — URINALYSIS, COMPLETE (UACMP) WITH MICROSCOPIC
Bacteria, UA: NONE SEEN
Bilirubin Urine: NEGATIVE
Glucose, UA: NEGATIVE mg/dL
Hgb urine dipstick: NEGATIVE
Ketones, ur: NEGATIVE mg/dL
Nitrite: NEGATIVE
Protein, ur: 100 mg/dL — AB
Specific Gravity, Urine: 1.012 (ref 1.005–1.030)
pH: 6 (ref 5.0–8.0)

## 2020-02-20 LAB — PROCALCITONIN: Procalcitonin: 8.27 ng/mL

## 2020-02-20 LAB — PROTIME-INR
INR: 1.4 — ABNORMAL HIGH (ref 0.8–1.2)
Prothrombin Time: 17.4 seconds — ABNORMAL HIGH (ref 11.4–15.2)

## 2020-02-20 LAB — C DIFFICILE QUICK SCREEN W PCR REFLEX
C Diff antigen: NEGATIVE
C Diff interpretation: NOT DETECTED
C Diff toxin: NEGATIVE

## 2020-02-20 LAB — URINE CULTURE: Culture: NO GROWTH

## 2020-02-20 LAB — CORTISOL-AM, BLOOD: Cortisol - AM: 31.2 ug/dL — ABNORMAL HIGH (ref 6.7–22.6)

## 2020-02-20 MED ORDER — HALOPERIDOL LACTATE 2 MG/ML PO CONC
0.5000 mg | ORAL | Status: DC | PRN
  Filled 2020-02-20: qty 0.3

## 2020-02-20 MED ORDER — GLYCOPYRROLATE 0.2 MG/ML IJ SOLN: 0.2000 mg | INTRAMUSCULAR | Status: DC | PRN

## 2020-02-20 MED ORDER — BIOTENE DRY MOUTH MT LIQD: 15.0000 mL | OROMUCOSAL | Status: DC | PRN

## 2020-02-20 MED ORDER — GLYCOPYRROLATE 1 MG PO TABS
1.0000 mg | ORAL_TABLET | ORAL | Status: DC | PRN
  Filled 2020-02-20: qty 1

## 2020-02-20 MED ORDER — POLYVINYL ALCOHOL 1.4 % OP SOLN
1.0000 [drp] | Freq: Four times a day (QID) | OPHTHALMIC | Status: DC | PRN
  Filled 2020-02-20: qty 15

## 2020-02-20 MED ORDER — ACETAMINOPHEN 325 MG PO TABS: 650.0000 mg | ORAL_TABLET | Freq: Four times a day (QID) | ORAL | Status: DC | PRN

## 2020-02-20 MED ORDER — HALOPERIDOL 0.5 MG PO TABS
0.5000 mg | ORAL_TABLET | ORAL | Status: DC | PRN
  Filled 2020-02-20: qty 1

## 2020-02-20 MED ORDER — LACTATED RINGERS IV BOLUS
500.0000 mL | Freq: Once | INTRAVENOUS | Status: AC
  Administered 2020-02-20: 02:00:00 500 mL via INTRAVENOUS

## 2020-02-20 MED ORDER — VANCOMYCIN HCL 1250 MG/250ML IV SOLN
1250.0000 mg | INTRAVENOUS | Status: DC
  Filled 2020-02-20: qty 250

## 2020-02-20 MED ORDER — ONDANSETRON HCL 4 MG/2ML IJ SOLN: 4.0000 mg | Freq: Four times a day (QID) | INTRAMUSCULAR | Status: DC | PRN

## 2020-02-20 MED ORDER — ONDANSETRON 4 MG PO TBDP
4.0000 mg | ORAL_TABLET | Freq: Four times a day (QID) | ORAL | Status: DC | PRN
  Filled 2020-02-20: qty 1

## 2020-02-20 MED ORDER — LINEZOLID 600 MG PO TABS
600.0000 mg | ORAL_TABLET | Freq: Two times a day (BID) | ORAL | Status: DC
  Administered 2020-02-20: 600 mg via ORAL
  Filled 2020-02-20 (×2): qty 1

## 2020-02-20 MED ORDER — LACTATED RINGERS IV BOLUS: 500.0000 mL | Freq: Once | INTRAVENOUS | Status: DC

## 2020-02-20 MED ORDER — HALOPERIDOL LACTATE 5 MG/ML IJ SOLN: 0.5000 mg | INTRAMUSCULAR | Status: DC | PRN

## 2020-02-20 MED ORDER — MORPHINE SULFATE (PF) 2 MG/ML IV SOLN: 2.0000 mg | INTRAVENOUS | Status: DC | PRN

## 2020-02-20 MED ORDER — ACETAMINOPHEN 650 MG RE SUPP: 650.0000 mg | Freq: Four times a day (QID) | RECTAL | Status: DC | PRN
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
## 2020-02-20 NOTE — Plan of Care (Signed)
Pt was admitted from ED last night, pt is lethargic but easily arousable, will saponaceously open eyes but will not verbalized any words, will attempt t move mouth at but without forming any words. Pt will track staff with his eyes, response to  pain stimulation. Pupils are equal and reactive to light. Pt has received multiples antibiotic's and multiple fluid boluses with improvement in BP. Amiodarone bolus give for increase HR of 140's early, HR low 110's now. Pt was started on Phenylephrine gtt with improve in BP.  Pt was admitted with large decubitus ulcer to sacral, about 16 inches by 10 inches, tunneling and extending to her coccyx , left hip  decubitus ulcer tunneling with hip bone and left buttock tunneling decubitus ulcer.  Pt has a peg tube prior to admission with 200 mls water bolus every 4hrs. Foley 28f was place this morning , residual post void/bladder scan. . returned clear yellow urine.  Pt sister was updated last night and her name is Elpidio Anis [phone]. She said pt has a estranged daughter Maryruth Hancock but stated she  Elpidio Anis is the discussion maker for pt .

## 2020-02-20 NOTE — Progress Notes (Addendum)
Palliative: Mr. Athena Masse was seen by inpatient palliative team 02/01/2020.  Mr. Athena Masse is resting quietly in bed.  He appears acutely/chronically ill and frail.  He is able to make his basic needs known, but there is no family at bedside at this time.  Conference with attending and bedside nursing staff related to patient condition, needs.  Contact from bedside nursing staff later stating that family is present she shares that meeting with healthcare surrogate, sister [name].  We talked about Mr. Wamser's acute and chronic health problems, his decline over the last few months, few weeks in particular.  We also talked about his nonhealing wound.  [name] states that she would like to focus on comfort and dignity, let nature take its course.  We talked about comfort care and symptom management.  We talked about the benefits of residential hospice, what this care looks like.  [name] states that she is experienced with people receiving care in residential hospice.  She asks appropriate questions, sharing that she would like time to consider what is best for her brother.  All questions answered. Symptom management orders added.   Conference with attending and bedside nursing staff related to patient condition, needs, goals of care, residential hospice discussion.  Plan:   Full comfort care when family arrives tomorrow.  Considering transfer to residential hospice.     35 minutes Lillia Carmel, NP Palliative Medicine Team Team Phone # [phone] Greater than 50% of this time was spent counseling and coordinating care related to the above assessment and plan.

## 2020-02-20 NOTE — Plan of Care (Signed)
  Problem: Clinical Measurements: Goal: Ability to maintain clinical measurements within normal limits will improve Outcome: Not Progressing Goal: Will remain free from infection Outcome: Not Progressing Goal: Diagnostic test results will improve Outcome: Not Progressing   Problem: Activity: Goal: Risk for activity intolerance will decrease Outcome: Not Progressing   Problem: Nutrition: Goal: Adequate nutrition will be maintained Outcome: Not Progressing   Problem: Pain Managment: Goal: General experience of comfort will improve Outcome: Not Progressing   Problem: Safety: Goal: Ability to remain free from injury will improve Outcome: Not Progressing   Problem: Skin Integrity: Goal: Risk for impaired skin integrity will decrease Outcome: Not Progressing
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
## 2020-02-20 NOTE — Progress Notes (Addendum)
Shift summary:  - Patient began regurgitating water from his mouth following administration of 150 mL of 200 mL FWF. No obvious distress right away.   - Patient is now "comfort measures only".

## 2020-02-20 NOTE — Progress Notes (Signed)
PHARMACY - PHYSICIAN COMMUNICATION CRITICAL VALUE ALERT - BLOOD CULTURE IDENTIFICATION (BCID)  No results found for this or any previous visit.  Name of physician (or Provider) Contacted:  None, pt on comfort care   Changes to prescribed antibiotics required: no  Kyle [name] 02/20/2020  9:15 PM

## 2020-02-20 NOTE — Consult Note (Addendum)
WOC Nurse Consult Note: Reason for Consult: Consult requested for several wounds; pt is familiar to the WOC team from a recent visit.  They had a Vac dressing applied at the SNF to left trochanter and sacrum.  Assessed wounds with ID and surgical PA teams at the bedside.  Pt has a Flexiseal in place to attempt to contain stool, but it is still leaking a significant amount of liquid stool around the insertion site and it would be extremely difficult to maintain a seal if the Vac was replaced.  Wound type: Left trochanter with chronic stage 4 pressure injury; 8X9X4cm with tunneling at 12:00 o'clock to 6 cm; wound is 60% red, 40% yellow, bone palpable, mod amt yellow drainage, no odor Left buttock with chronic stage 3 pressure injury; 1.8X1X.5cm, red and moist, mod amt yellow drainage, no odor Sacrum with chronic stage 4 pressure injury; 18X12X4cm; undermining to 4 cm at 9:00 o'clock, exposed bone, 85% red, 15% yellow, mod amt tan drainage, no odor Pressure Injury POA: Yes Dressing procedure/placement/frequency: Pt is on a low air loss mattress to reduce pressure. Topical treatment orders provided for bedside nurses to perform as follows: Apply moist gauze packing to sacrum and left buttock wounds Q day.  Cover sacrum with foam dressing and change Q 3 days or PRN soiling.  Cover buttock wound with Tegaderm. WOC team will plan to change left trochanter Vac dressing again on Fri. Cammie Mcgee MSN, RN, CWOCN, [place], CNS [phone]

## 2020-02-20 NOTE — Progress Notes (Signed)
Left upper arm single lumen PICC appears to be looped in th svc or azygous vein. Port power flushed x's 4. Will leave head of bed in upright position for 30 min.
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
## 2020-02-20 NOTE — Progress Notes (Signed)
Patient is currently followed by [employer] community Palliative program at [facility]. TOC Meagan Hagwood made aware. Thank you. Dayna Barker BSN, RN, [facility] [employer] [phone]

## 2020-02-20 NOTE — Progress Notes (Signed)
Initial Nutrition Assessment  DOCUMENTATION CODES:   Not applicable  INTERVENTION:  Noted patient will now be transitioning to comfort care. No further nutrition interventions warranted at this time. Please consult RD as needed.  NUTRITION DIAGNOSIS:   Increased nutrient needs related to wound healing as evidenced by estimated needs.  GOAL:   Provide needs based on ASPEN/SCCM guidelines  MONITOR:   Labs, Weight trends, TF tolerance, Skin, I & O's  REASON FOR ASSESSMENT:   Malnutrition Screening Tool    ASSESSMENT:   74 year old male with PMHx of bipolar 1 disorder, HTN, DM type 2, HLD, hx COVID 11/02/2019 requiring trach and PEG (now s/p decannulation), hx PE on Xarelto, chronic tremors of bilateral hands, CKD, neuropathy, A-fib, COPD, Parkinson's disease, necrotic sacral and left trochanteric decubitus wounds that required multiple debridements in 01/2020 who was discharged on 3/25 to [facility] now admitted with AMS, sepsis.   Met with patient at bedside but he was unable to provide any history. He is known to this RD from previous admission. Patient coming from [facility] but they did not send over what his most recent tube feed regimen was. When patient was last at [facility] a couple weeks ago he was on Jevity 1.5 Cal at 70 mL/hr + Pro-Stat 30 mL BID + Juven BID and was tolerating well.  Patient was 113.2 kg on 08/30/2019. He is now 85.4 kg (188.27 lbs). He has lost 27.8 kg (24.5% body weight) over the past almost 6 months, which is significant for time frame. However, unsure if weight history is correct as it is difficult to weigh patient at this time.  Enteral Access: G-tube  Medications reviewed and include: famotidine, Novolog 0-9 units TID, Novolog 0-5 units QHS, Flagyl, Miralax, thiamine 200 mg daily per tube, vitamin B12 2000 micrograms daily per tube, ceftriaxone, D5W at 125 mL/hr, phenylephrine gtt at 30 mcg/min.  Labs reviewed: CBG 144-149, Sodium 158,  Chloride 123, CO2 19, BUN 64, Creatinine 1.25.  Discussed with RN and on rounds. Plan is to hold off on tube feeds today pending plan for surgery. Patient is NPO for now.  Following RD assessment this morning orders have now been placed for comfort measures.  NUTRITION - FOCUSED PHYSICAL EXAM:    Most Recent Value  Orbital Region  No depletion  Upper Arm Region  No depletion  Thoracic and Lumbar Region  No depletion  Buccal Region  No depletion  Temple Region  No depletion  Clavicle Bone Region  No depletion  Clavicle and Acromion Bone Region  Mild depletion  Scapular Bone Region  Unable to assess  Dorsal Hand  No depletion  Patellar Region  Mild depletion  Anterior Thigh Region  Mild depletion  Posterior Calf Region  Moderate depletion  Edema (RD Assessment)  None  Hair  Reviewed  Eyes  Unable to assess  Mouth  Unable to assess  Skin  Reviewed  Nails  Reviewed     Diet Order:   Diet Order            Diet regular Room service appropriate? Yes; Fluid consistency: Thin  Diet effective now             EDUCATION NEEDS:   No education needs have been identified at this time  Skin:  Skin Assessment: Skin Integrity Issues: Skin Integrity Issues:: Stage IV, Stage III Stage III: Left buttock with chronic stage 3 pressure injury; 1.8X1X.5cm, red and moist, mod amt yellow drainage, no odor  Stage IV: Left trochanter with chronic stage 4 pressure injury; 8X9X4cm with tunneling at 12:00 o'clock to 6 cm; wound is 60% red, 40% yellow, bone palpable, mod amt yellow drainage, no odor;  Sacrum with chronic stage 4 pressure injury; 18X12X4cm; undermining to 4 cm at 9:00 o'clock, exposed bone, 85% red, 15% yellow, mod amt tan drainage, no odor  Last BM:  Unknown  Height:   Ht Readings from Last 1 Encounters:  02/19/20 6' (1.829 m)   Weight:   Wt Readings from Last 1 Encounters:  02/19/20 85.4 kg   Ideal Body Weight:  80.9 kg  BMI:  Body mass index is 25.53 kg/m.  Estimated  Nutritional Needs:   Kcal:  2820-2990 (33-35 kcal/kg)  Protein:  128-154 grams (1.5-1.8 grams/kg)  Fluid:  2.2-2.6 L/day  Jacklynn Barnacle, MS, RD, LDN Pager number available on Amion

## 2020-02-21 DIAGNOSIS — L98424 Non-pressure chronic ulcer of back with necrosis of bone: Secondary | ICD-10-CM

## 2020-02-21 MED ORDER — ONDANSETRON HCL 4 MG/2ML IJ SOLN: 4.0000 mg | Freq: Four times a day (QID) | INTRAMUSCULAR | 0 refills | Status: AC | PRN

## 2020-02-21 MED ORDER — ACETAMINOPHEN 650 MG RE SUPP: 650.0000 mg | Freq: Four times a day (QID) | RECTAL | 0 refills | Status: AC | PRN

## 2020-02-21 MED ORDER — ACETAMINOPHEN 325 MG PO TABS: 650.0000 mg | ORAL_TABLET | Freq: Four times a day (QID) | ORAL | Status: AC | PRN

## 2020-02-21 MED ORDER — GLYCOPYRROLATE 0.2 MG/ML IJ SOLN: 0.2000 mg | INTRAMUSCULAR | Status: AC | PRN

## 2020-02-21 MED ORDER — POLYVINYL ALCOHOL 1.4 % OP SOLN: 1.0000 [drp] | Freq: Four times a day (QID) | OPHTHALMIC | 0 refills | Status: AC | PRN

## 2020-02-21 MED ORDER — HALOPERIDOL LACTATE 2 MG/ML PO CONC: 0.5000 mg | ORAL | 0 refills | Status: AC | PRN

## 2020-02-21 MED ORDER — BIOTENE DRY MOUTH MT LIQD: 15.0000 mL | OROMUCOSAL | Status: AC | PRN

## 2020-02-21 MED ORDER — ONDANSETRON 4 MG PO TBDP: 4.0000 mg | ORAL_TABLET | Freq: Four times a day (QID) | ORAL | 0 refills | Status: AC | PRN

## 2020-02-21 MED ORDER — MORPHINE SULFATE (PF) 2 MG/ML IV SOLN: 2.0000 mg | INTRAVENOUS | 0 refills | Status: AC | PRN

## 2020-02-21 MED ORDER — GLYCOPYRROLATE 1 MG PO TABS: 1.0000 mg | ORAL_TABLET | ORAL | Status: AC | PRN

## 2020-02-21 MED ORDER — HALOPERIDOL LACTATE 5 MG/ML IJ SOLN: 0.5000 mg | INTRAMUSCULAR | Status: AC | PRN

## 2020-02-21 MED ORDER — HALOPERIDOL 0.5 MG PO TABS: 0.5000 mg | ORAL_TABLET | ORAL | Status: AC | PRN

## 2020-02-21 NOTE — Discharge Summary (Signed)
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 [place], [place] [address]   Blood Culture (routine x 2)     Status: None (Preliminary result)   Collection Time: 02/19/20  4:06 PM   Specimen: BLOOD  Result Value Ref Range  Status   Specimen Description BLOOD LEFT ANTECUBITAL  Final   Special Requests   Final     BOTTLES DRAWN AEROBIC AND ANAEROBIC Blood Culture adequate volume   Culture   Final    NO GROWTH 2 DAYS Performed at [facility], [address] [place], [place] [address]    Report Status PENDING  Incomplete  Blood Culture (routine x 2)     Status: Abnormal   Collection Time: 02/19/20  4:06 PM   Specimen: BLOOD  Result Value Ref Range Status   Specimen Description   Final    BLOOD BLOOD LEFT ARM Performed at [facility], [address] [place], [place] [address]    Special Requests   Final    BOTTLES DRAWN AEROBIC AND ANAEROBIC Blood Culture results may not be optimal due to an inadequate volume of blood received in culture bottles Performed at [facility], [address] [place], [place] [address]    Culture  Setup Time   Final    GRAM POSITIVE COCCI AEROBIC BOTTLE ONLY CRITICAL RESULT CALLED TO, READ BACK BY AND VERIFIED WITH: MICHAEL SIMPSON AT 2040 ON 02/20/20 RWW Performed at [facility], [address] [place], [place] [address]    Culture (A)  Final    STAPHYLOCOCCUS SPECIES (COAGULASE NEGATIVE) THE SIGNIFICANCE OF ISOLATING THIS ORGANISM FROM A SINGLE SET OF BLOOD CULTURES WHEN MULTIPLE SETS ARE DRAWN IS UNCERTAIN. PLEASE NOTIFY THE MICROBIOLOGY DEPARTMENT WITHIN ONE WEEK IF SPECIATION AND SENSITIVITIES ARE REQUIRED. Performed at [facility], [address] [place], [place] [address]    Report Status 02/21/2020 FINAL  Final  Respiratory Panel by RT PCR (Flu A&B, Covid) - Nasopharyngeal Swab     Status: None   Collection Time: 02/19/20  4:06 PM   Specimen: Nasopharyngeal Swab  Result Value Ref Range Status   SARS Coronavirus 2 by RT PCR NEGATIVE NEGATIVE Final    Comment: (NOTE) SARS-CoV-2 target nucleic acids are NOT DETECTED. The SARS-CoV-2 RNA is generally detectable in upper respiratoy specimens during the acute phase of infection. The lowest concentration of SARS-CoV-2 viral copies this assay can detect  is 131 copies/mL. A negative result does not preclude SARS-Cov-2 infection and should not be used as the sole basis for treatment or other patient management decisions. A negative result may occur with  improper specimen collection/handling, submission of specimen other than nasopharyngeal swab, presence of viral mutation(s) within the areas targeted by this assay, and inadequate number of viral copies (<131 copies/mL). A negative result must be combined with clinical observations, patient history, and epidemiological information. The expected result is Negative. Fact Sheet for Patients:  https://www.moore.com/ Fact Sheet for Healthcare Providers:  https://www.young.biz/ This test is not yet ap proved or cleared by the [place] FDA and  has been authorized for detection and/or diagnosis of SARS-CoV-2 by FDA under an Emergency Use Authorization (EUA). This EUA will remain  in effect (meaning this test can be used) for the duration of the COVID-19 declaration under Section 564(b)(1) of the Act, 21 U.S.C. section 360bbb-3(b)(1), unless the authorization is terminated or revoked sooner.    Influenza A by PCR NEGATIVE NEGATIVE Final   Influenza B by PCR NEGATIVE NEGATIVE Final    Comment: (NOTE) The Xpert Xpress SARS-CoV-2/FLU/RSV assay is intended as an aid in  the diagnosis of influenza from Nasopharyngeal swab specimens and  should not be used as a sole basis for treatment. Nasal washings and  aspirates are unacceptable  for Xpert Xpress SARS-CoV-2/FLU/RSV  testing. Fact Sheet for Patients: https://www.moore.com/ Fact Sheet for Healthcare Providers: https://www.young.biz/ This test is not yet approved or cleared by the [place] FDA and  has been authorized for detection and/or diagnosis of SARS-CoV-2 by  FDA under an Emergency Use Authorization (EUA). This EUA will remain  in effect (meaning this  test can be used) for the duration of the  Covid-19 declaration under Section 564(b)(1) of the Act, 21  U.S.C. section 360bbb-3(b)(1), unless the authorization is  terminated or revoked. Performed at [facility], [address], [place], [place] [address]   Urine culture     Status: None   Collection Time: 02/20/20  2:20 AM   Specimen: Urine, Catheterized  Result Value Ref Range Status   Specimen Description   Final    URINE, CATHETERIZED Performed at [facility], [address], [place], [place] [address]    Special Requests   Final    NONE Performed at [facility], [address], [place], [place] [address]    Culture   Final    NO GROWTH Performed at [facility], [address], [place], [place] [address]    Report Status 02/20/2020 FINAL  Final  Aerobic/Anaerobic Culture (surgical/deep wound)     Status: None (Preliminary result)   Collection Time: 02/20/20 10:04 AM   Specimen: Buttocks; Wound  Result Value Ref Range Status   Specimen Description   Final    BUTTOCKS Performed at [facility], [address] [place], [place] [address]    Special Requests   Final    Normal Performed at [facility], [address] [place], [place] [address]    Gram Stain   Final    WBC PRESENT,BOTH PMN AND MONONUCLEAR NO ORGANISMS SEEN    Culture   Final    NO GROWTH < 24 HOURS Performed at [facility], [address], [place], [place] [address]    Report Status PENDING  Incomplete  C Difficile Quick Screen w PCR reflex     Status: None   Collection Time: 02/20/20 10:38 AM   Specimen: STOOL  Result Value Ref Range Status   C Diff antigen NEGATIVE NEGATIVE Final   C Diff toxin NEGATIVE NEGATIVE Final   C Diff interpretation No C. difficile detected.  Final    Comment: Performed at [facility], [address] [place], [place] [address]     Time coordinating discharge: Over 30  minutes  SIGNED:   Lynn Ito, MD  [facility] 02/21/2020, 4:12 PM Pager   If 7PM-7AM, please contact night-coverage [email or web address] Password [email or web address]

## 2020-02-21 NOTE — Progress Notes (Signed)
New referral for [facility] received from Palliative NP Quinn Axe. TOC Misty Green aware. Patient information given to referral. Writer met in the room with patient's sister Joycelyn Schmid in the room to initiate education regarding hospice services, philosophy, team approach to care and current visitation policy with understanding voiced. Patient has been approved for transfer today, family in agreement. Hospital care team updated. Signed out of facility DNR in place in discharge packet. Report called to the hospice home. Writer to contact non emergent transport. Staff RN Maia Breslow and aide Shirlee Limerick aware. Thank you for this referral. Flo Shanks BSN, RN, [place] [phone]

## 2020-02-21 NOTE — Progress Notes (Addendum)
Brief Progress Note:  Reviewed chart and appears plans for full comfort measures once family arrives today +/- transfer to residential hospice. No surgical interventions. Recommend continued dressing changes with wet-to-dry gauze daily and PRN with soiling. We can discuss with family regarding their wishes for further wound care (vac -vs- gauze dressings).  General surgery will follow peripherally for now; please call with questions/concerns.   -- Lynden Oxford, PA-C [facility] 02/21/2020, 7:25 AM [phone] M-F: 7am - 4pm
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
## 2020-02-21 NOTE — Progress Notes (Signed)
Palliative: Mr. Pellerito is sitting up in the bed in his room.  He will briefly make but not keep eye contact, I am not sure he is able to make his needs known.,  As he does not respond to me verbally.  He appears acutely/chronically ill and frail.  His daughter [name] is at bedside body bites and sips.  We talked about Mr. Pollet's current condition, and and verifies that family would like to focus on comfort and dignity, let nature take its course.  [name] tells me that she and her aunt [name] are in agreement about how to care for [name].  She states that they would want residential hospice.  Call to sister/healthcare surrogate, [name].  [name] states that she feels Mr. Terlecki is comfortable, no symptom management needs at this time.  She states that family is agreeable to residential hospice with [facility].  [name] tells me that she is familiar with hospice home care.  Conference with attending, bedside nursing staff, hospice representative related to patient condition, needs, residential hospice referral.  Plan:  Full comfort care.  Requesting comfort and dignity at end-of-life, residential hospice with [facility].    Prognosis: 2 weeks or less is expected due to large infected wound, sepsis, white blood cell 22.4 on admit, family's desire to focus on comfort and dignity, let nature take its course.  No further antibiotics.  35 minutes Lillia Carmel, NP Palliative Medicine Team Team Phone # [phone] Greater than 50% of this time was spent counseling and coordinating care related to the above assessment and plan.

## 2020-02-23 LAB — CULTURE, BLOOD (ROUTINE X 2)

## 2020-02-24 LAB — CULTURE, BLOOD (ROUTINE X 2): Special Requests: ADEQUATE

## 2020-02-25 LAB — AEROBIC/ANAEROBIC CULTURE W GRAM STAIN (SURGICAL/DEEP WOUND)
Culture: NO GROWTH
Special Requests: NORMAL

## 2020-03-23 DEATH — deceased

## 2022-01-15 IMAGING — CT CT HEAD W/O CM
4 series · 17 of 47 positions shown, 19 images · non-contrast
Comparison: 08/28/2019

CLINICAL DATA: Altered mental status.  Unresponsive.

EXAM:
CT HEAD WITHOUT CONTRAST
TECHNIQUE: Contiguous axial images were obtained from the base of the skull
through the vertex without intravenous contrast.

[Series 3: head wo · axial · 0.42mm/px · z∈[+1261,+1406]mm · 7 of 39 slices shown, 9 images]
[im 5/39  brain]
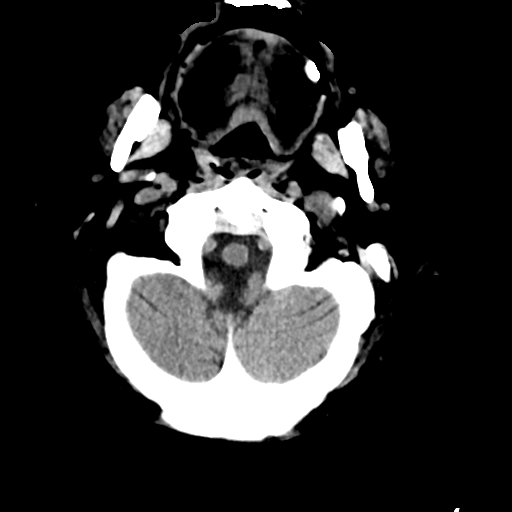
[im 5/39  bone]
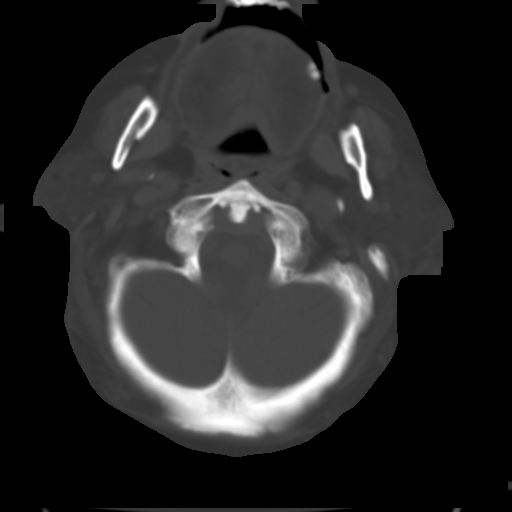
[im 10/39  brain]
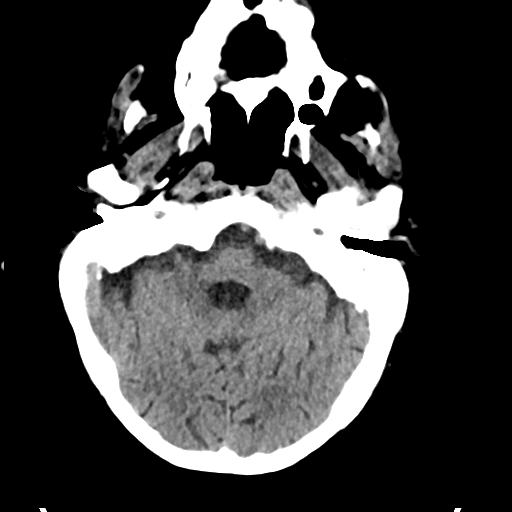
[im 15/39  brain]
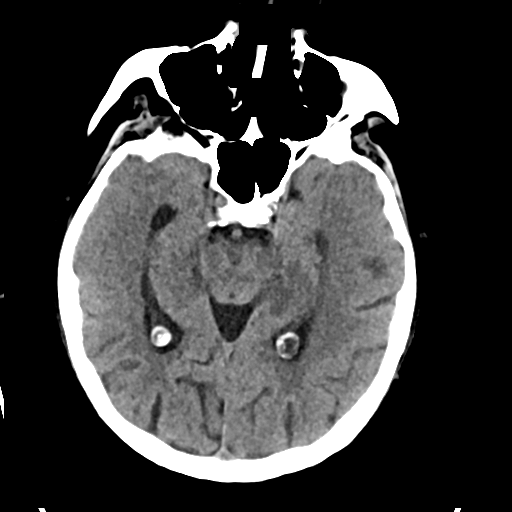
[im 20/39  brain]
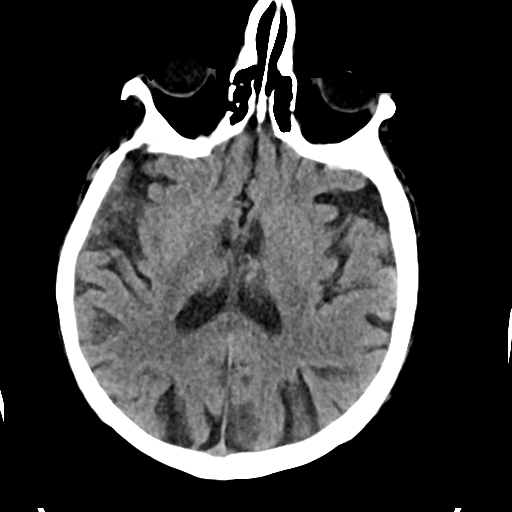
[im 24/39  brain]
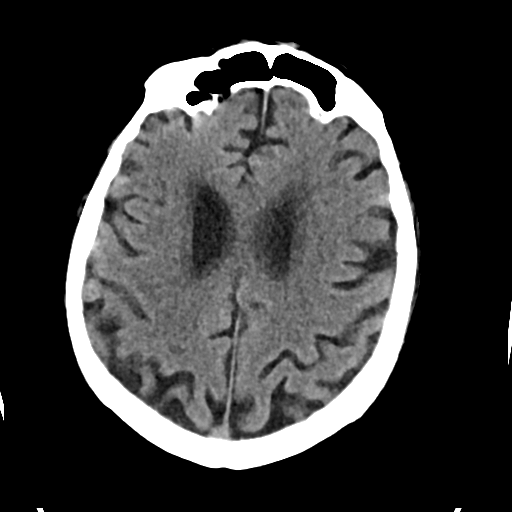
[im 24/39  bone]
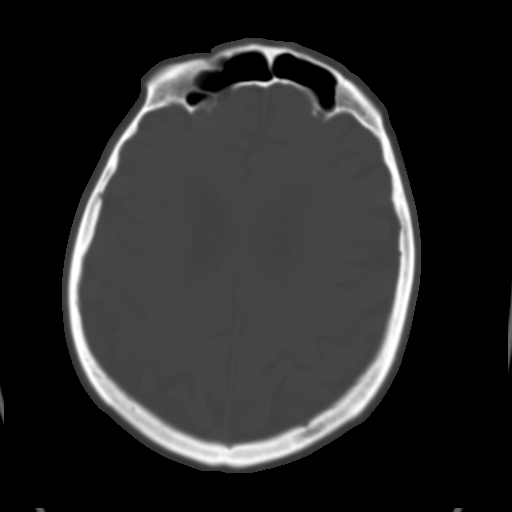
[im 29/39  brain]
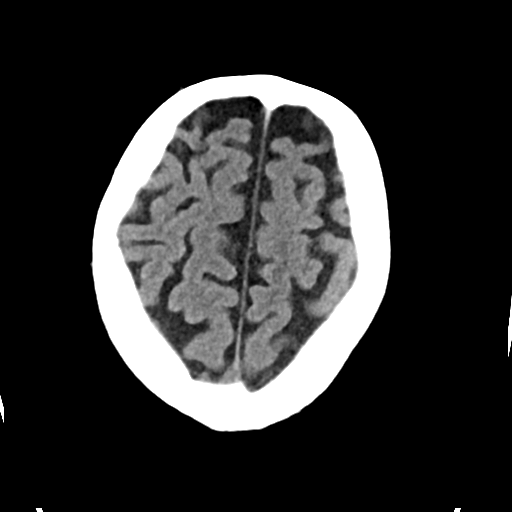
[im 34/39  brain]
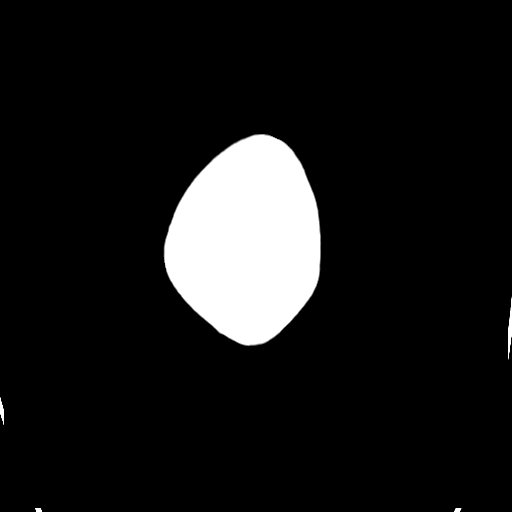

[Series 4: head bone · axial · 0.42mm/px · z∈[+1259,+1327]mm · 4 of 97 slices shown]
[im 10/97  bone]
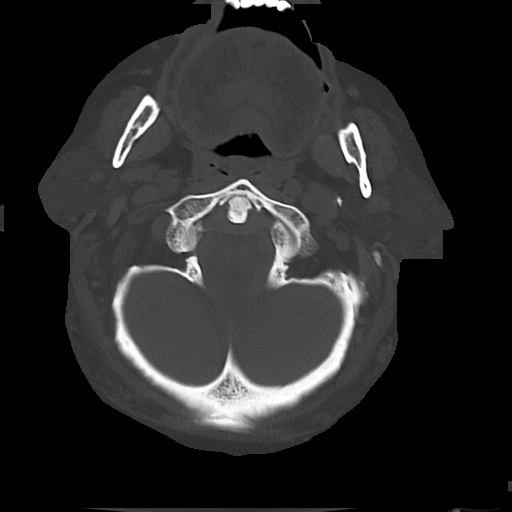
[im 20/97  bone]
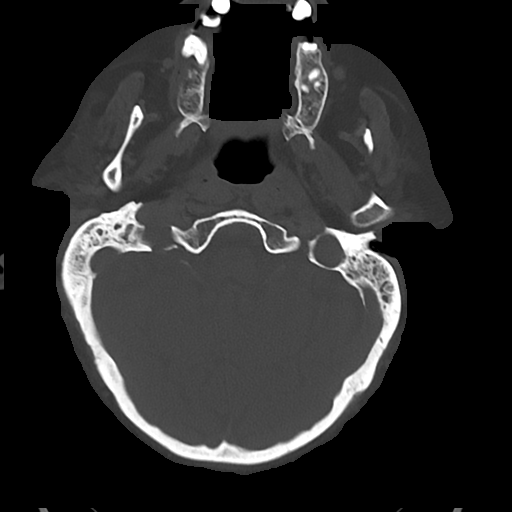
[im 29/97  bone]
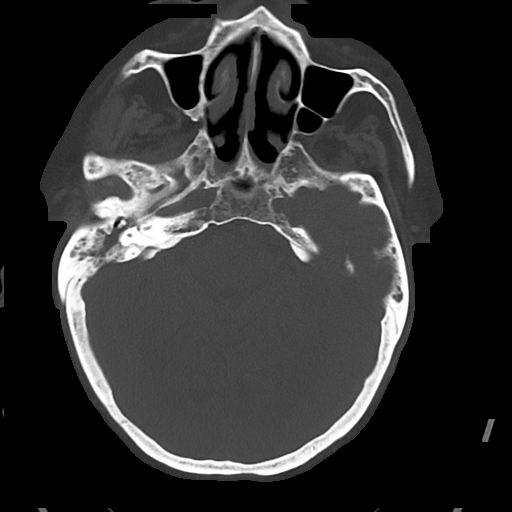
[im 44/97  bone]
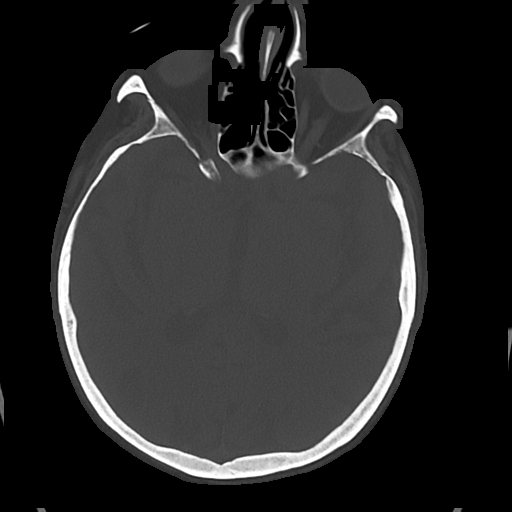

[Series 5: cor soft · coronal · 0.42mm/px · 3 of 72 slices shown]
[im 28/72  brain]
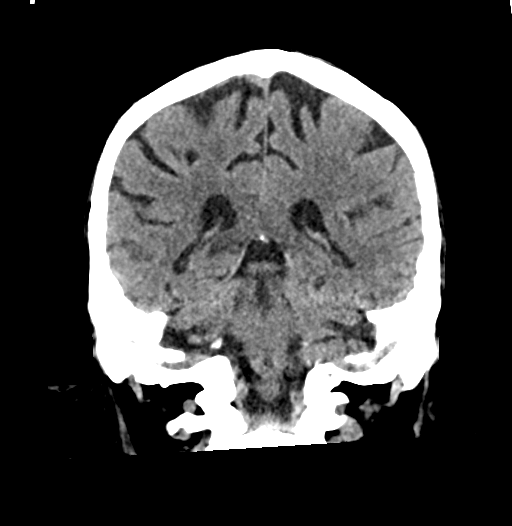
[im 34/72  brain]
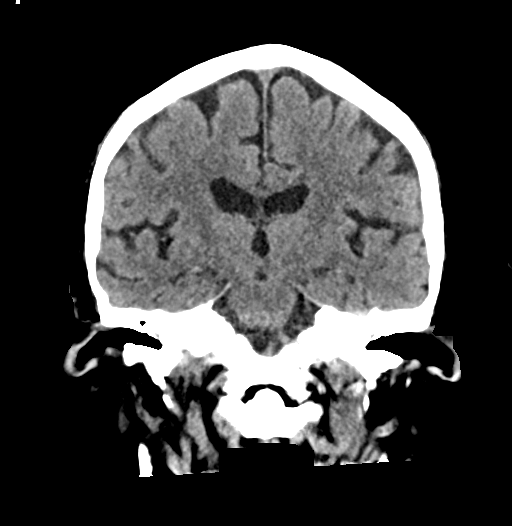
[im 40/72  brain]
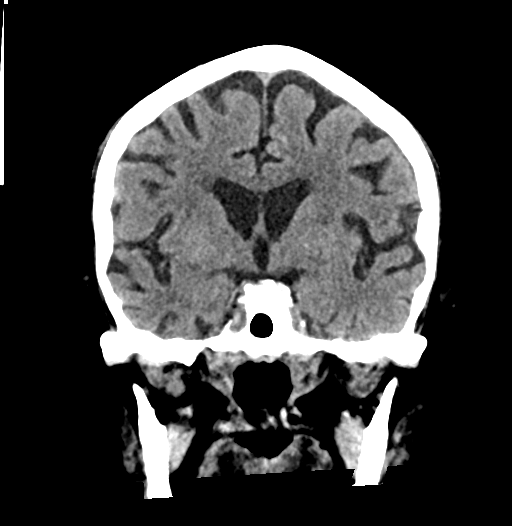

[Series 6: sag soft · sagittal · 0.49mm/px · 3 of 71 slices shown]
[im 24/71  brain]
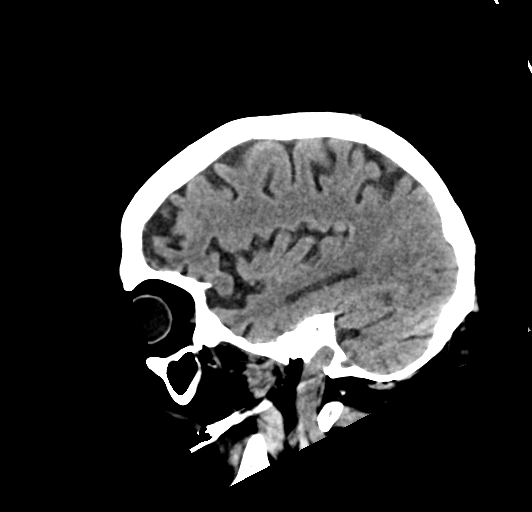
[im 36/71  brain]
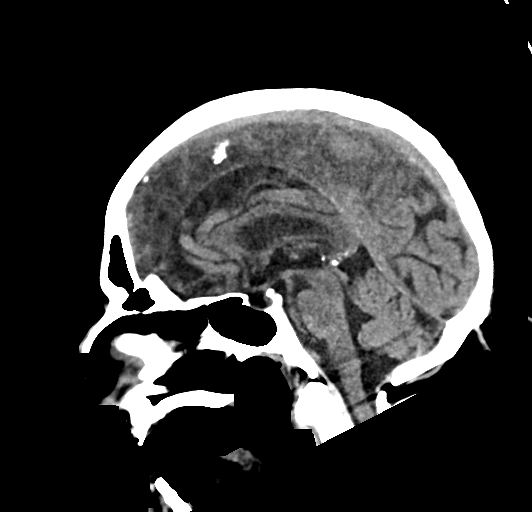
[im 47/71  brain]
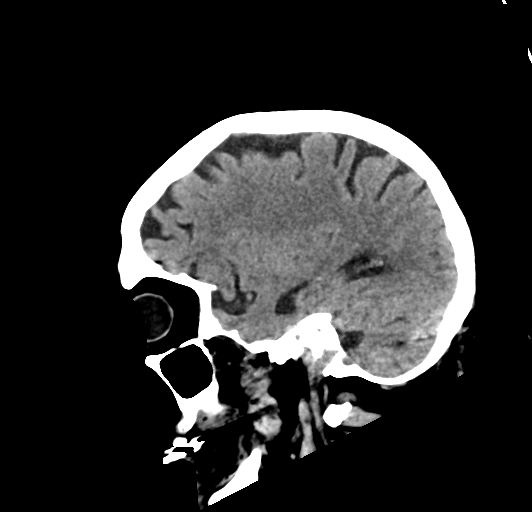

[17 of 47 positions shown; findings below may reference images not displayed]

FINDINGS: Brain: Generalized atrophy. Chronic small-vessel ischemic changes of
the white matter. Old lacunar infarction left basal ganglia. No sign
of acute infarction, mass lesion, hemorrhage, hydrocephalus or
extra-axial collection.

Vascular: There is atherosclerotic calcification of the major
vessels at the base of the brain.

Skull: Negative

Sinuses/Orbits: Clear/normal

Other: None
IMPRESSION: No acute finding by CT. Atrophy and chronic small-vessel ischemic
change of the white matter. Old left basal ganglia lacunar
infarction.
